# Patient Record
Sex: Male | Born: 1935
Health system: Southern US, Community
[De-identification: ages and names within clinical notes are randomized; demographics above are authoritative.]

## PROBLEM LIST (undated history)

## (undated) DIAGNOSIS — R011 Cardiac murmur, unspecified: Secondary | ICD-10-CM

## (undated) DIAGNOSIS — I10 Essential (primary) hypertension: Secondary | ICD-10-CM

## (undated) DIAGNOSIS — H6120 Impacted cerumen, unspecified ear: Secondary | ICD-10-CM

## (undated) DIAGNOSIS — I714 Abdominal aortic aneurysm, without rupture, unspecified: Secondary | ICD-10-CM

## (undated) DIAGNOSIS — R109 Unspecified abdominal pain: Secondary | ICD-10-CM

## (undated) DIAGNOSIS — H532 Diplopia: Secondary | ICD-10-CM

## (undated) DIAGNOSIS — B059 Measles without complication: Secondary | ICD-10-CM

## (undated) DIAGNOSIS — L609 Nail disorder, unspecified: Secondary | ICD-10-CM

## (undated) DIAGNOSIS — D649 Anemia, unspecified: Secondary | ICD-10-CM

## (undated) DIAGNOSIS — E7800 Pure hypercholesterolemia, unspecified: Secondary | ICD-10-CM

## (undated) DIAGNOSIS — Z87898 Personal history of other specified conditions: Secondary | ICD-10-CM

## (undated) DIAGNOSIS — F419 Anxiety disorder, unspecified: Secondary | ICD-10-CM

## (undated) DIAGNOSIS — B029 Zoster without complications: Secondary | ICD-10-CM

## (undated) DIAGNOSIS — N200 Calculus of kidney: Secondary | ICD-10-CM

## (undated) DIAGNOSIS — H9190 Unspecified hearing loss, unspecified ear: Secondary | ICD-10-CM

## (undated) DIAGNOSIS — L578 Other skin changes due to chronic exposure to nonionizing radiation: Secondary | ICD-10-CM

## (undated) DIAGNOSIS — M1990 Unspecified osteoarthritis, unspecified site: Secondary | ICD-10-CM

## (undated) DIAGNOSIS — A069 Amebiasis, unspecified: Secondary | ICD-10-CM

## (undated) DIAGNOSIS — I255 Ischemic cardiomyopathy: Secondary | ICD-10-CM

## (undated) DIAGNOSIS — N400 Enlarged prostate without lower urinary tract symptoms: Secondary | ICD-10-CM

## (undated) DIAGNOSIS — Z0000 Encounter for general adult medical examination without abnormal findings: Secondary | ICD-10-CM

## (undated) DIAGNOSIS — M109 Gout, unspecified: Secondary | ICD-10-CM

## (undated) DIAGNOSIS — Z8619 Personal history of other infectious and parasitic diseases: Secondary | ICD-10-CM

## (undated) DIAGNOSIS — K5730 Diverticulosis of large intestine without perforation or abscess without bleeding: Secondary | ICD-10-CM

## (undated) DIAGNOSIS — B269 Mumps without complication: Secondary | ICD-10-CM

## (undated) DIAGNOSIS — M545 Low back pain, unspecified: Secondary | ICD-10-CM

## (undated) DIAGNOSIS — I739 Peripheral vascular disease, unspecified: Secondary | ICD-10-CM

## (undated) DIAGNOSIS — K625 Hemorrhage of anus and rectum: Secondary | ICD-10-CM

## (undated) DIAGNOSIS — M25569 Pain in unspecified knee: Secondary | ICD-10-CM

## (undated) DIAGNOSIS — F411 Generalized anxiety disorder: Secondary | ICD-10-CM

## (undated) DIAGNOSIS — D126 Benign neoplasm of colon, unspecified: Secondary | ICD-10-CM

## (undated) DIAGNOSIS — K219 Gastro-esophageal reflux disease without esophagitis: Secondary | ICD-10-CM

## (undated) DIAGNOSIS — I2510 Atherosclerotic heart disease of native coronary artery without angina pectoris: Secondary | ICD-10-CM

## (undated) DIAGNOSIS — I5022 Chronic systolic (congestive) heart failure: Secondary | ICD-10-CM

## (undated) DIAGNOSIS — B019 Varicella without complication: Secondary | ICD-10-CM

## (undated) DIAGNOSIS — I25700 Atherosclerosis of coronary artery bypass graft(s), unspecified, with unstable angina pectoris: Secondary | ICD-10-CM

## (undated) HISTORY — DX: Benign prostatic hyperplasia without lower urinary tract symptoms: N40.0

## (undated) HISTORY — DX: Diverticulosis of large intestine without perforation or abscess without bleeding: K57.30

## (undated) HISTORY — DX: Benign neoplasm of colon, unspecified: D12.6

## (undated) HISTORY — PX: TONSILLECTOMY: SHX5217

## (undated) HISTORY — DX: Pure hypercholesterolemia, unspecified: E78.00

## (undated) HISTORY — DX: Impacted cerumen, unspecified ear: H61.20

## (undated) HISTORY — DX: Unspecified osteoarthritis, unspecified site: M19.90

## (undated) HISTORY — DX: Unspecified abdominal pain: R10.9

## (undated) HISTORY — DX: Gastro-esophageal reflux disease without esophagitis: K21.9

## (undated) HISTORY — DX: Ischemic cardiomyopathy: I25.5

## (undated) HISTORY — DX: Varicella without complication: B01.9

## (undated) HISTORY — DX: Essential (primary) hypertension: I10

## (undated) HISTORY — DX: Atherosclerotic heart disease of native coronary artery without angina pectoris: I25.10

## (undated) HISTORY — DX: Diplopia: H53.2

## (undated) HISTORY — DX: Low back pain, unspecified: M54.50

## (undated) HISTORY — DX: Mumps without complication: B26.9

## (undated) HISTORY — DX: Calculus of kidney: N20.0

## (undated) HISTORY — DX: Zoster without complications: B02.9

## (undated) HISTORY — PX: HEMORRHOID SURGERY: SHX153

## (undated) HISTORY — DX: Nail disorder, unspecified: L60.9

## (undated) HISTORY — DX: Personal history of other infectious and parasitic diseases: Z86.19

## (undated) HISTORY — DX: Hemorrhage of anus and rectum: K62.5

## (undated) HISTORY — DX: Pain in unspecified knee: M25.569

## (undated) HISTORY — DX: Other skin changes due to chronic exposure to nonionizing radiation: L57.8

## (undated) HISTORY — DX: Personal history of other specified conditions: Z87.898

## (undated) HISTORY — DX: Encounter for general adult medical examination without abnormal findings: Z00.00

## (undated) HISTORY — DX: Atherosclerosis of coronary artery bypass graft(s), unspecified, with unstable angina pectoris: I25.700

## (undated) HISTORY — DX: Measles without complication: B05.9

## (undated) HISTORY — DX: Unspecified hearing loss, unspecified ear: H91.90

## (undated) HISTORY — DX: Anemia, unspecified: D64.9

## (undated) HISTORY — DX: Chronic systolic (congestive) heart failure: I50.22

## (undated) HISTORY — DX: Amebiasis, unspecified: A06.9

## (undated) HISTORY — DX: Gout, unspecified: M10.9

## (undated) HISTORY — DX: Abdominal aortic aneurysm, without rupture, unspecified: I71.40

## (undated) HISTORY — DX: Abdominal aortic aneurysm, without rupture: I71.4

## (undated) HISTORY — DX: Low back pain: M54.5

## (undated) HISTORY — DX: Peripheral vascular disease, unspecified: I73.9

## (undated) HISTORY — DX: Generalized anxiety disorder: F41.1

## (undated) HISTORY — DX: Anxiety disorder, unspecified: F41.9

## (undated) HISTORY — PX: OTHER SURGICAL HISTORY: SHX169

---

## 1977-11-28 HISTORY — PX: CORONARY ARTERY BYPASS GRAFT: SHX141

## 1991-11-29 HISTORY — PX: CORONARY ARTERY BYPASS GRAFT: SHX141

## 1992-11-28 HISTORY — PX: INGUINAL HERNIA REPAIR: SUR1180

## 1994-11-28 HISTORY — PX: INGUINAL HERNIA REPAIR: SUR1180

## 2000-11-23 ENCOUNTER — Encounter: Payer: Self-pay | Admitting: Cardiology

## 2000-11-23 ENCOUNTER — Ambulatory Visit (HOSPITAL_COMMUNITY): Admission: RE | Admit: 2000-11-23 | Discharge: 2000-11-23 | Payer: Self-pay | Admitting: Cardiology

## 2004-10-12 ENCOUNTER — Ambulatory Visit: Payer: Self-pay

## 2005-02-07 ENCOUNTER — Ambulatory Visit: Payer: Self-pay | Admitting: Pulmonary Disease

## 2005-02-15 ENCOUNTER — Ambulatory Visit: Payer: Self-pay

## 2005-02-21 ENCOUNTER — Ambulatory Visit: Payer: Self-pay | Admitting: Pulmonary Disease

## 2005-04-18 ENCOUNTER — Ambulatory Visit: Payer: Self-pay | Admitting: Cardiology

## 2005-06-06 ENCOUNTER — Ambulatory Visit: Payer: Self-pay | Admitting: Cardiology

## 2005-10-05 ENCOUNTER — Ambulatory Visit: Payer: Self-pay | Admitting: Cardiology

## 2005-10-07 ENCOUNTER — Ambulatory Visit: Payer: Self-pay

## 2005-10-07 ENCOUNTER — Ambulatory Visit: Payer: Self-pay | Admitting: Cardiology

## 2005-12-29 ENCOUNTER — Ambulatory Visit: Payer: Self-pay | Admitting: Pulmonary Disease

## 2006-01-04 ENCOUNTER — Encounter: Admission: RE | Admit: 2006-01-04 | Discharge: 2006-01-04 | Payer: Self-pay | Admitting: Orthopedic Surgery

## 2006-01-23 ENCOUNTER — Ambulatory Visit: Payer: Self-pay | Admitting: Pulmonary Disease

## 2006-01-26 HISTORY — PX: OTHER SURGICAL HISTORY: SHX169

## 2006-02-01 ENCOUNTER — Ambulatory Visit: Payer: Self-pay | Admitting: Cardiology

## 2006-02-07 ENCOUNTER — Ambulatory Visit: Payer: Self-pay

## 2006-02-09 ENCOUNTER — Inpatient Hospital Stay (HOSPITAL_COMMUNITY): Admission: RE | Admit: 2006-02-09 | Discharge: 2006-02-11 | Payer: Self-pay | Admitting: Orthopedic Surgery

## 2006-03-01 ENCOUNTER — Ambulatory Visit: Payer: Self-pay | Admitting: Cardiology

## 2006-06-15 ENCOUNTER — Ambulatory Visit: Payer: Self-pay | Admitting: Cardiology

## 2006-06-28 ENCOUNTER — Ambulatory Visit: Payer: Self-pay | Admitting: Pulmonary Disease

## 2006-06-30 ENCOUNTER — Ambulatory Visit: Payer: Self-pay | Admitting: Pulmonary Disease

## 2006-08-07 ENCOUNTER — Ambulatory Visit: Payer: Self-pay | Admitting: Gastroenterology

## 2006-09-05 ENCOUNTER — Ambulatory Visit: Payer: Self-pay | Admitting: Cardiology

## 2006-09-07 ENCOUNTER — Ambulatory Visit: Payer: Self-pay | Admitting: Gastroenterology

## 2006-09-07 ENCOUNTER — Encounter (INDEPENDENT_AMBULATORY_CARE_PROVIDER_SITE_OTHER): Payer: Self-pay | Admitting: *Deleted

## 2006-10-04 ENCOUNTER — Ambulatory Visit: Payer: Self-pay

## 2006-10-04 ENCOUNTER — Ambulatory Visit: Payer: Self-pay | Admitting: Cardiology

## 2006-12-29 ENCOUNTER — Ambulatory Visit: Payer: Self-pay | Admitting: Pulmonary Disease

## 2007-01-26 ENCOUNTER — Ambulatory Visit: Payer: Self-pay | Admitting: Pulmonary Disease

## 2007-02-22 ENCOUNTER — Encounter: Admission: RE | Admit: 2007-02-22 | Discharge: 2007-02-22 | Payer: Self-pay | Admitting: Surgery

## 2007-03-01 ENCOUNTER — Ambulatory Visit: Payer: Self-pay | Admitting: Cardiology

## 2007-06-05 ENCOUNTER — Ambulatory Visit: Payer: Self-pay | Admitting: Cardiology

## 2007-06-05 LAB — CONVERTED CEMR LAB
Cholesterol: 99 mg/dL (ref 0–200)
Creatinine, Ser: 1 mg/dL (ref 0.4–1.5)
GFR calc Af Amer: 92 mL/min
GFR calc non Af Amer: 76 mL/min
Glucose, Bld: 96 mg/dL (ref 70–99)
Triglycerides: 103 mg/dL (ref 0–149)

## 2007-06-29 ENCOUNTER — Ambulatory Visit: Payer: Self-pay | Admitting: Pulmonary Disease

## 2007-09-17 ENCOUNTER — Ambulatory Visit: Payer: Self-pay | Admitting: Cardiology

## 2007-09-17 LAB — CONVERTED CEMR LAB
Basophils Relative: 0.6 % (ref 0.0–1.0)
Eosinophils Absolute: 0.1 10*3/uL (ref 0.0–0.6)
HCT: 38.9 % — ABNORMAL LOW (ref 39.0–52.0)
MCV: 98.5 fL (ref 78.0–100.0)
Monocytes Absolute: 0.6 10*3/uL (ref 0.2–0.7)
Monocytes Relative: 8.7 % (ref 3.0–11.0)
Neutro Abs: 4.4 10*3/uL (ref 1.4–7.7)
RDW: 13 % (ref 11.5–14.6)
WBC: 6.8 10*3/uL (ref 4.5–10.5)

## 2007-10-01 ENCOUNTER — Ambulatory Visit: Payer: Self-pay

## 2007-11-16 DIAGNOSIS — K219 Gastro-esophageal reflux disease without esophagitis: Secondary | ICD-10-CM | POA: Insufficient documentation

## 2007-11-16 DIAGNOSIS — I10 Essential (primary) hypertension: Secondary | ICD-10-CM

## 2007-11-16 DIAGNOSIS — Z87898 Personal history of other specified conditions: Secondary | ICD-10-CM

## 2007-11-16 DIAGNOSIS — I739 Peripheral vascular disease, unspecified: Secondary | ICD-10-CM

## 2007-11-16 DIAGNOSIS — M545 Low back pain, unspecified: Secondary | ICD-10-CM

## 2007-11-16 DIAGNOSIS — M1990 Unspecified osteoarthritis, unspecified site: Secondary | ICD-10-CM

## 2007-11-16 DIAGNOSIS — M109 Gout, unspecified: Secondary | ICD-10-CM

## 2007-11-16 DIAGNOSIS — E7800 Pure hypercholesterolemia, unspecified: Secondary | ICD-10-CM

## 2007-11-16 HISTORY — DX: Essential (primary) hypertension: I10

## 2007-11-16 HISTORY — DX: Unspecified osteoarthritis, unspecified site: M19.90

## 2007-11-16 HISTORY — DX: Peripheral vascular disease, unspecified: I73.9

## 2007-11-16 HISTORY — DX: Pure hypercholesterolemia, unspecified: E78.00

## 2007-11-16 HISTORY — DX: Personal history of other specified conditions: Z87.898

## 2007-11-16 HISTORY — DX: Low back pain, unspecified: M54.50

## 2007-11-16 HISTORY — DX: Gout, unspecified: M10.9

## 2007-11-19 ENCOUNTER — Ambulatory Visit: Payer: Self-pay | Admitting: Pulmonary Disease

## 2007-11-19 DIAGNOSIS — F411 Generalized anxiety disorder: Secondary | ICD-10-CM

## 2007-11-19 DIAGNOSIS — A069 Amebiasis, unspecified: Secondary | ICD-10-CM

## 2007-11-19 HISTORY — DX: Generalized anxiety disorder: F41.1

## 2007-11-19 HISTORY — DX: Amebiasis, unspecified: A06.9

## 2007-11-26 ENCOUNTER — Encounter: Payer: Self-pay | Admitting: Pulmonary Disease

## 2007-11-26 ENCOUNTER — Ambulatory Visit (HOSPITAL_COMMUNITY): Admission: RE | Admit: 2007-11-26 | Discharge: 2007-11-26 | Payer: Self-pay | Admitting: Pulmonary Disease

## 2007-11-30 LAB — CONVERTED CEMR LAB
CO2: 30 meq/L (ref 19–32)
Calcium: 9.8 mg/dL (ref 8.4–10.5)
Chloride: 101 meq/L (ref 96–112)
Creatinine, Ser: 1.1 mg/dL (ref 0.4–1.5)
Potassium: 4.3 meq/L (ref 3.5–5.1)
Total Protein: 7.1 g/dL (ref 6.0–8.3)

## 2007-12-17 ENCOUNTER — Telehealth (INDEPENDENT_AMBULATORY_CARE_PROVIDER_SITE_OTHER): Payer: Self-pay | Admitting: *Deleted

## 2008-01-01 ENCOUNTER — Ambulatory Visit: Payer: Self-pay | Admitting: Pulmonary Disease

## 2008-01-02 ENCOUNTER — Telehealth (INDEPENDENT_AMBULATORY_CARE_PROVIDER_SITE_OTHER): Payer: Self-pay | Admitting: *Deleted

## 2008-03-26 ENCOUNTER — Ambulatory Visit: Payer: Self-pay | Admitting: Cardiology

## 2008-07-01 ENCOUNTER — Ambulatory Visit: Payer: Self-pay | Admitting: Pulmonary Disease

## 2008-07-01 DIAGNOSIS — K5730 Diverticulosis of large intestine without perforation or abscess without bleeding: Secondary | ICD-10-CM | POA: Insufficient documentation

## 2008-07-01 DIAGNOSIS — D126 Benign neoplasm of colon, unspecified: Secondary | ICD-10-CM

## 2008-07-01 HISTORY — DX: Benign neoplasm of colon, unspecified: D12.6

## 2008-07-01 HISTORY — DX: Diverticulosis of large intestine without perforation or abscess without bleeding: K57.30

## 2008-07-04 ENCOUNTER — Ambulatory Visit: Payer: Self-pay | Admitting: Pulmonary Disease

## 2008-07-11 LAB — CONVERTED CEMR LAB
Albumin: 3.8 g/dL (ref 3.5–5.2)
Basophils Absolute: 0.1 10*3/uL (ref 0.0–0.1)
Basophils Relative: 1.1 % (ref 0.0–3.0)
Bilirubin, Direct: 0.2 mg/dL (ref 0.0–0.3)
GFR calc Af Amer: 82 mL/min
HCT: 41.2 % (ref 39.0–52.0)
HDL: 28.2 mg/dL — ABNORMAL LOW (ref >39.0)
Hemoglobin: 13.9 g/dL (ref 13.0–17.0)
LDL Cholesterol: 58 mg/dL (ref 0–99)
Lymphocytes Relative: 23.3 % (ref 12.0–46.0)
MCV: 99 fL (ref 78.0–100.0)
Monocytes Relative: 8.3 % (ref 3.0–12.0)
Platelets: 192 10*3/uL (ref 150–400)
RBC: 4.16 M/uL — ABNORMAL LOW (ref 4.22–5.81)
TSH: 2.34 microintl units/mL (ref 0.35–5.50)
Total Bilirubin: 1.3 mg/dL — ABNORMAL HIGH (ref 0.3–1.2)
Total CHOL/HDL Ratio: 3.6
Total Protein: 6.5 g/dL (ref 6.0–8.3)
Triglycerides: 73 mg/dL (ref 0–149)

## 2008-09-01 ENCOUNTER — Ambulatory Visit: Payer: Self-pay | Admitting: Pulmonary Disease

## 2008-09-23 ENCOUNTER — Encounter: Payer: Self-pay | Admitting: Pulmonary Disease

## 2008-10-28 ENCOUNTER — Ambulatory Visit: Payer: Self-pay

## 2008-10-28 ENCOUNTER — Ambulatory Visit: Payer: Self-pay | Admitting: Cardiology

## 2008-10-28 LAB — CONVERTED CEMR LAB
BUN: 17 mg/dL (ref 6–23)
Basophils Absolute: 0 10*3/uL (ref 0.0–0.1)
Basophils Relative: 0.7 % (ref 0.0–3.0)
CO2: 28 meq/L (ref 19–32)
Calcium: 9.2 mg/dL (ref 8.4–10.5)
Chloride: 103 meq/L (ref 96–112)
Creatinine, Ser: 1 mg/dL (ref 0.4–1.5)
Eosinophils Absolute: 0.2 10*3/uL (ref 0.0–0.7)
Eosinophils Relative: 3 % (ref 0.0–5.0)
GFR calc Af Amer: 92 mL/min
GFR calc non Af Amer: 76 mL/min
Glucose, Bld: 93 mg/dL (ref 70–99)
HCT: 40.9 % (ref 39.0–52.0)
Hemoglobin: 13.8 g/dL (ref 13.0–17.0)
Lymphocytes Relative: 22.9 % (ref 12.0–46.0)
MCHC: 33.8 g/dL (ref 30.0–36.0)
MCV: 99.9 fL (ref 78.0–100.0)
Monocytes Absolute: 0.5 10*3/uL (ref 0.1–1.0)
Monocytes Relative: 8.2 % (ref 3.0–12.0)
Neutro Abs: 4.2 10*3/uL (ref 1.4–7.7)
Neutrophils Relative %: 65.2 % (ref 43.0–77.0)
Platelets: 182 10*3/uL (ref 150–400)
Potassium: 4 meq/L (ref 3.5–5.1)
RBC: 4.09 M/uL — ABNORMAL LOW (ref 4.22–5.81)
RDW: 13.1 % (ref 11.5–14.6)
Sodium: 138 meq/L (ref 135–145)
WBC: 6.3 10*3/uL (ref 4.5–10.5)

## 2008-11-13 ENCOUNTER — Ambulatory Visit: Payer: Self-pay | Admitting: Cardiology

## 2008-11-28 LAB — HM COLONOSCOPY: HM Colonoscopy: NORMAL

## 2008-12-30 ENCOUNTER — Ambulatory Visit: Payer: Self-pay | Admitting: Pulmonary Disease

## 2009-01-05 ENCOUNTER — Telehealth: Payer: Self-pay | Admitting: Pulmonary Disease

## 2009-01-14 ENCOUNTER — Telehealth: Payer: Self-pay | Admitting: Pulmonary Disease

## 2009-01-15 ENCOUNTER — Telehealth (INDEPENDENT_AMBULATORY_CARE_PROVIDER_SITE_OTHER): Payer: Self-pay | Admitting: *Deleted

## 2009-01-15 ENCOUNTER — Encounter: Payer: Self-pay | Admitting: Pulmonary Disease

## 2009-03-25 ENCOUNTER — Encounter: Payer: Self-pay | Admitting: Pulmonary Disease

## 2009-05-22 ENCOUNTER — Telehealth: Payer: Self-pay | Admitting: Cardiology

## 2009-05-25 ENCOUNTER — Telehealth: Payer: Self-pay | Admitting: Pulmonary Disease

## 2009-05-27 ENCOUNTER — Telehealth: Payer: Self-pay | Admitting: Pulmonary Disease

## 2009-05-27 ENCOUNTER — Encounter: Payer: Self-pay | Admitting: Pulmonary Disease

## 2009-05-27 ENCOUNTER — Ambulatory Visit: Payer: Self-pay | Admitting: Cardiology

## 2009-06-02 ENCOUNTER — Telehealth: Payer: Self-pay | Admitting: Cardiology

## 2009-06-03 ENCOUNTER — Telehealth: Payer: Self-pay | Admitting: Cardiology

## 2009-07-01 ENCOUNTER — Ambulatory Visit: Payer: Self-pay | Admitting: Pulmonary Disease

## 2009-07-03 LAB — CONVERTED CEMR LAB
AST: 36 units/L (ref 0–37)
Alkaline Phosphatase: 73 units/L (ref 39–117)
Basophils Absolute: 0 10*3/uL (ref 0.0–0.1)
Calcium: 9.6 mg/dL (ref 8.4–10.5)
Chloride: 108 meq/L (ref 96–112)
Cholesterol: 106 mg/dL (ref 0–200)
GFR calc non Af Amer: 75.81 mL/min (ref >60)
HCT: 42.4 % (ref 39.0–52.0)
HDL: 36 mg/dL — ABNORMAL LOW (ref >39.00)
Hemoglobin: 14.4 g/dL (ref 13.0–17.0)
LDL Cholesterol: 60 mg/dL (ref 0–99)
Lymphocytes Relative: 22.3 % (ref 12.0–46.0)
Lymphs Abs: 1.4 10*3/uL (ref 0.7–4.0)
MCHC: 34 g/dL (ref 30.0–36.0)
Monocytes Relative: 8.7 % (ref 3.0–12.0)
Neutro Abs: 4.1 10*3/uL (ref 1.4–7.7)
Neutrophils Relative %: 65.9 % (ref 43.0–77.0)
Platelets: 218 10*3/uL (ref 150.0–400.0)
RBC: 4.33 M/uL (ref 4.22–5.81)
Total Bilirubin: 1 mg/dL (ref 0.3–1.2)
Total CHOL/HDL Ratio: 3
WBC: 6.1 10*3/uL (ref 4.5–10.5)

## 2009-07-06 ENCOUNTER — Telehealth (INDEPENDENT_AMBULATORY_CARE_PROVIDER_SITE_OTHER): Payer: Self-pay | Admitting: *Deleted

## 2009-09-23 ENCOUNTER — Encounter: Payer: Self-pay | Admitting: Pulmonary Disease

## 2009-10-07 ENCOUNTER — Encounter: Payer: Self-pay | Admitting: Pulmonary Disease

## 2009-10-21 ENCOUNTER — Encounter: Payer: Self-pay | Admitting: Pulmonary Disease

## 2009-10-26 ENCOUNTER — Encounter: Payer: Self-pay | Admitting: Pulmonary Disease

## 2009-11-04 ENCOUNTER — Ambulatory Visit: Payer: Self-pay

## 2009-11-04 ENCOUNTER — Encounter: Payer: Self-pay | Admitting: Cardiology

## 2009-11-04 DIAGNOSIS — I714 Abdominal aortic aneurysm, without rupture: Secondary | ICD-10-CM

## 2009-11-10 ENCOUNTER — Encounter (INDEPENDENT_AMBULATORY_CARE_PROVIDER_SITE_OTHER): Payer: Self-pay | Admitting: *Deleted

## 2009-11-11 ENCOUNTER — Ambulatory Visit: Payer: Self-pay | Admitting: Cardiology

## 2009-12-21 ENCOUNTER — Encounter: Payer: Self-pay | Admitting: Pulmonary Disease

## 2010-02-03 ENCOUNTER — Ambulatory Visit: Payer: Self-pay | Admitting: Pulmonary Disease

## 2010-02-03 ENCOUNTER — Encounter (INDEPENDENT_AMBULATORY_CARE_PROVIDER_SITE_OTHER): Payer: Self-pay | Admitting: *Deleted

## 2010-02-03 DIAGNOSIS — R109 Unspecified abdominal pain: Secondary | ICD-10-CM

## 2010-02-03 DIAGNOSIS — H6120 Impacted cerumen, unspecified ear: Secondary | ICD-10-CM | POA: Insufficient documentation

## 2010-02-03 HISTORY — DX: Unspecified abdominal pain: R10.9

## 2010-02-03 LAB — CONVERTED CEMR LAB
ALT: 28 units/L (ref 0–53)
AST: 33 units/L (ref 0–37)
Albumin: 4 g/dL (ref 3.5–5.2)
Alkaline Phosphatase: 71 units/L (ref 39–117)
CO2: 31 meq/L (ref 19–32)
Chloride: 111 meq/L (ref 96–112)
Creatinine, Ser: 1.1 mg/dL (ref 0.4–1.5)
Eosinophils Absolute: 0.3 10*3/uL (ref 0.0–0.7)
Eosinophils Relative: 3.9 % (ref 0.0–5.0)
GFR calc non Af Amer: 67.81 mL/min (ref >60)
Lymphocytes Relative: 25 % (ref 12.0–46.0)
MCV: 100.9 fL — ABNORMAL HIGH (ref 78.0–100.0)
Neutro Abs: 3.9 10*3/uL (ref 1.4–7.7)
Neutrophils Relative %: 60.1 % (ref 43.0–77.0)
RDW: 13.6 % (ref 11.5–14.6)
Sed Rate: 18 mm/hr (ref 0–22)
Sodium: 143 meq/L (ref 135–145)
Total Protein: 7.1 g/dL (ref 6.0–8.3)

## 2010-02-24 ENCOUNTER — Telehealth: Payer: Self-pay | Admitting: Gastroenterology

## 2010-02-26 ENCOUNTER — Ambulatory Visit: Payer: Self-pay | Admitting: Gastroenterology

## 2010-02-26 ENCOUNTER — Encounter (INDEPENDENT_AMBULATORY_CARE_PROVIDER_SITE_OTHER): Payer: Self-pay | Admitting: *Deleted

## 2010-03-02 ENCOUNTER — Telehealth: Payer: Self-pay | Admitting: Gastroenterology

## 2010-03-08 ENCOUNTER — Ambulatory Visit: Payer: Self-pay | Admitting: Gastroenterology

## 2010-04-29 ENCOUNTER — Ambulatory Visit: Payer: Self-pay | Admitting: Cardiology

## 2010-06-22 ENCOUNTER — Encounter: Payer: Self-pay | Admitting: Pulmonary Disease

## 2010-08-09 ENCOUNTER — Telehealth (INDEPENDENT_AMBULATORY_CARE_PROVIDER_SITE_OTHER): Payer: Self-pay | Admitting: *Deleted

## 2010-08-12 ENCOUNTER — Ambulatory Visit: Payer: Self-pay | Admitting: Pulmonary Disease

## 2010-11-09 ENCOUNTER — Telehealth: Payer: Self-pay | Admitting: Gastroenterology

## 2010-11-10 ENCOUNTER — Telehealth (INDEPENDENT_AMBULATORY_CARE_PROVIDER_SITE_OTHER): Payer: Self-pay | Admitting: *Deleted

## 2010-11-16 ENCOUNTER — Encounter: Payer: Self-pay | Admitting: Cardiology

## 2010-11-17 ENCOUNTER — Ambulatory Visit: Payer: Self-pay

## 2010-11-17 ENCOUNTER — Encounter: Payer: Self-pay | Admitting: Cardiology

## 2010-12-07 ENCOUNTER — Ambulatory Visit
Admission: RE | Admit: 2010-12-07 | Discharge: 2010-12-07 | Payer: Self-pay | Source: Home / Self Care | Attending: Gastroenterology | Admitting: Gastroenterology

## 2010-12-22 ENCOUNTER — Encounter: Payer: Self-pay | Admitting: Pulmonary Disease

## 2010-12-28 NOTE — Assessment & Plan Note (Signed)
History of Present Illness Visit Type: Initial Consult Primary GI MD: Rob Bunting MD Primary Provider: Alroy Dust, MD Chief Complaint: constipation and explosive diarrhea at times, dysphagia History of Present Illness:     very pleasant 77 year old man who has been having trouble with intermittent fecal urgency/incontinence at times.  This has been going on for about 6 months to 1 year.  This happens every other day.  He has not noticed bleeding.    This past weekend, he began with  Mucous discharge.  There was some bleeding.  Was constipated, had very scibbolous stools.  Since then he has had abnormal stools (size, shape different).  There is a groove on his stools about 1/8 inch deep, 3/16 inch wide.  He had some discomfort in lower pelvic area.  He had noted red blood on the TP the size of a postage stamp.  Overall his weight is stable (with some fluctuance).  Has never tried fiber supplements.    he had a basic set of labs including a CBC, basic metabolic profile, sedimentation rate and thyroid testing  one month ago and this was normal.           Current Medications (verified): 1)  Adult Aspirin Ec Low Strength 81 Mg  Tbec (Aspirin) .... Take 1 Tablet By Mouth Once A Day 2)  Benicar 20 Mg Tabs (Olmesartan Medoxomil) .... Take 1/2  Tab As Directed... 3)  Simvastatin 40 Mg  Tabs (Simvastatin) .... Take 1 Tablet By Mouth Once A Day 4)  Allopurinol 300 Mg  Tabs (Allopurinol) .... Take 1 Tablet By Mouth Once A Day 5)  Osteo Bi-Flex Adv Joint Shield   Tabs (Misc Natural Products) .... Take One Tablet By Mouth Two Times A Day 6)  Centrum Silver   Tabs (Multiple Vitamins-Minerals) .... Take 1 Tablet By Mouth Once A Day 7)  Nitrostat 0.4 Mg Subl (Nitroglycerin) .... As Directed... 8)  Folic Acid 400 Mcg Tabs (Folic Acid) .... Take 1 Tablet By Mouth Two Times A Day 9)  Avodart 0.5 Mg Caps (Dutasteride) .... Take As Directed By [name]...  Allergies (verified): 1)  !  Methocarbamol (Methocarbamol) 2)  ! Lyrica (Pregabalin) 3)  ! Lisinopril 4)  ! 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.4) HYPERTENSION (ICD-401.9) CAD (ICD-414.00) PERIPHERAL VASCULAR DISEASE (ICD-443.9) ABDOMINAL AORTIC ANEURYSM (ICD-441.4) HYPERCHOLESTEROLEMIA (ICD-272.0) GERD (ICD-530.81) DIVERTICULOSIS OF COLON (ICD-562.10) Hx of FLANK PAIN, RIGHT (ICD-789.09) Hx of AMEBIC DYSENTERY (ICD-006.9) BENIGN PROSTATIC HYPERTROPHY, HX OF (ICD-V13.8) DEGENERATIVE JOINT DISEASE (ICD-715.90) GOUT (ICD-274.9) BACK PAIN, LUMBAR (ICD-724.2) ANXIETY (ICD-300.00)  Past Surgical History: S/P CABG x 4 in 1979 S/P redo CABG x5 1993 by drWilson S/P right inguinal hernia repair in 1994 by DrGerkin S/P left inguinal hernia repair in 1996 by DrGerkin S/P decompressive laminectomy L2-sacrum 3/07 by [name]   Family History: Mother deceased age unknown---drowning Father deceased age unknown from old age 72 brother deceased age 80 from parkinsons disease 1 sister deceased age 31 from a fall from a building   Social History: retired quit smoking in 1946 2 drinks per day married 7 children  former [employer]. 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  All other review of systems was otherwise negative.   Vital Signs:  Patient profile:   77 year old male Height:  72 inches Weight:      177.50 pounds Pulse rate:   60 / minute Pulse rhythm:   regular BP sitting:   118 / 58  (left arm)  Vitals Entered By: Milford Cage NCMA (February 26, 2010 9:27 AM)  Physical Exam  Additional Exam:  Constitutional: generally well appearing Psychiatric: alert and oriented times 3 Eyes: extraocular movements intact Mouth: oropharynx moist, no lesions Neck: supple, no lymphadenopathy Cardiovascular: heart regular rate and  rythm Lungs: CTA bilaterally Abdomen: soft, non-tender, non-distended, no obvious ascites, no peritoneal signs, normal bowel sounds Extremities: no lower extremity edema bilaterally Skin: no lesions on visible extremities    Impression & Recommendations:  Problem # 1:  Change in bowel habits, constipation, intermittent fecal incontinence first he had a colonoscopy about 4 years ago. A polyp was described, removed by Dr. Victorino Dike biopsy showed this was not adenomatous or hyperplastic. It is not clear why he was recommended to have a repeat colonoscopy at 4 year interval.  that being said he has had fecal incontinence for about a year as well as a real change in his bowel habits just this past weekend. Perhaps backing off on caffeine has made a difference and cause him to be a bit more constipated recently. I think fiber supplements may help even out his bowels and I have recommended he start those. He should also undergo full colonoscopy at his soonest convenience.  Patient Instructions: 1)  You will be scheduled to have a colonoscopy. 2)  You should begin taking citrucel powder fiber supplement (orange flavor).  Start with a small spoonful and increase this over 1 week to a full, heaping spoonful daily.  You may notice some bloating when you first start the fiber, but that usually resolves after a few days. 3)  A copy of this information will be sent to Dr. Kriste Basque. 4)  The medication list was reviewed and reconciled.  All changed / newly prescribed medications were explained.  A complete medication list was provided to the patient / caregiver.  Appended Document: Orders Update/movi    Clinical Lists Changes  Problems: Added new problem of OTHER CONSTIPATION (ICD-564.09) Medications: Added new medication of MOVIPREP 100 GM  SOLR (PEG-KCL-NACL-NASULF-NA ASC-C) As per prep instructions. - Signed Rx of MOVIPREP 100 GM  SOLR (PEG-KCL-NACL-NASULF-NA ASC-C) As per prep instructions.;  #1 x 0;   Signed;  Entered by: Chales Abrahams CMA (AAMA);  Authorized by: Rachael Fee MD;  Method used: Electronically to [facility] [address] [place], [place], [place]  [address], Ph: [phone], Fax: [phone] Orders: Added new Test order of Colonoscopy (Colon) - Signed    Prescriptions: MOVIPREP 100 GM  SOLR (PEG-KCL-NACL-NASULF-NA ASC-C) As per prep instructions.  #1 x 0   Entered by:   Chales Abrahams CMA (AAMA)   Authorized by:   Rachael Fee MD   Signed by:   Chales Abrahams CMA (AAMA) on 02/26/2010   Method used:   Electronically to        [facility] (retail)       [address]       [place], [place]  [address]       Ph: [phone]       Fax: [phone]   RxID:   [id number]

## 2010-12-28 NOTE — Medication Information (Signed)
Summary: Benicar approval til 01/14/11/[employer]  Benicar approval til 01/14/11/[employer]   Imported By: Lester [place] 12/01/2009 10:22:21  _____________________________________________________________________  External Attachment:    Type:   Image     Comment:   External Document

## 2010-12-28 NOTE — Letter (Signed)
Summary: New Patient letter  [facility]  [address] [place], [place] [address]   Phone: [phone]  Fax: [phone]       02/03/2010 MRN: [id number]  Daniel Reeves [address] [place], [place]  [address]  Dear Mr. Pizzo,  Welcome to the Gastroenterology Division at [facility].    You are scheduled to see Dr.  Christella Hartigan on 03-03-10 at 2:30p.m. on the 3rd floor at [facility], [address]  We ask that you try to arrive at our office 15 minutes prior to your appointment time to allow for check-in.  We would like you to complete the enclosed self-administered evaluation form prior to your visit and bring it with you on the day of your appointment.  We will review it with you.  Also, please bring a complete list of all your medications or, if you prefer, bring the medication bottles and we will list them.  Please bring your insurance card so that we may make a copy of it.  If your insurance requires a referral to see a specialist, please bring your referral form from your primary care physician.  Co-payments are due at the time of your visit and may be paid by cash, check or credit card.     Your office visit will consist of a consult with your physician (includes a physical exam), any laboratory testing he/she may order, scheduling of any necessary diagnostic testing (e.g. x-ray, ultrasound, CT-scan), and scheduling of a procedure (e.g. Endoscopy, Colonoscopy) if required.  Please allow enough time on your schedule to allow for any/all of these possibilities.    If you cannot keep your appointment, please call [phone] to cancel or reschedule prior to your appointment date.  This allows [place] the opportunity to schedule an appointment for another patient in need of care.  If you do not cancel or reschedule by 5 p.m. the business day prior to your appointment date, you will be charged a $50.00 late cancellation/no-show fee.    Thank you for choosing  [facility] for your medical needs.  We appreciate the opportunity to care for you.  Please visit [place] at our website  to learn more about our practice.                     Sincerely,                                                             The Gastroenterology Division

## 2010-12-28 NOTE — Progress Notes (Signed)
Summary: Nitrostat refill request-lmomtcb x 1  Phone Note Call from Patient Call back at Home Phone [phone]   Caller: Patient Call For: nadel Summary of Call: Nitroquik refill requested - Rx expired in 05/11 - Pharmacy says they requested by fax twice already. [facility] [place] [facility] Initial call taken by: Eugene Gavia,  August 09, 2010 9:57 AM  Follow-up for Phone Call        [facility] x 1. Need to confirm medication and pharmacy with pt. Zackery Barefoot CMA  August 09, 2010 10:39 AM   Additional Follow-up for Phone Call Additional follow up Details #1::        Spoke with pt and notified rx was sent.  I advised that cards should refill this next time and pt verbalized understanding. Additional Follow-up by: Vernie Murders,  August 09, 2010 2:23 PM    Prescriptions: NITROSTAT 0.4 MG SUBL (NITROGLYCERIN) as directed...  #25 x 0   Entered by:   Vernie Murders   Authorized by:   Michele Mcalpine MD   Signed by:   Vernie Murders on 08/09/2010   Method used:   Electronically to        [facility] (retail)       [address]       [place], [place]  [address]       Ph: [phone]       Fax: [phone]   RxID:   [id number]

## 2010-12-28 NOTE — Progress Notes (Signed)
Summary: Triage  Phone Note Call from Patient Call back at Home Phone [phone]   Caller: Patient Call For: Dr. Christella Hartigan Reason for Call: Talk to Nurse Summary of Call: pt going on 3day/500 mile trip the day after procedure on 03-08-10. Wants to know if it will be okay to travel that distance after procedure Initial call taken by: Karna Christmas,  March 02, 2010 9:56 AM  Follow-up for Phone Call        pt advised he could go on his trip, he should be fine for travel barring any complications during the procedure Follow-up by: Chales Abrahams CMA Duncan Dull),  March 02, 2010 10:09 AM

## 2010-12-28 NOTE — Assessment & Plan Note (Signed)
Summary: f/u 6 months///kp   CC:  7 month ROV & review of mult medical problems....  History of Present Illness: 77 y/o WM here for a follow up visit... he has multiple medical problems as noted below...    ~  Aug09:  he has been doing reasonably well without new complaints or concerns except for some dysphagia- he notes difficulty swallowing large pills eg- Glucosamine... I offered him a GI referral for EGD but he declines at present... he will try other strategies including cutting up his larger pills, etc...  ~  Oct09:  saw [name] w/ f/u PSA in the 8-10 range & improved by elim white foods from his diet.  ~  Dec09:  saw [name] for f/u LBBB, CAD- s/p CABG, AAA, Chol... doing well, same meds...   ~  December 30, 2008:  he has several complaints today- 1) nocturnal leg cramps: he's tried several OTC meds w/o relief... we discussed trial of Tonic water in increasing amts, &/or Mustard...  2) IBS acting up & he has fecal incontinence- he is quite sure that "my striangulated intestines shift & block my rectum"... I offered him a GI referral for further eval, vs trial of Miralax + Senakot-S (he prefers the latter).   ~  July 01, 2009:  he's had a good 4mo "couldn't be better" he says... he fell several weeks ago in his garden & injured left ribs- CXR at [facility] was neg- post CABG, Ao calcif, clear lungs... he last saw [name] 12/09- doing well, no changes made... he notes sl dizzy from the Benicar, therefore he decr to 1/2 tab & monitors his BP at home... he is followed by [name] every 4mo w/ rising PSA on watchful waiting protocol... no new complaints or concerns...   ~  February 03, 2010:  he states that "I'm falling apart"- c/o DOE w/ min activity yet doing yard work & just ret from [employer]... his BP is sl lower than usual on Benicar20/d & we discussed decr to 1/2 tab...  he saw [name] 12/10- doing well, no changes made... he saw [name] 8/10 & 1/11- started on AVODART for incr PSA & it  dropped from 11.7 to 4.3 on the med but c/o fatigue, weak, breast tenderness etc... they decided to decr to Qod, now Q3rd day, & feeling sl better... we discussed checking labs today for completeness...  also c/o ?glass shard in finger but I couldn't detect foreing body- he will hot soak it & try to work it out vs call DrSypher for check... finally he is c/o mucous in his stool & fecal incontinence (see above)- prev offered GI referral & he is ready for this now...   Current Problem List:  HYPERTENSION (ICD-401.9) - on BENICAR 20mg /d at present... BP today= 112/58, he monitors BP at home & we discussed decr to 1/2 tab daily... denies HA, visual changes, CP, palipit, dizziness, syncope, edema, etc...   CAD (ICD-414.00) - he takes ASA 81mg /d... followed by [name] w/ hx of 2 prev CABG's in 1979 & 1993...   ~  NuclearStressTest 3/07 showed infer infarct & mild peri-infarct ischemia, abn wall motion & EF=45%... essentially no change from 2003  ~  last check up by [name] was 12/10- [facility], doing [facility]is, no changes made.  PERIPHERAL VASCULAR DISEASE (ICD-443.9) - he takes the Aspirin 81mg /d... known  ~3cm AAA being followed serially...  ~  ABI's 3/06 were normal   ~  CT Abd 12/08  w/ 3.3cm focal saccular AAA in distal abd ao. ((it was 3cm in 05)...  ~  AbdAo Doppler 11/08 w/ stable infra-renal fusiform aneurysm  ~ 3cm...  ~  AbdAo Doppler 12/10 showed stable  ~3cm AAA, mod irreg plaque, norm iliacs... f/u 43yr.  HYPERCHOLESTEROLEMIA (ICD-272.0) - on SIMVASTATIN 40mg /d...  ~  FLP 7/08 showed TChol 99, TG 103, HDL 32, LDL 46  ~  FLP 8/09 showed TChol 101, TG 73, HDL 28, LDL 58  ~  FLP 8/10 showed TChol 106, TG 50, HDL 36, LDL 60  GERD (ICD-530.81) - uses OTC meds as needed... last EGD 10/07 by DrSam showed sm HH otherw neg...   ~ 8/09: noted some dysphagia for large pills, offered GI referral but he declined.  DIVERTICULOSIS OF COLON (ICD-562.10) & COLONIC POLYPS (ICD-211.3)  ~   colonoscopy 10/07 by DrSam showed divertics, and 7mm polyp= polypoid mucosa on path... f/u 52yrs planned...  ~  2/10: noted some IBS symptoms and fecal incontinence, again offered GI referral but he declined.  ~  3/11: now he is ready to discuss fecal incontinence w/ gastroenterology...  FLANK PAIN, RIGHT (ICD-789.09) - he was seen 12/08 c/o Rt flank pain of 6 months duration... he had seen [name] and was unhappy w/ his eval, concerned about kidney stone vs recurrence of amebic dysentery... 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 BoneScan= arthritic uptake all over but none in rt flank area, ribs, etc...  Rx w/ heating pad and ETODOLAC... he reports that his discomfort pretty much resolved and he felt good about the thorough evaluation...  ~  8/10:  prev right flank pain resolved- no recurrence.  Hx of AMEBIC DYSENTERY (ICD-006.9)  BENIGN PROSTATIC HYPERTROPHY, HX OF (ICD-V13.8) - hx of increased PSA in the 6-8 range and followed by [name]... he has LTOS including the fact that it takes him 12 min to urinate and empty satisfactorily... he has nocturia x 2-3... he tells me that [name] didn't want to place him on meds, rather he rec a diet for his prostate health w/ fruits + vegetables, low fat, & no white foods like rice etc... his voiding symptoms are improved on this diet, he says... pt has refused cysto & bx of prostate, they are following his PSA's Q24mo.  ~  2010: [name] started Avodart when PSA hit 11.7 & it improved to 4.3, but pt c/o fatigue, weak, breast tenderness- so they are decreasing the Avodart to Qod, now Q3rd day (w/ sl improvement in symptoms).  DEGENERATIVE JOINT DISEASE (ICD-715.90) - he takes OSTEOBIFLEX Bid, + MVI, etc... he manages his mod severe arthritis pain very well and only uses an occas Advil... prev had Etodolac, Tramadol,  DCN100- but stopped all of these...  GOUT (ICD-274.9) - on ALLOPURINOL 300mg /d...   ~  labs 2/09 showed Uric = 3.8  ~  labs 8/10 showed Uric = 3.8  BACK PAIN, LUMBAR (ICD-724.2) - s/p decompressive laminectomy 3/07 by [name]...  ANXIETY (ICD-300.00)   Allergies: 1)  ! Methocarbamol (Methocarbamol) 2)  ! Lyrica (Pregabalin) 3)  ! Lisinopril 4)  ! Ramipril  Comments:  Nurse/Medical Assistant: The patient's medications and allergies were reviewed with the patient and were updated in the Medication and Allergy Lists.  Past History:  Past Medical History:  Hx of CERUMEN IMPACTION, BILATERAL (ICD-380.4) HYPERTENSION (ICD-401.9) CAD (ICD-414.00) PERIPHERAL VASCULAR DISEASE (ICD-443.9) ABDOMINAL  AORTIC ANEURYSM (ICD-441.4) HYPERCHOLESTEROLEMIA (ICD-272.0) GERD (ICD-530.81) DIVERTICULOSIS OF COLON (ICD-562.10) COLONIC POLYPS (ICD-211.3) Hx of FLANK PAIN, RIGHT (ICD-789.09) Hx of AMEBIC DYSENTERY (ICD-006.9) BENIGN PROSTATIC HYPERTROPHY, HX OF (ICD-V13.8) DEGENERATIVE JOINT DISEASE (ICD-715.90) GOUT (ICD-274.9) BACK PAIN, LUMBAR (ICD-724.2) ANXIETY (ICD-300.00)  Past Surgical History: S/P CABG x 4 in 1979 S/P redo CABG x5 1993 by drWilson S/P right inguinal hernia repair in 1994 by DrGerkin S/P left inguinal hernia repair in 1996 by DrGerkin S/P decompressive laminectomy L2-sacrum 3/07 by [name]  Family History: Reviewed history from 11/05/2009 and no changes required. Mother deceased age Suzanne Boron Father deceased age unknown from old age 61 brother deceased age 42 from parkinsons disease 1 sister deceased age 22 from a fall from a building  Social History: Reviewed history from 11/05/2009 and no changes required. retired quit smoking in 1946 2 drinks per day married 7 children  Review of Systems      See HPI       The patient complains of decreased hearing, dyspnea on exertion, and muscle weakness.  The patient denies anorexia, fever,  weight loss, weight gain, vision loss, hoarseness, chest pain, syncope, peripheral edema, prolonged cough, headaches, hemoptysis, abdominal pain, melena, hematochezia, severe indigestion/heartburn, hematuria, incontinence, suspicious skin lesions, transient blindness, difficulty walking, depression, unusual weight change, abnormal bleeding, enlarged lymph nodes, and angioedema.    Vital Signs:  Patient profile:   77 year old male Height:      72 inches Weight:      182.13 pounds O2 Sat:      95 % on Room air Temp:     97.5 degrees F oral Pulse rate:   59 / minute BP sitting:   112 / 58  (left arm) Cuff size:   regular  Vitals Entered By: Randell Loop CMA (February 03, 2010 2:26 PM)  O2 Sat at Rest %:  95 O2 Flow:  Room air CC: 7 month ROV & review of mult medical problems... Is Patient Diabetic? No Pain Assessment Patient in pain? yes      Comments meds updated today   Physical Exam  Additional Exam:  WD, WN, 77 y/o WM in NAD... GENERAL:  Alert & oriented; pleasant & cooperative... HEENT:  [place]/AT, EOM-wnl, PERRLA, EACs-clear, TMs-wnl, NOSE-clear, THROAT-clear & wnl. NECK:  Supple w/ fairROM; no JVD; normal carotid impulses w/o bruits; no thyromegaly or nodules palpated; no lymphadenopathy. CHEST:  Clear to P & A; without wheezes/ rales/ or rhonchi. HEART:  Regular Rhythm; gr 1/6 SEM, w/o rubs or gallops heard... ABDOMEN:  Soft & nontender; normal bowel sounds; no organomegaly or masses detected. EXT: without deformities, mod arthritic changes (esp hands); no varicose veins/ venous insuffic/ or edema. NEURO:  CN's intact; no focal neuro deficits... DERM:  No lesions noted; no rash etc... can't ident glass splinter in finger- he will hot soak it & observe.    MISC. Report  Procedure date:  02/03/2010  Findings:      BMP (METABOL)   Sodium                    143 mEq/L                   135-145   Potassium                 4.3 mEq/L                   3.5-5.1   Chloride  111 mEq/L                   96-112   Carbon Dioxide            31 mEq/L                    19-32   Glucose              [H]  106 mg/dL                   21-30   BUN                       21 mg/dL                    8-65   Creatinine                1.1 mg/dL                   7.8-4.6   Calcium                   9.8 mg/dL                   9.6-29.5   GFR                       67.81 mL/min                >60  Hepatic/Liver Function Panel (HEPATIC)   Total Bilirubin           1.0 mg/dL                   2.8-4.1   Direct Bilirubin          0.2 mg/dL                   3.2-4.4   Alkaline Phosphatase      71 U/L                      39-117   AST                       33 U/L                      0-37   ALT                       28 U/L                      0-53   Total Protein             7.1 g/dL                    0.1-0.2   Albumin                   4.0 g/dL                    7.2-5.3  CBC Platelet w/Diff (CBCD)   White Cell Count          6.7 K/uL                    4.5-10.5   Red Cell Count       [  L]  3.98 Mil/uL                 4.22-5.81   Hemoglobin                13.5 g/dL                   16.1-09.6   Hematocrit                40.1 %                      39.0-52.0   MCV                  [H]  100.9 fl                    78.0-100.0   Platelet Count            188.0 K/uL                  150.0-400.0   Neutrophil %              60.1 %                      43.0-77.0   Lymphocyte %              25.0 %                      12.0-46.0   Monocyte %                10.2 %                      3.0-12.0   Eosinophils%              3.9 %                       0.0-5.0   Basophils %               0.8 %                       0.0-3.0  TSH (TSH)   FastTSH                   3.69 uIU/mL                 0.35-5.50  Sed Rate (ESR)   Sed Rate                  18 mm/hr       Impression & Recommendations:  Problem # 1:  HYPERTENSION (ICD-401.9) He has mult somatic complaints-  we will decr the  BENICAR to 1/2 tab daily & monitor BP... His updated medication list for this problem includes:    Benicar 20 Mg Tabs (Olmesartan medoxomil) .[name]... Take 1/2  tab as directed...  Orders: TLB-BMP (Basic Metabolic Panel-BMET) (80048-METABOL) TLB-Hepatic/Liver Function Pnl (80076-HEPATIC) TLB-CBC Platelet - w/Differential (85025-CBCD) TLB-TSH (Thyroid Stimulating Hormone) (84443-TSH) TLB-Sedimentation Rate (ESR) (85652-ESR)  Problem # 2:  CAD (ICD-414.00) Followed by [name] & stable-  same meds. His updated medication list for this problem includes:    Adult Aspirin Ec Low Strength 81 Mg Tbec (Aspirin) .[name]... Take 1 tablet by mouth once a day    Benicar 20 Mg Tabs (Olmesartan medoxomil) .[name]... Take 1/2  tab as directed...    Nitrostat 0.4 Mg Subl (Nitroglycerin) .[name]... As directed...  Problem # 3:  PERIPHERAL VASCULAR DISEASE (ICD-443.9) He has a 3cm AAA which is followed by [name] & stable on Ao Ao Sonar...  Problem # 4:  HYPERCHOLESTEROLEMIA (ICD-272.0) Stable on the Somva40 + diet... His updated medication list for this problem includes:    Simvastatin 40 Mg Tabs (Simvastatin) .[name]... Take 1 tablet by mouth once a day  Problem # 5:  DIVERTICULOSIS OF COLON (ICD-562.10) Hx mult GIproblems from GERD & difficulty w/ large pills;  to Divertics/ colon polyps/ hx amebic dysentary and c/o fecal incontinence... we will refer to GI for further eval... Orders: Gastroenterology Referral (GI)  Problem # 6:  BENIGN PROSTATIC HYPERTROPHY, HX OF (ICD-V13.8) Hx BPH & BOO symptoms-  elevated PSA w/ Rx by [name] on AVODART... he has mult somatic complaints- ?Avodart related and he has been decreasing the dose slowly- now on one every 3rd day...   Problem # 7:  DEGENERATIVE JOINT DISEASE (ICD-715.90) Mod severe DJD esp hands but he refuses pain Rx... now w/ ?splinter? in finger which I cannot find... he will hot soak it & try to express the splinter if there is one & call if symptoms persist for  referral to hand clinic... His updated medication list for this problem includes:    Adult Aspirin Ec Low Strength 81 Mg Tbec (Aspirin) .[name]... Take 1 tablet by mouth once a day  Problem # 8:  OTHER MEDICAL PROBLEMS AS NOTED>>> Labs today returned looking OK- WNL.Marland KitchenMarland Kitchen  Complete Medication List: 1)  Adult Aspirin Ec Low Strength 81 Mg Tbec (Aspirin) .... Take 1 tablet by mouth once a day 2)  Benicar 20 Mg Tabs (Olmesartan medoxomil) .... Take 1/2  tab as directed... 3)  Simvastatin 40 Mg Tabs (Simvastatin) .... Take 1 tablet by mouth once a day 4)  Allopurinol 300 Mg Tabs (Allopurinol) .... Take 1 tablet by mouth once a day 5)  Osteo Bi-flex Adv Joint Shield Tabs (Misc natural products) .... Take one tablet by mouth two times a day 6)  Centrum Silver Tabs (Multiple vitamins-minerals) .... Take 1 tablet by mouth once a day 7)  Nitrostat 0.4 Mg Subl (Nitroglycerin) .... As directed... 8)  Folic Acid 400 Mcg Tabs (Folic acid) .... Take 1 tablet by mouth two times a day 9)  Avodart 0.5 Mg Caps (Dutasteride) .... Take as directed by drwrenn...  Patient Instructions: 1)  Today we updated your med list- see below.... 2)  We decided to decrease the BENICAR to 1/2 tab daily.Marland KitchenMarland Kitchen 3)  We will arrange for a GI consultation w/ [name]... 4)  Today we did your follow up non-fasting blood work... please call the "phone tree" in a few days for your lab results.Marland KitchenMarland Kitchen 5)  Call for any questions.Marland KitchenMarland Kitchen 6)  Please schedule a follow-up appointment in 6 months.

## 2010-12-28 NOTE — Letter (Signed)
Summary: [facility] Instructions  [facility]  [address] [place], [place] [address]   Phone: [phone]  Fax: [phone]       Daniel Reeves    12/12/1923    MRN: [id number]        Procedure Day /Date:03/08/10  MON     Arrival Time:1 pm     Procedure Time:2 pm     Location of Procedure:                    X  [facility] (4th Floor)                        PREPARATION FOR COLONOSCOPY WITH MOVIPREP   Starting 5 days prior to your procedure 03/03/10 do not eat nuts, seeds, popcorn, corn, beans, peas,  salads, or any raw vegetables.  Do not take any fiber supplements (e.g. Metamucil, Citrucel, and Benefiber).  THE DAY BEFORE YOUR PROCEDURE         DATE: 03/07/10  DAY: SUN  1.  Drink clear liquids the entire day-NO SOLID FOOD  2.  Do not drink anything colored red or purple.  Avoid juices with pulp.  No orange juice.  3.  Drink at least 64 oz. (8 glasses) of fluid/clear liquids during the day to prevent dehydration and help the prep work efficiently.  CLEAR LIQUIDS INCLUDE: Water Jello Ice Popsicles Tea (sugar ok, no milk/cream) Powdered fruit flavored drinks Coffee (sugar ok, no milk/cream) Gatorade Juice: apple, white grape, white cranberry  Lemonade Clear bullion, consomm, broth Carbonated beverages (any kind) Strained chicken noodle soup Hard Candy                             4.  In the morning, mix first dose of MoviPrep solution:    Empty 1 Pouch A and 1 Pouch B into the disposable container    Add lukewarm drinking water to the top line of the container. Mix to dissolve    Refrigerate (mixed solution should be used within 24 hrs)  5.  Begin drinking the prep at 5:00 p.m. The MoviPrep container is divided by 4 marks.   Every 15 minutes drink the solution down to the next mark (approximately 8 oz) until the full liter is complete.   6.  Follow completed prep with 16 oz of clear liquid of your choice (Nothing red or purple).   Continue to drink clear liquids until bedtime.  7.  Before going to bed, mix second dose of MoviPrep solution:    Empty 1 Pouch A and 1 Pouch B into the disposable container    Add lukewarm drinking water to the top line of the container. Mix to dissolve    Refrigerate  THE DAY OF YOUR PROCEDURE      DATE: 03/08/10 DAY: MON  Beginning at 9 a.m. (5 hours before procedure):         1. Every 15 minutes, drink the solution down to the next mark (approx 8 oz) until the full liter is complete.  2. Follow completed prep with 16 oz. of clear liquid of your choice.    3. You may drink clear liquids until 12 noon (2 HOURS BEFORE PROCEDURE).   MEDICATION INSTRUCTIONS  Unless otherwise instructed, you should take regular prescription medications with a small sip of water   as early as possible the morning of your procedure.  OTHER INSTRUCTIONS  You will need a responsible adult at least 75 years of age to accompany you and drive you home.   This person must remain in the waiting room during your procedure.  Wear loose fitting clothing that is easily removed.  Leave jewelry and other valuables at home.  However, you may wish to bring a book to read or  an iPod/MP3 player to listen to music as you wait for your procedure to start.  Remove all body piercing jewelry and leave at home.  Total time from sign-in until discharge is approximately 2-3 hours.  You should go home directly after your procedure and rest.  You can resume normal activities the  day after your procedure.  The day of your procedure you should not:   Drive   Make legal decisions   Operate machinery   Drink alcohol   Return to work  You will receive specific instructions about eating, activities and medications before you leave.    The above instructions have been reviewed and explained to me by   _______________________    I fully understand and can verbalize these instructions  _____________________________ Date _________

## 2010-12-28 NOTE — Assessment & Plan Note (Signed)
Summary: f34m   Visit Type:  6 month follow up Referring Provider:  Alroy Dust, MD Primary Provider:  Alroy Dust, MD  CC:  no complaints.  History of Present Illness: Patient continuing to do well from a cardiac standpoint.  Has had trouble swallowing at times, and also has had a little bit of bleeding.  Had a recent colonoscopy.  Walking is a problem, especially on hills.   Current Medications (verified): 1)  Adult Aspirin Ec Low Strength 81 Mg  Tbec (Aspirin) .... Take 1 Tablet By Mouth Once A Day 2)  Benicar 20 Mg Tabs (Olmesartan Medoxomil) .... Take 1/2 Tablet Once A Day 3)  Simvastatin 40 Mg  Tabs (Simvastatin) .... Take 1 Tablet By Mouth Once A Day 4)  Allopurinol 300 Mg  Tabs (Allopurinol) .... Take 1 Tablet By Mouth Once A Day 5)  Osteo Bi-Flex Adv Joint Shield   Tabs (Misc Natural Products) .... Take One Tablet By Mouth Two Times A Day 6)  Centrum Silver   Tabs (Multiple Vitamins-Minerals) .... Take 1 Tablet By Mouth Once A Day 7)  Nitrostat 0.4 Mg Subl (Nitroglycerin) .... As Directed... 8)  Folic Acid 400 Mcg Tabs (Folic Acid) .... Take 1 Tablet By Mouth Two Times A Day 9)  Avodart 0.5 Mg Caps (Dutasteride) .... Take As Directed By [name]... Every Third Day 10)  Citracal Powder .... Once Daily  Allergies: 1)  ! Methocarbamol (Methocarbamol) 2)  ! Lyrica (Pregabalin) 3)  ! Lisinopril 4)  ! Ramipril  Vital Signs:  Patient profile:   77 year old male Height:      72 inches Weight:      176 pounds BMI:     23.96 Pulse rate:   54 / minute Pulse rhythm:   regular Resp:     18 per minute BP sitting:   114 / 60  (left arm) Cuff size:   large  Vitals Entered By: Vikki Ports (April 29, 2010 12:07 PM)  Physical Exam  General:  Well developed, well nourished, in no acute distress. Head:  normocephalic and atraumatic Eyes:  PERRLA/EOM intact; conjunctiva and lids normal. Lungs:  Clear bilaterally to auscultation and percussion. Heart:  Normal S1.  Paradoxical S2.   Minimal SEM.   Abdomen:  Bowel sounds positive; abdomen soft and non-tender without masses, organomegaly, or hernias noted. No hepatosplenomegaly. Extremities:  No clubbing or cyanosis. Neurologic:  Alert and oriented x 3.   EKG  Procedure date:  04/29/2010  Findings:      SB.  LBBB.  Impression & Recommendations:  Problem # 1:  CAD (ICD-414.00)  NO recurrent symptoms.  No angina His updated medication list for this problem includes:    Adult Aspirin Ec Low Strength 81 Mg Tbec (Aspirin) .[name]... Take 1 tablet by mouth once a day    Nitrostat 0.4 Mg Subl (Nitroglycerin) .[name]... As directed...  Orders: EKG w/ Interpretation (93000)  Problem # 2:  HYPERTENSION (ICD-401.9)  BP controlled.  His updated medication list for this problem includes:    Adult Aspirin Ec Low Strength 81 Mg Tbec (Aspirin) .[name]... Take 1 tablet by mouth once a day    Benicar 20 Mg Tabs (Olmesartan medoxomil) .[name]... Take 1/2 tablet once a day  Orders: EKG w/ Interpretation (93000)  Problem # 3:  ABDOMINAL AORTIC ANEURYSM (ICD-441.4) 3.1 by 3.1 in December 2010.  Repeat in December.  Problem # 4:  HYPERCHOLESTEROLEMIA (ICD-272.0) LDL at target August 2010.  Follow up Dr. Kriste Basque His updated  medication list for this problem includes:    Simvastatin 40 Mg Tabs (Simvastatin) .[name]... Take 1 tablet by mouth once a day  Patient Instructions: 1)  Your physician recommends that you continue on your current medications as directed. Please refer to the Current Medication list given to you today. 2)  Your physician wants you to follow-up in: 1 YEAR.   You will receive a reminder letter in the mail two months in advance. If you don't receive a letter, please call our office to schedule the follow-up appointment. 3)  Your physician has requested that you have an abdominal aorta duplex in DECEMBER. During this test, an ultrasound is used to evaluate the aorta. Allow 30 minutes for this exam. Do not eat after midnight the day  before and avoid carbonated beverages. There are no restrictions or special instructions.

## 2010-12-28 NOTE — Letter (Signed)
Summary: [facility]  [facility]   Imported By: Sherian Rein 01/04/2010 08:34:01  _____________________________________________________________________  External Attachment:    Type:   Image     Comment:   External Document

## 2010-12-28 NOTE — Progress Notes (Signed)
Summary: triage  Phone Note Call from Patient Call back at Home Phone [phone]   Caller: Patient Call For: Christella Hartigan Reason for Call: Talk to Nurse Summary of Call: Patient has a lot pain in his lower abd and a lot of pressure and the blockage is incresingly severe, wants to be seen sooner than his appt date 4-6 Initial call taken by: Tawni Levy,  February 24, 2010 9:41 AM  Follow-up for Phone Call        left message on machine to call back Chales Abrahams CMA Duncan Dull)  February 24, 2010 10:32 AM   pt returned call and has been scheduled for 02/26/10.  Follow-up by: Chales Abrahams CMA Duncan Dull),  February 24, 2010 12:33 PM

## 2010-12-28 NOTE — Procedures (Signed)
Summary: Colonoscopy  Patient: Daniel Reeves Note: All result statuses are Final unless otherwise noted.  Tests: (1) Colonoscopy (COL)   COL Colonoscopy           DONE     [facility]     [address]     [place], [place]  [address]           COLONOSCOPY PROCEDURE REPORT     PATIENT:  Daniel Reeves, Daniel Reeves  MR#:  [id number]     BIRTHDATE:  1924-06-25, 85 yrs. old  GENDER:  male     ENDOSCOPIST:  Rachael Fee, MD           PROCEDURE DATE:  03/08/2010     PROCEDURE:  Diagnostic Colonoscopy     ASA CLASS:  Class II     INDICATIONS:  change in bowel habits, intermittent fecal     inconcitence     MEDICATIONS:   Fentanyl 50 mcg IV, Versed 5 mg IV     DESCRIPTION OF PROCEDURE:   After the risks benefits and     alternatives of the procedure were thoroughly explained, informed     consent was obtained.  Digital rectal exam was performed and     revealed no rectal masses.   The LB [id number] endoscope     was introduced through the anus and advanced to the cecum, which     was identified by both the appendix and ileocecal valve, without     limitations.  The quality of the prep was good, using MoviPrep.     The instrument was then slowly withdrawn as the colon was fully     examined.     <<PROCEDUREIMAGES>>     FINDINGS:  Moderate to severe diverticulosar changes were found in     the sigmoid to descending colon segments. There was significant     associated peridiverticular edema, tortuosity of colon (see     image4, image5, and image6).  This was otherwise a normal     examination of the colon (see image7, image2, and image3).     Retroflexed views in the rectum revealed no abnormalities.    The     scope was then withdrawn from the patient and the procedure     completed.     COMPLICATIONS:  None     ENDOSCOPIC IMPRESSION:     1) Moderate diverticulosis in the sigmoid to descending colon     segments.  This segment of colon could be causing, contributing to  his bowel changes recently. There has been no sign of acute     infection, however.     2) Otherwise normal examination; no polyps or cancers           RECOMMENDATIONS:     He will resume fiber supplements (full scoop of citrucel daily)     and will return to see Dr. Christella Hartigan in the office if symptoms     persist or do not improve after 3-4 weeks of daily fiber.           ______________________________     Rachael Fee, MD           cc: Alroy Dust, MD           n.     eSIGNED:   Rachael Fee at 03/08/2010 02:18 PM           Deon Pilling, [phone]  Note: An exclamation mark (!) indicates a  result that was not dispersed into the flowsheet. Document Creation Date: 03/08/2010 2:19 PM _______________________________________________________________________  (1) Order result status: Final Collection or observation date-time: 03/08/2010 14:13 Requested date-time:  Receipt date-time:  Reported date-time:  Referring Physician:   Ordering Physician: Rob Bunting [id number]) Specimen Source:  Source: Launa Grill Order Number: [id number] Lab site:

## 2010-12-28 NOTE — Procedures (Signed)
Summary: EGD   EGD  Procedure date:  09/07/2006  Findings:      Location: [facility]   Patient Name: Daniel Reeves, Daniel Reeves. MRN:  Procedure Procedures: Panendoscopy (EGD) CPT: 43235.  Personnel: Endoscopist: Ulyess Mort, MD.  Exam Location: Exam performed in Outpatient Clinic. Outpatient  Patient Consent: Procedure, Alternatives, Risks and Benefits discussed, consent obtained, from patient. Consent was obtained by the RN.  Indications Symptoms: Reflux symptoms  Surveillance of: prior esophgeal ulcers.  History  Current Medications: Patient is not currently taking Coumadin.  Pre-Exam Physical: Entire physical exam was normal.  Comments: Pt. history reviewed/updated, physical exam performed prior to initiation of sedation? Exam Exam Info: Maximum depth of insertion Duodenum, intended Duodenum. Patient position: on left side. Vocal cords visualized. Gastric retroflexion performed. Images taken. ASA Classification: II.  Sedation Meds: Patient assessed and found to be appropriate for moderate (conscious) sedation. Fentanyl given IV. Versed given IV. Cetacaine Spray given aerosolized.  Monitoring: BP and pulse monitoring done. Oximetry used. Supplemental O2 given  Findings - HIATAL HERNIA: 1 cms. in length. ICD9: Hernia, Hiatal: 553.3. - Normal: Fundus to Jejunum.   Assessment Abnormal examination, see findings above.  Diagnoses: 553.3: Hernia, Hiatal.   Events  Unplanned Intervention: No unplanned interventions were required.  Unplanned Events: There were no complications. Plans Medication(s): Continue current medications.  Patient Education: Patient given standard instructions for: Hiatal Hernia.  Disposition: After procedure patient sent to recovery. After recovery patient sent home.

## 2010-12-28 NOTE — Assessment & Plan Note (Signed)
Summary: 6 months/ mbw   Primary Care Provider:  Alroy Dust, MD  CC:  6 month ROV & review of mult medical problems....  History of Present Illness: 77 y/o WM here for a follow up visit... he has multiple medical problems as noted below...    ~  Aug09:  he has been doing reasonably well without new complaints or concerns except for some dysphagia- he notes difficulty swallowing large pills eg- Glucosamine... I offered him a GI referral for EGD but he declines at present... he will try other strategies including cutting up his larger pills, etc...  ~  Oct09:  saw [name] w/ f/u PSA in the 8-10 range & improved by elim white foods from his diet.  ~  Dec09:  saw [name] for f/u LBBB, CAD- s/p CABG, AAA, Chol... doing well, same meds...   ~  Feb10:  he has several complaints today- 1) nocturnal leg cramps: he's tried several OTC meds w/o relief... we discussed trial of Tonic water in increasing amts, &/or Mustard...  2) IBS acting up & he has fecal incontinence- he is quite sure that "my striangulated intestines shift & block my rectum"... I offered him a GI referral for further eval, vs trial of Miralax + Senakot-S (he prefers the latter).  ~  Aug10:  he's had a good 75mo "couldn't be better" he says... he fell several weeks ago in his garden & injured left ribs- CX[facility] at [facility] was neg- post CABG, Ao calcif, clear lungs... he last saw [name] 12/09- doing well, no changes made... he notes sl dizzy from the Benicar, therefore he decr to 1/2 tab & monitors his BP at home... he is followed by [name] every 75mo w/ rising PSA on watchful waiting protocol... no new complaints or concerns...   ~  February 03, 2010:  he states that "I'm falling apart"- c/o DOE w/ min activity yet doing yard work & just ret from [employer]... his BP is sl lower than usual on Benicar20/d & we discussed decr to 1/2 tab...  he saw [name] 12/10- doing well, no changes made... he saw [name] 8/10 & 1/11- started on AVODART for  incr PSA & it dropped from 11.7 to 4.3 on the med but c/o fatigue, weak, breast tenderness etc... they decided to decr to Qod, now Q3rd day, & feeling sl better... we discussed checking labs today for completeness...  also c/o ?glass shard in finger but I couldn't detect foreing body- he will hot soak it & try to work it out vs call DrSypher for check... finally he is c/o mucous in his stool & fecal incontinence (see above)- prev offered GI referral & he is ready for this now...   ~  August 12, 2010:  he saw [name] for GI 4/11- ch in bowel habits, constip & some fecal incontinence; rec incr fiber + colonoscopy- done 4/11w/ mod divertics, otherw neg...  he saw [name] for Cards f/u 6/1- stable, doing well, no changes made...  he saw [name] for GU f/u 7/11- BPH/ BOO/ elev PSA- improved w/ Avodart Q3d, PSA decr to 4, voiding better, side effects improved w/ decr dose...  today several somatic complaints> left elbow pain, tired all the time, occas skips that he sees in his tortuous brachial art but does not sense (rec to decr caffeine)... BP controlled, no angina, remains active, he is not fasting today & wants to wait 75mo before rechecking labs, refuses flu shot.   Current Problem List:  HYPERTENSION (  ICD-401.9) - on BENICAR 20mg - 1/2 tab daily at present... BP today= 116/68, he monitors BP at home... denies HA, visual changes, CP, palipit, dizziness, syncope, edema, etc...   CAD (ICD-414.00) - he takes ASA 81mg /d... followed by [name] w/ hx of 2 prev CABG's in 1979 & 1993...   ~  NuclearStressTest 3/07 showed infer infarct & mild peri-infarct ischemia, abn wall motion & EF=45%... essentially no change from 2003  ~  check up by [name] 6/11- [facility], doing satis, no changes made, f/u Abd Ao doppler in Dec.  PERIPHERAL VASCULAR DISEASE (ICD-443.9) - he takes the Aspirin 81mg /d... known  ~3cm AAA being followed serially...  ~  ABI's 3/06 were normal   ~  CT Abd 12/08 w/ 3.3cm focal  saccular AAA in distal abd ao. ((it was 3cm in 05)...  ~  AbdAo Doppler 11/08 w/ stable infra-renal fusiform aneurysm  ~ 3cm...  ~  AbdAo Doppler 12/10 showed stable  ~3cm AAA, mod irreg plaque, norm iliacs... f/u 66yr.  HYPERCHOLESTEROLEMIA (ICD-272.0) - on SIMVASTATIN 40mg /d...  ~  FLP 7/08 showed TChol 99, TG 103, HDL 32, LDL 46  ~  FLP 8/09 showed TChol 101, TG 73, HDL 28, LDL 58  ~  FLP 8/10 showed TChol 106, TG 50, HDL 36, LDL 60  GERD (ICD-530.81) - uses OTC meds as needed... last EGD 10/07 by DrSam showed sm HH otherw neg...   ~ 8/09: noted some dysphagia for large pills, offered GI referral but he declined.  DIVERTICULOSIS OF COLON (ICD-562.10) & COLONIC POLYPS (ICD-211.3)  ~  colonoscopy 10/07 by DrSam showed divertics, and 7mm polyp= polypoid mucosa on path... f/u 85yrs planned...  ~  2/10: noted some IBS symptoms and fecal incontinence, again offered GI referral but he declined.  ~  3/11: now he is ready to discuss fecal incontinence w/ gastroenterology> saw [name] 4/11 & rec for icr fiber.  ~  Colonoscopy 4/11 by [name] w/ mod divertics, otherw neg- rec Citrucel daily.  FLANK PAIN, RIGHT (ICD-789.09) - he was seen 12/08 c/o Rt flank pain of 6 months duration... he had seen [name] and was unhappy w/ his eval, concerned about kidney stone vs recurrence of amebic dysentery... 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 BoneScan= arthritic uptake all over but none in rt flank area, ribs, etc...  Rx w/ heating pad and ETODOLAC... he reports that his discomfort pretty much resolved and he felt good about the thorough evaluation...  ~  8/10:  prev right flank pain resolved- no recurrence.  Hx of AMEBIC DYSENTERY (ICD-006.9)  BENIGN PROSTATIC HYPERTROPHY, HX OF (ICD-V13.8) - hx of increased PSA in the 6-8 range and followed by [name]... he  has LTOS including the fact that it takes him 12 min to urinate and empty satisfactorily... he has nocturia x 2-3... he tells me that [name] didn't want to place him on meds, rather he rec a diet for his prostate health w/ fruits + vegetables, low fat, & no white foods like rice etc... his voiding symptoms are improved on this diet, he says... pt has refused cysto & bx of prostate, they are following his PSA's Q26mo.  ~  2010: [name] started Avodart when PSA hit 11.7 & it improved to 4.3, but pt c/o fatigue, weak, breast tenderness- so they are decreasing the Avodart to Qod, now Q3rd day (w/  sl improvement in symptoms).  ~  7/11:  f/u [name] w/ PSA= 4.09 & symptoms improved on the Avodart Q3d...  DEGENERATIVE JOINT DISEASE (ICD-715.90) - he takes OSTEOBIFLEX Bid, + MVI, etc... he manages his mod severe arthritis pain very well and only uses an occas Advil... prev had Etodolac, Tramadol, DCN100- but stopped all of these...  GOUT (ICD-274.9) - on ALLOPURINOL 300mg /d...   ~  labs 2/09 showed Uric = 3.8  ~  labs 8/10 showed Uric = 3.8  BACK PAIN, LUMBAR (ICD-724.2) - s/p decompressive laminectomy 3/07 by [name]...  ANXIETY (ICD-300.00)   Preventive Screening-Counseling & Management  Alcohol-Tobacco     Smoking Status: never  Allergies: 1)  ! Methocarbamol (Methocarbamol) 2)  ! Lyrica (Pregabalin) 3)  ! Lisinopril 4)  ! Ramipril  Comments:  Nurse/Medical Assistant: The patient's medications and allergies were reviewed with the patient and were updated in the Medication and Allergy Lists.  Past History:  Past Medical History: Hx of CERUMEN IMPACTION, BILATERAL (ICD-380.4) HYPERTENSION (ICD-401.9) CAD (ICD-414.00) PERIPHERAL VASCULAR DISEASE (ICD-443.9) ABDOMINAL AORTIC ANEURYSM (ICD-441.4) HYPERCHOLESTEROLEMIA (ICD-272.0) GERD (ICD-530.81) DIVERTICULOSIS OF COLON (ICD-562.10) Hx of FLANK PAIN, RIGHT (ICD-789.09) Hx of AMEBIC DYSENTERY (ICD-006.9) BENIGN PROSTATIC  HYPERTROPHY, HX OF (ICD-V13.8) DEGENERATIVE JOINT DISEASE (ICD-715.90) GOUT (ICD-274.9) BACK PAIN, LUMBAR (ICD-724.2) ANXIETY (ICD-300.00)  Past Surgical History: S/P CABG x 4 in 1979 S/P redo CABG x5 1993 by drWilson S/P right inguinal hernia repair in 1994 by DrGerkin S/P left inguinal hernia repair in 1996 by DrGerkin S/P decompressive laminectomy L2-sacrum 3/07 by [name]  Family History: Reviewed history from 02/26/2010 and no changes required. Mother deceased age Suzanne Boron Father deceased age unknown from old age 59 brother deceased age 68 from parkinsons disease 1 sister deceased age 75 from a fall from a building   Social History: Reviewed history from 02/26/2010 and no changes required. 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  The patient denies anorexia, fever, weight loss, weight gain, vision loss, decreased hearing, hoarseness, chest pain, syncope, peripheral edema, prolonged cough, headaches, hemoptysis, abdominal pain, melena, hematochezia, severe indigestion/heartburn, hematuria, incontinence, muscle weakness, suspicious skin lesions, transient blindness, difficulty walking, depression, unusual weight change, abnormal bleeding, enlarged lymph nodes, and angioedema.    Vital Signs:  Patient profile:   77 year old male Height:      72 inches Weight:      180.13 pounds BMI:     24.52 O2 Sat:      97 % on Room air Temp:     98.2 degrees F oral Pulse rate:   60 / minute BP sitting:   116 / 68  (left arm) Cuff size:   regular  Vitals Entered By: Randell Loop CMA (August 12, 2010 9:25 AM)  O2 Sat at Rest %:  97 O2 Flow:  Room air CC: 6 month ROV & review of mult medical problems... Is Patient Diabetic? No Pain Assessment Patient in pain? yes      Onset of pain  left elbow pain Comments  no changes in meds today   Physical Exam  Additional Exam:  WD, WN, 77 y/o WM in NAD... GENERAL:  Alert & oriented; pleasant & cooperative... HEENT:  [place]/AT, EOM-wnl, PERRLA, EACs-clear, TMs-wnl, NOSE-clear, THROAT-clear & wnl. NECK:  Supple w/ fairROM; no JVD; normal carotid  impulses w/o bruits; no thyromegaly or nodules palpated; no lymphadenopathy. CHEST:  Clear to P & A; without wheezes/ rales/ or rhonchi. HEART:  Regular Rhythm; gr 1/6 SEM, w/o rubs or gallops heard... ABDOMEN:  Soft & nontender; normal bowel sounds; no organomegaly or masses detected. EXT: without deformities, mod arthritic changes (esp hands); no varicose veins/ venous insuffic/ or edema. NEURO:  CN's intact; no focal neuro deficits... DERM:  No lesions noted; no rash etc... can't ident glass splinter in finger- he will hot soak it & observe.    MISC. Report  Procedure date:  08/13/2010  Findings:      DATA REVIEWED:  ~  GI eval [name] 02/26/10 & colonoscopy 03/08/10...  ~  Cards f/u [name] 04/29/10...  ~  GU follow up [name] 06/22/10...   Impression & Recommendations:  Problem # 1:  HYPERTENSION (ICD-401.9) Controlled>  same med. His updated medication list for this problem includes:    Benicar 20 Mg Tabs (Olmesartan medoxomil) .[name]... Take 1/2 tablet once a day  Problem # 2:  CAD (ICD-414.00) Followed by [name] & stable>  same meds. His updated medication list for this problem includes:    Adult Aspirin Ec Low Strength 81 Mg Tbec (Aspirin) .[name]... Take 1 tablet by mouth once a day    Benicar 20 Mg Tabs (Olmesartan medoxomil) .[name]... Take 1/2 tablet once a day    Nitrostat 0.4 Mg Subl (Nitroglycerin) .[name]... As directed...  Problem # 3:  PERIPHERAL VASCULAR DISEASE (ICD-443.9) Follow up ArtDoppler due 12/11...  Problem # 4:  HYPERCHOLESTEROLEMIA (ICD-272.0) His labs have been fine> not fasting today & we will wait til ret OV for FLP... His updated medication list for this problem includes:     Simvastatin 40 Mg Tabs (Simvastatin) .[name]... Take 1 tablet by mouth once a day  Problem # 5:  DIVERTICULOSIS OF COLON (ICD-562.10) Followed by [name] and symptoms improved w/ fiber/ Citrucel... s/p colonoscopy 4/11 as noted...  Problem # 6:  BENIGN PROSTATIC HYPERTROPHY, HX OF (ICD-V13.8) Followed by [name] & stable... continue same on Avodart Q3d...  Problem # 7:  DEGENERATIVE JOINT DISEASE (ICD-715.90) He has DJD & Gout>  stable on meds. His updated medication list for this problem includes:    Adult Aspirin Ec Low Strength 81 Mg Tbec (Aspirin) .[name]... Take 1 tablet by mouth once a day  Problem # 8:  OTHER MEDICAL PROBLEMS AS NOTED>>> He refuses flu shot.  Complete Medication List: 1)  Adult Aspirin Ec Low Strength 81 Mg Tbec (Aspirin) .... Take 1 tablet by mouth once a day 2)  Benicar 20 Mg Tabs (Olmesartan medoxomil) .... Take 1/2 tablet once a day 3)  Simvastatin 40 Mg Tabs (Simvastatin) .... Take 1 tablet by mouth once a day 4)  Citrucel Powd (Methylcellulose (laxative)) .... Take as directed 5)  Avodart 0.5 Mg Caps (Dutasteride) .... Take as directed by drwrenn... every third day 6)  Allopurinol 300 Mg Tabs (Allopurinol) .... Take 1 tablet by mouth once a day 7)  Osteo Bi-flex Adv Joint Shield Tabs (Misc natural products) .... Take one tablet by mouth two times a day 8)  Centrum Silver Tabs (Multiple vitamins-minerals) .... Take 1 tablet by mouth once a day 9)  Folic Acid 400 Mcg Tabs (Folic acid) .... Take 1 tablet by mouth two times a day 10)  Nitrostat 0.4 Mg Subl (Nitroglycerin) .... As directed...  Patient Instructions: 1)  Today we updated your med list- see below.... 2)  Continue your current medications the same.Marland KitchenMarland Kitchen 3)  You guys should get the 2011 Flu vaccine! 4)  Call for any problems.Marland KitchenMarland Kitchen 5)  Please schedule a follow-up appointment in 6 months, and we will plan FASTING blood work at that time.Marland KitchenMarland Kitchen

## 2010-12-28 NOTE — Procedures (Signed)
Summary: Colon   Colonoscopy  Procedure date:  09/07/2006  Findings:      Location:  [facility].   Patient Name: Daniel Reeves, Daniel Reeves MRN:  Procedure Procedures: Colonoscopy CPT: [phone].  Personnel: Endoscopist: Ulyess Mort, MD.  Exam Location: Exam performed in Outpatient Clinic. Outpatient  Patient Consent: Procedure, Alternatives, Risks and Benefits discussed, consent obtained, from patient. Consent was obtained by the RN.  Indications  Average Risk Screening Routine.  History  Current Medications: Patient is not currently taking Coumadin.  Pre-Exam Physical: Entire physical exam was normal.  Comments: Pt. history reviewed/updated, physical exam performed prior to initiation of sedation? Exam Exam: Extent of exam reached: Cecum, extent intended: Cecum.  The cecum was identified by appendiceal orifice and IC valve. Colon retroflexion performed. Images taken. ASA Classification: II. Tolerance: good.  Monitoring: Pulse and BP monitoring, Oximetry used. Supplemental O2 given.  Colon Prep Prep results: good.  Sedation Meds: Patient assessed and found to be appropriate for moderate (conscious) sedation. Fentanyl 50 mcg. given IV. Versed  Findings - DIVERTICULOSIS: Transverse Colon to Sigmoid Colon. ICD9: Diverticulosis, Colon: 562.10. Comments: moderate.  POLYP: Sigmoid Colon, Maximum size: 7 mm. sessile polyp. Procedure:  hot biopsy, removed, retrieved, Polyp sent to pathology. ICD9: Colon Polyps: 211.3.   Assessment Abnormal examination, see findings above.  Diagnoses: 211.3: Colon Polyps.  562.10: Diverticulosis, Colon.   Events  Unplanned Interventions: No intervention was required.  Unplanned Events: There were no complications. Plans Medication Plan: Await pathology. Continue current medications.  Patient Education: Patient given standard instructions for: Polyps. Diverticulosis. prn.  Disposition: After procedure patient  sent to recovery. After recovery patient sent home.

## 2010-12-29 ENCOUNTER — Encounter: Payer: Self-pay | Admitting: Cardiology

## 2010-12-30 NOTE — Progress Notes (Signed)
Summary: concerns  Phone Note Call from Patient Call back at Home Phone [phone]   Caller: Patient Call For: Dr. Christella Hartigan Reason for Call: Talk to Nurse Summary of Call: reporting blood on toilet paper, rectal pain, mucus d/c, fecal incontinent... would like to discuss with a nurse Initial call taken by: Vallarie Mare,  November 09, 2010 11:40 AM  Follow-up for Phone Call        left message on machine to call back Chales Abrahams CMA Duncan Dull)  November 09, 2010 12:41 PM   left message on machine to call back message left for pt to call if he still has questions or concerns Follow-up by: Chales Abrahams CMA Duncan Dull),  November 10, 2010 2:10 PM

## 2010-12-30 NOTE — Miscellaneous (Signed)
Summary: Orders Update  Clinical Lists Changes  Orders: Added new Test order of Abdominal Aorta Duplex (Abd Aorta Duplex) - Signed 

## 2010-12-30 NOTE — Assessment & Plan Note (Signed)
Review of gastrointestinal problems: 1. Moderate to severe left-sided diverticulosis with peridiverticular edema noted on colonoscopy April 2011. No colon polyps or cancers. 2. routine risk for colon cancer, colonoscopy April 2011    History of Present Illness Visit Type: Follow-up Visit Primary GI MD: Rob Bunting MD Primary Provider: Alroy Dust, MD Chief Complaint: continued bowel changes History of Present Illness:     very pleasant 77 year old man whom I last saw this past April. Since then he went on a cruise this past December, couldnt take his citrucel.  He had terrible loose stools, incontinence on the ship.  Lately he is having blood on TP, a lot of mucous with BM.  Also had rectal pain on occasion.  He never has a full BM, will have four 1/4 BMs about 4 times a day.   Prior to the cruise, he was still have some urgency (especially in AM) but was much better than he is now. . He got back on citrucel since the cruise.  it sounds like he is over cleaning his anus after all movements.            Current Medications (verified): 1)  Adult Aspirin Ec Low Strength 81 Mg  Tbec (Aspirin) .... Take 1 Tablet By Mouth Once A Day 2)  Benicar 20 Mg Tabs (Olmesartan Medoxomil) .... Take 1/2 Tablet Once A Day 3)  Simvastatin 40 Mg  Tabs (Simvastatin) .... Take 1 Tablet By Mouth Once A Day 4)  Citrucel  Powd (Methylcellulose (Laxative)) .... Take As Directed 5)  Avodart 0.5 Mg Caps (Dutasteride) .... Take As Directed By [name]... Every Third Day 6)  Allopurinol 300 Mg  Tabs (Allopurinol) .... Take 1 Tablet By Mouth Once A Day 7)  Osteo Bi-Flex Adv Joint Shield   Tabs (Misc Natural Products) .... Take One Tablet By Mouth Two Times A Day 8)  Centrum Silver   Tabs (Multiple Vitamins-Minerals) .... Take 1 Tablet By Mouth Once A Day 9)  Folic Acid 400 Mcg Tabs (Folic Acid) .... Take 1 Tablet By Mouth Two Times A Day 10)  Nitrostat 0.4 Mg Subl (Nitroglycerin) .... As  Directed...  Allergies (verified): 1)  ! Methocarbamol (Methocarbamol) 2)  ! Lyrica (Pregabalin) 3)  ! Lisinopril 4)  ! Ramipril  Vital Signs:  Patient profile:   77 year old male Height:      72 inches Weight:      181 pounds BMI:     24.64 Pulse rate:   68 / minute Pulse rhythm:   regular BP sitting:   140 / 60  (left arm)  Vitals Entered By: Chales Abrahams CMA (AAMA) (December 07, 2010 10:12 AM)  Physical Exam  Additional Exam:  Constitutional: generally well appearing Psychiatric: alert and oriented times 3 Abdomen: soft, non-tender, non-distended, normal bowel sounds    Impression & Recommendations:  Problem # 1:  Intermittent incontinence, urgency, intermittent rectal bleeding he had a colonoscopy less than a year ago and that does not need to be repeated now. He had moderate to severe diverticulosis and. Diverticular edema. I wonder if this is causing some of his symptoms. I will start him on mesalamine, anti-inflammatory one pill twice daily and he'll return to see me in 4 weeks and sooner if needed. He will continue on Citrucel daily. I want him to try not to over cleaning his anus, see those recommendations below.  Patient Instructions: 1)  Continue on citrucel daily. 2)  Start lialda, 2 pills once a day. 3)  Call Dr. Christella Hartigan office in 4 weeks, if no improvement in symptoms then we will possibly increase the pill to 4 a day. 4)  A copy of this information will be sent to Dr. Kriste Basque. 5)  Avoid overcleaning of the anus, use baby-wipe after gentle toilet paper cleaning. 6)  The medication list was reviewed and reconciled.  All changed / newly prescribed medications were explained.  A complete medication list was provided to the patient / caregiver. Prescriptions: LIALDA 1.2 GM  TBEC (MESALAMINE) take 2 pills in AM, every day  #60 x 4   Entered and Authorized by:   Rachael Fee MD   Signed by:   Rachael Fee MD on 12/07/2010   Method used:   Electronically to         [facility] (retail)       [address]       [place], [place]  [address]       Ph: [phone]       Fax: [phone]   RxID:   [id number]   Appended Document: lialda pt request lialda to be sent to [facility]   Clinical Lists Changes  Medications: Rx of LIALDA 1.2 GM  TBEC (MESALAMINE) take 2 pills in AM, every day;  #180 x 3;  Signed;  Entered by: Chales Abrahams CMA (AAMA);  Authorized by: Rachael Fee MD;  Method used: Faxed to [facility] [place], , , [place]  , Ph: [phone], Fax: [phone]    Prescriptions: LIALDA 1.2 GM  TBEC (MESALAMINE) take 2 pills in AM, every day  #180 x 3   Entered by:   Chales Abrahams CMA (AAMA)   Authorized by:   Rachael Fee MD   Signed by:   Chales Abrahams CMA (AAMA) on 12/08/2010   Method used:   Faxed to ...       [facility] [place] (mail-order)             , [place]         Ph: [phone]       Fax: [phone]   RxID:   [id number]

## 2010-12-30 NOTE — Progress Notes (Signed)
Summary: request for appt  Phone Note Other Incoming Call back at [facility] Phone [phone]   Caller: pt Summary of Call: pt continues to complain of alternating diarrhea and regular stool with in hours of each other everyday.  Has been on citrucel and has not noticed any difference.  Would like appt first of Jan,  scheduled for 12/07/10. Initial call taken by: Chales Abrahams CMA Duncan Dull),  November 10, 2010 3:28 PM

## 2010-12-31 NOTE — Letter (Signed)
[facility]: [facility]   Imported By: Sherian Rein 07/05/2010 14:35:24  _____________________________________________________________________  External Attachment:    Type:   Image     Comment:   External Document

## 2011-01-04 ENCOUNTER — Telehealth: Payer: Self-pay | Admitting: Gastroenterology

## 2011-01-05 ENCOUNTER — Ambulatory Visit: Payer: Self-pay | Admitting: Gastroenterology

## 2011-01-05 NOTE — Letter (Signed)
Summary: Results Follow-up  [facility], Main Office  [address]   [place], [place] [address]   Phone: [phone]  Fax: [phone]     December 29, 2010 MRN: [id number]   Daniel Reeves [address] [place], [place]  [address]   Dear Mr. Alred,  We have received the results from your abdominal aorta duplex and have been unable to contact you.  Please call our office at the number listed above so that Dr.  Rosalyn Charters nurse may review the results with you.    Thank you,  [facility]

## 2011-01-13 NOTE — Letter (Signed)
Summary: [facility]   Imported By: Lennie Odor 01/03/2011 10:14:15  _____________________________________________________________________  External Attachment:    Type:   Image     Comment:   External Document

## 2011-01-13 NOTE — Progress Notes (Signed)
Summary: update  Phone Note Call from Patient Call back at Home Phone [phone]   Caller: Patient Call For: dR. jACOBS Reason for Call: Talk to Nurse Summary of Call: Pt said he was told to call and update on condition. Initial call taken by: [place] Johnson,  January 04, 2011 10:16 AM  Follow-up for Phone Call        pt is much better, he has less urgency.  he will call if symptoms change. Follow-up by: Chales Abrahams CMA Duncan Dull),  January 04, 2011 10:29 AM  Additional Follow-up for Phone Call Additional follow up Details #1::        great Additional Follow-up by: Rachael Fee MD,  January 04, 2011 10:43 AM

## 2011-01-14 ENCOUNTER — Telehealth (INDEPENDENT_AMBULATORY_CARE_PROVIDER_SITE_OTHER): Payer: Self-pay | Admitting: *Deleted

## 2011-01-19 NOTE — Progress Notes (Signed)
Summary: benicar has been approved thru 12/2011  Phone Note From Other Clinic   Caller: Kennyth Arnold with [facility] Medicare Call For: nadel Summary of Call: Stacy with [facility] called they received a request for Benicar and she was calling to advise that this has been approved as of Feb 16th 2012 and it is good through Feb 2013. They will send a letter out with this information she can  be reached at [phone] Initial call taken by: Vedia Coffer,  January 14, 2011 3:59 PM  Follow-up for Phone Call        line busy at home number---will try back  Randell Loop [facility]  January 14, 2011 4:08 PM   pt aware med approved Follow-up by: Philipp Deputy CMA,  January 14, 2011 5:47 PM

## 2011-02-08 ENCOUNTER — Other Ambulatory Visit: Payer: Self-pay | Admitting: Pulmonary Disease

## 2011-02-08 ENCOUNTER — Encounter: Payer: Self-pay | Admitting: Pulmonary Disease

## 2011-02-08 ENCOUNTER — Other Ambulatory Visit: Payer: Medicare Other

## 2011-02-08 ENCOUNTER — Ambulatory Visit (INDEPENDENT_AMBULATORY_CARE_PROVIDER_SITE_OTHER): Payer: Medicare Other | Admitting: Pulmonary Disease

## 2011-02-08 DIAGNOSIS — E7800 Pure hypercholesterolemia, unspecified: Secondary | ICD-10-CM

## 2011-02-08 DIAGNOSIS — I714 Abdominal aortic aneurysm, without rupture: Secondary | ICD-10-CM

## 2011-02-08 DIAGNOSIS — K219 Gastro-esophageal reflux disease without esophagitis: Secondary | ICD-10-CM

## 2011-02-08 DIAGNOSIS — I10 Essential (primary) hypertension: Secondary | ICD-10-CM

## 2011-02-08 DIAGNOSIS — K5730 Diverticulosis of large intestine without perforation or abscess without bleeding: Secondary | ICD-10-CM

## 2011-02-08 DIAGNOSIS — D126 Benign neoplasm of colon, unspecified: Secondary | ICD-10-CM

## 2011-02-08 DIAGNOSIS — I2510 Atherosclerotic heart disease of native coronary artery without angina pectoris: Secondary | ICD-10-CM

## 2011-02-08 DIAGNOSIS — M109 Gout, unspecified: Secondary | ICD-10-CM

## 2011-02-08 DIAGNOSIS — I739 Peripheral vascular disease, unspecified: Secondary | ICD-10-CM

## 2011-02-08 LAB — HEPATIC FUNCTION PANEL
ALT: 34 U/L (ref 0–53)
Albumin: 4.1 g/dL (ref 3.5–5.2)
Bilirubin, Direct: 0.2 mg/dL (ref 0.0–0.3)

## 2011-02-08 LAB — CBC WITH DIFFERENTIAL/PLATELET
Basophils Absolute: 0 10*3/uL (ref 0.0–0.1)
Basophils Relative: 0.2 % (ref 0.0–3.0)
Eosinophils Absolute: 0.2 10*3/uL (ref 0.0–0.7)
Eosinophils Relative: 2.4 % (ref 0.0–5.0)
HCT: 41 % (ref 39.0–52.0)
Hemoglobin: 14.3 g/dL (ref 13.0–17.0)
Monocytes Absolute: 0.5 10*3/uL (ref 0.1–1.0)
Neutrophils Relative %: 69 % (ref 43.0–77.0)
RDW: 13.9 % (ref 11.5–14.6)

## 2011-02-08 LAB — LIPID PANEL
Total CHOL/HDL Ratio: 4
VLDL: 20 mg/dL (ref 0.0–40.0)

## 2011-02-08 LAB — BASIC METABOLIC PANEL
BUN: 18 mg/dL (ref 6–23)
Chloride: 107 mEq/L (ref 96–112)
GFR: 78.21 mL/min (ref >60.00)
Sodium: 139 mEq/L (ref 135–145)

## 2011-02-24 NOTE — Assessment & Plan Note (Signed)
Summary: rov   Primary Care Provider:  Alroy Dust, MD  CC:  6 month ROV & review of mult medical problems....  History of Present Illness: Daniel Reeves here for a follow up visit... he has multiple medical problems including HBP;  CAD followed by [name];  ASPVD w/ 3cm AAA that has been stable;  Hyperchol;  GERD;  Divertics/ Polyps/ ?fecal incontinence;  Hx amebic dysentery yrs ago;  BPH/ BOO/ elev PSA on Avodart & followed by [name];  DJD/ Gout/ hx LBP w/ surg 2007 by [name]...   ~  August 12, 2010:  he saw [name] for GI 4/11- ch in bowel habits, constip & some fecal incontinence; rec incr fiber + colonoscopy- done 4/11w/ mod divertics, otherw neg...  he saw [name] for Cards f/u 6/1- stable, doing well, no changes made...  he saw [name] for GU f/u 7/11- BPH/ BOO/ elev PSA- improved w/ Avodart Q3d, PSA decr to 4, voiding better, side effects improved w/ decr dose...  today several somatic complaints> left elbow pain, tired all the time, occas skips that he sees in his tortuous brachial art but does not sense (rec to decr caffeine)... BP controlled, no angina, remains active, he is not fasting today & wants to wait 93mo before rechecking labs, refuses flu shot.   ~  February 08, 2011:  93mo ROV & c/o being tired, always tired, up at night at least 3 times & this may account for some of the fatigue;  we decided to recheck his full lab work & everything is WNL (x PSA=4.29 followed by [name], & Vit D 37- rec OTC 1000u supplement daily)...    He saw [name] 1/12> f/u BPH, BOO, elev PSA on Avodart since 10/10 (w/ PSA decr from 11 to 4);  stable on rx & he will continue Q61mo f/u (also c/o ED- given Levitra trial)...    He saw [name] for GI 1/12>  mod severe left colon divertics, c/o loose stools w/ bl & mucous, given trial Lialda rx + Citrucel daily (less blood seen still some leakage & mucous)...   Current Problem List:  HYPERTENSION (ICD-401.9) - on BENICAR 20mg - 1/2 tab daily at  present... BP today= 116/68, he monitors BP at home... denies HA, visual changes, CP, palipit, dizziness, syncope, edema, etc...   CAD (ICD-414.00) - he takes ASA 81mg /d... followed by [name] w/ hx of 2 prev CABG's in 1979 & 1993...   ~  NuclearStressTest 3/07 showed infer infarct & mild peri-infarct ischemia, abn wall motion & EF=45%... essentially no change from 2003  ~  check up by [name] 6/11- [facility], doing satis, no changes made, f/u Abd Ao doppler in Dec.  PERIPHERAL VASCULAR DISEASE (ICD-443.9) - he takes the Aspirin 81mg /d... known  ~3cm AAA being followed serially...  ~  ABI's 3/06 were normal   ~  CT Abd 12/08 w/ 3.3cm focal saccular AAA in distal abd ao. ((it was 3cm in 05)...  ~  AbdAo Doppler 11/08 w/ stable infra-renal fusiform aneurysm  ~ 3cm...  ~  AbdAo Doppler 12/10 showed stable  ~3cm AAA, mod irreg plaque, norm iliacs... f/u 33yr.  ~  AbdAo Doppler 12/11 showed stable  ~3cm AAA, mod mid-distal Ao stenosis, f/u 53yr.  HYPERCHOLESTEROLEMIA (ICD-272.0) - on SIMVASTATIN 40mg /d...  ~  FLP 7/08 showed TChol 99, TG 103, HDL 32, LDL 46  ~  FLP 8/09 showed TChol 101, TG 73, HDL 28, LDL 58  ~  FLP 8/10 showed TChol 106, TG  50, HDL 36, LDL 60  GERD (ICD-530.81) - uses OTC meds as needed... last EGD 10/07 by DrSam showed sm HH otherw neg...   ~ 8/09: noted some dysphagia for large pills, offered GI referral but he declined.  DIVERTICULOSIS OF COLON (ICD-562.10) & COLONIC POLYPS (ICD-211.3)  ~  colonoscopy 10/07 by DrSam showed divertics, and 7mm polyp= polypoid mucosa on path... f/u 4yrs planned...  ~  2/10: noted some IBS symptoms and fecal incontinence, again offered GI referral but he declined.  ~  3/11: now he is ready to discuss fecal incontinence w/ gastroenterology> saw [name] 4/11 & rec for icr fiber.  ~  Colonoscopy 4/11 by [name] w/ mod divertics, otherw neg- rec Citrucel daily.  FLANK PAIN, RIGHT (ICD-789.09) - he was seen 12/08 c/o Rt flank pain of  6 months duration... he had seen [name] and was unhappy w/ his eval, concerned about kidney stone vs recurrence of amebic dysentery... 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 BoneScan= arthritic uptake all over but none in rt flank area, ribs, etc...  Rx w/ heating pad and ETODOLAC... he reports that his discomfort pretty much resolved and he felt good about the thorough evaluation...  ~  8/10:  prev right flank pain resolved- no recurrence.  Hx of AMEBIC DYSENTERY (ICD-006.9)  BENIGN PROSTATIC HYPERTROPHY, HX OF (ICD-V13.8) - hx of increased PSA in the 6-8 range and followed by [name]... he has LTOS including the fact that it takes him 12 min to urinate and empty satisfactorily... he has nocturia x 2-3... he tells me that [name] didn't want to place him on meds, rather he rec a diet for his prostate health w/ fruits + vegetables, low fat, & no white foods like rice etc... his voiding symptoms are improved on this diet, he says... pt has refused cysto & bx of prostate, they are following his PSA's Q50mo.  ~  2010: [name] started Avodart when PSA hit 11.7 & it improved to 4.3, but pt c/o fatigue, weak, breast tenderness- so they are decreasing the Avodart to Qod, now Q3rd day (w/ sl improvement in symptoms).  ~  7/11:  f/u [name] w/ PSA= 4.09 & symptoms improved on the Avodart Q3d...  DEGENERATIVE JOINT DISEASE (ICD-715.90) - he takes OSTEOBIFLEX Bid, + MVI, etc... he manages his mod severe arthritis pain very well and only uses an occas Advil... prev had Etodolac, Tramadol, DCN100- but stopped all of these...  GOUT (ICD-274.9) - on ALLOPURINOL 300mg /d...   ~  labs 2/09 showed Uric = 3.8  ~  labs 8/10 showed Uric = 3.8  BACK PAIN, LUMBAR (ICD-724.2) - s/p decompressive laminectomy 3/07 by [name]...  ANXIETY (ICD-300.00)   Preventive  Screening-Counseling & Management  Alcohol-Tobacco     Smoking Status: quit     Year Quit: 1946  Allergies: 1)  ! Methocarbamol (Methocarbamol) 2)  ! Lyrica (Pregabalin) 3)  ! Lisinopril 4)  ! 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.4) HYPERTENSION (ICD-401.9) CAD (ICD-414.00) PERIPHERAL VASCULAR DISEASE (ICD-443.9) ABDOMINAL AORTIC ANEURYSM (ICD-441.4) HYPERCHOLESTEROLEMIA (ICD-272.0) GERD (ICD-530.81) DIVERTICULOSIS OF COLON (ICD-562.10) Hx of FLANK PAIN, RIGHT (ICD-789.09) Hx of AMEBIC DYSENTERY (ICD-006.9) BENIGN PROSTATIC HYPERTROPHY, HX OF (ICD-V13.8) DEGENERATIVE JOINT DISEASE (ICD-715.90) GOUT (ICD-274.9) BACK PAIN, LUMBAR (ICD-724.2) ANXIETY (ICD-300.00)  Past Surgical History: S/P CABG x 4 in 1979 S/P redo CABG x5  1993 by drWilson S/P right inguinal hernia repair in 1994 by DrGerkin S/P left inguinal hernia repair in 1996 by DrGerkin S/P decompressive laminectomy L2-sacrum 3/07 by [name]  Family History: Reviewed history from 02/26/2010 and no changes required. Mother deceased age Suzanne Boron Father deceased age unknown from old age 78 brother deceased age 36 from parkinsons disease 1 sister deceased age 46 from a fall from a building   Social History: Reviewed history from 02/26/2010 and no changes required. 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  The patient denies anorexia, fever, weight loss, weight gain, vision loss, decreased hearing, hoarseness, chest pain, syncope, dyspnea on exertion, peripheral edema, prolonged cough, headaches, hemoptysis, abdominal pain, melena, hematochezia, severe indigestion/heartburn, hematuria, suspicious skin lesions, transient blindness, difficulty walking,  depression, unusual weight change, abnormal bleeding, enlarged lymph nodes, and angioedema.    Vital Signs:  Patient profile:   Daniel year old male Height:      72 inches Weight:      182.25 pounds BMI:     24.81 O2 Sat:      98 % on Room air Temp:     97.0 degrees F oral Pulse rate:   58 / minute BP sitting:   116 / 64  (left arm) Cuff size:   regular  Vitals Entered By: Randell Loop CMA (February 08, 2011 9:21 AM)  O2 Sat at Rest %:  98 O2 Flow:  Room air CC: 6 month ROV & review of mult medical problems... Is Patient Diabetic? No Pain Assessment Patient in pain? no      Comments no changes in meds today    Physical Exam  Additional Exam:  WD, WN, 77 y/o Reeves in NAD... GENERAL:  Alert & oriented; pleasant & cooperative... HEENT:  Calvin/AT, EOM-wnl, PERRLA, EACs-clear, TMs-wnl, NOSE-clear, THROAT-clear & wnl. NECK:  Supple w/ fairROM; no JVD; normal carotid impulses w/o bruits; no thyromegaly or nodules palpated; no lymphadenopathy. CHEST:  Clear to P & A; without wheezes/ rales/ or rhonchi. HEART:  Regular Rhythm; gr 1/6 SEM, w/o rubs or gallops heard... ABDOMEN:  Soft & nontender; normal bowel sounds; no organomegaly or masses detected. EXT: without deformities, mod arthritic changes (esp hands); no varicose veins/ venous insuffic/ or edema. NEURO:  CN's intact; no focal neuro deficits... DERM:  No lesions noted; no rash etc... can't ident glass splinter in finger- he will hot soak it & observe.    Impression & Recommendations:  Problem # 1:  HYPERTENSION (ICD-401.9) Controlled on med>  continue same. His updated medication list for this problem includes:    Benicar 20 Mg Tabs (Olmesartan medoxomil) .[name]... Take 1/2 tablet once a day  Orders: T-Vitamin D (25-Hydroxy) ([address]-60454) TLB-BMP (Basic Metabolic Panel-BMET) (80048-METABOL) TLB-Hepatic/Liver Function Pnl (80076-HEPATIC) TLB-CBC Platelet - w/Differential (85025-CBCD) TLB-Lipid Panel (80061-LIPID) TLB-TSH (Thyroid  Stimulating Hormone) (84443-TSH) TLB-Uric Acid, Blood (84550-URIC) TLB-PSA (Prostate Specific Antigen) (84153-PSA)  Problem # 2:  CAD (ICD-414.00) Follwed by [name] & stable>  same Rx. His updated medication list for this problem includes:    Adult Aspirin Ec Low Strength 81 Mg Tbec (Aspirin) .[name]... Take 1 tablet by mouth once a day    Benicar 20 Mg Tabs (Olmesartan medoxomil) .[name]... Take 1/2 tablet once a day    Nitrostat 0.4  Mg Subl (Nitroglycerin) .[name]... As directed...  Problem # 3:  PERIPHERAL VASCULAR DISEASE (ICD-443.9) He had Gretel Acre 12/11 & 3cm AAA is stable no real change...  Problem # 4:  HYPERCHOLESTEROLEMIA (ICD-272.0) On Simva40 & FLP looks great> continue same. His updated medication list for this problem includes:    Simvastatin 40 Mg Tabs (Simvastatin) .[name]... Take 1 tablet by mouth once a day  Problem # 5:  DIVERTICULOSIS OF COLON (ICD-562.10) GI followed by [name] & his notes reviewed>  on Lialda now + Citruel and sl improved he says...\  Problem # 6:  BENIGN PROSTATIC HYPERTROPHY, HX OF (ICD-V13.8) Followed by [name] as noted, on Avodart & PSA down to  ~4 range...  he continues to follow Q83mo...  Problem # 7:  MULT MEDICAL PROBLEMS>>> As noted...  Complete Medication List: 1)  Adult Aspirin Ec Low Strength 81 Mg Tbec (Aspirin) .... Take 1 tablet by mouth once a day 2)  Benicar 20 Mg Tabs (Olmesartan medoxomil) .... Take 1/2 tablet once a day 3)  Simvastatin 40 Mg Tabs (Simvastatin) .... Take 1 tablet by mouth once a day 4)  Lialda 1.2 Gm Tbec (Mesalamine) .... Take 2 pills in am, every day 5)  Citrucel Powd (Methylcellulose (laxative)) .... Take as directed 6)  Avodart 0.5 Mg Caps (Dutasteride) .... Take as directed by drwrenn... every third day 7)  Allopurinol 300 Mg Tabs (Allopurinol) .... Take 1 tablet by mouth once a day 8)  Osteo Bi-flex Adv Joint Shield Tabs (Misc natural products) .... Take one tablet by mouth two times a day 9)  Centrum Silver Tabs  (Multiple vitamins-minerals) .... Take 1 tablet by mouth once a day 10)  Folic Acid 400 Mcg Tabs (Folic acid) .... Take 1 tablet by mouth two times a day 11)  Nitrostat 0.4 Mg Subl (Nitroglycerin) .... As directed...  Patient Instructions: 1)  Today we updated your med list- see below.... 2)  Continue your current meds the same... 3)  Today we did your follow up FASTING blood work... please call the "phone tree" in a few days for your lab results.Marland KitchenMarland Kitchen 4)  Stay as active as possible & be safe.... 5)  Call for any problems.Marland KitchenMarland Kitchen 6)  Please schedule a follow-up appointment in 6 months.   Immunization History:  Influenza Immunization History:    Influenza:  declined/never takes (02/08/2011)  Pneumovax Immunization History:    Pneumovax:  declined/never takes (02/08/2011)

## 2011-03-11 ENCOUNTER — Emergency Department (HOSPITAL_BASED_OUTPATIENT_CLINIC_OR_DEPARTMENT_OTHER)
Admission: EM | Admit: 2011-03-11 | Discharge: 2011-03-11 | Disposition: A | Discharge status: Home / Self Care | Payer: Medicare Other | Attending: Emergency Medicine | Admitting: Emergency Medicine

## 2011-03-11 ENCOUNTER — Telehealth: Payer: Self-pay | Admitting: Pulmonary Disease

## 2011-03-11 ENCOUNTER — Emergency Department (INDEPENDENT_AMBULATORY_CARE_PROVIDER_SITE_OTHER): Payer: Medicare Other

## 2011-03-11 DIAGNOSIS — I714 Abdominal aortic aneurysm, without rupture, unspecified: Secondary | ICD-10-CM | POA: Insufficient documentation

## 2011-03-11 DIAGNOSIS — R509 Fever, unspecified: Secondary | ICD-10-CM

## 2011-03-11 DIAGNOSIS — N201 Calculus of ureter: Secondary | ICD-10-CM

## 2011-03-11 DIAGNOSIS — I10 Essential (primary) hypertension: Secondary | ICD-10-CM | POA: Insufficient documentation

## 2011-03-11 DIAGNOSIS — N200 Calculus of kidney: Secondary | ICD-10-CM | POA: Insufficient documentation

## 2011-03-11 DIAGNOSIS — Z951 Presence of aortocoronary bypass graft: Secondary | ICD-10-CM | POA: Insufficient documentation

## 2011-03-11 DIAGNOSIS — N1330 Unspecified hydronephrosis: Secondary | ICD-10-CM

## 2011-03-11 DIAGNOSIS — R112 Nausea with vomiting, unspecified: Secondary | ICD-10-CM | POA: Insufficient documentation

## 2011-03-11 DIAGNOSIS — E7800 Pure hypercholesterolemia, unspecified: Secondary | ICD-10-CM | POA: Insufficient documentation

## 2011-03-11 DIAGNOSIS — I2510 Atherosclerotic heart disease of native coronary artery without angina pectoris: Secondary | ICD-10-CM | POA: Insufficient documentation

## 2011-03-11 LAB — COMPREHENSIVE METABOLIC PANEL
BUN: 20 mg/dL (ref 6–23)
CO2: 23 mEq/L (ref 19–32)
Chloride: 105 mEq/L (ref 96–112)
Creatinine, Ser: 1.3 mg/dL (ref 0.4–1.5)
GFR calc non Af Amer: 53 mL/min — ABNORMAL LOW (ref >60)
Glucose, Bld: 124 mg/dL — ABNORMAL HIGH (ref 70–99)
Total Bilirubin: 1.3 mg/dL — ABNORMAL HIGH (ref 0.3–1.2)

## 2011-03-11 LAB — CBC
HCT: 40.6 % (ref 39.0–52.0)
MCV: 92.3 fL (ref 78.0–100.0)
RBC: 4.4 MIL/uL (ref 4.22–5.81)
WBC: 10.8 10*3/uL — ABNORMAL HIGH (ref 4.0–10.5)

## 2011-03-11 LAB — URINALYSIS, ROUTINE W REFLEX MICROSCOPIC
Glucose, UA: NEGATIVE mg/dL
Leukocytes, UA: NEGATIVE
Protein, ur: 30 mg/dL — AB
Specific Gravity, Urine: 1.02 (ref 1.005–1.030)
pH: 5 (ref 5.0–8.0)

## 2011-03-11 LAB — DIFFERENTIAL
Basophils Absolute: 0 10*3/uL (ref 0.0–0.1)
Eosinophils Absolute: 0.1 10*3/uL (ref 0.0–0.7)
Monocytes Relative: 6 % (ref 3–12)
Neutrophils Relative %: 78 % — ABNORMAL HIGH (ref 43–77)

## 2011-03-11 LAB — URINE MICROSCOPIC-ADD ON

## 2011-03-11 MED ORDER — IOHEXOL 300 MG/ML  SOLN
100.0000 mL | Freq: Once | INTRAMUSCULAR | Status: AC | PRN
  Administered 2011-03-11: 100 mL via INTRAVENOUS

## 2011-03-11 NOTE — Telephone Encounter (Signed)
Spoke w/ pt wife and she states pt is on his way to [place] [employer]. Pt was having severe groin pain and vomiting. Will sign off message since nothing further was needed

## 2011-03-11 NOTE — Telephone Encounter (Signed)
Pt calling again states her husband is in terrrible pain and she needs to know how to proceed pls advise.Daniel Reeves

## 2011-03-12 LAB — URINE CULTURE: Culture  Setup Time: 201204132138

## 2011-03-24 ENCOUNTER — Telehealth: Payer: Self-pay | Admitting: Pulmonary Disease

## 2011-03-24 NOTE — Telephone Encounter (Signed)
Per SN--he will print out everything from the ER visit at the [facility] med center and will call him to discuss. thanks

## 2011-03-24 NOTE — Telephone Encounter (Signed)
Pt would like for SN to review labs and ER report and let him know if there is anything wrong with his liver or his lungs. Pt seen at the Med [facility] in [place] on 03/11/2011. Pls advise.

## 2011-03-24 NOTE — Telephone Encounter (Signed)
Pt aware SN will give him a call once he reviews everything from the ER visit.

## 2011-04-07 ENCOUNTER — Telehealth: Payer: Self-pay | Admitting: Pulmonary Disease

## 2011-04-07 DIAGNOSIS — L600 Ingrowing nail: Secondary | ICD-10-CM

## 2011-04-07 MED ORDER — LOSARTAN POTASSIUM 50 MG PO TABS: 50.0000 mg | ORAL_TABLET | Freq: Every day | ORAL | Status: DC

## 2011-04-07 NOTE — Telephone Encounter (Signed)
Pt is requesting referral to a podiatrist for ingrown toenail x 1 week. He also needs a substitute for Benicar due to insurance coverage. His formulary says they cover Losartan (tier 1), Diovan (tier 3) and Micardis (tier 3) and Lisinopril (tier 1).  Pt has tried Lisinopril in the past and experienced dizziness. He would like to try Losartan since it is also a tier 1 if SN thinks this is an appropriate alternative. Pls advise. Okay to leave msg on pt's machine if he does not answer.

## 2011-04-07 NOTE — Telephone Encounter (Signed)
lmomtcb x1 

## 2011-04-07 NOTE — Telephone Encounter (Signed)
Spoke with pt and notified of recs per SN. Pt verbalized understanding and denies any questions. Rx for losartan sent to [facility].

## 2011-04-07 NOTE — Telephone Encounter (Signed)
Per SN--losartan is ok, call in losartan 50mg    #30  1 daily with 11 refills.   Ok to refer to [facility].  thanks

## 2011-04-12 NOTE — Assessment & Plan Note (Signed)
[facility]                            CARDIOLOGY OFFICE NOTE   NAME:Starke, VONG GARRINGER                      MRN:          [id number]  DATE:09/17/2007                            DOB:          [birth year]    Daniel Reeves is in for followup.  He is actually stable.  He does have a  little bit of dizziness and some tiredness.  He has not been having any  ongoing chest pain.  He says overall he feels great.   MEDICATIONS:  1. Folic acid daily.  2. Allopurinol 300 mg daily.  3. Aspirin 81 mg b.i.d.  4. Centrum Silver daily.  5. OsteoBiFlex b.i.d.  6. Simvastatin 40 mg daily.  7. Benicar 10 mg daily.   PHYSICAL EXAMINATION:  VITAL SIGNS:  The blood pressure is 132/76, pulse  is 50.  GENERAL:  He is alert and oriented in no distress.  LUNGS:  Fields are clear.  HEART:  There is not a definite murmur.  EXTREMITIES:  Reveal no edema.   Electrocardiogram demonstrates a sinus bradycardia, otherwise  unremarkable.   IMPRESSION:  1. Coronary artery disease, status post coronary artery bypass graft      surgery.  2. Hypercholesterolemia on lipid-lowering therapy with LDL of 46.  3. Hypertension on Benicar.  4. Advanced age.  5. Gout.   PLAN:  1. Return to clinic in 6 months.  2. He will need followup of his abdominal aortic aneurysm.  3. He will return to see me sooner if problems arise.   ADDENDUM:  We will get a CBC sooner if problems arise.     Arturo Morton. Riley Kill, MD, [facility]  Electronically Signed   TDS/MedQ  DD: 09/17/2007  DT: 09/17/2007  Job #: [id number]   cc:   Lonzo Cloud. Kriste Basque, MD

## 2011-04-12 NOTE — Assessment & Plan Note (Signed)
[facility]                            CARDIOLOGY OFFICE NOTE   NAME:Daniel Reeves, Daniel Reeves                      MRN:          [id number]  DATE:03/26/2008                            DOB:          Nov 15, 1924    Daniel Reeves is in for followup.  He has been lifting a lot of things  working in his yard.  He says that he sometimes feels a little quivering  in his left chest, almost like a snake underneath the skin; however, it  does not feel like definite pressure or heaviness.  He denies any  ongoing exertional-type chest pain.  He has an underlying left bundle  branch block, so it is difficult to recommend exercise testing; and his  baseline radionuclide imaging study, last 2007, is abnormal due to known  unperfused inferior wall.  He has had a prior mammary.  He generally,  however, feels well and is able to maintain a relatively good activity  level.   MEDICATIONS INCLUDE:  1. Folic acid 400 mg b.i.d.  2. Allopurinol 300 mg daily.  3. Aspirin 81 mg daily.  4. Centrum Silver daily.  5. Osteo Bi-Flex b.i.d.  6. Simvastatin 40 mg daily.  7. Benicar 10 mg daily.   PHYSICAL EXAMINATION:  He is alert and oriented in no distress.  Blood pressure 120/60, pulse is 54.  Lung fields are clear.  The cardiac rhythm is regular.  There is a nondisplaced PMI.  There is  paradoxical splitting of second heart sound.  There is no extremity  edema.  There is no increased widened pulsations.   EKG:  Reveals sinus bradycardia with left bundle branch block.   IMPRESSION:  1. Coronary disease with prior revascularization surgery as noted.  2. Hypercholesterolemia on lipid lowering therapy.  3. Abdominal aortic aneurysm.  4. Probable sick sinus syndrome and conduction disease as manifested      by sinus bradycardia and interventricular conduction delay.   PLAN:  1. Continue current medical regimen.  2. Return to clinic in 6 months.  3. Lipid and liver profile at that  time, CBC, and abdominal ultrasound      to re-evaluate the abdominal aneurysm.     Arturo Morton. Riley Kill, MD, [facility]  Electronically Signed    TDS/MedQ  DD: 03/26/2008  DT: 03/26/2008  Job #: [id number]   cc:   Lonzo Cloud. Kriste Basque, MD

## 2011-04-12 NOTE — Assessment & Plan Note (Signed)
[facility]                            CARDIOLOGY OFFICE NOTE   NAME:Daniel Reeves, Daniel Reeves                      MRN:          [id number]  DATE:11/13/2008                            DOB:          1924-07-08    Returns in for followup.  He generally is stable.  He has not been  having any ongoing chest pain or progressive shortness of breath.  In  general, he really does quite well.  He has little bit of leg cramps,  but this has not been progressive or serious type of discomfort.  In  general, he feels well.   MEDICATIONS:  1. Benicar 10 mg daily.  2. Aspirin 81 mg daily.  3. Osteo Bi-Flex one b.i.d.  4. Centrum daily.  5. Vitamin E 200 international units daily.  6. Folic acid 400 mcg b.i.d.  7. Simvastatin 40 mg daily.  8. Allopurinol 300 mg daily.   PHYSICAL EXAMINATION:  GENERAL:  He is alert and oriented in no  distress.  VITAL SIGNS:  Blood pressure is 124/80, pulse is 56.  LUNG:  The lung fields are clear.  HEART:  There is paradoxical splitting of a second heart sound.  ABDOMEN:  Soft.  EXTREMITIES:  No edema.   IMPRESSION:  1. Coronary artery disease status post coronary artery bypass graft      surgery x4 in 1979.  2. Status post redo coronary bypass graft surgery x5 in 1993 by Dr.      Andrey Campanile.  3. Status post right inguinal hernia repair in 1994, 1996, by Dr.      Lovell Sheehan.  4. Status post decompression laminectomy, Dr. Simonne Come.  5. Hypercholesterolemia.  6. History of diverticulosis.  7. Benign metastatic hypertrophy.   PLAN:  1. Return to clinic in one year.  2. Continue current medical regimen.  3. Check CPK.   ADDENDUM:  EKG reveals left bundle branch block with resting heart rate  of 55.     Arturo Morton. Riley Kill, MD, [facility]  Electronically Signed    TDS/MedQ  DD: 11/13/2008  DT: 11/14/2008  Job #: [id number]

## 2011-04-15 NOTE — Assessment & Plan Note (Signed)
[facility]                           GASTROENTEROLOGY OFFICE NOTE   NAME:Daniel Reeves, Daniel Reeves                      MRN:          [id number]  DATE:08/07/2006                            DOB:          January 12, 1924    Patient in to discuss having a colonoscopic examination.  He says he has had  problems with hemorrhoids in the past.  He also had, over 10 years ago, some  esophageal ulcers.  He takes a half of a Zantac now and seems to control his  symptoms in this manner.  He said he has had many sigmoidoscopies by Dr. Mariah Milling in the past but nothing of significance was seen.  Never had a  colonoscopic examination.  He is a basically extremely healthy gentleman of  77 years of age.  His only medical problems have been some hypertension,  hyperlipidemia.  He did have hernia surgery by Dr. Gerrit Friends twice and  hemorrhoid surgery, and he has had bypass x2.   He is a retired Art gallery manager, although he is thinking of coming back to work at  [employer] again, 77 years of age.   His review of systems is basically noncontributory.   FAMILY HISTORY:  Noncontributory.   PHYSICAL EXAMINATION:  GENERAL:  He is a healthy-looking 77 year old.  VITAL SIGNS:  Six feet.  Weighs 178.  Blood pressure 98/54, pulse 56 and  regular.  HEENT:  Oropharynx was negative.  NECK:  Negative.  CHEST:  Clear except for occasional expiratory wheezing but only minimal.  HEART:  Regular rhythm.  ABDOMEN:  Soft.  Flat.  No masses or organomegaly.  Nontender.  No bruits or  rubs.  EXTREMITIES:  Unremarkable.  RECTAL:  Deferred.   IMPRESSION:  1. Hypertension.  2. History of esophageal ulcers.  3. Hyperlipidemia.  4. Atherosclerotic cardiovascular disease, post bypass.  5. Hernia surgery x2.   RECOMMENDATIONS:  Schedule him for a colonoscopic examination and question  doing an EGD on him to rule out Barrett's because of the history of  esophageal ulcers, which he has no clue of why this  occurred and has only  taken a small amount of medicine.  I also gave him some  AcipHex, gave him some __________ about GERD and I will look up his previous  endoscopic reports, if I can find them, to decide whether he needs both endo  and a colonoscopy.                                   Ulyess Mort, MD   SML/MedQ  DD:  08/07/2006  DT:  08/08/2006  Job #:  [id number]   cc:   Arturo Morton. Riley Kill, MD, [facility]  Scott M. Kriste Basque, MD

## 2011-04-15 NOTE — Assessment & Plan Note (Signed)
[facility]                             PULMONARY OFFICE NOTE   NAME:Daniel Reeves, Daniel Reeves                      MRN:          [id number]  DATE:01/26/2007                            DOB:          27-May-1924    HISTORY OF PRESENT ILLNESS:  The patient is an 77 year old white male  patient of Dr. Jodelle Green who has a known history of hypertension, coronary  artery disease, and hyperlipidemia.  Presents to the office today  complaining of a 2 week history of a palpable nodule on the right breast  with tenderness.  The patient notes that approximately 2 weeks ago he  noticed that he had some tenderness along his right breast area.  Upon  examination he found that he had a palpable small nodule along the  nipple line that was quite tender to touch.  The patient complains that  he has noted increased sensitivity along this area, especially when he  raises his arm, anything brushes across his chest on the right side.  The patient denies any nausea, vomiting, chest pain, weight loss.   PAST MEDICAL HISTORY:  1. Hypertension.  2. Coronary artery disease.  Status post CABG in 1979 and 1993.  3. Gastroesophageal reflux disease.  4. BPH.  5. Gout.  6. Status post right inguinal hernia repair in 1994.  7. Degenerative joint disease and low back pain.  8. Anemia.  9. Colon polyps and diverticulosis, status post last colonoscopy in      October of 2007.   MEDICATIONS:  1. Crestor 20 mg nightly.  2. Folic Acid daily.  3. Allopurinol 300 mg daily.  4. Benicar 20 mg daily.  5. Aspirin 81 mg b.i.d.  6. Centrum Silver daily.  7. Vitamin E daily.  8. Osteo Bi-Flex daily.  9. Glucosamine chondroitin daily.  10.Omeprazole 20 mg daily.  11.Lyrica 50 mg b.i.d.   DRUG ALLERGIES:  METHOCARBAMOL.   PHYSICAL EXAMINATION:  The patient is a very pleasant male in no acute  distress.  He is afebrile with stable vital signs.  O2 saturation is 97%  on room air.  HEENT:   Unremarkable.[name]  NECK:  Supple without cervical adenopathy.  No JVD.  LUNGS:  Sounds are clear to auscultation bilaterally.  CARDIAC:  Regular rate and rhythm with a grade 1/6 systolic murmur.  ABDOMEN:  Soft and nontender, no hepatosplenomegaly.  BREAST EXAM:  Left breast exam is unremarkable.  Right breast exam  reveals a small mobile nodule along the right breast, a fingerbreadth  from the nipple around the 9 to 10 o'clock position.  No palpable  adenopathy is noted bilaterally.  ABDOMEN:  Soft, nontender, no hepatosplenomegaly.  EXTREMITIES:  Warm without any calf tenderness, cyanosis, clubbing, or  edema.  SKIN:  Is without rash.   IMPRESSION AND PLAN:  Right breast nodule in a male.  The patient will  be referred over to [facility], Dr. Gerrit Friends on February 15, 2007 for evaluation.  The patient in the meantime may use some Tylenol  as needed.      Rubye Oaks, NP  Electronically  Signed      Lonzo Cloud. Kriste Basque, MD  Electronically Signed   TP/MedQ  DD: 01/29/2007  DT: 01/29/2007  Job #: [id number]   cc:   Velora Heckler, MD

## 2011-04-15 NOTE — Assessment & Plan Note (Signed)
[facility]                            CARDIOLOGY OFFICE NOTE   NAME:Lindstrom, GEOFFERY AULTMAN                      MRN:          [id number]  DATE:03/01/2007                            DOB:          [birth year]    Mr. Ignasiak is in for a followup visit.  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  He has been traveling and in the next month he will go on a  [place] cruise.  He is also going to [place].  He is doing some house  maintenance work.  He has not been having any chest pain or any  significant shortness of breath.   CURRENT MEDICATIONS:  Include:  1. Folic Acid daily.  2. Allopurinol 300 mg daily.  3. Ranitidine 1/2 tablet nightly.  4. Aspirin 81 mg daily.  5. Centrum Silver daily.  6. Osteobiflex daily.  7. Glucosamine daily.  8. Omeprazole 20 mg daily.  9. Lisinopril 20 mg daily.  10.Gabapentin 300 mg b.i.d.  11.Simvastatin 40 mg daily.   PHYSICAL EXAMINATION:  The blood pressure  is 120/70, pulse 55.  Weight  188.  LUNGS:  Fields clear.  CARDIAC:  Rhythm is regular.  There is a 1/6 systolic ejection murmur  without gallops or rubs.  The PMI is nondisplaced.  The abdomen does not  reveal widened pulsation despite his known abdominal aortic aneurysm.   Electrocardiogram demonstrates normal sinus rhythm with left bundle  branch block.  The heart rate is 55, consistent with sinus bradycardia.   IMPRESSION:  1. Coronary disease, status post redo coronary artery bypass surgery.  2. Hypercholesterolemia on lipid lower therapy.  3. Small abdominal aortic aneurysm.  4. Hypertension, controlled on medical therapy.   PLAN:  1. Return to clinic in 6 months to 1 year.  2. Continue followup on medical therapy.     Arturo Morton. Riley Kill, MD, [facility]  Electronically Signed    TDS/MedQ  DD: 03/01/2007  DT: 03/01/2007  Job #: [id number]

## 2011-04-15 NOTE — Assessment & Plan Note (Signed)
Daniel Reeves                              CARDIOLOGY OFFICE NOTE   NAME:Daniel Reeves, Daniel Reeves                      MRN:          [id number]  DATE:09/05/2006                            DOB:          11/08/1924    Daniel Reeves is in for followup.  In general, he has been stable.  He has not  been having any ongoing chest pain.  He does notice that when he lays down  at night he can feel a regular pulse and then nothing for a short period.  He denies any syncope or presyncope.   He has retired from his teaching job, and is walking regularly.  He said he  is feeling really quite good.   PHYSICAL EXAMINATION:  The blood pressure is 122/68, the pulse is 50.  LUNGS:  The lung fields are clear.  CARDIAC:  The PMI is nondisplaced.  The median sternotomy is healed.  There  are normal first and second heart sounds.  There is a widely split second  heart sound.  There are no diastolic murmurs noted.  ABDOMEN:  Soft, and I cannot feel a widened pulsation.   The EKG reveals sinus bradycardia with left axis deviation and left bundle  branch block.   IMPRESSION:  1. Coronary artery disease, status post coronary artery bypass graft      surgery.  2. Hypercholesterolemia, on lipid-lowering therapy, at target in August      with an LDL of 61.  3. Small abdominal aortic aneurysm.  4. Moderate bradycardia with personal observation of skipped or missed      beats.   RECOMMENDATIONS:  1. We will do a Holter monitor to make sure that he is not having      significant bradycardia or obvious atrioventricular block.  2. Repeat lipid and liver profile in 6 to 12 months is recommended.  3. Repeat abdominal ultrasound to evaluate abdominal aortic aneurysm size      on annual basis.  4. Continued followup with Dr. Alroy Dust.   ADDENDUM:  Previous hemoglobin had demonstrated a mild anemia.  A followup  hemoglobin, however, was in the 14 range, and was normal.  Stool cards  were  Hemoccult negative.  He has seen Dr. Victorino Dike, and is scheduled for lower  endoscopy.       Daniel Reeves. Riley Kill, MD, [facility]   TDS/MedQ DD:  09/05/2006 DT:  09/07/2006 Job #:  [id number]   cc:   Lonzo Cloud. Kriste Basque, MD

## 2011-04-15 NOTE — Op Note (Signed)
NAMEGRACE, HAGGART NO.:  [id number]   MEDICAL RECORD NO.:  [id number]          PATIENT TYPE:  INP   LOCATION:  X002                         FACILITY:  [facility]   PHYSICIAN:  Marlowe Kays, M.D.  DATE OF BIRTH:  02/10/1924   DATE OF PROCEDURE:  02/09/2006  DATE OF DISCHARGE:                                 OPERATIVE REPORT   PREOPERATIVE DIAGNOSIS:  Central and lateral recess stenosis L2-3, L3-4, L4-  5, L5-S1.   POSTOPERATIVE DIAGNOSIS:  Central and lateral recess stenosis L2-3, L3-4, L4-  5, L5-S1.   OPERATION:  Central and nerve root decompressive laminectomy, L2 to the  sacrum.   SURGEON:  Dr. Simonne Come   ASSISTANT:  Dr. Worthy Rancher   ANESTHESIA:  General.   PATHOLOGY AND JUSTIFICATION FOR PROCEDURE:  He has back and bilateral leg  pain, right perhaps greater than the left, although on the myelogram and CT  scan, defects are worse on the left than the right.  He has scoliosis and  major extradural defects, particularly at L3-4 and L4-5.  Also has disk  bulges at every level.   PROCEDURE:  Prophylactic antibiotics, satisfactory general anesthesia, Foley  catheter inserted, knee-chest position on the Bend frame.  Back was  prepped with DuraPrep, draped in a sterile field, Ioban employed.  Vertical  incision and in the midline to spinous processes and center of the field  were tied with Kocher clamps and noted to be at the L4 and L5 on the lateral  x-ray.  Accordingly, we continued the dissection slightly distally down to  the sacrum and proximally to expose what we felt were the L2 and L3 spinous  processes. These were tagged with Kocher clamps and a second lateral x-ray  taken confirming L2 and L3.  We then began dissecting soft tissue off the  lamina of the entire wound, placed 2 self-retaining McCullough retractors  and began the decompression, removing first the spinous process of most of  L2, L3, L4, and L5 and then worked in the midline with  double-action and 2  and 3 mm Kerrison rongeurs until we had a preliminary decompression  throughout the extent of the wound.  I then brought in the microscope, and  we completed the decompression centrally and foraminally.  He had severe  compression of both bone and ligamentum flavum at L3-4 and L4-5.  The most  sensitive nerve root appeared to be at L3-4 on the right.  The disk bulges  did not appear to be the major component to his pathology and with the dura  being thin and the severity of the spinal stenosis, we noted that we did not  want to create any iatrogenic injury looking at the disks.  There was a bleb  noted which was present without iatrogenic cause at L2-3 on the right, and I  covered this with Surgicel.  At the conclusion of the case, all foramen  appeared to be patent to hockey stick.  There was no dural tear.  The wound  was irrigated with sterile saline and has been throughout the case, and  the  dura and nerve roots were covered with Gelfoam.  I placed a quarter-inch  Penrose drain through the left posterior inferior area and on top of the  Gelfoam.  The self-retaining retractors were removed.  The wound was then  closed in layers with interrupted #1 Vicryl in the paralumbar muscle and in  the fascia.  The subcutaneous with a combination of #1 and #2-0 Vicryl and  the skin with staples.  Betadine, Adaptic, dry sterile dressing were  applied.  He tolerated the procedure well and was taken to the recovery room  in satisfactory condition with no known complications.  Estimated blood loss  was perhaps 350 mL, no blood replacement.           ______________________________  Marlowe Kays, M.D.     JA/MEDQ  D:  02/09/2006  T:  02/10/2006  Job:  [id number]

## 2011-04-15 NOTE — H&P (Signed)
Daniel Reeves, Daniel Reeves               ACCOUNT NO.:  [id number]   MEDICAL RECORD NO.:  [id number]          PATIENT TYPE:  INP   LOCATION:  NA                           FACILITY:  [facility]   PHYSICIAN:  Marlowe Kays, M.D.  DATE OF BIRTH:  07/03/1924   DATE OF ADMISSION:  DATE OF DISCHARGE:                                HISTORY & PHYSICAL   CHIEF COMPLAINT:  Pain in my back and left leg.   HISTORY OF PRESENT ILLNESS:  This 77 year old white male has been seen by [place]  for continuing progressive problems concerning pain into his lumbar spine  with radiation to his left lower extremity.  He is a very active gentleman.  He is a retired Astronomer at [employer].  Unfortunately, he has more  and more difficulty getting about, can only walk a few yards without having  to stop and rest due to his pain in his back.  The radiation down the left  leg at this point in time is quite unbearable to the point where he seeks  intervention.  Myelogram and CT scan have shown a marked stenosis from L2 to  the sacrum.  He has had conservative care with hydrocodone, Skelaxin,  Lyrica, and due to the mechanical nature of present illness, he is highly  desirous to have some relief.  After much consideration and discussion and  after Dr. Simonne Come discussing the risks and benefits of surgery with the  patient, it was decided to go ahead with a central decompressive lumbar  laminectomy from the L2 to the S1 level.   PAST MEDICAL HISTORY:  This gentleman has been in relatively good health and  is tended to by Dr. Alroy Dust his internal medicine physician.  Dr.  Riley Kill is his cardiologist.  Currently he is being treated for:  1.  Hypertension.  2.  Coronary artery disease.  3.  He also has reflux.  4.  History of ulcer disease.  5.  Benign prostatic hypertrophy.   CURRENT MEDICATIONS:  1.  Aspirin 81 mg b.i.d. (will stop prior to surgery).  2.  Benicar 20 mg q.a.m.  3.  Allopurinol 300 mg q.a.m.  4.   Lyrica 75 mg b.i.d.  5.  Centrum one tab q.a.m.  6.  Vitamin E 200 international units q.a.m. (will stop prior to surgery).  7.  Crestor 200 mg in the p.m.  8.  Ranitidine 1/2 tab of a 300 mg tab in the p.m.  9.  Folic acid 400 mcg in the p.m. (two tablets in the p.m.).  10. Vicodin for pain.  11. Skelaxin as a muscle relaxant.   He has no medical allergies.   PAST SURGERIES:  1.  Coronary artery bypass graft in 1979 and in 1993.  2.  He has had bilateral herniorrhaphies x2, the last ones with mesh.   FAMILY HISTORY:  Noncontributory.   SOCIAL HISTORY:  The patient is married, retired.  He has no intake of  tobacco products.  He has two alcoholic beverages a day.  His spouse will be  a major caregiver after the surgery.  They live in a one-level ranch home.   REVIEW OF SYSTEMS:  CNS:  No seizure disorder, paralysis, double vision.  The patient does have numbness tingling consistent with radiculitis form the  present illness.  CARDIOVASCULAR:  No chest pain, no angina, no orthopnea.  RESPIRATORY:  No productive cough, no hemoptysis, no shortness of breath.  GASTROINTESTINAL:  No nausea, vomiting, melena, bloody stools.  GENITOURINARY:  No discharge, dysuria, hematuria but he does have slow  stream secondary to prostate hypertrophy and Dr. Annabell Howells is his urologist.   PHYSICAL EXAMINATION:  GENERAL:  Alert and cooperative, friendly, fully  alert, well dressed, oriented, 77 year old white male who is accompanied by  his wife, Daniel Reeves.  VITAL SIGNS:  Blood pressure  112/60, pulse 60, respirations 12.  HEENT:  Normocephalic.  PERLA.  EOMs intact.  Oropharynx is clear.  CHEST:  Clear to auscultation.  No rhonchi.  No rales.  Expiratory wheezes  are noted at end expiration.  HEART:  Regular rate and rhythm.  ABDOMEN:  Soft, nontender.  Liver spleen not felt.  GENITALIA/RECTAL:  Not done.  Not pertinent to present illness.  EXTREMITIES:  The patient has negative straight leg raise  bilaterally.   ADMITTING DIAGNOSES:  1.  Spinal stenosis L2-L3, L3-L4, L4-L5, L5-S1.  2.  Coronary artery disease.  3.  Benign prostatic hypertrophy.  4.  Hypertension.  5.  Reflux.  6.  Abdominal aortic aneurysm.   PLAN:  The patient is to have a nuclear stress test by Dr. Riley Kill to clear  him for this surgical procedure.  He has been cleared medically by Dr. Alroy Dust.  He will in all probability have a 3-4 day hospital stay.  Should we  have any medical problems, we will certainly call Dr. Alroy Dust, and  should we have any cardiac problems, we will call Dr. Riley Kill.  The patient  is to be admitted to [facility] on February 09, 2006 for the above  procedure.      Dooley L. Cherlynn June.    ______________________________  Marlowe Kays, M.D.    DLU/MEDQ  D:  02/03/2006  T:  02/03/2006  Job:  [id number]   cc:   Lonzo Cloud. Kriste Basque, M.D. [facility]  [address]  [place]  [place] [address]   Arturo Morton. Riley Kill, M.D. [facility]  [address]  [address]  [place]  [place] [address]

## 2011-04-29 ENCOUNTER — Encounter: Payer: Self-pay | Admitting: Cardiology

## 2011-04-29 DIAGNOSIS — I2510 Atherosclerotic heart disease of native coronary artery without angina pectoris: Secondary | ICD-10-CM | POA: Insufficient documentation

## 2011-05-18 ENCOUNTER — Ambulatory Visit (INDEPENDENT_AMBULATORY_CARE_PROVIDER_SITE_OTHER): Payer: Medicare Other | Admitting: Cardiology

## 2011-05-18 ENCOUNTER — Encounter: Payer: Self-pay | Admitting: Cardiology

## 2011-05-18 DIAGNOSIS — I714 Abdominal aortic aneurysm, without rupture, unspecified: Secondary | ICD-10-CM

## 2011-05-18 DIAGNOSIS — I2510 Atherosclerotic heart disease of native coronary artery without angina pectoris: Secondary | ICD-10-CM

## 2011-05-18 DIAGNOSIS — E7800 Pure hypercholesterolemia, unspecified: Secondary | ICD-10-CM

## 2011-05-18 NOTE — Patient Instructions (Signed)
Your physician wants you to follow-up in: 1 year with Dr. Riley Kill. You will receive a reminder letter in the mail two months in advance. If you don't receive a letter, please call our office to schedule the follow-up appointment. Your physician recommends that you return for fasting lab work tomorrow or Friday.

## 2011-05-19 ENCOUNTER — Other Ambulatory Visit (INDEPENDENT_AMBULATORY_CARE_PROVIDER_SITE_OTHER): Payer: Medicare Other | Admitting: *Deleted

## 2011-05-19 DIAGNOSIS — E7800 Pure hypercholesterolemia, unspecified: Secondary | ICD-10-CM

## 2011-05-19 LAB — LIPID PANEL
LDL Cholesterol: 56 mg/dL (ref 0–99)
VLDL: 13.4 mg/dL (ref 0.0–40.0)

## 2011-05-19 LAB — HEPATIC FUNCTION PANEL
Bilirubin, Direct: 0.2 mg/dL (ref 0.0–0.3)
Total Bilirubin: 1 mg/dL (ref 0.3–1.2)

## 2011-06-01 NOTE — Assessment & Plan Note (Signed)
Do for follow up in December of this year.

## 2011-06-01 NOTE — Assessment & Plan Note (Signed)
Prior CABG and no progression of symptoms.  Would take marked change to want to do another evaluation in the absence of significant symptoms.

## 2011-06-01 NOTE — Progress Notes (Signed)
HPI:  We spent a lot of time on his issues today.  We specifically discussed some of his more recent studies, and he has not been told about some of them.  He has quite a few issues going on at present.  Denies chest pain at this point.  Has AAA and due for follow up in Dec of 2012.    Current Outpatient Prescriptions  Medication Sig Dispense Refill  . allopurinol (ZYLOPRIM) 300 MG tablet Take 300 mg by mouth daily.        [name] aspirin (ADULT ASPIRIN EC LOW STRENGTH) 81 MG EC tablet Take 81 mg by mouth daily.        . Cholecalciferol (VITAMIN D-3 PO) Take by mouth daily.        [name] dutasteride (AVODART) 0.5 MG capsule Take 0.5 mg by mouth as directed.        . folic acid (FOLVITE) 400 MCG tablet Take 400 mcg by mouth 2 (two) times daily.        . methylcellulose (CITRUCEL) oral powder Take by mouth daily.        . Misc Natural Products (OSTEO BI-FLEX ADV JOINT SHIELD) TABS Take 1 tablet by mouth 2 (two) times daily.        . Multiple Vitamins-Minerals (CENTRUM SILVER PO) Take 1 tablet by mouth daily.        . nitroGLYCERIN (NITROSTAT) 0.4 MG SL tablet Place 0.4 mg under the tongue as directed.        . olmesartan (BENICAR) 20 MG tablet Take 10 mg by mouth daily.        . simvastatin (ZOCOR) 40 MG tablet Take 40 mg by mouth at bedtime.          Allergies  Allergen Reactions  . Lisinopril     REACTION: dizziness  . Methocarbamol     REACTION: pt states "dizzy"  . Pregabalin     REACTION: pt states "dizzy"  . Ramipril     REACTION: hives and dizziness    Past Medical History  Diagnosis Date  . Cerumen impaction     Bilateral  . Hypertension   . CAD (coronary artery disease)   . Peripheral vascular disease   . Abdominal aortic aneurysm   . Hypercholesterolemia   . GERD (gastroesophageal reflux disease)   . Diverticulosis of colon   . Right flank pain   . Amebic dysentery   . Benign prostatic hypertrophy   . Degenerative joint disease   . Gout   . Lumbar back pain   . Anxiety      Past Surgical History  Procedure Date  . Coronary artery bypass graft 1979    x4  . Coronary artery bypass graft 1993    Redo x5 by Dr Gerrit Friends  . Inguinal hernia repair 1994    Right by Dr Gerrit Friends  . Inguinal hernia repair 1996    Left by Dr. Gerrit Friends  . Decompressive laminectomy 01/2006    L2 - scarum by Dr. Simonne Come    Family History  Problem Relation Age of Onset  . Parkinsonism Brother     History   Social History  . Marital Status: Married    Spouse Name: N/A    Number of Children: N/A  . Years of Education: N/A   Occupational History  . 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 Smoking status: Former Smoker    Quit date: 11/28/1944  .  Smokeless tobacco: Not on file  . Alcohol Use: 7.0 oz/week    14 drink(s) per week  . Drug Use: Not on file  . Sexually Active: Not on file   Other Topics Concern  . Not on file   Social History Narrative   Married7 children    ROS: Please see the HPI.  All other systems reviewed and negative.  PHYSICAL EXAM:  BP 124/62  Pulse 61  Resp 16  Ht 6' (1.829 m)  Wt 178 lb (80.74 kg)  BMI 24.14 kg/m2  General: Well developed, well nourished, in no acute distress. Head:  Normocephalic and atraumatic. Chest:  MS healed.  Neck: no JVD Lungs: Clear to auscultation and percussion. Heart: Normal S1 and S2.  No murmur, rubs or gallops.  Abdomen:  Normal bowel sounds; soft; non tender; no organomegaly Pulses: Pulses normal in all 4 extremities. Extremities: No clubbing or cyanosis. No edema. Neurologic: Alert and oriented x 3.  EKG:  NSR.  LBBB.    ASSESSMENT AND PLAN:

## 2011-06-01 NOTE — Assessment & Plan Note (Signed)
LDL is well controlled on simvastatin.

## 2011-07-01 ENCOUNTER — Telehealth: Payer: Self-pay | Admitting: Cardiology

## 2011-07-01 NOTE — Telephone Encounter (Signed)
Patient states he has been having some muscle quivering  on  left side of his chest on minor pectoral muscle, like a snake or something is moving under the skin. Patient denies chest pain, Sob or any other symptoms. Pt was advised to call his PCP Dr Kriste Basque for recommendations, he verbalized understanding.

## 2011-07-01 NOTE — Telephone Encounter (Signed)
Pt's muscle on the left side of his chest is quivering and pt wants to know is that some sort of sign since he has had bypass before

## 2011-07-05 ENCOUNTER — Other Ambulatory Visit: Payer: Self-pay | Admitting: *Deleted

## 2011-07-05 MED ORDER — ALLOPURINOL 300 MG PO TABS: 300.0000 mg | ORAL_TABLET | Freq: Every day | ORAL | Status: DC

## 2011-08-17 ENCOUNTER — Encounter: Payer: Self-pay | Admitting: Pulmonary Disease

## 2011-08-17 ENCOUNTER — Ambulatory Visit (INDEPENDENT_AMBULATORY_CARE_PROVIDER_SITE_OTHER): Payer: Medicare Other | Admitting: Pulmonary Disease

## 2011-08-17 DIAGNOSIS — M109 Gout, unspecified: Secondary | ICD-10-CM

## 2011-08-17 DIAGNOSIS — I739 Peripheral vascular disease, unspecified: Secondary | ICD-10-CM

## 2011-08-17 DIAGNOSIS — E7800 Pure hypercholesterolemia, unspecified: Secondary | ICD-10-CM

## 2011-08-17 DIAGNOSIS — M1990 Unspecified osteoarthritis, unspecified site: Secondary | ICD-10-CM

## 2011-08-17 DIAGNOSIS — K219 Gastro-esophageal reflux disease without esophagitis: Secondary | ICD-10-CM

## 2011-08-17 DIAGNOSIS — Z87898 Personal history of other specified conditions: Secondary | ICD-10-CM

## 2011-08-17 DIAGNOSIS — I2510 Atherosclerotic heart disease of native coronary artery without angina pectoris: Secondary | ICD-10-CM

## 2011-08-17 DIAGNOSIS — N200 Calculus of kidney: Secondary | ICD-10-CM

## 2011-08-17 DIAGNOSIS — I10 Essential (primary) hypertension: Secondary | ICD-10-CM

## 2011-08-17 DIAGNOSIS — I714 Abdominal aortic aneurysm, without rupture: Secondary | ICD-10-CM

## 2011-08-17 DIAGNOSIS — D126 Benign neoplasm of colon, unspecified: Secondary | ICD-10-CM

## 2011-08-17 DIAGNOSIS — K5730 Diverticulosis of large intestine without perforation or abscess without bleeding: Secondary | ICD-10-CM

## 2011-08-17 HISTORY — DX: Calculus of kidney: N20.0

## 2011-08-17 MED ORDER — LOSARTAN POTASSIUM 50 MG PO TABS: 50.0000 mg | ORAL_TABLET | Freq: Every day | ORAL | Status: DC

## 2011-08-17 MED ORDER — ALLOPURINOL 300 MG PO TABS: 300.0000 mg | ORAL_TABLET | Freq: Every day | ORAL | Status: DC

## 2011-08-17 MED ORDER — SIMVASTATIN 40 MG PO TABS: 40.0000 mg | ORAL_TABLET | Freq: Every day | ORAL | Status: DC

## 2011-08-17 NOTE — Patient Instructions (Signed)
Today we updated your med list in EPIC...    We re-added the Lialda & we switched the Benicar to Losartan...  Keep up the good work w/ your diet & exercise...  Have a blast on the cruise!!!  Call for any problems...  Let's plan a follw up visit w/ FASTING blood work in about 6 months.Marland KitchenMarland Kitchen

## 2011-08-17 NOTE — Progress Notes (Signed)
Subjective:    Patient ID: Daniel Reeves, male    DOB: 10-19-1924, 77 y.o.   MRN: [id number]  HPI 77 y/o WM here for a follow up visit... he has multiple medical problems including HBP;  CAD followed by [name];  ASPVD w/ 3cm AAA that has been stable;  Hyperchol;  GERD;  Divertics/ Polyps/ ?fecal incontinence;  Hx amebic dysentery yrs ago;  BPH/ BOO/ elev PSA on Avodart & followed by [name];  DJD/ Gout/ hx LBP w/ surg 2007 by [name]...  ~  August 12, 2010:  he saw [name] for GI 4/11- ch in bowel habits, constip & some fecal incontinence; rec incr fiber + colonoscopy- done 4/11w/ mod divertics, otherw neg...  he saw [name] for Cards f/u 6/1- stable, doing well, no changes made...  he saw [name] for GU f/u 7/11- BPH/ BOO/ elev PSA- improved w/ Avodart Q3d, PSA decr to 4, voiding better, side effects improved w/ decr dose...  today several somatic complaints> left elbow pain, tired all the time, occas skips that he sees in his tortuous brachial art but does not sense (rec to decr caffeine)... BP controlled, no angina, remains active, he is not fasting today & wants to wait 62mo before rechecking labs, refuses flu shot.  ~  February 08, 2011:  62mo ROV & c/o being tired, always tired, up at night at least 3 times & this may account for some of the fatigue;  we decided to recheck his full lab work & everything is WNL (x PSA=4.29 followed by [name], & Vit D 37- rec OTC 1000u/d.    He saw [name] 1/12> f/u BPH, BOO, elev PSA on Avodart since 10/10 (w/ PSA decr from 11 to 4);  stable on rx & he will continue Q73mo f/u (also c/o ED- given Levitra trial)...    He saw [name] for GI 1/12>  mod severe left colon divertics, c/o loose stools w/ bl & mucous, given trial Lialda rx + Citrucel daily (less blood seen still some leakage & mucous)...  ~  August 17, 2011:  62mo ROV & he reports doing satis, no new complaints or concerns...    Cardiac> HBP, CAD> followed by [name] on ASA, Losartan; s/p  CABG 79 & 93; BP= 132/68 today, seen 6/12 & note reviewed- doing satis w/o CP, palpit, dizzy, ch in SOB, edema, etc...    ASPVS> known 3cm AAA being followed; he had CTAbd done via ER 4/12= +right kid stone w/ hydroneph; AAA measured 3.2cm, infrarenal, & 3.4cm in length.    CHOL> on Simva40 & FLP 6/12 looked good x sl low HDL (rec incr exercise)...    GI> Gerd, Divertics, Polyps, Hx amebic dysentery> followed byDrJacobs on Lialda- pt decr to 1Qod now & stable BMs...    BPH> followed by [name] who switched off Avodart, on Proscar now but only taking every 3rd day?; denies voiding issues, nocturia diminished to 1-2...    DJD, Gout, LBP> stable on Allopurinol, Osteobiflex, Tylenol...             Problem List:  HYPERTENSION (ICD-401.9) - on LOSARTAN 50mg /d at present... BP today= 132/68, he monitors BP at home... denies HA, visual changes, CP, palipit, dizziness, syncope, edema, etc...   CAD (ICD-414.00) - he takes ASA 81mg /d... followed by [name] w/ hx of 2 prev CABG's in 1979 & 1993...  ~  NuclearStressTest 3/07 showed infer infarct & mild peri-infarct ischemia, abn wall motion & EF=45%... essentially no change from 2003 ~  check up  by [name] Q78mo- [facility], doing satis, no changes made, f/u Abd Ao doppler in Dec.  PERIPHERAL VASCULAR DISEASE (ICD-443.9) - he takes the Aspirin 81mg /d... known ~3cm AAA being followed serially... ~  ABI's 3/06 were normal  ~  CT Abd 12/08 w/ 3.3cm focal saccular AAA in distal abd ao. ((it was 3cm in 05)... ~  AbdAo Doppler 11/08 w/ stable infra-renal fusiform aneurysm ~ 3cm... ~  AbdAo Doppler 12/10 showed stable ~3cm AAA, mod irreg plaque, norm iliacs... f/u 60yr. ~  AbdAo Doppler 12/11 showed stable ~3cm AAA, mod mid-distal Ao stenosis, f/u 31yr. ~  CTAbd 4/12 via ER (right kid stone) showed AAA measured 3.2cm, infrarenal, & 3.4cm in length.  HYPERCHOLESTEROLEMIA (ICD-272.0) - on SIMVASTATIN 40mg /d... ~  FLP 7/08 showed TChol 99, TG 103, HDL  32, LDL 46 ~  FLP 8/09 showed TChol 101, TG 73, HDL 28, LDL 58 ~  FLP 8/10 showed TChol 106, TG 50, HDL 36, LDL 60 ~  FLP 3/12 showed TChol 107, TG 100, HDL 30, LDL 57  GERD (ICD-530.81) - uses OTC meds as needed... last EGD 10/07 by DrSam showed sm HH otherw neg...  ~ 8/09: noted some dysphagia for large pills, offered GI referral but he declined.  DIVERTICULOSIS OF COLON (ICD-562.10) & COLONIC POLYPS (ICD-211.3) ~  colonoscopy 10/07 by DrSam showed divertics, and 7mm polyp= polypoid mucosa on path... f/u 3yrs planned... ~  2/10: noted some IBS symptoms and fecal incontinence, again offered GI referral but he declined. ~  3/11: now he is ready to discuss fecal incontinence w/ gastroenterology> saw [name] 4/11 & rec for icr fiber. ~  Colonoscopy 4/11 by [name] w/ mod divertics, otherw neg- rec Citrucel daily.  FLANK PAIN, RIGHT (ICD-789.09) - he was seen 12/08 c/o Rt flank pain of 6 months duration... he had seen [name] and was unhappy w/ his eval, concerned about kidney stone vs recurrence of amebic dysentery... 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 BoneScan= arthritic uptake all over but none in rt flank area, ribs, etc...  Rx w/ heating pad and ETODOLAC... he reports that his discomfort pretty much resolved and he felt good about the thorough evaluation... ~  8/10:  prev right flank pain resolved- no recurrence. ~  4/12:  ER eval for right sided pain w/ CTAbd showing +right kid stone w/ hydroneph; AAA measured 3.2cm, infrarenal, & 3.4cm in length.  Hx of AMEBIC DYSENTERY (ICD-006.9)  BENIGN PROSTATIC HYPERTROPHY, HX OF (ICD-V13.8) - hx of increased PSA in the 6-8 range and followed by [name]... he has LTOS including the fact that it takes him 12 min to urinate and empty satisfactorily... he has nocturia x 2-3... he tells me that [name]  didn't want to place him on meds, rather he rec a diet for his prostate health w/ fruits + vegetables, low fat, & no white foods like rice etc... his voiding symptoms are improved on this diet, he says... pt has refused cysto & bx of prostate, they are following his PSA's Q48mo. ~  2010: [name] started Avodart when PSA hit 11.7 & it improved to 4.3, but pt c/o fatigue, weak, breast tenderness- so they are decreasing the Avodart to Qod, now Q3rd day (w/ sl improvement in symptoms). ~  7/11:  f/u [name] w/ PSA= 4.09 & symptoms improved on the Avodart Q3d... ~  3/12:  PSA= 4.29 & DrWrennswitched him from Avodart to Proscar 5mg /d...  DEGENERATIVE JOINT DISEASE (ICD-715.90) - he takes OSTEOBIFLEX Bid, + MVI, etc... he manages his mod severe arthritis pain very well and only uses an occas Advil... prev had Etodolac, Tramadol, DCN100- but stopped all of these...  GOUT (ICD-274.9) - on ALLOPURINOL 300mg /d...  ~  labs 2/09 showed Uric = 3.8 ~  labs 8/10 showed Uric = 3.8  BACK PAIN, LUMBAR (ICD-724.2) - s/p decompressive laminectomy 3/07 by [name]...  ANXIETY (ICD-300.00)   Past Surgical History  Procedure Date  . Coronary artery bypass graft 1979    x4  . Coronary artery bypass graft 1993    Redo x5 by Dr Gerrit Friends  . Inguinal hernia repair 1994    Right by Dr Gerrit Friends  . Inguinal hernia repair 1996    Left by Dr. Gerrit Friends  . Decompressive laminectomy 01/2006    L2 - scarum by Dr. Simonne Come    Outpatient Encounter Prescriptions as of 08/17/2011  Medication Sig Dispense Refill  . allopurinol (ZYLOPRIM) 300 MG tablet Take 1 tablet (300 mg total) by mouth daily.  90 tablet  3  . aspirin (ADULT ASPIRIN EC LOW STRENGTH) 81 MG EC tablet Take 81 mg by mouth daily.        . Cholecalciferol (VITAMIN D-3 PO) Take by mouth daily.        . finasteride (PROPECIA) 1 MG tablet Take 1 mg by mouth daily.        . folic acid (FOLVITE) 400 MCG tablet Take 400 mcg by mouth 2 (two) times daily.        .  mesalamine (LIALDA) 1.2 G EC tablet Take one tablet by mouth every other day       . methylcellulose (CITRUCEL) oral powder Take by mouth daily.        . Misc Natural Products (OSTEO BI-FLEX ADV JOINT SHIELD) TABS Take 1 tablet by mouth 2 (two) times daily.        . Multiple Vitamins-Minerals (CENTRUM SILVER PO) Take 1 tablet by mouth daily.        . nitroGLYCERIN (NITROSTAT) 0.4 MG SL tablet Place 0.4 mg under the tongue as directed.        . olmesartan (BENICAR) 20 MG tablet Take 10 mg by mouth daily.        . simvastatin (ZOCOR) 40 MG tablet Take 40 mg by mouth at bedtime.        [name] DISCONTD: dutasteride (AVODART) 0.5 MG capsule Take 0.5 mg by mouth as directed.         Allergies  Allergen Reactions  . Lisinopril     REACTION: dizziness  . Methocarbamol     REACTION: pt states "dizzy"  . Pregabalin     REACTION: pt states "dizzy"  . Ramipril     REACTION: hives and dizziness    Current Medications, Allergies, Past Medical History, Past Surgical History, Family History, and Social History were reviewed in [employer] record.    Review of Systems        See HPI - all other systems neg except as noted...  The patient complains of incontinence and muscle weakness.  The patient denies anorexia, fever, weight loss, weight gain, vision loss, decreased hearing, hoarseness, chest pain, syncope, dyspnea on exertion, peripheral edema, prolonged cough, headaches, hemoptysis, abdominal pain, melena, hematochezia, severe indigestion/heartburn, hematuria, suspicious skin lesions, transient blindness, difficulty walking, depression, unusual weight change, abnormal bleeding, enlarged lymph nodes, and angioedema.  Objective:   Physical Exam      WD, WN, 77 y/o WM in NAD... GENERAL:  Alert & oriented; pleasant & cooperative... HEENT:  [place]/AT, EOM-wnl, PERRLA, EACs-clear, TMs-wnl, NOSE-clear, THROAT-clear & wnl. NECK:  Supple w/ fairROM; no JVD; normal carotid impulses w/o  bruits; no thyromegaly or nodules palpated; no lymphadenopathy. CHEST:  Clear to P & A; without wheezes/ rales/ or rhonchi. HEART:  Regular Rhythm; gr 1/6 SEM, w/o rubs or gallops heard... ABDOMEN:  Soft & nontender; normal bowel sounds; no organomegaly or masses detected. EXT: without deformities, mod arthritic changes (esp hands); no varicose veins/ venous insuffic/ or edema. NEURO:  CN's intact; no focal neuro deficits... DERM:  No lesions noted; no rash etc...   Assessment & Plan:   Cardiac> HBP, CAD> followed by [name] on ASA, Losartan; s/p CABG 79 & 93; BP= 132/68 today, doing satis & continue same meds.     ASPVS> known 3cm AAA being followed; he had CTAbd done via ER 4/12= +right kid stone w/ hydroneph; AAA measured 3.2cm, infrarenal, & 3.4cm in length.     CHOL> on Simva40 & FLP 3/12 & 6/12 looked good x sl low HDL (rec incr exercise)...     GI> Gerd, Divertics, Polyps, Hx amebic dysentery> followed byDrJacobs on Lialda- pt decr to 1Qod now & stable BMs...     BPH> followed by [name] who switched off Avodart, on Proscar now but only taking every 3rd day?; denies voiding issues, nocturia diminished to 1-2...     DJD, Gout, LBP> stable on Allopurinol, Osteobiflex, Tylenol.Marland KitchenMarland Kitchen

## 2011-08-28 ENCOUNTER — Encounter: Payer: Self-pay | Admitting: Pulmonary Disease

## 2011-12-07 ENCOUNTER — Encounter: Payer: Medicare Other | Admitting: Cardiology

## 2011-12-08 ENCOUNTER — Other Ambulatory Visit: Payer: Self-pay | Admitting: Cardiology

## 2011-12-08 DIAGNOSIS — I714 Abdominal aortic aneurysm, without rupture: Secondary | ICD-10-CM

## 2011-12-09 ENCOUNTER — Encounter (INDEPENDENT_AMBULATORY_CARE_PROVIDER_SITE_OTHER): Payer: Medicare Other | Admitting: Cardiology

## 2011-12-09 DIAGNOSIS — I700 Atherosclerosis of aorta: Secondary | ICD-10-CM

## 2011-12-09 DIAGNOSIS — I714 Abdominal aortic aneurysm, without rupture: Secondary | ICD-10-CM

## 2011-12-23 DIAGNOSIS — M1710 Unilateral primary osteoarthritis, unspecified knee: Secondary | ICD-10-CM | POA: Diagnosis not present

## 2012-01-26 DIAGNOSIS — Z961 Presence of intraocular lens: Secondary | ICD-10-CM | POA: Diagnosis not present

## 2012-01-26 DIAGNOSIS — H353190 Nonexudative age-related macular degeneration, unspecified eye, stage unspecified: Secondary | ICD-10-CM | POA: Diagnosis not present

## 2012-02-15 ENCOUNTER — Other Ambulatory Visit (INDEPENDENT_AMBULATORY_CARE_PROVIDER_SITE_OTHER): Payer: Medicare Other

## 2012-02-15 ENCOUNTER — Encounter: Payer: Self-pay | Admitting: Pulmonary Disease

## 2012-02-15 ENCOUNTER — Ambulatory Visit (INDEPENDENT_AMBULATORY_CARE_PROVIDER_SITE_OTHER): Payer: Medicare Other | Admitting: Pulmonary Disease

## 2012-02-15 VITALS — BP 130/70 | HR 60 | Temp 96.7°F | Ht 72.0 in | Wt 183.0 lb

## 2012-02-15 DIAGNOSIS — E7800 Pure hypercholesterolemia, unspecified: Secondary | ICD-10-CM | POA: Diagnosis not present

## 2012-02-15 DIAGNOSIS — R0989 Other specified symptoms and signs involving the circulatory and respiratory systems: Secondary | ICD-10-CM

## 2012-02-15 DIAGNOSIS — I739 Peripheral vascular disease, unspecified: Secondary | ICD-10-CM

## 2012-02-15 DIAGNOSIS — D126 Benign neoplasm of colon, unspecified: Secondary | ICD-10-CM

## 2012-02-15 DIAGNOSIS — M109 Gout, unspecified: Secondary | ICD-10-CM

## 2012-02-15 DIAGNOSIS — F411 Generalized anxiety disorder: Secondary | ICD-10-CM

## 2012-02-15 DIAGNOSIS — K5730 Diverticulosis of large intestine without perforation or abscess without bleeding: Secondary | ICD-10-CM

## 2012-02-15 DIAGNOSIS — R0600 Dyspnea, unspecified: Secondary | ICD-10-CM

## 2012-02-15 DIAGNOSIS — I10 Essential (primary) hypertension: Secondary | ICD-10-CM

## 2012-02-15 DIAGNOSIS — R0609 Other forms of dyspnea: Secondary | ICD-10-CM | POA: Diagnosis not present

## 2012-02-15 DIAGNOSIS — Z87898 Personal history of other specified conditions: Secondary | ICD-10-CM

## 2012-02-15 DIAGNOSIS — I2510 Atherosclerotic heart disease of native coronary artery without angina pectoris: Secondary | ICD-10-CM | POA: Diagnosis not present

## 2012-02-15 DIAGNOSIS — M545 Low back pain: Secondary | ICD-10-CM

## 2012-02-15 DIAGNOSIS — M1990 Unspecified osteoarthritis, unspecified site: Secondary | ICD-10-CM

## 2012-02-15 DIAGNOSIS — K219 Gastro-esophageal reflux disease without esophagitis: Secondary | ICD-10-CM

## 2012-02-15 LAB — CBC WITH DIFFERENTIAL/PLATELET
Basophils Relative: 0.5 % (ref 0.0–3.0)
Eosinophils Absolute: 0.2 10*3/uL (ref 0.0–0.7)
Eosinophils Relative: 3.2 % (ref 0.0–5.0)
Hemoglobin: 14.3 g/dL (ref 13.0–17.0)
Lymphocytes Relative: 23.1 % (ref 12.0–46.0)
MCHC: 34 g/dL (ref 30.0–36.0)
Monocytes Relative: 8.6 % (ref 3.0–12.0)
Neutro Abs: 4.6 10*3/uL (ref 1.4–7.7)
Neutrophils Relative %: 64.6 % (ref 43.0–77.0)
RBC: 4.27 Mil/uL (ref 4.22–5.81)
WBC: 7.1 10*3/uL (ref 4.5–10.5)

## 2012-02-15 LAB — LIPID PANEL
Cholesterol: 97 mg/dL (ref 0–200)
HDL: 40 mg/dL (ref >39.00)
LDL Cholesterol: 43 mg/dL (ref 0–99)
Total CHOL/HDL Ratio: 2
Triglycerides: 72 mg/dL (ref 0.0–149.0)

## 2012-02-15 LAB — HEPATIC FUNCTION PANEL
Bilirubin, Direct: 0.2 mg/dL (ref 0.0–0.3)
Total Protein: 7.2 g/dL (ref 6.0–8.3)

## 2012-02-15 LAB — BASIC METABOLIC PANEL
BUN: 21 mg/dL (ref 6–23)
CO2: 24 mEq/L (ref 19–32)
Calcium: 9.2 mg/dL (ref 8.4–10.5)
Creatinine, Ser: 1.1 mg/dL (ref 0.4–1.5)

## 2012-02-15 LAB — BRAIN NATRIURETIC PEPTIDE: Pro B Natriuretic peptide (BNP): 247 pg/mL — ABNORMAL HIGH (ref 0.0–100.0)

## 2012-02-15 NOTE — Progress Notes (Signed)
Subjective:    Patient ID: Daniel Reeves, male    DOB: Sep 07, 1924, 78 y.o.   MRN: [id number]  HPI 78 y/o WM here for a follow up visit... he has multiple medical problems including HBP;  CAD followed by [name];  ASPVD w/ 3cm AAA that has been stable;  Hyperchol;  GERD;  Divertics/ Polyps/ ?fecal incontinence;  Hx amebic dysentery yrs ago;  BPH/ BOO/ elev PSA on Avodart & followed by [name];  DJD/ Gout/ hx LBP w/ surg 2007 by [name]...  ~  August 12, 2010:  he saw [name] for GI 4/11- ch in bowel habits, constip & some fecal incontinence; rec incr fiber + colonoscopy- done 4/11w/ mod divertics, otherw neg...  he saw [name] for Cards f/u 6/1- stable, doing well, no changes made...  he saw [name] for GU f/u 7/11- BPH/ BOO/ elev PSA- improved w/ Avodart Q3d, PSA decr to 4, voiding better, side effects improved w/ decr dose...  today several somatic complaints> left elbow pain, tired all the time, occas skips that he sees in his tortuous brachial art but does not sense (rec to decr caffeine)... BP controlled, no angina, remains active, he is not fasting today & wants to wait 78mo before rechecking labs, refuses flu shot.  ~  February 08, 2011:  78mo ROV & c/o being tired, always tired, up at night at least 3 times & this may account for some of the fatigue;  we decided to recheck his full lab work & everything is WNL (x PSA=4.29 followed by [name], & Vit D 37- rec OTC 1000u/d.    He saw [name] 1/12> f/u BPH, BOO, elev PSA on Avodart since 10/10 (w/ PSA decr from 11 to 4);  stable on rx & he will continue Q38mo f/u (also c/o ED- given Levitra trial)...    He saw [name] for GI 1/12>  mod severe left colon divertics, c/o loose stools w/ bl & mucous, given trial Lialda rx + Citrucel daily (less blood seen still some leakage & mucous)...  ~  August 17, 2011:  78mo ROV & he reports doing satis, no new complaints or concerns...    Cardiac> HBP, CAD> followed by [name] on ASA, Losartan; s/p  CABG 79 & 93; BP= 132/68 today, seen 6/12 & note reviewed- doing satis w/o CP, palpit, dizzy, ch in SOB, edema, etc...    ASPVS> known 3cm AAA being followed; he had CTAbd done via ER 4/12= +right kid stone w/ hydroneph; AAA measured 3.2cm, infrarenal, & 3.4cm in length.    CHOL> on Simva40 & FLP 6/12 looked good x sl low HDL (rec incr exercise)...    GI> Gerd, Divertics, Polyps, Hx amebic dysentery> followed byDrJacobs on Lialda- pt decr to 1Qod now & stable BMs...    BPH> followed by [name] who switched off Avodart, on Proscar now but only taking every 3rd day?; denies voiding issues, nocturia diminished to 1-2...    DJD, Gout, LBP> stable on Allopurinol, Osteobiflex, Tylenol...  ~  February 15, 2012:  78mo ROV & [name]'s CC is fatigue, tired all the time, no energy- yet he looks fine, exam is neg & labs unrevealing; asked to gradually incr his exercise program to build his stamina, consider Silver Sneakers, etc...     Cardiac> HBP, CAD> followed by [name] on ASA, Losartan; s/p CABG 79 & 93; BP= 130/70 today; seen 6/12 & note reviewed- doing satis w/o CP, syncope, ch in SOB, edema, etc; he notes occas palpit & will be due  for a yearly check up by [name] soon...    ASPVS> known 3cm AAA being followed; he had CTAbd done via ER 4/12= +right kid stone w/ hydroneph; AAA measured 3.2cm, infrarenal, & 3.4cm in length; f/u AbdSonar 1/13 w/ AAA measuring 3cm, no change (f/u rec 47yr).    CHOL> on Simva40 & FLP showed TChol 97, TG 72, HDL 40, LDL 43    GI> Gerd, Divertics, Polyps, Hx amebic dysentery> followed byDrJacobs on Lialda- pt decr to 1Qod now & stable BMs...    BPH> followed by [name] (he does PSA) & switched off Avodart, on Proscar now but only taking every 3rd day?; denies voiding issues, nocturia diminished to 1-2...    DJD, Gout, LBP> stable on Allopurinol, Osteobiflex, Tylenol... LABS 3/13:  FLP- at goals on Simva40;  Chems- wnl;  CBC- wnl;  TSH=2.67;  BNP=247          Problem  List:  HYPERTENSION (ICD-401.9) - on LOSARTAN 50mg /d at present... BP today= 132/68, he monitors BP at home... denies HA, visual changes, CP, palipit, dizziness, syncope, edema, etc... ~  Last CXR 6/10 showed stable heart size s/p CABG, clear lungs, aortic calcif seen, NAD...  CAD (ICD-414.00) - he takes ASA 81mg /d... followed by [name] w/ hx of 2 prev CABG's in 1979 & 1993...  ~  NuclearStressTest 3/07 showed infer infarct & mild peri-infarct ischemia, abn wall motion & EF=45%... essentially no change from 2003 ~  check up by [name] Q85mo- [facility], doing satis, no changes made, f/u Abd Ao doppler in Dec.  PERIPHERAL VASCULAR DISEASE (ICD-443.9) - he takes the Aspirin 81mg /d... known ~3cm AAA being followed serially... ~  ABI's 3/06 were normal  ~  CT Abd 12/08 w/ 3.3cm focal saccular AAA in distal abd ao. ((it was 3cm in 05)... ~  AbdAo Doppler 11/08 w/ stable infra-renal fusiform aneurysm ~ 3cm... ~  AbdAo Doppler 12/10 showed stable ~3cm AAA, mod irreg plaque, norm iliacs... f/u 77yr. ~  AbdAo Doppler 12/11 showed stable ~3cm AAA, mod mid-distal Ao stenosis, f/u 67yr. ~  CTAbd 4/12 via ER (right kid stone) showed AAA measured 3.2cm, infrarenal, & 3.4cm in length.  HYPERCHOLESTEROLEMIA (ICD-272.0) - on SIMVASTATIN 40mg /d... ~  FLP 7/08 showed TChol 99, TG 103, HDL 32, LDL 46 ~  FLP 8/09 showed TChol 101, TG 73, HDL 28, LDL 58 ~  FLP 8/10 showed TChol 106, TG 50, HDL 36, LDL 60 ~  FLP 3/12 showed TChol 107, TG 100, HDL 30, LDL 57 ~  FLP 3/13 on Simva40 showed TChol 97, TG 72, HDL 40, LDL 43  GERD (ICD-530.81) - uses OTC meds as needed... last EGD 10/07 by DrSam showed sm HH otherw neg...  ~ 8/09: noted some dysphagia for large pills, offered GI referral but he declined.  DIVERTICULOSIS OF COLON (ICD-562.10) & COLONIC POLYPS (ICD-211.3) ~  colonoscopy 10/07 by DrSam showed divertics, and 7mm polyp= polypoid mucosa on path... f/u 72yrs planned... ~  2/10: noted some IBS  symptoms and fecal incontinence, again offered GI referral but he declined. ~  3/11: now he is ready to discuss fecal incontinence w/ gastroenterology> saw [name] 4/11 & rec for icr fiber. ~  Colonoscopy 4/11 by [name] w/ mod divertics, otherw neg- rec Citrucel daily.  FLANK PAIN, RIGHT (ICD-789.09) - he was seen 12/08 c/o Rt flank pain of 6 months duration... he had seen [name] and was unhappy w/ his eval, concerned about kidney stone vs recurrence of amebic dysentery... 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 BoneScan= arthritic uptake all over but none in rt flank area, ribs, etc...  Rx w/ heating pad and ETODOLAC... he reports that his discomfort pretty much resolved and he felt good about the thorough evaluation... ~  8/10:  prev right flank pain resolved- no recurrence. ~  4/12:  ER eval for right sided pain w/ CTAbd showing +right kid stone w/ hydroneph; AAA measured 3.2cm, infrarenal, & 3.4cm in length.  Hx of AMEBIC DYSENTERY (ICD-006.9)  BENIGN PROSTATIC HYPERTROPHY, HX OF (ICD-V13.8) - hx of increased PSA in the 6-8 range and followed by [name]... he has LTOS including the fact that it takes him 12 min to urinate and empty satisfactorily... he has nocturia x 2-3... he tells me that [name] didn't want to place him on meds, rather he rec a diet for his prostate health w/ fruits + vegetables, low fat, & no white foods like rice etc... his voiding symptoms are improved on this diet, he says... pt has refused cysto & bx of prostate, they are following his PSA's Q20mo. ~  2010: [name] started Avodart when PSA hit 11.7 & it improved to 4.3, but pt c/o fatigue, weak, breast tenderness- so they are decreasing the Avodart to Qod, now Q3rd day (w/ sl improvement in symptoms). ~  7/11:  f/u [name] w/ PSA= 4.09 & symptoms improved on the Avodart  Q3d... ~  3/12:  PSA= 4.29 & [name] switched him from Avodart to Proscar 5mg /d...  DEGENERATIVE JOINT DISEASE (ICD-715.90) - he takes OSTEOBIFLEX Bid, + MVI, etc... he manages his mod severe arthritis pain very well and only uses an occas Advil... prev had Etodolac, Tramadol, DCN100- but stopped all of these...  GOUT (ICD-274.9) - on ALLOPURINOL 300mg /d...  ~  labs 2/09 showed Uric = 3.8 ~  labs 8/10 showed Uric = 3.8  BACK PAIN, LUMBAR (ICD-724.2) - s/p decompressive laminectomy 3/07 by [name]...  ANXIETY (ICD-300.00)   Past Surgical History  Procedure Date  . Coronary artery bypass graft 1979    x4  . Coronary artery bypass graft 1993    Redo x5 by Dr Gerrit Friends  . Inguinal hernia repair 1994    Right by Dr Gerrit Friends  . Inguinal hernia repair 1996    Left by Dr. Gerrit Friends  . Decompressive laminectomy 01/2006    L2 - scarum by Dr. Simonne Come    Outpatient Encounter Prescriptions as of 02/15/2012  Medication Sig Dispense Refill  . allopurinol (ZYLOPRIM) 300 MG tablet Take 1 tablet (300 mg total) by mouth daily.  90 tablet  3  . aspirin (ADULT ASPIRIN EC LOW STRENGTH) 81 MG EC tablet Take 81 mg by mouth daily.        . Cholecalciferol (VITAMIN D-3 PO) Take by mouth daily.        . finasteride (PROPECIA) 1 MG tablet Take 1 mg by mouth every 3 (three) days.       . folic acid (FOLVITE) 400 MCG tablet Take 400 mcg by mouth 2 (two) times daily.        [name] losartan (COZAAR) 50 MG tablet Take 1 tablet (50 mg total) by mouth daily.  90 tablet  3  . mesalamine (LIALDA) 1.2 G EC tablet Take one tablet by mouth every other day       . methylcellulose (CITRUCEL) oral powder Take by mouth daily.        [name]  Misc Natural Products (OSTEO BI-FLEX ADV JOINT SHIELD) TABS Take 1 tablet by mouth 2 (two) times daily.        . Multiple Vitamins-Minerals (CENTRUM SILVER PO) Take 1 tablet by mouth daily.        . nitroGLYCERIN (NITROSTAT) 0.4 MG SL tablet Place 0.4 mg under the tongue as directed.        .  simvastatin (ZOCOR) 40 MG tablet Take 1 tablet (40 mg total) by mouth at bedtime.  90 tablet  3    Allergies  Allergen Reactions  . Lisinopril     REACTION: dizziness  . Methocarbamol     REACTION: pt states "dizzy"  . Pregabalin     REACTION: pt states "dizzy"  . Ramipril     REACTION: hives and dizziness    Current Medications, Allergies, Past Medical History, Past Surgical History, Family History, and Social History were reviewed in [employer] record.    Review of Systems        See HPI - all other systems neg except as noted...  The patient complains of incontinence and muscle weakness.  The patient denies anorexia, fever, weight loss, weight gain, vision loss, decreased hearing, hoarseness, chest pain, syncope, dyspnea on exertion, peripheral edema, prolonged cough, headaches, hemoptysis, abdominal pain, melena, hematochezia, severe indigestion/heartburn, hematuria, suspicious skin lesions, transient blindness, difficulty walking, depression, unusual weight change, abnormal bleeding, enlarged lymph nodes, and angioedema.     Objective:   Physical Exam      WD, WN, 78 y/o WM in NAD... GENERAL:  Alert & oriented; pleasant & cooperative... HEENT:  [employer]/AT, EOM-wnl, PERRLA, EACs-clear, TMs-wnl, NOSE-clear, THROAT-clear & wnl. NECK:  Supple w/ fairROM; no JVD; normal carotid impulses w/o bruits; no thyromegaly or nodules palpated; no lymphadenopathy. CHEST:  Clear to P & A; without wheezes/ rales/ or rhonchi. HEART:  Regular Rhythm; gr 1/6 SEM, w/o rubs or gallops heard... ABDOMEN:  Soft & nontender; normal bowel sounds; no organomegaly or masses detected. EXT: without deformities, mod arthritic changes (esp hands); no varicose veins/ venous insuffic/ or edema. NEURO:  CN's intact; no focal neuro deficits... DERM:  No lesions noted; no rash etc...  RADIOLOGY DATA:  Reviewed in the EPIC EMR & discussed w/ the patient...  LABORATORY DATA:  Reviewed in the  EPIC EMR & discussed w/ the patient...    >>LABS 3/13:  FLP- at goals on Simva40;  Chems- wnl;  CBC- wnl;  TSH=2.67;  BNP=247   Assessment & Plan:   Cardiac> HBP, CAD> followed by [name] on ASA, Losartan; s/p CABG 79 & 93; BP controlled, Cardiac stable, notes few palpit & due for f/u w/ [name] soon.     ASPVS> known 3cm AAA being followed; CTAbd done via ER 4/12= +right kid stone w/ hydroneph; AAA measured 3.2cm, infrarenal, & 3.4cm in length; Sonar 1/13 stable- no change in 3cm AAA (f/u planned 35yr)...     CHOL> on Simva40 & FLP at goals & seems to be tol med well...     GI> Gerd, Divertics, Polyps, Hx amebic dysentery> followed byDrJacobs on Lialda- pt decr to 1Qod now & stable BMs...     BPH> followed by [name] who switched off Avodart, on Proscar now but only taking every 3rd day?; denies voiding issues, nocturia diminished to 1-2...     DJD, Gout, LBP> stable on Allopurinol, Osteobiflex, Tylenol...    Patient's Medications  New Prescriptions   No medications on file  Previous Medications   ALLOPURINOL (ZYLOPRIM) 300  MG TABLET    Take 1 tablet (300 mg total) by mouth daily.   ASPIRIN (ADULT ASPIRIN EC LOW STRENGTH) 81 MG EC TABLET    Take 81 mg by mouth daily.     CHOLECALCIFEROL (VITAMIN D-3 PO)    Take by mouth daily.     FINASTERIDE (PROSCAR) 5 MG TABLET    Take 5 mg by mouth daily.   FOLIC ACID (FOLVITE) 400 MCG TABLET    Take 400 mcg by mouth 2 (two) times daily.     LOSARTAN (COZAAR) 50 MG TABLET    Take 1 tablet (50 mg total) by mouth daily.   MESALAMINE (LIALDA) 1.2 G EC TABLET    Take one tablet by mouth every other day    METHYLCELLULOSE (CITRUCEL) ORAL POWDER    Take by mouth daily.     MISC NATURAL PRODUCTS (OSTEO BI-FLEX ADV JOINT SHIELD) TABS    Take 1 tablet by mouth 2 (two) times daily.     MULTIPLE VITAMINS-MINERALS (CENTRUM SILVER PO)    Take 1 tablet by mouth daily.     NITROGLYCERIN (NITROSTAT) 0.4 MG SL TABLET    Place 0.4 mg under the tongue as  directed.     SIMVASTATIN (ZOCOR) 40 MG TABLET    Take 1 tablet (40 mg total) by mouth at bedtime.  Modified Medications   No medications on file  Discontinued Medications   FINASTERIDE (PROPECIA) 1 MG TABLET    Take 1 mg by mouth every 3 (three) days.

## 2012-02-15 NOTE — Patient Instructions (Signed)
Today we updated your med list in our EPIC system...    Continue your current medications the same...  Today we did your follow up FASTING blood work...    We will call you w/the results when avail...  Stay as active as poss & keep your weight down (great job w/ both!)...  Call for any problems....  Let's continue our 6 month follow up visits for at least the next 20 years!!!

## 2012-03-09 ENCOUNTER — Telehealth: Payer: Self-pay | Admitting: Critical Care Medicine

## 2012-03-09 MED ORDER — ALLOPURINOL 300 MG PO TABS: 300.0000 mg | ORAL_TABLET | Freq: Every day | ORAL | Status: DC

## 2012-03-09 NOTE — Telephone Encounter (Signed)
Pt needs refill on allopurinol

## 2012-03-23 ENCOUNTER — Telehealth: Payer: Self-pay | Admitting: Pulmonary Disease

## 2012-03-23 NOTE — Telephone Encounter (Signed)
Spoke with pt's daughter and she is asking for the on call doc to round on the pt who is at [facility] with his spouse who has PNA. She states that he is coughing and SOB and feels like may have PNA. I advised that he should go to UC or ED at [facility] for eval. She states will try to encourage the pt to do this. Nothing further needed.

## 2012-03-25 ENCOUNTER — Encounter (HOSPITAL_COMMUNITY): Payer: Self-pay | Admitting: *Deleted

## 2012-03-25 ENCOUNTER — Emergency Department (HOSPITAL_COMMUNITY)
Admission: EM | Admit: 2012-03-25 | Discharge: 2012-03-25 | Disposition: A | Discharge status: Home / Self Care | Payer: Medicare Other | Attending: Emergency Medicine | Admitting: Emergency Medicine

## 2012-03-25 ENCOUNTER — Emergency Department (HOSPITAL_COMMUNITY): Payer: Medicare Other

## 2012-03-25 DIAGNOSIS — J029 Acute pharyngitis, unspecified: Secondary | ICD-10-CM | POA: Insufficient documentation

## 2012-03-25 DIAGNOSIS — I739 Peripheral vascular disease, unspecified: Secondary | ICD-10-CM | POA: Diagnosis not present

## 2012-03-25 DIAGNOSIS — R509 Fever, unspecified: Secondary | ICD-10-CM | POA: Diagnosis not present

## 2012-03-25 DIAGNOSIS — I10 Essential (primary) hypertension: Secondary | ICD-10-CM | POA: Insufficient documentation

## 2012-03-25 DIAGNOSIS — R059 Cough, unspecified: Secondary | ICD-10-CM | POA: Insufficient documentation

## 2012-03-25 DIAGNOSIS — I2510 Atherosclerotic heart disease of native coronary artery without angina pectoris: Secondary | ICD-10-CM | POA: Diagnosis not present

## 2012-03-25 DIAGNOSIS — R05 Cough: Secondary | ICD-10-CM | POA: Diagnosis not present

## 2012-03-25 DIAGNOSIS — Z951 Presence of aortocoronary bypass graft: Secondary | ICD-10-CM | POA: Diagnosis not present

## 2012-03-25 DIAGNOSIS — R079 Chest pain, unspecified: Secondary | ICD-10-CM | POA: Diagnosis not present

## 2012-03-25 LAB — RAPID STREP SCREEN (MED CTR MEBANE ONLY): Streptococcus, Group A Screen (Direct): NEGATIVE

## 2012-03-25 MED ORDER — LIDOCAINE VISCOUS 2 % MT SOLN
20.0000 mL | Freq: Once | OROMUCOSAL | Status: AC
  Administered 2012-03-25: 20 mL via OROMUCOSAL
  Filled 2012-03-25: qty 20

## 2012-03-25 MED ORDER — LIDOCAINE VISCOUS 2 % MT SOLN: 20.0000 mL | OROMUCOSAL | Status: AC | PRN

## 2012-03-25 NOTE — ED Notes (Signed)
Discharge instructions reviewed with patient no questions at this time  

## 2012-03-25 NOTE — ED Provider Notes (Signed)
History     CSN: [id number]  Arrival date & time 03/25/12  4098   First MD Initiated Contact with Patient 03/25/12 0734     7:53 AM HPI Daniel Reeves is a 78 y.o. male is complaining of sore throat and cough. Reports sore throat has gradually worsened in the last 4 days. Reports slight elevation in temperatures. Tmax 99.4. Reports cough is nonproductive. Shortness breath or chest pain. Reports a significant history of same hospital with his wife who is admitted for pneumonia the last 7 days.  Patient is a 78 y.o. male presenting with pharyngitis. The history is provided by the patient.  Sore Throat This is a new problem. The current episode started in the past 7 days. The problem occurs constantly. The problem has been gradually worsening. Associated symptoms include coughing and a sore throat. Pertinent negatives include no abdominal pain, chest pain, chills, congestion, fatigue, fever, headaches, myalgias, nausea, neck pain, rash, swollen glands, vomiting or weakness. The symptoms are aggravated by swallowing. He has tried nothing for the symptoms.    Past Medical History  Diagnosis Date  . Cerumen impaction     Bilateral  . Hypertension   . CAD (coronary artery disease)   . Peripheral vascular disease   . Abdominal aortic aneurysm   . Hypercholesterolemia   . GERD (gastroesophageal reflux disease)   . Diverticulosis of colon   . Right flank pain   . Amebic dysentery   . Benign prostatic hypertrophy   . Degenerative joint disease   . Gout   . Lumbar back pain   . Anxiety     Past Surgical History  Procedure Date  . Coronary artery bypass graft 1979    x4  . Coronary artery bypass graft 1993    Redo x5 by Dr Gerrit Friends  . Inguinal hernia repair 1994    Right by Dr Gerrit Friends  . Inguinal hernia repair 1996    Left by Dr. Gerrit Friends  . Decompressive laminectomy 01/2006    L2 - scarum by Dr. Simonne Come    Family History  Problem Relation Age of Onset  . Parkinsonism Brother      History  Substance Use Topics  . Smoking status: Former Smoker    Quit date: 11/28/1944  . Smokeless tobacco: Never Used  . Alcohol Use: 7.0 oz/week    14 drink(s) per week      Review of Systems  Constitutional: Negative for fever, chills and fatigue.  HENT: Positive for sore throat and trouble swallowing. Negative for ear pain, congestion, rhinorrhea, sneezing, neck pain, postnasal drip and sinus pressure.   Respiratory: Positive for cough. Negative for shortness of breath.   Cardiovascular: Negative for chest pain.  Gastrointestinal: Negative for nausea, vomiting and abdominal pain.  Musculoskeletal: Negative for myalgias.  Skin: Negative for rash.  Neurological: Negative for weakness and headaches.  All other systems reviewed and are negative.    Allergies  Lisinopril; Methocarbamol; Pregabalin; and Ramipril  Home Medications   Current Outpatient Rx  Name Route Sig Dispense Refill  . ALLOPURINOL 300 MG PO TABS Oral Take 1 tablet (300 mg total) by mouth daily. 30 tablet 0  . ASPIRIN 81 MG PO TBEC Oral Take 81 mg by mouth daily.      [name] VITAMIN D-3 PO Oral Take by mouth daily.      [name] FINASTERIDE 5 MG PO TABS Oral Take 5 mg by mouth daily.    [name] FOLIC ACID 400 MCG PO TABS Oral Take  400 mcg by mouth 2 (two) times daily.      [name] LOSARTAN POTASSIUM 50 MG PO TABS Oral Take 1 tablet (50 mg total) by mouth daily. 90 tablet 3  . MESALAMINE 1.2 G PO TBEC  Take one tablet by mouth every other day     . METHYLCELLULOSE (LAXATIVE) PO POWD Oral Take by mouth daily.      . OSTEO BI-FLEX ADV JOINT SHIELD PO TABS Oral Take 1 tablet by mouth 2 (two) times daily.      . CENTRUM SILVER PO Oral Take 1 tablet by mouth daily.      [name] NITROGLYCERIN 0.4 MG SL SUBL Sublingual Place 0.4 mg under the tongue as directed.      [name] SIMVASTATIN 40 MG PO TABS Oral Take 1 tablet (40 mg total) by mouth at bedtime. 90 tablet 3    BP 124/74  Pulse 76  Temp(Src) 98 F (36.7 C) (Oral)  Resp 18  Wt 176  lb (79.833 kg)  SpO2 97%  Physical Exam  Constitutional: He is oriented to person, place, and time. He appears well-developed and well-nourished.  HENT:  Head: Normocephalic and atraumatic. No trismus in the jaw.  Right Ear: Hearing, tympanic membrane, external ear and ear canal normal.  Left Ear: Hearing, tympanic membrane, external ear and ear canal normal.  Nose: Nose normal.  Mouth/Throat: Mucous membranes are normal. No oral lesions. Posterior oropharyngeal erythema present. No oropharyngeal exudate, posterior oropharyngeal edema or tonsillar abscesses.  Eyes: Conjunctivae are normal. Pupils are equal, round, and reactive to light.  Neck: Normal range of motion. Neck supple.  Cardiovascular: Normal rate, regular rhythm and normal heart sounds.  Exam reveals no gallop and no friction rub.   No murmur heard. Pulmonary/Chest: Effort normal and breath sounds normal. He has no wheezes. He has no rales. He exhibits no tenderness.  Abdominal: Soft. Bowel sounds are normal.  Neurological: He is alert and oriented to person, place, and time.  Skin: Skin is warm and dry. No rash noted. No erythema. No pallor.  Psychiatric: He has a normal mood and affect. His behavior is normal.    ED Course  Procedures  8:40 AM Results for orders placed during the hospital encounter of 03/25/12  RAPID STREP SCREEN      Component Value Range   Streptococcus, Group A Screen (Direct) NEGATIVE  NEGATIVE    Dg Chest 2 View  03/25/2012  *RADIOLOGY REPORT*  Clinical Data: Cough and fever.  Chest pain  CHEST - 2 VIEW  Comparison: 02/03/2006  Findings: Previous median sternotomy and CABG procedure.  The heart size and mediastinal contours are within normal limits.  Both lungs are clear.  The visualized skeletal structures are unremarkable.  IMPRESSION:  No acute cardiopulmonary abnormalities.  Original Report Authenticated By: Rosealee Albee, M.D.     MDM  8:41 AM  Strep and CXR normal. Likely patient has a  viral infection with treat sore throat with viscous lidocaine. Advised re-evaluation with pcp and return here if he begins to develop fever, CP,SOB. Patient voices understanding and is ready for d/c      Thomasene Lot, PA-C 03/25/12 1610

## 2012-03-25 NOTE — Discharge Instructions (Signed)
Antibiotic Nonuse  Your caregiver felt that the infection or problem was not one that would be helped with an antibiotic. Infections may be caused by viruses or bacteria. Only a caregiver can tell which one of these is the likely cause of an illness. A cold is the most common cause of infection in both adults and children. A cold is a virus. Antibiotic treatment will have no effect on a viral infection. Viruses can lead to many lost days of work caring for sick children and many missed days of school. Children may catch as many as 10 "colds" or "flus" per year during which they can be tearful, cranky, and uncomfortable. The goal of treating a virus is aimed at keeping the ill person comfortable. Antibiotics are medications used to help the body fight bacterial infections. There are relatively few types of bacteria that cause infections but there are hundreds of viruses. While both viruses and bacteria cause infection they are very different types of germs. A viral infection will typically go away by itself within 7 to 10 days. Bacterial infections may spread or get worse without antibiotic treatment. Examples of bacterial infections are:  Sore throats (like strep throat or tonsillitis).   Infection in the lung (pneumonia).   Ear and skin infections.  Examples of viral infections are:  Colds or flus.   Most coughs and bronchitis.   Sore throats not caused by Strep.   Runny noses.  It is often best not to take an antibiotic when a viral infection is the cause of the problem. Antibiotics can kill off the helpful bacteria that we have inside our body and allow harmful bacteria to start growing. Antibiotics can cause side effects such as allergies, nausea, and diarrhea without helping to improve the symptoms of the viral infection. Additionally, repeated uses of antibiotics can cause bacteria inside of our body to become resistant. That resistance can be passed onto harmful bacterial. The next time  you have an infection it may be harder to treat if antibiotics are used when they are not needed. Not treating with antibiotics allows our own immune system to develop and take care of infections more efficiently. Also, antibiotics will work better for [place] when they are prescribed for bacterial infections. Treatments for a child that is ill may include:  Give extra fluids throughout the day to stay hydrated.   Get plenty of rest.   Only give your child over-the-counter or prescription medicines for pain, discomfort, or fever as directed by your caregiver.   The use of a cool mist humidifier may help stuffy noses.   Cold medications if suggested by your caregiver.  Your caregiver may decide to start you on an antibiotic if:  The problem you were seen for today continues for a longer length of time than expected.   You develop a secondary bacterial infection.  SEEK MEDICAL CARE IF:  Fever lasts longer than 5 days.   Symptoms continue to get worse after 5 to 7 days or become severe.   Difficulty in breathing develops.   Signs of dehydration develop (poor drinking, rare urinating, dark colored urine).   Changes in behavior or worsening tiredness (listlessness or lethargy).  Document Released: 01/23/2002 Document Revised: 11/03/2011 Document Reviewed: 07/22/2009 [facility] Patient Information 2012 [place].Cough, Adult  A cough is a reflex that helps clear your throat and airways. It can help heal the body or may be a reaction to an irritated airway. A cough may only last 2 or 3  weeks (acute) or may last more than 8 weeks (chronic).  CAUSES Acute cough:  Viral or bacterial infections.  Chronic cough:  Infections.   Allergies.   Asthma.   Post-nasal drip.   Smoking.   Heartburn or acid reflux.   Some medicines.   Chronic lung problems (COPD).   Cancer.  SYMPTOMS   Cough.   Fever.   Chest pain.   Increased breathing rate.   High-pitched whistling sound  when breathing (wheezing).   Colored mucus that you cough up (sputum).  TREATMENT   A bacterial cough may be treated with antibiotic medicine.   A viral cough must run its course and will not respond to antibiotics.   Your caregiver may recommend other treatments if you have a chronic cough.  HOME CARE INSTRUCTIONS   Only take over-the-counter or prescription medicines for pain, discomfort, or fever as directed by your caregiver. Use cough suppressants only as directed by your caregiver.   Use a cold steam vaporizer or humidifier in your bedroom or home to help loosen secretions.   Sleep in a semi-upright position if your cough is worse at night.   Rest as needed.   Stop smoking if you smoke.  SEEK IMMEDIATE MEDICAL CARE IF:   You have pus in your sputum.   Your cough starts to worsen.   You cannot control your cough with suppressants and are losing sleep.   You begin coughing up blood.   You have difficulty breathing.   You develop pain which is getting worse or is uncontrolled with medicine.   You have a fever.  MAKE SURE YOU:   Understand these instructions.   Will watch your condition.   Will get help right away if you are not doing well or get worse.  Document Released: 05/13/2011 Document Revised: 11/03/2011 Document Reviewed: 05/13/2011 [facility] Patient Information 2012 [place], [place].  Sore Throat Sore throats may be caused by bacteria and viruses. They may also be caused by:  Smoking.   Pollution.   Allergies.  If a sore throat is due to strep infection (a bacterial infection), you may need:  A throat swab.   A culture test to verify the strep infection.  You will need one of these:  An antibiotic shot.   Oral medicine for a full 10 days.  Strep infection is very contagious. A doctor should check any close contacts who have a sore throat or fever. A sore throat caused by a virus infection will usually last only 3-4 days. Antibiotics will not  treat a viral sore throat.  Infectious mononucleosis (a viral disease), however, can cause a sore throat that lasts for up to 3 weeks. Mononucleosis can be diagnosed with blood tests. You must have been sick for at least 1 week in order for the test to give accurate results. HOME CARE INSTRUCTIONS   To treat a sore throat, take mild pain medicine.   Increase your fluids.   Eat a soft diet.   Do not smoke.   Gargling with warm water or salt water (1 tsp. salt in 8 oz. water) can be helpful.   Try throat sprays or lozenges or sucking on hard candy to ease the symptoms.  Call your doctor if your sore throat lasts longer than 1 week.  SEEK IMMEDIATE MEDICAL CARE IF:  You have difficulty breathing.   You have increased swelling in the throat.   You have pain so severe that you are unable to swallow fluids or your  saliva.   You have a severe headache, a high fever, vomiting, or a red rash.  Document Released: 12/22/2004 Document Revised: 11/03/2011 Document Reviewed: 11/01/2007 [facility] Patient Information 2012 [place], [place].Sore Throat Sore throats may be caused by bacteria and viruses. They may also be caused by:  Smoking.   Pollution.   Allergies.  If a sore throat is due to strep infection (a bacterial infection), you may need:  A throat swab.   A culture test to verify the strep infection.  You will need one of these:  An antibiotic shot.   Oral medicine for a full 10 days.  Strep infection is very contagious. A doctor should check any close contacts who have a sore throat or fever. A sore throat caused by a virus infection will usually last only 3-4 days. Antibiotics will not treat a viral sore throat.  Infectious mononucleosis (a viral disease), however, can cause a sore throat that lasts for up to 3 weeks. Mononucleosis can be diagnosed with blood tests. You must have been sick for at least 1 week in order for the test to give accurate results. HOME CARE INSTRUCTIONS    To treat a sore throat, take mild pain medicine.   Increase your fluids.   Eat a soft diet.   Do not smoke.   Gargling with warm water or salt water (1 tsp. salt in 8 oz. water) can be helpful.   Try throat sprays or lozenges or sucking on hard candy to ease the symptoms.  Call your doctor if your sore throat lasts longer than 1 week.  SEEK IMMEDIATE MEDICAL CARE IF:  You have difficulty breathing.   You have increased swelling in the throat.   You have pain so severe that you are unable to swallow fluids or your saliva.   You have a severe headache, a high fever, vomiting, or a red rash.  Document Released: 12/22/2004 Document Revised: 11/03/2011 Document Reviewed: 11/01/2007 [facility] Patient Information 2012 [place], [place].

## 2012-03-25 NOTE — ED Notes (Signed)
Pt from home with reports of sore throat, low grade fever and cough, pt also endorses that wife currently in hospital with viral pneumonia.

## 2012-03-27 ENCOUNTER — Emergency Department (HOSPITAL_COMMUNITY)
Admission: EM | Admit: 2012-03-27 | Discharge: 2012-03-27 | Disposition: A | Discharge status: Home / Self Care | Payer: Medicare Other | Attending: Emergency Medicine | Admitting: Emergency Medicine

## 2012-03-27 ENCOUNTER — Encounter (HOSPITAL_COMMUNITY): Payer: Self-pay | Admitting: Emergency Medicine

## 2012-03-27 ENCOUNTER — Telehealth: Payer: Self-pay | Admitting: Pulmonary Disease

## 2012-03-27 DIAGNOSIS — I2510 Atherosclerotic heart disease of native coronary artery without angina pectoris: Secondary | ICD-10-CM | POA: Diagnosis not present

## 2012-03-27 DIAGNOSIS — Z79899 Other long term (current) drug therapy: Secondary | ICD-10-CM | POA: Diagnosis not present

## 2012-03-27 DIAGNOSIS — E7800 Pure hypercholesterolemia, unspecified: Secondary | ICD-10-CM | POA: Insufficient documentation

## 2012-03-27 DIAGNOSIS — Z7982 Long term (current) use of aspirin: Secondary | ICD-10-CM | POA: Insufficient documentation

## 2012-03-27 DIAGNOSIS — H748X2 Other specified disorders of left middle ear and mastoid: Secondary | ICD-10-CM

## 2012-03-27 DIAGNOSIS — H748X9 Other specified disorders of middle ear and mastoid, unspecified ear: Secondary | ICD-10-CM | POA: Insufficient documentation

## 2012-03-27 DIAGNOSIS — IMO0002 Reserved for concepts with insufficient information to code with codable children: Secondary | ICD-10-CM | POA: Diagnosis not present

## 2012-03-27 DIAGNOSIS — M109 Gout, unspecified: Secondary | ICD-10-CM | POA: Diagnosis not present

## 2012-03-27 DIAGNOSIS — K219 Gastro-esophageal reflux disease without esophagitis: Secondary | ICD-10-CM | POA: Diagnosis not present

## 2012-03-27 DIAGNOSIS — Z87891 Personal history of nicotine dependence: Secondary | ICD-10-CM | POA: Diagnosis not present

## 2012-03-27 DIAGNOSIS — Z951 Presence of aortocoronary bypass graft: Secondary | ICD-10-CM | POA: Insufficient documentation

## 2012-03-27 DIAGNOSIS — M1990 Unspecified osteoarthritis, unspecified site: Secondary | ICD-10-CM | POA: Insufficient documentation

## 2012-03-27 DIAGNOSIS — H9209 Otalgia, unspecified ear: Secondary | ICD-10-CM | POA: Diagnosis not present

## 2012-03-27 DIAGNOSIS — I10 Essential (primary) hypertension: Secondary | ICD-10-CM | POA: Diagnosis not present

## 2012-03-27 DIAGNOSIS — N400 Enlarged prostate without lower urinary tract symptoms: Secondary | ICD-10-CM | POA: Insufficient documentation

## 2012-03-27 MED ORDER — AMOXICILLIN 500 MG PO CAPS: 500.0000 mg | ORAL_CAPSULE | Freq: Three times a day (TID) | ORAL | Status: DC

## 2012-03-27 MED ORDER — AMOXICILLIN 500 MG PO CAPS: 500.0000 mg | ORAL_CAPSULE | Freq: Three times a day (TID) | ORAL | Status: AC

## 2012-03-27 NOTE — Telephone Encounter (Signed)
I spoke with pt and he states he had to go to the ED on 03/25/12 for sore throat, sneezing and coughing. Pt had a nose swab and it was neg and cxr was negative for PND. Pt then had to go back to the ED on 03/27/12 for left ear ache and he had large amounts of blood coming out of that ear. Pt was given amoxicillin TID and referred to Dr. Suszanne Conners. Pt is going to call tomorrow to make an APT for this. SN is already aware of this and will sign off message.

## 2012-03-27 NOTE — ED Notes (Signed)
Pt states his left ear was hurting but then it started bleeding and the pain went down  Pt has blood on a tissue that came from his ear

## 2012-03-27 NOTE — ED Provider Notes (Signed)
Medical screening examination/treatment/procedure(s) were performed by non-physician practitioner and as supervising physician I was immediately available for consultation/collaboration.  Raeford Razor, MD 03/27/12 2302

## 2012-03-27 NOTE — ED Provider Notes (Signed)
History     CSN: [id number]  Arrival date & time 03/27/12  0254   First MD Initiated Contact with Patient 03/27/12 [phone]      Chief Complaint  Patient presents with  . Otalgia    (Consider location/radiation/quality/duration/timing/severity/associated sxs/prior treatment) The history is provided by the patient.   the patient has had upper respiratory symptoms for approximately one week.  He is currently in the hospital staying with his wife who was hospitalized.  He was awoken at 2:30 with pain in his left ear.  Surely after that he noticed a fluid coming from his left ear and found it to be blood.  He has had no recent head trauma.  He takes an aspirin everyday.  He is not on other anticoagulants.  He denies sticking anything in his ears.  He still has some coughing congestion sore throat.  He still has some redness of his right eye per the patient she reports she is being treated for.  He's had no other bleeding at other sites.  His symptoms are mild.  His pain now is resolved and his left ear  Past Medical History  Diagnosis Date  . Cerumen impaction     Bilateral  . Hypertension   . CAD (coronary artery disease)   . Peripheral vascular disease   . Abdominal aortic aneurysm   . Hypercholesterolemia   . GERD (gastroesophageal reflux disease)   . Diverticulosis of colon   . Right flank pain   . Amebic dysentery   . Benign prostatic hypertrophy   . Degenerative joint disease   . Gout   . Lumbar back pain   . Anxiety     Past Surgical History  Procedure Date  . Coronary artery bypass graft 1979    x4  . Coronary artery bypass graft 1993    Redo x5 by Dr Gerrit Friends  . Inguinal hernia repair 1994    Right by Dr Gerrit Friends  . Inguinal hernia repair 1996    Left by Dr. Gerrit Friends  . Decompressive laminectomy 01/2006    L2 - scarum by Dr. Simonne Come    Family History  Problem Relation Age of Onset  . Parkinsonism Brother     History  Substance Use Topics  . Smoking status:  Former Smoker    Quit date: 11/28/1944  . Smokeless tobacco: Never Used  . Alcohol Use: 7.0 oz/week    14 drink(s) per week      Review of Systems  HENT: Positive for ear pain.   All other systems reviewed and are negative.    Allergies  Lisinopril; Methocarbamol; Pregabalin; and Ramipril  Home Medications   Current Outpatient Rx  Name Route Sig Dispense Refill  . ALLOPURINOL 300 MG PO TABS Oral Take 1 tablet (300 mg total) by mouth daily. 30 tablet 0  . ASPIRIN 81 MG PO TBEC Oral Take 81 mg by mouth every morning.     [name] VITAMIN D-3 PO Oral Take by mouth daily.      [name] FINASTERIDE 5 MG PO TABS Oral Take 5 mg by mouth every 3 (three) days.     [name] FOLIC ACID 400 MCG PO TABS Oral Take 400 mcg by mouth 2 (two) times daily.      [name] LIDOCAINE VISCOUS 2 % MT SOLN Oral Take 20 mLs by mouth as needed for pain. 100 mL 0  . LOSARTAN POTASSIUM 50 MG PO TABS Oral Take 1 tablet (50 mg total) by mouth daily. 90 tablet 3  .  MESALAMINE 1.2 G PO TBEC Oral Take 1,200 mg by mouth every other day. He takes on even days of the month.    . METHYLCELLULOSE (LAXATIVE) PO POWD Oral Take 1 packet by mouth daily.     . METHYLCELLULOSE (LAXATIVE) 500 MG PO TABS Oral Take 2 tablets by mouth daily.    . OSTEO BI-FLEX ADV JOINT SHIELD PO TABS Oral Take 1 tablet by mouth 2 (two) times daily.      . ADULT MULTIVITAMIN W/MINERALS CH Oral Take 1 tablet by mouth every morning. He takes Surveyor, quantity.    [name] REFRESH OP Both Eyes Place 1 drop into both eyes daily as needed. For dry eyes.    [name] SIMVASTATIN 40 MG PO TABS Oral Take 40 mg by mouth at bedtime.    . AMOXICILLIN 500 MG PO CAPS Oral Take 1 capsule (500 mg total) by mouth 3 (three) times daily. 21 capsule 0  . NITROGLYCERIN 0.4 MG SL SUBL Sublingual Place 0.4 mg under the tongue every 5 (five) minutes as needed. For chest pain.      BP 149/76  Pulse 83  Temp(Src) 98.5 F (36.9 C) (Oral)  Resp 20  SpO2 95%  Physical Exam  Nursing note and vitals  reviewed. Constitutional: He is oriented to person, place, and time. He appears well-developed and well-nourished.  HENT:  Head: Normocephalic and atraumatic.       Right TM and external ear normal.  Left axonal is normal.  There is provided in the left external auditory canal.  A small amount of wax which was removed.  There does appear to be blood behind the left eardrum and a small perforation with blood coming around this.  There is no obvious pus or foul-smelling discharge  Eyes: EOM are normal.  Neck: Normal range of motion.  Cardiovascular: Normal rate, regular rhythm, normal heart sounds and intact distal pulses.   Pulmonary/Chest: Effort normal and breath sounds normal. No respiratory distress.  Abdominal: Soft. He exhibits no distension. There is no tenderness.  Musculoskeletal: Normal range of motion.  Neurological: He is alert and oriented to person, place, and time.  Skin: Skin is warm and dry.  Psychiatric: He has a normal mood and affect. Judgment normal.    ED Course  Procedures (including critical care time)  Labs Reviewed - No data to display Dg Chest 2 View  03/25/2012  *RADIOLOGY REPORT*  Clinical Data: Cough and fever.  Chest pain  CHEST - 2 VIEW  Comparison: 02/03/2006  Findings: Previous median sternotomy and CABG procedure.  The heart size and mediastinal contours are within normal limits.  Both lungs are clear.  The visualized skeletal structures are unremarkable.  IMPRESSION:  No acute cardiopulmonary abnormalities.  Original Report Authenticated By: Rosealee Albee, M.D.     1. Hemotympanum, left       MDM  Degrees URI symptoms for 1 week and now with hemotympanum.  Place the patient on antibiotics.  The patient will need ENT followup.  There is no active bleeding at this time.  His ear pain is much improved likely now that that is perforated        Lyanne Co, MD 03/27/12 [id number]

## 2012-03-27 NOTE — Telephone Encounter (Signed)
On Sunday, 03/26/12, pt was seen in the ED for a "closed up throat".  Pt stated he was unable to swallow his pills.  On Tuesday, 03/27/12, pt was seen because he had developed an earache & began bleeding copious amounts of blood from his ear.  ED docs recommended pt be seen by ENT Darletta Moll located on [address]  Pt would like to know what SN's recs are.  Does he want to see pt?  Does he believe the ENT doc recommended by ED is a good choice, or is there someone else SN would refer the pt to?  Antionette Fairy

## 2012-03-27 NOTE — ED Notes (Signed)
Pt was seen here recently for sore throat and had some tests done  Pt has been gargling with salt water and using lozenges since then but continues to c/o sore throat  Pt states he continues to cough

## 2012-03-30 DIAGNOSIS — H6120 Impacted cerumen, unspecified ear: Secondary | ICD-10-CM | POA: Diagnosis not present

## 2012-03-30 DIAGNOSIS — H66019 Acute suppurative otitis media with spontaneous rupture of ear drum, unspecified ear: Secondary | ICD-10-CM | POA: Diagnosis not present

## 2012-04-05 ENCOUNTER — Other Ambulatory Visit: Payer: Self-pay | Admitting: Critical Care Medicine

## 2012-04-09 DIAGNOSIS — H903 Sensorineural hearing loss, bilateral: Secondary | ICD-10-CM | POA: Diagnosis not present

## 2012-04-09 DIAGNOSIS — J342 Deviated nasal septum: Secondary | ICD-10-CM | POA: Diagnosis not present

## 2012-04-09 DIAGNOSIS — H66019 Acute suppurative otitis media with spontaneous rupture of ear drum, unspecified ear: Secondary | ICD-10-CM | POA: Diagnosis not present

## 2012-04-12 ENCOUNTER — Other Ambulatory Visit: Payer: Self-pay | Admitting: *Deleted

## 2012-04-12 MED ORDER — LOSARTAN POTASSIUM 50 MG PO TABS: 50.0000 mg | ORAL_TABLET | Freq: Every day | ORAL | Status: DC

## 2012-04-18 ENCOUNTER — Other Ambulatory Visit: Payer: Self-pay | Admitting: Pulmonary Disease

## 2012-04-18 MED ORDER — SIMVASTATIN 40 MG PO TABS: 40.0000 mg | ORAL_TABLET | Freq: Every day | ORAL | Status: DC

## 2012-05-23 ENCOUNTER — Other Ambulatory Visit: Payer: Self-pay | Admitting: Pulmonary Disease

## 2012-05-23 ENCOUNTER — Encounter: Payer: Self-pay | Admitting: Cardiology

## 2012-05-23 ENCOUNTER — Ambulatory Visit (INDEPENDENT_AMBULATORY_CARE_PROVIDER_SITE_OTHER): Payer: Medicare Other | Admitting: Cardiology

## 2012-05-23 VITALS — BP 118/64 | HR 58 | Ht 72.0 in | Wt 174.0 lb

## 2012-05-23 DIAGNOSIS — I2510 Atherosclerotic heart disease of native coronary artery without angina pectoris: Secondary | ICD-10-CM

## 2012-05-23 DIAGNOSIS — E7800 Pure hypercholesterolemia, unspecified: Secondary | ICD-10-CM

## 2012-05-23 DIAGNOSIS — I714 Abdominal aortic aneurysm, without rupture, unspecified: Secondary | ICD-10-CM

## 2012-05-23 LAB — LIPID PANEL
HDL: 42.7 mg/dL (ref >39.00)
LDL Cholesterol: 49 mg/dL (ref 0–99)
Total CHOL/HDL Ratio: 2
Triglycerides: 57 mg/dL (ref 0.0–149.0)

## 2012-05-23 LAB — HEPATIC FUNCTION PANEL: Albumin: 4 g/dL (ref 3.5–5.2)

## 2012-05-23 MED ORDER — SIMVASTATIN 40 MG PO TABS: 40.0000 mg | ORAL_TABLET | Freq: Every day | ORAL | Status: DC

## 2012-05-23 NOTE — Assessment & Plan Note (Signed)
Last checked in January.  Only 3 by 3.1    Seen SN note.

## 2012-05-23 NOTE — Assessment & Plan Note (Signed)
Recheck lipid and liver.   

## 2012-05-23 NOTE — Telephone Encounter (Signed)
[facility] mail order requesting Simvastatin 40 mg <> take 1 tablet daily #90

## 2012-05-23 NOTE — Progress Notes (Signed)
HPI:  The patient is doing really pretty well. His he says that his short-term memory may not be all that good, but functionally he gets along quite well. His wife which is dismissed from the hospital after prolonged stay, and he stayed for nearly 9 nights in a row in order to provide care for her.  Denies any chest pain at the present time.  No new symptoms.    Current Outpatient Prescriptions  Medication Sig Dispense Refill  . allopurinol (ZYLOPRIM) 300 MG tablet TAKE ONE TABLET BY MOUTH ONE TIME DAILY  30 tablet  11  . aspirin (ADULT ASPIRIN EC LOW STRENGTH) 81 MG EC tablet Take 81 mg by mouth every morning.       . Cholecalciferol (VITAMIN D-3 PO) Take by mouth daily.        . finasteride (PROSCAR) 5 MG tablet Take 5 mg by mouth every 3 (three) days.       . folic acid (FOLVITE) 400 MCG tablet Take 400 mcg by mouth 2 (two) times daily.        [name] losartan (COZAAR) 50 MG tablet Take 1 tablet (50 mg total) by mouth daily.  90 tablet  3  . mesalamine (LIALDA) 1.2 G EC tablet Take 1,200 mg by mouth every other day. He takes on even days of the month.      . methylcellulose (CITRUCEL) oral powder Take 1 packet by mouth daily.       . Methylcellulose, Laxative, (CITRUCEL) 500 MG TABS Take 2 tablets by mouth daily.      . Misc Natural Products (OSTEO BI-FLEX ADV JOINT SHIELD) TABS Take 1 tablet by mouth 2 (two) times daily.        . Multiple Vitamin (MULITIVITAMIN WITH MINERALS) TABS Take 1 tablet by mouth every morning. He takes Surveyor, quantity.      . nitroGLYCERIN (NITROSTAT) 0.4 MG SL tablet Place 0.4 mg under the tongue every 5 (five) minutes as needed. For chest pain.      . Polyvinyl Alcohol-Povidone (REFRESH OP) Place 1 drop into both eyes daily as needed. For dry eyes.      . simvastatin (ZOCOR) 40 MG tablet Take 1 tablet (40 mg total) by mouth at bedtime.  90 tablet  0    Allergies  Allergen Reactions  . Lisinopril     REACTION: dizziness  . Methocarbamol     REACTION: pt states  "dizzy"  . Pregabalin     REACTION: pt states "dizzy"  . Ramipril     REACTION: hives and dizziness    Past Medical History  Diagnosis Date  . Cerumen impaction     Bilateral  . Hypertension   . CAD (coronary artery disease)   . Peripheral vascular disease   . Abdominal aortic aneurysm   . Hypercholesterolemia   . GERD (gastroesophageal reflux disease)   . Diverticulosis of colon   . Right flank pain   . Amebic dysentery   . Benign prostatic hypertrophy   . Degenerative joint disease   . Gout   . Lumbar back pain   . Anxiety     Past Surgical History  Procedure Date  . Coronary artery bypass graft 1979    x4  . Coronary artery bypass graft 1993    Redo x5 by Dr Gerrit Friends  . Inguinal hernia repair 1994    Right by Dr Gerrit Friends  . Inguinal hernia repair 1996    Left by Dr. Gerrit Friends  . Decompressive laminectomy  01/2006    L2 - scarum by Dr. Simonne Come    Family History  Problem Relation Age of Onset  . Parkinsonism Brother     History   Social History  . Marital Status: Married    Spouse Name: Magda Paganini x 64 yrs    Number of Children: N/A  . Years of Education: N/A   Occupational History  . 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 Smoking status: Former Smoker    Quit date: 11/28/1944  . Smokeless tobacco: Never Used  . Alcohol Use: 7.0 oz/week    14 drink(s) per week  . Drug Use: Not on file  . Sexually Active: Not on file   Other Topics Concern  . Not on file   Social History Narrative   Married7 children    ROS: Please see the HPI.  All other systems reviewed and negative.  PHYSICAL EXAM:  BP 118/64  Pulse 58  Ht 6' (1.829 m)  Wt 174 lb (78.926 kg)  BMI 23.60 kg/m2  General: Well developed, well nourished, in no acute distress. Head:  Normocephalic and atraumatic. Neck: no JVD Lungs: Clear to auscultation and percussion. Heart: Normal S1 and S2.  No murmur, rubs or gallops.  Abdomen:  Normal bowel sounds;  soft; non tender; no organomegaly Pulses: Pulses normal in all 4 extremities. Extremities: No clubbing or cyanosis. No edema. Neurologic: Alert and oriented x 3.  EKG:  NSR.  LBBB  ASSESSMENT AND PLAN:

## 2012-05-23 NOTE — Patient Instructions (Addendum)
Your physician recommends that you have lab work today: LIPID and LIVER  Your physician wants you to follow-up in: [facility] 2014. You will receive a reminder letter in the mail two months in advance. If you don't receive a letter, please call our office to schedule the follow-up appointment.  Your physician recommends that you continue on your current medications as directed. Please refer to the Current Medication list given to you today.

## 2012-05-23 NOTE — Assessment & Plan Note (Signed)
Two prior CABGs at present. Doing well overall. No angina.  Does what he wants.  Continue to monitor.

## 2012-05-28 DIAGNOSIS — H903 Sensorineural hearing loss, bilateral: Secondary | ICD-10-CM | POA: Diagnosis not present

## 2012-05-28 DIAGNOSIS — J343 Hypertrophy of nasal turbinates: Secondary | ICD-10-CM | POA: Diagnosis not present

## 2012-05-28 DIAGNOSIS — H6980 Other specified disorders of Eustachian tube, unspecified ear: Secondary | ICD-10-CM | POA: Diagnosis not present

## 2012-05-28 DIAGNOSIS — J342 Deviated nasal septum: Secondary | ICD-10-CM | POA: Diagnosis not present

## 2012-05-28 DIAGNOSIS — J310 Chronic rhinitis: Secondary | ICD-10-CM | POA: Diagnosis not present

## 2012-06-06 ENCOUNTER — Telehealth: Payer: Self-pay | Admitting: Cardiology

## 2012-06-06 NOTE — Telephone Encounter (Signed)
New msg Pt wants to know does he need to do anything prior to having teeth extracted. Please call

## 2012-06-06 NOTE — Telephone Encounter (Signed)
I spoke with the pt and he wanted to know if he needs to hold his ASA prior to his dental extractions (2 teeth) scheduled on 06/18/12.  I made the pt aware that the oral surgeon who is performing his procedure should have given him instructions about holding ASA if needed.  The pt will call oral surgeons office to confirm is he does need to hold ASA.  The pt will call me back to get approval from Dr Riley Kill to hold ASA if needed.

## 2012-06-07 NOTE — Telephone Encounter (Signed)
I spoke with the pt's wife and she said the oral surgeon would like the pt to hold ASA the day prior to extraction and then resume after extraction.

## 2012-06-08 NOTE — Telephone Encounter (Signed)
I made Dr Riley Kill aware of this information and he said this would be okay.  Pt's wife aware.

## 2012-07-26 DIAGNOSIS — H35379 Puckering of macula, unspecified eye: Secondary | ICD-10-CM | POA: Diagnosis not present

## 2012-08-23 ENCOUNTER — Ambulatory Visit: Payer: Medicare Other | Admitting: Pulmonary Disease

## 2012-09-03 ENCOUNTER — Ambulatory Visit: Payer: Medicare Other | Admitting: Pulmonary Disease

## 2012-09-03 DIAGNOSIS — N401 Enlarged prostate with lower urinary tract symptoms: Secondary | ICD-10-CM | POA: Diagnosis not present

## 2012-09-05 ENCOUNTER — Encounter: Payer: Self-pay | Admitting: *Deleted

## 2012-09-06 ENCOUNTER — Ambulatory Visit (INDEPENDENT_AMBULATORY_CARE_PROVIDER_SITE_OTHER): Payer: Medicare Other | Admitting: Pulmonary Disease

## 2012-09-06 ENCOUNTER — Encounter: Payer: Self-pay | Admitting: Pulmonary Disease

## 2012-09-06 VITALS — BP 130/60 | HR 53 | Temp 96.9°F | Ht 72.0 in | Wt 172.4 lb

## 2012-09-06 DIAGNOSIS — I739 Peripheral vascular disease, unspecified: Secondary | ICD-10-CM

## 2012-09-06 DIAGNOSIS — K219 Gastro-esophageal reflux disease without esophagitis: Secondary | ICD-10-CM

## 2012-09-06 DIAGNOSIS — K5730 Diverticulosis of large intestine without perforation or abscess without bleeding: Secondary | ICD-10-CM

## 2012-09-06 DIAGNOSIS — Z87898 Personal history of other specified conditions: Secondary | ICD-10-CM

## 2012-09-06 DIAGNOSIS — M1990 Unspecified osteoarthritis, unspecified site: Secondary | ICD-10-CM

## 2012-09-06 DIAGNOSIS — I2510 Atherosclerotic heart disease of native coronary artery without angina pectoris: Secondary | ICD-10-CM

## 2012-09-06 DIAGNOSIS — M109 Gout, unspecified: Secondary | ICD-10-CM

## 2012-09-06 DIAGNOSIS — F411 Generalized anxiety disorder: Secondary | ICD-10-CM

## 2012-09-06 DIAGNOSIS — E7800 Pure hypercholesterolemia, unspecified: Secondary | ICD-10-CM | POA: Diagnosis not present

## 2012-09-06 DIAGNOSIS — I10 Essential (primary) hypertension: Secondary | ICD-10-CM | POA: Diagnosis not present

## 2012-09-06 NOTE — Patient Instructions (Addendum)
Today we updated your med list in our EPIC system...    Continue your current medications the same...  Everything looks good & you had recent follow up visits w/ [name] & [name]...  Call for any problems...  Have a great time on your cruise...  Let's plan a follow up visit in about 6 months w/ FASTING blood work at that time.Marland KitchenMarland Kitchen

## 2012-09-06 NOTE — Progress Notes (Signed)
Subjective:    Patient ID: Daniel Reeves, male    DOB: 04/30/1924, 78 y.o.   MRN: [id number]  HPI 78 y/o WM here for a follow up visit... he has multiple medical problems including HBP;  CAD followed by [name];  ASPVD w/ 3cm AAA that has been stable;  Hyperchol;  GERD;  Divertics/ Polyps/ ?fecal incontinence;  Hx amebic dysentery yrs ago;  BPH/ BOO/ elev PSA on Avodart & followed by [name];  DJD/ Gout/ hx LBP w/ surg 2007 by [name]...  ~  August 12, 2010:  he saw [name] for GI 4/11- ch in bowel habits, constip & some fecal incontinence; rec incr fiber + colonoscopy- done 4/11w/ mod divertics, otherw neg...  he saw [name] for Cards f/u 6/1- stable, doing well, no changes made...  he saw [name] for GU f/u 7/11- BPH/ BOO/ elev PSA- improved w/ Avodart Q3d, PSA decr to 4, voiding better, side effects improved w/ decr dose...  today several somatic complaints> left elbow pain, tired all the time, occas skips that he sees in his tortuous brachial art but does not sense (rec to decr caffeine)... BP controlled, no angina, remains active, he is not fasting today & wants to wait 69mo before rechecking labs, refuses flu shot.  ~  February 08, 2011:  69mo ROV & c/o being tired, always tired, up at night at least 3 times & this may account for some of the fatigue;  we decided to recheck his full lab work & everything is WNL (x PSA=4.29 followed by [name], & Vit D 37- rec OTC 1000u/d.    He saw [name] 1/12> f/u BPH, BOO, elev PSA on Avodart since 10/10 (w/ PSA decr from 11 to 4);  stable on rx & he will continue Q71mo f/u (also c/o ED- given Levitra trial)...    He saw [name] for GI 1/12>  mod severe left colon divertics, c/o loose stools w/ bl & mucous, given trial Lialda rx + Citrucel daily (less blood seen still some leakage & mucous)...  ~  August 17, 2011:  69mo ROV & he reports doing satis, no new complaints or concerns...    Cardiac> HBP, CAD> followed by [name] on ASA, Losartan; s/p  CABG 79 & 93; BP= 132/68 today, seen 6/12 & note reviewed- doing satis w/o CP, palpit, dizzy, ch in SOB, edema, etc...    ASPVS> known 3cm AAA being followed; he had CTAbd done via ER 4/12= +right kid stone w/ hydroneph; AAA measured 3.2cm, infrarenal, & 3.4cm in length.    CHOL> on Simva40 & FLP 6/12 looked good x sl low HDL (rec incr exercise)...    GI> Gerd, Divertics, Polyps, Hx amebic dysentery> followed byDrJacobs on Lialda- pt decr to 1Qod now & stable BMs...    BPH> followed by [name] who switched off Avodart, on Proscar now but only taking every 3rd day?; denies voiding issues, nocturia diminished to 1-2...    DJD, Gout, LBP> stable on Allopurinol, Osteobiflex, Tylenol...  ~  February 15, 2012:  69mo ROV & [name]'s CC is fatigue, tired all the time, no energy- yet he looks fine, exam is neg & labs unrevealing; asked to gradually incr his exercise program to build his stamina, consider Silver Sneakers, etc...     Cardiac> HBP, CAD> followed by [name] on ASA, Losartan; s/p CABG 79 & 93; BP= 130/70 today; seen 6/12 & note reviewed- doing satis w/o CP, syncope, ch in SOB, edema, etc; he notes occas palpit & will be due  for a yearly check up by [name] soon...    ASPVS> known 3cm AAA being followed; he had CTAbd done via ER 4/12= +right kid stone w/ hydroneph; AAA measured 3.2cm, infrarenal, & 3.4cm in length; f/u AbdSonar 1/13 w/ AAA measuring 3cm, no change (f/u rec 94yr).    CHOL> on Simva40 & FLP showed TChol 97, TG 72, HDL 40, LDL 43    GI> Gerd, Divertics, Polyps, Hx amebic dysentery> followed byDrJacobs on Lialda- pt decr to 1Qod now & stable BMs...    BPH> followed by [name] (he does PSA) & switched off Avodart, on Proscar now but only taking every 3rd day?; denies voiding issues, nocturia diminished to 1-2...    DJD, Gout, LBP> stable on Allopurinol, Osteobiflex, Tylenol... LABS 3/13:  FLP- at goals on Simva40;  Chems- wnl;  CBC- wnl;  TSH=2.67;  BNP=247 CXR 4/13 showed prev median  sternotomy & CABG, norm heart size, clear lungs, NAD...  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    [name] did FLP 6/13 on Simva40 w/ TChol 103, TG 57, HDL 43, LDL 49    He continues on Lialda 1.2gm Qod for his diarrhea & well regulated on this...    He saw [name] for Urology f/u> doing satis on Proscar5, DRE was neg, no PSA done due to age... We reviewed prob list, meds, xrays and labs> see below for updates >> he declines the Flu vaccine... EKG 6/13 by [name] showed SBrady, rate58, LAD, LBBB, NAD...          Problem List:  HYPERTENSION (ICD-401.9) - on LOSARTAN 50mg /d at present... BP today= 130/60, he monitors BP at home... denies HA, visual changes, CP, palipit, dizziness, syncope, edema, etc... ~  CXR 6/10 showed stable heart size s/p CABG, clear lungs, aortic calcif seen, NAD.[name]. ~  CXR 4/13 showed prev median sternotomy & CABG, norm heart size, clear lungs, NAD...  CAD (ICD-414.00) - he takes ASA 81mg /d... followed by [name] w/ hx of 2 prev CABG's in 1979 & 1993...  ~  NuclearStressTest 3/07 showed infer infarct & mild peri-infarct ischemia, abn wall motion & EF=45%... essentially no change from 2003 ~  check up by [name] Q76mo- [facility], doing satis, no changes made, f/u Abd Ao doppler in Dec.  PERIPHERAL VASCULAR DISEASE (ICD-443.9) - he takes the Aspirin 81mg /d... known ~3cm AAA being followed serially... ~  ABI's 3/06 were normal  ~  CT Abd 12/08 w/ 3.3cm focal saccular AAA in distal abd ao. ((it was 3cm in 05)... ~  AbdAo Doppler 11/08 w/ stable infra-renal fusiform aneurysm ~ 3cm... ~   AbdAo Doppler 12/10 showed stable ~3cm AAA, mod irreg plaque, norm iliacs... f/u 6yr. ~  AbdAo Doppler 12/11 showed stable ~3cm AAA, mod mid-distal Ao stenosis, f/u 66yr. ~  CTAbd 4/12 via ER (right kid stone) showed AAA measured 3.2cm, infrarenal, & 3.4cm in length. ~  AbdAo Doppler 1/13 showed stable 3cm AAA, no changes, f/u rec in 1 yr...  HYPERCHOLESTEROLEMIA (ICD-272.0) - on SIMVASTATIN 40mg /d... ~  FLP 7/08 showed TChol 99, TG 103, HDL 32, LDL  46 ~  FLP 8/09 showed TChol 101, TG 73, HDL 28, LDL 58 ~  FLP 8/10 showed TChol 106, TG 50, HDL 36, LDL 60 ~  FLP 3/12 showed TChol 107, TG 100, HDL 30, LDL 57 ~  FLP 3/13 on Simva40 showed TChol 97, TG 72, HDL 40, LDL 43 ~  FLP 6/13 by [name] showed TChol 103, TG 57, HDL 43, LDL 49  GERD (ICD-530.81) - uses OTC meds as needed... last EGD 10/07 by DrSam showed sm HH otherw neg...  ~ 8/09: noted some dysphagia for large pills, offered GI referral but he declined.  DIVERTICULOSIS OF COLON (ICD-562.10) & COLONIC POLYPS (ICD-211.3) ~  colonoscopy 10/07 by DrSam showed divertics, and 7mm polyp= polypoid mucosa on path... f/u 41yrs planned... ~  2/10: noted some IBS symptoms and fecal incontinence, again offered GI referral but he declined. ~  3/11: now he is ready to discuss fecal incontinence w/ gastroenterology> saw [name] 4/11 & rec for icr fiber. ~  Colonoscopy 4/11 by [name] w/ mod divertics, otherw neg- rec Citrucel daily.  FLANK PAIN, RIGHT (ICD-789.09) - he was seen 12/08 c/o Rt flank pain of 6 months duration... he had seen [name] and was unhappy w/ his eval, concerned about kidney stone vs recurrence of amebic dysentery... 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 BoneScan= arthritic uptake all over but none in rt flank area, ribs, etc...  Rx w/ heating pad and ETODOLAC... he reports that his  discomfort pretty much resolved and he felt good about the thorough evaluation... ~  8/10:  prev right flank pain resolved- no recurrence. ~  4/12:  ER eval for right sided pain w/ CTAbd showing +right kid stone w/ hydroneph; AAA measured 3.2cm, infrarenal, & 3.4cm in length.  Hx of AMEBIC DYSENTERY (ICD-006.9)  BENIGN PROSTATIC HYPERTROPHY, HX OF (ICD-V13.8) - hx of increased PSA in the 6-8 range and followed by [name]... he has LTOS including the fact that it takes him 12 min to urinate and empty satisfactorily... he has nocturia x 2-3... he tells me that [name] didn't want to place him on meds, rather he rec a diet for his prostate health w/ fruits + vegetables, low fat, & no white foods like rice etc... his voiding symptoms are improved on this diet, he says... pt has refused cysto & bx of prostate, they are following his PSA's Q72mo. ~  2010: [name] started Avodart when PSA hit 11.7 & it improved to 4.3, but pt c/o fatigue, weak, breast tenderness- so they are decreasing the Avodart to Qod, now Q3rd day (w/ sl improvement in symptoms). ~  7/11:  f/u [name] w/ PSA= 4.09 & symptoms improved on the Avodart Q3d... ~  3/12:  PSA= 4.29 & [name] switched him from Avodart to Proscar 5mg /d... ~  Pt reports regular f/u w/ [name]> he reports DRE is neg, & they have stopped doing PSA due to age...  DEGENERATIVE JOINT DISEASE (ICD-715.90) - he takes OSTEOBIFLEX Bid, + MVI, etc... he manages his mod severe arthritis pain very well and only uses an occas Advil... prev had Etodolac, Tramadol, DCN100- but stopped all of these...  GOUT (ICD-274.9) - on ALLOPURINOL 300mg /d...  ~  labs 2/09 showed Uric = 3.8 ~  labs 8/10 showed Uric = 3.8  BACK PAIN, LUMBAR (ICD-724.2) - s/p decompressive laminectomy 3/07 by [name]...  ANXIETY (ICD-300.00)  Past Surgical History  Procedure Date  . Coronary artery bypass graft 1979    x4  . Coronary artery bypass graft 1993    Redo x5 by Dr Gerrit Friends  . Inguinal  hernia repair 1994    Right by Dr Gerrit Friends  . Inguinal hernia repair 1996    Left by Dr. Gerrit Friends  . Decompressive laminectomy 01/2006    L2 - scarum by Dr. Simonne Come    Outpatient Encounter Prescriptions as of 09/06/2012  Medication Sig Dispense Refill  . allopurinol (ZYLOPRIM) 300 MG tablet TAKE ONE TABLET BY MOUTH ONE TIME DAILY  30 tablet  11  . aspirin (ADULT ASPIRIN EC LOW STRENGTH) 81 MG EC tablet Take 81 mg by mouth every morning.       . beta carotene w/minerals (OCUVITE) tablet Take 1 tablet by mouth daily.      . Cholecalciferol (VITAMIN D-3 PO) Take by mouth daily.        . finasteride (PROSCAR) 5 MG tablet Take 5 mg by mouth every 3 (three) days.       . folic acid (FOLVITE) 400 MCG tablet Take 400 mcg by mouth 2 (two) times daily.        [name] losartan (COZAAR) 50 MG tablet Take 1 tablet (50 mg total) by mouth daily.  90 tablet  3  . mesalamine (LIALDA) 1.2 G EC tablet Take 1,200 mg by mouth every other day. He takes on even days of the month.      . methylcellulose (CITRUCEL) oral powder Take 1 packet by mouth daily.       . Methylcellulose, Laxative, (CITRUCEL) 500 MG TABS Take 2 tablets by mouth daily.      . Misc Natural Products (OSTEO BI-FLEX ADV JOINT SHIELD) TABS Take 1 tablet by mouth 2 (two) times daily.        . Multiple Vitamin (MULITIVITAMIN WITH MINERALS) TABS Take 1 tablet by mouth every morning. He takes Surveyor, quantity.      . nitroGLYCERIN (NITROSTAT) 0.4 MG SL tablet Place 0.4 mg under the tongue every 5 (five) minutes as needed. For chest pain.      . Polyvinyl Alcohol-Povidone (REFRESH OP) Place 1 drop into both eyes daily as needed. For dry eyes.      . simvastatin (ZOCOR) 40 MG tablet Take 1 tablet (40 mg total) by mouth at bedtime.  90 tablet  1    Allergies  Allergen Reactions  . Lisinopril     REACTION: dizziness  . Methocarbamol     REACTION: pt states "dizzy"  . Pregabalin     REACTION: pt states "dizzy"  . Ramipril     REACTION: hives and  dizziness    Current Medications, Allergies, Past Medical History, Past Surgical History, Family History, and Social History were reviewed in [employer] record.    Review of Systems        See HPI - all other systems neg except as noted...  The patient complains of incontinence and muscle weakness.  The patient denies anorexia, fever, weight loss, weight gain, vision loss, decreased hearing, hoarseness, chest pain, syncope, dyspnea on exertion, peripheral edema, prolonged cough, headaches, hemoptysis, abdominal pain, melena, hematochezia, severe indigestion/heartburn, hematuria, suspicious skin lesions, transient blindness, difficulty walking, depression, unusual weight change, abnormal bleeding, enlarged lymph nodes, and angioedema.     Objective:   Physical Exam      WD, WN, 78 y/o WM in NAD... GENERAL:  Alert & oriented; pleasant & cooperative.Marland KitchenMarland Kitchen  HEENT:  [place]/AT, EOM-wnl, PERRLA, EACs-clear, TMs-wnl, NOSE-clear, THROAT-clear & wnl. NECK:  Supple w/ fairROM; no JVD; normal carotid impulses w/o bruits; no thyromegaly or nodules palpated; no lymphadenopathy. CHEST:  Clear to P & A; without wheezes/ rales/ or rhonchi. HEART:  Regular Rhythm; gr 1/6 SEM, w/o rubs or gallops heard... ABDOMEN:  Soft & nontender; normal bowel sounds; no organomegaly or masses detected. EXT: without deformities, mod arthritic changes (esp hands); no varicose veins/ venous insuffic/ or edema. NEURO:  CN's intact; no focal neuro deficits... DERM:  No lesions noted; no rash etc...  RADIOLOGY DATA:  Reviewed in the EPIC EMR & discussed w/ the patient...  LABORATORY DATA:  Reviewed in the EPIC EMR & discussed w/ the patient...   Assessment & Plan:    Cardiac> HBP, CAD> followed by [name] on ASA, Losartan; s/p CABG 79 & 93; BP controlled, Cardiac stable, notes few palpit & followed Q72mo by Cards.     ASPVS> known 3cm AAA being followed; CTAbd done via ER 4/12= +right kid stone w/  hydroneph; AAA measured 3.2cm, infrarenal, & 3.4cm in length; Sonar 1/13 stable- no change in 3cm AAA (f/u planned 77yr)...     CHOL> on Simva40 & FLP at goals & seems to be tol med well...     GI> Gerd, Divertics, Polyps, Hx amebic dysentery> followed byDrJacobs on Lialda- pt decr to 1Qod now & stable BMs...     BPH> followed by [name] who switched off Avodart, on Proscar now but only taking every 3rd day?; denies voiding issues, nocturia diminished to 1-2...     DJD, Gout, LBP> stable on Allopurinol, Osteobiflex, Tylenol...    Patient's Medications  New Prescriptions   No medications on file  Previous Medications   ALLOPURINOL (ZYLOPRIM) 300 MG TABLET    TAKE ONE TABLET BY MOUTH ONE TIME DAILY   ASPIRIN (ADULT ASPIRIN EC LOW STRENGTH) 81 MG EC TABLET    Take 81 mg by mouth every morning.    BETA CAROTENE W/MINERALS (OCUVITE) TABLET    Take 1 tablet by mouth daily.   CHOLECALCIFEROL (VITAMIN D-3 PO)    Take by mouth daily.     FINASTERIDE (PROSCAR) 5 MG TABLET    Take 5 mg by mouth every 3 (three) days.    FOLIC ACID (FOLVITE) 400 MCG TABLET    Take 400 mcg by mouth 2 (two) times daily.     LOSARTAN (COZAAR) 50 MG TABLET    Take 1 tablet (50 mg total) by mouth daily.   MESALAMINE (LIALDA) 1.2 G EC TABLET    Take 1,200 mg by mouth every other day. He takes on even days of the month.   METHYLCELLULOSE (CITRUCEL) ORAL POWDER    Take 1 packet by mouth daily.    METHYLCELLULOSE, LAXATIVE, (CITRUCEL) 500 MG TABS    Take 2 tablets by mouth daily.   MISC NATURAL PRODUCTS (OSTEO BI-FLEX ADV JOINT SHIELD) TABS    Take 1 tablet by mouth 2 (two) times daily.     MULTIPLE VITAMIN (MULITIVITAMIN WITH MINERALS) TABS    Take 1 tablet by mouth every morning. He takes Surveyor, quantity.   NITROGLYCERIN (NITROSTAT) 0.4 MG SL TABLET    Place 0.4 mg under the tongue every 5 (five) minutes as needed. For chest pain.   POLYVINYL ALCOHOL-POVIDONE (REFRESH OP)    Place 1 drop into both eyes daily as needed. For dry  eyes.   SIMVASTATIN (ZOCOR) 40 MG TABLET    Take 1 tablet (40  mg total) by mouth at bedtime.  Modified Medications   No medications on file  Discontinued Medications   No medications on file

## 2012-09-25 ENCOUNTER — Other Ambulatory Visit: Payer: Self-pay | Admitting: Pulmonary Disease

## 2012-09-25 MED ORDER — ALLOPURINOL 300 MG PO TABS: 300.0000 mg | ORAL_TABLET | Freq: Every day | ORAL | Status: DC

## 2012-10-09 ENCOUNTER — Other Ambulatory Visit: Payer: Self-pay | Admitting: *Deleted

## 2012-10-09 MED ORDER — ALLOPURINOL 300 MG PO TABS: 300.0000 mg | ORAL_TABLET | Freq: Every day | ORAL | Status: DC

## 2012-12-04 ENCOUNTER — Encounter: Payer: Self-pay | Admitting: Cardiology

## 2012-12-04 ENCOUNTER — Ambulatory Visit (INDEPENDENT_AMBULATORY_CARE_PROVIDER_SITE_OTHER): Payer: Medicare Other | Admitting: Cardiology

## 2012-12-04 VITALS — BP 132/74 | HR 55 | Ht 72.0 in | Wt 177.0 lb

## 2012-12-04 DIAGNOSIS — E7800 Pure hypercholesterolemia, unspecified: Secondary | ICD-10-CM | POA: Diagnosis not present

## 2012-12-04 DIAGNOSIS — I714 Abdominal aortic aneurysm, without rupture: Secondary | ICD-10-CM

## 2012-12-04 DIAGNOSIS — I2510 Atherosclerotic heart disease of native coronary artery without angina pectoris: Secondary | ICD-10-CM

## 2012-12-04 DIAGNOSIS — I25810 Atherosclerosis of coronary artery bypass graft(s) without angina pectoris: Secondary | ICD-10-CM | POA: Diagnosis not present

## 2012-12-04 NOTE — Assessment & Plan Note (Signed)
No recurrent angina at present.  Overall doing pretty well.

## 2012-12-04 NOTE — Assessment & Plan Note (Signed)
This is small and ordered by Dr. Kriste Basque. Follow up is not yet scheduled.

## 2012-12-04 NOTE — Progress Notes (Signed)
HPI:  The patient returns in followup. He really is doing quite well. He's not had any specific complaints. He is wife expressed considerable appreciate should for our care today.  He has no new complaints.    Current Outpatient Prescriptions  Medication Sig Dispense Refill  . allopurinol (ZYLOPRIM) 300 MG tablet Take 1 tablet (300 mg total) by mouth daily.  90 tablet  3  . aspirin (ADULT ASPIRIN EC LOW STRENGTH) 81 MG EC tablet Take 81 mg by mouth every morning.       . beta carotene w/minerals (OCUVITE) tablet Take 1 tablet by mouth daily.      . Cholecalciferol (VITAMIN D-3 PO) Take by mouth daily.        . finasteride (PROSCAR) 5 MG tablet Take 5 mg by mouth every 3 (three) days.       . folic acid (FOLVITE) 400 MCG tablet Take 400 mcg by mouth 2 (two) times daily.        [name] losartan (COZAAR) 50 MG tablet Take 1 tablet (50 mg total) by mouth daily.  90 tablet  3  . mesalamine (LIALDA) 1.2 G EC tablet Take 1,200 mg by mouth every other day. He takes on even days of the month.      . methylcellulose (CITRUCEL) oral powder Take 1 table spoon daily      . Methylcellulose, Laxative, (CITRUCEL) 500 MG TABS Take 2 tablets when travelling      . Misc Natural Products (OSTEO BI-FLEX ADV JOINT SHIELD) TABS Take 1 tablet by mouth 2 (two) times daily.        . Multiple Vitamin (MULITIVITAMIN WITH MINERALS) TABS Take 1 tablet by mouth every morning. He takes Surveyor, quantity.      . nitroGLYCERIN (NITROSTAT) 0.4 MG SL tablet Place 0.4 mg under the tongue every 5 (five) minutes as needed. For chest pain.      . Polyvinyl Alcohol-Povidone (REFRESH OP) Place 1 drop into both eyes daily as needed. For dry eyes.      . simvastatin (ZOCOR) 40 MG tablet Take 1 tablet (40 mg total) by mouth at bedtime.  90 tablet  1    Allergies  Allergen Reactions  . Lisinopril     REACTION: dizziness  . Methocarbamol     REACTION: pt states "dizzy"  . Pregabalin     REACTION: pt states "dizzy"  . Ramipril    REACTION: hives and dizziness    Past Medical History  Diagnosis Date  . Cerumen impaction     Bilateral  . Hypertension   . CAD (coronary artery disease)   . Peripheral vascular disease   . Abdominal aortic aneurysm   . Hypercholesterolemia   . GERD (gastroesophageal reflux disease)   . Diverticulosis of colon   . Right flank pain   . Amebic dysentery   . Benign prostatic hypertrophy   . Degenerative joint disease   . Gout   . Lumbar back pain   . Anxiety     Past Surgical History  Procedure Date  . Coronary artery bypass graft 1979    x4  . Coronary artery bypass graft 1993    Redo x5 by Dr Gerrit Friends  . Inguinal hernia repair 1994    Right by Dr Gerrit Friends  . Inguinal hernia repair 1996    Left by Dr. Gerrit Friends  . Decompressive laminectomy 01/2006    L2 - scarum by Dr. Simonne Come    Family History  Problem Relation Age of Onset  .  Parkinsonism Brother     History   Social History  . Marital Status: Married    Spouse Name: Magda Paganini x 64 yrs    Number of Children: N/A  . Years of Education: N/A   Occupational History  . 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 Smoking status: Former Smoker    Quit date: 11/28/1944  . Smokeless tobacco: Never Used  . Alcohol Use: 7.0 oz/week    14 drink(s) per week  . Drug Use: Not on file  . Sexually Active: Not on file   Other Topics Concern  . Not on file   Social History Narrative   Married7 children    ROS: Please see the HPI.  All other systems reviewed and negative.  PHYSICAL EXAM:  BP 132/74  Pulse 55  Ht 6' (1.829 m)  Wt 177 lb (80.287 kg)  BMI 24.01 kg/m2  SpO2 99%  General: Thin, Well developed, well nourished, in no acute distress. Head:  Normocephalic and atraumatic. Neck: no JVD Lungs: Clear to auscultation and percussion. Heart: Normal S1 and S2.  No murmur, rubs or gallops.  Pulses: Pulses normal in all 4 extremities. Extremities: No clubbing or cyanosis. No  edema. Neurologic: Alert and oriented x 3.  EKG:  NSR.  LBBB. Left axis deviation.    ASSESSMENT AND PLAN:

## 2012-12-04 NOTE — Patient Instructions (Addendum)
Your physician recommends that you schedule a follow-up appointment as needed  

## 2012-12-04 NOTE — Assessment & Plan Note (Signed)
[facility].

## 2012-12-10 ENCOUNTER — Telehealth: Payer: Self-pay

## 2012-12-10 NOTE — Telephone Encounter (Signed)
Can you arrange a follow AAA [place] for him. It was due. Thanks. TS  I left the pt a message to contact our office.

## 2012-12-13 ENCOUNTER — Other Ambulatory Visit: Payer: Self-pay | Admitting: *Deleted

## 2012-12-13 DIAGNOSIS — I714 Abdominal aortic aneurysm, without rupture: Secondary | ICD-10-CM

## 2012-12-13 NOTE — Telephone Encounter (Signed)
F/u   Returning call back to nurse.  Aware that Leotis Shames is off today.

## 2012-12-13 NOTE — Telephone Encounter (Signed)
AAA U/S ordered, pt notified, and scheduling/pcc notified to contact pt to schedule procedure.

## 2012-12-27 ENCOUNTER — Encounter (INDEPENDENT_AMBULATORY_CARE_PROVIDER_SITE_OTHER): Payer: Medicare Other

## 2012-12-27 DIAGNOSIS — I714 Abdominal aortic aneurysm, without rupture: Secondary | ICD-10-CM | POA: Diagnosis not present

## 2012-12-31 ENCOUNTER — Encounter: Payer: Self-pay | Admitting: Cardiology

## 2012-12-31 NOTE — Telephone Encounter (Signed)
This encounter was created in error - please disregard.

## 2012-12-31 NOTE — Telephone Encounter (Signed)
New Problem:    Patient called returning your call.  Please call back. 

## 2013-01-16 DIAGNOSIS — H524 Presbyopia: Secondary | ICD-10-CM | POA: Diagnosis not present

## 2013-01-16 DIAGNOSIS — H35039 Hypertensive retinopathy, unspecified eye: Secondary | ICD-10-CM | POA: Diagnosis not present

## 2013-01-16 DIAGNOSIS — H35379 Puckering of macula, unspecified eye: Secondary | ICD-10-CM | POA: Diagnosis not present

## 2013-01-22 ENCOUNTER — Encounter: Payer: Self-pay | Admitting: Pulmonary Disease

## 2013-01-24 ENCOUNTER — Encounter: Payer: Self-pay | Admitting: Pulmonary Disease

## 2013-01-24 NOTE — Telephone Encounter (Signed)
Marliss Czar will you please review this information with Dr. Kriste Basque and let the pt know. Thanks. Carron Curie, CMA

## 2013-02-04 ENCOUNTER — Telehealth: Payer: Self-pay | Admitting: Pulmonary Disease

## 2013-02-04 MED ORDER — NITROGLYCERIN 0.4 MG SL SUBL: 0.4000 mg | SUBLINGUAL_TABLET | SUBLINGUAL | Status: DC | PRN

## 2013-02-04 NOTE — Telephone Encounter (Signed)
I don't see where we have refilled the nitro. Does this need to come from his cardiologists? Please advise SN thanks

## 2013-02-04 NOTE — Telephone Encounter (Signed)
Called and spoke with pt and he is aware that the rx for the nitro has been sent [facility] to [facility] with refills.  Pt voiced his understanding and nothing further is needed.

## 2013-02-20 ENCOUNTER — Encounter: Payer: Self-pay | Admitting: Pulmonary Disease

## 2013-02-22 ENCOUNTER — Encounter: Payer: Self-pay | Admitting: Cardiology

## 2013-03-07 ENCOUNTER — Ambulatory Visit: Payer: Medicare Other | Admitting: Pulmonary Disease

## 2013-03-19 ENCOUNTER — Other Ambulatory Visit (INDEPENDENT_AMBULATORY_CARE_PROVIDER_SITE_OTHER): Payer: Medicare Other

## 2013-03-19 ENCOUNTER — Encounter: Payer: Self-pay | Admitting: Pulmonary Disease

## 2013-03-19 ENCOUNTER — Ambulatory Visit (INDEPENDENT_AMBULATORY_CARE_PROVIDER_SITE_OTHER): Payer: Medicare Other | Admitting: Pulmonary Disease

## 2013-03-19 VITALS — BP 122/64 | HR 56 | Temp 98.0°F | Ht 72.0 in | Wt 179.0 lb

## 2013-03-19 DIAGNOSIS — I739 Peripheral vascular disease, unspecified: Secondary | ICD-10-CM

## 2013-03-19 DIAGNOSIS — E7800 Pure hypercholesterolemia, unspecified: Secondary | ICD-10-CM | POA: Diagnosis not present

## 2013-03-19 DIAGNOSIS — I2510 Atherosclerotic heart disease of native coronary artery without angina pectoris: Secondary | ICD-10-CM | POA: Diagnosis not present

## 2013-03-19 DIAGNOSIS — I714 Abdominal aortic aneurysm, without rupture: Secondary | ICD-10-CM

## 2013-03-19 DIAGNOSIS — K219 Gastro-esophageal reflux disease without esophagitis: Secondary | ICD-10-CM

## 2013-03-19 DIAGNOSIS — K5730 Diverticulosis of large intestine without perforation or abscess without bleeding: Secondary | ICD-10-CM | POA: Diagnosis not present

## 2013-03-19 DIAGNOSIS — R079 Chest pain, unspecified: Secondary | ICD-10-CM

## 2013-03-19 DIAGNOSIS — Z87898 Personal history of other specified conditions: Secondary | ICD-10-CM

## 2013-03-19 DIAGNOSIS — E559 Vitamin D deficiency, unspecified: Secondary | ICD-10-CM

## 2013-03-19 DIAGNOSIS — M109 Gout, unspecified: Secondary | ICD-10-CM

## 2013-03-19 DIAGNOSIS — I10 Essential (primary) hypertension: Secondary | ICD-10-CM

## 2013-03-19 DIAGNOSIS — F411 Generalized anxiety disorder: Secondary | ICD-10-CM | POA: Diagnosis not present

## 2013-03-19 DIAGNOSIS — N200 Calculus of kidney: Secondary | ICD-10-CM

## 2013-03-19 DIAGNOSIS — M545 Low back pain: Secondary | ICD-10-CM

## 2013-03-19 DIAGNOSIS — M1990 Unspecified osteoarthritis, unspecified site: Secondary | ICD-10-CM

## 2013-03-19 LAB — BASIC METABOLIC PANEL
BUN: 23 mg/dL (ref 6–23)
Calcium: 9.3 mg/dL (ref 8.4–10.5)
Creatinine, Ser: 1.1 mg/dL (ref 0.4–1.5)
GFR: 71.02 mL/min (ref >60.00)
Glucose, Bld: 90 mg/dL (ref 70–99)
Potassium: 4.9 mEq/L (ref 3.5–5.1)

## 2013-03-19 LAB — CBC WITH DIFFERENTIAL/PLATELET
Eosinophils Relative: 2.4 % (ref 0.0–5.0)
HCT: 41.9 % (ref 39.0–52.0)
Lymphs Abs: 1.7 10*3/uL (ref 0.7–4.0)
Monocytes Relative: 9 % (ref 3.0–12.0)
Platelets: 209 10*3/uL (ref 150.0–400.0)
WBC: 7.6 10*3/uL (ref 4.5–10.5)

## 2013-03-19 LAB — LIPID PANEL
Cholesterol: 112 mg/dL (ref 0–200)
HDL: 37.4 mg/dL — ABNORMAL LOW (ref >39.00)
Triglycerides: 66 mg/dL (ref 0.0–149.0)
VLDL: 13.2 mg/dL (ref 0.0–40.0)

## 2013-03-19 LAB — TSH: TSH: 2.34 u[IU]/mL (ref 0.35–5.50)

## 2013-03-19 LAB — HEPATIC FUNCTION PANEL
AST: 30 U/L (ref 0–37)
Albumin: 4.1 g/dL (ref 3.5–5.2)

## 2013-03-19 NOTE — Patient Instructions (Addendum)
Today we updated your med list in our EPIC system...    Continue your current medications the same...  Today we did your follow up FASTING blood work...    We will contact you w/ the results when available...   Continue your diet & exercise program...  We will arrange for a Cardiac follow up w/ DrCrenshaw to discuss the recent episode of CP & consider follow up stress testing...  Call for any questions...  Let's plan a follow up visit in 42mo, sooner if needed for problems.Marland KitchenMarland Kitchen

## 2013-03-19 NOTE — Progress Notes (Signed)
Subjective:    Patient ID: TEDDRICK MALLARI, male    DOB: 1924/07/18, 79 y.o.   MRN: [id number]  HPI 79 y/o WM here for a follow up visit... he has multiple medical problems including HBP;  CAD followed by [name];  ASPVD w/ 3cm AAA that has been stable;  Hyperchol;  GERD;  Divertics/ Polyps/ ?fecal incontinence;  Hx amebic dysentery yrs ago;  BPH/ BOO/ elev PSA on Avodart & followed by [name];  DJD/ Gout/ hx LBP w/ surg 2007 by [name]...  ~  August 17, 2011:  43mo ROV & he reports doing satis, no new complaints or concerns...    Cardiac> HBP, CAD> followed by [name] on ASA, Losartan; s/p CABG 79 & 93; BP= 132/68 today, seen 6/12 & note reviewed- doing satis w/o CP, palpit, dizzy, ch in SOB, edema, etc...    ASPVS> known 3cm AAA being followed; he had CTAbd done via ER 4/12= +right kid stone w/ hydroneph; AAA measured 3.2cm, infrarenal, & 3.4cm in length.    CHOL> on Simva40 & FLP 6/12 looked good x sl low HDL (rec incr exercise)...    GI> Gerd, Divertics, Polyps, Hx amebic dysentery> followed byDrJacobs on Lialda- pt decr to 1Qod now & stable BMs...    BPH> followed by [name] who switched off Avodart, on Proscar now but only taking every 3rd day?; denies voiding issues, nocturia diminished to 1-2...    DJD, Gout, LBP> stable on Allopurinol, Osteobiflex, Tylenol...  ~  February 15, 2012:  43mo ROV & Jensen's CC is fatigue, tired all the time, no energy- yet he looks fine, exam is neg & labs unrevealing; asked to gradually incr his exercise program to build his stamina, consider Silver Sneakers, etc...     Cardiac> HBP, CAD> followed by [name] on ASA, Losartan; s/p CABG 79 & 93; BP= 130/70 today; seen 6/12 & note reviewed- doing satis w/o CP, syncope, ch in SOB, edema, etc; he notes occas palpit & will be due for a yearly check up by [name] soon...    ASPVS> known 3cm AAA being followed; he had CTAbd done via ER 4/12= +right kid stone w/ hydroneph; AAA measured 3.2cm, infrarenal, &  3.4cm in length; f/u AbdSonar 1/13 w/ AAA measuring 3cm, no change (f/u rec 62yr).    CHOL> on Simva40 & FLP showed TChol 97, TG 72, HDL 40, LDL 43    GI> Gerd, Divertics, Polyps, Hx amebic dysentery> followed byDrJacobs on Lialda- pt decr to 1Qod now & stable BMs...    BPH> followed by [name] (he does PSA) & switched off Avodart, on Proscar now but only taking every 3rd day?; denies voiding issues, nocturia diminished to 1-2...    DJD, Gout, LBP> stable on Allopurinol, Osteobiflex, Tylenol... LABS 3/13:  FLP- at goals on Simva40;  Chems- wnl;  CBC- wnl;  TSH=2.67;  BNP=247 CXR 4/13 showed prev median sternotomy & CABG, norm heart size, clear lungs, NAD...  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    [name] did FLP 6/13 on Simva40 w/ TChol 103, TG 57, HDL 43, LDL 49  He continues on Lialda 1.2gm Qod for his diarrhea & well regulated on this...    He saw [name] for Urology f/u> doing satis on Proscar5, DRE was neg, no PSA done due to age... We reviewed prob list, meds, xrays and labs> see below for updates >> he declines the Flu vaccine... EKG 6/13 by [name] showed SBrady, rate58, LAD, LBBB, NAD.[name].   ~  March 19, 2013:  94mo ROV & Serjio states he is doing well, casualy mentioned some intermittent CP "tightness" & last episode on plane returning from a trip; as noted he's had 2 prev CABG surgeries & we will set him up to see Aletta Edouard now that [name] has retired... We reviewed the following medical problems during today's office visit >>     Cardiac> HBP, CAD> followed by [name]/Crenshaw on ASA81, Losartan50; s/p CABG  79 & 93; BP= 122/64 today & notes intermittent chest tightness & SOB, no palpit or edema; will will refer to Cards- ?cath to check grafts...    ASPVD> known 3cm AAA being followed; he had CTAbd done via ER 4/12= +right kid stone w/ hydroneph; AAA measured 3.2cm, infrarenal, & 3.4cm in length; f/u AbdSonar 1/14 w/ infrarenal saccular AAA measuring 3.4cm, no change (f/u rec 19yr).    CHOL> on Simva40 & FLP 4/14 showed TChol 112, TG 66, HDL 37, LDL 61    GI> Gerd, Divertics, Polyps, Hx amebic dysentery> followed byDrJacobs on Lialda- pt decr to 1Qod now & stable BMs...    BPH> followed by [name] (he does PSA) & switched off Avodart, on Proscar5 now but only taking every 3rd day?; denies voiding issues, nocturia diminished to 1-2...    DJD, Gout, LBP> stable on Allopurinol300, Osteobiflex, Tylenol... We reviewed prob list, meds, xrays and labs> see below for updates >>  LABS 4/14:  FLP- at goals on Simva40;  Chems- wnl;  CBC- wnl;  TSH=2.34;  VitD=45...           Problem List:  HYPERTENSION (ICD-401.9) - on LOSARTAN 50mg /d at present... BP today= 130/60, he monitors BP at home... denies HA, visual changes, CP, palipit, dizziness, syncope, edema, etc... ~  CXR 6/10 showed stable heart size s/p CABG, clear lungs, aortic calcif seen, NAD.[name]. ~  CXR 4/13 showed prev median sternotomy & CABG, norm heart size, clear lungs, NAD... ~  4/14: followed by [name]/Crenshaw on ASA81, Losartan50; s/p CABG 79 & 93; BP= 122/64 today & notes intermittent chest tightness & SOB, no palpit or edema; will will refer to Cards- ?cath to check grafts.  CAD (ICD-414.00) - he takes ASA 81mg /d... followed by [name] w/ hx of 2 prev CABG's in 1979 & 1993...  ~  NuclearStressTest 3/07 showed infer infarct & mild peri-infarct ischemia, abn wall motion & EF=45%... essentially no change from 2003 ~  check up by [name] Q79mo- [facility], doing satis, no changes made, f/u Abd Ao doppler in Dec. ~  4/14: followed by  [name]/Crenshaw on ASA81, Losartan50; s/p CABG 79 & 93; BP= 122/64 today & notes intermittent chest tightness & SOB, no palpit or edema; will will refer to Cards- ?cath to check grafts.  PERIPHERAL VASCULAR DISEASE (ICD-443.9) - he takes the Aspirin 81mg /d... known ~3cm AAA being followed serially... ~  ABI's 3/06 were normal  ~  CT Abd 12/08 w/ 3.3cm focal saccular AAA in distal abd ao. ((it was 3cm in 05)... ~  AbdAo Doppler 11/08 w/ stable infra-renal fusiform aneurysm ~ 3cm... ~  AbdAo Doppler 12/10 showed stable ~  3cm AAA, mod irreg plaque, norm iliacs... f/u 35yr. ~  AbdAo Doppler 12/11 showed stable ~3cm AAA, mod mid-distal Ao stenosis, f/u 80yr. ~  CTAbd 4/12 via ER (right kid stone) showed AAA measured 3.2cm, infrarenal, & 3.4cm in length. ~  AbdAo Doppler 1/13 showed stable 3cm AAA, no changes, f/u rec in 1 yr... ~  AbdAo Doppler 1/14 showed stable infrarenal saccular 3.4cm AAA, no real change, f/u 31yr...  HYPERCHOLESTEROLEMIA (ICD-272.0) - on SIMVASTATIN 40mg /d... ~  FLP 7/08 showed TChol 99, TG 103, HDL 32, LDL 46 ~  FLP 8/09 showed TChol 101, TG 73, HDL 28, LDL 58 ~  FLP 8/10 showed TChol 106, TG 50, HDL 36, LDL 60 ~  FLP 3/12 showed TChol 107, TG 100, HDL 30, LDL 57 ~  FLP 3/13 on Simva40 showed TChol 97, TG 72, HDL 40, LDL 43 ~  FLP 6/13 by [name] showed TChol 103, TG 57, HDL 43, LDL 49 ~  FLP 4/14 on Simva40 showed TChol 112, TG 66, HDL 37, LDL 61   GERD (ICD-530.81) - uses OTC meds as needed... last EGD 10/07 by DrSam showed sm HH otherw neg...  ~ 8/09: noted some dysphagia for large pills, offered GI referral but he declined.  DIVERTICULOSIS OF COLON (ICD-562.10) & COLONIC POLYPS (ICD-211.3) ~  colonoscopy 10/07 by DrSam showed divertics, and 7mm polyp= polypoid mucosa on path... f/u 17yrs planned... ~  2/10: noted some IBS symptoms and fecal incontinence, again offered GI referral but he declined. ~  3/11: now he is ready to discuss fecal incontinence w/  gastroenterology> saw [name] 4/11 & rec for icr fiber. ~  Colonoscopy 4/11 by [name] w/ mod divertics, otherw neg- rec Citrucel daily. ~  4/14:  followed byDrJacobs on Lialda- pt decr to 1Qod now & stable BMs.   FLANK PAIN, RIGHT (ICD-789.09) - he was seen 12/08 c/o Rt flank pain of 6 months duration... he had seen [name] and was unhappy w/ his eval, concerned about kidney stone vs recurrence of amebic dysentery... 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 BoneScan= arthritic uptake all over but none in rt flank area, ribs, etc...  Rx w/ heating pad and ETODOLAC... he reports that his discomfort pretty much resolved and he felt good about the thorough evaluation... ~  8/10:  prev right flank pain resolved- no recurrence. ~  4/12:  ER eval for right sided pain w/ CTAbd showing +right kid stone w/ hydroneph; AAA measured 3.2cm, infrarenal, & 3.4cm in length.  Hx of AMEBIC DYSENTERY (ICD-006.9)  BENIGN PROSTATIC HYPERTROPHY, HX OF (ICD-V13.8) - hx of increased PSA in the 6-8 range and followed by [name]... he has LTOS including the fact that it takes him 12 min to urinate and empty satisfactorily... he has nocturia x 2-3... he tells me that [name] didn't want to place him on meds, rather he rec a diet for his prostate health w/ fruits + vegetables, low fat, & no white foods like rice etc... his voiding symptoms are improved on this diet, he says... pt has refused cysto & bx of prostate, they are following his PSA's Q35mo. ~  2010: [name] started Avodart when PSA hit 11.7 & it improved to 4.3, but pt c/o fatigue, weak, breast tenderness- so they are decreasing the Avodart to Qod, now Q3rd day (w/ sl improvement in symptoms). ~  7/11:  f/u  [name] w/ PSA= 4.09 & symptoms improved on the Avodart Q3d... ~  3/12:  PSA= 4.29 & [name] switched him from Avodart  to Proscar 5mg /d... ~  Pt reports regular f/u w/ [name]> he reports DRE is neg, & they have stopped doing PSA due to age...  DEGENERATIVE JOINT DISEASE (ICD-715.90) - he takes OSTEOBIFLEX Bid, + MVI, etc... he manages his mod severe arthritis pain very well and only uses an occas Advil... prev had Etodolac, Tramadol, DCN100- but stopped all of these...  GOUT (ICD-274.9) - on ALLOPURINOL 300mg /d...  ~  labs 2/09 showed Uric = 3.8 ~  labs 8/10 showed Uric = 3.8  BACK PAIN, LUMBAR (ICD-724.2) - s/p decompressive laminectomy 3/07 by [name]...  ANXIETY (ICD-300.00)   Past Surgical History  Procedure Laterality Date  . Coronary artery bypass graft  1979    x4  . Coronary artery bypass graft  1993    Redo x5 by Dr Gerrit Friends  . Inguinal hernia repair  1994    Right by Dr Gerrit Friends  . Inguinal hernia repair  1996    Left by Dr. Gerrit Friends  . Decompressive laminectomy  01/2006    L2 - scarum by Dr. Simonne Come    Outpatient Encounter Prescriptions as of 03/19/2013  Medication Sig Dispense Refill  . allopurinol (ZYLOPRIM) 300 MG tablet Take 1 tablet (300 mg total) by mouth daily.  90 tablet  3  . aspirin (ADULT ASPIRIN EC LOW STRENGTH) 81 MG EC tablet Take 81 mg by mouth every morning.       . beta carotene w/minerals (OCUVITE) tablet Take 1 tablet by mouth daily.      . Cholecalciferol (VITAMIN D-3 PO) Take by mouth daily.        . finasteride (PROSCAR) 5 MG tablet Take 5 mg by mouth every 3 (three) days.       . folic acid (FOLVITE) 400 MCG tablet Take 400 mcg by mouth 2 (two) times daily.        [name] losartan (COZAAR) 50 MG tablet Take 1 tablet (50 mg total) by mouth daily.  90 tablet  3  . mesalamine (LIALDA) 1.2 G EC tablet Take 1,200 mg by mouth every other day. He takes on even days of the month.      . methylcellulose (CITRUCEL) oral powder Take 1 table spoon daily      . Methylcellulose, Laxative, (CITRUCEL) 500 MG TABS Take 2 tablets when travelling      . Misc Natural Products (OSTEO  BI-FLEX ADV JOINT SHIELD) TABS Take 1 tablet by mouth 2 (two) times daily.        . Multiple Vitamin (MULITIVITAMIN WITH MINERALS) TABS Take 1 tablet by mouth every morning. He takes Surveyor, quantity.      . nitroGLYCERIN (NITROSTAT) 0.4 MG SL tablet Place 1 tablet (0.4 mg total) under the tongue every 5 (five) minutes as needed. For chest pain.  25 tablet  3  . Polyvinyl Alcohol-Povidone (REFRESH OP) Place 1 drop into both eyes daily as needed. For dry eyes.      . simvastatin (ZOCOR) 40 MG tablet Take 1 tablet (40 mg total) by mouth at bedtime.  90 tablet  1   No facility-administered encounter medications on file as of 03/19/2013.    Allergies  Allergen Reactions  . Lisinopril     REACTION: dizziness  . Methocarbamol     REACTION: pt states "dizzy"  . Pregabalin     REACTION: pt states "dizzy"  . Ramipril  REACTION: hives and dizziness    Current Medications, Allergies, Past Medical History, Past Surgical History, Family History, and Social History were reviewed in [employer] record.    Review of Systems        See HPI - all other systems neg except as noted...  The patient complains of incontinence and muscle weakness.  The patient denies anorexia, fever, weight loss, weight gain, vision loss, decreased hearing, hoarseness, chest pain, syncope, dyspnea on exertion, peripheral edema, prolonged cough, headaches, hemoptysis, abdominal pain, melena, hematochezia, severe indigestion/heartburn, hematuria, suspicious skin lesions, transient blindness, difficulty walking, depression, unusual weight change, abnormal bleeding, enlarged lymph nodes, and angioedema.     Objective:   Physical Exam      WD, WN, 79 y/o WM in NAD... GENERAL:  Alert & oriented; pleasant & cooperative... HEENT:  [place]/AT, EOM-wnl, PERRLA, EACs-clear, TMs-wnl, NOSE-clear, THROAT-clear & wnl. NECK:  Supple w/ fairROM; no JVD; normal carotid impulses w/o bruits; no thyromegaly or nodules  palpated; no lymphadenopathy. CHEST:  Clear to P & A; without wheezes/ rales/ or rhonchi. HEART:  Regular Rhythm; gr 1/6 SEM, w/o rubs or gallops heard... ABDOMEN:  Soft & nontender; normal bowel sounds; no organomegaly or masses detected. EXT: without deformities, mod arthritic changes (esp hands); no varicose veins/ venous insuffic/ or edema. NEURO:  CN's intact; no focal neuro deficits... DERM:  No lesions noted; no rash etc...  RADIOLOGY DATA:  Reviewed in the EPIC EMR & discussed w/ the patient...  LABORATORY DATA:  Reviewed in the EPIC EMR & discussed w/ the patient...   Assessment & Plan:    Cardiac> HBP, CAD> followed by [name]/Crenshaw on ASA, Losartan; s/p CABG 79 & 93; BP controlled, but notes incr tightness- refer to cards to consider f/u cath...     ASPVS> known 3cm AAA being followed; CTAbd done via ER 4/12= +right kid stone w/ hydroneph; AAA measured 3.2cm, infrarenal, & 3.4cm in length; Sonar 1/14 stable- no change in 3.4cm AAA (f/u planned 58yr)...     CHOL> on Simva40 & FLP at goals & seems to be tol med well...     GI> Gerd, Divertics, Polyps, Hx amebic dysentery> followed byDrJacobs on Lialda- pt decr to 1Qod now & stable BMs...     BPH> followed by [name] who switched off Avodart, on Proscar now but only taking every 3rd day?; denies voiding issues, nocturia diminished to 1-2...     DJD, Gout, LBP> stable on Allopurinol, Osteobiflex, Tylenol.Marland KitchenMarland Kitchen

## 2013-03-20 LAB — VITAMIN D 25 HYDROXY (VIT D DEFICIENCY, FRACTURES): Vit D, 25-Hydroxy: 45 ng/mL (ref 30–89)

## 2013-04-04 ENCOUNTER — Ambulatory Visit (INDEPENDENT_AMBULATORY_CARE_PROVIDER_SITE_OTHER)
Admission: RE | Admit: 2013-04-04 | Discharge: 2013-04-04 | Disposition: A | Discharge status: Home / Self Care | Payer: Medicare Other | Source: Ambulatory Visit | Attending: Cardiology | Admitting: Cardiology

## 2013-04-04 ENCOUNTER — Encounter: Payer: Self-pay | Admitting: Cardiology

## 2013-04-04 ENCOUNTER — Ambulatory Visit (INDEPENDENT_AMBULATORY_CARE_PROVIDER_SITE_OTHER): Payer: Medicare Other | Admitting: Cardiology

## 2013-04-04 ENCOUNTER — Encounter: Payer: Self-pay | Admitting: *Deleted

## 2013-04-04 ENCOUNTER — Other Ambulatory Visit: Payer: Self-pay | Admitting: Cardiology

## 2013-04-04 VITALS — BP 149/78 | HR 64 | Wt 176.0 lb

## 2013-04-04 DIAGNOSIS — I2510 Atherosclerotic heart disease of native coronary artery without angina pectoris: Secondary | ICD-10-CM | POA: Diagnosis not present

## 2013-04-04 DIAGNOSIS — I714 Abdominal aortic aneurysm, without rupture: Secondary | ICD-10-CM | POA: Diagnosis not present

## 2013-04-04 DIAGNOSIS — I208 Other forms of angina pectoris: Secondary | ICD-10-CM

## 2013-04-04 DIAGNOSIS — R079 Chest pain, unspecified: Secondary | ICD-10-CM | POA: Diagnosis not present

## 2013-04-04 DIAGNOSIS — E7800 Pure hypercholesterolemia, unspecified: Secondary | ICD-10-CM

## 2013-04-04 DIAGNOSIS — I10 Essential (primary) hypertension: Secondary | ICD-10-CM

## 2013-04-04 LAB — CBC WITH DIFFERENTIAL/PLATELET
Basophils Relative: 0.6 % (ref 0.0–3.0)
Eosinophils Relative: 2.1 % (ref 0.0–5.0)
HCT: 39.5 % (ref 39.0–52.0)
Lymphs Abs: 1.9 10*3/uL (ref 0.7–4.0)
MCV: 97.7 fl (ref 78.0–100.0)
Monocytes Absolute: 0.6 10*3/uL (ref 0.1–1.0)
Neutro Abs: 4.8 10*3/uL (ref 1.4–7.7)
RBC: 4.04 Mil/uL — ABNORMAL LOW (ref 4.22–5.81)
WBC: 7.4 10*3/uL (ref 4.5–10.5)

## 2013-04-04 LAB — BASIC METABOLIC PANEL
BUN: 20 mg/dL (ref 6–23)
Chloride: 105 mEq/L (ref 96–112)
Potassium: 4.1 mEq/L (ref 3.5–5.1)

## 2013-04-04 MED ORDER — AMLODIPINE BESYLATE 5 MG PO TABS: 5.0000 mg | ORAL_TABLET | Freq: Every day | ORAL | Status: DC

## 2013-04-04 NOTE — Progress Notes (Signed)
HPI: 58 show male previously followed by Dr. Riley Kill for followup of coronary artery disease. Patient has had prior coronary artery bypass and graft in 1979 and 1993. Myoview in 2001 showed an ejection fraction of 50% and scar in the inferior wall but no ischemia. Abdominal ultrasound in January of 2014 showed an aneurysm of 3.3 x 3.4 cm. Followup recommended in one year. Patient states that for the past 6 months he has had chest pressure with exertion relieved with rest. 3 weeks ago he had the pain while sitting on a plane for 5 minutes. The pain does not radiate. He has had none in the past 3 weeks. He denies dyspnea or syncope.  Current Outpatient Prescriptions  Medication Sig Dispense Refill  . allopurinol (ZYLOPRIM) 300 MG tablet Take 1 tablet (300 mg total) by mouth daily.  90 tablet  3  . aspirin (ADULT ASPIRIN EC LOW STRENGTH) 81 MG EC tablet Take 81 mg by mouth every morning.       . beta carotene w/minerals (OCUVITE) tablet Take 1 tablet by mouth daily.      . Cholecalciferol (VITAMIN D-3 PO) Take by mouth daily.        . finasteride (PROSCAR) 5 MG tablet Take 5 mg by mouth every 3 (three) days.       . folic acid (FOLVITE) 400 MCG tablet Take 400 mcg by mouth 2 (two) times daily.        [name] losartan (COZAAR) 50 MG tablet Take 1 tablet (50 mg total) by mouth daily.  90 tablet  3  . mesalamine (LIALDA) 1.2 G EC tablet Take 1,200 mg by mouth every other day. He takes on even days of the month.      . Methylcellulose, Laxative, (CITRUCEL) 500 MG TABS Take 2 tablets when travelling      . Misc Natural Products (OSTEO BI-FLEX ADV JOINT SHIELD) TABS Take 1 tablet by mouth 2 (two) times daily.        . Multiple Vitamin (MULITIVITAMIN WITH MINERALS) TABS Take 1 tablet by mouth every morning. He takes Surveyor, quantity.      . nitroGLYCERIN (NITROSTAT) 0.4 MG SL tablet Place 1 tablet (0.4 mg total) under the tongue every 5 (five) minutes as needed. For chest pain.  25 tablet  3  . Polyvinyl  Alcohol-Povidone (REFRESH OP) Place 1 drop into both eyes daily as needed. For dry eyes.      . simvastatin (ZOCOR) 40 MG tablet Take 1 tablet (40 mg total) by mouth at bedtime.  90 tablet  1   No current facility-administered medications for this visit.     Past Medical History  Diagnosis Date  . Cerumen impaction     Bilateral  . Hypertension   . CAD (coronary artery disease)   . Peripheral vascular disease   . Abdominal aortic aneurysm   . Hypercholesterolemia   . GERD (gastroesophageal reflux disease)   . Diverticulosis of colon   . Amebic dysentery   . Benign prostatic hypertrophy   . Degenerative joint disease   . Gout   . Lumbar back pain   . Anxiety   . Nephrolithiasis     Past Surgical History  Procedure Laterality Date  . Coronary artery bypass graft  1979    x4  . Coronary artery bypass graft  1993    Redo x5 by Dr Gerrit Friends  . Inguinal hernia repair  1994    Right by Dr Gerrit Friends  . Inguinal hernia repair  1996    Left by Dr. Gerrit Friends  . Decompressive laminectomy  01/2006    L2 - scarum by Dr. Simonne Come    History   Social History  . Marital Status: Married    Spouse Name: Magda Paganini x 64 yrs    Number of Children: N/A  . Years of Education: N/A   Occupational History  . 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 Smoking status: Former Smoker    Quit date: 11/28/1944  . Smokeless tobacco: Never Used  . Alcohol Use: 7.0 oz/week    14 drink(s) per week  . Drug Use: Not on file  . Sexually Active: Not on file   Other Topics Concern  . Not on file   Social History Narrative   Married   7 children    ROS: no fevers or chills, productive cough, hemoptysis, dysphasia, odynophagia, melena, hematochezia, dysuria, hematuria, rash, seizure activity, orthopnea, PND, pedal edema, claudication. Remaining systems are negative.  Physical Exam: Well-developed well-nourished in no acute distress.  Skin is warm and dry.  HEENT  is normal.  Neck is supple.  Chest is clear to auscultation with normal expansion.  Cardiovascular exam is regular rate and rhythm.  Abdominal exam nontender or distended. No masses palpated. Extremities show no edema. neuro grossly intact  ECG sinus rhythm at a rate of 64. Left bundle branch block.

## 2013-04-04 NOTE — Patient Instructions (Addendum)
Your physician has requested that you have a cardiac catheterization. Cardiac catheterization is used to diagnose and/or treat various heart conditions. Doctors may recommend this procedure for a number of different reasons. The most common reason is to evaluate chest pain. Chest pain can be a symptom of coronary artery disease (CAD), and cardiac catheterization can show whether plaque is narrowing or blocking your heart's arteries. This procedure is also used to evaluate the valves, as well as measure the blood flow and oxygen levels in different parts of your heart. For further information please visit [email or web address] Please follow instruction sheet, as given.   Your physician recommends that you HAVE LAB WORK TODAY  A chest x-ray takes a picture of the organs and structures inside the chest, including the heart, lungs, and blood vessels. This test can show several things, including, whether the heart is enlarges; whether fluid is building up in the lungs; and whether pacemaker / defibrillator leads are still in place. AT [address] OFFICE  START AMLODIPINE 5 MG ONCE DAILY

## 2013-04-04 NOTE — Assessment & Plan Note (Addendum)
Patient is having exertional chest pain for the past 6 months. He had an episode at rest 3 weeks ago. We discussed the options today including medical therapy versus stress test versus catheterization. He is very vigorous. I have elected to proceed with cardiac catheterization. The risks and benefits were discussed and the patient agrees to proceed. Continue aspirin and statin. Add Norvasc 5 mg daily both for blood pressure and as an antianginal. We will obtain previous catheterization reports from his paper chart prior to his procedure.   Patient's paper chart obtained and reviewed. Patient had coronary artery bypassing graft in 1979 with a saphenous vein graft to the diagonal and LAD. He also had a saphenous vein graft to the obtuse marginal and PDA.  In November of 1993 the patient had redo coronary artery bypass and graft with a LIMA sequentially to the LAD and diagonal, saphenous vein graft to the obtuse marginal and a sequential saphenous vein graft to the acute marginal and posterior lateral.

## 2013-04-04 NOTE — Assessment & Plan Note (Signed)
Blood pressure is elevated. Add Norvasc 5 mg daily.

## 2013-04-04 NOTE — Assessment & Plan Note (Signed)
Continue statin. 

## 2013-04-04 NOTE — Assessment & Plan Note (Signed)
Patient will need a followup ultrasound in January of 2015.

## 2013-04-12 ENCOUNTER — Encounter (HOSPITAL_BASED_OUTPATIENT_CLINIC_OR_DEPARTMENT_OTHER): Payer: Self-pay | Admitting: *Deleted

## 2013-04-12 ENCOUNTER — Encounter (HOSPITAL_BASED_OUTPATIENT_CLINIC_OR_DEPARTMENT_OTHER)
Admission: RE | Disposition: A | Discharge status: Home / Self Care | Payer: Self-pay | Source: Ambulatory Visit | Attending: Internal Medicine

## 2013-04-12 ENCOUNTER — Inpatient Hospital Stay (HOSPITAL_BASED_OUTPATIENT_CLINIC_OR_DEPARTMENT_OTHER)
Admission: RE | Admit: 2013-04-12 | Discharge: 2013-04-12 | Disposition: A | Discharge status: Home / Self Care | Payer: Medicare Other | Source: Ambulatory Visit | Attending: Internal Medicine | Admitting: Internal Medicine

## 2013-04-12 DIAGNOSIS — Z79899 Other long term (current) drug therapy: Secondary | ICD-10-CM | POA: Insufficient documentation

## 2013-04-12 DIAGNOSIS — I25810 Atherosclerosis of coronary artery bypass graft(s) without angina pectoris: Secondary | ICD-10-CM | POA: Insufficient documentation

## 2013-04-12 DIAGNOSIS — I714 Abdominal aortic aneurysm, without rupture, unspecified: Secondary | ICD-10-CM | POA: Insufficient documentation

## 2013-04-12 DIAGNOSIS — I2510 Atherosclerotic heart disease of native coronary artery without angina pectoris: Secondary | ICD-10-CM | POA: Insufficient documentation

## 2013-04-12 DIAGNOSIS — E7800 Pure hypercholesterolemia, unspecified: Secondary | ICD-10-CM | POA: Insufficient documentation

## 2013-04-12 DIAGNOSIS — I200 Unstable angina: Secondary | ICD-10-CM | POA: Diagnosis not present

## 2013-04-12 DIAGNOSIS — I10 Essential (primary) hypertension: Secondary | ICD-10-CM | POA: Insufficient documentation

## 2013-04-12 DIAGNOSIS — I208 Other forms of angina pectoris: Secondary | ICD-10-CM

## 2013-04-12 SURGERY — JV LEFT HEART CATHETERIZATION WITH CORONARY/GRAFT ANGIOGRAM

## 2013-04-12 MED ORDER — DIAZEPAM 2 MG PO TABS
2.0000 mg | ORAL_TABLET | ORAL | Status: AC
  Administered 2013-04-12: 5 mg via ORAL

## 2013-04-12 MED ORDER — SODIUM CHLORIDE 0.9 % IV SOLN: INTRAVENOUS | Status: DC

## 2013-04-12 MED ORDER — ONDANSETRON HCL 4 MG/2ML IJ SOLN: 4.0000 mg | Freq: Four times a day (QID) | INTRAMUSCULAR | Status: DC | PRN

## 2013-04-12 MED ORDER — SODIUM CHLORIDE 0.9 % IJ SOLN: 3.0000 mL | Freq: Two times a day (BID) | INTRAMUSCULAR | Status: DC

## 2013-04-12 MED ORDER — ACETAMINOPHEN 325 MG PO TABS: 650.0000 mg | ORAL_TABLET | ORAL | Status: DC | PRN

## 2013-04-12 MED ORDER — SODIUM CHLORIDE 0.9 % IJ SOLN
3.0000 mL | INTRAMUSCULAR | Status: DC | PRN
  Administered 2013-04-12: 3 mL via INTRAVENOUS

## 2013-04-12 MED ORDER — SODIUM CHLORIDE 0.9 % IV SOLN: 1.0000 mL/kg/h | INTRAVENOUS | Status: DC

## 2013-04-12 MED ORDER — ASPIRIN 81 MG PO CHEW
324.0000 mg | CHEWABLE_TABLET | ORAL | Status: AC
  Administered 2013-04-12: 243 mg via ORAL

## 2013-04-12 MED ORDER — SODIUM CHLORIDE 0.9 % IV SOLN: 250.0000 mL | INTRAVENOUS | Status: DC | PRN

## 2013-04-12 NOTE — Progress Notes (Signed)
Bedrest begins @ 1045, site level 0, tegaderm dressing applied to right groin site.

## 2013-04-12 NOTE — Interval H&P Note (Signed)
History and Physical Interval Note:  04/12/2013 9:11 AM  Daniel Reeves  has presented today for surgery, with the diagnosis of cp  The various methods of treatment have been discussed with the patient and family. After consideration of risks, benefits and other options for treatment, the patient has consented to  Procedure(s): JV LEFT HEART CATHETERIZATION WITH CORONARY/GRAFT ANGIOGRAM (N/A) as a surgical intervention .  The patient's history has been reviewed, patient examined, no change in status, stable for surgery.  I have reviewed the patient's chart and labs.  Questions were answered to the patient's satisfaction.     [name]

## 2013-04-12 NOTE — H&P (View-Only) (Signed)
 HPI: 88 show male previously followed by Dr. Stuckey for followup of coronary artery disease. Patient has had prior coronary artery bypass and graft in 1979 and 1993. Myoview in 2001 showed an ejection fraction of 50% and scar in the inferior wall but no ischemia. Abdominal ultrasound in January of 2014 showed an aneurysm of 3.3 x 3.4 cm. Followup recommended in one year. Patient states that for the past 6 months he has had chest pressure with exertion relieved with rest. 3 weeks ago he had the pain while sitting on a plane for 5 minutes. The pain does not radiate. He has had none in the past 3 weeks. He denies dyspnea or syncope.  Current Outpatient Prescriptions  Medication Sig Dispense Refill  . allopurinol (ZYLOPRIM) 300 MG tablet Take 1 tablet (300 mg total) by mouth daily.  90 tablet  3  . aspirin (ADULT ASPIRIN EC LOW STRENGTH) 81 MG EC tablet Take 81 mg by mouth every morning.       . beta carotene w/minerals (OCUVITE) tablet Take 1 tablet by mouth daily.      . Cholecalciferol (VITAMIN D-3 PO) Take by mouth daily.        . finasteride (PROSCAR) 5 MG tablet Take 5 mg by mouth every 3 (three) days.       . folic acid (FOLVITE) 400 MCG tablet Take 400 mcg by mouth 2 (two) times daily.        . losartan (COZAAR) 50 MG tablet Take 1 tablet (50 mg total) by mouth daily.  90 tablet  3  . mesalamine (LIALDA) 1.2 G EC tablet Take 1,200 mg by mouth every other day. He takes on even days of the month.      . Methylcellulose, Laxative, (CITRUCEL) 500 MG TABS Take 2 tablets when travelling      . Misc Natural Products (OSTEO BI-FLEX ADV JOINT SHIELD) TABS Take 1 tablet by mouth 2 (two) times daily.        . Multiple Vitamin (MULITIVITAMIN WITH MINERALS) TABS Take 1 tablet by mouth every morning. He takes Centrum Silver.      . nitroGLYCERIN (NITROSTAT) 0.4 MG SL tablet Place 1 tablet (0.4 mg total) under the tongue every 5 (five) minutes as needed. For chest pain.  25 tablet  3  . Polyvinyl  Alcohol-Povidone (REFRESH OP) Place 1 drop into both eyes daily as needed. For dry eyes.      . simvastatin (ZOCOR) 40 MG tablet Take 1 tablet (40 mg total) by mouth at bedtime.  90 tablet  1   No current facility-administered medications for this visit.     Past Medical History  Diagnosis Date  . Cerumen impaction     Bilateral  . Hypertension   . CAD (coronary artery disease)   . Peripheral vascular disease   . Abdominal aortic aneurysm   . Hypercholesterolemia   . GERD (gastroesophageal reflux disease)   . Diverticulosis of colon   . Amebic dysentery   . Benign prostatic hypertrophy   . Degenerative joint disease   . Gout   . Lumbar back pain   . Anxiety   . Nephrolithiasis     Past Surgical History  Procedure Laterality Date  . Coronary artery bypass graft  1979    x4  . Coronary artery bypass graft  1993    Redo x5 by Dr Gerkin  . Inguinal hernia repair  1994    Right by Dr Gerkin  . Inguinal hernia repair    1996    Left by Dr. Gerkin  . Decompressive laminectomy  01/2006    L2 - scarum by Dr. Aplington    History   Social History  . Marital Status: Married    Spouse Name: Audrey x 64 yrs    Number of Children: N/A  . Years of Education: N/A   Occupational History  . 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 Smoking status: Former Smoker    Quit date: 11/28/1944  . Smokeless tobacco: Never Used  . Alcohol Use: 7.0 oz/week    14 drink(s) per week  . Drug Use: Not on file  . Sexually Active: Not on file   Other Topics Concern  . Not on file   Social History Narrative   Married   7 children    ROS: no fevers or chills, productive cough, hemoptysis, dysphasia, odynophagia, melena, hematochezia, dysuria, hematuria, rash, seizure activity, orthopnea, PND, pedal edema, claudication. Remaining systems are negative.  Physical Exam: Well-developed well-nourished in no acute distress.  Skin is warm and dry.  HEENT  is normal.  Neck is supple.  Chest is clear to auscultation with normal expansion.  Cardiovascular exam is regular rate and rhythm.  Abdominal exam nontender or distended. No masses palpated. Extremities show no edema. neuro grossly intact  ECG sinus rhythm at a rate of 64. Left bundle branch block.     

## 2013-04-12 NOTE — CV Procedure (Signed)
Cardiac Cath Procedure Note:  Indication: Progressive exertional angina  Procedures performed:  1) Selective coronary angiography 2) Left heart catheterization 3) Left ventriculogram 4) SVG angio 5) LIMA angio  Description of procedure:   The risks and indication of the procedure were explained. Consent was signed and placed on the chart. An appropriate timeout was taken prior to the procedure. The right groin was prepped and draped in the routine sterile fashion and anesthetized with 1% local lidocaine.   A 5 FR arterial sheath was placed in the right femoral artery using a modified Seldinger technique. Standard catheters including a JL4, JR4 and angled pigtail were used. All catheter exchanges were made over a wire.  Complications:  None apparent  Findings:  Ao Pressure: LV Pressure: There was no signficant gradient across the aortic valve on pullback.  Left main: Subtotally occlude distally. Gives off tiny Ramus branch.  LAD: Occluded ostially   LCX: Occluded ostially  RCA: Occluded proximally  Old grafts not found. Presume occluded.  LIMA - DIAG - LAD: Patent. Distal LAD occluded after insertion of LIMA  SVG - OM: Occluded  SVG - AM -PDA: Patent. 50% focal lesion in graft after insertion to acute marginal. High grade hazy lesion in distal great just prior to insertion into PDA. Likely 80-90% stenosis. The distal RCA system is very large and feeds collaterals to the LCX system.   LV-gram done in the RAO projection: Ejection fraction = 25% Global HK  Assessment:  1. Severe native 3VCAD 2. SVG-OM: occluded 3. LIMA-DIAG-LAD: patent 4. High grade lesion in SVG-PDA.  5. EF 25%  Plan/Discussion:  The lesion in SVG to right system appears high grade and somewhat thromobotic. I have reviewed with Drs. Riley Kill and [place] who agree that lesion is likely significant and should be addressed , if possible. Given the graft is 77 years old I had along talk with Mr. Baize and  his family about the risks of intervention vs medical therapy. Including graft rupture and death. They would like to proceed. Will load Plavix. Schedule PCI with Dr. Excell Seltzer next week.   Daniel Bensimhon,MD 10:47 AM

## 2013-04-15 ENCOUNTER — Encounter (HOSPITAL_COMMUNITY): Payer: Self-pay | Admitting: Pharmacy Technician

## 2013-04-18 ENCOUNTER — Encounter (HOSPITAL_COMMUNITY)
Admission: RE | Disposition: A | Discharge status: Home / Self Care | Payer: Self-pay | Source: Ambulatory Visit | Attending: Cardiovascular Disease

## 2013-04-18 ENCOUNTER — Encounter (HOSPITAL_COMMUNITY): Payer: Self-pay | Admitting: General Practice

## 2013-04-18 ENCOUNTER — Ambulatory Visit (HOSPITAL_COMMUNITY)
Admission: RE | Admit: 2013-04-18 | Discharge: 2013-04-19 | Disposition: A | Discharge status: Home / Self Care | Payer: Medicare Other | Source: Ambulatory Visit | Attending: Cardiovascular Disease | Admitting: Cardiovascular Disease

## 2013-04-18 DIAGNOSIS — E7800 Pure hypercholesterolemia, unspecified: Secondary | ICD-10-CM | POA: Diagnosis not present

## 2013-04-18 DIAGNOSIS — I714 Abdominal aortic aneurysm, without rupture, unspecified: Secondary | ICD-10-CM | POA: Insufficient documentation

## 2013-04-18 DIAGNOSIS — Z955 Presence of coronary angioplasty implant and graft: Secondary | ICD-10-CM

## 2013-04-18 DIAGNOSIS — I10 Essential (primary) hypertension: Secondary | ICD-10-CM | POA: Insufficient documentation

## 2013-04-18 DIAGNOSIS — Z79899 Other long term (current) drug therapy: Secondary | ICD-10-CM | POA: Insufficient documentation

## 2013-04-18 DIAGNOSIS — I25810 Atherosclerosis of coronary artery bypass graft(s) without angina pectoris: Secondary | ICD-10-CM | POA: Insufficient documentation

## 2013-04-18 DIAGNOSIS — I25700 Atherosclerosis of coronary artery bypass graft(s), unspecified, with unstable angina pectoris: Secondary | ICD-10-CM | POA: Diagnosis present

## 2013-04-18 DIAGNOSIS — I200 Unstable angina: Secondary | ICD-10-CM | POA: Insufficient documentation

## 2013-04-18 HISTORY — PX: PERCUTANEOUS CORONARY STENT INTERVENTION (PCI-S): SHX5485

## 2013-04-18 HISTORY — DX: Cardiac murmur, unspecified: R01.1

## 2013-04-18 HISTORY — PX: CORONARY ANGIOPLASTY WITH STENT PLACEMENT: SHX49

## 2013-04-18 LAB — POCT ACTIVATED CLOTTING TIME: Activated Clotting Time: 268 seconds

## 2013-04-18 LAB — BASIC METABOLIC PANEL
BUN: 23 mg/dL (ref 6–23)
CO2: 23 mEq/L (ref 19–32)
Calcium: 9.5 mg/dL (ref 8.4–10.5)
Chloride: 103 mEq/L (ref 96–112)
Creatinine, Ser: 1.04 mg/dL (ref 0.50–1.35)

## 2013-04-18 LAB — CBC
HCT: 38.7 % — ABNORMAL LOW (ref 39.0–52.0)
MCH: 32.6 pg (ref 26.0–34.0)
MCV: 92.8 fL (ref 78.0–100.0)
RBC: 4.17 MIL/uL — ABNORMAL LOW (ref 4.22–5.81)
RDW: 13.8 % (ref 11.5–15.5)
WBC: 6.5 10*3/uL (ref 4.0–10.5)

## 2013-04-18 SURGERY — PERCUTANEOUS CORONARY STENT INTERVENTION (PCI-S)
Anesthesia: LOCAL

## 2013-04-18 MED ORDER — SODIUM CHLORIDE 0.9 % IV SOLN
INTRAVENOUS | Status: DC
  Administered 2013-04-18: 08:00:00 via INTRAVENOUS

## 2013-04-18 MED ORDER — ADULT MULTIVITAMIN W/MINERALS CH
1.0000 | ORAL_TABLET | Freq: Every day | ORAL | Status: DC
  Administered 2013-04-19: 10:00:00 1 via ORAL
  Filled 2013-04-18 (×2): qty 1

## 2013-04-18 MED ORDER — MESALAMINE 1.2 G PO TBEC: 1200.0000 mg | DELAYED_RELEASE_TABLET | ORAL | Status: DC

## 2013-04-18 MED ORDER — ASPIRIN EC 81 MG PO TBEC
81.0000 mg | DELAYED_RELEASE_TABLET | Freq: Every evening | ORAL | Status: DC
  Filled 2013-04-18 (×2): qty 1

## 2013-04-18 MED ORDER — SODIUM CHLORIDE 0.9 % IV SOLN: 250.0000 mL | INTRAVENOUS | Status: DC | PRN

## 2013-04-18 MED ORDER — NITROGLYCERIN 0.4 MG SL SUBL
0.4000 mg | SUBLINGUAL_TABLET | SUBLINGUAL | Status: DC | PRN
Start: 2013-04-18 — End: 2013-04-19
  Filled 2013-04-18: qty 25

## 2013-04-18 MED ORDER — SODIUM CHLORIDE 0.9 % IJ SOLN: 3.0000 mL | Freq: Two times a day (BID) | INTRAMUSCULAR | Status: DC

## 2013-04-18 MED ORDER — FINASTERIDE 5 MG PO TABS: 5.0000 mg | ORAL_TABLET | ORAL | Status: DC

## 2013-04-18 MED ORDER — SODIUM CHLORIDE 0.9 % IJ SOLN
3.0000 mL | Freq: Two times a day (BID) | INTRAMUSCULAR | Status: DC
  Administered 2013-04-19: 3 mL via INTRAVENOUS

## 2013-04-18 MED ORDER — PSYLLIUM 95 % PO PACK
1.0000 | PACK | Freq: Every day | ORAL | Status: DC
  Filled 2013-04-18 (×2): qty 1

## 2013-04-18 MED ORDER — MIDAZOLAM HCL 2 MG/2ML IJ SOLN
INTRAMUSCULAR | Status: AC
  Filled 2013-04-18: qty 2

## 2013-04-18 MED ORDER — SODIUM CHLORIDE 0.9 % IV SOLN: 1.0000 mL/kg/h | INTRAVENOUS | Status: AC

## 2013-04-18 MED ORDER — CLOPIDOGREL BISULFATE 75 MG PO TABS
75.0000 mg | ORAL_TABLET | Freq: Every day | ORAL | Status: DC
  Administered 2013-04-19: 09:00:00 75 mg via ORAL
  Filled 2013-04-18: qty 1

## 2013-04-18 MED ORDER — HEPARIN SODIUM (PORCINE) 1000 UNIT/ML IJ SOLN
INTRAMUSCULAR | Status: AC
  Filled 2013-04-18: qty 1

## 2013-04-18 MED ORDER — ACETAMINOPHEN 325 MG PO TABS: 650.0000 mg | ORAL_TABLET | ORAL | Status: DC | PRN

## 2013-04-18 MED ORDER — METHYLCELLULOSE (LAXATIVE) PO POWD: 1.0000 | Freq: Every day | ORAL | Status: DC

## 2013-04-18 MED ORDER — ATORVASTATIN CALCIUM 20 MG PO TABS
20.0000 mg | ORAL_TABLET | Freq: Every day | ORAL | Status: DC
  Administered 2013-04-18: 20 mg via ORAL
  Filled 2013-04-18 (×2): qty 1

## 2013-04-18 MED ORDER — FENTANYL CITRATE 0.05 MG/ML IJ SOLN
INTRAMUSCULAR | Status: AC
  Filled 2013-04-18: qty 2

## 2013-04-18 MED ORDER — SODIUM CHLORIDE 0.9 % IJ SOLN: 3.0000 mL | INTRAMUSCULAR | Status: DC | PRN

## 2013-04-18 MED ORDER — SIMVASTATIN 40 MG PO TABS
40.0000 mg | ORAL_TABLET | Freq: Every evening | ORAL | Status: DC
  Filled 2013-04-18: qty 1

## 2013-04-18 MED ORDER — HEPARIN (PORCINE) IN NACL 2-0.9 UNIT/ML-% IJ SOLN
INTRAMUSCULAR | Status: AC
  Filled 2013-04-18: qty 1000

## 2013-04-18 MED ORDER — ASPIRIN 81 MG PO CHEW
324.0000 mg | CHEWABLE_TABLET | ORAL | Status: AC
  Administered 2013-04-18: 324 mg via ORAL
  Filled 2013-04-18: qty 4

## 2013-04-18 MED ORDER — LOSARTAN POTASSIUM 50 MG PO TABS
50.0000 mg | ORAL_TABLET | Freq: Every day | ORAL | Status: DC
  Administered 2013-04-19: 09:00:00 50 mg via ORAL
  Filled 2013-04-18 (×2): qty 1

## 2013-04-18 MED ORDER — AMLODIPINE BESYLATE 5 MG PO TABS
5.0000 mg | ORAL_TABLET | Freq: Every day | ORAL | Status: DC
  Administered 2013-04-19: 10:00:00 5 mg via ORAL
  Filled 2013-04-18 (×2): qty 1

## 2013-04-18 MED ORDER — ALLOPURINOL 300 MG PO TABS
300.0000 mg | ORAL_TABLET | Freq: Every day | ORAL | Status: DC
  Administered 2013-04-19: 300 mg via ORAL
  Filled 2013-04-18 (×2): qty 1

## 2013-04-18 MED ORDER — LIDOCAINE HCL (PF) 1 % IJ SOLN
INTRAMUSCULAR | Status: AC
  Filled 2013-04-18: qty 30

## 2013-04-18 MED ORDER — VERAPAMIL HCL 2.5 MG/ML IV SOLN
INTRAVENOUS | Status: AC
  Filled 2013-04-18: qty 2

## 2013-04-18 MED ORDER — DIAZEPAM 5 MG PO TABS
5.0000 mg | ORAL_TABLET | ORAL | Status: AC
  Administered 2013-04-18: 5 mg via ORAL
  Filled 2013-04-18: qty 1

## 2013-04-18 MED ORDER — ONDANSETRON HCL 4 MG/2ML IJ SOLN: 4.0000 mg | Freq: Four times a day (QID) | INTRAMUSCULAR | Status: DC | PRN

## 2013-04-18 MED ORDER — MESALAMINE 1.2 G PO TBEC
1200.0000 mg | DELAYED_RELEASE_TABLET | ORAL | Status: DC
  Filled 2013-04-18: qty 1

## 2013-04-18 NOTE — CV Procedure (Signed)
   CARDIAC CATH NOTE  Name: Daniel Reeves MRN: [id number] DOB: 09/25/1924  Procedure: PTCA and stenting of the saphenous vein graft to RCA, intravascular ultrasound of the saphenous vein graft RCA.  Indication: 79 year old gentleman with CCS class III anginal symptoms. He underwent diagnostic coronary angiography last week by Dr. Gala Romney. This demonstrated severe irregular stenosis in the distal sequence of the saphenous vein graft to distal RCA. This was an 80% lesion with marked irregularity. We reviewed the films with colleagues and decided that she be treated percutaneously. We thought this would potentially help with his anginal symptoms or as well as with continued graft patency. There was a more proximal lesion at the origin of the first anastomotic site with an acute marginal branch. This lesion was indeterminate. I plan on doing more imaging during this procedure to determine if that area require treatment.  Procedural Details: The right wrist was prepped, draped, and anesthetized with 1% lidocaine. Using the modified Seldinger technique, a 6 Fr sheath was introduced into the radial artery. 3 mg verapamil was administered through the radial sheath. Weight-based heparin was given for anticoagulation. Once a therapeutic ACT was achieved, a 6 [place] RCB guide catheter was inserted.  A BMW coronary guidewire was used to cross the lesion.  Intracoronary for abdominal was administered. The lesion was to distal for a distal embolic protection device.  The lesion was then stented with a 3.5 x 12 mm Promus premier drug-eluting stent.  The stent was deployed at 12 atmospheres and then redilated at 14 atmospheres.   Following PCI, there was 0% residual stenosis and TIMI-3 flow. Final angiography confirmed an excellent result. Further imaging was still indeterminate for the more proximal lesion in the vein graft. I could not tell whether this resulted in significant stenosis versus overlap from the  anastomotic site. I elected to proceed with intravascular ultrasound. Another 400 mcg of verapamil was administered. 2000 units of unfractionated heparin was administered. The IVUS catheter was advanced beyond the area of concern and mechanical pullback was done. This demonstrated nonobstructive plaque with a minimal lumen area greater than 5 mm. The patient tolerated the procedure well. There were no immediate procedural complications. A TR band was used for radial hemostasis. The patient was transferred to the post catheterization recovery area for further monitoring.  Lesion Data: Vessel: Saphenous vein graft to RCA, distal body of graft Percent stenosis (pre): 80 TIMI-flow (pre):  3 Stent:  3 5 x 12 mm Promus premier drug-eluting Percent stenosis (post): 0 TIMI-flow (post): 3  Conclusions: Successful stenting of the saphenous vein graft to distal RCA utilizing a single drug-eluting stent.  Nonobstructive stenosis in the mid body of the graft at the anastomotic site of the first limb of the graft.  Recommendations: Dual antiplatelet therapy with aspirin and Plavix for at least 12 months.  Daniel Reeves 04/18/2013, 12:05 PM

## 2013-04-18 NOTE — Interval H&P Note (Signed)
History and Physical Interval Note:  04/18/2013 7:29 AM  Daniel Reeves  has presented today for surgery, with the diagnosis of blockage  The various methods of treatment have been discussed with the patient and family. After consideration of risks, benefits and other options for treatment, the patient has consented to  Procedure(s): PERCUTANEOUS CORONARY STENT INTERVENTION (PCI-S) (N/A) as a surgical intervention .  The patient's history has been reviewed, patient examined, no change in status, stable for surgery.  I have reviewed the patient's chart and labs.  Questions were answered to the patient's satisfaction.    The patient has undergone diagnostic catheterization. His films have been carefully reviewed. These had been reviewed with both Dr. Riley Kill and Dr. Gala Romney. We all agree that PCI of the saphenous vein graft to the distal right coronary artery is appropriate. There is severe stenosis with marked irregularity through that region. The patient has been counseled extensively this morning. He understands that his risk is increased considering his advanced age, remote bypass grafting, and severely reduced LV function. He understands the risks and agrees to proceed.  [name]

## 2013-04-18 NOTE — H&P (View-Only) (Signed)
 HPI: 88 show male previously followed by Dr. Stuckey for followup of coronary artery disease. Patient has had prior coronary artery bypass and graft in 1979 and 1993. Myoview in 2001 showed an ejection fraction of 50% and scar in the inferior wall but no ischemia. Abdominal ultrasound in January of 2014 showed an aneurysm of 3.3 x 3.4 cm. Followup recommended in one year. Patient states that for the past 6 months he has had chest pressure with exertion relieved with rest. 3 weeks ago he had the pain while sitting on a plane for 5 minutes. The pain does not radiate. He has had none in the past 3 weeks. He denies dyspnea or syncope.  Current Outpatient Prescriptions  Medication Sig Dispense Refill  . allopurinol (ZYLOPRIM) 300 MG tablet Take 1 tablet (300 mg total) by mouth daily.  90 tablet  3  . aspirin (ADULT ASPIRIN EC LOW STRENGTH) 81 MG EC tablet Take 81 mg by mouth every morning.       . beta carotene w/minerals (OCUVITE) tablet Take 1 tablet by mouth daily.      . Cholecalciferol (VITAMIN D-3 PO) Take by mouth daily.        . finasteride (PROSCAR) 5 MG tablet Take 5 mg by mouth every 3 (three) days.       . folic acid (FOLVITE) 400 MCG tablet Take 400 mcg by mouth 2 (two) times daily.        . losartan (COZAAR) 50 MG tablet Take 1 tablet (50 mg total) by mouth daily.  90 tablet  3  . mesalamine (LIALDA) 1.2 G EC tablet Take 1,200 mg by mouth every other day. He takes on even days of the month.      . Methylcellulose, Laxative, (CITRUCEL) 500 MG TABS Take 2 tablets when travelling      . Misc Natural Products (OSTEO BI-FLEX ADV JOINT SHIELD) TABS Take 1 tablet by mouth 2 (two) times daily.        . Multiple Vitamin (MULITIVITAMIN WITH MINERALS) TABS Take 1 tablet by mouth every morning. He takes Centrum Silver.      . nitroGLYCERIN (NITROSTAT) 0.4 MG SL tablet Place 1 tablet (0.4 mg total) under the tongue every 5 (five) minutes as needed. For chest pain.  25 tablet  3  . Polyvinyl  Alcohol-Povidone (REFRESH OP) Place 1 drop into both eyes daily as needed. For dry eyes.      . simvastatin (ZOCOR) 40 MG tablet Take 1 tablet (40 mg total) by mouth at bedtime.  90 tablet  1   No current facility-administered medications for this visit.     Past Medical History  Diagnosis Date  . Cerumen impaction     Bilateral  . Hypertension   . CAD (coronary artery disease)   . Peripheral vascular disease   . Abdominal aortic aneurysm   . Hypercholesterolemia   . GERD (gastroesophageal reflux disease)   . Diverticulosis of colon   . Amebic dysentery   . Benign prostatic hypertrophy   . Degenerative joint disease   . Gout   . Lumbar back pain   . Anxiety   . Nephrolithiasis     Past Surgical History  Procedure Laterality Date  . Coronary artery bypass graft  1979    x4  . Coronary artery bypass graft  1993    Redo x5 by Dr Gerkin  . Inguinal hernia repair  1994    Right by Dr Gerkin  . Inguinal hernia repair    1996    Left by Dr. Gerkin  . Decompressive laminectomy  01/2006    L2 - scarum by Dr. Aplington    History   Social History  . Marital Status: Married    Spouse Name: Audrey x 64 yrs    Number of Children: N/A  . Years of Education: N/A   Occupational History  . 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 Smoking status: Former Smoker    Quit date: 11/28/1944  . Smokeless tobacco: Never Used  . Alcohol Use: 7.0 oz/week    14 drink(s) per week  . Drug Use: Not on file  . Sexually Active: Not on file   Other Topics Concern  . Not on file   Social History Narrative   Married   7 children    ROS: no fevers or chills, productive cough, hemoptysis, dysphasia, odynophagia, melena, hematochezia, dysuria, hematuria, rash, seizure activity, orthopnea, PND, pedal edema, claudication. Remaining systems are negative.  Physical Exam: Well-developed well-nourished in no acute distress.  Skin is warm and dry.  HEENT  is normal.  Neck is supple.  Chest is clear to auscultation with normal expansion.  Cardiovascular exam is regular rate and rhythm.  Abdominal exam nontender or distended. No masses palpated. Extremities show no edema. neuro grossly intact  ECG sinus rhythm at a rate of 64. Left bundle branch block.     

## 2013-04-18 NOTE — Progress Notes (Signed)
TR BAND REMOVAL  LOCATION:    right radial  DEFLATED PER PROTOCOL:    yes  TIME BAND OFF / DRESSING APPLIED:    1600   SITE UPON ARRIVAL:    Level 1  SITE AFTER BAND REMOVAL:    Level 1  REVERSE ALLEN'S TEST:     positive  CIRCULATION SENSATION AND MOVEMENT:    Within Normal Limits   yes  COMMENTS:   Tolerated proceddure well, site above TR BAND site, soft, "puffy" looking , no hematoma, small bruise , good capilary refill , color WNL

## 2013-04-19 ENCOUNTER — Encounter (HOSPITAL_COMMUNITY): Payer: Self-pay | Admitting: Physician Assistant

## 2013-04-19 DIAGNOSIS — I25700 Atherosclerosis of coronary artery bypass graft(s), unspecified, with unstable angina pectoris: Secondary | ICD-10-CM | POA: Diagnosis present

## 2013-04-19 DIAGNOSIS — E7800 Pure hypercholesterolemia, unspecified: Secondary | ICD-10-CM | POA: Diagnosis not present

## 2013-04-19 DIAGNOSIS — I200 Unstable angina: Secondary | ICD-10-CM | POA: Diagnosis not present

## 2013-04-19 DIAGNOSIS — I25810 Atherosclerosis of coronary artery bypass graft(s) without angina pectoris: Secondary | ICD-10-CM | POA: Diagnosis not present

## 2013-04-19 DIAGNOSIS — Z79899 Other long term (current) drug therapy: Secondary | ICD-10-CM | POA: Diagnosis not present

## 2013-04-19 DIAGNOSIS — I2510 Atherosclerotic heart disease of native coronary artery without angina pectoris: Secondary | ICD-10-CM | POA: Diagnosis not present

## 2013-04-19 HISTORY — DX: Atherosclerosis of coronary artery bypass graft(s), unspecified, with unstable angina pectoris: I25.700

## 2013-04-19 LAB — BASIC METABOLIC PANEL
CO2: 25 mEq/L (ref 19–32)
Calcium: 8.8 mg/dL (ref 8.4–10.5)
Glucose, Bld: 96 mg/dL (ref 70–99)
Potassium: 3.6 mEq/L (ref 3.5–5.1)
Sodium: 138 mEq/L (ref 135–145)

## 2013-04-19 LAB — CBC
Hemoglobin: 13.6 g/dL (ref 13.0–17.0)
MCH: 32.9 pg (ref 26.0–34.0)
Platelets: 191 10*3/uL (ref 150–400)
RBC: 4.13 MIL/uL — ABNORMAL LOW (ref 4.22–5.81)
WBC: 6.2 10*3/uL (ref 4.0–10.5)

## 2013-04-19 NOTE — Progress Notes (Signed)
CARDIAC REHAB PHASE I   PRE:  Rate/Rhythm: 59 SB  BP:  Supine:   Sitting: 125/57  Standing:    SaO2:   MODE:  Ambulation: 1000 ft   POST:  Rate/Rhythm: 96 SR  BP:  Supine:   Sitting: 146/70  Standing:    SaO2:  6213-0865 Pt tolerated ambulation without c/o of cp or SOB. He did c/o of left knees feeling tender from previous injury and of some swelling in it. Pt was able to walk 1000 feet. VS stable. Completed stent discharge education with pt. He voices understanding. Pt agrees to Outpt. CRP in [place], will send referral.  Melina Copa RN 04/19/2013 9:51 AM

## 2013-04-19 NOTE — Discharge Summary (Signed)
CARDIOLOGY DISCHARGE SUMMARY   Patient ID: Daniel Reeves MRN: [id number] DOB/AGE: [birth year] 79 y.o.  Admit date: 04/18/2013 Discharge date: 04/19/2013  Primary Discharge Diagnosis:    Atherosclerosis of coronary artery bypass graft with unstable angina pectoris: Status post 3 5 x 12 mm Promus premier drug-eluting stent to the SVG-RCA  Secondary Discharge Diagnosis:    HYPERCHOLESTEROLEMIA  Procedures: PTCA and stenting of the saphenous vein graft to RCA, intravascular ultrasound of the saphenous vein graft RCA  Hospital Course: Daniel Reeves is a 79 y.o. male with a history of CAD. She had been having chest pain which progressed to the point that he was having some resting pain. He was seen in the office on 04/04/2013 and treated medically until he came in for On 04/12/2013. The cardiac catheterization showed concerning disease in the SVG to RCA. The films were reviewed by Dr. Riley Reeves and Dr. Excell Reeves as well as Dr. Gala Reeves. Percutaneous intervention was recommended and he came to the hospital for the procedure on 04/18/2013.  He had the procedures listed below and tolerated it well. An excellent result was obtained. He was held overnight.  On 04/19/2013, he was seen by Dr. Excell Reeves and by cardiac rehabilitation. His cath site was without bruit or hematoma. He was ambulating without chest pain or shortness of breath and considered stable for discharge, to follow up as an outpatient.  Labs:   Lab Results  Component Value Date   WBC 6.2 04/19/2013   HGB 13.6 04/19/2013   HCT 39.2 04/19/2013   MCV 94.9 04/19/2013   PLT 191 04/19/2013     Recent Labs Lab 04/19/13 0350  NA 138  K 3.6  CL 105  CO2 25  BUN 17  CREATININE 0.98  CALCIUM 8.8  GLUCOSE 96   Cardiac Cath:  04/18/2013 Lesion Data:  Vessel: Saphenous vein graft to RCA, distal body of graft  Percent stenosis (pre): 80  TIMI-flow (pre): 3  Stent: 3 5 x 12 mm Promus premier drug-eluting  Percent stenosis (post): 0    TIMI-flow (post): 3  Conclusions: Successful stenting of the saphenous vein graft to distal RCA utilizing a single drug-eluting stent.  Nonobstructive stenosis in the mid body of the graft at the anastomotic site of the first limb of the graft.  Recommendations: Dual antiplatelet therapy with aspirin and Plavix for at least 12 months.   EKG:  19-Apr-2013 04:56:35   Sinus bradycardia Left bundle branch block Abnormal ECG 9mm/s 37mm/mV 100Hz  8.0.1 12SL 241 HD CID: 1 Referred by: Daniel Reeves Unconfirmed Vent. rate 55 BPM PR interval 194 ms QRS duration 158 ms QT/QTc 486/464 ms P-R-T axes 57 -14 142  FOLLOW UP PLANS AND APPOINTMENTS Allergies  Allergen Reactions  . Lisinopril     REACTION: dizziness  . Methocarbamol     REACTION: pt states "dizzy"  . Pregabalin     REACTION: pt states "dizzy"  . Ramipril     REACTION: hives and dizziness     Medication List    TAKE these medications       allopurinol 300 MG tablet  Commonly known as:  ZYLOPRIM  Take 300 mg by mouth daily with breakfast.     amLODipine 5 MG tablet  Commonly known as:  NORVASC  Take 5 mg by mouth daily with breakfast.     aspirin EC 81 MG tablet  Take 81 mg by mouth every evening.     beta carotene w/minerals tablet  Take 1 tablet by mouth  daily with breakfast.     CITRUCEL oral powder  Generic drug:  methylcellulose  Take 1 packet by mouth daily with breakfast.     clopidogrel 75 MG tablet  Commonly known as:  PLAVIX  Take 75 mg by mouth daily with breakfast.     finasteride 5 MG tablet  Commonly known as:  PROSCAR  Take 5 mg by mouth See admin instructions. Take every day divisible by three.     folic acid 400 MCG tablet  Commonly known as:  FOLVITE  Take 400 mcg by mouth 2 (two) times daily.     losartan 50 MG tablet  Commonly known as:  COZAAR  Take 50 mg by mouth daily with breakfast.     mesalamine 1.2 G EC tablet  Commonly known as:  LIALDA  Take 1,200 mg by mouth See admin  instructions. Take every day divisible by two.     multivitamin with minerals Tabs  Take 1 tablet by mouth daily with breakfast.     nitroGLYCERIN 0.4 MG SL tablet  Commonly known as:  NITROSTAT  Place 1 tablet (0.4 mg total) under the tongue every 5 (five) minutes as needed. For chest pain.     Osteo Bi-Flex Adv Joint Shield Tabs  Take 1 tablet by mouth 2 (two) times daily.     simvastatin 40 MG tablet  Commonly known as:  ZOCOR  Take 40 mg by mouth every evening.     VITAMIN D-3 PO  Take 2,000 Units by mouth daily with breakfast.        Discharge Orders   Future Appointments Provider Department Dept Phone   04/29/2013 2:00 PM Daniel Macadamia, NP [facility] Main Office [place]) [phone]   08/28/2013 11:00 AM Daniel Mcalpine, MD  Pulmonary Care [phone]   Future Orders Complete By Expires     Amb Referral to Cardiac Rehabilitation  As directed     Comments:      Pt agrees to Outpt. CRP in [place],will send referral.    Diet - low sodium heart healthy  As directed     Increase activity slowly  As directed       Follow-up Information   Follow up with Daniel Fredrickson, NP On 04/29/2013. (See for Dr Daniel Reeves at 2:00 pm and then follow up with Dr Daniel Reeves in [facility])    Contact information:   [address] [place] [address] [phone]       BRING ALL MEDICATIONS WITH YOU TO FOLLOW UP APPOINTMENTS  Time spent with patient to include physician time: 36 min Signed: Theodore Demark, PA-C 04/19/2013, 12:26 PM Co-Sign MD

## 2013-04-19 NOTE — Progress Notes (Signed)
    Subjective:  No CP or dyspnea this am. Right forearm is sore.  Objective:  Vital Signs in the last 24 hours: Temp:  [97.6 F (36.4 C)-98 F (36.7 C)] 98 F (36.7 C) (05/23 0735) Pulse Rate:  [49-71] 62 (05/23 0735) Resp:  [16-18] 16 (05/23 0735) BP: (109-151)/(49-72) 139/72 mmHg (05/23 0735) SpO2:  [98 %-100 %] 98 % (05/23 0735) Weight:  [77.1 kg (169 lb 15.6 oz)] 77.1 kg (169 lb 15.6 oz) (05/23 0025)  Intake/Output from previous day: 05/22 0701 - 05/23 0700 In: 818.9 [P.O.:360; I.V.:458.9] Out: 900 [Urine:900]  Physical Exam: Pt is alert and oriented, NAD HEENT: normal Neck: JVP - normal Lungs: CTA bilaterally CV: RRR without murmur or gallop Abd: soft, NT, Positive BS, no hepatomegaly Ext: no C/C/E, distal pulses intact and equal, small hematoma right forearm Skin: warm/dry no rash   Lab Results:  Recent Labs  04/18/13 0730 04/19/13 0350  WBC 6.5 6.2  HGB 13.6 13.6  PLT 189 191    Recent Labs  04/18/13 0730 04/19/13 0350  NA 137 138  K 5.1 3.6  CL 103 105  CO2 23 25  GLUCOSE 105* 96  BUN 23 17  CREATININE 1.04 0.98   No results found for this basename: TROPONINI, CK, MB,  in the last 72 hours  Assessment/Plan:  CAD, CCS class 3 angina. S/P IVUS and stenting of the SVG-sequenced to acute marginal and PDA. Treated with DES. Plan ASA and plavix at least 12 months. Otherwise continue home med Rx. F/U PA/NP 2 weeks. I'll be assuming care from Dr Riley Kill.  [name], M.D. 04/19/2013, 8:37 AM

## 2013-04-29 ENCOUNTER — Ambulatory Visit (INDEPENDENT_AMBULATORY_CARE_PROVIDER_SITE_OTHER): Payer: Medicare Other | Admitting: Nurse Practitioner

## 2013-04-29 ENCOUNTER — Encounter: Payer: Self-pay | Admitting: Nurse Practitioner

## 2013-04-29 ENCOUNTER — Telehealth: Payer: Self-pay | Admitting: Cardiovascular Disease

## 2013-04-29 VITALS — BP 118/60 | HR 60 | Ht 72.0 in | Wt 174.8 lb

## 2013-04-29 DIAGNOSIS — Z9861 Coronary angioplasty status: Secondary | ICD-10-CM

## 2013-04-29 DIAGNOSIS — I2589 Other forms of chronic ischemic heart disease: Secondary | ICD-10-CM

## 2013-04-29 DIAGNOSIS — I255 Ischemic cardiomyopathy: Secondary | ICD-10-CM

## 2013-04-29 DIAGNOSIS — I2510 Atherosclerotic heart disease of native coronary artery without angina pectoris: Secondary | ICD-10-CM | POA: Diagnosis not present

## 2013-04-29 DIAGNOSIS — Z955 Presence of coronary angioplasty implant and graft: Secondary | ICD-10-CM

## 2013-04-29 LAB — CBC WITH DIFFERENTIAL/PLATELET
Basophils Absolute: 0 10*3/uL (ref 0.0–0.1)
Basophils Relative: 0.5 % (ref 0.0–3.0)
Eosinophils Absolute: 0.2 10*3/uL (ref 0.0–0.7)
Eosinophils Relative: 2.4 % (ref 0.0–5.0)
HCT: 38.7 % — ABNORMAL LOW (ref 39.0–52.0)
Hemoglobin: 13.3 g/dL (ref 13.0–17.0)
Lymphocytes Relative: 23.2 % (ref 12.0–46.0)
Lymphs Abs: 1.7 10*3/uL (ref 0.7–4.0)
MCHC: 34.2 g/dL (ref 30.0–36.0)
MCV: 96.8 fl (ref 78.0–100.0)
Monocytes Absolute: 0.6 10*3/uL (ref 0.1–1.0)
Monocytes Relative: 7.9 % (ref 3.0–12.0)
Neutro Abs: 4.8 10*3/uL (ref 1.4–7.7)
Neutrophils Relative %: 66 % (ref 43.0–77.0)
Platelets: 222 10*3/uL (ref 150.0–400.0)
RBC: 4 Mil/uL — ABNORMAL LOW (ref 4.22–5.81)
RDW: 14 % (ref 11.5–14.6)
WBC: 7.3 10*3/uL (ref 4.5–10.5)

## 2013-04-29 LAB — BASIC METABOLIC PANEL
BUN: 22 mg/dL (ref 6–23)
CO2: 24 mEq/L (ref 19–32)
Calcium: 9.3 mg/dL (ref 8.4–10.5)
Chloride: 106 mEq/L (ref 96–112)
Creatinine, Ser: 1.1 mg/dL (ref 0.4–1.5)
GFR: 68.73 mL/min (ref >60.00)
Glucose, Bld: 112 mg/dL — ABNORMAL HIGH (ref 70–99)
Potassium: 3.9 mEq/L (ref 3.5–5.1)
Sodium: 138 mEq/L (ref 135–145)

## 2013-04-29 MED ORDER — LOSARTAN POTASSIUM 50 MG PO TABS
50.0000 mg | ORAL_TABLET | Freq: Every day | ORAL | Status: DC
Start: 2013-04-29 — End: 2013-06-05

## 2013-04-29 NOTE — Telephone Encounter (Signed)
I spoke with Daniel Reeves and the pt is only scheduled for a routine dental cleaning tomorrow.  I made her aware that since the pt had a recent stent placed he should reschedule cleaning for at least 6 weeks from 04/18/13. The pt does not require SBE in the future.

## 2013-04-29 NOTE — Telephone Encounter (Signed)
New problem   Pt is having dental cleaning tomorrow 04/30/13 and office need to know because of pt's stent placement if pt need to take antiobiotic prior to his cleaning. Please call Dr Bradly Bienenstock office and they also need a fax sent to [phone].

## 2013-04-29 NOTE — Patient Instructions (Addendum)
Continue with your current medicines except STOP the Amlodipine. INCREASE your losartan to two times a day.  Monitor your blood pressure at home and keep a record  Minimize your salt  We will check labs today.   See Dr. Jens Som in 4 weeks  Call the [facility] office at [phone] if you have any questions, problems or concerns.

## 2013-04-29 NOTE — Progress Notes (Signed)
Daniel Reeves Date of Birth: 01/02/1924 Medical Record #[id number]  History of Present Illness: Daniel Reeves is seen back today for a post hospital visit. Seen for Dr. Jens Som. Former patient of Dr. Rosalyn Charters. Has HLD and CAD with prior CABG in 1979 and redo in 1993.   Patient had coronary artery bypassing graft in 1979 with a saphenous vein graft to the diagonal and LAD. He also had a saphenous vein graft to the obtuse marginal and PDA. In November of 1993 the patient had redo coronary artery bypass and graft with a LIMA sequentially to the LAD and diagonal, saphenous vein graft to the obtuse marginal and a sequential saphenous vein graft to the acute marginal and posterior lateral.  Other issues include gout, HTN, HLD, BPH and OA. Chronic LBBB and 1st degree AV block.   Most recently admitted for cath due to angina that had progressed to spells at rest. Has had DES to the SVG to the RCA. He is on Plavix. His EF is reduced at 25% per recent cath.   Comes back today. He is here with his wife. He is doing well. No more chest pain. Not short of breath. Some swelling in his left leg but feels this is more related to a fall that he had a couple of weeks ago. Going to see Dr. Leslee Home. No more falls. Ok on his medicines. Not dizzy or lightheaded. Does not check his BP. His right arm is bruised but it is soft and slowly resolving. They are planning on going to [place] in 2 weeks and will be flying.    Current Outpatient Prescriptions on File Prior to Visit  Medication Sig Dispense Refill  . allopurinol (ZYLOPRIM) 300 MG tablet Take 300 mg by mouth daily with breakfast.      . amLODipine (NORVASC) 5 MG tablet Take 5 mg by mouth daily with breakfast.      . aspirin EC 81 MG tablet Take 81 mg by mouth every evening.      . beta carotene w/minerals (OCUVITE) tablet Take 1 tablet by mouth daily with breakfast.       . Cholecalciferol (VITAMIN D-3 PO) Take 2,000 Units by mouth daily with breakfast.       .  clopidogrel (PLAVIX) 75 MG tablet Take 75 mg by mouth daily with breakfast.      . finasteride (PROSCAR) 5 MG tablet Take 5 mg by mouth See admin instructions. Take every day divisible by three.      . folic acid (FOLVITE) 400 MCG tablet Take 400 mcg by mouth 2 (two) times daily.       [name] losartan (COZAAR) 50 MG tablet Take 50 mg by mouth daily with breakfast.      . mesalamine (LIALDA) 1.2 G EC tablet Take 1,200 mg by mouth See admin instructions. Take every day divisible by two.      . methylcellulose (CITRUCEL) oral powder Take 1 packet by mouth daily with breakfast.      . Misc Natural Products (OSTEO BI-FLEX ADV JOINT SHIELD) TABS Take 1 tablet by mouth 2 (two) times daily.        . Multiple Vitamin (MULITIVITAMIN WITH MINERALS) TABS Take 1 tablet by mouth daily with breakfast.       . nitroGLYCERIN (NITROSTAT) 0.4 MG SL tablet Place 1 tablet (0.4 mg total) under the tongue every 5 (five) minutes as needed. For chest pain.  25 tablet  3  . simvastatin (ZOCOR) 40 MG tablet Take  40 mg by mouth every evening.       No current facility-administered medications on file prior to visit.    Allergies  Allergen Reactions  . Lisinopril     REACTION: dizziness  . Methocarbamol     REACTION: pt states "dizzy"  . Pregabalin     REACTION: pt states "dizzy"  . Ramipril     REACTION: hives and dizziness    Past Medical History  Diagnosis Date  . Cerumen impaction     Bilateral  . Hypertension   . CAD (coronary artery disease)   . Peripheral vascular disease   . Abdominal aortic aneurysm   . Hypercholesterolemia   . GERD (gastroesophageal reflux disease)   . Diverticulosis of colon   . Amebic dysentery   . Benign prostatic hypertrophy   . Degenerative joint disease   . Gout   . Lumbar back pain   . Anxiety   . Nephrolithiasis   . Heart murmur     Past Surgical History  Procedure Laterality Date  . Coronary artery bypass graft  1979    x4 SVG-DIAG-LAD, SVG-OM-PDA  . Coronary  artery bypass graft  1993    Redo x5 by Dr Gerrit Friends; Pollyann Glen, SVG-OM, SVG-AM-PL  . Inguinal hernia repair  1994    Right by Dr Gerrit Friends  . Inguinal hernia repair  1996    Left by Dr. Gerrit Friends  . Decompressive laminectomy  01/2006    L2 - scarum by Dr. Simonne Come  . Coronary angioplasty with stent placement  04/18/2013    RCA         History  Smoking status  . Former Smoker  . Quit date: 11/28/1944  Smokeless tobacco  . Never Used    History  Alcohol Use  . 7.0 oz/week  . 14 drink(s) per week    Comment: daily rum  or wine    Family History  Problem Relation Age of Onset  . Parkinsonism Brother     Review of Systems: The review of systems is per the HPI.  All other systems were reviewed and are negative.  Physical Exam: BP 118/60  Pulse 60  Ht 6' (1.829 m)  Wt 174 lb 12.8 oz (79.289 kg)  BMI 23.7 kg/m2 Patient is very pleasant and in no acute distress. Looks younger than his stated age. Skin is warm and dry. Color is normal.  HEENT is unremarkable. Normocephalic/atraumatic. PERRL. Sclera are nonicteric. Neck is supple. No masses. No JVD. Lungs are clear. Cardiac exam shows a regular rate and rhythm. Abdomen is soft. Extremities are without edema. Gait and ROM are intact. No gross neurologic deficits noted.  LABORATORY DATA: BMET and CBC pending  Lab Results  Component Value Date   WBC 6.2 04/19/2013   HGB 13.6 04/19/2013   HCT 39.2 04/19/2013   PLT 191 04/19/2013   GLUCOSE 96 04/19/2013   CHOL 112 03/19/2013   TRIG 66.0 03/19/2013   HDL 37.40* 03/19/2013   LDLCALC 61 03/19/2013   ALT 26 03/19/2013   AST 30 03/19/2013   NA 138 04/19/2013   K 3.6 04/19/2013   CL 105 04/19/2013   CREATININE 0.98 04/19/2013   BUN 17 04/19/2013   CO2 25 04/19/2013   TSH 2.34 03/19/2013   PSA 4.29* 02/08/2011   INR 1.1* 04/04/2013   Coronary Angiogram: Left main: Subtotally occlude distally. Gives off tiny Ramus branch.  LAD: Occluded ostially  LCX: Occluded ostially  RCA: Occluded  proximally  Old grafts not found. Presume  occluded.  LIMA - DIAG - LAD: Patent. Distal LAD occluded after insertion of LIMA  SVG - OM: Occluded  SVG - AM -PDA: Patent. 50% focal lesion in graft after insertion to acute marginal. High grade hazy lesion in distal great just prior to insertion into PDA. Likely 80-90% stenosis. The distal RCA system is very large and feeds collaterals to the LCX system.  LV-gram done in the RAO projection: Ejection fraction = 25% Global HK   Assessment:  1. Severe native 3VCAD  2. SVG-OM: occluded  3. LIMA-DIAG-LAD: patent  4. High grade lesion in SVG-PDA.  5. EF 25%   Plan/Discussion:  The lesion in SVG to right system appears high grade and somewhat thromobotic. I have reviewed with Drs. Riley Kill and [place] who agree that lesion is likely significant and should be addressed , if possible. Given the graft is 77 years old I had along talk with Mr. Stogdill and his family about the risks of intervention vs medical therapy. Including graft rupture and death. They would like to proceed. Will load Plavix. Schedule PCI with Dr. Excell Seltzer next week.  Daniel Bensimhon,MD  10:47 AM    PCI Procedure: Lesion Data:  Vessel: Saphenous vein graft to RCA, distal body of graft  Percent stenosis (pre): 80  TIMI-flow (pre): 3  Stent: 3 5 x 12 mm Promus premier drug-eluting  Percent stenosis (post): 0  TIMI-flow (post): 3  Conclusions: Successful stenting of the saphenous vein graft to distal RCA utilizing a single drug-eluting stent.  Nonobstructive stenosis in the mid body of the graft at the anastomotic site of the first limb of the graft.  Recommendations: Dual antiplatelet therapy with aspirin and Plavix for at least 12 months.  [name]  04/18/2013, 12:05 PM    Assessment / Plan: 1. CAD with recent unstable angina - s/p cath and subsequent staged PCI with DES to the SVG to the RCA. He is doing well clinically with no recurrent angina. Check follow up lab today. See  him back in 4 weeks in the [facility] office.   2. LV dysfunction - EF is 25% - he is asymptomatic. I have talked with Dr. Jens Som. We will stop the Norvasc. Increase the Losartan to 50 mg BID. We have talked about the need for salt restriction and daily weights - which he is doing. He is not a good candidate for beta blocker therapy due to LBBB and 1st degree AV block. He will monitor his BP at home.   3. HTN - BP is good. I have asked him to monitor at home.   Patient is agreeable to this plan and will call if any problems develop in the interim.   Rosalio Macadamia, RN, ANP-C [facility] [address] [place], [place]  [address]

## 2013-04-29 NOTE — Addendum Note (Signed)
Addended by: Tonita Phoenix on: 04/29/2013 02:52 PM   Modules accepted: Orders

## 2013-05-02 ENCOUNTER — Other Ambulatory Visit (HOSPITAL_COMMUNITY): Payer: Self-pay

## 2013-05-02 MED ORDER — CLOPIDOGREL BISULFATE 75 MG PO TABS: 75.0000 mg | ORAL_TABLET | Freq: Every day | ORAL | Status: DC

## 2013-05-02 NOTE — Telephone Encounter (Signed)
..   Requested Prescriptions   Signed Prescriptions Disp Refills  . clopidogrel (PLAVIX) 75 MG tablet 30 tablet 6    Sig: Take 1 tablet (75 mg total) by mouth daily with breakfast.    Authorizing Provider: Lewayne Bunting    Ordering User: Christella Hartigan, Susie Ehresman Judie Petit

## 2013-05-14 ENCOUNTER — Other Ambulatory Visit: Payer: Self-pay | Admitting: Pulmonary Disease

## 2013-05-14 MED ORDER — SIMVASTATIN 40 MG PO TABS: 40.0000 mg | ORAL_TABLET | Freq: Every evening | ORAL | Status: DC

## 2013-05-14 NOTE — Telephone Encounter (Signed)
Received faxed refill request from [facility] for simvastatin 40mg  Last ov w/ SN 4.22.14, follow up in 6 months Refills sent Will sign off

## 2013-05-21 DIAGNOSIS — M1710 Unilateral primary osteoarthritis, unspecified knee: Secondary | ICD-10-CM | POA: Diagnosis not present

## 2013-06-05 ENCOUNTER — Ambulatory Visit (INDEPENDENT_AMBULATORY_CARE_PROVIDER_SITE_OTHER): Payer: Medicare Other | Admitting: Cardiology

## 2013-06-05 ENCOUNTER — Encounter: Payer: Self-pay | Admitting: Cardiology

## 2013-06-05 VITALS — BP 120/70 | HR 60 | Wt 171.0 lb

## 2013-06-05 DIAGNOSIS — I2510 Atherosclerotic heart disease of native coronary artery without angina pectoris: Secondary | ICD-10-CM | POA: Diagnosis not present

## 2013-06-05 DIAGNOSIS — I255 Ischemic cardiomyopathy: Secondary | ICD-10-CM | POA: Insufficient documentation

## 2013-06-05 DIAGNOSIS — I10 Essential (primary) hypertension: Secondary | ICD-10-CM | POA: Diagnosis not present

## 2013-06-05 DIAGNOSIS — I714 Abdominal aortic aneurysm, without rupture: Secondary | ICD-10-CM | POA: Diagnosis not present

## 2013-06-05 DIAGNOSIS — Z955 Presence of coronary angioplasty implant and graft: Secondary | ICD-10-CM

## 2013-06-05 DIAGNOSIS — E7800 Pure hypercholesterolemia, unspecified: Secondary | ICD-10-CM | POA: Diagnosis not present

## 2013-06-05 DIAGNOSIS — Z9861 Coronary angioplasty status: Secondary | ICD-10-CM

## 2013-06-05 DIAGNOSIS — I2589 Other forms of chronic ischemic heart disease: Secondary | ICD-10-CM

## 2013-06-05 HISTORY — DX: Ischemic cardiomyopathy: I25.5

## 2013-06-05 MED ORDER — LOSARTAN POTASSIUM 50 MG PO TABS: 50.0000 mg | ORAL_TABLET | Freq: Two times a day (BID) | ORAL | Status: DC

## 2013-06-05 NOTE — Assessment & Plan Note (Signed)
Blood pressure controlled. Continue present medications. 

## 2013-06-05 NOTE — Progress Notes (Signed)
HPI: Pleasant male for followup of coronary artery disease. Patient has had prior coronary artery bypass and graft in 1979 and 1993. Abdominal ultrasound in January of 2014 showed an aneurysm of 3.3 x 3.4 cm. Followup recommended in one year. Patient underwent cardiac catheterization in May of 2014. The left main, LAD, circumflex and RCA were occluded. The LIMA to the LAD was patent and the distal LAD was occluded after the insertion. Saphenous vein graft to the obtuse marginal was occluded. Saphenous vein graft to the acute marginal and PDA had a high-grade lesion prior to insertion into the PDA of 80-90%. Ejection fraction was 25%. Patient had PCI of the saphenous vein graft to the right coronary artery with a drug-eluting stent. Since that time, the patient denies any dyspnea on exertion, orthopnea, PND, pedal edema, palpitations, syncope or chest pain.    Current Outpatient Prescriptions  Medication Sig Dispense Refill  . allopurinol (ZYLOPRIM) 300 MG tablet Take 300 mg by mouth daily with breakfast.      . aspirin EC 81 MG tablet Take 81 mg by mouth every evening.      . beta carotene w/minerals (OCUVITE) tablet Take 1 tablet by mouth daily with breakfast.       . Cholecalciferol (VITAMIN D-3 PO) Take 2,000 Units by mouth daily with breakfast.       . clopidogrel (PLAVIX) 75 MG tablet Take 1 tablet (75 mg total) by mouth daily with breakfast.  30 tablet  6  . finasteride (PROSCAR) 5 MG tablet See admin instructions. As directed      . folic acid (FOLVITE) 400 MCG tablet Take 400 mcg by mouth 2 (two) times daily.       [name] losartan (COZAAR) 50 MG tablet Take 1 tablet (50 mg total) by mouth daily with breakfast.  60 tablet  6  . mesalamine (LIALDA) 1.2 G EC tablet Take 1,200 mg by mouth See admin instructions. As directed      . methylcellulose (CITRUCEL) oral powder Take 1 packet by mouth daily with breakfast.      . Misc Natural Products (OSTEO BI-FLEX ADV JOINT SHIELD) TABS Take 1 tablet by  mouth 2 (two) times daily.        . Multiple Vitamin (MULITIVITAMIN WITH MINERALS) TABS Take 1 tablet by mouth daily with breakfast.       . nitroGLYCERIN (NITROSTAT) 0.4 MG SL tablet Place 1 tablet (0.4 mg total) under the tongue every 5 (five) minutes as needed. For chest pain.  25 tablet  3  . simvastatin (ZOCOR) 40 MG tablet Take 1 tablet (40 mg total) by mouth every evening.  90 tablet  3   No current facility-administered medications for this visit.     Past Medical History  Diagnosis Date  . Cerumen impaction     Bilateral  . Hypertension   . CAD (coronary artery disease)   . Peripheral vascular disease   . Abdominal aortic aneurysm   . Hypercholesterolemia   . GERD (gastroesophageal reflux disease)   . Diverticulosis of colon   . Amebic dysentery   . Benign prostatic hypertrophy   . Degenerative joint disease   . Gout   . Lumbar back pain   . Anxiety   . Nephrolithiasis   . Heart murmur     Past Surgical History  Procedure Laterality Date  . Coronary artery bypass graft  1979    x4 SVG-DIAG-LAD, SVG-OM-PDA  . Coronary artery bypass graft  1993  Redo x5 by Dr Gerrit Friends; Pollyann Glen, SVG-OM, SVG-AM-PL  . Inguinal hernia repair  1994    Right by Dr Gerrit Friends  . Inguinal hernia repair  1996    Left by Dr. Gerrit Friends  . Decompressive laminectomy  01/2006    L2 - scarum by Dr. Simonne Come  . Coronary angioplasty with stent placement  04/18/2013    RCA         History   Social History  . Marital Status: Married    Spouse Name: Magda Paganini x 64 yrs    Number of Children: N/A  . Years of Education: N/A   Occupational History  . 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 Smoking status: Former Smoker    Quit date: 11/28/1944  . Smokeless tobacco: Never Used  . Alcohol Use: 7.0 oz/week    14 drink(s) per week     Comment: daily rum  or wine  . Drug Use: No  . Sexually Active: Not Currently   Other Topics Concern  . Not on file    Social History Narrative   Married   7 children    ROS: no fevers or chills, productive cough, hemoptysis, dysphasia, odynophagia, melena, hematochezia, dysuria, hematuria, rash, seizure activity, orthopnea, PND, pedal edema, claudication. Remaining systems are negative.  Physical Exam: Well-developed well-nourished in no acute distress.  Skin is warm and dry.  HEENT is normal.  Neck is supple.  Chest is clear to auscultation with normal expansion.  Cardiovascular exam is regular rate and rhythm.  Abdominal exam nontender or distended. No masses palpated. Extremities show no edema. neuro grossly intact

## 2013-06-05 NOTE — Assessment & Plan Note (Signed)
Continue statin. 

## 2013-06-05 NOTE — Patient Instructions (Addendum)
Your physician wants you to follow-up in: 6 MONTHS WITH DR Jens Som You will receive a reminder letter in the mail two months in advance. If you don't receive a letter, please call our office to schedule the follow-up appointment.   Your physician has requested that you have an echocardiogram. Echocardiography is a painless test that uses sound waves to create images of your heart. It provides your doctor with information about the size and shape of your heart and how well your heart's chambers and valves are working. This procedure takes approximately one hour. There are no restrictions for this procedure. IN 3 MONTHS  CALL ME WITH THE [facility]

## 2013-06-05 NOTE — Assessment & Plan Note (Signed)
   Continue aspirin and Plavix 

## 2013-06-05 NOTE — Assessment & Plan Note (Signed)
Plan followup abdominal ultrasound January 2015.

## 2013-06-05 NOTE — Assessment & Plan Note (Addendum)
Continue ARB. I have not added a beta blocker as his last electrocardiogram showed a PR interval of 26, heart rate 58 and left bundle branch block. Repeat echocardiogram in 3 months. I doubt we would consider ICD given age.

## 2013-06-05 NOTE — Addendum Note (Signed)
Addended by: [name] on: 06/05/2013 11:05 AM   Modules accepted: Orders

## 2013-06-26 ENCOUNTER — Ambulatory Visit: Payer: Medicare Other | Admitting: Cardiology

## 2013-07-03 ENCOUNTER — Other Ambulatory Visit: Payer: Self-pay

## 2013-07-08 ENCOUNTER — Telehealth: Payer: Self-pay | Admitting: Pulmonary Disease

## 2013-07-08 NOTE — Telephone Encounter (Signed)
I spoke with pt. He is requesting a PNA vaccine. Pt stated he has never had one. Also he stated Earleen Reaper is stating he is overdue for tetanus vaccine. He does not remember when this was last done. He does believe this was over 10 years ago. Please advise SN if okay to bring pt in for this thanks Last OV 03/19/13 Pending 08/28/13

## 2013-07-09 NOTE — Telephone Encounter (Signed)
Pt returned call. Daniel Reeves °

## 2013-07-09 NOTE — Telephone Encounter (Signed)
Per SN: okay for PNA/TDAP vaccine-can bring pt in for this on injection scheduled.  LMTCB x1 w/ spouse

## 2013-07-09 NOTE — Telephone Encounter (Signed)
Pt is aware that he can come by and get these immunizations. He has been added to the injection schedule for Thursday per his request.

## 2013-07-11 ENCOUNTER — Ambulatory Visit (INDEPENDENT_AMBULATORY_CARE_PROVIDER_SITE_OTHER): Payer: Medicare Other

## 2013-07-11 DIAGNOSIS — Z23 Encounter for immunization: Secondary | ICD-10-CM | POA: Diagnosis not present

## 2013-07-16 ENCOUNTER — Other Ambulatory Visit: Payer: Self-pay | Admitting: *Deleted

## 2013-07-16 MED ORDER — CLOPIDOGREL BISULFATE 75 MG PO TABS: 75.0000 mg | ORAL_TABLET | Freq: Every day | ORAL | Status: DC

## 2013-07-19 DIAGNOSIS — H353190 Nonexudative age-related macular degeneration, unspecified eye, stage unspecified: Secondary | ICD-10-CM | POA: Diagnosis not present

## 2013-08-23 ENCOUNTER — Other Ambulatory Visit: Payer: Self-pay

## 2013-08-23 DIAGNOSIS — I255 Ischemic cardiomyopathy: Secondary | ICD-10-CM

## 2013-08-23 DIAGNOSIS — I2510 Atherosclerotic heart disease of native coronary artery without angina pectoris: Secondary | ICD-10-CM

## 2013-08-23 DIAGNOSIS — Z955 Presence of coronary angioplasty implant and graft: Secondary | ICD-10-CM

## 2013-08-23 MED ORDER — LOSARTAN POTASSIUM 50 MG PO TABS: 50.0000 mg | ORAL_TABLET | Freq: Two times a day (BID) | ORAL | Status: DC

## 2013-08-28 ENCOUNTER — Ambulatory Visit (INDEPENDENT_AMBULATORY_CARE_PROVIDER_SITE_OTHER): Payer: Medicare Other | Admitting: Pulmonary Disease

## 2013-08-28 ENCOUNTER — Encounter: Payer: Self-pay | Admitting: Pulmonary Disease

## 2013-08-28 VITALS — BP 112/72 | HR 56 | Temp 98.3°F | Ht 72.0 in | Wt 170.8 lb

## 2013-08-28 DIAGNOSIS — I739 Peripheral vascular disease, unspecified: Secondary | ICD-10-CM

## 2013-08-28 DIAGNOSIS — M1990 Unspecified osteoarthritis, unspecified site: Secondary | ICD-10-CM

## 2013-08-28 DIAGNOSIS — N200 Calculus of kidney: Secondary | ICD-10-CM

## 2013-08-28 DIAGNOSIS — I2510 Atherosclerotic heart disease of native coronary artery without angina pectoris: Secondary | ICD-10-CM | POA: Diagnosis not present

## 2013-08-28 DIAGNOSIS — I714 Abdominal aortic aneurysm, without rupture, unspecified: Secondary | ICD-10-CM

## 2013-08-28 DIAGNOSIS — K219 Gastro-esophageal reflux disease without esophagitis: Secondary | ICD-10-CM

## 2013-08-28 DIAGNOSIS — M545 Low back pain, unspecified: Secondary | ICD-10-CM

## 2013-08-28 DIAGNOSIS — D126 Benign neoplasm of colon, unspecified: Secondary | ICD-10-CM

## 2013-08-28 DIAGNOSIS — K5730 Diverticulosis of large intestine without perforation or abscess without bleeding: Secondary | ICD-10-CM

## 2013-08-28 DIAGNOSIS — F411 Generalized anxiety disorder: Secondary | ICD-10-CM

## 2013-08-28 DIAGNOSIS — I10 Essential (primary) hypertension: Secondary | ICD-10-CM

## 2013-08-28 NOTE — Patient Instructions (Addendum)
Today we updated your med list in our EPIC system...    Continue your current medications the same...  Call for any questions...  Let's plan a follow up visit in 6mo, sooner if needed for problems...   

## 2013-08-28 NOTE — Progress Notes (Signed)
Subjective:    Patient ID: Daniel Reeves, male    DOB: [birth year], 79 y.o.   MRN: [id number]  HPI 79 y/o WM here for a follow up visit... he has multiple medical problems including HBP;  CAD followed by [name];  ASPVD w/ 3cm AAA that has been stable;  Hyperchol;  GERD;  Divertics/ Polyps/ ?fecal incontinence;  Hx amebic dysentery yrs ago;  BPH/ BOO/ elev PSA on Avodart & followed by [name];  DJD/ Gout/ hx LBP w/ surg 2007 by [name]...  ~  August 17, 2011:  11mo ROV & he reports doing satis, no new complaints or concerns...    Cardiac> HBP, CAD> followed by [name] on ASA, Losartan; s/p CABG 79 & 93; BP= 132/68 today, seen 6/12 & note reviewed- doing satis w/o CP, palpit, dizzy, ch in SOB, edema, etc...    ASPVS> known 3cm AAA being followed; he had CTAbd done via ER 4/12= +right kid stone w/ hydroneph; AAA measured 3.2cm, infrarenal, & 3.4cm in length.    CHOL> on Simva40 & FLP 6/12 looked good x sl low HDL (rec incr exercise)...    GI> Gerd, Divertics, Polyps, Hx amebic dysentery> followed byDrJacobs on Lialda- pt decr to 1Qod now & stable BMs...    BPH> followed by [name] who switched off Avodart, on Proscar now but only taking every 3rd day?; denies voiding issues, nocturia diminished to 1-2...    DJD, Gout, LBP> stable on Allopurinol, Osteobiflex, Tylenol...  ~  February 15, 2012:  11mo ROV & Daniel Reeves's CC is fatigue, tired all the time, no energy- yet he looks fine, exam is neg & labs unrevealing; asked to gradually incr his exercise program to build his stamina, consider Silver Sneakers, etc...     Cardiac> HBP, CAD> followed by [name] on ASA, Losartan; s/p CABG 79 & 93; BP= 130/70 today; seen 6/12 & note reviewed- doing satis w/o CP, syncope, ch in SOB, edema, etc; he notes occas palpit & will be due for a yearly check up by [name] soon...    ASPVS> known 3cm AAA being followed; he had CTAbd done via ER 4/12= +right kid stone w/ hydroneph; AAA measured 3.2cm, infrarenal, &  3.4cm in length; f/u AbdSonar 1/13 w/ AAA measuring 3cm, no change (f/u rec 32yr).    CHOL> on Simva40 & FLP showed TChol 97, TG 72, HDL 40, LDL 43    GI> Gerd, Divertics, Polyps, Hx amebic dysentery> followed byDrJacobs on Lialda- pt decr to 1Qod now & stable BMs...    BPH> followed by [name] (he does PSA) & switched off Avodart, on Proscar now but only taking every 3rd day?; denies voiding issues, nocturia diminished to 1-2...    DJD, Gout, LBP> stable on Allopurinol, Osteobiflex, Tylenol... LABS 3/13:  FLP- at goals on Simva40;  Chems- wnl;  CBC- wnl;  TSH=2.67;  BNP=247 CXR 4/13 showed prev median sternotomy & CABG, norm heart size, clear lungs, NAD...  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    [name] did FLP 6/13 on Simva40 w/ TChol 103, TG 57, HDL 43, LDL 49  He continues on Lialda 1.2gm Qod for his diarrhea & well regulated on this...    He saw [name] for Urology f/u> doing satis on Proscar5, DRE was neg, no PSA done due to age... We reviewed prob list, meds, xrays and labs> see below for updates >> he declines the Flu vaccine... EKG 6/13 by [name] showed SBrady, rate58, LAD, LBBB, NAD.[name].   ~  March 19, 2013:  768mo ROV & Daniel Reeves states he is doing well, casualy mentioned some intermittent CP "tightness" & last episode on plane returning from a trip; as noted he's had 2 prev CABG surgeries & we will set him up to see Aletta Edouard now that [name] has retired... We reviewed the following medical problems during today's office visit >>     Cardiac> HBP, CAD> followed by [name]/Crenshaw on ASA81, Losartan50; s/p CABG  79 & 93; BP= 122/64 today & notes intermittent chest tightness & SOB, no palpit or edema; will will refer to Cards- ?cath to check grafts...    ASPVD> known 3cm AAA being followed; he had CTAbd done via ER 4/12= +right kid stone w/ hydroneph; AAA measured 3.2cm, infrarenal, & 3.4cm in length; f/u AbdSonar 1/14 w/ infrarenal saccular AAA measuring 3.4cm, no change (f/u rec 60yr).    CHOL> on Simva40 & FLP 4/14 showed TChol 112, TG 66, HDL 37, LDL 61    GI> Gerd, Divertics, Polyps, Hx amebic dysentery> followed byDrJacobs on Lialda- pt decr to 1Qod now & stable BMs...    BPH> followed by [name] (he does PSA) & switched off Avodart, on Proscar5 now but only taking every 3rd day?; denies voiding issues, nocturia diminished to 1-2...    DJD, Gout, LBP> stable on Allopurinol300, Osteobiflex, Tylenol... We reviewed prob list, meds, xrays and labs> see below for updates >>  LABS 4/14:  FLP- at goals on Simva40;  Chems- wnl;  CBC- wnl;  TSH=2.34;  VitD=45...  ~  August 28, 2013  768mo ROV & Daniel Reeves has had a good interval- no new complaints or concerns;  He brought a list of his BP checks at home- all look good in the 130/70 range;  He was adm 5/22 - 04/19/13 by Cards for CP/ angina> cath showed an 80% stenosis in the SVG to the RCA, he had an angioplasty & drug eluting stent placed, and was placed on dual antiplat therapy w/ ASA/ Plavix...  We reviewed the following medical problems during today's office visit >>     Cardiac> HBP, CAD> followed by [name]/Crenshaw on ASA81/Plavix75, Losartan50; s/p CABG 79 & 93; BP= 112/72 today & prev chest tightness resolved after stent to SVG-RCA 5/14 as above; they are doing a bif family reunion & another cruise soon...    ASPVD> known 3.4cm AAA being followed; he had CTAbd done via ER 4/12= +right kid stone w/ hydroneph; AAA measured 3.2cm, infrarenal, & 3.4cm in length; f/u AbdSonar 1/14 w/ infrarenal saccular AAA measuring 3.4cm, no change (f/u rec 64yr).    CHOL> on  Simva40 & FLP 4/14 showed TChol 112, TG 66, HDL 37, LDL 61; Continue same.    GI> Gerd, Divertics, Polyps, Hx amebic dysentery> followed byDrJacobs on Lialda- pt decr to 1Qod now & stable BMs; doesn't want to take more...    BPH> on Finasteride5 (taking Q3rdDay); followed by [name] for BPH, BOO, elev PSA which was as hi as 11.7 but dropped to 3.69 in 2012 & they decided to forego further testing; last seen 10/13 & plans yearly check up.Marland KitchenMarland Kitchen  DJD, Gout, LBP> stable on Allopurinol300, Osteobiflex, Tylenol... We reviewed prob list, meds, xrays and labs> see below for updates >>  CXR 5/14 showed stable heart size, s/p CABG, clear lungs, NAD.Marland KitchenMarland Kitchen EKG 5/14 showed SBrady, rate57, 1st degree AVB, LBBB/ poor R progression... LABS May-June2014> Chems- ok;  CBC- wnl...            Problem List:  HYPERTENSION (ICD-401.9) - on LOSARTAN 50mg /d at present... BP today= 130/60, he monitors BP at home... denies HA, visual changes, CP, palipit, dizziness, syncope, edema, etc... ~  CXR 6/10 showed stable heart size s/p CABG, clear lungs, aortic calcif seen, NAD.[name]. ~  CXR 4/13 showed prev median sternotomy & CABG, norm heart size, clear lungs, NAD... ~  4/14: followed by [name]/Crenshaw on ASA81, Losartan50; s/p CABG 79 & 93; BP= 122/64 today & notes intermittent chest tightness & SOB, no palpit or edema; will will refer to Cards- ?cath to check grafts. ~  CXR 5/14 showed stable heart size, s/p CABG, clear lungs, NAD... ~  10/14:  On Losar50;  BP= 112/72 & he denies chest pain, palpit, ch in DOE, edema, etc...  CAD (ICD-414.00) - he takes ASA 81mg /d... followed by [name] w/ hx of 2 prev CABG's in 1979 & 1993...  ~  NuclearStressTest 3/07 showed infer infarct & mild peri-infarct ischemia, abn wall motion & EF=45%... essentially no change from 2003 ~  check up by [name] Q57mo- [facility], doing satis, no changes made, f/u Abd Ao doppler in Dec. ~  4/14: followed by [name]/Crenshaw on ASA81,  Losartan50; s/p CABG 79 & 93; BP= 122/64 today & notes intermittent chest tightness & SOB, no palpit or edema; will will refer to Cards- ?cath to check grafts. ~  EKG 5/14 showed SBrady, rate57, 1st degree AVB, LBBB/ poor R progression... ~  5/14: adm 5/22 - 04/19/13 by Cards for CP/ angina> cath showed an 80% stenosis in the SVG to the RCA, he had an angioplasty & drug eluting stent placed, and was placed on dual antiplat therapy w/ ASA/ Plavix.  PERIPHERAL VASCULAR DISEASE (ICD-443.9) - he takes the Aspirin 81mg /d... known ~3cm AAA being followed serially... ~  ABI's 3/06 were normal  ~  CT Abd 12/08 w/ 3.3cm focal saccular AAA in distal abd ao. ((it was 3cm in 05)... ~  AbdAo Doppler 11/08 w/ stable infra-renal fusiform aneurysm ~ 3cm... ~  AbdAo Doppler 12/10 showed stable ~3cm AAA, mod irreg plaque, norm iliacs... f/u 64yr. ~  AbdAo Doppler 12/11 showed stable ~3cm AAA, mod mid-distal Ao stenosis, f/u 43yr. ~  CTAbd 4/12 via ER (right kid stone) showed AAA measured 3.2cm, infrarenal, & 3.4cm in length. ~  AbdAo Doppler 1/13 showed stable 3.1cm AAA, no changes, f/u rec in 1 yr... ~  AbdAo Doppler 1/14 showed stable infrarenal saccular 3.4cm AAA, iliacs ok, no real change, f/u 25yr...  HYPERCHOLESTEROLEMIA (ICD-272.0) - on SIMVASTATIN 40mg /d... ~  FLP 7/08 showed TChol 99, TG 103, HDL 32, LDL 46 ~  FLP 8/09 showed TChol 101, TG 73, HDL 28, LDL 58 ~  FLP 8/10 showed TChol 106, TG 50, HDL 36, LDL 60 ~  FLP 3/12 showed TChol 107, TG 100, HDL 30, LDL 57 ~  FLP 3/13 on Simva40 showed TChol 97, TG 72, HDL 40, LDL 43 ~  FLP 6/13 by [name] showed TChol 103, TG 57, HDL 43, LDL 49 ~  FLP 4/14 on Simva40 showed TChol 112, TG 66, HDL 37, LDL 61   GERD (ICD-530.81) -  uses OTC meds as needed... last EGD 10/07 by DrSam showed sm HH otherw neg...  ~ 8/09: noted some dysphagia for large pills, offered GI referral but he declined.  DIVERTICULOSIS OF COLON (ICD-562.10) & COLONIC POLYPS (ICD-211.3) ~   colonoscopy 10/07 by DrSam showed divertics, and 7mm polyp= polypoid mucosa on path... f/u 53yrs planned... ~  2/10: noted some IBS symptoms and fecal incontinence, again offered GI referral but he declined. ~  3/11: now he is ready to discuss fecal incontinence w/ gastroenterology> saw [name] 4/11 & rec for icr fiber. ~  Colonoscopy 4/11 by [name] w/ mod divertics, otherw neg- rec Citrucel daily. ~  4/14:  followed byDrJacobs on Lialda- pt decr to 1Qod now & stable BMs.   FLANK PAIN, RIGHT (ICD-789.09) - he was seen 12/08 c/o Rt flank pain of 6 months duration... he had seen [name] and was unhappy w/ his eval, concerned about kidney stone vs recurrence of amebic dysentery... 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 BoneScan= arthritic uptake all over but none in rt flank area, ribs, etc...  Rx w/ heating pad and ETODOLAC... he reports that his discomfort pretty much resolved and he felt good about the thorough evaluation... ~  8/10:  prev right flank pain resolved- no recurrence. ~  4/12:  ER eval for right sided pain w/ CTAbd showing +right kid stone w/ hydroneph; AAA measured 3.2cm, infrarenal, & 3.4cm in length.  Hx of AMEBIC DYSENTERY (ICD-006.9)  BENIGN PROSTATIC HYPERTROPHY, HX OF (ICD-V13.8) - hx of increased PSA in the 6-8 range and followed by [name]... he has LTOS including the fact that it takes him 12 min to urinate and empty satisfactorily... he has nocturia x 2-3... he tells me that [name] didn't want to place him on meds, rather he rec a diet for his prostate health w/ fruits + vegetables, low fat, & no white foods like rice etc... his voiding symptoms are improved on this diet, he says... pt has refused cysto & bx of prostate, they are following his PSA's Q80mo. ~  2010: [name] started Avodart when PSA hit 11.7 & it improved to 4.3,  but pt c/o fatigue, weak, breast tenderness- so they are decreasing the Avodart to Qod, now Q3rd day (w/ sl improvement in symptoms). ~  7/11:  f/u [name] w/ PSA= 4.09 & symptoms improved on the Avodart Q3d... ~  3/12:  PSA= 4.29 & [name] switched him from Avodart to Proscar 5mg /d... ~  Pt reports regular f/u w/ [name]> he reports DRE is neg, & they have stopped doing PSA due to age... ~  on Finasteride5 (taking Q3rdDay); followed by [name] for BPH, BOO, elev PSA which was as hi as 11.7 but dropped to 3.69 in 2012 & they decided to forego further testing; last seen 10/13 & plans yearly check up.  DEGENERATIVE JOINT DISEASE (ICD-715.90) - he takes OSTEOBIFLEX Bid, + MVI, etc... he manages his mod severe arthritis pain very well and only uses an occas Advil... prev had Etodolac, Tramadol, DCN100- but stopped all of these...  GOUT (ICD-274.9) - on ALLOPURINOL 300mg /d...  ~  labs 2/09 showed Uric = 3.8 ~  labs 8/10 showed Uric = 3.8  BACK PAIN, LUMBAR (ICD-724.2) - s/p decompressive laminectomy 3/07 by [name]...  ANXIETY (ICD-300.00)   Past Surgical History  Procedure Laterality Date  . Coronary artery bypass graft  1979    x4 SVG-DIAG-LAD, SVG-OM-PDA  . Coronary artery bypass graft  1993    Redo x5 by Dr Gerrit Friends; [place], SVG-OM, SVG-AM-PL  . Inguinal hernia repair  1994    Right by Dr Gerrit Friends  . Inguinal hernia repair  1996    Left by Dr. Gerrit Friends  . Decompressive laminectomy  01/2006    L2 - scarum by Dr. Simonne Come  . Coronary angioplasty with stent placement  04/18/2013    RCA         Outpatient Encounter Prescriptions as of 08/28/2013  Medication Sig Dispense Refill  . allopurinol (ZYLOPRIM) 300 MG tablet Take 300 mg by mouth daily with breakfast.      . aspirin EC 81 MG tablet Take 81 mg by mouth every evening.      . beta carotene w/minerals (OCUVITE) tablet Take 1 tablet by mouth daily with breakfast.       . Cholecalciferol (VITAMIN D-3 PO) Take 2,000 Units by  mouth daily with breakfast.       . clopidogrel (PLAVIX) 75 MG tablet Take 1 tablet (75 mg total) by mouth daily with breakfast.  90 tablet  3  . finasteride (PROSCAR) 5 MG tablet See admin instructions. As directed      . folic acid (FOLVITE) 400 MCG tablet Take 400 mcg by mouth 2 (two) times daily.       [name] losartan (COZAAR) 50 MG tablet Take 1 tablet (50 mg total) by mouth 2 (two) times daily.  180 tablet  3  . mesalamine (LIALDA) 1.2 G EC tablet Take 1,200 mg by mouth See admin instructions. As directed      . methylcellulose (CITRUCEL) oral powder Take 1 packet by mouth daily with breakfast.      . Misc Natural Products (OSTEO BI-FLEX ADV JOINT SHIELD) TABS Take 1 tablet by mouth 2 (two) times daily.        . Multiple Vitamin (MULITIVITAMIN WITH MINERALS) TABS Take 1 tablet by mouth daily with breakfast.       . nitroGLYCERIN (NITROSTAT) 0.4 MG SL tablet Place 1 tablet (0.4 mg total) under the tongue every 5 (five) minutes as needed. For chest pain.  25 tablet  3  . simvastatin (ZOCOR) 40 MG tablet Take 1 tablet (40 mg total) by mouth every evening.  90 tablet  3   No facility-administered encounter medications on file as of 08/28/2013.    Allergies  Allergen Reactions  . Lisinopril     REACTION: dizziness  . Methocarbamol     REACTION: pt states "dizzy"  . Pregabalin     REACTION: pt states "dizzy"  . Ramipril     REACTION: hives and dizziness    Current Medications, Allergies, Past Medical History, Past Surgical History, Family History, and Social History were reviewed in [employer] record.    Review of Systems        See HPI - all other systems neg except as noted...  The patient complains of incontinence and muscle weakness.  The patient denies anorexia, fever, weight loss, weight gain, vision loss, decreased hearing, hoarseness, chest pain, syncope, dyspnea on exertion, peripheral edema, prolonged cough, headaches, hemoptysis, abdominal pain, melena,  hematochezia, severe indigestion/heartburn, hematuria, suspicious skin lesions, transient blindness, difficulty walking, depression, unusual weight change, abnormal bleeding, enlarged lymph nodes, and angioedema.     Objective:   Physical Exam      WD, WN, 79 y/o WM in NAD... GENERAL:  Alert & oriented; pleasant &  cooperative... HEENT:  /AT, EOM-wnl, PERRLA, EACs-clear, TMs-wnl, NOSE-clear, THROAT-clear & wnl. NECK:  Supple w/ fairROM; no JVD; normal carotid impulses w/o bruits; no thyromegaly or nodules palpated; no lymphadenopathy. CHEST:  Clear to P & A; without wheezes/ rales/ or rhonchi. HEART:  Regular Rhythm; gr 1/6 SEM, w/o rubs or gallops heard... ABDOMEN:  Soft & nontender; normal bowel sounds; no organomegaly or masses detected. EXT: without deformities, mod arthritic changes (esp hands); no varicose veins/ venous insuffic/ or edema. NEURO:  CN's intact; no focal neuro deficits... DERM:  No lesions noted; no rash etc...  RADIOLOGY DATA:  Reviewed in the EPIC EMR & discussed w/ the patient...  LABORATORY DATA:  Reviewed in the EPIC EMR & discussed w/ the patient...   Assessment & Plan:    Cardiac> HBP, CAD> followed by [name]/Crenshaw on ASA, Losartan; s/p CABG 79 & 93; BP controlled, s/p stent to SVG-RCA 5/14 & improved...     ASPVS> known 3cm AAA being followed; CTAbd done via ER 4/12= +right kid stone w/ hydroneph; AAA measured 3.2cm, infrarenal, & 3.4cm in length; Sonar 1/14 stable- no change in 3.4cm AAA (f/u planned 75yr)...     CHOL> on Simva40 & FLP at goals & seems to be tol med well...     GI> Gerd, Divertics, Polyps, Hx amebic dysentery> followed byDrJacobs on Lialda- pt decr to 1Qod now & stable BMs...     BPH> followed by [name] who switched off Avodart, on Proscar now but only taking every 3rd day?; denies voiding issues, nocturia diminished to 1-2...     DJD, Gout, LBP> stable on Allopurinol, Osteobiflex, Tylenol...    Patient's Medications  New  Prescriptions   No medications on file  Previous Medications   ALLOPURINOL (ZYLOPRIM) 300 MG TABLET    Take 300 mg by mouth daily with breakfast.   ASPIRIN EC 81 MG TABLET    Take 81 mg by mouth every evening.   BETA CAROTENE W/MINERALS (OCUVITE) TABLET    Take 1 tablet by mouth daily with breakfast.    CHOLECALCIFEROL (VITAMIN D-3 PO)    Take 2,000 Units by mouth daily with breakfast.    CLOPIDOGREL (PLAVIX) 75 MG TABLET    Take 1 tablet (75 mg total) by mouth daily with breakfast.   FINASTERIDE (PROSCAR) 5 MG TABLET    See admin instructions. As directed   FOLIC ACID (FOLVITE) 400 MCG TABLET    Take 400 mcg by mouth 2 (two) times daily.    LOSARTAN (COZAAR) 50 MG TABLET    Take 1 tablet (50 mg total) by mouth 2 (two) times daily.   MESALAMINE (LIALDA) 1.2 G EC TABLET    Take 1,200 mg by mouth See admin instructions. As directed   METHYLCELLULOSE (CITRUCEL) ORAL POWDER    Take 1 packet by mouth daily with breakfast.   MISC NATURAL PRODUCTS (OSTEO BI-FLEX ADV JOINT SHIELD) TABS    Take 1 tablet by mouth 2 (two) times daily.     MULTIPLE VITAMIN (MULITIVITAMIN WITH MINERALS) TABS    Take 1 tablet by mouth daily with breakfast.    NITROGLYCERIN (NITROSTAT) 0.4 MG SL TABLET    Place 1 tablet (0.4 mg total) under the tongue every 5 (five) minutes as needed. For chest pain.   SIMVASTATIN (ZOCOR) 40 MG TABLET    Take 1 tablet (40 mg total) by mouth every evening.  Modified Medications   No medications on file  Discontinued Medications   No medications on file

## 2013-09-04 ENCOUNTER — Other Ambulatory Visit: Payer: Self-pay | Admitting: Pulmonary Disease

## 2013-09-04 MED ORDER — ALLOPURINOL 300 MG PO TABS: 300.0000 mg | ORAL_TABLET | Freq: Every day | ORAL | Status: DC

## 2013-09-05 ENCOUNTER — Ambulatory Visit (HOSPITAL_COMMUNITY): Payer: Medicare Other | Attending: Cardiology | Admitting: Radiology

## 2013-09-05 ENCOUNTER — Other Ambulatory Visit (HOSPITAL_COMMUNITY): Payer: Self-pay | Admitting: Cardiology

## 2013-09-05 DIAGNOSIS — I079 Rheumatic tricuspid valve disease, unspecified: Secondary | ICD-10-CM | POA: Diagnosis not present

## 2013-09-05 DIAGNOSIS — I25810 Atherosclerosis of coronary artery bypass graft(s) without angina pectoris: Secondary | ICD-10-CM

## 2013-09-05 DIAGNOSIS — I255 Ischemic cardiomyopathy: Secondary | ICD-10-CM

## 2013-09-05 DIAGNOSIS — I714 Abdominal aortic aneurysm, without rupture, unspecified: Secondary | ICD-10-CM | POA: Insufficient documentation

## 2013-09-05 DIAGNOSIS — I379 Nonrheumatic pulmonary valve disorder, unspecified: Secondary | ICD-10-CM | POA: Diagnosis not present

## 2013-09-05 DIAGNOSIS — E785 Hyperlipidemia, unspecified: Secondary | ICD-10-CM | POA: Diagnosis not present

## 2013-09-05 DIAGNOSIS — I2589 Other forms of chronic ischemic heart disease: Secondary | ICD-10-CM

## 2013-09-05 DIAGNOSIS — I10 Essential (primary) hypertension: Secondary | ICD-10-CM | POA: Diagnosis not present

## 2013-09-05 DIAGNOSIS — I2510 Atherosclerotic heart disease of native coronary artery without angina pectoris: Secondary | ICD-10-CM | POA: Diagnosis not present

## 2013-09-05 NOTE — Progress Notes (Signed)
Echocardiogram performed.  

## 2013-09-06 ENCOUNTER — Telehealth: Payer: Self-pay | Admitting: Cardiology

## 2013-09-06 NOTE — Telephone Encounter (Signed)
Spoke with pt, aware of his echo results. He reports he is getting a tightness in his chest with moderate activity. With mowing the yard, taking the garbage up the hill to the end of the driveway. He reports the tightness will go away after rest. He has not tried NTG. He does not have any other symptoms of SOB or nausea. He does report this is the same type discomfort he had prior to his stent in may. He is going on a trip and will not be back until the 20 th. Explained to pt he needs to make sure he take his NTG with him and if he has any tightness that he does not get relief with rest or NTG to go somewhere to get help. Pt voiced understanding. appt made for the pt to see scott weaver [facility] [address] when he returns.

## 2013-09-06 NOTE — Telephone Encounter (Signed)
FYI    Pt called to say he will be out of town from 10/12 to 10/20 will like a call about his Echo then.  Thanks!

## 2013-09-17 ENCOUNTER — Ambulatory Visit (INDEPENDENT_AMBULATORY_CARE_PROVIDER_SITE_OTHER): Payer: Medicare Other | Admitting: Physician Assistant

## 2013-09-17 ENCOUNTER — Encounter: Payer: Self-pay | Admitting: *Deleted

## 2013-09-17 ENCOUNTER — Encounter: Payer: Self-pay | Admitting: Physician Assistant

## 2013-09-17 VITALS — BP 114/58 | HR 56 | Ht 72.0 in | Wt 173.1 lb

## 2013-09-17 DIAGNOSIS — I209 Angina pectoris, unspecified: Secondary | ICD-10-CM

## 2013-09-17 DIAGNOSIS — I10 Essential (primary) hypertension: Secondary | ICD-10-CM

## 2013-09-17 DIAGNOSIS — I2589 Other forms of chronic ischemic heart disease: Secondary | ICD-10-CM | POA: Diagnosis not present

## 2013-09-17 DIAGNOSIS — E785 Hyperlipidemia, unspecified: Secondary | ICD-10-CM

## 2013-09-17 DIAGNOSIS — I714 Abdominal aortic aneurysm, without rupture: Secondary | ICD-10-CM | POA: Diagnosis not present

## 2013-09-17 DIAGNOSIS — I25810 Atherosclerosis of coronary artery bypass graft(s) without angina pectoris: Secondary | ICD-10-CM | POA: Diagnosis not present

## 2013-09-17 DIAGNOSIS — I208 Other forms of angina pectoris: Secondary | ICD-10-CM

## 2013-09-17 MED ORDER — ISOSORBIDE MONONITRATE ER 30 MG PO TB24: 15.0000 mg | ORAL_TABLET | Freq: Every day | ORAL | Status: DC

## 2013-09-17 NOTE — Patient Instructions (Addendum)
Your physician has requested that you have a cardiac catheterization LEFT HEART CATH WITH DR. Excell Seltzer 09/23/13 @ 9 AM; DX EXERTIONAL ANGINA. Cardiac catheterization is used to diagnose and/or treat various heart conditions. Doctors may recommend this procedure for a number of different reasons. The most common reason is to evaluate chest pain. Chest pain can be a symptom of coronary artery disease (CAD), and cardiac catheterization can show whether plaque is narrowing or blocking your heart's arteries. This procedure is also used to evaluate the valves, as well as measure the blood flow and oxygen levels in different parts of your heart. For further information please visit [email or web address] Please follow instruction sheet, as given.  LABS TODAY; BMET, CBC W/DIFF, PT/INR  START IMDUR 30 MG TABLET ; TAKE 1/2 TAB DAILY = 15 MG DAILY

## 2013-09-17 NOTE — H&P (Signed)
History and Physical  Date:  09/17/2013   ID:  Daniel Reeves, DOB [birth year], MRN [id number]  PCP:  Michele Mcalpine, MD  Cardiologist:  Dr. [name]     History of Present Illness: Daniel Reeves is a 79 y.o. male who returns for the evaluation of chest pain.  He has a hx of CAD, s/p CABG in 1979 and 1993, ischemic CM, systolic CHF, AAA, HTN, HL.  Abdominal US in 11/2012 showed an aneurysm of 3.3 x 3.4 cm. Followup recommended in one year. Patient underwent cardiac catheterization in May of 2014. The left main, LAD, circumflex and RCA were occluded. The LIMA to the LAD was patent and the distal LAD was occluded after the insertion. Saphenous vein graft to the obtuse marginal was occluded. Saphenous vein graft to the acute marginal and PDA had a high-grade lesion prior to insertion into the PDA of 80-90%. Ejection fraction was 25%. PCI:  Promus Premier (3.5x12 mm) DES to the [facility].   Last seen by Dr. [name] in 05/2013.  Follow up echo (09/05/13): EF 35%, diffuse HK worsened distal septal, mid/distal inferior and apical region, grade 1 diastolic dysfunction, mild LAE.  He had done well without recurrent chest pain after his PCI in 03/2013. Over the last one to 2 weeks, he notes recurrent chest discomfort that he describes as tightness that occurs with moderate exertion. He describes CCS class 2-3 symptoms. He denies significant dyspnea. He denies associated radiation, nausea, diaphoresis. He denies syncope. He denies orthopnea, PND. He has mild pedal edema. Left leg is worse than the right due to knee arthritis and recent leg injury. No significant changes.  Labs (6/14):   K 3.9, creatinine 1.1, Hgb 13.3   Wt Readings from Last 3 Encounters:  08/28/13 170 lb 12.8 oz (77.474 kg)  06/05/13 171 lb (77.565 kg)  04/29/13 174 lb 12.8 oz (79.289 kg)     Past Medical History  Diagnosis Date  . Cerumen impaction     Bilateral  . Hypertension   . Peripheral vascular disease   .  Hypercholesterolemia   . GERD (gastroesophageal reflux disease)   . Diverticulosis of colon   . Amebic dysentery   . Benign prostatic hypertrophy   . Degenerative joint disease   . Gout   . Lumbar back pain   . Anxiety   . Nephrolithiasis   . Heart murmur   . CAD (coronary artery disease)     a. s/p CABG in 1979 and 1993;  b. LHC (5/14):  LM, LAD, CFX and RCA occluded; L-LAD ok, dLAD occluded after insertion of LIMA, S-OM occluded, S-PDA/AM 80-90 => PCI with Promus DES; EF 25%  . Chronic systolic CHF (congestive heart failure)   . Ischemic cardiomyopathy     a. echo (09/05/13): EF 35%, diffuse HK worsened distal septal, mid/distal inferior and apical region, grade 1 diastolic dysfunction, mild LAE.    [name] Abdominal aortic aneurysm     a. [place] (1/14):  3.3 x 3.4 cm => f/u 11/2013    Current Outpatient Prescriptions  Medication Sig Dispense Refill  . allopurinol (ZYLOPRIM) 300 MG tablet Take 1 tablet (300 mg total) by mouth daily with breakfast.  90 tablet  3  . aspirin EC 81 MG tablet Take 81 mg by mouth every evening.      . beta carotene w/minerals (OCUVITE) tablet Take 1 tablet by mouth daily with breakfast.       . Cholecalciferol (VITAMIN D-3 PO) Take 2,000 Units  by mouth daily with breakfast.       . clopidogrel (PLAVIX) 75 MG tablet Take 1 tablet (75 mg total) by mouth daily with breakfast.  90 tablet  3  . finasteride (PROSCAR) 5 MG tablet See admin instructions. As directed      . folic acid (FOLVITE) 400 MCG tablet Take 400 mcg by mouth 2 (two) times daily.       [name] losartan (COZAAR) 50 MG tablet Take 1 tablet (50 mg total) by mouth 2 (two) times daily.  180 tablet  3  . mesalamine (LIALDA) 1.2 G EC tablet Take 1,200 mg by mouth See admin instructions. As directed      . methylcellulose (CITRUCEL) oral powder Take 1 packet by mouth daily with breakfast.      . Misc Natural Products (OSTEO BI-FLEX ADV JOINT SHIELD) TABS Take 1 tablet by mouth 2 (two) times daily.        . Multiple  Vitamin (MULITIVITAMIN WITH MINERALS) TABS Take 1 tablet by mouth daily with breakfast.       . nitroGLYCERIN (NITROSTAT) 0.4 MG SL tablet Place 1 tablet (0.4 mg total) under the tongue every 5 (five) minutes as needed. For chest pain.  25 tablet  3  . simvastatin (ZOCOR) 40 MG tablet Take 1 tablet (40 mg total) by mouth every evening.  90 tablet  3   No current facility-administered medications for this visit.    Allergies:    Allergies  Allergen Reactions  . Lisinopril     REACTION: dizziness  . Methocarbamol     REACTION: pt states "dizzy"  . Pregabalin     REACTION: pt states "dizzy"  . Ramipril     REACTION: hives and dizziness    Social History:  The patient  reports that he quit smoking about 68 years ago. He has never used smokeless tobacco. He reports that he drinks about 7.0 ounces of alcohol per week. He reports that he does not use illicit drugs.   Family History:  The patient's family history includes Parkinsonism in his brother.   ROS:  Please see the history of present illness.      All other systems reviewed and negative.   PHYSICAL EXAM: VS:  BP 114/58  Pulse 56  Ht 6' (1.829 m)  Wt 173 lb 1.9 oz (78.527 kg)  BMI 23.47 kg/m2 Well nourished, well developed, in no acute distress HEENT: normal Neck: no JVD Cardiac:  normal S1, S2; RRR; no murmur Lungs:  clear to auscultation bilaterally, no wheezing, rhonchi or rales Abd: soft, nontender, no hepatomegaly Ext: trace bilateral LE edema Skin: warm and dry Neuro:  CNs 2-12 intact, no focal abnormalities noted  EKG:  Sinus bradycardia, HR 56, LBBB     ASSESSMENT AND PLAN:  1. Exertional Angina:  He describes recurrent CCS class 2-3 exertional angina. I have recommended proceeding with cardiac catheterization to further evaluate his symptoms. I reviewed this with Dr. Jens Som who agreed.  Risks and benefits of cardiac catheterization have been discussed with the patient.  These include bleeding, infection, kidney  damage, stroke, heart attack, death.  The patient understands these risks and is willing to proceed.  I will place him on low-dose isosorbide 15 mg daily. He does not take PDE-5 inhibitors.  Continue aspirin and Plavix. 2. CAD:  Proceed with cardiac catheterization as noted. Continue aspirin, Plavix, statin. 3. Ischemic CM:  Continue ARB. He is not on beta blocker therapy due to bradycardia. 4. Chronic Systolic  CHF:  Volume stable. 5. Hypertension:  Controlled. 6. Hyperlipidemia:  Continue statin. 7. AAA:  Follow up US 11/2013. 8. Disposition:  Proceed with cardiac catheterization next week as planned.  Signed, Tereso Newcomer, PA-C  09/17/2013 3:32 PM

## 2013-09-17 NOTE — Progress Notes (Signed)
[address], [address] [place], [place]  [address] Phone: [phone] Fax:  [phone]  Date:  09/17/2013   ID:  Daniel Reeves, DOB 11/01/1924, MRN [id number]  PCP:  Daniel Meister M, MD  Cardiologist:  Dr. Brian Crenshaw     History of Present Illness: Daniel Reeves is a 79 y.o. male who returns for the evaluation of chest pain.  He has a hx of CAD, s/p CABG in 1979 and 1993, ischemic CM, systolic CHF, AAA, HTN, HL.  Abdominal US in 11/2012 showed an aneurysm of 3.3 x 3.4 cm. Followup recommended in one year. Patient underwent cardiac catheterization in May of 2014. The left main, LAD, circumflex and RCA were occluded. The LIMA to the LAD was patent and the distal LAD was occluded after the insertion. Saphenous vein graft to the obtuse marginal was occluded. Saphenous vein graft to the acute marginal and PDA had a high-grade lesion prior to insertion into the PDA of 80-90%. Ejection fraction was 25%. PCI:  Promus Premier (3.5x12 mm) DES to the S-RCA.   Last seen by Dr. Brian Crenshaw in 05/2013.  Follow up echo (09/05/13): EF 35%, diffuse HK worsened distal septal, mid/distal inferior and apical region, grade 1 diastolic dysfunction, mild LAE.  He had done well without recurrent chest pain after his PCI in 03/2013. Over the last one to 2 weeks, he notes recurrent chest discomfort that he describes as tightness that occurs with moderate exertion. He describes CCS class 2-3 symptoms. He denies significant dyspnea. He denies associated radiation, nausea, diaphoresis. He denies syncope. He denies orthopnea, PND. He has mild pedal edema. Left leg is worse than the right due to knee arthritis and recent leg injury. No significant changes.  Labs (6/14):   K 3.9, creatinine 1.1, Hgb 13.3   Wt Readings from Last 3 Encounters:  08/28/13 170 lb 12.8 oz (77.474 kg)  06/05/13 171 lb (77.565 kg)  04/29/13 174 lb 12.8 oz (79.289 kg)     Past Medical History  Diagnosis Date  . Cerumen impaction    Bilateral  . Hypertension   . Peripheral vascular disease   . Hypercholesterolemia   . GERD (gastroesophageal reflux disease)   . Diverticulosis of colon   . Amebic dysentery   . Benign prostatic hypertrophy   . Degenerative joint disease   . Gout   . Lumbar back pain   . Anxiety   . Nephrolithiasis   . Heart murmur   . CAD (coronary artery disease)     a. s/p CABG in 1979 and 1993;  b. LHC (5/14):  LM, LAD, CFX and RCA occluded; L-LAD ok, dLAD occluded after insertion of LIMA, S-OM occluded, S-PDA/AM 80-90 => PCI with Promus DES; EF 25%  . Chronic systolic CHF (congestive heart failure)   . Ischemic cardiomyopathy     a. echo (09/05/13): EF 35%, diffuse HK worsened distal septal, mid/distal inferior and apical region, grade 1 diastolic dysfunction, mild LAE.    . Abdominal aortic aneurysm     a. US (1/14):  3.3 x 3.4 cm => f/u 11/2013    Current Outpatient Prescriptions  Medication Sig Dispense Refill  . allopurinol (ZYLOPRIM) 300 MG tablet Take 1 tablet (300 mg total) by mouth daily with breakfast.  90 tablet  3  . aspirin EC 81 MG tablet Take 81 mg by mouth every evening.      . beta carotene w/minerals (OCUVITE) tablet Take 1 tablet by mouth daily with breakfast.       .   Cholecalciferol (VITAMIN D-3 PO) Take 2,000 Units by mouth daily with breakfast.       . clopidogrel (PLAVIX) 75 MG tablet Take 1 tablet (75 mg total) by mouth daily with breakfast.  90 tablet  3  . finasteride (PROSCAR) 5 MG tablet See admin instructions. As directed      . folic acid (FOLVITE) 400 MCG tablet Take 400 mcg by mouth 2 (two) times daily.       . losartan (COZAAR) 50 MG tablet Take 1 tablet (50 mg total) by mouth 2 (two) times daily.  180 tablet  3  . mesalamine (LIALDA) 1.2 G EC tablet Take 1,200 mg by mouth See admin instructions. As directed      . methylcellulose (CITRUCEL) oral powder Take 1 packet by mouth daily with breakfast.      . Misc Natural Products (OSTEO BI-FLEX ADV JOINT SHIELD) TABS  Take 1 tablet by mouth 2 (two) times daily.        . Multiple Vitamin (MULITIVITAMIN WITH MINERALS) TABS Take 1 tablet by mouth daily with breakfast.       . nitroGLYCERIN (NITROSTAT) 0.4 MG SL tablet Place 1 tablet (0.4 mg total) under the tongue every 5 (five) minutes as needed. For chest pain.  25 tablet  3  . simvastatin (ZOCOR) 40 MG tablet Take 1 tablet (40 mg total) by mouth every evening.  90 tablet  3   No current facility-administered medications for this visit.    Allergies:    Allergies  Allergen Reactions  . Lisinopril     REACTION: dizziness  . Methocarbamol     REACTION: pt states "dizzy"  . Pregabalin     REACTION: pt states "dizzy"  . Ramipril     REACTION: hives and dizziness    Social History:  The patient  reports that he quit smoking about 68 years ago. He has never used smokeless tobacco. He reports that he drinks about 7.0 ounces of alcohol per week. He reports that he does not use illicit drugs.   Family History:  The patient's family history includes Parkinsonism in his brother.   ROS:  Please see the history of present illness.      All other systems reviewed and negative.   PHYSICAL EXAM: VS:  BP 114/58  Pulse 56  Ht 6' (1.829 Reeves)  Wt 173 lb 1.9 oz (78.527 kg)  BMI 23.47 kg/m2 Well nourished, well developed, in no acute distress HEENT: normal Neck: no JVD Cardiac:  normal S1, S2; RRR; no murmur Lungs:  clear to auscultation bilaterally, no wheezing, rhonchi or rales Abd: soft, nontender, no hepatomegaly Ext: trace bilateral LE edema Skin: warm and dry Neuro:  CNs 2-12 intact, no focal abnormalities noted  EKG:  Sinus bradycardia, HR 56, LBBB     ASSESSMENT AND PLAN:  1. Exertional Angina:  He describes recurrent CCS class 2-3 exertional angina. I have recommended proceeding with cardiac catheterization to further evaluate his symptoms. I reviewed this with Dr. Crenshaw who agreed.  Risks and benefits of cardiac catheterization have been  discussed with the patient.  These include bleeding, infection, kidney damage, stroke, heart attack, death.  The patient understands these risks and is willing to proceed.  I will place him on low-dose isosorbide 15 mg daily. He does not take PDE-5 inhibitors.  Continue aspirin and Plavix. 2. CAD:  Proceed with cardiac catheterization as noted. Continue aspirin, Plavix, statin. 3. Ischemic CM:  Continue ARB. He is not on beta blocker   therapy due to bradycardia. 4. Chronic Systolic CHF:  Volume stable. 5. Hypertension:  Controlled. 6. Hyperlipidemia:  Continue statin. 7. AAA:  Follow up US 11/2013. 8. Disposition:  Proceed with cardiac catheterization next week as planned.  Signed, Sharah Finnell, PA-C  09/17/2013 3:32 PM   

## 2013-09-17 NOTE — H&P (Signed)
Agree with plan as outlined Brian Crenshaw  

## 2013-09-18 LAB — CBC WITH DIFFERENTIAL/PLATELET
Basophils Absolute: 0 10*3/uL (ref 0.0–0.1)
Basophils Relative: 0.1 % (ref 0.0–3.0)
Eosinophils Absolute: 0.2 10*3/uL (ref 0.0–0.7)
Eosinophils Relative: 2.7 % (ref 0.0–5.0)
Hemoglobin: 13.1 g/dL (ref 13.0–17.0)
Lymphocytes Relative: 20.7 % (ref 12.0–46.0)
MCHC: 34.4 g/dL (ref 30.0–36.0)
Monocytes Relative: 9.9 % (ref 3.0–12.0)
Neutro Abs: 4.3 10*3/uL (ref 1.4–7.7)
RBC: 3.94 Mil/uL — ABNORMAL LOW (ref 4.22–5.81)
RDW: 14.4 % (ref 11.5–14.6)
WBC: 6.5 10*3/uL (ref 4.5–10.5)

## 2013-09-18 LAB — BASIC METABOLIC PANEL
CO2: 25 mEq/L (ref 19–32)
Chloride: 106 mEq/L (ref 96–112)
Glucose, Bld: 91 mg/dL (ref 70–99)
Potassium: 4.1 mEq/L (ref 3.5–5.1)
Sodium: 138 mEq/L (ref 135–145)

## 2013-09-20 ENCOUNTER — Encounter (HOSPITAL_COMMUNITY): Payer: Self-pay | Admitting: Pharmacy Technician

## 2013-09-20 ENCOUNTER — Telehealth: Payer: Self-pay | Admitting: Physician Assistant

## 2013-09-20 NOTE — Telephone Encounter (Signed)
New problem    Pt called to speak to Danielle Rankin,  He would like a call back about his Cath 10/27.   He has questions about what they based the cath on, was it the EKG, BLOOD work and ECHO.

## 2013-09-20 NOTE — Telephone Encounter (Signed)
Returned call to patient he stated he wanted to know why he was having a cardiac cath 09/23/13.After reviewing patient's chart based on symptoms of chest pain at 09/17/13 office visit with Tereso Newcomer PA and given history and 09/05/13 echo results, that is why a cath was scheduled.

## 2013-09-23 ENCOUNTER — Encounter (HOSPITAL_COMMUNITY)
Admission: RE | Disposition: A | Discharge status: Home / Self Care | Payer: Self-pay | Source: Ambulatory Visit | Attending: Cardiovascular Disease

## 2013-09-23 ENCOUNTER — Ambulatory Visit (HOSPITAL_COMMUNITY)
Admission: RE | Admit: 2013-09-23 | Discharge: 2013-09-23 | Disposition: A | Discharge status: Home / Self Care | Payer: Medicare Other | Source: Ambulatory Visit | Attending: Cardiovascular Disease | Admitting: Cardiovascular Disease

## 2013-09-23 DIAGNOSIS — I2510 Atherosclerotic heart disease of native coronary artery without angina pectoris: Secondary | ICD-10-CM

## 2013-09-23 DIAGNOSIS — M109 Gout, unspecified: Secondary | ICD-10-CM | POA: Insufficient documentation

## 2013-09-23 DIAGNOSIS — I208 Other forms of angina pectoris: Secondary | ICD-10-CM

## 2013-09-23 DIAGNOSIS — E7800 Pure hypercholesterolemia, unspecified: Secondary | ICD-10-CM | POA: Insufficient documentation

## 2013-09-23 DIAGNOSIS — Z7982 Long term (current) use of aspirin: Secondary | ICD-10-CM | POA: Insufficient documentation

## 2013-09-23 DIAGNOSIS — Z79899 Other long term (current) drug therapy: Secondary | ICD-10-CM | POA: Diagnosis not present

## 2013-09-23 DIAGNOSIS — I2589 Other forms of chronic ischemic heart disease: Secondary | ICD-10-CM | POA: Diagnosis not present

## 2013-09-23 DIAGNOSIS — I739 Peripheral vascular disease, unspecified: Secondary | ICD-10-CM | POA: Diagnosis not present

## 2013-09-23 DIAGNOSIS — Y831 Surgical operation with implant of artificial internal device as the cause of abnormal reaction of the patient, or of later complication, without mention of misadventure at the time of the procedure: Secondary | ICD-10-CM | POA: Insufficient documentation

## 2013-09-23 DIAGNOSIS — F411 Generalized anxiety disorder: Secondary | ICD-10-CM | POA: Diagnosis not present

## 2013-09-23 DIAGNOSIS — I209 Angina pectoris, unspecified: Secondary | ICD-10-CM | POA: Insufficient documentation

## 2013-09-23 DIAGNOSIS — I5022 Chronic systolic (congestive) heart failure: Secondary | ICD-10-CM | POA: Diagnosis not present

## 2013-09-23 DIAGNOSIS — I2582 Chronic total occlusion of coronary artery: Secondary | ICD-10-CM | POA: Insufficient documentation

## 2013-09-23 DIAGNOSIS — K219 Gastro-esophageal reflux disease without esophagitis: Secondary | ICD-10-CM | POA: Insufficient documentation

## 2013-09-23 DIAGNOSIS — T82897A Other specified complication of cardiac prosthetic devices, implants and grafts, initial encounter: Secondary | ICD-10-CM | POA: Diagnosis not present

## 2013-09-23 DIAGNOSIS — I714 Abdominal aortic aneurysm, without rupture, unspecified: Secondary | ICD-10-CM | POA: Insufficient documentation

## 2013-09-23 DIAGNOSIS — Z87442 Personal history of urinary calculi: Secondary | ICD-10-CM | POA: Insufficient documentation

## 2013-09-23 DIAGNOSIS — I509 Heart failure, unspecified: Secondary | ICD-10-CM | POA: Insufficient documentation

## 2013-09-23 DIAGNOSIS — I10 Essential (primary) hypertension: Secondary | ICD-10-CM | POA: Diagnosis not present

## 2013-09-23 DIAGNOSIS — M1990 Unspecified osteoarthritis, unspecified site: Secondary | ICD-10-CM | POA: Insufficient documentation

## 2013-09-23 DIAGNOSIS — K5730 Diverticulosis of large intestine without perforation or abscess without bleeding: Secondary | ICD-10-CM | POA: Diagnosis not present

## 2013-09-23 DIAGNOSIS — I25810 Atherosclerosis of coronary artery bypass graft(s) without angina pectoris: Secondary | ICD-10-CM | POA: Insufficient documentation

## 2013-09-23 HISTORY — PX: LEFT HEART CATHETERIZATION WITH CORONARY ANGIOGRAM: SHX5451

## 2013-09-23 SURGERY — LEFT HEART CATHETERIZATION WITH CORONARY ANGIOGRAM
Anesthesia: LOCAL

## 2013-09-23 MED ORDER — NITROGLYCERIN 0.2 MG/ML ON CALL CATH LAB
INTRAVENOUS | Status: AC
  Filled 2013-09-23: qty 1

## 2013-09-23 MED ORDER — SODIUM CHLORIDE 0.9 % IV SOLN
INTRAVENOUS | Status: DC
  Administered 2013-09-23: 08:00:00 via INTRAVENOUS

## 2013-09-23 MED ORDER — ACETAMINOPHEN 325 MG PO TABS: 650.0000 mg | ORAL_TABLET | ORAL | Status: DC | PRN

## 2013-09-23 MED ORDER — ONDANSETRON HCL 4 MG/2ML IJ SOLN: 4.0000 mg | Freq: Four times a day (QID) | INTRAMUSCULAR | Status: DC | PRN

## 2013-09-23 MED ORDER — MIDAZOLAM HCL 2 MG/2ML IJ SOLN
INTRAMUSCULAR | Status: AC
  Filled 2013-09-23: qty 2

## 2013-09-23 MED ORDER — ASPIRIN 81 MG PO CHEW
CHEWABLE_TABLET | ORAL | Status: AC
  Administered 2013-09-23: 81 mg via ORAL
  Filled 2013-09-23: qty 1

## 2013-09-23 MED ORDER — FENTANYL CITRATE 0.05 MG/ML IJ SOLN
INTRAMUSCULAR | Status: AC
  Filled 2013-09-23: qty 2

## 2013-09-23 MED ORDER — SODIUM CHLORIDE 0.9 % IV SOLN: 250.0000 mL | INTRAVENOUS | Status: DC | PRN

## 2013-09-23 MED ORDER — LIDOCAINE HCL (PF) 1 % IJ SOLN
INTRAMUSCULAR | Status: AC
  Filled 2013-09-23: qty 30

## 2013-09-23 MED ORDER — ASPIRIN 81 MG PO CHEW
81.0000 mg | CHEWABLE_TABLET | ORAL | Status: AC
  Administered 2013-09-23: 81 mg via ORAL

## 2013-09-23 MED ORDER — SODIUM CHLORIDE 0.9 % IJ SOLN: 3.0000 mL | Freq: Two times a day (BID) | INTRAMUSCULAR | Status: DC

## 2013-09-23 MED ORDER — SODIUM CHLORIDE 0.9 % IV SOLN: 1.0000 mL/kg/h | INTRAVENOUS | Status: DC

## 2013-09-23 MED ORDER — HEPARIN (PORCINE) IN NACL 2-0.9 UNIT/ML-% IJ SOLN
INTRAMUSCULAR | Status: AC
  Filled 2013-09-23: qty 1000

## 2013-09-23 MED ORDER — SODIUM CHLORIDE 0.9 % IJ SOLN: 3.0000 mL | INTRAMUSCULAR | Status: DC | PRN

## 2013-09-23 MED ORDER — VERAPAMIL HCL 2.5 MG/ML IV SOLN
INTRAVENOUS | Status: AC
  Filled 2013-09-23: qty 2

## 2013-09-23 MED ORDER — HEPARIN SODIUM (PORCINE) 1000 UNIT/ML IJ SOLN
INTRAMUSCULAR | Status: AC
  Filled 2013-09-23: qty 1

## 2013-09-23 NOTE — CV Procedure (Signed)
    Cardiac Catheterization Procedure Note  Name: Daniel Reeves MRN: [id number] DOB: Aug 18, 1924  Procedure: Left Heart Cath, Selective Coronary Angiography, SVG angiography, LIMA angiography  Indication: CCS Class 3 angina   Procedural Details: The left wrist was prepped, draped, and anesthetized with 1% lidocaine. Using the modified Seldinger technique, a 5/6 French sheath was introduced into the left radial artery. 3 mg of verapamil was administered through the sheath, weight-based unfractionated heparin was administered intravenously. Standard Judkins catheters were used for selective coronary angiography and left ventriculography. Catheter exchanges were performed over an exchange length guidewire. There were no immediate procedural complications. A TR band was used for radial hemostasis at the completion of the procedure.  The patient was transferred to the post catheterization recovery area for further monitoring.  Procedural Findings: Hemodynamics: AO 137/57 LV 133/13  Coronary angiography: Coronary dominance: right  Left mainstem: Not selectively injected. Known to be totally occluded.  Left anterior descending (LAD): The LAD fills retrograde from the mammary artery. This supplies the first and second diagonal branches which have no significant disease. The LAD beyond the graft anastomotic site is occluded.  Left circumflex (LCx): The left circumflex is totally occluded. It fills via collaterals from the right coronary artery. There are 3 small obtuse marginal branches appear  Right coronary artery (RCA): The native vessel is totally occluded. The vessel fills via graft flow. There is a graft that supplies the acute marginal branch and the PDA.  LIMA to LAD: Widely patent. The LAD is occluded after the LIMA anastomotic site. The LAD fills retrograde as detailed above. The distal LAD fills via right to left collaterals.  Saphenous vein graft sequential to the acute marginal  branch and PDA branch of the RCA is patent. At the acute marginal anastomotic site there is 40-50% stenosis. This is been previously noted. The stent in the distal body of the graft just before the PDA insertion has 30% in-stent restenosis. There is an extensive amount of myocardium supplied by collaterals from this graft with a well formed collateral by the acute marginal supplying the apical LAD and a collateral from the posterolateral branch and PDA branch supplying the OM branches of the circumflex.  Left ventriculography: Deferred. The patient has known severe LV dysfunction with an ejection fraction less than 30%.  Final Conclusions:   1. Severe native three-vessel coronary artery disease with known total occlusion of the left main and proximal RCA 2. Continued patency of the LIMA to LAD and saphenous vein graft sequential to the acute marginal and PDA branches of the RCA 3. Continued patency of the stented segment in the distal body of the saphenous vein graft to PDA with mild in-stent restenosis noted 4. Known total occlusion of all other bypass grafts.  Recommendations: Continued medical therapy.  [name] 09/23/2013, 9:39 AM

## 2013-09-23 NOTE — Interval H&P Note (Signed)
History and Physical Interval Note:  09/23/2013 9:08 AM  Daniel Reeves  has presented today for surgery, with the diagnosis of Chest pain  The various methods of treatment have been discussed with the patient and family. After consideration of risks, benefits and other options for treatment, the patient has consented to  Procedure(s): LEFT HEART CATHETERIZATION WITH CORONARY ANGIOGRAM (N/A) as a surgical intervention .  The patient's history has been reviewed, patient examined, no change in status, stable for surgery.  I have reviewed the patient's chart and labs.  Questions were answered to the patient's satisfaction.    Cath Lab Visit (complete for each Cath Lab visit)  Clinical Evaluation Leading to the Procedure:   ACS: no  Non-ACS:    Anginal Classification: CCS III  Anti-ischemic medical therapy: No Therapy  Non-Invasive Test Results: No non-invasive testing performed  Prior CABG: Previous CABG         [name]

## 2013-09-23 NOTE — H&P (View-Only) (Signed)
[address] [place], [place]  [address] Phone: [phone] Fax:  [phone]  Date:  09/17/2013   ID:  Daniel Reeves, DOB 09/25/1924, MRN [id number]  PCP:  Michele Mcalpine, MD  Cardiologist:  Dr. [name]     History of Present Illness: Daniel Reeves is a 79 y.o. male who returns for the evaluation of chest pain.  He has a hx of CAD, s/p CABG in 1979 and 1993, ischemic CM, systolic CHF, AAA, HTN, HL.  Abdominal US in 11/2012 showed an aneurysm of 3.3 x 3.4 cm. Followup recommended in one year. Patient underwent cardiac catheterization in May of 2014. The left main, LAD, circumflex and RCA were occluded. The LIMA to the LAD was patent and the distal LAD was occluded after the insertion. Saphenous vein graft to the obtuse marginal was occluded. Saphenous vein graft to the acute marginal and PDA had a high-grade lesion prior to insertion into the PDA of 80-90%. Ejection fraction was 25%. PCI:  Promus Premier (3.5x12 mm) DES to the [facility].   Last seen by Dr. [name] in 05/2013.  Follow up echo (09/05/13): EF 35%, diffuse HK worsened distal septal, mid/distal inferior and apical region, grade 1 diastolic dysfunction, mild LAE.  He had done well without recurrent chest pain after his PCI in 03/2013. Over the last one to 2 weeks, he notes recurrent chest discomfort that he describes as tightness that occurs with moderate exertion. He describes CCS class 2-3 symptoms. He denies significant dyspnea. He denies associated radiation, nausea, diaphoresis. He denies syncope. He denies orthopnea, PND. He has mild pedal edema. Left leg is worse than the right due to knee arthritis and recent leg injury. No significant changes.  Labs (6/14):   K 3.9, creatinine 1.1, Hgb 13.3   Wt Readings from Last 3 Encounters:  08/28/13 170 lb 12.8 oz (77.474 kg)  06/05/13 171 lb (77.565 kg)  04/29/13 174 lb 12.8 oz (79.289 kg)     Past Medical History  Diagnosis Date  . Cerumen impaction    Bilateral  . Hypertension   . Peripheral vascular disease   . Hypercholesterolemia   . GERD (gastroesophageal reflux disease)   . Diverticulosis of colon   . Amebic dysentery   . Benign prostatic hypertrophy   . Degenerative joint disease   . Gout   . Lumbar back pain   . Anxiety   . Nephrolithiasis   . Heart murmur   . CAD (coronary artery disease)     a. s/p CABG in 1979 and 1993;  b. LHC (5/14):  LM, LAD, CFX and RCA occluded; L-LAD ok, dLAD occluded after insertion of LIMA, S-OM occluded, S-PDA/AM 80-90 => PCI with Promus DES; EF 25%  . Chronic systolic CHF (congestive heart failure)   . Ischemic cardiomyopathy     a. echo (09/05/13): EF 35%, diffuse HK worsened distal septal, mid/distal inferior and apical region, grade 1 diastolic dysfunction, mild LAE.    [name] Abdominal aortic aneurysm     a. [place] (1/14):  3.3 x 3.4 cm => f/u 11/2013    Current Outpatient Prescriptions  Medication Sig Dispense Refill  . allopurinol (ZYLOPRIM) 300 MG tablet Take 1 tablet (300 mg total) by mouth daily with breakfast.  90 tablet  3  . aspirin EC 81 MG tablet Take 81 mg by mouth every evening.      . beta carotene w/minerals (OCUVITE) tablet Take 1 tablet by mouth daily with breakfast.       .  Cholecalciferol (VITAMIN D-3 PO) Take 2,000 Units by mouth daily with breakfast.       . clopidogrel (PLAVIX) 75 MG tablet Take 1 tablet (75 mg total) by mouth daily with breakfast.  90 tablet  3  . finasteride (PROSCAR) 5 MG tablet See admin instructions. As directed      . folic acid (FOLVITE) 400 MCG tablet Take 400 mcg by mouth 2 (two) times daily.       [name] losartan (COZAAR) 50 MG tablet Take 1 tablet (50 mg total) by mouth 2 (two) times daily.  180 tablet  3  . mesalamine (LIALDA) 1.2 G EC tablet Take 1,200 mg by mouth See admin instructions. As directed      . methylcellulose (CITRUCEL) oral powder Take 1 packet by mouth daily with breakfast.      . Misc Natural Products (OSTEO BI-FLEX ADV JOINT SHIELD) TABS  Take 1 tablet by mouth 2 (two) times daily.        . Multiple Vitamin (MULITIVITAMIN WITH MINERALS) TABS Take 1 tablet by mouth daily with breakfast.       . nitroGLYCERIN (NITROSTAT) 0.4 MG SL tablet Place 1 tablet (0.4 mg total) under the tongue every 5 (five) minutes as needed. For chest pain.  25 tablet  3  . simvastatin (ZOCOR) 40 MG tablet Take 1 tablet (40 mg total) by mouth every evening.  90 tablet  3   No current facility-administered medications for this visit.    Allergies:    Allergies  Allergen Reactions  . Lisinopril     REACTION: dizziness  . Methocarbamol     REACTION: pt states "dizzy"  . Pregabalin     REACTION: pt states "dizzy"  . Ramipril     REACTION: hives and dizziness    Social History:  The patient  reports that he quit smoking about 68 years ago. He has never used smokeless tobacco. He reports that he drinks about 7.0 ounces of alcohol per week. He reports that he does not use illicit drugs.   Family History:  The patient's family history includes Parkinsonism in his brother.   ROS:  Please see the history of present illness.      All other systems reviewed and negative.   PHYSICAL EXAM: VS:  BP 114/58  Pulse 56  Ht 6' (1.829 m)  Wt 173 lb 1.9 oz (78.527 kg)  BMI 23.47 kg/m2 Well nourished, well developed, in no acute distress HEENT: normal Neck: no JVD Cardiac:  normal S1, S2; RRR; no murmur Lungs:  clear to auscultation bilaterally, no wheezing, rhonchi or rales Abd: soft, nontender, no hepatomegaly Ext: trace bilateral LE edema Skin: warm and dry Neuro:  CNs 2-12 intact, no focal abnormalities noted  EKG:  Sinus bradycardia, HR 56, LBBB     ASSESSMENT AND PLAN:  1. Exertional Angina:  He describes recurrent CCS class 2-3 exertional angina. I have recommended proceeding with cardiac catheterization to further evaluate his symptoms. I reviewed this with Dr. Jens Som who agreed.  Risks and benefits of cardiac catheterization have been  discussed with the patient.  These include bleeding, infection, kidney damage, stroke, heart attack, death.  The patient understands these risks and is willing to proceed.  I will place him on low-dose isosorbide 15 mg daily. He does not take PDE-5 inhibitors.  Continue aspirin and Plavix. 2. CAD:  Proceed with cardiac catheterization as noted. Continue aspirin, Plavix, statin. 3. Ischemic CM:  Continue ARB. He is not on beta blocker  therapy due to bradycardia. 4. Chronic Systolic CHF:  Volume stable. 5. Hypertension:  Controlled. 6. Hyperlipidemia:  Continue statin. 7. AAA:  Follow up US 11/2013. 8. Disposition:  Proceed with cardiac catheterization next week as planned.  Signed, Tereso Newcomer, PA-C  09/17/2013 3:32 PM

## 2013-09-24 ENCOUNTER — Telehealth: Payer: Self-pay | Admitting: Cardiology

## 2013-09-24 NOTE — Telephone Encounter (Signed)
Will forward for dr crenshaw review  

## 2013-09-24 NOTE — Telephone Encounter (Signed)
New Problem:  Pt states he had a cath yesterday and needs dental clearance for a fitting of his upper dentures on 10/30. [facility] fax #[phone] Phone #[phone]

## 2013-09-24 NOTE — Telephone Encounter (Signed)
Ok for dental cleaning but continue asa and plavix Daniel Reeves

## 2013-09-25 ENCOUNTER — Encounter: Payer: Self-pay | Admitting: *Deleted

## 2013-09-25 NOTE — Telephone Encounter (Signed)
Follow up    Had cath on Monday and dentist appt on tomorrow.  Dentist want permission to see pt tomorrow.  Pls fax approval to Dr Darrol Angel fax [phone].

## 2013-09-25 NOTE — Telephone Encounter (Signed)
Note faxed to the number provided, pt made aware.

## 2013-09-26 ENCOUNTER — Telehealth: Payer: Self-pay | Admitting: Cardiology

## 2013-09-26 NOTE — Telephone Encounter (Signed)
New proplem   Need to know if pt can have extractions

## 2013-09-26 NOTE — Telephone Encounter (Signed)
Patient should not come off ASA and plavix as had DES to SVG in May 2014. Daniel Reeves

## 2013-09-26 NOTE — Telephone Encounter (Signed)
Mindy from Dr. Darrol Angel dental office called to ask for pt's clearance for tooth extraction, dental  feeling and cleaning. Pt is to be off  Plavix and Aspirin  at Dr Ludwig Clarks discretion. Dr. Darrol Angel would like for pt to be clear for the procedure ASAP, because pt is in pain. Mindy is aware that I will send this message to Dr. Jens Som for reviewing.

## 2013-09-27 NOTE — Telephone Encounter (Signed)
Will fax this note to dr vondohlen's office at [phone].

## 2013-09-30 DIAGNOSIS — N139 Obstructive and reflux uropathy, unspecified: Secondary | ICD-10-CM | POA: Diagnosis not present

## 2013-09-30 DIAGNOSIS — R3915 Urgency of urination: Secondary | ICD-10-CM | POA: Diagnosis not present

## 2013-09-30 DIAGNOSIS — N401 Enlarged prostate with lower urinary tract symptoms: Secondary | ICD-10-CM | POA: Diagnosis not present

## 2013-10-03 ENCOUNTER — Other Ambulatory Visit: Payer: Self-pay

## 2013-10-08 ENCOUNTER — Telehealth: Payer: Self-pay | Admitting: Cardiology

## 2013-10-08 ENCOUNTER — Telehealth: Payer: Self-pay | Admitting: Pulmonary Disease

## 2013-10-08 NOTE — Telephone Encounter (Signed)
Called patient back. He states that he had a heart cath with Dr.Cooper last month and did not get any feedback as to the results. Would like a call back from Dr.Crenshaw. States that he is not having any chest pain and tolerating Imdur. Advised will pass message on to Dr.Crenshaw.

## 2013-10-08 NOTE — Telephone Encounter (Signed)
Please let pt know cath showed grafts patent including recently placed stent and make sure he has fuov Daniel Reeves

## 2013-10-08 NOTE — Telephone Encounter (Signed)
New Problem  Pt called- He asks can he receive a Flu shot// Also, before Dr. Leslee Home retired he instructed the pt to receive injections of Euflexxa in his knee... Pt asks if he is a good candidate for the procedure being that he has recently had a Cath completed.  Pt states he has had no call back about what has transpired during his cath in Oct/// He proclaims that he is curious to know what was found to make him so much better today. Please call back to discuss

## 2013-10-08 NOTE — Telephone Encounter (Signed)
I spoke with the pt and he states that he has decided to get the flu shot that SN recommended. He also states that he was supposed to get an injections in his knee and he wanted to know if we do that here. I advised we do not and that he would need to go to ortho for that. He already sees docs at [facility] so he will call them. He states he will call back to set flu shot appt once he knows when he is going to get his knee injection so he can have it all done on the same day.  Carron Curie, CMA

## 2013-10-09 ENCOUNTER — Encounter: Payer: Self-pay | Admitting: Pulmonary Disease

## 2013-10-09 ENCOUNTER — Telehealth: Payer: Self-pay | Admitting: Cardiovascular Disease

## 2013-10-09 NOTE — Telephone Encounter (Signed)
Ok for injections [facility]

## 2013-10-09 NOTE — Telephone Encounter (Signed)
Per Euflexxa website: EUFLEXXA is a highly purified form of hyaluronic acid (HA). HA is a natural substance found in the synovial fluid that cushions, lubricates, and protects a healthy knee. In people with osteoarthritis of the knee, the synovial fluid becomes thinner and less able to do its job.1 EUFLEXXA is a natural, non-bird-derived HA similar to the HA found in healthy knees. When injected into the knee, it helps replenish the HA and restore the functioning of the synovial fluid.1,2 EUFLEXXA is designed to help patients who are not getting enough relief from nonprescription pain relievers or from exercise and physical therapy.1  I will forward this message to Dr Jens Som to review this information and make a determination if the pt can be given a Euflexxa injection by [facility] orth[facility].  The pt would also like to know if he is okay to get a flu shot.

## 2013-10-09 NOTE — Telephone Encounter (Signed)
Pt aware of Dr Ludwig Clarks comments.

## 2013-10-09 NOTE — Telephone Encounter (Signed)
New Problem  Pt request cardiac clearnace to have Euflexxa injections per [facility] and a flu shot.. Please call back

## 2013-10-10 ENCOUNTER — Ambulatory Visit (INDEPENDENT_AMBULATORY_CARE_PROVIDER_SITE_OTHER): Payer: Medicare Other

## 2013-10-10 DIAGNOSIS — Z23 Encounter for immunization: Secondary | ICD-10-CM

## 2013-10-10 DIAGNOSIS — M1710 Unilateral primary osteoarthritis, unspecified knee: Secondary | ICD-10-CM | POA: Diagnosis not present

## 2013-10-10 NOTE — Telephone Encounter (Signed)
Spoke with pt, cath results discussed with pt. Follow up scheduled

## 2013-10-10 NOTE — Telephone Encounter (Signed)
Leigh, please make sure SN addresses this  I have printed the labs for him to review  Thanks!

## 2013-10-11 DIAGNOSIS — Z23 Encounter for immunization: Secondary | ICD-10-CM | POA: Diagnosis not present

## 2013-10-11 NOTE — Telephone Encounter (Signed)
Per SN: protime is normal -- chemistries are normal -- cbc is normal with a normal wbc, normal platelets and not anemic with a normal hemoglobin.  Doubt Iron, B12, or Folate are low but we can check an IBC panel, B12, Folate is he really wants [place] to.  Thanks.  Response sent to patient with the above information.

## 2013-10-12 ENCOUNTER — Encounter: Payer: Self-pay | Admitting: Pulmonary Disease

## 2013-10-14 ENCOUNTER — Other Ambulatory Visit: Payer: Self-pay | Admitting: Pulmonary Disease

## 2013-10-14 DIAGNOSIS — E538 Deficiency of other specified B group vitamins: Secondary | ICD-10-CM

## 2013-10-14 DIAGNOSIS — D649 Anemia, unspecified: Secondary | ICD-10-CM

## 2013-10-14 NOTE — Telephone Encounter (Signed)
Please advise SN thanks 

## 2013-10-16 ENCOUNTER — Telehealth: Payer: Self-pay | Admitting: Pulmonary Disease

## 2013-10-16 NOTE — Telephone Encounter (Signed)
Labs are already in EPIC. Nothing further needed and pt needed.

## 2013-10-18 ENCOUNTER — Other Ambulatory Visit (INDEPENDENT_AMBULATORY_CARE_PROVIDER_SITE_OTHER): Payer: Medicare Other

## 2013-10-18 DIAGNOSIS — E538 Deficiency of other specified B group vitamins: Secondary | ICD-10-CM | POA: Diagnosis not present

## 2013-10-18 DIAGNOSIS — D649 Anemia, unspecified: Secondary | ICD-10-CM

## 2013-10-18 LAB — CBC WITH DIFFERENTIAL/PLATELET
Basophils Absolute: 0 10*3/uL (ref 0.0–0.1)
Basophils Relative: 0.3 % (ref 0.0–3.0)
Eosinophils Absolute: 0.1 10*3/uL (ref 0.0–0.7)
Eosinophils Relative: 1.8 % (ref 0.0–5.0)
Lymphs Abs: 1.6 10*3/uL (ref 0.7–4.0)
MCHC: 33.8 g/dL (ref 30.0–36.0)
MCV: 96.7 fl (ref 78.0–100.0)
Monocytes Absolute: 0.7 10*3/uL (ref 0.1–1.0)
Neutrophils Relative %: 69 % (ref 43.0–77.0)
Platelets: 210 10*3/uL (ref 150.0–400.0)
RBC: 4.36 Mil/uL (ref 4.22–5.81)
RDW: 14.3 % (ref 11.5–14.6)

## 2013-10-18 LAB — IBC PANEL
Saturation Ratios: 51.3 % — ABNORMAL HIGH (ref 20.0–50.0)
Transferrin: 203.2 mg/dL — ABNORMAL LOW (ref 212.0–360.0)

## 2013-11-12 ENCOUNTER — Telehealth: Payer: Self-pay | Admitting: Pulmonary Disease

## 2013-11-12 NOTE — Telephone Encounter (Signed)
Called and spoke with pt and explained that i have not attempted to call pt.  Nothing further is needed per pt.

## 2013-11-19 ENCOUNTER — Telehealth: Payer: Self-pay | Admitting: Cardiology

## 2013-11-19 MED ORDER — ISOSORBIDE MONONITRATE ER 30 MG PO TB24: 30.0000 mg | ORAL_TABLET | Freq: Every morning | ORAL | Status: DC

## 2013-11-19 NOTE — Telephone Encounter (Signed)
Spoke with pt, Aware of dr crenshaw's recommendations.  °

## 2013-11-19 NOTE — Telephone Encounter (Signed)
Change imdur to 30 mg daily; fu as scheduled. Daniel Reeves'

## 2013-11-19 NOTE — Telephone Encounter (Signed)
Spoke with pt, he has noticed he is having tightness in his chest with exertion. He will get the discomfort when walking up stairs, an incline or raking leaves. He does not get rest pain and none with walking to the mailbox. He reports this is the similar to what he had prior to his last cath. the discomfort goes away quickly with rest. He has no SOB. His meds were confirmed. He has a follow up appt on 12-25-13. He is not really concerned about the discomfort but wanted [place] to know what was going on. Will forward for dr Jens Som review

## 2013-11-19 NOTE — Telephone Encounter (Signed)
New message    Pt said whatever Dr Excell Seltzer did during his cath in oct worked for a little while. However, now if he exerts himself a little--he gets chest pain/discomfort.  His next appt with Dr Jens Som is 12-25-13 at the [place] office.  He want to discuss this with the nurse. Also--pt states he will be out of town 11-30-13 thru 12-05-13. And, pt want to know if we can call him in any nose drops for his "nasel drips".

## 2013-12-11 ENCOUNTER — Ambulatory Visit: Payer: Medicare Other | Admitting: Cardiology

## 2013-12-25 ENCOUNTER — Ambulatory Visit: Payer: Medicare Other | Admitting: Cardiology

## 2013-12-31 ENCOUNTER — Other Ambulatory Visit: Payer: Self-pay | Admitting: *Deleted

## 2013-12-31 MED ORDER — SIMVASTATIN 40 MG PO TABS: 40.0000 mg | ORAL_TABLET | Freq: Every evening | ORAL | Status: DC

## 2014-01-07 DIAGNOSIS — M1710 Unilateral primary osteoarthritis, unspecified knee: Secondary | ICD-10-CM | POA: Diagnosis not present

## 2014-01-22 ENCOUNTER — Encounter: Payer: Self-pay | Admitting: Cardiology

## 2014-01-22 ENCOUNTER — Ambulatory Visit (INDEPENDENT_AMBULATORY_CARE_PROVIDER_SITE_OTHER): Payer: Medicare Other | Admitting: Cardiology

## 2014-01-22 ENCOUNTER — Ambulatory Visit: Payer: Medicare Other | Admitting: Cardiology

## 2014-01-22 ENCOUNTER — Other Ambulatory Visit: Payer: Self-pay | Admitting: Cardiology

## 2014-01-22 VITALS — BP 110/60 | HR 59 | Ht 72.0 in | Wt 171.1 lb

## 2014-01-22 DIAGNOSIS — E7800 Pure hypercholesterolemia, unspecified: Secondary | ICD-10-CM

## 2014-01-22 DIAGNOSIS — I714 Abdominal aortic aneurysm, without rupture, unspecified: Secondary | ICD-10-CM

## 2014-01-22 DIAGNOSIS — I10 Essential (primary) hypertension: Secondary | ICD-10-CM | POA: Diagnosis not present

## 2014-01-22 DIAGNOSIS — I255 Ischemic cardiomyopathy: Secondary | ICD-10-CM

## 2014-01-22 DIAGNOSIS — I2589 Other forms of chronic ischemic heart disease: Secondary | ICD-10-CM | POA: Diagnosis not present

## 2014-01-22 MED ORDER — METOPROLOL SUCCINATE ER 25 MG PO TB24: 12.5000 mg | ORAL_TABLET | Freq: Every day | ORAL | Status: DC

## 2014-01-22 NOTE — Patient Instructions (Signed)
Your physician recommends that you schedule a follow-up appointment in: 3 MONTHS WITH DR CRENSHAW  START METOPROLOL SUCC ER 25 MG TAKE ONE HALF TABLET ONCE DAILY  Your physician has requested that you have an abdominal aorta duplex. During this test, an ultrasound is used to evaluate the aorta. Allow 30 minutes for this exam. Do not eat after midnight the day before and avoid carbonated beverages

## 2014-01-22 NOTE — Assessment & Plan Note (Signed)
Schedule abdominal ultrasound for followup aneurysm.

## 2014-01-22 NOTE — Progress Notes (Signed)
HPI: FU CAD; s/p CABG in 1979 and 1993, ischemic CM, systolic CHF, AAA, HTN, HL. Abdominal US in 11/2012 showed an aneurysm of 3.3 x 3.4 cm. Followup recommended in one year. Patient underwent cardiac catheterization in May of 2014. The left main, LAD, circumflex and RCA were occluded. The LIMA to the LAD was patent and the distal LAD was occluded after the insertion. Saphenous vein graft to the obtuse marginal was occluded. Saphenous vein graft to the acute marginal and PDA had a high-grade lesion prior to insertion into the PDA of 80-90%. Ejection fraction was 25%. PCI: Promus Premier (3.5x12 mm) DES to the [facility]. Echo (09/05/13): EF 35%, diffuse HK worsened distal septal, mid/distal inferior and apical region, grade 1 diastolic dysfunction, mild LAE. Patient had repeat catheterization in October 2014 because of recurrent chest pain. The stent placed in the saphenous vein graft to the PDA had mild in-stent restenosis. Medical therapy recommended. Patient denies dyspnea on exertion, orthopnea, PND, pedal edema, chest pain or syncope.   Current Outpatient Prescriptions  Medication Sig Dispense Refill  . allopurinol (ZYLOPRIM) 300 MG tablet Take 300 mg by mouth daily with breakfast.      . aspirin EC 81 MG tablet Take 81 mg by mouth every morning.       . Cholecalciferol (VITAMIN D-3 PO) Take 2,000 Units by mouth daily with breakfast.       . clopidogrel (PLAVIX) 75 MG tablet Take 75 mg by mouth every morning.      . finasteride (PROSCAR) 5 MG tablet Take 5 mg by mouth See admin instructions. As directed      . folic acid (FOLVITE) 481 MCG tablet Take 400 mcg by mouth 2 (two) times daily.       [name] losartan (COZAAR) 50 MG tablet Take 50 mg by mouth 2 (two) times daily.      . mesalamine (LIALDA) 1.2 G EC tablet Take 1,200 mg by mouth See admin instructions. As directed      . methylcellulose (CITRUCEL) oral powder Take 1 packet by mouth daily with breakfast.      . Misc Natural Products (OSTEO  BI-FLEX ADV JOINT SHIELD) TABS Take 1 tablet by mouth 2 (two) times daily.       . Multiple Vitamins-Minerals (CENTRUM SILVER ADULT 50+ PO) Take 1 tablet by mouth daily.      . multivitamin-lutein (OCUVITE-LUTEIN) CAPS capsule Take 1 capsule by mouth every morning.       . nitroGLYCERIN (NITROSTAT) 0.4 MG SL tablet Place 1 tablet (0.4 mg total) under the tongue every 5 (five) minutes as needed. For chest pain.  25 tablet  3  . simvastatin (ZOCOR) 40 MG tablet Take 1 tablet (40 mg total) by mouth every evening.  90 tablet  1   No current facility-administered medications for this visit.     Past Medical History  Diagnosis Date  . Cerumen impaction     Bilateral  . Hypertension   . Peripheral vascular disease   . Hypercholesterolemia   . GERD (gastroesophageal reflux disease)   . Diverticulosis of colon   . Amebic dysentery   . Benign prostatic hypertrophy   . Degenerative joint disease   . Gout   . Lumbar back pain   . Anxiety   . Nephrolithiasis   . Heart murmur   . CAD (coronary artery disease)     a. s/p CABG in 1979 and 1993;  b. LHC (5/14):  LM, LAD, CFX  and RCA occluded; L-LAD ok, dLAD occluded after insertion of LIMA, S-OM occluded, S-PDA/AM 80-90 => PCI with Promus DES; EF 25%  . Chronic systolic CHF (congestive heart failure)   . Ischemic cardiomyopathy     a. echo (09/05/13): EF 35%, diffuse HK worsened distal septal, mid/distal inferior and apical region, grade 1 diastolic dysfunction, mild LAE.    [name] Abdominal aortic aneurysm     a. [place] (1/14):  3.3 x 3.4 cm => f/u 11/2013    Past Surgical History  Procedure Laterality Date  . Coronary artery bypass graft  1979    x4 SVG-DIAG-LAD, SVG-OM-PDA  . Coronary artery bypass graft  1993    Redo x5 by Dr Harlow Asa; Durward Fortes, SVG-OM, SVG-AM-PL  . Inguinal hernia repair  1994    Right by Dr Harlow Asa  . Inguinal hernia repair  1996    Left by Dr. Harlow Asa  . Decompressive laminectomy  01/2006    L2 - scarum by Dr. Shellia Carwin    . Coronary angioplasty with stent placement  04/18/2013    RCA         History   Social History  . Marital Status: Married    Spouse Name: [name] x 64 yrs    Number of Children: N/A  . Years of Education: N/A   Occupational History  . 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 Smoking status: Former Smoker    Quit date: 11/28/1944  . Smokeless tobacco: Never Used  . Alcohol Use: 7.0 oz/week    14 drink(s) per week     Comment: daily rum  or wine  . Drug Use: No  . Sexual Activity: Not Currently   Other Topics Concern  . Not on file   Social History Narrative   Married   7 children    ROS: no fevers or chills, productive cough, hemoptysis, dysphasia, odynophagia, melena, hematochezia, dysuria, hematuria, rash, seizure activity, orthopnea, PND, pedal edema, claudication. Remaining systems are negative.  Physical Exam: Well-developed well-nourished in no acute distress.  Skin is warm and dry.  HEENT is normal.  Neck is supple.  Chest is clear to auscultation with normal expansion. Previous sternotomy Cardiovascular exam is regular rate and rhythm.  Abdominal exam nontender or distended. No masses palpated. Extremities show no edema. neuro grossly intact  ECG sinus rhythm at a rate of 59. Bundle branch block.

## 2014-01-22 NOTE — Assessment & Plan Note (Addendum)
Continue ARB. I will try low-dose beta blocker. Begin Toprol 12.5 mg daily to see if he tolerates. Given age, will not pursue ICD.

## 2014-01-22 NOTE — Assessment & Plan Note (Addendum)
Continue aspirin, Plavix and statin. >30 minutes spent with patient today; he states he is not happy with communication; states he was not told about results of last cath. Was not happy that this AM OV was almost cancelled due to weather. I tried to help with his concerns.

## 2014-01-22 NOTE — Assessment & Plan Note (Signed)
Continue statin. 

## 2014-01-22 NOTE — Assessment & Plan Note (Signed)
Blood pressure controlled. Continue present medications. 

## 2014-01-27 ENCOUNTER — Ambulatory Visit (HOSPITAL_BASED_OUTPATIENT_CLINIC_OR_DEPARTMENT_OTHER)
Admission: RE | Admit: 2014-01-27 | Discharge: 2014-01-27 | Disposition: A | Discharge status: Home / Self Care | Payer: Medicare Other | Source: Ambulatory Visit | Attending: Cardiology | Admitting: Cardiology

## 2014-01-27 ENCOUNTER — Ambulatory Visit (HOSPITAL_BASED_OUTPATIENT_CLINIC_OR_DEPARTMENT_OTHER): Payer: Medicare Other

## 2014-01-27 DIAGNOSIS — I714 Abdominal aortic aneurysm, without rupture, unspecified: Secondary | ICD-10-CM | POA: Diagnosis not present

## 2014-01-30 ENCOUNTER — Other Ambulatory Visit: Payer: Self-pay | Admitting: Pulmonary Disease

## 2014-01-30 DIAGNOSIS — I10 Essential (primary) hypertension: Secondary | ICD-10-CM

## 2014-01-30 DIAGNOSIS — E7800 Pure hypercholesterolemia, unspecified: Secondary | ICD-10-CM

## 2014-01-31 ENCOUNTER — Encounter: Payer: Self-pay | Admitting: Pulmonary Disease

## 2014-02-06 ENCOUNTER — Other Ambulatory Visit: Payer: Self-pay | Admitting: *Deleted

## 2014-02-06 MED ORDER — ISOSORBIDE MONONITRATE ER 30 MG PO TB24: 30.0000 mg | ORAL_TABLET | Freq: Every day | ORAL | Status: DC

## 2014-03-03 ENCOUNTER — Ambulatory Visit: Payer: Medicare Other | Admitting: Pulmonary Disease

## 2014-03-09 ENCOUNTER — Encounter: Payer: Self-pay | Admitting: Cardiology

## 2014-03-11 ENCOUNTER — Telehealth: Payer: Self-pay | Admitting: Cardiology

## 2014-03-11 NOTE — Telephone Encounter (Signed)
Will forward for dr crenshaw review  

## 2014-03-11 NOTE — Telephone Encounter (Signed)
New problem        Vondolan DDS called from [facility] and [facility] # [phone].  They need in writing,  pt can stop Plavix proior to his extraction Faxed to [phone].    If any questions please give them a call.

## 2014-03-11 NOTE — Telephone Encounter (Signed)
OK to hold plavix prior to procedure but must continue ASA; resume plavix following procedure. Lelon Perla

## 2014-03-12 NOTE — Telephone Encounter (Signed)
Will fax this note to the number provided. 

## 2014-03-27 ENCOUNTER — Encounter: Payer: Self-pay | Admitting: Cardiology

## 2014-04-02 DIAGNOSIS — M25519 Pain in unspecified shoulder: Secondary | ICD-10-CM | POA: Diagnosis not present

## 2014-04-09 ENCOUNTER — Ambulatory Visit (INDEPENDENT_AMBULATORY_CARE_PROVIDER_SITE_OTHER): Payer: Medicare Other | Admitting: Cardiology

## 2014-04-09 ENCOUNTER — Encounter: Payer: Self-pay | Admitting: Cardiology

## 2014-04-09 VITALS — BP 138/66 | HR 54 | Ht 72.0 in | Wt 169.4 lb

## 2014-04-09 DIAGNOSIS — E7800 Pure hypercholesterolemia, unspecified: Secondary | ICD-10-CM

## 2014-04-09 DIAGNOSIS — I2510 Atherosclerotic heart disease of native coronary artery without angina pectoris: Secondary | ICD-10-CM

## 2014-04-09 DIAGNOSIS — I10 Essential (primary) hypertension: Secondary | ICD-10-CM

## 2014-04-09 LAB — BASIC METABOLIC PANEL WITH GFR
BUN: 21 mg/dL (ref 6–23)
CO2: 26 mEq/L (ref 19–32)
CREATININE: 0.97 mg/dL (ref 0.50–1.35)
Calcium: 9.3 mg/dL (ref 8.4–10.5)
Chloride: 107 mEq/L (ref 96–112)
GFR, EST AFRICAN AMERICAN: 80 mL/min
GFR, EST NON AFRICAN AMERICAN: 69 mL/min
GLUCOSE: 100 mg/dL — AB (ref 70–99)
POTASSIUM: 4.2 meq/L (ref 3.5–5.3)
Sodium: 142 mEq/L (ref 135–145)

## 2014-04-09 LAB — HEPATIC FUNCTION PANEL
ALK PHOS: 67 U/L (ref 39–117)
ALT: 22 U/L (ref 0–53)
AST: 26 U/L (ref 0–37)
Albumin: 4.3 g/dL (ref 3.5–5.2)
BILIRUBIN DIRECT: 0.2 mg/dL (ref 0.0–0.3)
Indirect Bilirubin: 0.9 mg/dL (ref 0.2–1.2)
TOTAL PROTEIN: 6.9 g/dL (ref 6.0–8.3)
Total Bilirubin: 1.1 mg/dL (ref 0.2–1.2)

## 2014-04-09 LAB — LIPID PANEL
Cholesterol: 99 mg/dL (ref 0–200)
HDL: 37 mg/dL — ABNORMAL LOW (ref >39)
LDL Cholesterol: 48 mg/dL (ref 0–99)
TRIGLYCERIDES: 70 mg/dL (ref <150)
Total CHOL/HDL Ratio: 2.7 Ratio
VLDL: 14 mg/dL (ref 0–40)

## 2014-04-09 NOTE — Assessment & Plan Note (Signed)
Continue statin. Check lipids and liver. 

## 2014-04-09 NOTE — Progress Notes (Signed)
HPI: FU CAD; s/p CABG in 1979 and 1993, ischemic CM, systolic CHF, AAA, HTN, HL. Patient underwent cardiac catheterization in May of 2014. The left main, LAD, circumflex and RCA were occluded. The LIMA to the LAD was patent and the distal LAD was occluded after the insertion. Saphenous vein graft to the obtuse marginal was occluded. Saphenous vein graft to the acute marginal and PDA had a high-grade lesion prior to insertion into the PDA of 80-90%. Ejection fraction was 25%. PCI: Promus Premier (3.5x12 mm) DES to the [facility]. Echo (09/05/13): EF 35%, diffuse HK worsened distal septal, mid/distal inferior and apical region, grade 1 diastolic dysfunction, mild LAE. Patient had repeat catheterization in October 2014 because of recurrent chest pain. The stent placed in the saphenous vein graft to the PDA had mild in-stent restenosis. Medical therapy recommended. Abdominal ultrasound in February of 2015 showed an abdominal aortic aneurysm of 3.5 x 3.4 cm. Since he was last seen, patient denies dyspnea on exertion, orthopnea, PND, pedal edema, or syncope. Occasional chest tightness with vigorous activities. This is unchanged.   Current Outpatient Prescriptions  Medication Sig Dispense Refill  . allopurinol (ZYLOPRIM) 300 MG tablet Take 300 mg by mouth daily with breakfast.      . aspirin EC 81 MG tablet Take 81 mg by mouth every morning.       . Cholecalciferol (VITAMIN D-3 PO) Take 2,000 Units by mouth daily with breakfast.       . clopidogrel (PLAVIX) 75 MG tablet Take 75 mg by mouth every morning.      . finasteride (PROSCAR) 5 MG tablet Take 5 mg by mouth See admin instructions. As directed      . folic acid (FOLVITE) 924 MCG tablet Take 400 mcg by mouth 2 (two) times daily.       . isosorbide mononitrate (IMDUR) 30 MG 24 hr tablet Take 1 tablet (30 mg total) by mouth daily.  90 tablet  1  . losartan (COZAAR) 50 MG tablet Take 50 mg by mouth 2 (two) times daily.      . mesalamine (LIALDA) 1.2 G EC  tablet Take 1,200 mg by mouth See admin instructions. As directed      . methylcellulose (CITRUCEL) oral powder Take 1 packet by mouth daily with breakfast.      . metoprolol succinate (TOPROL XL) 25 MG 24 hr tablet Take 0.5 tablets (12.5 mg total) by mouth daily.  30 tablet  12  . Misc Natural Products (OSTEO BI-FLEX ADV JOINT SHIELD) TABS Take 1 tablet by mouth 2 (two) times daily.       . Multiple Vitamins-Minerals (CENTRUM SILVER ADULT 50+ PO) Take 1 tablet by mouth daily.      . multivitamin-lutein (OCUVITE-LUTEIN) CAPS capsule Take 1 capsule by mouth every morning.       . nitroGLYCERIN (NITROSTAT) 0.4 MG SL tablet Place 1 tablet (0.4 mg total) under the tongue every 5 (five) minutes as needed. For chest pain.  25 tablet  3  . simvastatin (ZOCOR) 40 MG tablet Take 1 tablet (40 mg total) by mouth every evening.  90 tablet  1   No current facility-administered medications for this visit.     Past Medical History  Diagnosis Date  . Cerumen impaction     Bilateral  . Hypertension   . Peripheral vascular disease   . Hypercholesterolemia   . GERD (gastroesophageal reflux disease)   . Diverticulosis of colon   . Amebic dysentery   .  Benign prostatic hypertrophy   . Degenerative joint disease   . Gout   . Lumbar back pain   . Anxiety   . Nephrolithiasis   . Heart murmur   . CAD (coronary artery disease)     a. s/p CABG in 1979 and 1993;  b. LHC (5/14):  LM, LAD, CFX and RCA occluded; L-LAD ok, dLAD occluded after insertion of LIMA, S-OM occluded, S-PDA/AM 80-90 => PCI with Promus DES; EF 25%  . Chronic systolic CHF (congestive heart failure)   . Ischemic cardiomyopathy     a. echo (09/05/13): EF 35%, diffuse HK worsened distal septal, mid/distal inferior and apical region, grade 1 diastolic dysfunction, mild LAE.    [name] Abdominal aortic aneurysm     a. [place] (1/14):  3.3 x 3.4 cm => f/u 11/2013    Past Surgical History  Procedure Laterality Date  . Coronary artery bypass graft  1979      x4 SVG-DIAG-LAD, SVG-OM-PDA  . Coronary artery bypass graft  1993    Redo x5 by Dr Harlow Asa; Durward Fortes, SVG-OM, SVG-AM-PL  . Inguinal hernia repair  1994    Right by Dr Harlow Asa  . Inguinal hernia repair  1996    Left by Dr. Harlow Asa  . Decompressive laminectomy  01/2006    L2 - scarum by Dr. Shellia Carwin  . Coronary angioplasty with stent placement  04/18/2013    RCA         History   Social History  . Marital Status: Married    Spouse Name: [name] x 64 yrs    Number of Children: N/A  . Years of Education: N/A   Occupational History  . 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 Smoking status: Former Smoker    Quit date: 11/28/1944  . Smokeless tobacco: Never Used  . Alcohol Use: 7.0 oz/week    14 drink(s) per week     Comment: daily rum  or wine  . Drug Use: No  . Sexual Activity: Not Currently   Other Topics Concern  . Not on file   Social History Narrative   Married   7 children    ROS: Left shoulder pain but no fevers or chills, productive cough, hemoptysis, dysphasia, odynophagia, melena, hematochezia, dysuria, hematuria, rash, seizure activity, orthopnea, PND, pedal edema, claudication. Remaining systems are negative.  Physical Exam: Well-developed well-nourished in no acute distress.  Skin is warm and dry.  HEENT is normal.  Neck is supple.  Chest is clear to auscultation with normal expansion.  Cardiovascular exam is regular rate and rhythm.  Abdominal exam nontender or distended. No masses palpated. Extremities show no edema. neuro grossly intact  ECG Sinus rhythm, left bundle branch block.

## 2014-04-09 NOTE — Patient Instructions (Signed)
Your physician wants you to follow-up in: 6 MONTHS WITH DR CRENSHAW You will receive a reminder letter in the mail two months in advance. If you don't receive a letter, please call our office to schedule the follow-up appointment.   Your physician recommends that you HAVE LAB WORK TODAY 

## 2014-04-09 NOTE — Assessment & Plan Note (Signed)
Continue ARB and beta blocker. 

## 2014-04-09 NOTE — Assessment & Plan Note (Signed)
Blood pressure controlled. Continue present medications. Check potassium and renal function. 

## 2014-04-09 NOTE — Assessment & Plan Note (Signed)
Repeat abdominal ultrasound in February 2016.

## 2014-04-09 NOTE — Assessment & Plan Note (Signed)
Continue aspirin, Plavix and statin. 

## 2014-04-10 DIAGNOSIS — M25519 Pain in unspecified shoulder: Secondary | ICD-10-CM | POA: Diagnosis not present

## 2014-04-11 NOTE — Progress Notes (Signed)
Spoke with pt

## 2014-04-17 ENCOUNTER — Encounter: Payer: Self-pay | Admitting: Cardiology

## 2014-04-17 ENCOUNTER — Telehealth: Payer: Self-pay | Admitting: *Deleted

## 2014-04-17 DIAGNOSIS — M67919 Unspecified disorder of synovium and tendon, unspecified shoulder: Secondary | ICD-10-CM | POA: Diagnosis not present

## 2014-04-17 NOTE — Telephone Encounter (Signed)
Dr Stanford Breed spoke with dr Gladstone Lighter, pt is needing rotator cuff repair. Pt has given the okay for surgery and the okay to hold plavix for the procedure He will restart the plavix the day after the procedure

## 2014-04-20 ENCOUNTER — Encounter: Payer: Self-pay | Admitting: Cardiology

## 2014-04-28 ENCOUNTER — Encounter: Payer: Self-pay | Admitting: Cardiology

## 2014-05-10 ENCOUNTER — Encounter: Payer: Self-pay | Admitting: Cardiology

## 2014-05-14 ENCOUNTER — Other Ambulatory Visit (HOSPITAL_COMMUNITY): Payer: Self-pay | Admitting: Internal Medicine

## 2014-05-16 ENCOUNTER — Other Ambulatory Visit: Payer: Self-pay

## 2014-05-16 MED ORDER — CLOPIDOGREL BISULFATE 75 MG PO TABS: 75.0000 mg | ORAL_TABLET | Freq: Every morning | ORAL | Status: DC

## 2014-05-19 ENCOUNTER — Other Ambulatory Visit: Payer: Self-pay | Admitting: *Deleted

## 2014-05-19 MED ORDER — CLOPIDOGREL BISULFATE 75 MG PO TABS: 75.0000 mg | ORAL_TABLET | Freq: Every morning | ORAL | Status: DC

## 2014-05-27 ENCOUNTER — Ambulatory Visit (INDEPENDENT_AMBULATORY_CARE_PROVIDER_SITE_OTHER): Payer: Medicare Other | Admitting: Family Medicine

## 2014-05-27 ENCOUNTER — Encounter: Payer: Self-pay | Admitting: Family Medicine

## 2014-05-27 VITALS — BP 128/70 | HR 69 | Temp 97.8°F | Ht 72.0 in | Wt 168.0 lb

## 2014-05-27 DIAGNOSIS — E7800 Pure hypercholesterolemia, unspecified: Secondary | ICD-10-CM | POA: Diagnosis not present

## 2014-05-27 DIAGNOSIS — I10 Essential (primary) hypertension: Secondary | ICD-10-CM | POA: Diagnosis not present

## 2014-05-27 DIAGNOSIS — B019 Varicella without complication: Secondary | ICD-10-CM | POA: Insufficient documentation

## 2014-05-27 DIAGNOSIS — L578 Other skin changes due to chronic exposure to nonionizing radiation: Secondary | ICD-10-CM | POA: Diagnosis not present

## 2014-05-27 DIAGNOSIS — K219 Gastro-esophageal reflux disease without esophagitis: Secondary | ICD-10-CM

## 2014-05-27 DIAGNOSIS — B059 Measles without complication: Secondary | ICD-10-CM | POA: Insufficient documentation

## 2014-05-27 DIAGNOSIS — M109 Gout, unspecified: Secondary | ICD-10-CM

## 2014-05-27 DIAGNOSIS — B269 Mumps without complication: Secondary | ICD-10-CM | POA: Insufficient documentation

## 2014-05-27 DIAGNOSIS — I2510 Atherosclerotic heart disease of native coronary artery without angina pectoris: Secondary | ICD-10-CM

## 2014-05-27 NOTE — Progress Notes (Signed)
Pre visit review using our clinic review tool, if applicable. No additional management support is needed unless otherwise documented below in the visit note. 

## 2014-05-27 NOTE — Patient Instructions (Signed)
Cholesterol  Cholesterol is a white, waxy, fat-like protein needed by your body in small amounts. The liver makes all the cholesterol you need. Cholesterol is carried from the liver by the blood through the blood vessels. Deposits of cholesterol (plaque) may build up on blood vessel walls. These make the arteries narrower and stiffer. Cholesterol plaques increase the risk for heart attack and stroke.   You cannot feel your cholesterol level even if it is very high. The only way to know it is high is with a blood test. Once you know your cholesterol levels, you should keep a record of the test results. Work with your health care provider to keep your levels in the desired range.   WHAT DO THE RESULTS MEAN?  · Total cholesterol is a rough measure of all the cholesterol in your blood.    · LDL is the so-called bad cholesterol. This is the type that deposits cholesterol in the walls of the arteries. You want this level to be low.    · HDL is the good cholesterol because it cleans the arteries and carries the LDL away. You want this level to be high.  · Triglycerides are fat that the body can either burn for energy or store. High levels are closely linked to heart disease.    WHAT ARE THE DESIRED LEVELS OF CHOLESTEROL?  · Total cholesterol below 200.    · LDL below 100 for people at risk, below 70 for those at very high risk.    · HDL above 50 is good, above 60 is best.    · Triglycerides below 150.    HOW CAN I LOWER MY CHOLESTEROL?  · Diet. Follow your diet programs as directed by your health care provider.    ¨ Choose fish or white meat chicken and turkey, roasted or baked. Limit fatty cuts of red meat, fried foods, and processed meats, such as sausage and lunch meats.    ¨ Eat lots of fresh fruits and vegetables.  ¨ Choose whole grains, beans, pasta, potatoes, and cereals.    ¨ Use only small amounts of olive, corn, or canola oils.    ¨ Avoid butter, mayonnaise, shortening, or palm kernel oils.  ¨ Avoid foods with  trans fats.    ¨ Drink skim or nonfat milk and eat low-fat or nonfat yogurt and cheeses. Avoid whole milk, cream, ice cream, egg yolks, and full-fat cheeses.    ¨ Healthy desserts include angel food cake, ginger snaps, animal crackers, hard candy, popsicles, and low-fat or nonfat frozen yogurt. Avoid pastries, cakes, pies, and cookies.    · Exercise. Follow your exercise programs as directed by your health care provider.    ¨ A regular program helps decrease LDL and raise HDL.    ¨ A regular program helps with weight control.    ¨ Do things that increase your activity level like gardening, walking, or taking the stairs. Ask your health care provider about how you can be more active in your daily life.    · Medicine. Take medicine as directed by your health care provider.    ¨ Medicine may be prescribed by your health care provider to help lower cholesterol and decrease the risk for heart disease.    ¨ If you have several risk factors, you may need medicine even if your levels are normal.  Document Released: 08/09/2001 Document Revised: 11/19/2013 Document Reviewed: 08/28/2013  Exit[employer]® Patient Information ©2015 [employer]. This information is not intended to replace advice given to you by your health care provider. Make sure you discuss any questions you have with your health   care provider.

## 2014-05-28 ENCOUNTER — Encounter: Payer: Self-pay | Admitting: Family Medicine

## 2014-05-31 ENCOUNTER — Encounter: Payer: Self-pay | Admitting: Family Medicine

## 2014-05-31 DIAGNOSIS — L578 Other skin changes due to chronic exposure to nonionizing radiation: Secondary | ICD-10-CM

## 2014-05-31 HISTORY — DX: Other skin changes due to chronic exposure to nonionizing radiation: L57.8

## 2014-05-31 NOTE — Assessment & Plan Note (Signed)
Tolerating statin, encouraged heart healthy diet, avoid trans fats, minimize simple carbs and saturated fats. Increase exercise as tolerated 

## 2014-05-31 NOTE — Assessment & Plan Note (Signed)
Well controlled, no changes to meds. Encouraged heart healthy diet such as the DASH diet and exercise as tolerated.  °

## 2014-05-31 NOTE — Assessment & Plan Note (Signed)
Avoid offending foods, start probiotics. Do not eat large meals in late evening and consider raising head of bed.  

## 2014-05-31 NOTE — Progress Notes (Signed)
Patient ID: Daniel Reeves, male   DOB: 25-Feb-1924, 80 y.o.   MRN: [id number] Daniel Reeves [id number] [birth year] 05/31/2014      Progress Note-Follow Up  Subjective  Chief Complaint  Chief Complaint  Patient presents with  . Establish Care    transfer from [place]    HPI  Patient is a 80 year old male in today for routine medical care. She establish care with his wife. The previous physician is no longer providing primary care. He is struggling with some fatigue and weakness largely related to being a caregiver for his wife after her recent total knee replacement. He's had no acute illness. He denies any chest pain, palpitations or shortness of breath. He just notes that prior to caring for her these last few months he was admitted regular exerciser and has now not been able to. As he is started back he has not been able to exert himself the way he usually. Denies CP/palp/SOB/HA/congestion/fevers/GI or GU c/o. Taking meds as prescribed  Past Medical History  Diagnosis Date  . Cerumen impaction     Bilateral  . Hypertension   . Peripheral vascular disease   . Hypercholesterolemia   . GERD (gastroesophageal reflux disease)   . Diverticulosis of colon   . Amebic dysentery   . Benign prostatic hypertrophy   . Degenerative joint disease   . Gout   . Lumbar back pain   . Anxiety   . Nephrolithiasis   . Heart murmur   . CAD (coronary artery disease)     a. s/p CABG in 1979 and 1993;  b. LHC (5/14):  LM, LAD, CFX and RCA occluded; L-LAD ok, dLAD occluded after insertion of LIMA, S-OM occluded, S-PDA/AM 80-90 => PCI with Promus DES; EF 25%  . Chronic systolic CHF (congestive heart failure)   . Ischemic cardiomyopathy     a. echo (09/05/13): EF 35%, diffuse HK worsened distal septal, mid/distal inferior and apical region, grade 1 diastolic dysfunction, mild LAE.    [name] Abdominal aortic aneurysm     a. [place] (1/14):  3.3 x 3.4 cm => f/u 11/2013  . Chicken pox as a child  . Measles as a child   . Mumps as a child    Past Surgical History  Procedure Laterality Date  . Coronary artery bypass graft  1979    x4 SVG-DIAG-LAD, SVG-OM-PDA  . Coronary artery bypass graft  1993    Redo x5 by Dr Harlow Asa; Durward Fortes, SVG-OM, SVG-AM-PL  . Inguinal hernia repair  1994    Right by Dr Harlow Asa  . Inguinal hernia repair  1996    Left by Dr. Harlow Asa  . Decompressive laminectomy  01/2006    L2 - scarum by Dr. Shellia Carwin  . Coronary angioplasty with stent placement  04/18/2013    RCA       . Hemorrhoid surgery      fissure with hemorrhoid corrected at age 42  . Tonsillectomy      Family History  Problem Relation Age of Onset  . Parkinsonism Brother   . Diabetes Maternal Grandmother   . Depression Daughter   . Other Son     4 stents  . Heart disease Son   . Diabetes Son     type 2    History   Social History  . Marital Status: Married    Spouse Name: [name] x 64 yrs    Number of Children: N/A  . Years of Education: N/A   Occupational  History  . 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 Smoking status: Former Smoker    Quit date: 11/28/1944  . Smokeless tobacco: Never Used  . Alcohol Use: 7.0 oz/week    14 drink(s) per week     Comment: daily rum  or wine  . Drug Use: No  . Sexual Activity: Not Currently     Comment: lives with wife, no dietary restrictions.    Other Topics Concern  . Not on file   Social History Narrative   Married   7 children    Current Outpatient Prescriptions on File Prior to Visit  Medication Sig Dispense Refill  . allopurinol (ZYLOPRIM) 300 MG tablet Take 300 mg by mouth daily with breakfast.      . aspirin EC 81 MG tablet Take 81 mg by mouth every morning.       . Cholecalciferol (VITAMIN D-3 PO) Take 2,000 Units by mouth daily with breakfast.       . clopidogrel (PLAVIX) 75 MG tablet Take 1 tablet (75 mg total) by mouth every morning.  90 tablet  3  . finasteride (PROSCAR) 5 MG tablet Take 5 mg by  mouth See admin instructions. As directed      . folic acid (FOLVITE) 847 MCG tablet Take 400 mcg by mouth 2 (two) times daily.       . isosorbide mononitrate (IMDUR) 30 MG 24 hr tablet Take 1 tablet (30 mg total) by mouth daily.  90 tablet  1  . losartan (COZAAR) 50 MG tablet Take 50 mg by mouth 2 (two) times daily.      . methylcellulose (CITRUCEL) oral powder Take 1 packet by mouth daily with breakfast.      . Misc Natural Products (OSTEO BI-FLEX ADV JOINT SHIELD) TABS Take 1 tablet by mouth 2 (two) times daily.       . Multiple Vitamins-Minerals (CENTRUM SILVER ADULT 50+ PO) Take 1 tablet by mouth daily.      . multivitamin-lutein (OCUVITE-LUTEIN) CAPS capsule Take 1 capsule by mouth every morning.       . nitroGLYCERIN (NITROSTAT) 0.4 MG SL tablet Place 1 tablet (0.4 mg total) under the tongue every 5 (five) minutes as needed. For chest pain.  25 tablet  3  . simvastatin (ZOCOR) 40 MG tablet Take 1 tablet (40 mg total) by mouth every evening.  90 tablet  1   No current facility-administered medications on file prior to visit.    Allergies  Allergen Reactions  . Lisinopril     REACTION: dizziness  . Methocarbamol     REACTION: pt states "dizzy"  . Pregabalin     REACTION: pt states "dizzy"  . Ramipril     REACTION: hives and dizziness    Review of Systems  Review of Systems  Constitutional: Negative for fever and malaise/fatigue.  HENT: Negative for congestion.   Eyes: Negative for discharge.  Respiratory: Negative for shortness of breath.   Cardiovascular: Negative for chest pain, palpitations and leg swelling.  Gastrointestinal: Negative for nausea, abdominal pain and diarrhea.  Genitourinary: Negative for dysuria.  Musculoskeletal: Negative for falls.  Skin: Negative for rash.  Neurological: Negative for loss of consciousness and headaches.  Endo/Heme/Allergies: Negative for polydipsia.  Psychiatric/Behavioral: Negative for depression and suicidal ideas. The patient is  not nervous/anxious and does not have insomnia.     Objective  BP 128/70  Pulse 69  Temp(Src) 97.8 F (36.6 C) (  Oral)  Ht 6' (1.829 m)  Wt 168 lb (76.204 kg)  BMI 22.78 kg/m2  SpO2 97%  Physical Exam  Physical Exam  Constitutional: He is oriented to person, place, and time and well-developed, well-nourished, and in no distress. No distress.  HENT:  Head: Normocephalic and atraumatic.  Eyes: Conjunctivae are normal.  Neck: Neck supple. No thyromegaly present.  Cardiovascular: Normal rate, regular rhythm and normal heart sounds.   No murmur heard. Pulmonary/Chest: Effort normal and breath sounds normal. No respiratory distress.  Abdominal: He exhibits no distension and no mass. There is no tenderness.  Musculoskeletal: He exhibits no edema.  Neurological: He is alert and oriented to person, place, and time.  Skin: Skin is warm.  Psychiatric: Memory, affect and judgment normal.    Lab Results  Component Value Date   TSH 2.34 03/19/2013   Lab Results  Component Value Date   WBC 7.8 10/18/2013   HGB 14.3 10/18/2013   HCT 42.2 10/18/2013   MCV 96.7 10/18/2013   PLT 210.0 10/18/2013   Lab Results  Component Value Date   CREATININE 0.97 04/09/2014   BUN 21 04/09/2014   NA 142 04/09/2014   K 4.2 04/09/2014   CL 107 04/09/2014   CO2 26 04/09/2014   Lab Results  Component Value Date   ALT 22 04/09/2014   AST 26 04/09/2014   ALKPHOS 67 04/09/2014   BILITOT 1.1 04/09/2014   Lab Results  Component Value Date   CHOL 99 04/09/2014   Lab Results  Component Value Date   HDL 37* 04/09/2014   Lab Results  Component Value Date   LDLCALC 48 04/09/2014   Lab Results  Component Value Date   TRIG 70 04/09/2014   Lab Results  Component Value Date   CHOLHDL 2.7 04/09/2014     Assessment & Plan  HYPERTENSION Well controlled, no changes to meds. Encouraged heart healthy diet such as the DASH diet and exercise as tolerated.   GERD Avoid offending foods, start probiotics. Do  not eat large meals in late evening and consider raising head of bed.   HYPERCHOLESTEROLEMIA Tolerating statin, encouraged heart healthy diet, avoid trans fats, minimize simple carbs and saturated fats. Increase exercise as tolerated  Sun-damaged skin Referred to dermatology for further consideration  GOUT No recent flares, well controlled on Allopurinol

## 2014-05-31 NOTE — Assessment & Plan Note (Signed)
Referred to dermatology for further consideration.  

## 2014-05-31 NOTE — Assessment & Plan Note (Signed)
No recent flares, well controlled on Allopurinol

## 2014-06-02 ENCOUNTER — Telehealth: Payer: Self-pay

## 2014-06-02 NOTE — Telephone Encounter (Signed)
Per md add Vitamin B1 to patients medlist

## 2014-06-05 DIAGNOSIS — H353190 Nonexudative age-related macular degeneration, unspecified eye, stage unspecified: Secondary | ICD-10-CM | POA: Diagnosis not present

## 2014-06-05 DIAGNOSIS — Z961 Presence of intraocular lens: Secondary | ICD-10-CM | POA: Diagnosis not present

## 2014-06-10 ENCOUNTER — Encounter: Payer: Self-pay | Admitting: Family Medicine

## 2014-06-11 MED ORDER — ALLOPURINOL 300 MG PO TABS: 300.0000 mg | ORAL_TABLET | Freq: Every day | ORAL | Status: DC

## 2014-07-22 DIAGNOSIS — L570 Actinic keratosis: Secondary | ICD-10-CM | POA: Diagnosis not present

## 2014-07-22 DIAGNOSIS — L821 Other seborrheic keratosis: Secondary | ICD-10-CM | POA: Diagnosis not present

## 2014-07-29 ENCOUNTER — Other Ambulatory Visit: Payer: Self-pay | Admitting: Medical

## 2014-07-29 ENCOUNTER — Ambulatory Visit (INDEPENDENT_AMBULATORY_CARE_PROVIDER_SITE_OTHER): Payer: Medicare Other | Admitting: Medical

## 2014-07-29 ENCOUNTER — Encounter: Payer: Self-pay | Admitting: Medical

## 2014-07-29 ENCOUNTER — Ambulatory Visit (HOSPITAL_BASED_OUTPATIENT_CLINIC_OR_DEPARTMENT_OTHER)
Admission: RE | Admit: 2014-07-29 | Discharge: 2014-07-29 | Disposition: A | Discharge status: Home / Self Care | Payer: Medicare Other | Source: Ambulatory Visit | Attending: Medical | Admitting: Medical

## 2014-07-29 VITALS — BP 110/68 | HR 68 | Temp 98.3°F | Ht 72.0 in | Wt 166.8 lb

## 2014-07-29 DIAGNOSIS — M25562 Pain in left knee: Secondary | ICD-10-CM

## 2014-07-29 DIAGNOSIS — M25569 Pain in unspecified knee: Secondary | ICD-10-CM | POA: Diagnosis not present

## 2014-07-29 DIAGNOSIS — B029 Zoster without complications: Secondary | ICD-10-CM | POA: Insufficient documentation

## 2014-07-29 DIAGNOSIS — M25559 Pain in unspecified hip: Secondary | ICD-10-CM | POA: Insufficient documentation

## 2014-07-29 DIAGNOSIS — I2510 Atherosclerotic heart disease of native coronary artery without angina pectoris: Secondary | ICD-10-CM | POA: Diagnosis not present

## 2014-07-29 DIAGNOSIS — M1710 Unilateral primary osteoarthritis, unspecified knee: Secondary | ICD-10-CM | POA: Insufficient documentation

## 2014-07-29 DIAGNOSIS — M169 Osteoarthritis of hip, unspecified: Secondary | ICD-10-CM | POA: Insufficient documentation

## 2014-07-29 DIAGNOSIS — M25552 Pain in left hip: Secondary | ICD-10-CM

## 2014-07-29 DIAGNOSIS — IMO0002 Reserved for concepts with insufficient information to code with codable children: Secondary | ICD-10-CM | POA: Insufficient documentation

## 2014-07-29 DIAGNOSIS — M1610 Unilateral primary osteoarthritis, unspecified hip: Secondary | ICD-10-CM | POA: Insufficient documentation

## 2014-07-29 HISTORY — DX: Zoster without complications: B02.9

## 2014-07-29 HISTORY — DX: Pain in unspecified knee: M25.569

## 2014-07-29 MED ORDER — ACYCLOVIR 400 MG PO TABS: 400.0000 mg | ORAL_TABLET | Freq: Every day | ORAL | Status: DC

## 2014-07-29 NOTE — Progress Notes (Signed)
Subjective:    Patient ID: Daniel Reeves, male    DOB: August 04, 1924, 80 y.o.   MRN: [id number]  HPI  Pt in with some left lower rib region pain and he actually points more to lt upper abdomen. 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 His belt may be rubbing against area. Pt concerned because he is going on vacation in next month or so. So he wants to not have the pain while on vacation. No nausea, no vomiting. No fever, no chills. No back pain. No rash(That he has seen). Pt states  not obvious worse with movement. Mild last 2 weeks. More prominent pain today.(He was not aware of dermatomal pattern). Some pain this am but not reporting pain presently.  Pt also notes lateral aspect of left leg has pain when for 3-4 weeks. He notices pain worse while he is walking his dog. Pt walks about a half a mile. Walking up a hill and down a hill. 7 years ago prior orthopedist wanted to do knee replacement and he declined. Pt has level 3-4/10 pain. Only pain when walks dog. No pain now.  Pt has history of cabg and 2 stents. Pt new cardiologist is Crenshaw.     Review of Systems  Constitutional: Negative for fever, chills and fatigue.  HENT: Negative.   Respiratory: Negative for cough, shortness of breath and wheezing.   Cardiovascular: Negative for chest pain and palpitations.  Gastrointestinal: Positive for abdominal pain. Negative for nausea, vomiting, constipation, blood in stool, abdominal distention, anal bleeding and rectal pain.       Lt upper quadrant/lt upper abdomen region pain. Faint but not deep. Worse this am not present now.  Genitourinary: Negative.   Musculoskeletal: Negative for arthralgias, back pain, myalgias, neck pain and neck stiffness.  Skin: Positive for rash.       I showed pt rash from his lt rib/luq region that was in dermatomal pattern. Pt did not notice the rash.  Neurological: Negative for dizziness, syncope, facial asymmetry,  weakness, light-headedness and numbness.  Hematological: Negative for adenopathy. Does not bruise/bleed easily.  Psychiatric/Behavioral: Negative.        Objective:   Physical Exam  Constitutional: He is oriented to person, place, and time. He appears well-developed and well-nourished. No distress.  HENT:  Head: Normocephalic and atraumatic.  Neck: Normal range of motion. Neck supple. No JVD present. No tracheal deviation present. No thyromegaly present.  Cardiovascular: Normal rate, regular rhythm and normal heart sounds.   Pulmonary/Chest: Effort normal and breath sounds normal. No stridor. No respiratory distress. He has no wheezes. He has no rales. He exhibits no tenderness.  Abdominal: Soft. Bowel sounds are normal. He exhibits no distension and no mass. There is no rebound and no guarding.  Musculoskeletal:  No pain on palation of lower left ribs. And no pain on palpation of anterior thorax/chest.   Lt hip- from no pain. No pain on palpation. No creptius. Lt knee- on flexion and extension no pain. Mild crepitus.  Lt lateral thigh- no pain on palpation directly.  Lymphadenopathy:    He has no cervical adenopathy.  Neurological: He is alert and oriented to person, place, and time. No cranial nerve deficit.  Skin: Skin is warm. He is not diaphoretic.  Scattered rash luq/left lower rib area that follows dermatomal pattern all the way to mid thoracic area. One small vesicle in left upper quadrant region.  Psychiatric: He has a normal mood  and affect. His behavior is normal. Judgment and thought content normal.           Assessment & Plan:

## 2014-07-29 NOTE — Assessment & Plan Note (Signed)
Patient does not have classic shingles-type pain. However on exam he does have dermatomal distribution and one vesicle seen. Therefore we'll go ahead and treat with acyclovir 800 mg 5 times a day x7 days. Pharmacist did call me back to clarify instruction. Patient declines any pain medication but if he changes his mind would likely give him medication such as tramadol.

## 2014-07-29 NOTE — Patient Instructions (Signed)
I think your mild abdominal pain/flank pain may be from atypical shingles presentation. Largely based on characteristic distrubution of th the rash wrapping around your left thorax. When rash is seen it best to treat early. So start acyclovir today. If your pain worsens or changes notify us. If changes such as chest pain or deep abdominal pain then ED evaluation.   Regarding your leg pain lateral aspect walking your dog use low dose ibuprofen(200 mg) or tylenol( 325 mg)  if needed. We will get xray of lt hip and knee today. Please keep in mind there is some soft  tissue that also could be source of pain.  Follow up in 10-14 days or as needed.

## 2014-07-29 NOTE — Assessment & Plan Note (Signed)
Pain is described from the hip to the left knee lateral aspect. Will get x-ray of the left hip and knee today. His pain is only on walking his daughter. He declines any medication. If he has significant pain he is willing to take Tylenol 325 mg over-the-counter or he can take ibuprofen 200 mg. Will notify patient of x-ray results when results are in.

## 2014-07-30 ENCOUNTER — Telehealth: Payer: Self-pay | Admitting: Medical

## 2014-07-30 NOTE — Telephone Encounter (Signed)
Caller name: Chevy  Relation to pt: self  Call back number: [phone] Pharmacy: [facility]   Reason for call: pt in need of clarification of direction acyclovir (ZOVIRAX) 400 MG tablet

## 2014-08-03 ENCOUNTER — Encounter: Payer: Self-pay | Admitting: Family Medicine

## 2014-08-06 ENCOUNTER — Telehealth: Payer: Self-pay | Admitting: Family Medicine

## 2014-08-06 NOTE — Telephone Encounter (Signed)
Caller name: Javaun Relation to pt: self Call back number: [phone] Pharmacy:  Reason for call:   Patient last saw Percell Miller and states that he has finished taking acyclovir and states that he still has blisters and sores. He states that he is still in pain and is itchy.

## 2014-08-08 NOTE — Telephone Encounter (Signed)
Called pt again today and line was busy.

## 2014-08-08 NOTE — Telephone Encounter (Signed)
I called pt twice today. No answer pt. So unable to leave a message. Phone rang twice then would go to static?

## 2014-08-09 NOTE — Telephone Encounter (Signed)
Would you call pt and see how he is? He left a message on the 9th about blisters and itching. I called him twice on the 11th and no answer.  I would like to see area again or could refer him to dermatologist. Also, he had message that was not urgent about corrections that need to be changed on hism medf list. This went to me and I accidentally somehow reviewed this/deleted it from my screen. But I did see his message when I made this note. Would you make those corrections on his list.

## 2014-08-11 NOTE — Telephone Encounter (Signed)
Called patient unable to leave message on anseweriing mach .

## 2014-08-12 NOTE — Telephone Encounter (Signed)
Called patient back. Wife states he was  Outside and she was not well enough to get him.Askd if I could call back. Agreed.

## 2014-08-12 NOTE — Telephone Encounter (Signed)
Called patient back, line busy.

## 2014-08-24 ENCOUNTER — Encounter: Payer: Self-pay | Admitting: Family Medicine

## 2014-09-02 NOTE — Addendum Note (Signed)
Addended by: Murtis Sink A on: 09/02/2014 09:16 AM   Modules accepted: Orders

## 2014-09-09 ENCOUNTER — Encounter: Payer: Self-pay | Admitting: Family Medicine

## 2014-09-09 MED ORDER — SIMVASTATIN 40 MG PO TABS: 40.0000 mg | ORAL_TABLET | Freq: Every evening | ORAL | Status: DC

## 2014-10-01 DIAGNOSIS — R3915 Urgency of urination: Secondary | ICD-10-CM | POA: Diagnosis not present

## 2014-10-01 DIAGNOSIS — N401 Enlarged prostate with lower urinary tract symptoms: Secondary | ICD-10-CM | POA: Diagnosis not present

## 2014-10-07 NOTE — Progress Notes (Signed)
HPI: FU CAD; s/p CABG in 1979 and 1993, ischemic CM, systolic CHF, AAA, HTN, HL. Patient underwent cardiac catheterization in May of 2014. The left main, LAD, circumflex and RCA were occluded. The LIMA to the LAD was patent and the distal LAD was occluded after the insertion. Saphenous vein graft to the obtuse marginal was occluded. Saphenous vein graft to the acute marginal and PDA had a high-grade lesion prior to insertion into the PDA of 80-90%. Ejection fraction was 25%. PCI: Promus Premier (3.5x12 mm) DES to the [facility]. Echo (09/05/13): EF 35%, diffuse HK worsened distal septal, mid/distal inferior and apical region, grade 1 diastolic dysfunction, mild LAE. Patient had repeat catheterization in October 2014 because of recurrent chest pain. The stent placed in the saphenous vein graft to the PDA had mild in-stent restenosis. Medical therapy recommended. Abdominal ultrasound in February of 2015 showed an abdominal aortic aneurysm of 3.5 x 3.4 cm. Since he was last seen, He has mild dyspnea on exertion but no orthopnea, PND, pedal edema, palpitations, syncope or chest pain.  Current Outpatient Prescriptions  Medication Sig Dispense Refill  . allopurinol (ZYLOPRIM) 300 MG tablet Take 1 tablet (300 mg total) by mouth daily with breakfast. 90 tablet 1  . aspirin EC 81 MG tablet Take 81 mg by mouth every morning.     . Cholecalciferol (VITAMIN D-3 PO) Take 2,000 Units by mouth daily with breakfast.     . clopidogrel (PLAVIX) 75 MG tablet Take 1 tablet (75 mg total) by mouth every morning. 90 tablet 3  . cyanocobalamin 2000 MCG tablet Take 2,000 mcg by mouth daily.    . finasteride (PROSCAR) 5 MG tablet Take 5 mg by mouth See admin instructions. As directed    . folic acid (FOLVITE) 937 MCG tablet Take 400 mcg by mouth 2 (two) times daily.     . isosorbide mononitrate (IMDUR) 30 MG 24 hr tablet Take 1 tablet (30 mg total) by mouth daily. 90 tablet 1  . losartan (COZAAR) 50 MG tablet Take 50 mg by  mouth 2 (two) times daily.    . methylcellulose (CITRUCEL) oral powder Take 1 packet by mouth daily with breakfast.    . Misc Natural Products (OSTEO BI-FLEX ADV JOINT SHIELD) TABS Take 1 tablet by mouth 2 (two) times daily.     . Multiple Vitamins-Minerals (CENTRUM SILVER ADULT 50+ PO) Take 1 tablet by mouth daily. Every other day    . multivitamin-lutein (OCUVITE-LUTEIN) CAPS capsule Take 1 capsule by mouth every morning.     . nitroGLYCERIN (NITROSTAT) 0.4 MG SL tablet Place 1 tablet (0.4 mg total) under the tongue every 5 (five) minutes as needed. For chest pain. 25 tablet 3  . simvastatin (ZOCOR) 40 MG tablet Take 1 tablet (40 mg total) by mouth every evening. 90 tablet 1   No current facility-administered medications for this visit.     Past Medical History  Diagnosis Date  . Cerumen impaction     Bilateral  . Hypertension   . Peripheral vascular disease   . Hypercholesterolemia   . GERD (gastroesophageal reflux disease)   . Diverticulosis of colon   . Amebic dysentery   . Benign prostatic hypertrophy   . Degenerative joint disease   . Gout   . Lumbar back pain   . Anxiety   . Nephrolithiasis   . Heart murmur   . CAD (coronary artery disease)     a. s/p CABG in 1979 and 1993;  b.  LHC (5/14):  LM, LAD, CFX and RCA occluded; L-LAD ok, dLAD occluded after insertion of LIMA, S-OM occluded, S-PDA/AM 80-90 => PCI with Promus DES; EF 25%  . Chronic systolic CHF (congestive heart failure)   . Ischemic cardiomyopathy     a. echo (09/05/13): EF 35%, diffuse HK worsened distal septal, mid/distal inferior and apical region, grade 1 diastolic dysfunction, mild LAE.    [name] Abdominal aortic aneurysm     a. [place] (1/14):  3.3 x 3.4 cm => f/u 11/2013  . Chicken pox as a child  . Measles as a child  . Mumps as a child    Past Surgical History  Procedure Laterality Date  . Coronary artery bypass graft  1979    x4 SVG-DIAG-LAD, SVG-OM-PDA  . Coronary artery bypass graft  1993    Redo x5 by  Dr Harlow Asa; Durward Fortes, SVG-OM, SVG-AM-PL  . Inguinal hernia repair  1994    Right by Dr Harlow Asa  . Inguinal hernia repair  1996    Left by Dr. Harlow Asa  . Decompressive laminectomy  01/2006    L2 - scarum by Dr. Shellia Carwin  . Coronary angioplasty with stent placement  04/18/2013    RCA       . Hemorrhoid surgery      fissure with hemorrhoid corrected at age 82  . Tonsillectomy      History   Social History  . Marital Status: Married    Spouse Name: [name] x 64 yrs    Number of Children: N/A  . Years of Education: N/A   Occupational History  . 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 Smoking status: Former Smoker    Quit date: 11/28/1944  . Smokeless tobacco: Never Used  . Alcohol Use: 7.0 oz/week    14 drink(s) per week     Comment: daily rum  or wine  . Drug Use: No  . Sexual Activity: Not Currently     Comment: lives with wife, no dietary restrictions.    Other Topics Concern  . Not on file   Social History Narrative   Married   7 children    ROS: Some problems with weakness in his legs and pain in left lower extremity but no fevers or chills, productive cough, hemoptysis, dysphasia, odynophagia, melena, hematochezia, dysuria, hematuria, rash, seizure activity, orthopnea, PND, pedal edema, claudication. Remaining systems are negative.  Physical Exam: Well-developed well-nourished in no acute distress.  Skin is warm and dry.  HEENT is normal.  Neck is supple.  Chest is clear to auscultation with normal expansion.  Cardiovascular exam is regular rate and rhythm.  Abdominal exam nontender or distended. No masses palpated. Extremities show no edema. neuro grossly intact  ECG Sinus bradycardia, left bundle branch block.

## 2014-10-08 ENCOUNTER — Telehealth: Payer: Self-pay | Admitting: Cardiology

## 2014-10-08 ENCOUNTER — Encounter: Payer: Self-pay | Admitting: Cardiology

## 2014-10-08 ENCOUNTER — Ambulatory Visit (INDEPENDENT_AMBULATORY_CARE_PROVIDER_SITE_OTHER): Payer: Medicare Other | Admitting: Cardiology

## 2014-10-08 VITALS — BP 110/60 | HR 55 | Ht 72.0 in | Wt 169.4 lb

## 2014-10-08 DIAGNOSIS — I25700 Atherosclerosis of coronary artery bypass graft(s), unspecified, with unstable angina pectoris: Secondary | ICD-10-CM | POA: Diagnosis not present

## 2014-10-08 DIAGNOSIS — I714 Abdominal aortic aneurysm, without rupture, unspecified: Secondary | ICD-10-CM

## 2014-10-08 DIAGNOSIS — I2510 Atherosclerotic heart disease of native coronary artery without angina pectoris: Secondary | ICD-10-CM

## 2014-10-08 MED ORDER — METOPROLOL SUCCINATE ER 25 MG PO TB24: 12.5000 mg | ORAL_TABLET | Freq: Every day | ORAL | Status: DC

## 2014-10-08 MED ORDER — LOSARTAN POTASSIUM 50 MG PO TABS: 50.0000 mg | ORAL_TABLET | Freq: Every day | ORAL | Status: DC

## 2014-10-08 NOTE — Telephone Encounter (Signed)
Pt called in stating that he was just in to see Dr. Stanford Breed and he was told to take Folic Acid bid 570 mcg. He states that he already has a bottle of 880mcg and would like to know if it is ok for him to take one of those a day. Please call  Thanks

## 2014-10-08 NOTE — Patient Instructions (Signed)
Your physician wants you to follow-up in: [place] will receive a reminder letter in the mail two months in advance. If you don't receive a letter, please call our office to schedule the follow-up appointment.   DECREASE LOSARTAN TO 50 MG ONCE DAILY  START METOPROLOL SUCC ER 12.5 MG ONCE DAILY AT BEDTIME= 1/2 OF 25 MG TABLET ONCE DAILY  Your physician has requested that you have an abdominal aorta duplex. During this test, an ultrasound is used to evaluate the aorta. Allow 30 minutes for this exam. Do not eat after midnight the day before and avoid carbonated beverages  DUE IN FEB

## 2014-10-08 NOTE — Assessment & Plan Note (Signed)
Plan to decrease Cozaar to 50 mg once daily. Add Toprol 12.5 mg daily. Follow blood pressure and heart rate closely.

## 2014-10-08 NOTE — Assessment & Plan Note (Signed)
Continue aspirin, Plavix and statin. 

## 2014-10-08 NOTE — Assessment & Plan Note (Signed)
Plan repeat abdominal ultrasound February 2016.

## 2014-10-08 NOTE — Assessment & Plan Note (Signed)
Blood pressure controlled. Continue present medications. 

## 2014-10-08 NOTE — Assessment & Plan Note (Signed)
Continue statin. 

## 2014-10-08 NOTE — Telephone Encounter (Signed)
Spoke with pt, aware we usually do not give folic acid for the heart. Patient is going to check with his primary care doctor

## 2014-10-14 ENCOUNTER — Encounter: Payer: Self-pay | Admitting: Cardiology

## 2014-11-06 ENCOUNTER — Encounter (HOSPITAL_COMMUNITY): Payer: Self-pay | Admitting: Cardiovascular Disease

## 2014-11-12 ENCOUNTER — Other Ambulatory Visit: Payer: Medicare Other

## 2014-11-12 DIAGNOSIS — L570 Actinic keratosis: Secondary | ICD-10-CM | POA: Diagnosis not present

## 2014-11-24 ENCOUNTER — Encounter: Payer: Self-pay | Admitting: Family Medicine

## 2014-11-24 ENCOUNTER — Ambulatory Visit (INDEPENDENT_AMBULATORY_CARE_PROVIDER_SITE_OTHER): Payer: Medicare Other | Admitting: *Deleted

## 2014-11-24 ENCOUNTER — Ambulatory Visit (INDEPENDENT_AMBULATORY_CARE_PROVIDER_SITE_OTHER): Payer: Medicare Other | Admitting: Family Medicine

## 2014-11-24 VITALS — BP 126/59 | HR 51 | Temp 98.1°F | Ht 71.0 in | Wt 172.2 lb

## 2014-11-24 DIAGNOSIS — I2510 Atherosclerotic heart disease of native coronary artery without angina pectoris: Secondary | ICD-10-CM

## 2014-11-24 DIAGNOSIS — Z23 Encounter for immunization: Secondary | ICD-10-CM

## 2014-11-24 DIAGNOSIS — Z0000 Encounter for general adult medical examination without abnormal findings: Secondary | ICD-10-CM | POA: Insufficient documentation

## 2014-11-24 DIAGNOSIS — H9190 Unspecified hearing loss, unspecified ear: Secondary | ICD-10-CM | POA: Diagnosis not present

## 2014-11-24 DIAGNOSIS — E782 Mixed hyperlipidemia: Secondary | ICD-10-CM

## 2014-11-24 DIAGNOSIS — M10079 Idiopathic gout, unspecified ankle and foot: Secondary | ICD-10-CM | POA: Diagnosis not present

## 2014-11-24 DIAGNOSIS — I25810 Atherosclerosis of coronary artery bypass graft(s) without angina pectoris: Secondary | ICD-10-CM

## 2014-11-24 DIAGNOSIS — I10 Essential (primary) hypertension: Secondary | ICD-10-CM | POA: Diagnosis not present

## 2014-11-24 DIAGNOSIS — K219 Gastro-esophageal reflux disease without esophagitis: Secondary | ICD-10-CM | POA: Diagnosis not present

## 2014-11-24 DIAGNOSIS — M545 Low back pain: Secondary | ICD-10-CM | POA: Diagnosis not present

## 2014-11-24 DIAGNOSIS — E7800 Pure hypercholesterolemia, unspecified: Secondary | ICD-10-CM

## 2014-11-24 HISTORY — DX: Encounter for general adult medical examination without abnormal findings: Z00.00

## 2014-11-24 HISTORY — DX: Unspecified hearing loss, unspecified ear: H91.90

## 2014-11-24 LAB — CBC
HCT: 41.7 % (ref 39.0–52.0)
Hemoglobin: 13.7 g/dL (ref 13.0–17.0)
MCHC: 32.8 g/dL (ref 30.0–36.0)
MCV: 99.6 fl (ref 78.0–100.0)
Platelets: 221 10*3/uL (ref 150.0–400.0)
RBC: 4.19 Mil/uL — ABNORMAL LOW (ref 4.22–5.81)
RDW: 14.1 % (ref 11.5–15.5)
WBC: 7.5 10*3/uL (ref 4.0–10.5)

## 2014-11-24 LAB — COMPLETE METABOLIC PANEL WITH GFR
ALK PHOS: 70 U/L (ref 39–117)
ALT: 21 U/L (ref 0–53)
AST: 26 U/L (ref 0–37)
Albumin: 4.1 g/dL (ref 3.5–5.2)
BUN: 22 mg/dL (ref 6–23)
CHLORIDE: 106 meq/L (ref 96–112)
CO2: 25 mEq/L (ref 19–32)
CREATININE: 1.11 mg/dL (ref 0.50–1.35)
Calcium: 9.4 mg/dL (ref 8.4–10.5)
GFR, Est African American: 68 mL/min
GFR, Est Non African American: 59 mL/min — ABNORMAL LOW
Glucose, Bld: 92 mg/dL (ref 70–99)
Potassium: 4.5 mEq/L (ref 3.5–5.3)
Sodium: 141 mEq/L (ref 135–145)
TOTAL PROTEIN: 6.5 g/dL (ref 6.0–8.3)
Total Bilirubin: 1 mg/dL (ref 0.2–1.2)

## 2014-11-24 LAB — LIPID PANEL
CHOL/HDL RATIO: 3
Cholesterol: 114 mg/dL (ref 0–200)
HDL: 35.4 mg/dL — AB (ref >39.00)
LDL CALC: 56 mg/dL (ref 0–99)
NonHDL: 78.6
TRIGLYCERIDES: 113 mg/dL (ref 0.0–149.0)
VLDL: 22.6 mg/dL (ref 0.0–40.0)

## 2014-11-24 LAB — TSH: TSH: 2.21 u[IU]/mL (ref 0.35–4.50)

## 2014-11-24 LAB — URIC ACID: Uric Acid, Serum: 4.5 mg/dL (ref 4.0–7.8)

## 2014-11-24 NOTE — Assessment & Plan Note (Signed)
Tolerating statin, encouraged heart healthy diet, avoid trans fats, minimize simple carbs and saturated fats. Increase exercise as tolerated 

## 2014-11-24 NOTE — Assessment & Plan Note (Signed)
Failed whisper test at 5 feet encouraged to go for audiology evaluation. No wax noted today

## 2014-11-24 NOTE — Patient Instructions (Signed)
Preventive Care for Adults A healthy lifestyle and preventive care can promote health and wellness. Preventive health guidelines for men include the following key practices:  A routine yearly physical is a good way to check with your health care provider about your health and preventative screening. It is a chance to share any concerns and updates on your health and to receive a thorough exam.  Visit your dentist for a routine exam and preventative care every 6 months. Brush your teeth twice a day and floss once a day. Good oral hygiene prevents tooth decay and gum disease.  The frequency of eye exams is based on your age, health, family medical history, use of contact lenses, and other factors. Follow your health care provider's recommendations for frequency of eye exams.  Eat a healthy diet. Foods such as vegetables, fruits, whole grains, low-fat dairy products, and lean protein foods contain the nutrients you need without too many calories. Decrease your intake of foods high in solid fats, added sugars, and salt. Eat the right amount of calories for you.Get information about a proper diet from your health care provider, if necessary.  Regular physical exercise is one of the most important things you can do for your health. Most adults should get at least 150 minutes of moderate-intensity exercise (any activity that increases your heart rate and causes you to sweat) each week. In addition, most adults need muscle-strengthening exercises on 2 or more days a week.  Maintain a healthy weight. The body mass index (BMI) is a screening tool to identify possible weight problems. It provides an estimate of body fat based on height and weight. Your health care provider can find your BMI and can help you achieve or maintain a healthy weight.For adults 20 years and older:  A BMI below 18.5 is considered underweight.  A BMI of 18.5 to 24.9 is normal.  A BMI of 25 to 29.9 is considered overweight.  A BMI  of 30 and above is considered obese.  Maintain normal blood lipids and cholesterol levels by exercising and minimizing your intake of saturated fat. Eat a balanced diet with plenty of fruit and vegetables. Blood tests for lipids and cholesterol should begin at age 50 and be repeated every 5 years. If your lipid or cholesterol levels are high, you are over 50, or you are at high risk for heart disease, you may need your cholesterol levels checked more frequently.Ongoing high lipid and cholesterol levels should be treated with medicines if diet and exercise are not working.  If you smoke, find out from your health care provider how to quit. If you do not use tobacco, do not start.  Lung cancer screening is recommended for adults aged 73-80 years who are at high risk for developing lung cancer because of a history of smoking. A yearly low-dose CT scan of the lungs is recommended for people who have at least a 30-pack-year history of smoking and are a current smoker or have quit within the past 15 years. A pack year of smoking is smoking an average of 1 pack of cigarettes a day for 1 year (for example: 1 pack a day for 30 years or 2 packs a day for 15 years). Yearly screening should continue until the smoker has stopped smoking for at least 15 years. Yearly screening should be stopped for people who develop a health problem that would prevent them from having lung cancer treatment.  If you choose to drink alcohol, do not have more than  2 drinks per day. One drink is considered to be 12 ounces (355 mL) of beer, 5 ounces (148 mL) of wine, or 1.5 ounces (44 mL) of liquor.  Avoid use of street drugs. Do not share needles with anyone. Ask for help if you need support or instructions about stopping the use of drugs.  High blood pressure causes heart disease and increases the risk of stroke. Your blood pressure should be checked at least every 1-2 years. Ongoing high blood pressure should be treated with  medicines, if weight loss and exercise are not effective.  If you are 45-79 years old, ask your health care provider if you should take aspirin to prevent heart disease.  Diabetes screening involves taking a blood sample to check your fasting blood sugar level. This should be done once every 3 years, after age 45, if you are within normal weight and without risk factors for diabetes. Testing should be considered at a younger age or be carried out more frequently if you are overweight and have at least 1 risk factor for diabetes.  Colorectal cancer can be detected and often prevented. Most routine colorectal cancer screening begins at the age of 50 and continues through age 75. However, your health care provider may recommend screening at an earlier age if you have risk factors for colon cancer. On a yearly basis, your health care provider may provide home test kits to check for hidden blood in the stool. Use of a small camera at the end of a tube to directly examine the colon (sigmoidoscopy or colonoscopy) can detect the earliest forms of colorectal cancer. Talk to your health care provider about this at age 50, when routine screening begins. Direct exam of the colon should be repeated every 5-10 years through age 75, unless early forms of precancerous polyps or small growths are found.  People who are at an increased risk for hepatitis B should be screened for this virus. You are considered at high risk for hepatitis B if:  You were born in a country where hepatitis B occurs often. Talk with your health care provider about which countries are considered high risk.  Your parents were born in a high-risk country and you have not received a shot to protect against hepatitis B (hepatitis B vaccine).  You have HIV or AIDS.  You use needles to inject street drugs.  You live with, or have sex with, someone who has hepatitis B.  You are a man who has sex with other men (MSM).  You get hemodialysis  treatment.  You take certain medicines for conditions such as cancer, organ transplantation, and autoimmune conditions.  Hepatitis C blood testing is recommended for all people born from 1945 through 1965 and any individual with known risks for hepatitis C.  Practice safe sex. Use condoms and avoid high-risk sexual practices to reduce the spread of sexually transmitted infections (STIs). STIs include gonorrhea, chlamydia, syphilis, trichomonas, herpes, HPV, and human immunodeficiency virus (HIV). Herpes, HIV, and HPV are viral illnesses that have no cure. They can result in disability, cancer, and death.  If you are at risk of being infected with HIV, it is recommended that you take a prescription medicine daily to prevent HIV infection. This is called preexposure prophylaxis (PrEP). You are considered at risk if:  You are a man who has sex with other men (MSM) and have other risk factors.  You are a heterosexual man, are sexually active, and are at increased risk for HIV infection.    You take drugs by injection.  You are sexually active with a partner who has HIV.  Talk with your health care provider about whether you are at high risk of being infected with HIV. If you choose to begin PrEP, you should first be tested for HIV. You should then be tested every 3 months for as long as you are taking PrEP.  A one-time screening for abdominal aortic aneurysm (AAA) and surgical repair of large AAAs by ultrasound are recommended for men ages 32 to 67 years who are current or former smokers.  Healthy men should no longer receive prostate-specific antigen (PSA) blood tests as part of routine cancer screening. Talk with your health care provider about prostate cancer screening.  Testicular cancer screening is not recommended for adult males who have no symptoms. Screening includes self-exam, a health care provider exam, and other screening tests. Consult with your health care provider about any symptoms  you have or any concerns you have about testicular cancer.  Use sunscreen. Apply sunscreen liberally and repeatedly throughout the day. You should seek shade when your shadow is shorter than you. Protect yourself by wearing long sleeves, pants, a wide-brimmed hat, and sunglasses year round, whenever you are outdoors.  Once a month, do a whole-body skin exam, using a mirror to look at the skin on your back. Tell your health care provider about new moles, moles that have irregular borders, moles that are larger than a pencil eraser, or moles that have changed in shape or color.  Stay current with required vaccines (immunizations).  Influenza vaccine. All adults should be immunized every year.  Tetanus, diphtheria, and acellular pertussis (Td, Tdap) vaccine. An adult who has not previously received Tdap or who does not know his vaccine status should receive 1 dose of Tdap. This initial dose should be followed by tetanus and diphtheria toxoids (Td) booster doses every 10 years. Adults with an unknown or incomplete history of completing a 3-dose immunization series with Td-containing vaccines should begin or complete a primary immunization series including a Tdap dose. Adults should receive a Td booster every 10 years.  Varicella vaccine. An adult without evidence of immunity to varicella should receive 2 doses or a second dose if he has previously received 1 dose.  Human papillomavirus (HPV) vaccine. Males aged 68-21 years who have not received the vaccine previously should receive the 3-dose series. Males aged 22-26 years may be immunized. Immunization is recommended through the age of 6 years for any male who has sex with males and did not get any or all doses earlier. Immunization is recommended for any person with an immunocompromised condition through the age of 49 years if he did not get any or all doses earlier. During the 3-dose series, the second dose should be obtained 4-8 weeks after the first  dose. The third dose should be obtained 24 weeks after the first dose and 16 weeks after the second dose.  Zoster vaccine. One dose is recommended for adults aged 50 years or older unless certain conditions are present.  Measles, mumps, and rubella (MMR) vaccine. Adults born before [birth year] generally are considered immune to measles and mumps. Adults born in [birth year] or later should have 1 or more doses of MMR vaccine unless there is a contraindication to the vaccine or there is laboratory evidence of immunity to each of the three diseases. A routine second dose of MMR vaccine should be obtained at least 28 days after the first dose for students attending postsecondary  schools, health care workers, or international travelers. People who received inactivated measles vaccine or an unknown type of measles vaccine during 1963-1967 should receive 2 doses of MMR vaccine. People who received inactivated mumps vaccine or an unknown type of mumps vaccine before 1979 and are at high risk for mumps infection should consider immunization with 2 doses of MMR vaccine. Unvaccinated health care workers born before 1957 who lack laboratory evidence of measles, mumps, or rubella immunity or laboratory confirmation of disease should consider measles and mumps immunization with 2 doses of MMR vaccine or rubella immunization with 1 dose of MMR vaccine.  Pneumococcal 13-valent conjugate (PCV13) vaccine. When indicated, a person who is uncertain of his immunization history and has no record of immunization should receive the PCV13 vaccine. An adult aged 19 years or older who has certain medical conditions and has not been previously immunized should receive 1 dose of PCV13 vaccine. This PCV13 should be followed with a dose of pneumococcal polysaccharide (PPSV23) vaccine. The PPSV23 vaccine dose should be obtained at least 8 weeks after the dose of PCV13 vaccine. An adult aged 19 years or older who has certain medical conditions and  previously received 1 or more doses of PPSV23 vaccine should receive 1 dose of PCV13. The PCV13 vaccine dose should be obtained 1 or more years after the last PPSV23 vaccine dose.  Pneumococcal polysaccharide (PPSV23) vaccine. When PCV13 is also indicated, PCV13 should be obtained first. All adults aged 65 years and older should be immunized. An adult younger than age 65 years who has certain medical conditions should be immunized. Any person who resides in a nursing home or long-term care facility should be immunized. An adult smoker should be immunized. People with an immunocompromised condition and certain other conditions should receive both PCV13 and PPSV23 vaccines. People with human immunodeficiency virus (HIV) infection should be immunized as soon as possible after diagnosis. Immunization during chemotherapy or radiation therapy should be avoided. Routine use of PPSV23 vaccine is not recommended for American Indians, [place] Natives, or people younger than 65 years unless there are medical conditions that require PPSV23 vaccine. When indicated, people who have unknown immunization and have no record of immunization should receive PPSV23 vaccine. One-time revaccination 5 years after the first dose of PPSV23 is recommended for people aged 19-64 years who have chronic kidney failure, nephrotic syndrome, asplenia, or immunocompromised conditions. People who received 1-2 doses of PPSV23 before age 65 years should receive another dose of PPSV23 vaccine at age 65 years or later if at least 5 years have passed since the previous dose. Doses of PPSV23 are not needed for people immunized with PPSV23 at or after age 65 years.  Meningococcal vaccine. Adults with asplenia or persistent complement component deficiencies should receive 2 doses of quadrivalent meningococcal conjugate (MenACWY-D) vaccine. The doses should be obtained at least 2 months apart. Microbiologists working with certain meningococcal bacteria,  military recruits, people at risk during an outbreak, and people who travel to or live in countries with a high rate of meningitis should be immunized. A first-year college student up through age 21 years who is living in a residence hall should receive a dose if he did not receive a dose on or after his 16th birthday. Adults who have certain high-risk conditions should receive one or more doses of vaccine.  Hepatitis A vaccine. Adults who wish to be protected from this disease, have certain high-risk conditions, work with hepatitis A-infected animals, work in hepatitis A research labs, or   travel to or work in countries with a high rate of hepatitis A should be immunized. Adults who were previously unvaccinated and who anticipate close contact with an international adoptee during the first 60 days after arrival in the [place] from a country with a high rate of hepatitis A should be immunized.  Hepatitis B vaccine. Adults should be immunized if they wish to be protected from this disease, have certain high-risk conditions, may be exposed to blood or other infectious body fluids, are household contacts or sex partners of hepatitis B positive people, are clients or workers in certain care facilities, or travel to or work in countries with a high rate of hepatitis B.  Haemophilus influenzae type b (Hib) vaccine. A previously unvaccinated person with asplenia or sickle cell disease or having a scheduled splenectomy should receive 1 dose of Hib vaccine. Regardless of previous immunization, a recipient of a hematopoietic stem cell transplant should receive a 3-dose series 6-12 months after his successful transplant. Hib vaccine is not recommended for adults with HIV infection. Preventive Service / Frequency Ages 52 to 17  Blood pressure check.** / Every 1 to 2 years.  Lipid and cholesterol check.** / Every 5 years beginning at age 69.  Hepatitis C blood test.** / For any individual with known risks for  hepatitis C.  Skin self-exam. / Monthly.  Influenza vaccine. / Every year.  Tetanus, diphtheria, and acellular pertussis (Tdap, Td) vaccine.** / Consult your health care provider. 1 dose of Td every 10 years.  Varicella vaccine.** / Consult your health care provider.  HPV vaccine. / 3 doses over 6 months, if 72 or younger.  Measles, mumps, rubella (MMR) vaccine.** / You need at least 1 dose of MMR if you were born in 1957 or later. You may also need a second dose.  Pneumococcal 13-valent conjugate (PCV13) vaccine.** / Consult your health care provider.  Pneumococcal polysaccharide (PPSV23) vaccine.** / 1 to 2 doses if you smoke cigarettes or if you have certain conditions.  Meningococcal vaccine.** / 1 dose if you are age 35 to 60 years and a Market researcher living in a residence hall, or have one of several medical conditions. You may also need additional booster doses.  Hepatitis A vaccine.** / Consult your health care provider.  Hepatitis B vaccine.** / Consult your health care provider.  Haemophilus influenzae type b (Hib) vaccine.** / Consult your health care provider. Ages 35 to 8  Blood pressure check.** / Every 1 to 2 years.  Lipid and cholesterol check.** / Every 5 years beginning at age 57.  Lung cancer screening. / Every year if you are aged 44-80 years and have a 30-pack-year history of smoking and currently smoke or have quit within the past 15 years. Yearly screening is stopped once you have quit smoking for at least 15 years or develop a health problem that would prevent you from having lung cancer treatment.  Fecal occult blood test (FOBT) of stool. / Every year beginning at age 55 and continuing until age 73. You may not have to do this test if you get a colonoscopy every 10 years.  Flexible sigmoidoscopy** or colonoscopy.** / Every 5 years for a flexible sigmoidoscopy or every 10 years for a colonoscopy beginning at age 28 and continuing until age  1.  Hepatitis C blood test.** / For all people born from [birth year] through 1965 and any individual with known risks for hepatitis C.  Skin self-exam. / Monthly.  Influenza vaccine. / Every  year.  Tetanus, diphtheria, and acellular pertussis (Tdap/Td) vaccine.** / Consult your health care provider. 1 dose of Td every 10 years.  Varicella vaccine.** / Consult your health care provider.  Zoster vaccine.** / 1 dose for adults aged 53 years or older.  Measles, mumps, rubella (MMR) vaccine.** / You need at least 1 dose of MMR if you were born in 1957 or later. You may also need a second dose.  Pneumococcal 13-valent conjugate (PCV13) vaccine.** / Consult your health care provider.  Pneumococcal polysaccharide (PPSV23) vaccine.** / 1 to 2 doses if you smoke cigarettes or if you have certain conditions.  Meningococcal vaccine.** / Consult your health care provider.  Hepatitis A vaccine.** / Consult your health care provider.  Hepatitis B vaccine.** / Consult your health care provider.  Haemophilus influenzae type b (Hib) vaccine.** / Consult your health care provider. Ages 77 and over  Blood pressure check.** / Every 1 to 2 years.  Lipid and cholesterol check.**/ Every 5 years beginning at age 85.  Lung cancer screening. / Every year if you are aged 55-80 years and have a 30-pack-year history of smoking and currently smoke or have quit within the past 15 years. Yearly screening is stopped once you have quit smoking for at least 15 years or develop a health problem that would prevent you from having lung cancer treatment.  Fecal occult blood test (FOBT) of stool. / Every year beginning at age 33 and continuing until age 11. You may not have to do this test if you get a colonoscopy every 10 years.  Flexible sigmoidoscopy** or colonoscopy.** / Every 5 years for a flexible sigmoidoscopy or every 10 years for a colonoscopy beginning at age 28 and continuing until age 73.  Hepatitis C blood  test.** / For all people born from [birth year] through 1965 and any individual with known risks for hepatitis C.  Abdominal aortic aneurysm (AAA) screening.** / A one-time screening for ages 50 to 27 years who are current or former smokers.  Skin self-exam. / Monthly.  Influenza vaccine. / Every year.  Tetanus, diphtheria, and acellular pertussis (Tdap/Td) vaccine.** / 1 dose of Td every 10 years.  Varicella vaccine.** / Consult your health care provider.  Zoster vaccine.** / 1 dose for adults aged 34 years or older.  Pneumococcal 13-valent conjugate (PCV13) vaccine.** / Consult your health care provider.  Pneumococcal polysaccharide (PPSV23) vaccine.** / 1 dose for all adults aged 63 years and older.  Meningococcal vaccine.** / Consult your health care provider.  Hepatitis A vaccine.** / Consult your health care provider.  Hepatitis B vaccine.** / Consult your health care provider.  Haemophilus influenzae type b (Hib) vaccine.** / Consult your health care provider. **Family history and personal history of risk and conditions may change your health care provider's recommendations. Document Released: 01/10/2002 Document Revised: 11/19/2013 Document Reviewed: 04/11/2011 [facility] Patient Information 2015 [place], [place]. This information is not intended to replace advice given to you by your health care provider. Make sure you discuss any questions you have with your health care provider.

## 2014-11-24 NOTE — Assessment & Plan Note (Signed)
Avoid offending foods, start probiotics. Do not eat large meals in late evening and consider raising head of bed.  

## 2014-11-24 NOTE — Progress Notes (Signed)
Daniel Reeves  [id number] 09-13-1924 11/24/2014      Progress Note-Follow Up  Subjective  Chief Complaint  Chief Complaint  Patient presents with  . Medicare Wellness    HPI  Patient is a 80 y.o. male in today for routine medical care. Patient is in today accompanied by his wife for an annual exam. All for all he reports she's doing well. No recent illness. No recent trips to the emergency room or concerns. Does continue to occasionally notes some chest discomfort with exertion but this is stable and not worsening. He is noting some mild weakness in his legs when he tries to arise from a sitting position or when he is going up stairs. Says this been happening since his stent last year. Not worsening. No numbness tingling noted. No major complaints back pain. Denies palp/SOB/HA/congestion/fevers/GI or GU c/o. Taking meds as prescribed  Past Medical History  Diagnosis Date  . Cerumen impaction     Bilateral  . Hypertension   . Peripheral vascular disease   . Hypercholesterolemia   . GERD (gastroesophageal reflux disease)   . Diverticulosis of colon   . Amebic dysentery   . Benign prostatic hypertrophy   . Degenerative joint disease   . Gout   . Lumbar back pain   . Anxiety   . Nephrolithiasis   . Heart murmur   . CAD (coronary artery disease)     a. s/p CABG in 1979 and 1993;  b. LHC (5/14):  LM, LAD, CFX and RCA occluded; L-LAD ok, dLAD occluded after insertion of LIMA, S-OM occluded, S-PDA/AM 80-90 => PCI with Promus DES; EF 25%  . Chronic systolic CHF (congestive heart failure)   . Ischemic cardiomyopathy     a. echo (09/05/13): EF 35%, diffuse HK worsened distal septal, mid/distal inferior and apical region, grade 1 diastolic dysfunction, mild LAE.    [name] Abdominal aortic aneurysm     a. [place] (1/14):  3.3 x 3.4 cm => f/u 11/2013  . Chicken pox as a child  . Measles as a child  . Mumps as a child    Past Surgical History  Procedure Laterality Date  . Coronary artery  bypass graft  1979    x4 SVG-DIAG-LAD, SVG-OM-PDA  . Coronary artery bypass graft  1993    Redo x5 by Dr Harlow Asa; Durward Fortes, SVG-OM, SVG-AM-PL  . Inguinal hernia repair  1994    Right by Dr Harlow Asa  . Inguinal hernia repair  1996    Left by Dr. Harlow Asa  . Decompressive laminectomy  01/2006    L2 - scarum by Dr. Shellia Carwin  . Coronary angioplasty with stent placement  04/18/2013    RCA       . Hemorrhoid surgery      fissure with hemorrhoid corrected at age 70  . Tonsillectomy    . Percutaneous coronary stent intervention (pci-s) N/A 04/18/2013    Procedure: PERCUTANEOUS CORONARY STENT INTERVENTION (PCI-S);  Surgeon: Sherren Mocha, MD;  Location: [facility] CATH LAB;  Service: Cardiovascular;  Laterality: N/A;  . Left heart catheterization with coronary angiogram N/A 09/23/2013    Procedure: LEFT HEART CATHETERIZATION WITH CORONARY ANGIOGRAM;  Surgeon: Blane Ohara, MD;  Location: [facility] CATH LAB;  Service: Cardiovascular;  Laterality: N/A;    Family History  Problem Relation Age of Onset  . Parkinsonism Brother   . Diabetes Maternal Grandmother   . Depression Daughter   . Other Son     4 stents  . Heart disease Son   .  Diabetes Son     type 2    History   Social History  . Marital Status: Married    Spouse Name: [name] x 64 yrs    Number of Children: N/A  . Years of Education: N/A   Occupational History  . 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 Smoking status: Former Smoker    Quit date: 11/28/1944  . Smokeless tobacco: Never Used  . Alcohol Use: 7.0 oz/week    14 drink(s) per week     Comment: daily rum  or wine  . Drug Use: No  . Sexual Activity: Not Currently     Comment: lives with wife, no dietary restrictions.    Other Topics Concern  . Not on file   Social History Narrative   Married   7 children    Current Outpatient Prescriptions on File Prior to Visit  Medication Sig Dispense Refill  . allopurinol (ZYLOPRIM) 300  MG tablet Take 1 tablet (300 mg total) by mouth daily with breakfast. 90 tablet 1  . aspirin EC 81 MG tablet Take 81 mg by mouth every morning.     . Cholecalciferol (VITAMIN D-3 PO) Take 2,000 Units by mouth daily with breakfast.     . clopidogrel (PLAVIX) 75 MG tablet Take 1 tablet (75 mg total) by mouth every morning. 90 tablet 3  . cyanocobalamin 2000 MCG tablet Take 2,000 mcg by mouth daily.    . finasteride (PROSCAR) 5 MG tablet Take 5 mg by mouth See admin instructions. As directed    . folic acid (FOLVITE) 947 MCG tablet Take 400 mcg by mouth 2 (two) times daily.     . isosorbide mononitrate (IMDUR) 30 MG 24 hr tablet Take 1 tablet (30 mg total) by mouth daily. 90 tablet 1  . losartan (COZAAR) 50 MG tablet Take 1 tablet (50 mg total) by mouth daily. 90 tablet 4  . methylcellulose (CITRUCEL) oral powder Take 1 packet by mouth daily with breakfast.    . metoprolol succinate (TOPROL XL) 25 MG 24 hr tablet Take 0.5 tablets (12.5 mg total) by mouth daily. 45 tablet 4  . Misc Natural Products (OSTEO BI-FLEX ADV JOINT SHIELD) TABS Take 1 tablet by mouth 2 (two) times daily.     . Multiple Vitamins-Minerals (CENTRUM SILVER ADULT 50+ PO) Take 1 tablet by mouth daily. Every other day    . multivitamin-lutein (OCUVITE-LUTEIN) CAPS capsule Take 1 capsule by mouth every morning.     . nitroGLYCERIN (NITROSTAT) 0.4 MG SL tablet Place 1 tablet (0.4 mg total) under the tongue every 5 (five) minutes as needed. For chest pain. 25 tablet 3  . simvastatin (ZOCOR) 40 MG tablet Take 1 tablet (40 mg total) by mouth every evening. 90 tablet 1   No current facility-administered medications on file prior to visit.    Allergies  Allergen Reactions  . Lisinopril     REACTION: dizziness  . Methocarbamol     REACTION: pt states "dizzy"  . Pregabalin     REACTION: pt states "dizzy"  . Ramipril     REACTION: hives and dizziness    Review of Systems  Review of Systems  Constitutional: Negative for fever,  chills and malaise/fatigue.  HENT: Negative for congestion, hearing loss and nosebleeds.   Eyes: Negative for discharge.  Respiratory: Negative for cough, sputum production, shortness of breath and wheezing.   Cardiovascular: Negative for chest pain, palpitations and leg swelling.  Gastrointestinal: Negative for heartburn, nausea, vomiting, abdominal pain, diarrhea, constipation and blood in stool.  Genitourinary: Negative for dysuria, urgency, frequency and hematuria.  Musculoskeletal: Negative for myalgias, back pain and falls.  Skin: Negative for rash.  Neurological: Positive for focal weakness. Negative for dizziness, tremors, sensory change, loss of consciousness, weakness and headaches.       Notes trouble in thighs upon arising with weakness  Endo/Heme/Allergies: Negative for polydipsia. Does not bruise/bleed easily.  Psychiatric/Behavioral: Negative for depression and suicidal ideas. The patient is not nervous/anxious and does not have insomnia.     Objective  BP 126/59 mmHg  Pulse 51  Temp(Src) 98.1 F (36.7 C) (Oral)  Ht 5\' 11"  (1.803 m)  Wt 172 lb 3.2 oz (78.109 kg)  BMI 24.03 kg/m2  SpO2 98%  Physical Exam  Physical Exam  Constitutional: He is oriented to person, place, and time and well-developed, well-nourished, and in no distress. No distress.  HENT:  Head: Normocephalic and atraumatic.  Eyes: Conjunctivae are normal.  Neck: Neck supple. No thyromegaly present.  Cardiovascular: Normal rate, regular rhythm and normal heart sounds.   No murmur heard. Pulmonary/Chest: Effort normal and breath sounds normal. No respiratory distress.  Abdominal: He exhibits no distension and no mass. There is no tenderness.  Musculoskeletal: He exhibits no edema.  Neurological: He is alert and oriented to person, place, and time.  Skin: Skin is warm.  Psychiatric: Memory, affect and judgment normal.    Lab Results  Component Value Date   TSH 2.34 03/19/2013   Lab Results    Component Value Date   WBC 7.8 10/18/2013   HGB 14.3 10/18/2013   HCT 42.2 10/18/2013   MCV 96.7 10/18/2013   PLT 210.0 10/18/2013   Lab Results  Component Value Date   CREATININE 0.97 04/09/2014   BUN 21 04/09/2014   NA 142 04/09/2014   K 4.2 04/09/2014   CL 107 04/09/2014   CO2 26 04/09/2014   Lab Results  Component Value Date   ALT 22 04/09/2014   AST 26 04/09/2014   ALKPHOS 67 04/09/2014   BILITOT 1.1 04/09/2014   Lab Results  Component Value Date   CHOL 99 04/09/2014   Lab Results  Component Value Date   HDL 37* 04/09/2014   Lab Results  Component Value Date   LDLCALC 48 04/09/2014   Lab Results  Component Value Date   TRIG 70 04/09/2014   Lab Results  Component Value Date   CHOLHDL 2.7 04/09/2014     Assessment & Plan  Essential hypertension Well controlled, no changes to meds. Encouraged heart healthy diet such as the DASH diet and exercise as tolerated.   GERD Avoid offending foods, start probiotics. Do not eat large meals in late evening and consider raising head of bed.   CAD (coronary artery disease) Following with Dr Stanford Breed and doing well given history of significant bypass grafting and stent last year. Still has occasional CP with exertion but not escalating. Will let us know or cardiology if changes  HYPERCHOLESTEROLEMIA Tolerating statin, encouraged heart healthy diet, avoid trans fats, minimize simple carbs and saturated fats. Increase exercise as tolerated  BACK PAIN, LUMBAR C/o weakness in both legs when going up stairs or arising from sitting position for about a year now dating back to his cardiac stent. He is offered a course of PT to work on his strength but declines for now. It is not worsening and he remains active, he will call if worsens for further  consideration or referral.  Medicare annual wellness visit, subsequent Patient denies any difficulties at home. No trouble with ADLs, depression or falls. No recent changes to  vision or hearing. Is UTD with immunizations. Is UTD with screening. Discussed Advanced Directives, patient agrees to bring [place] copies of documents if can. Encouraged heart healthy diet, exercise as tolerated and adequate sleep  Sees Dr Delman Cheadle for dermatology Sees Dr Roni Bread of Urology Sees Dr Stanford Breed of cardiology Sees Dr Virginia Rochester of Opthamology No further colonoscopies warranted Fasting labs ordered today  Hearing loss Failed whisper test at 5 feet encouraged to go for audiology evaluation. No wax noted today

## 2014-11-24 NOTE — Assessment & Plan Note (Signed)
Patient denies any difficulties at home. No trouble with ADLs, depression or falls. No recent changes to vision or hearing. Is UTD with immunizations. Is UTD with screening. Discussed Advanced Directives, patient agrees to bring [place] copies of documents if can. Encouraged heart healthy diet, exercise as tolerated and adequate sleep  Sees Dr Delman Cheadle for dermatology Sees Dr Roni Bread of Urology Sees Dr Stanford Breed of cardiology Sees Dr Virginia Rochester of Opthamology No further colonoscopies warranted Fasting labs ordered today

## 2014-11-24 NOTE — Assessment & Plan Note (Signed)
C/o weakness in both legs when going up stairs or arising from sitting position for about a year now dating back to his cardiac stent. He is offered a course of PT to work on his strength but declines for now. It is not worsening and he remains active, he will call if worsens for further consideration or referral.

## 2014-11-24 NOTE — Progress Notes (Signed)
Pre visit review using our clinic review tool, if applicable. No additional management support is needed unless otherwise documented below in the visit note. 

## 2014-11-24 NOTE — Assessment & Plan Note (Signed)
Following with Dr Stanford Breed and doing well given history of significant bypass grafting and stent last year. Still has occasional CP with exertion but not escalating. Will let us know or cardiology if changes

## 2014-11-24 NOTE — Assessment & Plan Note (Signed)
Well controlled, no changes to meds. Encouraged heart healthy diet such as the DASH diet and exercise as tolerated.  °

## 2014-12-18 ENCOUNTER — Other Ambulatory Visit: Payer: Self-pay | Admitting: Radiology

## 2014-12-18 DIAGNOSIS — I714 Abdominal aortic aneurysm, without rupture, unspecified: Secondary | ICD-10-CM

## 2014-12-22 ENCOUNTER — Telehealth: Payer: Self-pay | Admitting: Cardiology

## 2014-12-22 DIAGNOSIS — I714 Abdominal aortic aneurysm, without rupture, unspecified: Secondary | ICD-10-CM

## 2014-12-22 DIAGNOSIS — M4807 Spinal stenosis, lumbosacral region: Secondary | ICD-10-CM | POA: Diagnosis not present

## 2014-12-22 DIAGNOSIS — M170 Bilateral primary osteoarthritis of knee: Secondary | ICD-10-CM | POA: Diagnosis not present

## 2014-12-22 NOTE — Telephone Encounter (Signed)
New Message       Patient is concerned about the abdominal aorta that he is suppose to have on 01/23/15, patient spoke with primary and they are concerned with the aorta aneurism.  Please give patient a call back.

## 2014-12-22 NOTE — Telephone Encounter (Signed)
Spoke with pt, he was given the number to the imaging department at the [facility] so he can call and reschedule his ultrasound.

## 2014-12-23 ENCOUNTER — Encounter (HOSPITAL_BASED_OUTPATIENT_CLINIC_OR_DEPARTMENT_OTHER): Payer: Self-pay

## 2014-12-23 ENCOUNTER — Ambulatory Visit (HOSPITAL_BASED_OUTPATIENT_CLINIC_OR_DEPARTMENT_OTHER)
Admission: RE | Admit: 2014-12-23 | Discharge: 2014-12-23 | Disposition: A | Discharge status: Home / Self Care | Payer: Medicare Other | Source: Ambulatory Visit | Attending: Cardiology | Admitting: Cardiology

## 2014-12-23 ENCOUNTER — Emergency Department (HOSPITAL_BASED_OUTPATIENT_CLINIC_OR_DEPARTMENT_OTHER)
Admission: EM | Admit: 2014-12-23 | Discharge: 2014-12-23 | Disposition: A | Discharge status: Home / Self Care | Payer: Medicare Other | Attending: Emergency Medicine | Admitting: Emergency Medicine

## 2014-12-23 ENCOUNTER — Emergency Department (HOSPITAL_BASED_OUTPATIENT_CLINIC_OR_DEPARTMENT_OTHER): Payer: Medicare Other

## 2014-12-23 DIAGNOSIS — Z9861 Coronary angioplasty status: Secondary | ICD-10-CM | POA: Diagnosis not present

## 2014-12-23 DIAGNOSIS — R011 Cardiac murmur, unspecified: Secondary | ICD-10-CM | POA: Insufficient documentation

## 2014-12-23 DIAGNOSIS — Z8719 Personal history of other diseases of the digestive system: Secondary | ICD-10-CM | POA: Insufficient documentation

## 2014-12-23 DIAGNOSIS — Y998 Other external cause status: Secondary | ICD-10-CM | POA: Insufficient documentation

## 2014-12-23 DIAGNOSIS — Z951 Presence of aortocoronary bypass graft: Secondary | ICD-10-CM | POA: Diagnosis not present

## 2014-12-23 DIAGNOSIS — Z79899 Other long term (current) drug therapy: Secondary | ICD-10-CM | POA: Insufficient documentation

## 2014-12-23 DIAGNOSIS — Z7902 Long term (current) use of antithrombotics/antiplatelets: Secondary | ICD-10-CM | POA: Diagnosis not present

## 2014-12-23 DIAGNOSIS — Z7982 Long term (current) use of aspirin: Secondary | ICD-10-CM | POA: Insufficient documentation

## 2014-12-23 DIAGNOSIS — E780 Pure hypercholesterolemia: Secondary | ICD-10-CM | POA: Diagnosis not present

## 2014-12-23 DIAGNOSIS — S298XXA Other specified injuries of thorax, initial encounter: Secondary | ICD-10-CM

## 2014-12-23 DIAGNOSIS — W01198A Fall on same level from slipping, tripping and stumbling with subsequent striking against other object, initial encounter: Secondary | ICD-10-CM | POA: Insufficient documentation

## 2014-12-23 DIAGNOSIS — F419 Anxiety disorder, unspecified: Secondary | ICD-10-CM | POA: Insufficient documentation

## 2014-12-23 DIAGNOSIS — S0993XA Unspecified injury of face, initial encounter: Secondary | ICD-10-CM | POA: Diagnosis present

## 2014-12-23 DIAGNOSIS — N400 Enlarged prostate without lower urinary tract symptoms: Secondary | ICD-10-CM | POA: Diagnosis not present

## 2014-12-23 DIAGNOSIS — S0031XA Abrasion of nose, initial encounter: Secondary | ICD-10-CM | POA: Diagnosis not present

## 2014-12-23 DIAGNOSIS — S299XXA Unspecified injury of thorax, initial encounter: Secondary | ICD-10-CM | POA: Diagnosis not present

## 2014-12-23 DIAGNOSIS — Z8619 Personal history of other infectious and parasitic diseases: Secondary | ICD-10-CM | POA: Diagnosis not present

## 2014-12-23 DIAGNOSIS — Z87891 Personal history of nicotine dependence: Secondary | ICD-10-CM | POA: Insufficient documentation

## 2014-12-23 DIAGNOSIS — Y9289 Other specified places as the place of occurrence of the external cause: Secondary | ICD-10-CM | POA: Diagnosis not present

## 2014-12-23 DIAGNOSIS — M109 Gout, unspecified: Secondary | ICD-10-CM | POA: Insufficient documentation

## 2014-12-23 DIAGNOSIS — I714 Abdominal aortic aneurysm, without rupture, unspecified: Secondary | ICD-10-CM

## 2014-12-23 DIAGNOSIS — Z87442 Personal history of urinary calculi: Secondary | ICD-10-CM | POA: Diagnosis not present

## 2014-12-23 DIAGNOSIS — I10 Essential (primary) hypertension: Secondary | ICD-10-CM | POA: Insufficient documentation

## 2014-12-23 DIAGNOSIS — H9190 Unspecified hearing loss, unspecified ear: Secondary | ICD-10-CM | POA: Insufficient documentation

## 2014-12-23 DIAGNOSIS — I5022 Chronic systolic (congestive) heart failure: Secondary | ICD-10-CM | POA: Diagnosis not present

## 2014-12-23 DIAGNOSIS — I2510 Atherosclerotic heart disease of native coronary artery without angina pectoris: Secondary | ICD-10-CM | POA: Diagnosis not present

## 2014-12-23 DIAGNOSIS — Y9389 Activity, other specified: Secondary | ICD-10-CM | POA: Diagnosis not present

## 2014-12-23 DIAGNOSIS — R0781 Pleurodynia: Secondary | ICD-10-CM | POA: Diagnosis not present

## 2014-12-23 MED ORDER — OXYCODONE-ACETAMINOPHEN 5-325 MG PO TABS: 1.0000 | ORAL_TABLET | ORAL | Status: DC | PRN

## 2014-12-23 MED ORDER — TETANUS-DIPHTH-ACELL PERTUSSIS 5-2.5-18.5 LF-MCG/0.5 IM SUSP: 0.5000 mL | Freq: Once | INTRAMUSCULAR | Status: DC

## 2014-12-23 NOTE — ED Notes (Signed)
MD at bedside. 

## 2014-12-23 NOTE — Discharge Instructions (Signed)
You have had a head injury which does not appear to require admission at this time. A concussion is a state of changed mental ability from trauma.  SEEK IMMEDIATE MEDICAL ATTENTION IF: There is confusion or drowsiness (although children frequently become drowsy after injury).  You cannot awaken the injured person.  There is nausea (feeling sick to your stomach) or continued, forceful vomiting.  You notice dizziness or unsteadiness which is getting worse, or inability to walk.  You have convulsions or unconsciousness.  You experience severe, persistent headaches not relieved by Tylenol. (Do not take aspirin as this impairs clotting abilities). Take other pain medications only as directed.  You cannot use arms or legs normally.  There are changes in pupil sizes. (This is the black center in the colored part of the eye)  There is clear or bloody discharge from the nose or ears.  Change in speech, vision, swallowing, or understanding.  Localized weakness, numbness, tingling, or change in bowel or bladder control.    Blunt Chest Trauma Blunt chest trauma is an injury caused by a blow to the chest. These chest injuries can be very painful. Blunt chest trauma often results in bruised or broken (fractured) ribs. Most cases of bruised and fractured ribs from blunt chest traumas get better after 1 to 3 weeks of rest and pain medicine. Often, the soft tissue in the chest wall is also injured, causing pain and bruising. Internal organs, such as the heart and lungs, may also be injured. Blunt chest trauma can lead to serious medical problems. This injury requires immediate medical care. CAUSES   Motor vehicle collisions.  Falls.  Physical violence.  Sports injuries. SYMPTOMS   Chest pain. The pain may be worse when you move or breathe deeply.  Shortness of breath.  Lightheadedness.  Bruising.  Tenderness.  Swelling. DIAGNOSIS  Your caregiver will do a physical exam. X-rays may be taken  to look for fractures. However, minor rib fractures may not show up on X-rays until a few days after the injury. If a more serious injury is suspected, further imaging tests may be done. This may include ultrasounds, computed tomography (CT) scans, or magnetic resonance imaging (MRI). TREATMENT  Treatment depends on the severity of your injury. Your caregiver may prescribe pain medicines and deep breathing exercises. HOME CARE INSTRUCTIONS  Limit your activities until you can move around without much pain.  Do not do any strenuous work until your injury is healed.  Put ice on the injured area.  Put ice in a plastic bag.  Place a towel between your skin and the bag.  Leave the ice on for 15-20 minutes, 03-04 times a day.  You may wear a rib belt as directed by your caregiver to reduce pain.  Practice deep breathing as directed by your caregiver to keep your lungs clear.  Only take over-the-counter or prescription medicines for pain, fever, or discomfort as directed by your caregiver. SEEK IMMEDIATE MEDICAL CARE IF:   You have increasing pain or shortness of breath.  You cough up blood.  You have nausea, vomiting, or abdominal pain.  You have a fever.  You feel dizzy, weak, or you faint. MAKE SURE YOU:  Understand these instructions.  Will watch your condition.  Will get help right away if you are not doing well or get worse. Document Released: 12/22/2004 Document Revised: 02/06/2012 Document Reviewed: 08/31/2011 [facility] Patient Information 2015 [place], [place]. This information is not intended to replace advice given to you by your  health care provider. Make sure you discuss any questions you have with your health care provider.

## 2014-12-23 NOTE — ED Notes (Signed)
Pt reports fall this morning. Skin tear to nose. Bleeding controlled at this time. Reports left rib pain and back pain

## 2014-12-23 NOTE — ED Provider Notes (Signed)
CSN: [id number]     Arrival date & time 12/23/14  0831 History   First MD Initiated Contact with Patient 12/23/14 [phone]     Chief Complaint  Patient presents with  . Fall     Patient is a 81 y.o. male presenting with fall. The history is provided by the patient and a significant other.  Fall This is a new problem. The current episode started 1 to 2 hours ago. The problem has not changed since onset.Associated symptoms include chest pain. Pertinent negatives include no abdominal pain, no headaches and no shortness of breath. Associated symptoms comments: Chest wall pain . Exacerbated by: movement/deep breathing. The symptoms are relieved by rest.  Patient reports he slipped/fell this morning He reports his slipped on ice He reports falling face forward and hitting his nose on ground He also reports hitting his left ribs on ground No LOC No HA No neck pain No back pain No visual changes He reports abrasion to nose No facial/nasal pain reported He was otherwise feeling well He reports he is supposed to have abdominal US later this morning for AAA surveillance He does take plavix and reports bleeding from nose wound for 20 minutes but is has stopped bleeding  Past Medical History  Diagnosis Date  . Cerumen impaction     Bilateral  . Hypertension   . Peripheral vascular disease   . Hypercholesterolemia   . GERD (gastroesophageal reflux disease)   . Diverticulosis of colon   . Amebic dysentery   . Benign prostatic hypertrophy   . Degenerative joint disease   . Gout   . Lumbar back pain   . Anxiety   . Nephrolithiasis   . Heart murmur   . CAD (coronary artery disease)     a. s/p CABG in 1979 and 1993;  b. LHC (5/14):  LM, LAD, CFX and RCA occluded; L-LAD ok, dLAD occluded after insertion of LIMA, S-OM occluded, S-PDA/AM 80-90 => PCI with Promus DES; EF 25%  . Chronic systolic CHF (congestive heart failure)   . Ischemic cardiomyopathy     a. echo (09/05/13): EF 35%, diffuse HK  worsened distal septal, mid/distal inferior and apical region, grade 1 diastolic dysfunction, mild LAE.    [name] Abdominal aortic aneurysm     a. [place] (1/14):  3.3 x 3.4 cm => f/u 11/2013  . Chicken pox as a child  . Measles as a child  . Mumps as a child  . Hearing loss 11/24/2014   Past Surgical History  Procedure Laterality Date  . Coronary artery bypass graft  1979    x4 SVG-DIAG-LAD, SVG-OM-PDA  . Coronary artery bypass graft  1993    Redo x5 by Dr Harlow Asa; Durward Fortes, SVG-OM, SVG-AM-PL  . Inguinal hernia repair  1994    Right by Dr Harlow Asa  . Inguinal hernia repair  1996    Left by Dr. Harlow Asa  . Decompressive laminectomy  01/2006    L2 - scarum by Dr. Shellia Carwin  . Coronary angioplasty with stent placement  04/18/2013    RCA       . Hemorrhoid surgery      fissure with hemorrhoid corrected at age 49  . Tonsillectomy    . Percutaneous coronary stent intervention (pci-s) N/A 04/18/2013    Procedure: PERCUTANEOUS CORONARY STENT INTERVENTION (PCI-S);  Surgeon: Sherren Mocha, MD;  Location: [facility] CATH LAB;  Service: Cardiovascular;  Laterality: N/A;  . Left heart catheterization with coronary angiogram N/A 09/23/2013    Procedure: LEFT HEART  CATHETERIZATION WITH CORONARY ANGIOGRAM;  Surgeon: Blane Ohara, MD;  Location: [facility] CATH LAB;  Service: Cardiovascular;  Laterality: N/A;   Family History  Problem Relation Age of Onset  . Parkinsonism Brother   . Diabetes Maternal Grandmother   . Depression Daughter   . Other Son     4 stents  . Heart disease Son   . Diabetes Son     type 2   History  Substance Use Topics  . Smoking status: Former Smoker    Quit date: 11/28/1944  . Smokeless tobacco: Never Used  . Alcohol Use: 7.0 oz/week    14 drink(s) per week     Comment: daily rum  or wine    Review of Systems  Eyes: Negative for visual disturbance.  Respiratory: Negative for shortness of breath.   Cardiovascular: Positive for chest pain.  Gastrointestinal: Negative for  abdominal pain.  Musculoskeletal: Negative for back pain, arthralgias and neck pain.  Skin: Positive for wound.  Neurological: Negative for weakness and headaches.  All other systems reviewed and are negative.     Allergies  Lisinopril; Methocarbamol; Pregabalin; and Ramipril  Home Medications   Prior to Admission medications   Medication Sig Start Date End Date Taking? Authorizing Provider  allopurinol (ZYLOPRIM) 300 MG tablet Take 1 tablet (300 mg total) by mouth daily with breakfast. 06/11/14   [name], MD  aspirin EC 81 MG tablet Take 81 mg by mouth every morning.     Historical Provider, MD  Cholecalciferol (VITAMIN D-3 PO) Take 2,000 Units by mouth daily with breakfast.     Historical Provider, MD  clopidogrel (PLAVIX) 75 MG tablet Take 1 tablet (75 mg total) by mouth every morning. 05/19/14   Lelon Perla, MD  cyanocobalamin 2000 MCG tablet Take 2,000 mcg by mouth daily.    Historical Provider, MD  finasteride (PROSCAR) 5 MG tablet Take 5 mg by mouth See admin instructions. As directed    Historical Provider, MD  fluorouracil (EFUDEX) 5 % cream  11/12/14   Historical Provider, MD  folic acid (FOLVITE) 431 MCG tablet Take 400 mcg by mouth 2 (two) times daily.     Historical Provider, MD  isosorbide mononitrate (IMDUR) 30 MG 24 hr tablet Take 1 tablet (30 mg total) by mouth daily. 02/06/14   Lelon Perla, MD  losartan (COZAAR) 50 MG tablet Take 1 tablet (50 mg total) by mouth daily. 10/08/14   Lelon Perla, MD  methylcellulose (CITRUCEL) oral powder Take 1 packet by mouth daily with breakfast.    Historical Provider, MD  metoprolol succinate (TOPROL XL) 25 MG 24 hr tablet Take 0.5 tablets (12.5 mg total) by mouth daily. 10/08/14   Lelon Perla, MD  Misc Natural Products (OSTEO BI-FLEX ADV JOINT SHIELD) TABS Take 1 tablet by mouth 2 (two) times daily.     Historical Provider, MD  Multiple Vitamins-Minerals (CENTRUM SILVER ADULT 50+ PO) Take 1 tablet by mouth  daily. Every other day    Historical Provider, MD  multivitamin-lutein [facility]) CAPS capsule Take 1 capsule by mouth every morning.     Historical Provider, MD  nitroGLYCERIN (NITROSTAT) 0.4 MG SL tablet Place 1 tablet (0.4 mg total) under the tongue every 5 (five) minutes as needed. For chest pain. 02/04/13   Noralee Space, MD  simvastatin (ZOCOR) 40 MG tablet Take 1 tablet (40 mg total) by mouth every evening. 09/09/14   [name], MD   BP 154/74 mmHg  Pulse  66  Temp(Src) 97.6 F (36.4 C) (Oral)  Resp 16  SpO2 100% Physical Exam CONSTITUTIONAL: Well developed/well nourished HEAD: Normocephalic/atraumatic EYES: EOMI/PERRL ENMT: Mucous membranes moist.  Small abrasion to nose.  Bleeding controlled.  No septal hematoma.  He has chronic deviated septum at baseline.  No tenderness to palpation of nose or nasal septum.  No tenderness face/mandible.  No new dental injury (poor dentition at baseline) NECK: supple no meningeal signs SPINE/BACK:entire spine nontender, NEXUS criteria met Chest - diffuse tenderness to left chest.  No bruising/crepitus noted.   CV: S1/S2 noted, no murmurs/rubs/gallops noted LUNGS: Lungs are clear to auscultation bilaterally, no apparent distress ABDOMEN: soft, nontender, no rebound or guarding, no LUQ tenderness GU:no cva tenderness NEURO: Pt is awake/alert/appropriate, moves all extremitiesx4.  No facial droop.   EXTREMITIES: pulses normal/equal, full ROM, All other extremities/joints palpated/ranged and nontender SKIN: warm, color normal PSYCH: no abnormalities of mood noted, alert and oriented to situation  ED Course  Procedures  9:22 AM Pt declined pain meds He reports tetanus is UTD Aside from nose abrasion, there is no evidence facial/nasal bruising or deformity.  He has no tenderness to face.  He has no signs of head injury.  He denies LOC.  He denies HA.  I don't feel emergent facial/head imaging is required.  He agrees and he would like to  defer CT head.   I suspect most of the impact was on his chest wall.  No signs of PTX by CXR.  No signs of abdominal injury. No signs of spinal injury He is ambulatory We discussed strict return precautions Imaging Review Dg Ribs Unilateral W/chest Left  12/23/2014   CLINICAL DATA:  [place] on ice this morning, LEFT side posterior rib pain, history hypertension, GERD, coronary artery disease, chronic systolic CHF, ischemic cardiomyopathy  EXAM: LEFT RIBS AND CHEST - 3+ VIEW  COMPARISON:  Chest radiographs 04/04/2013  FINDINGS: Normal heart size post CABG.  Mediastinal contours and pulmonary vascularity.  Probable BILATERAL nipple shadows.  Mild bronchitic and suspect emphysematous changes.  No infiltrate, pleural effusion or pneumothorax.  Bones appear demineralized.  BB placed at site of symptoms lower lateral LEFT chest.  No rib fracture or bone destruction identified.  IMPRESSION: No acute abnormalities.   Electronically Signed   By: Lavonia Dana M.D.   On: 12/23/2014 09:17     MDM   Final diagnoses:  Abrasion of nose, initial encounter  Blunt chest trauma, initial encounter    Nursing notes including past medical history and social history reviewed and considered in documentation xrays/imaging reviewed by myself and considered during evaluation Previous records reviewed and considered     Sharyon Cable, MD 12/23/14 [phone]

## 2015-01-20 ENCOUNTER — Encounter: Payer: Self-pay | Admitting: Family Medicine

## 2015-01-23 ENCOUNTER — Encounter (HOSPITAL_COMMUNITY): Payer: Medicare Other

## 2015-02-27 ENCOUNTER — Encounter: Payer: Self-pay | Admitting: Cardiology

## 2015-03-04 ENCOUNTER — Other Ambulatory Visit: Payer: Self-pay | Admitting: Family Medicine

## 2015-04-02 ENCOUNTER — Telehealth: Payer: Self-pay | Admitting: Family Medicine

## 2015-04-02 NOTE — Telephone Encounter (Signed)
He can have isosorbide #30 and 2 rf

## 2015-04-02 NOTE — Telephone Encounter (Signed)
Advise if ok to fill. Prescribed by Dr. Stanford Breed. Last seen by PCP 11/24/14

## 2015-04-02 NOTE — Telephone Encounter (Signed)
Relation to pt: self  Call back number: [phone] Pharmacy:  Reason for call:  Pt requesting a refill isosorbide mononitrate (IMDUR) 30 MG 24 hr tablet please send to retail  [id number] - [place], [place] - [address] AT [place] [phone] (Phone) [phone] (Fax)

## 2015-04-03 MED ORDER — ISOSORBIDE MONONITRATE ER 30 MG PO TB24: 30.0000 mg | ORAL_TABLET | Freq: Every day | ORAL | Status: DC

## 2015-04-03 NOTE — Addendum Note (Signed)
Addended by: Sharon Seller B on: 04/03/2015 07:33 AM   Modules accepted: Orders

## 2015-04-03 NOTE — Telephone Encounter (Signed)
Sent in prescription as instructed and called the patient (requested #90 day supply) to inform script taken care of for him

## 2015-04-16 DIAGNOSIS — M75102 Unspecified rotator cuff tear or rupture of left shoulder, not specified as traumatic: Secondary | ICD-10-CM | POA: Diagnosis not present

## 2015-04-28 NOTE — Progress Notes (Signed)
HPI: FU CAD; s/p CABG in 1979 and 1993, ischemic CM, systolic CHF, AAA, HTN, HL. Patient underwent cardiac catheterization in May of 2014. The left main, LAD, circumflex and RCA were occluded. The LIMA to the LAD was patent and the distal LAD was occluded after the insertion. Saphenous vein graft to the obtuse marginal was occluded. Saphenous vein graft to the acute marginal and PDA had a high-grade lesion prior to insertion into the PDA of 80-90%. Ejection fraction was 25%. PCI: Promus Premier (3.5x12 mm) DES to the [facility]. Echo (09/05/13): EF 35%, diffuse HK worsened distal septal, mid/distal inferior and apical region, grade 1 diastolic dysfunction, mild LAE. Patient had repeat catheterization in October 2014 because of recurrent chest pain. The stent placed in the saphenous vein graft to the PDA had mild in-stent restenosis. Medical therapy recommended. Abdominal ultrasound 1/16 showed an abdominal aortic aneurysm of 3.5 x 3.5 cm. Since he was last seen, the patient has dyspnea with more extreme activities but not with routine activities. It is relieved with rest. It is not associated with chest pain. There is no orthopnea, PND or pedal edema. There is no syncope or palpitations. There is no exertional chest pain.   Current Outpatient Prescriptions  Medication Sig Dispense Refill  . allopurinol (ZYLOPRIM) 300 MG tablet Take 1 tablet (300 mg total) by mouth daily with breakfast. 90 tablet 1  . aspirin EC 81 MG tablet Take 81 mg by mouth every morning.     . Cholecalciferol (VITAMIN D-3 PO) Take 2,000 Units by mouth daily with breakfast.     . clopidogrel (PLAVIX) 75 MG tablet Take 1 tablet (75 mg total) by mouth every morning. 90 tablet 3  . cyanocobalamin 2000 MCG tablet Take 2,000 mcg by mouth daily.    . finasteride (PROSCAR) 5 MG tablet Take 5 mg by mouth See admin instructions. As directed    . folic acid (FOLVITE) 962 MCG tablet Take 400 mcg by mouth 2 (two) times daily.     . isosorbide  mononitrate (IMDUR) 30 MG 24 hr tablet Take 1 tablet (30 mg total) by mouth daily. 90 tablet 1  . losartan (COZAAR) 50 MG tablet Take 1 tablet (50 mg total) by mouth daily. 90 tablet 4  . methylcellulose (CITRUCEL) oral powder Take 1 packet by mouth daily with breakfast.    . metoprolol succinate (TOPROL XL) 25 MG 24 hr tablet Take 0.5 tablets (12.5 mg total) by mouth daily. 45 tablet 4  . Misc Natural Products (OSTEO BI-FLEX ADV JOINT SHIELD) TABS Take 1 tablet by mouth 2 (two) times daily.     . multivitamin-lutein (OCUVITE-LUTEIN) CAPS capsule Take 1 capsule by mouth every morning.     . nitroGLYCERIN (NITROSTAT) 0.4 MG SL tablet Place 1 tablet (0.4 mg total) under the tongue every 5 (five) minutes as needed. For chest pain. 25 tablet 3  . simvastatin (ZOCOR) 40 MG tablet Take 1 tablet (40 mg total) by mouth every evening. 90 tablet 1   No current facility-administered medications for this visit.     Past Medical History  Diagnosis Date  . Cerumen impaction     Bilateral  . Hypertension   . Peripheral vascular disease   . Hypercholesterolemia   . GERD (gastroesophageal reflux disease)   . Diverticulosis of colon   . Amebic dysentery   . Benign prostatic hypertrophy   . Degenerative joint disease   . Gout   . Lumbar back pain   .  Anxiety   . Nephrolithiasis   . Heart murmur   . CAD (coronary artery disease)     a. s/p CABG in 1979 and 1993;  b. LHC (5/14):  LM, LAD, CFX and RCA occluded; L-LAD ok, dLAD occluded after insertion of LIMA, S-OM occluded, S-PDA/AM 80-90 => PCI with Promus DES; EF 25%  . Chronic systolic CHF (congestive heart failure)   . Ischemic cardiomyopathy     a. echo (09/05/13): EF 35%, diffuse HK worsened distal septal, mid/distal inferior and apical region, grade 1 diastolic dysfunction, mild LAE.    [name] Abdominal aortic aneurysm     a. [place] (1/14):  3.3 x 3.4 cm => f/u 11/2013  . Chicken pox as a child  . Measles as a child  . Mumps as a child  . Hearing loss  11/24/2014    Past Surgical History  Procedure Laterality Date  . Coronary artery bypass graft  1979    x4 SVG-DIAG-LAD, SVG-OM-PDA  . Coronary artery bypass graft  1993    Redo x5 by Dr Harlow Asa; Durward Fortes, SVG-OM, SVG-AM-PL  . Inguinal hernia repair  1994    Right by Dr Harlow Asa  . Inguinal hernia repair  1996    Left by Dr. Harlow Asa  . Decompressive laminectomy  01/2006    L2 - scarum by Dr. Shellia Carwin  . Coronary angioplasty with stent placement  04/18/2013    RCA       . Hemorrhoid surgery      fissure with hemorrhoid corrected at age 14  . Tonsillectomy    . Percutaneous coronary stent intervention (pci-s) N/A 04/18/2013    Procedure: PERCUTANEOUS CORONARY STENT INTERVENTION (PCI-S);  Surgeon: Sherren Mocha, MD;  Location: [facility] CATH LAB;  Service: Cardiovascular;  Laterality: N/A;  . Left heart catheterization with coronary angiogram N/A 09/23/2013    Procedure: LEFT HEART CATHETERIZATION WITH CORONARY ANGIOGRAM;  Surgeon: Blane Ohara, MD;  Location: [facility] CATH LAB;  Service: Cardiovascular;  Laterality: N/A;    History   Social History  . Marital Status: Married    Spouse Name: [name] x 7 yrs  . Number of Children: N/A  . Years of Education: N/A   Occupational History  . 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 Smoking status: Former Smoker    Quit date: 11/28/1944  . Smokeless tobacco: Never Used  . Alcohol Use: 7.0 oz/week    14 drink(s) per week     Comment: daily rum  or wine  . Drug Use: No  . Sexual Activity: Not Currently     Comment: lives with wife, no dietary restrictions.    Other Topics Concern  . Not on file   Social History Narrative   Married   7 children    ROS: no fevers or chills, productive cough, hemoptysis, dysphasia, odynophagia, melena, hematochezia, dysuria, hematuria, rash, seizure activity, orthopnea, PND, pedal edema, claudication. Remaining systems are negative.  Physical  Exam: Well-developed well-nourished in no acute distress.  Skin is warm and dry.  HEENT is normal.  Neck is supple.  Chest is clear to auscultation with normal expansion.  Cardiovascular exam is regular rate and rhythm.  Abdominal exam nontender or distended. No masses palpated. Extremities show no edema. neuro grossly intact  ECG sinus rhythm, left bundle branch block.

## 2015-04-29 ENCOUNTER — Encounter: Payer: Self-pay | Admitting: Cardiology

## 2015-04-29 ENCOUNTER — Ambulatory Visit (INDEPENDENT_AMBULATORY_CARE_PROVIDER_SITE_OTHER): Payer: Medicare Other | Admitting: Cardiology

## 2015-04-29 VITALS — BP 130/72 | HR 60 | Ht 72.0 in | Wt 168.0 lb

## 2015-04-29 DIAGNOSIS — I2510 Atherosclerotic heart disease of native coronary artery without angina pectoris: Secondary | ICD-10-CM | POA: Diagnosis not present

## 2015-04-29 DIAGNOSIS — I714 Abdominal aortic aneurysm, without rupture, unspecified: Secondary | ICD-10-CM

## 2015-04-29 DIAGNOSIS — I10 Essential (primary) hypertension: Secondary | ICD-10-CM | POA: Diagnosis not present

## 2015-04-29 DIAGNOSIS — E7800 Pure hypercholesterolemia, unspecified: Secondary | ICD-10-CM

## 2015-04-29 DIAGNOSIS — I2583 Coronary atherosclerosis due to lipid rich plaque: Principal | ICD-10-CM

## 2015-04-29 DIAGNOSIS — I255 Ischemic cardiomyopathy: Secondary | ICD-10-CM

## 2015-04-29 NOTE — Assessment & Plan Note (Signed)
Continue ARB and beta blocker. 

## 2015-04-29 NOTE — Assessment & Plan Note (Signed)
Follow-up abdominal ultrasound January 2017. 

## 2015-04-29 NOTE — Assessment & Plan Note (Signed)
Continue statin. We discussed changing to high-dose Lipitor but he declined.

## 2015-04-29 NOTE — Patient Instructions (Signed)
Your physician wants you to follow-up in: 6 MONTHS WITH DR CRENSHAW You will receive a reminder letter in the mail two months in advance. If you don't receive a letter, please call our office to schedule the follow-up appointment.  

## 2015-04-29 NOTE — Assessment & Plan Note (Signed)
Continue aspirin, Plavix and statin. 

## 2015-04-29 NOTE — Assessment & Plan Note (Signed)
Blood pressure controlled. Continue present medications. 

## 2015-04-30 ENCOUNTER — Telehealth: Payer: Self-pay | Admitting: Family Medicine

## 2015-04-30 NOTE — Telephone Encounter (Signed)
Not sure what he means by not monitored properly, he is welcome to come in more often, would happily see him every 2-3 months. His labs were all good last time and he saw cardiology yesterday. We will do his labs at visit. He does not have to fast, latest studies say the labs do not change much if we let  People eat.

## 2015-04-30 NOTE — Telephone Encounter (Signed)
Called the patient left msg. To call back 

## 2015-04-30 NOTE — Telephone Encounter (Signed)
Called patient to remind him of his appt for tomorrow and he states that he feels like his Health is not being monitored properly?? Also, he wants to know if he needs to come in fasting for tomorrow visit?

## 2015-05-01 ENCOUNTER — Encounter: Payer: Self-pay | Admitting: Family Medicine

## 2015-05-01 ENCOUNTER — Telehealth: Payer: Self-pay | Admitting: Cardiology

## 2015-05-01 ENCOUNTER — Ambulatory Visit (INDEPENDENT_AMBULATORY_CARE_PROVIDER_SITE_OTHER): Payer: Medicare Other | Admitting: Family Medicine

## 2015-05-01 VITALS — BP 104/68 | HR 54 | Temp 97.9°F | Ht 72.0 in | Wt 168.2 lb

## 2015-05-01 DIAGNOSIS — I255 Ischemic cardiomyopathy: Secondary | ICD-10-CM

## 2015-05-01 DIAGNOSIS — R351 Nocturia: Secondary | ICD-10-CM

## 2015-05-01 DIAGNOSIS — K219 Gastro-esophageal reflux disease without esophagitis: Secondary | ICD-10-CM

## 2015-05-01 DIAGNOSIS — M1A09X0 Idiopathic chronic gout, multiple sites, without tophus (tophi): Secondary | ICD-10-CM

## 2015-05-01 DIAGNOSIS — Z0000 Encounter for general adult medical examination without abnormal findings: Secondary | ICD-10-CM | POA: Diagnosis not present

## 2015-05-01 DIAGNOSIS — E7800 Pure hypercholesterolemia, unspecified: Secondary | ICD-10-CM

## 2015-05-01 DIAGNOSIS — E785 Hyperlipidemia, unspecified: Secondary | ICD-10-CM

## 2015-05-01 DIAGNOSIS — I10 Essential (primary) hypertension: Secondary | ICD-10-CM

## 2015-05-01 LAB — URINALYSIS
BILIRUBIN URINE: NEGATIVE
Hgb urine dipstick: NEGATIVE
Ketones, ur: NEGATIVE
Leukocytes, UA: NEGATIVE
Nitrite: NEGATIVE
Specific Gravity, Urine: 1.025 (ref 1.000–1.030)
TOTAL PROTEIN, URINE-UPE24: NEGATIVE
Urine Glucose: NEGATIVE
Urobilinogen, UA: 0.2 (ref 0.0–1.0)
pH: 5 (ref 5.0–8.0)

## 2015-05-01 LAB — COMPREHENSIVE METABOLIC PANEL
ALT: 26 U/L (ref 0–53)
AST: 23 U/L (ref 0–37)
Albumin: 3.8 g/dL (ref 3.5–5.2)
Alkaline Phosphatase: 68 U/L (ref 39–117)
BUN: 30 mg/dL — ABNORMAL HIGH (ref 6–23)
CO2: 27 mEq/L (ref 19–32)
Calcium: 9.1 mg/dL (ref 8.4–10.5)
Chloride: 103 mEq/L (ref 96–112)
Creatinine, Ser: 1.06 mg/dL (ref 0.40–1.50)
GFR: 69.91 mL/min (ref >60.00)
Glucose, Bld: 94 mg/dL (ref 70–99)
Potassium: 4.1 mEq/L (ref 3.5–5.1)
Sodium: 135 mEq/L (ref 135–145)
Total Bilirubin: 1.3 mg/dL — ABNORMAL HIGH (ref 0.2–1.2)
Total Protein: 6.6 g/dL (ref 6.0–8.3)

## 2015-05-01 LAB — CBC
HCT: 41.1 % (ref 39.0–52.0)
Hemoglobin: 14 g/dL (ref 13.0–17.0)
MCHC: 34 g/dL (ref 30.0–36.0)
MCV: 96.9 fl (ref 78.0–100.0)
Platelets: 210 10*3/uL (ref 150.0–400.0)
RBC: 4.24 Mil/uL (ref 4.22–5.81)
RDW: 14.6 % (ref 11.5–15.5)
WBC: 8.8 10*3/uL (ref 4.0–10.5)

## 2015-05-01 LAB — LIPID PANEL
CHOL/HDL RATIO: 3
Cholesterol: 102 mg/dL (ref 0–200)
HDL: 39.5 mg/dL (ref >39.00)
LDL CALC: 47 mg/dL (ref 0–99)
NONHDL: 62.5
TRIGLYCERIDES: 76 mg/dL (ref 0.0–149.0)
VLDL: 15.2 mg/dL (ref 0.0–40.0)

## 2015-05-01 LAB — TSH: TSH: 2.3 u[IU]/mL (ref 0.35–4.50)

## 2015-05-01 LAB — URIC ACID: Uric Acid, Serum: 3.9 mg/dL — ABNORMAL LOW (ref 4.0–7.8)

## 2015-05-01 NOTE — Telephone Encounter (Signed)
Left message for pt to call.

## 2015-05-01 NOTE — Telephone Encounter (Signed)
Patient informed of PCP instructions. 

## 2015-05-01 NOTE — Progress Notes (Signed)
Pre visit review using our clinic review tool, if applicable. No additional management support is needed unless otherwise documented below in the visit note. 

## 2015-05-01 NOTE — Telephone Encounter (Signed)
Pt says there is a descreption in his medicine list.What he gave you the other day,is different from what he received on his papers when he was discharged.

## 2015-05-01 NOTE — Patient Instructions (Signed)
Try a Vitamin B complex tab daily, if it has 500 mcg or less then continue your Vitamin B12 tab if >500 mcg hold vitamin B12 tab for now Consider sleep study if fatigue worsens or snoring worsens   Bradycardia Bradycardia is a term for a heart rate (pulse) that, in adults, is slower than 60 beats per minute. A normal rate is 60 to 100 beats per minute. A heart rate below 60 beats per minute may be normal for some adults with healthy hearts. If the rate is too slow, the heart may have trouble pumping the volume of blood the body needs. If the heart rate gets too low, blood flow to the brain may be decreased and may make you feel lightheaded, dizzy, or faint. The heart has a natural pacemaker in the top of the heart called the SA node (sinoatrial or sinus node). This pacemaker sends out regular electrical signals to the muscle of the heart, telling the heart muscle when to beat (contract). The electrical signal travels from the upper parts of the heart (atria) through the AV node (atrioventricular node), to the lower chambers of the heart (ventricles). The ventricles squeeze, pumping the blood from your heart to your lungs and to the rest of your body. CAUSES   Problem with the heart's electrical system.  Problem with the heart's natural pacemaker.  Heart disease, damage, or infection.  Medications.  Problems with minerals and salts (electrolytes). SYMPTOMS   Fainting (syncope).  Fatigue and weakness.  Shortness of breath (dyspnea).  Chest pain (angina).  Drowsiness.  Confusion. DIAGNOSIS   An electrocardiogram (ECG) can help your caregiver determine the type of slow heart rate you have.  If the cause is not seen on an ECG, you may need to wear a heart monitor that records your heart rhythm for several hours or days.  Blood tests. TREATMENT   Electrolyte supplements.  Medications.  Withholding medication which is causing a slow heart rate.  Pacemaker placement. SEEK  IMMEDIATE MEDICAL CARE IF:   You feel lightheaded or faint.  You develop an irregular heart rate.  You feel chest pain or have trouble breathing. MAKE SURE YOU:   Understand these instructions.  Will watch your condition.  Will get help right away if you are not doing well or get worse. Document Released: 08/06/2002 Document Revised: 02/06/2012 Document Reviewed: 02/19/2014 [facility] Patient Information 2015 [place], [place]. This information is not intended to replace advice given to you by your health care provider. Make sure you discuss any questions you have with your health care provider.

## 2015-05-02 ENCOUNTER — Encounter: Payer: Self-pay | Admitting: Family Medicine

## 2015-05-02 LAB — URINE CULTURE
Colony Count: NO GROWTH
Organism ID, Bacteria: NO GROWTH

## 2015-05-04 ENCOUNTER — Encounter: Payer: Self-pay | Admitting: Family Medicine

## 2015-05-11 ENCOUNTER — Other Ambulatory Visit: Payer: Self-pay | Admitting: Family Medicine

## 2015-05-13 NOTE — Telephone Encounter (Signed)
Left message for pt to call.

## 2015-05-14 MED ORDER — NITROGLYCERIN 0.4 MG SL SUBL: 0.4000 mg | SUBLINGUAL_TABLET | SUBLINGUAL | Status: DC | PRN

## 2015-05-14 NOTE — Telephone Encounter (Signed)
Spoke with pt, everything on his current medication list is correct.

## 2015-05-16 NOTE — Assessment & Plan Note (Signed)
Avoid offending foods, take probiotics. Do not eat large meals in late evening and consider raising head of bed.  

## 2015-05-16 NOTE — Assessment & Plan Note (Signed)
Well controlled, no changes to meds. Encouraged heart healthy diet such as the DASH diet and exercise as tolerated.  °

## 2015-05-16 NOTE — Assessment & Plan Note (Signed)
Tolerating statin, encouraged heart healthy diet, avoid trans fats, minimize simple carbs and saturated fats. Increase exercise as tolerated 

## 2015-05-16 NOTE — Progress Notes (Signed)
Daniel Reeves  [id number] [birth year] 05/16/2015      Progress Note-Follow Up  Subjective  Chief Complaint  Chief Complaint  Patient presents with  . Follow-up    HPI  Patient is a 81 y.o. male in today for routine medical care. Patient is in today for follow-up. Is complaining of fatigue but no other new or acute complaints. Has had some intermittent discomfort in his ear but none today. No fevers or chills. No hearing loss. Denies CP/palp/SOB/HA/congestion/fevers/GI or GU c/o. Taking meds as prescribed  Past Medical History  Diagnosis Date  . Cerumen impaction     Bilateral  . Hypertension   . Peripheral vascular disease   . Hypercholesterolemia   . GERD (gastroesophageal reflux disease)   . Diverticulosis of colon   . Amebic dysentery   . Benign prostatic hypertrophy   . Degenerative joint disease   . Gout   . Lumbar back pain   . Anxiety   . Nephrolithiasis   . Heart murmur   . CAD (coronary artery disease)     a. s/p CABG in 1979 and 1993;  b. LHC (5/14):  LM, LAD, CFX and RCA occluded; L-LAD ok, dLAD occluded after insertion of LIMA, S-OM occluded, S-PDA/AM 80-90 => PCI with Promus DES; EF 25%  . Chronic systolic CHF (congestive heart failure)   . Ischemic cardiomyopathy     a. echo (09/05/13): EF 35%, diffuse HK worsened distal septal, mid/distal inferior and apical region, grade 1 diastolic dysfunction, mild LAE.    [name] Abdominal aortic aneurysm     a. [place] (1/14):  3.3 x 3.4 cm => f/u 11/2013  . Chicken pox as a child  . Measles as a child  . Mumps as a child  . Hearing loss 11/24/2014    Past Surgical History  Procedure Laterality Date  . Coronary artery bypass graft  1979    x4 SVG-DIAG-LAD, SVG-OM-PDA  . Coronary artery bypass graft  1993    Redo x5 by Dr Harlow Asa; Durward Fortes, SVG-OM, SVG-AM-PL  . Inguinal hernia repair  1994    Right by Dr Harlow Asa  . Inguinal hernia repair  1996    Left by Dr. Harlow Asa  . Decompressive laminectomy  01/2006    L2 -  scarum by Dr. Shellia Carwin  . Coronary angioplasty with stent placement  04/18/2013    RCA       . Hemorrhoid surgery      fissure with hemorrhoid corrected at age 78  . Tonsillectomy    . Percutaneous coronary stent intervention (pci-s) N/A 04/18/2013    Procedure: PERCUTANEOUS CORONARY STENT INTERVENTION (PCI-S);  Surgeon: Sherren Mocha, MD;  Location: [facility] CATH LAB;  Service: Cardiovascular;  Laterality: N/A;  . Left heart catheterization with coronary angiogram N/A 09/23/2013    Procedure: LEFT HEART CATHETERIZATION WITH CORONARY ANGIOGRAM;  Surgeon: Blane Ohara, MD;  Location: [facility] CATH LAB;  Service: Cardiovascular;  Laterality: N/A;    Family History  Problem Relation Age of Onset  . Parkinsonism Brother   . Diabetes Maternal Grandmother   . Depression Daughter   . Other Son     4 stents  . Heart disease Son   . Diabetes Son     type 2    History   Social History  . Marital Status: Married    Spouse Name: [name] x 57 yrs  . Number of Children: N/A  . Years of Education: N/A   Occupational History  . 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 Smoking status: Former Smoker    Quit date: 11/28/1944  . Smokeless tobacco: Never Used  . Alcohol Use: 7.0 oz/week    14 drink(s) per week     Comment: daily rum  or wine  . Drug Use: No  . Sexual Activity: Not Currently     Comment: lives with wife, no dietary restrictions.    Other Topics Concern  . Not on file   Social History Narrative   Married   7 children    Current Outpatient Prescriptions on File Prior to Visit  Medication Sig Dispense Refill  . allopurinol (ZYLOPRIM) 300 MG tablet Take 1 tablet (300 mg total) by mouth daily with breakfast. 90 tablet 1  . aspirin EC 81 MG tablet Take 81 mg by mouth every morning.     . Cholecalciferol (VITAMIN D-3 PO) Take 1,000 Units by mouth daily with breakfast.     . clopidogrel (PLAVIX) 75 MG tablet Take 1 tablet (75 mg total) by mouth  every morning. 90 tablet 3  . finasteride (PROSCAR) 5 MG tablet Takes every third day    . folic acid (FOLVITE) 381 MCG tablet Take 400 mcg by mouth 2 (two) times daily.     . isosorbide mononitrate (IMDUR) 30 MG 24 hr tablet Take 1 tablet (30 mg total) by mouth daily. 90 tablet 1  . losartan (COZAAR) 50 MG tablet Take 1 tablet (50 mg total) by mouth daily. 90 tablet 4  . methylcellulose (CITRUCEL) oral powder Takes 2 grams daily    . metoprolol succinate (TOPROL XL) 25 MG 24 hr tablet Take 0.5 tablets (12.5 mg total) by mouth daily. 45 tablet 4  . Misc Natural Products (OSTEO BI-FLEX ADV JOINT SHIELD) TABS Take 1 tablet by mouth 2 (two) times daily.     . multivitamin-lutein (OCUVITE-LUTEIN) CAPS capsule Take 1 capsule by mouth every morning.      No current facility-administered medications on file prior to visit.    Allergies  Allergen Reactions  . Lisinopril     REACTION: dizziness  . Methocarbamol     REACTION: pt states "dizzy"  . Pregabalin     REACTION: pt states "dizzy"  . Ramipril     REACTION: hives and dizziness    Review of Systems  Review of Systems  Constitutional: Positive for malaise/fatigue. Negative for fever.  HENT: Positive for congestion.   Eyes: Negative for discharge.  Respiratory: Negative for shortness of breath.   Cardiovascular: Negative for chest pain, palpitations and leg swelling.  Gastrointestinal: Negative for nausea, abdominal pain and diarrhea.  Genitourinary: Negative for dysuria.  Musculoskeletal: Negative for falls.  Skin: Negative for rash.  Neurological: Negative for loss of consciousness and headaches.  Endo/Heme/Allergies: Negative for polydipsia.  Psychiatric/Behavioral: Negative for depression and suicidal ideas. The patient is not nervous/anxious and does not have insomnia.     Objective  BP 104/68 mmHg  Pulse 54  Temp(Src) 97.9 F (36.6 C) (Oral)  Ht 6' (1.829 m)  Wt 168 lb 4 oz (76.318 kg)  BMI 22.81 kg/m2  SpO2  97%  Physical Exam  Physical Exam  Constitutional: He is oriented to person, place, and time and well-developed, well-nourished, and in no distress. No distress.  HENT:  Head: Normocephalic and atraumatic.  Eyes: Conjunctivae are normal.  Neck: Neck supple. No thyromegaly present.  Cardiovascular: Normal rate, regular rhythm and normal heart sounds.   No murmur heard. Pulmonary/Chest:  Effort normal and breath sounds normal. No respiratory distress.  Abdominal: He exhibits no distension and no mass. There is no tenderness.  Musculoskeletal: He exhibits no edema.  Neurological: He is alert and oriented to person, place, and time.  Skin: Skin is warm.  Psychiatric: Memory, affect and judgment normal.    Lab Results  Component Value Date   TSH 2.30 05/01/2015   Lab Results  Component Value Date   WBC 8.8 05/01/2015   HGB 14.0 05/01/2015   HCT 41.1 05/01/2015   MCV 96.9 05/01/2015   PLT 210.0 05/01/2015   Lab Results  Component Value Date   CREATININE 1.06 05/01/2015   BUN 30* 05/01/2015   NA 135 05/01/2015   K 4.1 05/01/2015   CL 103 05/01/2015   CO2 27 05/01/2015   Lab Results  Component Value Date   ALT 26 05/01/2015   AST 23 05/01/2015   ALKPHOS 68 05/01/2015   BILITOT 1.3* 05/01/2015   Lab Results  Component Value Date   CHOL 102 05/01/2015   Lab Results  Component Value Date   HDL 39.50 05/01/2015   Lab Results  Component Value Date   LDLCALC 47 05/01/2015   Lab Results  Component Value Date   TRIG 76.0 05/01/2015   Lab Results  Component Value Date   CHOLHDL 3 05/01/2015     Assessment & Plan  Essential hypertension Well controlled, no changes to meds. Encouraged heart healthy diet such as the DASH diet and exercise as tolerated.   GERD Avoid offending foods, take probiotics. Do not eat large meals in late evening and consider raising head of bed.   HYPERCHOLESTEROLEMIA Tolerating statin, encouraged heart healthy diet, avoid trans fats,  minimize simple carbs and saturated fats. Increase exercise as tolerated

## 2015-05-20 ENCOUNTER — Encounter: Payer: Self-pay | Admitting: Family Medicine

## 2015-05-21 ENCOUNTER — Other Ambulatory Visit: Payer: Self-pay | Admitting: Family Medicine

## 2015-05-25 ENCOUNTER — Other Ambulatory Visit: Payer: Self-pay

## 2015-05-28 ENCOUNTER — Ambulatory Visit: Payer: Medicare Other | Admitting: Family Medicine

## 2015-05-29 ENCOUNTER — Ambulatory Visit: Payer: Medicare Other | Admitting: Family Medicine

## 2015-06-16 ENCOUNTER — Emergency Department (HOSPITAL_BASED_OUTPATIENT_CLINIC_OR_DEPARTMENT_OTHER): Payer: Medicare Other

## 2015-06-16 ENCOUNTER — Encounter (HOSPITAL_BASED_OUTPATIENT_CLINIC_OR_DEPARTMENT_OTHER): Payer: Self-pay

## 2015-06-16 ENCOUNTER — Emergency Department (HOSPITAL_BASED_OUTPATIENT_CLINIC_OR_DEPARTMENT_OTHER)
Admission: EM | Admit: 2015-06-16 | Discharge: 2015-06-16 | Disposition: A | Discharge status: Home / Self Care | Payer: Medicare Other | Attending: Emergency Medicine | Admitting: Emergency Medicine

## 2015-06-16 DIAGNOSIS — R011 Cardiac murmur, unspecified: Secondary | ICD-10-CM | POA: Insufficient documentation

## 2015-06-16 DIAGNOSIS — R51 Headache: Secondary | ICD-10-CM | POA: Insufficient documentation

## 2015-06-16 DIAGNOSIS — I2510 Atherosclerotic heart disease of native coronary artery without angina pectoris: Secondary | ICD-10-CM | POA: Insufficient documentation

## 2015-06-16 DIAGNOSIS — M109 Gout, unspecified: Secondary | ICD-10-CM | POA: Insufficient documentation

## 2015-06-16 DIAGNOSIS — I10 Essential (primary) hypertension: Secondary | ICD-10-CM | POA: Diagnosis not present

## 2015-06-16 DIAGNOSIS — Z951 Presence of aortocoronary bypass graft: Secondary | ICD-10-CM | POA: Insufficient documentation

## 2015-06-16 DIAGNOSIS — H9190 Unspecified hearing loss, unspecified ear: Secondary | ICD-10-CM | POA: Insufficient documentation

## 2015-06-16 DIAGNOSIS — Z9861 Coronary angioplasty status: Secondary | ICD-10-CM | POA: Diagnosis not present

## 2015-06-16 DIAGNOSIS — Z8659 Personal history of other mental and behavioral disorders: Secondary | ICD-10-CM | POA: Insufficient documentation

## 2015-06-16 DIAGNOSIS — Z79899 Other long term (current) drug therapy: Secondary | ICD-10-CM | POA: Insufficient documentation

## 2015-06-16 DIAGNOSIS — E780 Pure hypercholesterolemia: Secondary | ICD-10-CM | POA: Diagnosis not present

## 2015-06-16 DIAGNOSIS — Z87442 Personal history of urinary calculi: Secondary | ICD-10-CM | POA: Diagnosis not present

## 2015-06-16 DIAGNOSIS — Z7902 Long term (current) use of antithrombotics/antiplatelets: Secondary | ICD-10-CM | POA: Diagnosis not present

## 2015-06-16 DIAGNOSIS — I5022 Chronic systolic (congestive) heart failure: Secondary | ICD-10-CM | POA: Diagnosis not present

## 2015-06-16 DIAGNOSIS — Z87891 Personal history of nicotine dependence: Secondary | ICD-10-CM | POA: Diagnosis not present

## 2015-06-16 DIAGNOSIS — I739 Peripheral vascular disease, unspecified: Secondary | ICD-10-CM | POA: Diagnosis not present

## 2015-06-16 DIAGNOSIS — M1990 Unspecified osteoarthritis, unspecified site: Secondary | ICD-10-CM | POA: Diagnosis not present

## 2015-06-16 DIAGNOSIS — R42 Dizziness and giddiness: Secondary | ICD-10-CM | POA: Diagnosis not present

## 2015-06-16 DIAGNOSIS — N400 Enlarged prostate without lower urinary tract symptoms: Secondary | ICD-10-CM | POA: Diagnosis not present

## 2015-06-16 DIAGNOSIS — H5711 Ocular pain, right eye: Secondary | ICD-10-CM | POA: Insufficient documentation

## 2015-06-16 DIAGNOSIS — Z7982 Long term (current) use of aspirin: Secondary | ICD-10-CM | POA: Insufficient documentation

## 2015-06-16 DIAGNOSIS — Z8719 Personal history of other diseases of the digestive system: Secondary | ICD-10-CM | POA: Diagnosis not present

## 2015-06-16 DIAGNOSIS — Z8619 Personal history of other infectious and parasitic diseases: Secondary | ICD-10-CM | POA: Diagnosis not present

## 2015-06-16 DIAGNOSIS — R519 Headache, unspecified: Secondary | ICD-10-CM

## 2015-06-16 NOTE — ED Notes (Signed)
Pt reports right sided headache since this morning. Pt went to bed last night at 2130 and was okay then. Pt shows unsteady gait when getting wheelchair. Also sts he feels like hes going to "pass out."

## 2015-06-16 NOTE — Discharge Instructions (Signed)

## 2015-06-16 NOTE — ED Provider Notes (Signed)
CSN: [id number]     Arrival date & time 06/16/15  1727 History   First MD Initiated Contact with Patient 06/16/15 1733     Chief Complaint  Patient presents with  . Headache     (Consider location/radiation/quality/duration/timing/severity/associated sxs/prior Treatment) Patient is a 81 y.o. male presenting with headaches. The history is provided by the patient.  Headache Associated symptoms: eye pain   Associated symptoms: no back pain, no fever and no photophobia    patient developed some pain in his right eye today. States it is dull and feels as if it was overused. Has an appointment with his ophthalmologist tomorrow. No vision changes. He also has sort of a dull right-sided headache. Does not tend to get headaches. Began the same time history changes. No numbness or weakness. No confusion. States he was working outside in the garden today. He felt a little lightheaded when he stood up that is not unusual for him. No chest pain. No fevers. No cough. No numbness or weakness. Patient states he has been reading a lot at home.  Past Medical History  Diagnosis Date  . Cerumen impaction     Bilateral  . Hypertension   . Peripheral vascular disease   . Hypercholesterolemia   . GERD (gastroesophageal reflux disease)   . Diverticulosis of colon   . Amebic dysentery   . Benign prostatic hypertrophy   . Degenerative joint disease   . Gout   . Lumbar back pain   . Anxiety   . Nephrolithiasis   . Heart murmur   . CAD (coronary artery disease)     a. s/p CABG in 1979 and 1993;  b. LHC (5/14):  LM, LAD, CFX and RCA occluded; L-LAD ok, dLAD occluded after insertion of LIMA, S-OM occluded, S-PDA/AM 80-90 => PCI with Promus DES; EF 25%  . Chronic systolic CHF (congestive heart failure)   . Ischemic cardiomyopathy     a. echo (09/05/13): EF 35%, diffuse HK worsened distal septal, mid/distal inferior and apical region, grade 1 diastolic dysfunction, mild LAE.    [name] Abdominal aortic aneurysm    a. [place] (1/14):  3.3 x 3.4 cm => f/u 11/2013  . Chicken pox as a child  . Measles as a child  . Mumps as a child  . Hearing loss 11/24/2014   Past Surgical History  Procedure Laterality Date  . Coronary artery bypass graft  1979    x4 SVG-DIAG-LAD, SVG-OM-PDA  . Coronary artery bypass graft  1993    Redo x5 by Dr Harlow Asa; Durward Fortes, SVG-OM, SVG-AM-PL  . Inguinal hernia repair  1994    Right by Dr Harlow Asa  . Inguinal hernia repair  1996    Left by Dr. Harlow Asa  . Decompressive laminectomy  01/2006    L2 - scarum by Dr. Shellia Carwin  . Coronary angioplasty with stent placement  04/18/2013    RCA       . Hemorrhoid surgery      fissure with hemorrhoid corrected at age 80  . Tonsillectomy    . Percutaneous coronary stent intervention (pci-s) N/A 04/18/2013    Procedure: PERCUTANEOUS CORONARY STENT INTERVENTION (PCI-S);  Surgeon: Sherren Mocha, MD;  Location: [facility] CATH LAB;  Service: Cardiovascular;  Laterality: N/A;  . Left heart catheterization with coronary angiogram N/A 09/23/2013    Procedure: LEFT HEART CATHETERIZATION WITH CORONARY ANGIOGRAM;  Surgeon: Blane Ohara, MD;  Location: [facility] CATH LAB;  Service: Cardiovascular;  Laterality: N/A;   Family History  Problem Relation Age  of Onset  . Parkinsonism Brother   . Diabetes Maternal Grandmother   . Depression Daughter   . Other Son     4 stents  . Heart disease Son   . Diabetes Son     type 2   History  Substance Use Topics  . Smoking status: Former Smoker    Quit date: 11/28/1944  . Smokeless tobacco: Never Used  . Alcohol Use: 7.0 oz/week    14 drink(s) per week     Comment: daily rum  or wine    Review of Systems  Constitutional: Negative for fever and chills.  Eyes: Positive for pain. Negative for photophobia.  Respiratory: Negative for chest tightness.   Cardiovascular: Negative for chest pain.  Musculoskeletal: Negative for back pain.  Skin: Negative for wound.  Neurological: Positive for light-headedness and  headaches.      Allergies  Lisinopril; Methocarbamol; Pregabalin; and Ramipril  Home Medications   Prior to Admission medications   Medication Sig Start Date End Date Taking? Authorizing Provider  allopurinol (ZYLOPRIM) 300 MG tablet Take 1 tablet (300 mg total) by mouth daily with breakfast. 03/04/15   [name], MD  aspirin EC 81 MG tablet Take 81 mg by mouth every morning.     Historical Provider, MD  B Complex-C (B-COMPLEX WITH VITAMIN C) tablet Take 1 tablet by mouth daily.    Historical Provider, MD  Cholecalciferol (VITAMIN D-3 PO) Take 1,000 Units by mouth daily with breakfast.     Historical Provider, MD  clopidogrel (PLAVIX) 75 MG tablet Take 1 tablet (75 mg total) by mouth every morning. 05/19/14   Lelon Perla, MD  finasteride (PROSCAR) 5 MG tablet Takes every third day    Historical Provider, MD  folic acid (FOLVITE) 595 MCG tablet Take 400 mcg by mouth 2 (two) times daily.     Historical Provider, MD  isosorbide mononitrate (IMDUR) 30 MG 24 hr tablet Take 1 tablet (30 mg total) by mouth daily. 04/03/15   [name], MD  losartan (COZAAR) 50 MG tablet Take 1 tablet (50 mg total) by mouth daily. 10/08/14   Lelon Perla, MD  methylcellulose (CITRUCEL) oral powder Takes 2 grams daily    Historical Provider, MD  metoprolol succinate (TOPROL XL) 25 MG 24 hr tablet Take 0.5 tablets (12.5 mg total) by mouth daily. 10/08/14   Lelon Perla, MD  Misc Natural Products (OSTEO BI-FLEX ADV JOINT SHIELD) TABS Take 1 tablet by mouth 2 (two) times daily.     Historical Provider, MD  multivitamin-lutein (OCUVITE-LUTEIN) CAPS capsule Take 1 capsule by mouth every morning.     Historical Provider, MD  nitroGLYCERIN (NITROSTAT) 0.4 MG SL tablet Place 1 tablet (0.4 mg total) under the tongue every 5 (five) minutes as needed. For chest pain. 05/14/15   Lelon Perla, MD  simvastatin (ZOCOR) 40 MG tablet TAKE 1 BY MOUTH EVERY EVENING 05/11/15   [name], MD   BP 142/63 mmHg   Pulse 64  Temp(Src) 98.2 F (36.8 C) (Oral)  Resp 18  Ht 5\' 11"  (1.803 m)  Wt 175 lb (79.379 kg)  BMI 24.42 kg/m2  SpO2 99% Physical Exam  Constitutional: He is oriented to person, place, and time. He appears well-developed and well-nourished.  HENT:  Head: Normocephalic and atraumatic.  Eyes: EOM are normal. Pupils are equal, round, and reactive to light. Right eye exhibits no discharge. Left eye exhibits no discharge.  Neck: Normal range of motion. Neck supple.  Cardiovascular: Normal rate, regular rhythm and normal heart sounds.   No murmur heard. Pulmonary/Chest: Effort normal and breath sounds normal.  Abdominal: Soft. Bowel sounds are normal. He exhibits no distension and no mass. There is no tenderness. There is no rebound and no guarding.  Musculoskeletal: Normal range of motion. He exhibits no edema.  Neurological: He is alert and oriented to person, place, and time. No cranial nerve deficit.  Equal smile. Extraocular movements intact. No nystagmus. Good grips bilaterally. Good flexion-extension extremity's. Good straight legs bilaterally. No change in his ambulation. No Romberg  Skin: Skin is warm and dry.  Psychiatric: He has a normal mood and affect.  Nursing note and vitals reviewed.   ED Course  Procedures (including critical care time) Labs Review Labs Reviewed - No data to display  Imaging Review Ct Head Wo Contrast  06/16/2015   CLINICAL DATA:  81 year old male with headache and right eye pain started this morning. Initial encounter.  EXAM: CT HEAD WITHOUT CONTRAST  TECHNIQUE: Contiguous axial images were obtained from the base of the skull through the vertex without intravenous contrast.  COMPARISON:  None.  FINDINGS: No intracranial hemorrhage.  Small vessel disease type changes without CT evidence of large acute infarct.  No intracranial mass lesion noted on this unenhanced exam.  Global atrophy without hydrocephalus.  No acute orbital abnormality noted.  Mastoid  air cells, middle ear cavities and visualized paranasal sinuses are clear.  No calvarial abnormality.  IMPRESSION: No intracranial hemorrhage.  Small vessel disease type changes without CT evidence of large acute infarct.  No intracranial mass lesion noted on this unenhanced exam.  Global atrophy without hydrocephalus.  No acute orbital abnormality noted.   Electronically Signed   By: Genia Del M.D.   On: 06/16/2015 18:33     EKG Interpretation   Date/Time:  Tuesday June 16 2015 17:43:38 EDT Ventricular Rate:  69 PR Interval:  200 QRS Duration: 164 QT Interval:  438 QTC Calculation: 469 R Axis:   -8 Text Interpretation:  Normal sinus rhythm Left bundle branch block  Abnormal ECG No significant change since last tracing Confirmed by  Alvino Chapel  MD, Ovid Curd [phone]) on 06/16/2015 5:54:05 PM      MDM   Final diagnoses:  Acute nonintractable headache, unspecified headache type  Eye pain, right    Patient with eye pain. Also headache. Head CT reassuring. Good vision. Will discharge home. nonfocal exam and has follow-up with his eye doctor tomorrow.  Davonna Belling, MD 06/16/15 [phone]

## 2015-06-17 DIAGNOSIS — H43311 Vitreous membranes and strands, right eye: Secondary | ICD-10-CM | POA: Diagnosis not present

## 2015-07-03 ENCOUNTER — Encounter: Payer: Self-pay | Admitting: Gastroenterology

## 2015-07-03 ENCOUNTER — Other Ambulatory Visit: Payer: Self-pay | Admitting: Cardiology

## 2015-07-17 ENCOUNTER — Telehealth: Payer: Self-pay | Admitting: Family Medicine

## 2015-07-17 MED ORDER — ISOSORBIDE MONONITRATE ER 30 MG PO TB24: ORAL_TABLET | ORAL | Status: DC

## 2015-07-17 NOTE — Telephone Encounter (Signed)
Caller name: Darnell Manuelito  Relationship to patient: Self   Can be reached: [phone] Pharmacy:  Reason for call: pt called in to get a refill on his isosorbide mononitrate medication.

## 2015-07-17 NOTE — Telephone Encounter (Signed)
Patient informed refill done 

## 2015-08-12 ENCOUNTER — Other Ambulatory Visit: Payer: Self-pay

## 2015-08-12 ENCOUNTER — Other Ambulatory Visit: Payer: Self-pay | Admitting: Family Medicine

## 2015-08-12 MED ORDER — CLOPIDOGREL BISULFATE 75 MG PO TABS: 75.0000 mg | ORAL_TABLET | Freq: Every morning | ORAL | Status: DC

## 2015-08-12 MED ORDER — ALLOPURINOL 300 MG PO TABS: 300.0000 mg | ORAL_TABLET | Freq: Every day | ORAL | Status: DC

## 2015-08-12 NOTE — Telephone Encounter (Signed)
LOV- 05/11/15 Last Uric Acid 11/24/14

## 2015-08-12 NOTE — Telephone Encounter (Signed)
Pharmacy: [facility]  Reason for call: Pt needing Allopurinol. 90 day supply thru mail order pharmacy. Has 2 weeks.

## 2015-10-07 DIAGNOSIS — R3915 Urgency of urination: Secondary | ICD-10-CM | POA: Diagnosis not present

## 2015-10-07 DIAGNOSIS — N138 Other obstructive and reflux uropathy: Secondary | ICD-10-CM | POA: Diagnosis not present

## 2015-10-07 DIAGNOSIS — N401 Enlarged prostate with lower urinary tract symptoms: Secondary | ICD-10-CM | POA: Diagnosis not present

## 2015-10-30 ENCOUNTER — Other Ambulatory Visit: Payer: Self-pay | Admitting: Family Medicine

## 2015-10-30 MED ORDER — ISOSORBIDE MONONITRATE ER 30 MG PO TB24: ORAL_TABLET | ORAL | Status: DC

## 2015-11-03 NOTE — Progress Notes (Signed)
HPI: FU CAD; s/p CABG in 1979 and 1993, ischemic CM, systolic CHF, AAA, HTN, HL. Patient underwent cardiac catheterization in May of 2014. The left main, LAD, circumflex and RCA were occluded. The LIMA to the LAD was patent and the distal LAD was occluded after the insertion. Saphenous vein graft to the obtuse marginal was occluded. Saphenous vein graft to the acute marginal and PDA had a high-grade lesion prior to insertion into the PDA of 80-90%. Ejection fraction was 25%. PCI: Promus Premier (3.5x12 mm) DES to the [facility]. Echo (09/05/13): EF 35%, diffuse HK worsened distal septal, mid/distal inferior and apical region, grade 1 diastolic dysfunction, mild LAE. Patient had repeat catheterization in October 2014 because of recurrent chest pain. The stent placed in the saphenous vein graft to the PDA had mild in-stent restenosis. Medical therapy recommended. Abdominal ultrasound 1/16 showed an abdominal aortic aneurysm of 3.5 x 3.5 cm. Since he was last seen, Patient denies dyspnea. He has had some chest discomfort with ambulating down his driveway in the morning in the cold. It resolves with rest. He does not have exertional chest pain with pushing a wheelbarrow.  Current Outpatient Prescriptions  Medication Sig Dispense Refill  . allopurinol (ZYLOPRIM) 300 MG tablet Take 1 tablet (300 mg total) by mouth daily with breakfast. 90 tablet 1  . aspirin EC 81 MG tablet Take 81 mg by mouth every morning.     . B Complex-C (B-COMPLEX WITH VITAMIN C) tablet Take 1 tablet by mouth daily.    . Cholecalciferol (VITAMIN D-3 PO) Take 1,000 Units by mouth daily with breakfast.     . clopidogrel (PLAVIX) 75 MG tablet Take 1 tablet (75 mg total) by mouth every morning. 90 tablet 3  . finasteride (PROSCAR) 5 MG tablet Takes every third day    . folic acid (FOLVITE) A999333 MCG tablet Take 400 mcg by mouth 2 (two) times daily.     . isosorbide mononitrate (IMDUR) 30 MG 24 hr tablet TAKE 1 BY MOUTH DAILY 90 tablet 1  .  losartan (COZAAR) 50 MG tablet Take 1 tablet (50 mg total) by mouth daily. 90 tablet 4  . methylcellulose (CITRUCEL) oral powder Takes 2 grams daily    . metoprolol succinate (TOPROL XL) 25 MG 24 hr tablet Take 0.5 tablets (12.5 mg total) by mouth daily. 45 tablet 4  . Misc Natural Products (OSTEO BI-FLEX ADV JOINT SHIELD) TABS Take 1 tablet by mouth 2 (two) times daily.     . nitroGLYCERIN (NITROSTAT) 0.4 MG SL tablet Place 1 tablet (0.4 mg total) under the tongue every 5 (five) minutes as needed. For chest pain. 25 tablet 12  . simvastatin (ZOCOR) 40 MG tablet TAKE 1 BY MOUTH EVERY EVENING 90 tablet 1   No current facility-administered medications for this visit.     Past Medical History  Diagnosis Date  . Cerumen impaction     Bilateral  . Hypertension   . Peripheral vascular disease ([place])   . Hypercholesterolemia   . GERD (gastroesophageal reflux disease)   . Diverticulosis of colon   . Amebic dysentery   . Benign prostatic hypertrophy   . Degenerative joint disease   . Gout   . Lumbar back pain   . Anxiety   . Nephrolithiasis   . Heart murmur   . CAD (coronary artery disease)     a. s/p CABG in 1979 and 1993;  b. LHC (5/14):  LM, LAD, CFX and RCA occluded; L-LAD ok,  dLAD occluded after insertion of LIMA, S-OM occluded, S-PDA/AM 80-90 => PCI with Promus DES; EF 25%  . Chronic systolic CHF (congestive heart failure) ([place])   . Ischemic cardiomyopathy     a. echo (09/05/13): EF 35%, diffuse HK worsened distal septal, mid/distal inferior and apical region, grade 1 diastolic dysfunction, mild LAE.    [name] Abdominal aortic aneurysm ([place])     a. [place] (1/14):  3.3 x 3.4 cm => f/u 11/2013  . Chicken pox as a child  . Measles as a child  . Mumps as a child  . Hearing loss 11/24/2014  . Atherosclerosis of coronary artery bypass graft with unstable angina pectoris ([place]) 04/19/2013  . Cardiomyopathy, ischemic 06/05/2013  . Essential hypertension 11/16/2007    Qualifier: Diagnosis of  By:  Julien Girt CMA, Marliss Czar    . HYPERCHOLESTEROLEMIA 11/16/2007    Qualifier: Diagnosis of  By: Julien Girt CMA, Marliss Czar    . PERIPHERAL VASCULAR DISEASE 11/16/2007    Qualifier: Diagnosis of  By: Julien Girt CMA, Marliss Czar    . AMEBIC DYSENTERY 11/19/2007    Qualifier: History of  By: Lenna Gilford MD, [place] 11/19/2007    Qualifier: Diagnosis of  By: Lenna Gilford MD, Deborra Medina   . BACK PAIN, LUMBAR 11/16/2007    Qualifier: Diagnosis of  By: Julien Girt CMA, [place]    . BENIGN PROSTATIC HYPERTROPHY, HX OF 11/16/2007    Qualifier: Diagnosis of  By: Julien Girt CMA, [place]    . CERUMEN IMPACTION, BILATERAL 02/03/2010    Qualifier: History of  By: Lenna Gilford MD, [place] 07/01/2008    Qualifier: Diagnosis of  By: Lenna Gilford MD, [place] DISEASE 11/16/2007    Qualifier: Diagnosis of  By: Julien Girt CMA, Marliss Czar    . DIVERTICULOSIS OF COLON 07/01/2008    Qualifier: Diagnosis of  By: Lenna Gilford MD, Deborra Medina   . FLANK PAIN, RIGHT 02/03/2010    Qualifier: History of  By: Lenna Gilford MD, Deborra Medina   . GERD 11/16/2007    Qualifier: Diagnosis of  By: Julien Girt CMA, Marliss Czar    . GOUT 11/16/2007    Qualifier: Diagnosis of  By: Julien Girt CMA, Marliss Czar    . Kidney stone 08/17/2011  . Medicare annual wellness visit, subsequent 11/24/2014    Sees Dr Delman Cheadle for dermatology Sees Dr Roni Bread of Urology Sees Dr Stanford Breed of cardiology Sees Dr Virginia Rochester of Opthamology No further colonoscopies warranted       . Pain in joint, lower leg 07/29/2014  . Shingles 07/29/2014  . Sun-damaged skin 05/31/2014  . Loss of hearing     Past Surgical History  Procedure Laterality Date  . Coronary artery bypass graft  1979    x4 SVG-DIAG-LAD, SVG-OM-PDA  . Coronary artery bypass graft  1993    Redo x5 by Dr Harlow Asa; Durward Fortes, SVG-OM, SVG-AM-PL  . Inguinal hernia repair  1994    Right by Dr Harlow Asa  . Inguinal hernia repair  1996    Left by Dr. Harlow Asa  . Decompressive laminectomy  01/2006    L2 - scarum by Dr. Shellia Carwin  . Coronary angioplasty with stent placement   04/18/2013    RCA       . Hemorrhoid surgery      fissure with hemorrhoid corrected at age 72  . Tonsillectomy    . Percutaneous coronary stent intervention (pci-s) N/A 04/18/2013    Procedure: PERCUTANEOUS CORONARY STENT INTERVENTION (PCI-S);  Surgeon: Sherren Mocha, MD;  Location: [facility]  CATH LAB;  Service: Cardiovascular;  Laterality: N/A;  . Left heart catheterization with coronary angiogram N/A 09/23/2013    Procedure: LEFT HEART CATHETERIZATION WITH CORONARY ANGIOGRAM;  Surgeon: Blane Ohara, MD;  Location: [facility] CATH LAB;  Service: Cardiovascular;  Laterality: N/A;    Social History   Social History  . Marital Status: Married    Spouse Name: [name] x 36 yrs  . Number of Children: N/A  . Years of Education: N/A   Occupational History  . 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 Smoking status: Former Smoker    Quit date: 11/28/1944  . Smokeless tobacco: Never Used  . Alcohol Use: 7.0 oz/week    14 drink(s) per week     Comment: daily rum  or wine  . Drug Use: No  . Sexual Activity: Not Currently     Comment: lives with wife, no dietary restrictions.    Other Topics Concern  . Not on file   Social History Narrative   Married   7 children    ROS: no fevers or chills, productive cough, hemoptysis, dysphasia, odynophagia, melena, hematochezia, dysuria, hematuria, rash, seizure activity, orthopnea, PND, pedal edema, claudication. Remaining systems are negative.  Physical Exam: Well-developed well-nourished in no acute distress.  Skin is warm and dry.  HEENT is normal.  Neck is supple.  Chest is clear to auscultation with normal expansion.  Cardiovascular exam is regular rate and rhythm.  Abdominal exam nontender or distended. No masses palpated. Extremities show no edema. neuro grossly intact  ECG Sinus rhythm with First-degree AV block and left bundle branch block.

## 2015-11-04 ENCOUNTER — Ambulatory Visit (INDEPENDENT_AMBULATORY_CARE_PROVIDER_SITE_OTHER): Payer: Medicare Other | Admitting: Cardiology

## 2015-11-04 ENCOUNTER — Encounter: Payer: Self-pay | Admitting: Cardiology

## 2015-11-04 VITALS — BP 154/55 | HR 58 | Ht 71.0 in | Wt 171.0 lb

## 2015-11-04 DIAGNOSIS — I255 Ischemic cardiomyopathy: Secondary | ICD-10-CM | POA: Diagnosis not present

## 2015-11-04 DIAGNOSIS — I2510 Atherosclerotic heart disease of native coronary artery without angina pectoris: Secondary | ICD-10-CM | POA: Diagnosis not present

## 2015-11-04 DIAGNOSIS — I714 Abdominal aortic aneurysm, without rupture, unspecified: Secondary | ICD-10-CM

## 2015-11-04 DIAGNOSIS — I10 Essential (primary) hypertension: Secondary | ICD-10-CM

## 2015-11-04 DIAGNOSIS — I25700 Atherosclerosis of coronary artery bypass graft(s), unspecified, with unstable angina pectoris: Secondary | ICD-10-CM

## 2015-11-04 DIAGNOSIS — I2583 Coronary atherosclerosis due to lipid rich plaque: Secondary | ICD-10-CM

## 2015-11-04 DIAGNOSIS — E7800 Pure hypercholesterolemia, unspecified: Secondary | ICD-10-CM

## 2015-11-04 MED ORDER — METOPROLOL SUCCINATE ER 25 MG PO TB24: 12.5000 mg | ORAL_TABLET | Freq: Every day | ORAL | Status: DC

## 2015-11-04 MED ORDER — ISOSORBIDE MONONITRATE ER 60 MG PO TB24: 60.0000 mg | ORAL_TABLET | Freq: Every day | ORAL | Status: DC

## 2015-11-04 NOTE — Assessment & Plan Note (Addendum)
Blood pressure Elevated.Increase Imdur to 60 mg daily and follow.

## 2015-11-04 NOTE — Assessment & Plan Note (Signed)
Plan follow-up ultrasound January 2017. 

## 2015-11-04 NOTE — Patient Instructions (Signed)
Medication Instructions:  INCREASE Isosorbide to 60 mg.  >>You make take 2 tablets of the 30 mg until they run out.  >>A new prescription has been sent to your pharmacy electronically.  Labwork: NONE  Testing/Procedures: 1. Your physician has requested that you have a lexiscan myoview. For further information please visit [email or web address]. Please follow instruction sheet, as given. 2.Your physician has requested that you have an abdominal aorta duplex. During this test, an ultrasound is used to evaluate the aorta. Allow 30 minutes for this exam. Do not eat after midnight the day before and avoid carbonated beverages  >>These will be done at our [place] office in [place], [address].   Follow-Up: Dr Stanford Breed recommends that you schedule a follow-up appointment in 8 weeks.  If you need a refill on your cardiac medications before your next appointment, please call your pharmacy.

## 2015-11-04 NOTE — Assessment & Plan Note (Signed)
Continue statin. 

## 2015-11-04 NOTE — Assessment & Plan Note (Addendum)
Continue aspirin, Plavix and statin. He is having some exertional chest tightness in the morning during cold weather. He does not have chest pain with other vigorous activities. I will plan a Lexiscan nuclear study for risk stratification. Increase imdur to 60 mg daily.

## 2015-11-04 NOTE — Assessment & Plan Note (Signed)
Continue ARB and beta blocker. 

## 2015-11-06 ENCOUNTER — Telehealth: Payer: Self-pay | Admitting: Cardiology

## 2015-11-06 NOTE — Telephone Encounter (Signed)
Pt called in wanting to speak with a nurse about his instructions for his test that have been set up . He wants to if he will be able to drive and he had other questions. I informed him that a tech from our office would be contacting him a couple of days prior to his appt and will give him instructions. That did not seem to suffice and he would still like to speak with a nurse. Please f/u with him  Thanks

## 2015-11-06 NOTE — Telephone Encounter (Signed)
Spoke to patient Patient called . He states that there are few things that were not correct on his after visit summary   1) the wrong phone number [phone]-- is not the number to reach Dr Stanford Breed if he has a question   patient sees Dr Stanford Breed at [facility] on [address]- did not actually say anything about "lexiscan" or "Myoview". Patient wanted to know what test correspond to the test he was having.  RN reviewed website- informed  Patient the test that pertain to him was ([place]) Patient verbalized understanding. Patient states he see it on his computer.  3) patient wanted to know if he will be able to drive or need some one with him for test- RN informed patient he does not need a driver.  4) patient wanted to speak to [facility]   RN informed patient she was not in office today and will be out next week , but will return his the following week.   Patient verbalized understanding.

## 2015-11-09 NOTE — Telephone Encounter (Signed)
Spoke with pt, all his questions and concerns have been taking care of.

## 2015-11-16 ENCOUNTER — Encounter: Payer: Self-pay | Admitting: Family Medicine

## 2015-11-16 ENCOUNTER — Ambulatory Visit (INDEPENDENT_AMBULATORY_CARE_PROVIDER_SITE_OTHER): Payer: Medicare Other | Admitting: Family Medicine

## 2015-11-16 VITALS — BP 128/78 | HR 52 | Temp 97.4°F | Ht 71.0 in | Wt 171.5 lb

## 2015-11-16 DIAGNOSIS — Z23 Encounter for immunization: Secondary | ICD-10-CM

## 2015-11-16 DIAGNOSIS — K219 Gastro-esophageal reflux disease without esophagitis: Secondary | ICD-10-CM

## 2015-11-16 DIAGNOSIS — I25700 Atherosclerosis of coronary artery bypass graft(s), unspecified, with unstable angina pectoris: Secondary | ICD-10-CM

## 2015-11-16 DIAGNOSIS — I2510 Atherosclerotic heart disease of native coronary artery without angina pectoris: Secondary | ICD-10-CM

## 2015-11-16 DIAGNOSIS — I10 Essential (primary) hypertension: Secondary | ICD-10-CM

## 2015-11-16 DIAGNOSIS — E7800 Pure hypercholesterolemia, unspecified: Secondary | ICD-10-CM

## 2015-11-16 DIAGNOSIS — I255 Ischemic cardiomyopathy: Secondary | ICD-10-CM | POA: Diagnosis not present

## 2015-11-16 DIAGNOSIS — I2583 Coronary atherosclerosis due to lipid rich plaque: Secondary | ICD-10-CM

## 2015-11-16 LAB — CBC
HCT: 40.7 % (ref 39.0–52.0)
Hemoglobin: 13.5 g/dL (ref 13.0–17.0)
MCHC: 33.2 g/dL (ref 30.0–36.0)
MCV: 97.8 fl (ref 78.0–100.0)
Platelets: 202 10*3/uL (ref 150.0–400.0)
RBC: 4.16 Mil/uL — ABNORMAL LOW (ref 4.22–5.81)
RDW: 14.1 % (ref 11.5–15.5)
WBC: 8.7 10*3/uL (ref 4.0–10.5)

## 2015-11-16 LAB — COMPREHENSIVE METABOLIC PANEL
ALK PHOS: 63 U/L (ref 39–117)
ALT: 19 U/L (ref 0–53)
AST: 26 U/L (ref 0–37)
Albumin: 3.9 g/dL (ref 3.5–5.2)
BUN: 22 mg/dL (ref 6–23)
CO2: 27 mEq/L (ref 19–32)
Calcium: 9.4 mg/dL (ref 8.4–10.5)
Chloride: 105 mEq/L (ref 96–112)
Creatinine, Ser: 1.08 mg/dL (ref 0.40–1.50)
GFR: 68.33 mL/min (ref >60.00)
GLUCOSE: 97 mg/dL (ref 70–99)
Potassium: 4 mEq/L (ref 3.5–5.1)
SODIUM: 140 meq/L (ref 135–145)
TOTAL PROTEIN: 6.7 g/dL (ref 6.0–8.3)
Total Bilirubin: 1.1 mg/dL (ref 0.2–1.2)

## 2015-11-16 LAB — LIPID PANEL
Cholesterol: 97 mg/dL (ref 0–200)
HDL: 33.2 mg/dL — ABNORMAL LOW (ref >39.00)
LDL CALC: 42 mg/dL (ref 0–99)
NONHDL: 63.96
Total CHOL/HDL Ratio: 3
Triglycerides: 111 mg/dL (ref 0.0–149.0)
VLDL: 22.2 mg/dL (ref 0.0–40.0)

## 2015-11-16 LAB — TSH: TSH: 2.96 u[IU]/mL (ref 0.35–4.50)

## 2015-11-16 MED ORDER — SIMVASTATIN 40 MG PO TABS: ORAL_TABLET | ORAL | Status: DC

## 2015-11-16 MED ORDER — LOSARTAN POTASSIUM 50 MG PO TABS: 50.0000 mg | ORAL_TABLET | Freq: Every day | ORAL | Status: DC

## 2015-11-16 NOTE — Progress Notes (Signed)
Subjective:    Patient ID: Daniel Reeves, male    DOB: August 28, 1924, 81 y.o.   MRN: [id number]  Chief Complaint  Patient presents with  . Follow-up    HPI Patient is in today for follow up. He is accompanied by his wife. He feels well today. He was struggling with some chest tightness when he walked to the mailbox. He has seen his cardiologist and his Isosorbide Mononitrate to 60 mg and he has not had any further chest discomfort since then. When he had the chest discomfort there were no associated symptoms. No new concerns otherwise, he has a stress test arranged with his cardiologist. Denies palp/SOB/HA/congestion/fevers/GI or GU c/o. Taking meds as prescribed  Past Medical History  Diagnosis Date  . Cerumen impaction     Bilateral  . Hypertension   . Peripheral vascular disease ([place])   . Hypercholesterolemia   . GERD (gastroesophageal reflux disease)   . Diverticulosis of colon   . Amebic dysentery   . Benign prostatic hypertrophy   . Degenerative joint disease   . Gout   . Lumbar back pain   . Anxiety   . Nephrolithiasis   . Heart murmur   . CAD (coronary artery disease)     a. s/p CABG in 1979 and 1993;  b. LHC (5/14):  LM, LAD, CFX and RCA occluded; L-LAD ok, dLAD occluded after insertion of LIMA, S-OM occluded, S-PDA/AM 80-90 => PCI with Promus DES; EF 25%  . Chronic systolic CHF (congestive heart failure) ([place])   . Ischemic cardiomyopathy     a. echo (09/05/13): EF 35%, diffuse HK worsened distal septal, mid/distal inferior and apical region, grade 1 diastolic dysfunction, mild LAE.    [name] Abdominal aortic aneurysm ([place])     a. [place] (1/14):  3.3 x 3.4 cm => f/u 11/2013  . Chicken pox as a child  . Measles as a child  . Mumps as a child  . Hearing loss 11/24/2014  . Atherosclerosis of coronary artery bypass graft with unstable angina pectoris ([place]) 04/19/2013  . Cardiomyopathy, ischemic 06/05/2013  . Essential hypertension 11/16/2007    Qualifier: Diagnosis of  By: Julien Girt  CMA, Marliss Czar    . HYPERCHOLESTEROLEMIA 11/16/2007    Qualifier: Diagnosis of  By: Julien Girt CMA, Marliss Czar    . PERIPHERAL VASCULAR DISEASE 11/16/2007    Qualifier: Diagnosis of  By: Julien Girt CMA, Marliss Czar    . AMEBIC DYSENTERY 11/19/2007    Qualifier: History of  By: Lenna Gilford MD, [place] 11/19/2007    Qualifier: Diagnosis of  By: Lenna Gilford MD, Deborra Medina   . BACK PAIN, LUMBAR 11/16/2007    Qualifier: Diagnosis of  By: Julien Girt CMA, [place]    . BENIGN PROSTATIC HYPERTROPHY, HX OF 11/16/2007    Qualifier: Diagnosis of  By: Julien Girt CMA, [place]    . CERUMEN IMPACTION, BILATERAL 02/03/2010    Qualifier: History of  By: Lenna Gilford MD, [place] 07/01/2008    Qualifier: Diagnosis of  By: Lenna Gilford MD, [place] DISEASE 11/16/2007    Qualifier: Diagnosis of  By: Julien Girt CMA, Marliss Czar    . DIVERTICULOSIS OF COLON 07/01/2008    Qualifier: Diagnosis of  By: Lenna Gilford MD, Deborra Medina   . FLANK PAIN, RIGHT 02/03/2010    Qualifier: History of  By: Lenna Gilford MD, Deborra Medina   . GERD 11/16/2007    Qualifier: Diagnosis of  By: Julien Girt CMA, Marliss Czar    .  GOUT 11/16/2007    Qualifier: Diagnosis of  By: Julien Girt CMA, Marliss Czar    . Kidney stone 08/17/2011  . Medicare annual wellness visit, subsequent 11/24/2014    Sees Dr Delman Cheadle for dermatology Sees Dr Roni Bread of Urology Sees Dr Stanford Breed of cardiology Sees Dr Virginia Rochester of Opthamology No further colonoscopies warranted       . Pain in joint, lower leg 07/29/2014  . Shingles 07/29/2014  . Sun-damaged skin 05/31/2014  . Loss of hearing     Past Surgical History  Procedure Laterality Date  . Inguinal hernia repair  1994    Right by Dr Harlow Asa  . Inguinal hernia repair  1996    Left by Dr. Harlow Asa  . Decompressive laminectomy  01/2006    L2 - scarum by Dr. Shellia Carwin  . Coronary angioplasty with stent placement  04/18/2013    RCA       . Hemorrhoid surgery      fissure with hemorrhoid corrected at age 47  . Tonsillectomy    . Percutaneous coronary stent intervention (pci-s) N/A  04/18/2013    Procedure: PERCUTANEOUS CORONARY STENT INTERVENTION (PCI-S);  Surgeon: Sherren Mocha, MD;  Location: [facility] CATH LAB;  Service: Cardiovascular;  Laterality: N/A;  . Left heart catheterization with coronary angiogram N/A 09/23/2013    Procedure: LEFT HEART CATHETERIZATION WITH CORONARY ANGIOGRAM;  Surgeon: Blane Ohara, MD;  Location: [facility] CATH LAB;  Service: Cardiovascular;  Laterality: N/A;  . Coronary artery bypass graft  1979    x4 SVG-DIAG-LAD, SVG-OM-PDA  . Coronary artery bypass graft  1993    Redo x5 by Dr Harlow Asa; Durward Fortes, SVG-OM, SVG-AM-PL    Family History  Problem Relation Age of Onset  . Parkinsonism Brother   . Diabetes Maternal Grandmother   . Depression Daughter   . Other Son     4 stents  . Heart disease Son   . Diabetes Son     type 2    Social History   Social History  . Marital Status: Married    Spouse Name: [name] x 19 yrs  . Number of Children: N/A  . Years of Education: N/A   Occupational History  . 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 Smoking status: Former Smoker    Quit date: 11/28/1944  . Smokeless tobacco: Never Used  . Alcohol Use: 7.0 oz/week    14 Standard drinks or equivalent per week     Comment: daily rum  or wine  . Drug Use: No  . Sexual Activity: Not Currently     Comment: lives with wife, no dietary restrictions.    Other Topics Concern  . Not on file   Social History Narrative   Married   7 children    Outpatient Prescriptions Prior to Visit  Medication Sig Dispense Refill  . allopurinol (ZYLOPRIM) 300 MG tablet Take 1 tablet (300 mg total) by mouth daily with breakfast. 90 tablet 1  . aspirin EC 81 MG tablet Take 81 mg by mouth every morning.     . B Complex-C (B-COMPLEX WITH VITAMIN C) tablet Take 1 tablet by mouth daily.    . Cholecalciferol (VITAMIN D-3 PO) Take 1,000 Units by mouth daily with breakfast.     . clopidogrel (PLAVIX) 75 MG tablet Take 1 tablet (75  mg total) by mouth every morning. 90 tablet 3  . finasteride (PROSCAR) 5 MG tablet Takes every third day    . folic  acid (FOLVITE) 400 MCG tablet Take 400 mcg by mouth 2 (two) times daily.     . isosorbide mononitrate (IMDUR) 60 MG 24 hr tablet Take 1 tablet (60 mg total) by mouth daily. 90 tablet 3  . methylcellulose (CITRUCEL) oral powder Takes 2 grams daily    . metoprolol succinate (TOPROL XL) 25 MG 24 hr tablet Take 0.5 tablets (12.5 mg total) by mouth daily. 45 tablet 3  . Misc Natural Products (OSTEO BI-FLEX ADV JOINT SHIELD) TABS Take 1 tablet by mouth 2 (two) times daily.     . nitroGLYCERIN (NITROSTAT) 0.4 MG SL tablet Place 1 tablet (0.4 mg total) under the tongue every 5 (five) minutes as needed. For chest pain. 25 tablet 12  . losartan (COZAAR) 50 MG tablet Take 1 tablet (50 mg total) by mouth daily. 90 tablet 4  . simvastatin (ZOCOR) 40 MG tablet TAKE 1 BY MOUTH EVERY EVENING 90 tablet 1   No facility-administered medications prior to visit.    Allergies  Allergen Reactions  . Lisinopril     REACTION: dizziness  . Methocarbamol     REACTION: pt states "dizzy"  . Pregabalin     REACTION: pt states "dizzy"  . Ramipril     REACTION: hives and dizziness    Review of Systems  Constitutional: Negative for fever and malaise/fatigue.  HENT: Negative for congestion.   Eyes: Negative for discharge.  Respiratory: Negative for shortness of breath.   Cardiovascular: Positive for chest pain. Negative for palpitations and leg swelling.  Gastrointestinal: Negative for nausea and abdominal pain.  Genitourinary: Negative for dysuria.  Musculoskeletal: Negative for falls.  Skin: Negative for rash.  Neurological: Negative for loss of consciousness and headaches.  Endo/Heme/Allergies: Negative for environmental allergies.  Psychiatric/Behavioral: Negative for depression. The patient is not nervous/anxious.        Objective:    Physical Exam  Constitutional: He is oriented to  person, place, and time. He appears well-developed and well-nourished. No distress.  HENT:  Head: Normocephalic and atraumatic.  Nose: Nose normal.  Eyes: Right eye exhibits no discharge. Left eye exhibits no discharge.  Neck: Normal range of motion. Neck supple.  Cardiovascular: Normal rate and regular rhythm.   No murmur heard. Pulmonary/Chest: Effort normal and breath sounds normal.  Abdominal: Soft. Bowel sounds are normal. There is no tenderness.  Musculoskeletal: He exhibits no edema.  Neurological: He is alert and oriented to person, place, and time.  Skin: Skin is warm and dry.  Psychiatric: He has a normal mood and affect.  Nursing note and vitals reviewed.   BP 128/78 mmHg  Pulse 52  Temp(Src) 97.4 F (36.3 C) (Oral)  Ht 5\' 11"  (1.803 m)  Wt 171 lb 8 oz (77.792 kg)  BMI 23.93 kg/m2  SpO2 97% Wt Readings from Last 3 Encounters:  11/16/15 171 lb 8 oz (77.792 kg)  11/04/15 171 lb (77.565 kg)  06/16/15 175 lb (79.379 kg)     Lab Results  Component Value Date   WBC 8.8 05/01/2015   HGB 14.0 05/01/2015   HCT 41.1 05/01/2015   PLT 210.0 05/01/2015   GLUCOSE 94 05/01/2015   CHOL 102 05/01/2015   TRIG 76.0 05/01/2015   HDL 39.50 05/01/2015   LDLCALC 47 05/01/2015   ALT 26 05/01/2015   AST 23 05/01/2015   NA 135 05/01/2015   K 4.1 05/01/2015   CL 103 05/01/2015   CREATININE 1.06 05/01/2015   BUN 30* 05/01/2015   CO2 27 05/01/2015  TSH 2.30 05/01/2015   PSA 4.29* 02/08/2011   INR 1.0 09/17/2013    Lab Results  Component Value Date   TSH 2.30 05/01/2015   Lab Results  Component Value Date   WBC 8.8 05/01/2015   HGB 14.0 05/01/2015   HCT 41.1 05/01/2015   MCV 96.9 05/01/2015   PLT 210.0 05/01/2015   Lab Results  Component Value Date   NA 135 05/01/2015   K 4.1 05/01/2015   CO2 27 05/01/2015   GLUCOSE 94 05/01/2015   BUN 30* 05/01/2015   CREATININE 1.06 05/01/2015   BILITOT 1.3* 05/01/2015   ALKPHOS 68 05/01/2015   AST 23 05/01/2015   ALT 26  05/01/2015   PROT 6.6 05/01/2015   ALBUMIN 3.8 05/01/2015   CALCIUM 9.1 05/01/2015   GFR 69.91 05/01/2015   Lab Results  Component Value Date   CHOL 102 05/01/2015   Lab Results  Component Value Date   HDL 39.50 05/01/2015   Lab Results  Component Value Date   LDLCALC 47 05/01/2015   Lab Results  Component Value Date   TRIG 76.0 05/01/2015   Lab Results  Component Value Date   CHOLHDL 3 05/01/2015   No results found for: HGBA1C     Assessment & Plan:   Problem List Items Addressed This Visit    Atherosclerosis of coronary artery bypass graft with unstable angina pectoris ([place]) - Primary   Relevant Medications   simvastatin (ZOCOR) 40 MG tablet   losartan (COZAAR) 50 MG tablet   Other Relevant Orders   TSH   CBC   Comprehensive metabolic panel   Lipid panel   CAD (coronary artery disease)    Was having chest tightness while walking to mail box and has stress test ordered for 2/17, saw cardiology and they increased his isosorbide mononitrate to 60 mg and his chest tightness has resolved.      Relevant Medications   simvastatin (ZOCOR) 40 MG tablet   losartan (COZAAR) 50 MG tablet   Essential hypertension    Well controlled, no changes to meds. Encouraged heart healthy diet such as the DASH diet and exercise as tolerated. Has been noting bp systolic above [id number], highest possibly 148.       Relevant Medications   simvastatin (ZOCOR) 40 MG tablet   losartan (COZAAR) 50 MG tablet   Other Relevant Orders   TSH   CBC   Comprehensive metabolic panel   Lipid panel   GERD    Avoid offending foods, start probiotics. Do not eat large meals in late evening and consider raising head of bed.       HYPERCHOLESTEROLEMIA    Tolerating statin, encouraged heart healthy diet, avoid trans fats, minimize simple carbs and saturated fats. Increase exercise as tolerated      Relevant Medications   simvastatin (ZOCOR) 40 MG tablet   losartan (COZAAR) 50 MG tablet   Other  Relevant Orders   TSH   CBC   Comprehensive metabolic panel   Lipid panel    Other Visit Diagnoses    Encounter for immunization           I am having Mr. Davia maintain his folic acid, OSTEO BI-FLEX ADV JOINT SHIELD, Cholecalciferol (VITAMIN D-3 PO), aspirin EC, finasteride, methylcellulose, nitroGLYCERIN, B-complex with vitamin C, clopidogrel, allopurinol, isosorbide mononitrate, metoprolol succinate, simvastatin, and losartan.  Meds ordered this encounter  Medications  . simvastatin (ZOCOR) 40 MG tablet    Sig: TAKE 1 BY MOUTH EVERY EVENING  Dispense:  90 tablet    Refill:  3  . losartan (COZAAR) 50 MG tablet    Sig: Take 1 tablet (50 mg total) by mouth daily.    Dispense:  90 tablet    Refill:  3     Penni Homans, MD

## 2015-11-16 NOTE — Assessment & Plan Note (Signed)
Tolerating statin, encouraged heart healthy diet, avoid trans fats, minimize simple carbs and saturated fats. Increase exercise as tolerated 

## 2015-11-16 NOTE — Assessment & Plan Note (Signed)
Well controlled, no changes to meds. Encouraged heart healthy diet such as the DASH diet and exercise as tolerated. Has been noting bp systolic above [id number], highest possibly 148.

## 2015-11-16 NOTE — Assessment & Plan Note (Signed)
Avoid offending foods, start probiotics. Do not eat large meals in late evening and consider raising head of bed.  

## 2015-11-16 NOTE — Patient Instructions (Signed)
Hypertension Hypertension, commonly called high blood pressure, is when the force of blood pumping through your arteries is too strong. Your arteries are the blood vessels that carry blood from your heart throughout your body. A blood pressure reading consists of a higher number over a lower number, such as 110/72. The higher number (systolic) is the pressure inside your arteries when your heart pumps. The lower number (diastolic) is the pressure inside your arteries when your heart relaxes. Ideally you want your blood pressure below 120/80. Hypertension forces your heart to work harder to pump blood. Your arteries may become narrow or stiff. Having untreated or uncontrolled hypertension can cause heart attack, stroke, kidney disease, and other problems. RISK FACTORS Some risk factors for high blood pressure are controllable. Others are not.  Risk factors you cannot control include:   Race. You may be at higher risk if you are African American.  Age. Risk increases with age.  Gender. Men are at higher risk than women before age 45 years. After age 65, women are at higher risk than men. Risk factors you can control include:  Not getting enough exercise or physical activity.  Being overweight.  Getting too much fat, sugar, calories, or salt in your diet.  Drinking too much alcohol. SIGNS AND SYMPTOMS Hypertension does not usually cause signs or symptoms. Extremely high blood pressure (hypertensive crisis) may cause headache, anxiety, shortness of breath, and nosebleed. DIAGNOSIS To check if you have hypertension, your health care provider will measure your blood pressure while you are seated, with your arm held at the level of your heart. It should be measured at least twice using the same arm. Certain conditions can cause a difference in blood pressure between your right and left arms. A blood pressure reading that is higher than normal on one occasion does not mean that you need treatment. If  it is not clear whether you have high blood pressure, you may be asked to return on a different day to have your blood pressure checked again. Or, you may be asked to monitor your blood pressure at home for 1 or more weeks. TREATMENT Treating high blood pressure includes making lifestyle changes and possibly taking medicine. Living a healthy lifestyle can help lower high blood pressure. You may need to change some of your habits. Lifestyle changes may include:  Following the DASH diet. This diet is high in fruits, vegetables, and whole grains. It is low in salt, red meat, and added sugars.  Keep your sodium intake below 2,300 mg per day.  Getting at least 30-45 minutes of aerobic exercise at least 4 times per week.  Losing weight if necessary.  Not smoking.  Limiting alcoholic beverages.  Learning ways to reduce stress. Your health care provider may prescribe medicine if lifestyle changes are not enough to get your blood pressure under control, and if one of the following is true:  You are 18-59 years of age and your systolic blood pressure is above 140.  You are 60 years of age or older, and your systolic blood pressure is above 150.  Your diastolic blood pressure is above 90.  You have diabetes, and your systolic blood pressure is over 140 or your diastolic blood pressure is over 90.  You have kidney disease and your blood pressure is above 140/90.  You have heart disease and your blood pressure is above 140/90. Your personal target blood pressure may vary depending on your medical conditions, your age, and other factors. HOME CARE INSTRUCTIONS    Have your blood pressure rechecked as directed by your health care provider.   Take medicines only as directed by your health care provider. Follow the directions carefully. Blood pressure medicines must be taken as prescribed. The medicine does not work as well when you skip doses. Skipping doses also puts you at risk for  problems.  Do not smoke.   Monitor your blood pressure at home as directed by your health care provider. SEEK MEDICAL CARE IF:   You think you are having a reaction to medicines taken.  You have recurrent headaches or feel dizzy.  You have swelling in your ankles.  You have trouble with your vision. SEEK IMMEDIATE MEDICAL CARE IF:  You develop a severe headache or confusion.  You have unusual weakness, numbness, or feel faint.  You have severe chest or abdominal pain.  You vomit repeatedly.  You have trouble breathing. MAKE SURE YOU:   Understand these instructions.  Will watch your condition.  Will get help right away if you are not doing well or get worse.   This information is not intended to replace advice given to you by your health care provider. Make sure you discuss any questions you have with your health care provider.   Document Released: 11/14/2005 Document Revised: 03/31/2015 Document Reviewed: 09/06/2013 Elsevier Interactive Patient Education 2016 Elsevier Inc.  

## 2015-11-16 NOTE — Assessment & Plan Note (Signed)
Was having chest tightness while walking to mail box and has stress test ordered for 2/17, saw cardiology and they increased his isosorbide mononitrate to 60 mg and his chest tightness has resolved.

## 2015-11-16 NOTE — Progress Notes (Signed)
Pre visit review using our clinic review tool, if applicable. No additional management support is needed unless otherwise documented below in the visit note. 

## 2015-11-25 ENCOUNTER — Encounter: Payer: Self-pay | Admitting: *Deleted

## 2015-11-27 ENCOUNTER — Other Ambulatory Visit: Payer: Self-pay | Admitting: *Deleted

## 2015-11-27 MED ORDER — NITROGLYCERIN 0.4 MG SL SUBL: 0.4000 mg | SUBLINGUAL_TABLET | SUBLINGUAL | Status: DC | PRN

## 2015-11-30 ENCOUNTER — Other Ambulatory Visit: Payer: Self-pay | Admitting: Cardiology

## 2015-12-01 ENCOUNTER — Other Ambulatory Visit: Payer: Self-pay

## 2015-12-01 MED ORDER — NITROGLYCERIN 0.4 MG SL SUBL: 0.4000 mg | SUBLINGUAL_TABLET | SUBLINGUAL | Status: DC | PRN

## 2015-12-01 NOTE — Telephone Encounter (Signed)
Rx(s) sent to pharmacy electronically.  

## 2015-12-01 NOTE — Telephone Encounter (Signed)
Rx request sent to pharmacy.  

## 2015-12-16 ENCOUNTER — Telehealth: Payer: Self-pay | Admitting: Family Medicine

## 2015-12-16 NOTE — Telephone Encounter (Signed)
Pt called to get shingles shot. Added to nurse schedule for 12/17/15 10:00am

## 2015-12-16 NOTE — Telephone Encounter (Signed)
Noted.  Message routed to Idelle Crouch, Therapist, sports.

## 2015-12-17 ENCOUNTER — Ambulatory Visit (INDEPENDENT_AMBULATORY_CARE_PROVIDER_SITE_OTHER): Payer: Medicare Other | Admitting: *Deleted

## 2015-12-17 DIAGNOSIS — Z23 Encounter for immunization: Secondary | ICD-10-CM | POA: Diagnosis not present

## 2015-12-17 NOTE — Progress Notes (Signed)
Pre visit review using our clinic review tool, if applicable. No additional management support is needed unless otherwise documented below in the visit note.  Pt presents for Zostavax vaccination. Injection given in L arm. Pt tolerated well, no S/S of a reaction upon leaving the clinic.

## 2015-12-18 ENCOUNTER — Other Ambulatory Visit: Payer: Self-pay | Admitting: Cardiology

## 2015-12-18 ENCOUNTER — Other Ambulatory Visit: Payer: Self-pay | Admitting: Family Medicine

## 2015-12-25 ENCOUNTER — Telehealth (HOSPITAL_COMMUNITY): Payer: Self-pay

## 2015-12-25 NOTE — Telephone Encounter (Signed)
Encounter complete. 

## 2015-12-28 ENCOUNTER — Telehealth: Payer: Self-pay | Admitting: *Deleted

## 2015-12-28 MED ORDER — ISOSORBIDE MONONITRATE ER 60 MG PO TB24: 60.0000 mg | ORAL_TABLET | Freq: Every day | ORAL | Status: DC

## 2015-12-28 NOTE — Telephone Encounter (Signed)
Pt instructed on increase at last office visit. Refill sent to [facility]. Attempted to call pt and was met w/ disconnected dial. 2nd call went unanswered.  If pt calls back confirm whether he needs rx sent to local pharmacy & if so, what pharmacy.

## 2015-12-28 NOTE — Telephone Encounter (Signed)
Patient called and stated that [facility] needs an rx for the isosorbide. He stated that the dose was changed to 60mg  qd by Dr Stanford Breed some time ago. He would like a call when this has been taken care of and he asked that you please leave a voicemail if no one answers. Please advise. Thanks, MI

## 2015-12-29 ENCOUNTER — Telehealth (HOSPITAL_COMMUNITY): Payer: Self-pay

## 2015-12-29 ENCOUNTER — Other Ambulatory Visit: Payer: Self-pay | Admitting: Family Medicine

## 2015-12-29 MED ORDER — SIMVASTATIN 40 MG PO TABS
ORAL_TABLET | ORAL | Status: DC
Start: 2015-12-29 — End: 2016-01-13

## 2015-12-29 NOTE — Telephone Encounter (Signed)
Refilled simvastatin to mail order per pt request.

## 2015-12-29 NOTE — Telephone Encounter (Signed)
Encounter complete. 

## 2015-12-30 ENCOUNTER — Ambulatory Visit (HOSPITAL_COMMUNITY)
Admission: RE | Admit: 2015-12-30 | Discharge: 2015-12-30 | Disposition: A | Discharge status: Home / Self Care | Payer: Medicare Other | Source: Ambulatory Visit | Attending: Cardiovascular Disease | Admitting: Cardiovascular Disease

## 2015-12-30 DIAGNOSIS — I714 Abdominal aortic aneurysm, without rupture, unspecified: Secondary | ICD-10-CM

## 2015-12-30 DIAGNOSIS — I2510 Atherosclerotic heart disease of native coronary artery without angina pectoris: Secondary | ICD-10-CM | POA: Insufficient documentation

## 2015-12-30 DIAGNOSIS — Z87891 Personal history of nicotine dependence: Secondary | ICD-10-CM | POA: Insufficient documentation

## 2015-12-30 DIAGNOSIS — I10 Essential (primary) hypertension: Secondary | ICD-10-CM | POA: Diagnosis not present

## 2015-12-30 DIAGNOSIS — R0789 Other chest pain: Secondary | ICD-10-CM | POA: Insufficient documentation

## 2015-12-30 DIAGNOSIS — E7800 Pure hypercholesterolemia, unspecified: Secondary | ICD-10-CM | POA: Diagnosis not present

## 2015-12-30 DIAGNOSIS — I708 Atherosclerosis of other arteries: Secondary | ICD-10-CM | POA: Diagnosis not present

## 2015-12-30 DIAGNOSIS — R9439 Abnormal result of other cardiovascular function study: Secondary | ICD-10-CM | POA: Insufficient documentation

## 2015-12-30 DIAGNOSIS — I2583 Coronary atherosclerosis due to lipid rich plaque: Secondary | ICD-10-CM

## 2015-12-30 DIAGNOSIS — I447 Left bundle-branch block, unspecified: Secondary | ICD-10-CM | POA: Insufficient documentation

## 2015-12-30 DIAGNOSIS — Q251 Coarctation of aorta: Secondary | ICD-10-CM | POA: Insufficient documentation

## 2015-12-30 LAB — MYOCARDIAL PERFUSION IMAGING
CHL CUP NUCLEAR SSS: 16
LV sys vol: 90 mL
LVDIAVOL: 139 mL
Peak HR: 63 {beats}/min
Rest HR: 48 {beats}/min
SRS: 16
TID: 1.13

## 2015-12-30 MED ORDER — TECHNETIUM TC 99M SESTAMIBI GENERIC - CARDIOLITE
31.6000 | Freq: Once | INTRAVENOUS | Status: AC | PRN
  Administered 2015-12-30: 32 via INTRAVENOUS

## 2015-12-30 MED ORDER — TECHNETIUM TC 99M SESTAMIBI GENERIC - CARDIOLITE
10.4000 | Freq: Once | INTRAVENOUS | Status: AC | PRN
Start: 2015-12-30 — End: 2015-12-30
  Administered 2015-12-30: 10 via INTRAVENOUS

## 2015-12-30 MED ORDER — REGADENOSON 0.4 MG/5ML IV SOLN
0.4000 mg | Freq: Once | INTRAVENOUS | Status: AC
  Administered 2015-12-30: 0.4 mg via INTRAVENOUS

## 2016-01-01 ENCOUNTER — Encounter: Payer: Self-pay | Admitting: Cardiology

## 2016-01-07 NOTE — Progress Notes (Signed)
HPI: FU CAD; s/p CABG in 1979 and 1993, ischemic CM, systolic CHF, AAA, HTN, HL. Patient underwent cardiac catheterization in May of 2014. The left main, LAD, circumflex and RCA were occluded. The LIMA to the LAD was patent and the distal LAD was occluded after the insertion. Saphenous vein graft to the obtuse marginal was occluded. Saphenous vein graft to the acute marginal and PDA had a high-grade lesion prior to insertion into the PDA of 80-90%. Ejection fraction was 25%. PCI: Promus Premier (3.5x12 mm) DES to the [facility]. Echo (09/05/13): EF 35%, diffuse HK worsened distal septal, mid/distal inferior and apical region, grade 1 diastolic dysfunction, mild LAE. Patient had repeat catheterization in October 2014 because of recurrent chest pain. The stent placed in the saphenous vein graft to the PDA had mild in-stent restenosis. Medical therapy recommended. Abdominal ultrasound 2/17 showed an abdominal aortic aneurysm of 3.5 x 3.5 cm. Nuclear study 2/17 showed EF 35, inferolateral scar, no ischemia. Since he was last seen, He has chest tightness with vigorous activities but not routine activities or at rest. No dyspnea, palpitations or syncope.  Current Outpatient Prescriptions  Medication Sig Dispense Refill  . allopurinol (ZYLOPRIM) 300 MG tablet Take 1 tablet (300 mg total) by mouth daily with breakfast. 90 tablet 1  . aspirin EC 81 MG tablet Take 81 mg by mouth every morning.     . B Complex-C (B-COMPLEX WITH VITAMIN C) tablet Take 1 tablet by mouth daily.    . Cholecalciferol (VITAMIN D-3 PO) Take 1,000 Units by mouth daily with breakfast.     . clopidogrel (PLAVIX) 75 MG tablet TAKE 1 BY MOUTH EVERY MORNING 90 tablet 0  . finasteride (PROSCAR) 5 MG tablet Takes every third day    . folic acid (FOLVITE) A999333 MCG tablet Take 400 mcg by mouth 2 (two) times daily.     . isosorbide mononitrate (IMDUR) 60 MG 24 hr tablet Take 1 tablet (60 mg total) by mouth daily. 90 tablet 3  . losartan (COZAAR)  50 MG tablet Take 1 tablet (50 mg total) by mouth daily. 90 tablet 3  . methylcellulose (CITRUCEL) oral powder Takes 2 grams daily    . metoprolol succinate (TOPROL-XL) 25 MG 24 hr tablet TAKE 1/2 BY MOUTH DAILY 45 tablet 0  . Misc Natural Products (OSTEO BI-FLEX ADV JOINT SHIELD) TABS Take 1 tablet by mouth 2 (two) times daily.     . nitroGLYCERIN (NITROSTAT) 0.4 MG SL tablet Place 1 tablet (0.4 mg total) under the tongue every 5 (five) minutes as needed. For chest pain. 25 tablet 6  . simvastatin (ZOCOR) 40 MG tablet TAKE 1 BY MOUTH EVERY EVENING 90 tablet 3   No current facility-administered medications for this visit.     Past Medical History  Diagnosis Date  . Cerumen impaction     Bilateral  . Hypertension   . Peripheral vascular disease ([place])   . Hypercholesterolemia   . GERD (gastroesophageal reflux disease)   . Diverticulosis of colon   . Amebic dysentery   . Benign prostatic hypertrophy   . Degenerative joint disease   . Gout   . Lumbar back pain   . Anxiety   . Nephrolithiasis   . Heart murmur   . CAD (coronary artery disease)     a. s/p CABG in 1979 and 1993;  b. LHC (5/14):  LM, LAD, CFX and RCA occluded; L-LAD ok, dLAD occluded after insertion of LIMA, S-OM occluded, S-PDA/AM 80-90 =>  PCI with Promus DES; EF 25%  . Chronic systolic CHF (congestive heart failure) ([place])   . Ischemic cardiomyopathy     a. echo (09/05/13): EF 35%, diffuse HK worsened distal septal, mid/distal inferior and apical region, grade 1 diastolic dysfunction, mild LAE.    [name] Abdominal aortic aneurysm ([place])     a. [place] (1/14):  3.3 x 3.4 cm => f/u 11/2013  . Chicken pox as a child  . Measles as a child  . Mumps as a child  . Hearing loss 11/24/2014  . Atherosclerosis of coronary artery bypass graft with unstable angina pectoris ([place]) 04/19/2013  . Cardiomyopathy, ischemic 06/05/2013  . Essential hypertension 11/16/2007    Qualifier: Diagnosis of  By: Julien Girt CMA, Marliss Czar    . HYPERCHOLESTEROLEMIA  11/16/2007    Qualifier: Diagnosis of  By: Julien Girt CMA, Marliss Czar    . PERIPHERAL VASCULAR DISEASE 11/16/2007    Qualifier: Diagnosis of  By: Julien Girt CMA, Marliss Czar    . AMEBIC DYSENTERY 11/19/2007    Qualifier: History of  By: Lenna Gilford MD, [place] 11/19/2007    Qualifier: Diagnosis of  By: Lenna Gilford MD, Deborra Medina   . BACK PAIN, LUMBAR 11/16/2007    Qualifier: Diagnosis of  By: Julien Girt CMA, [place]    . BENIGN PROSTATIC HYPERTROPHY, HX OF 11/16/2007    Qualifier: Diagnosis of  By: Julien Girt CMA, Leigh    . CERUMEN IMPACTION, BILATERAL 02/03/2010    Qualifier: History of  By: Lenna Gilford MD, [place] 07/01/2008    Qualifier: Diagnosis of  By: Lenna Gilford MD, [place] DISEASE 11/16/2007    Qualifier: Diagnosis of  By: Julien Girt CMA, Marliss Czar    . DIVERTICULOSIS OF COLON 07/01/2008    Qualifier: Diagnosis of  By: Lenna Gilford MD, Deborra Medina   . FLANK PAIN, RIGHT 02/03/2010    Qualifier: History of  By: Lenna Gilford MD, Deborra Medina   . GERD 11/16/2007    Qualifier: Diagnosis of  By: Julien Girt CMA, Marliss Czar    . GOUT 11/16/2007    Qualifier: Diagnosis of  By: Julien Girt CMA, Marliss Czar    . Kidney stone 08/17/2011  . Medicare annual wellness visit, subsequent 11/24/2014    Sees Dr Delman Cheadle for dermatology Sees Dr Roni Bread of Urology Sees Dr Stanford Breed of cardiology Sees Dr Virginia Rochester of Opthamology No further colonoscopies warranted       . Pain in joint, lower leg 07/29/2014  . Shingles 07/29/2014  . Sun-damaged skin 05/31/2014  . Loss of hearing     Past Surgical History  Procedure Laterality Date  . Inguinal hernia repair  1994    Right by Dr Harlow Asa  . Inguinal hernia repair  1996    Left by Dr. Harlow Asa  . Decompressive laminectomy  01/2006    L2 - scarum by Dr. Shellia Carwin  . Coronary angioplasty with stent placement  04/18/2013    RCA       . Hemorrhoid surgery      fissure with hemorrhoid corrected at age 51  . Tonsillectomy    . Percutaneous coronary stent intervention (pci-s) N/A 04/18/2013    Procedure: PERCUTANEOUS  CORONARY STENT INTERVENTION (PCI-S);  Surgeon: Sherren Mocha, MD;  Location: [facility] CATH LAB;  Service: Cardiovascular;  Laterality: N/A;  . Left heart catheterization with coronary angiogram N/A 09/23/2013    Procedure: LEFT HEART CATHETERIZATION WITH CORONARY ANGIOGRAM;  Surgeon: Blane Ohara, MD;  Location: [facility] CATH LAB;  Service: Cardiovascular;  Laterality:  N/A;  . Coronary artery bypass graft  1979    x4 SVG-DIAG-LAD, SVG-OM-PDA  . Coronary artery bypass graft  1993    Redo x5 by Dr Harlow Asa; Durward Fortes, SVG-OM, SVG-AM-PL    Social History   Social History  . Marital Status: Married    Spouse Name: [name] x 109 yrs  . Number of Children: N/A  . Years of Education: N/A   Occupational History  . 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 Smoking status: Former Smoker    Quit date: 11/28/1944  . Smokeless tobacco: Never Used  . Alcohol Use: 7.0 oz/week    14 Standard drinks or equivalent per week     Comment: daily rum  or wine  . Drug Use: No  . Sexual Activity: Not Currently     Comment: lives with wife, no dietary restrictions.    Other Topics Concern  . Not on file   Social History Narrative   Married   7 children    Family History  Problem Relation Age of Onset  . Parkinsonism Brother   . Diabetes Maternal Grandmother   . Depression Daughter   . Other Son     4 stents  . Heart disease Son   . Diabetes Son     type 2    ROS: no fevers or chills, productive cough, hemoptysis, dysphasia, odynophagia, melena, hematochezia, dysuria, hematuria, rash, seizure activity, orthopnea, PND, pedal edema, claudication. Remaining systems are negative.  Physical Exam: Well-developed well-nourished in no acute distress.  Skin is warm and dry.  HEENT is normal.  Neck is supple.  Chest is clear to auscultation with normal expansion.  Cardiovascular exam is regular rate and rhythm.  Abdominal exam nontender or distended. No masses  palpated. Extremities show no edema. neuro grossly intact

## 2016-01-13 ENCOUNTER — Ambulatory Visit (INDEPENDENT_AMBULATORY_CARE_PROVIDER_SITE_OTHER): Payer: Medicare Other | Admitting: Cardiology

## 2016-01-13 ENCOUNTER — Encounter: Payer: Self-pay | Admitting: Cardiology

## 2016-01-13 VITALS — BP 130/68 | HR 55 | Ht 71.0 in | Wt 173.0 lb

## 2016-01-13 DIAGNOSIS — I2510 Atherosclerotic heart disease of native coronary artery without angina pectoris: Secondary | ICD-10-CM

## 2016-01-13 DIAGNOSIS — I255 Ischemic cardiomyopathy: Secondary | ICD-10-CM | POA: Diagnosis not present

## 2016-01-13 DIAGNOSIS — I2583 Coronary atherosclerosis due to lipid rich plaque: Secondary | ICD-10-CM

## 2016-01-13 DIAGNOSIS — I714 Abdominal aortic aneurysm, without rupture, unspecified: Secondary | ICD-10-CM

## 2016-01-13 DIAGNOSIS — I25700 Atherosclerosis of coronary artery bypass graft(s), unspecified, with unstable angina pectoris: Secondary | ICD-10-CM

## 2016-01-13 DIAGNOSIS — E7800 Pure hypercholesterolemia, unspecified: Secondary | ICD-10-CM

## 2016-01-13 DIAGNOSIS — I10 Essential (primary) hypertension: Secondary | ICD-10-CM

## 2016-01-13 MED ORDER — LOSARTAN POTASSIUM 50 MG PO TABS: 50.0000 mg | ORAL_TABLET | Freq: Every day | ORAL | Status: DC

## 2016-01-13 NOTE — Patient Instructions (Signed)
.  Medication Instructions:   NO CHANGE  Follow-Up:  Your physician recommends that you schedule a follow-up appointment in: [place]

## 2016-01-13 NOTE — Assessment & Plan Note (Signed)
Continue present blood pressure medication. 

## 2016-01-13 NOTE — Assessment & Plan Note (Signed)
Continue statin. 

## 2016-01-13 NOTE — Assessment & Plan Note (Signed)
Continue ARB and beta blocker. 

## 2016-01-13 NOTE — Assessment & Plan Note (Signed)
Continue aspirin and statin.I have reviewed his nuclear study in depth with him. It shows scar and no ischemia. His symptoms appear to be stable. If he has progressive symptoms we will consider cardiac catheterization. Approximately 25 minutes spent in the office today discussing results of stress test with patient.

## 2016-01-13 NOTE — Assessment & Plan Note (Signed)
Follow-up abdominal ultrasound February 2017.

## 2016-01-20 ENCOUNTER — Telehealth: Payer: Self-pay | Admitting: Family Medicine

## 2016-01-20 NOTE — Telephone Encounter (Signed)
VM from pt 2/22 10:34am requesting call to conf if he or Daniel Reeves have appt 2/24 or 3/17, LM on machine to notify neither has appt on those dates

## 2016-02-12 ENCOUNTER — Telehealth: Payer: Self-pay | Admitting: Family Medicine

## 2016-02-12 MED ORDER — ALLOPURINOL 300 MG PO TABS: 300.0000 mg | ORAL_TABLET | Freq: Every day | ORAL | Status: DC

## 2016-02-12 NOTE — Telephone Encounter (Signed)
Relation to [id number] Call back [place]: [facility] [address] [place], [place] [address] [phone]    Reason for call:  Patient requesting a refill allopurinol (ZYLOPRIM) 300 MG tablet

## 2016-02-12 NOTE — Telephone Encounter (Signed)
Refill done.  

## 2016-03-08 ENCOUNTER — Telehealth: Payer: Self-pay | Admitting: Family Medicine

## 2016-03-08 NOTE — Telephone Encounter (Signed)
allopurinol (ZYLOPRIM) 300 MG tablet 90 tablet 1 02/12/2016       Sig - Route: Take 1 tablet (300 mg total) by mouth daily with breakfast. - Oral     E-Prescribing Status: Receipt confirmed by pharmacy (02/12/2016 1:40 PM EDT)

## 2016-03-08 NOTE — Telephone Encounter (Signed)
Caller name: Self  Can be reached: [phone]  Pharmacy:  [id number] - [place], [place] - [address] AT [address] [phone] (Phone) [phone] (Fax)       Reason for call: Request refills on allopurinol (ZYLOPRIM) 300 MG tablet [id number]

## 2016-04-04 DIAGNOSIS — M5431 Sciatica, right side: Secondary | ICD-10-CM | POA: Diagnosis not present

## 2016-04-04 DIAGNOSIS — M9902 Segmental and somatic dysfunction of thoracic region: Secondary | ICD-10-CM | POA: Diagnosis not present

## 2016-04-04 DIAGNOSIS — M9901 Segmental and somatic dysfunction of cervical region: Secondary | ICD-10-CM | POA: Diagnosis not present

## 2016-04-04 DIAGNOSIS — M9904 Segmental and somatic dysfunction of sacral region: Secondary | ICD-10-CM | POA: Diagnosis not present

## 2016-04-08 DIAGNOSIS — M9901 Segmental and somatic dysfunction of cervical region: Secondary | ICD-10-CM | POA: Diagnosis not present

## 2016-04-08 DIAGNOSIS — M9904 Segmental and somatic dysfunction of sacral region: Secondary | ICD-10-CM | POA: Diagnosis not present

## 2016-04-08 DIAGNOSIS — M9902 Segmental and somatic dysfunction of thoracic region: Secondary | ICD-10-CM | POA: Diagnosis not present

## 2016-04-08 DIAGNOSIS — M5431 Sciatica, right side: Secondary | ICD-10-CM | POA: Diagnosis not present

## 2016-04-11 DIAGNOSIS — M9902 Segmental and somatic dysfunction of thoracic region: Secondary | ICD-10-CM | POA: Diagnosis not present

## 2016-04-11 DIAGNOSIS — M5431 Sciatica, right side: Secondary | ICD-10-CM | POA: Diagnosis not present

## 2016-04-11 DIAGNOSIS — M9904 Segmental and somatic dysfunction of sacral region: Secondary | ICD-10-CM | POA: Diagnosis not present

## 2016-04-11 DIAGNOSIS — M9901 Segmental and somatic dysfunction of cervical region: Secondary | ICD-10-CM | POA: Diagnosis not present

## 2016-04-15 DIAGNOSIS — M9904 Segmental and somatic dysfunction of sacral region: Secondary | ICD-10-CM | POA: Diagnosis not present

## 2016-04-15 DIAGNOSIS — M5431 Sciatica, right side: Secondary | ICD-10-CM | POA: Diagnosis not present

## 2016-04-15 DIAGNOSIS — M9901 Segmental and somatic dysfunction of cervical region: Secondary | ICD-10-CM | POA: Diagnosis not present

## 2016-04-15 DIAGNOSIS — M9902 Segmental and somatic dysfunction of thoracic region: Secondary | ICD-10-CM | POA: Diagnosis not present

## 2016-04-18 DIAGNOSIS — M5431 Sciatica, right side: Secondary | ICD-10-CM | POA: Diagnosis not present

## 2016-04-18 DIAGNOSIS — M9901 Segmental and somatic dysfunction of cervical region: Secondary | ICD-10-CM | POA: Diagnosis not present

## 2016-04-18 DIAGNOSIS — M9904 Segmental and somatic dysfunction of sacral region: Secondary | ICD-10-CM | POA: Diagnosis not present

## 2016-04-18 DIAGNOSIS — M9902 Segmental and somatic dysfunction of thoracic region: Secondary | ICD-10-CM | POA: Diagnosis not present

## 2016-04-20 ENCOUNTER — Encounter: Payer: Self-pay | Admitting: Cardiology

## 2016-04-20 ENCOUNTER — Ambulatory Visit (INDEPENDENT_AMBULATORY_CARE_PROVIDER_SITE_OTHER): Payer: Medicare Other | Admitting: Cardiology

## 2016-04-20 VITALS — BP 123/64 | HR 56 | Ht 71.0 in | Wt 172.4 lb

## 2016-04-20 DIAGNOSIS — I10 Essential (primary) hypertension: Secondary | ICD-10-CM | POA: Diagnosis not present

## 2016-04-20 DIAGNOSIS — I255 Ischemic cardiomyopathy: Secondary | ICD-10-CM | POA: Diagnosis not present

## 2016-04-20 DIAGNOSIS — E7800 Pure hypercholesterolemia, unspecified: Secondary | ICD-10-CM

## 2016-04-20 DIAGNOSIS — I25810 Atherosclerosis of coronary artery bypass graft(s) without angina pectoris: Secondary | ICD-10-CM

## 2016-04-20 NOTE — Assessment & Plan Note (Signed)
Continue aspirin and statin. Continue nitrates.He has stable exertional angina. We will consider repeating cardiac catheterization if his symptoms worsen.

## 2016-04-20 NOTE — Progress Notes (Signed)
HPI: FU CAD; s/p CABG in 1979 and 1993, ischemic CM, systolic CHF, AAA, HTN, HL. Patient underwent cardiac catheterization in May of 2014. The left main, LAD, circumflex and RCA were occluded. The LIMA to the LAD was patent and the distal LAD was occluded after the insertion. Saphenous vein graft to the obtuse marginal was occluded. Saphenous vein graft to the acute marginal and PDA had a high-grade lesion prior to insertion into the PDA of 80-90%. Ejection fraction was 25%. PCI: Promus Premier (3.5x12 mm) DES to the [facility]. Echo (09/05/13): EF 35%, diffuse HK worsened distal septal, mid/distal inferior and apical region, grade 1 diastolic dysfunction, mild LAE. Patient had repeat catheterization in October 2014 because of recurrent chest pain. The stent placed in the saphenous vein graft to the PDA had mild in-stent restenosis. Medical therapy recommended. Abdominal ultrasound 2/17 showed an abdominal aortic aneurysm of 3.5 x 3.5 cm. Nuclear study 2/17 showed EF 35, inferolateral scar, no ischemia. Since he was last seen, Patient has some chest tightness with walking up his driveway to get his newspaper but otherwise no chest pain. He has no symptoms when mowing his heart. His symptoms are unchanged. He denies dyspnea or syncope.  Current Outpatient Prescriptions  Medication Sig Dispense Refill  . allopurinol (ZYLOPRIM) 300 MG tablet Take 1 tablet (300 mg total) by mouth daily with breakfast. 90 tablet 1  . aspirin EC 81 MG tablet Take 81 mg by mouth every morning.     . B Complex-C (B-COMPLEX WITH VITAMIN C) tablet Take 1 tablet by mouth daily.    . Cholecalciferol (VITAMIN D-3 PO) Take 5,000 Units by mouth daily with breakfast.     . clopidogrel (PLAVIX) 75 MG tablet TAKE 1 BY MOUTH EVERY MORNING 90 tablet 0  . finasteride (PROSCAR) 5 MG tablet Takes every third day    . folic acid (FOLVITE) A999333 MCG tablet Take 400 mcg by mouth 2 (two) times daily.     . isosorbide mononitrate (IMDUR) 60 MG 24  hr tablet Take 1 tablet (60 mg total) by mouth daily. 90 tablet 3  . losartan (COZAAR) 50 MG tablet Take 1 tablet (50 mg total) by mouth daily. 90 tablet 3  . metoprolol succinate (TOPROL-XL) 25 MG 24 hr tablet TAKE 1/2 BY MOUTH DAILY 45 tablet 0  . Misc Natural Products (OSTEO BI-FLEX ADV JOINT SHIELD) TABS Take 1 tablet by mouth 2 (two) times daily.     . nitroGLYCERIN (NITROSTAT) 0.4 MG SL tablet Place 1 tablet (0.4 mg total) under the tongue every 5 (five) minutes as needed. For chest pain. 25 tablet 6  . simvastatin (ZOCOR) 40 MG tablet TAKE 1 BY MOUTH EVERY EVENING 90 tablet 3   No current facility-administered medications for this visit.     Past Medical History  Diagnosis Date  . Cerumen impaction     Bilateral  . Hypertension   . Peripheral vascular disease ([place])   . Hypercholesterolemia   . GERD (gastroesophageal reflux disease)   . Diverticulosis of colon   . Amebic dysentery   . Benign prostatic hypertrophy   . Degenerative joint disease   . Gout   . Lumbar back pain   . Anxiety   . Nephrolithiasis   . Heart murmur   . CAD (coronary artery disease)     a. s/p CABG in 1979 and 1993;  b. LHC (5/14):  LM, LAD, CFX and RCA occluded; L-LAD ok, dLAD occluded after insertion of LIMA,  S-OM occluded, S-PDA/AM 80-90 => PCI with Promus DES; EF 25%  . Chronic systolic CHF (congestive heart failure) ([place])   . Ischemic cardiomyopathy     a. echo (09/05/13): EF 35%, diffuse HK worsened distal septal, mid/distal inferior and apical region, grade 1 diastolic dysfunction, mild LAE.    [name] Abdominal aortic aneurysm ([place])     a. [place] (1/14):  3.3 x 3.4 cm => f/u 11/2013  . Chicken pox as a child  . Measles as a child  . Mumps as a child  . Hearing loss 11/24/2014  . Atherosclerosis of coronary artery bypass graft with unstable angina pectoris ([place]) 04/19/2013  . Cardiomyopathy, ischemic 06/05/2013  . Essential hypertension 11/16/2007    Qualifier: Diagnosis of  By: Julien Girt CMA, Marliss Czar    .  HYPERCHOLESTEROLEMIA 11/16/2007    Qualifier: Diagnosis of  By: Julien Girt CMA, Marliss Czar    . PERIPHERAL VASCULAR DISEASE 11/16/2007    Qualifier: Diagnosis of  By: Julien Girt CMA, Marliss Czar    . AMEBIC DYSENTERY 11/19/2007    Qualifier: History of  By: Lenna Gilford MD, [place] 11/19/2007    Qualifier: Diagnosis of  By: Lenna Gilford MD, Deborra Medina   . BACK PAIN, LUMBAR 11/16/2007    Qualifier: Diagnosis of  By: Julien Girt CMA, [place]    . BENIGN PROSTATIC HYPERTROPHY, HX OF 11/16/2007    Qualifier: Diagnosis of  By: Julien Girt CMA, [place]    . CERUMEN IMPACTION, BILATERAL 02/03/2010    Qualifier: History of  By: Lenna Gilford MD, [place] 07/01/2008    Qualifier: Diagnosis of  By: Lenna Gilford MD, [place] DISEASE 11/16/2007    Qualifier: Diagnosis of  By: Julien Girt CMA, Marliss Czar    . DIVERTICULOSIS OF COLON 07/01/2008    Qualifier: Diagnosis of  By: Lenna Gilford MD, Deborra Medina   . FLANK PAIN, RIGHT 02/03/2010    Qualifier: History of  By: Lenna Gilford MD, Deborra Medina   . GERD 11/16/2007    Qualifier: Diagnosis of  By: Julien Girt CMA, Marliss Czar    . GOUT 11/16/2007    Qualifier: Diagnosis of  By: Julien Girt CMA, Marliss Czar    . Kidney stone 08/17/2011  . Medicare annual wellness visit, subsequent 11/24/2014    Sees Dr Delman Cheadle for dermatology Sees Dr Roni Bread of Urology Sees Dr Stanford Breed of cardiology Sees Dr Virginia Rochester of Opthamology No further colonoscopies warranted       . Pain in joint, lower leg 07/29/2014  . Shingles 07/29/2014  . Sun-damaged skin 05/31/2014  . Loss of hearing     Past Surgical History  Procedure Laterality Date  . Inguinal hernia repair  1994    Right by Dr Harlow Asa  . Inguinal hernia repair  1996    Left by Dr. Harlow Asa  . Decompressive laminectomy  01/2006    L2 - scarum by Dr. Shellia Carwin  . Coronary angioplasty with stent placement  04/18/2013    RCA       . Hemorrhoid surgery      fissure with hemorrhoid corrected at age 14  . Tonsillectomy    . Percutaneous coronary stent intervention (pci-s) N/A 04/18/2013    Procedure:  PERCUTANEOUS CORONARY STENT INTERVENTION (PCI-S);  Surgeon: Sherren Mocha, MD;  Location: [facility] CATH LAB;  Service: Cardiovascular;  Laterality: N/A;  . Left heart catheterization with coronary angiogram N/A 09/23/2013    Procedure: LEFT HEART CATHETERIZATION WITH CORONARY ANGIOGRAM;  Surgeon: Blane Ohara, MD;  Location: [facility] CATH LAB;  Service: Cardiovascular;  Laterality: N/A;  . Coronary artery bypass graft  1979    x4 SVG-DIAG-LAD, SVG-OM-PDA  . Coronary artery bypass graft  1993    Redo x5 by Dr Harlow Asa; [facility], SVG-OM, SVG-AM-PL    Social History   Social History  . Marital Status: Married    Spouse Name: [name] x 23 yrs  . Number of Children: N/A  . Years of Education: N/A   Occupational History  . 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 Smoking status: Former Smoker    Quit date: 11/28/1944  . Smokeless tobacco: Never Used  . Alcohol Use: 7.0 oz/week    14 Standard drinks or equivalent per week     Comment: daily rum  or wine  . Drug Use: No  . Sexual Activity: Not Currently     Comment: lives with wife, no dietary restrictions.    Other Topics Concern  . Not on file   Social History Narrative   Married   7 children    Family History  Problem Relation Age of Onset  . Parkinsonism Brother   . Diabetes Maternal Grandmother   . Depression Daughter   . Other Son     4 stents  . Heart disease Son   . Diabetes Son     type 2    ROS: no fevers or chills, productive cough, hemoptysis, dysphasia, odynophagia, melena, hematochezia, dysuria, hematuria, rash, seizure activity, orthopnea, PND, pedal edema, claudication. Remaining systems are negative.  Physical Exam: Well-developed well-nourished in no acute distress.  Skin is warm and dry.  HEENT is normal.  Neck is supple.  Chest is clear to auscultation with normal expansion.  Cardiovascular exam is regular rate and rhythm.  Abdominal exam nontender or distended. No  masses palpated. Extremities show no edema. neuro grossly intact  ECG Sinus rhythm at a rate of 56. Occasional PVC. Left bundle branch block.

## 2016-04-20 NOTE — Patient Instructions (Signed)
Your physician wants you to follow-up in: 6 MONTHS WITH DR CRENSHAW You will receive a reminder letter in the mail two months in advance. If you don't receive a letter, please call our office to schedule the follow-up appointment.   If you need a refill on your cardiac medications before your next appointment, please call your pharmacy.  

## 2016-04-20 NOTE — Assessment & Plan Note (Signed)
Continue statin. 

## 2016-04-20 NOTE — Assessment & Plan Note (Signed)
Continue ARB and beta blocker. 

## 2016-04-20 NOTE — Assessment & Plan Note (Signed)
Blood pressure controlled. Continue present medications. 

## 2016-04-20 NOTE — Assessment & Plan Note (Signed)
Follow-up abdominal ultrasound February 2018.

## 2016-04-22 DIAGNOSIS — M9901 Segmental and somatic dysfunction of cervical region: Secondary | ICD-10-CM | POA: Diagnosis not present

## 2016-04-22 DIAGNOSIS — M9902 Segmental and somatic dysfunction of thoracic region: Secondary | ICD-10-CM | POA: Diagnosis not present

## 2016-04-22 DIAGNOSIS — M9904 Segmental and somatic dysfunction of sacral region: Secondary | ICD-10-CM | POA: Diagnosis not present

## 2016-04-22 DIAGNOSIS — M5431 Sciatica, right side: Secondary | ICD-10-CM | POA: Diagnosis not present

## 2016-04-27 DIAGNOSIS — M5431 Sciatica, right side: Secondary | ICD-10-CM | POA: Diagnosis not present

## 2016-04-27 DIAGNOSIS — M9901 Segmental and somatic dysfunction of cervical region: Secondary | ICD-10-CM | POA: Diagnosis not present

## 2016-04-27 DIAGNOSIS — M9902 Segmental and somatic dysfunction of thoracic region: Secondary | ICD-10-CM | POA: Diagnosis not present

## 2016-04-27 DIAGNOSIS — M9904 Segmental and somatic dysfunction of sacral region: Secondary | ICD-10-CM | POA: Diagnosis not present

## 2016-04-29 DIAGNOSIS — M9901 Segmental and somatic dysfunction of cervical region: Secondary | ICD-10-CM | POA: Diagnosis not present

## 2016-04-29 DIAGNOSIS — M9902 Segmental and somatic dysfunction of thoracic region: Secondary | ICD-10-CM | POA: Diagnosis not present

## 2016-04-29 DIAGNOSIS — M9904 Segmental and somatic dysfunction of sacral region: Secondary | ICD-10-CM | POA: Diagnosis not present

## 2016-04-29 DIAGNOSIS — M5431 Sciatica, right side: Secondary | ICD-10-CM | POA: Diagnosis not present

## 2016-05-02 DIAGNOSIS — M9904 Segmental and somatic dysfunction of sacral region: Secondary | ICD-10-CM | POA: Diagnosis not present

## 2016-05-02 DIAGNOSIS — M5431 Sciatica, right side: Secondary | ICD-10-CM | POA: Diagnosis not present

## 2016-05-02 DIAGNOSIS — M9901 Segmental and somatic dysfunction of cervical region: Secondary | ICD-10-CM | POA: Diagnosis not present

## 2016-05-02 DIAGNOSIS — M9902 Segmental and somatic dysfunction of thoracic region: Secondary | ICD-10-CM | POA: Diagnosis not present

## 2016-05-04 DIAGNOSIS — M5431 Sciatica, right side: Secondary | ICD-10-CM | POA: Diagnosis not present

## 2016-05-04 DIAGNOSIS — M9904 Segmental and somatic dysfunction of sacral region: Secondary | ICD-10-CM | POA: Diagnosis not present

## 2016-05-04 DIAGNOSIS — M9902 Segmental and somatic dysfunction of thoracic region: Secondary | ICD-10-CM | POA: Diagnosis not present

## 2016-05-04 DIAGNOSIS — M9901 Segmental and somatic dysfunction of cervical region: Secondary | ICD-10-CM | POA: Diagnosis not present

## 2016-05-09 DIAGNOSIS — M9902 Segmental and somatic dysfunction of thoracic region: Secondary | ICD-10-CM | POA: Diagnosis not present

## 2016-05-09 DIAGNOSIS — M5431 Sciatica, right side: Secondary | ICD-10-CM | POA: Diagnosis not present

## 2016-05-09 DIAGNOSIS — M9901 Segmental and somatic dysfunction of cervical region: Secondary | ICD-10-CM | POA: Diagnosis not present

## 2016-05-09 DIAGNOSIS — M9904 Segmental and somatic dysfunction of sacral region: Secondary | ICD-10-CM | POA: Diagnosis not present

## 2016-05-13 DIAGNOSIS — M9904 Segmental and somatic dysfunction of sacral region: Secondary | ICD-10-CM | POA: Diagnosis not present

## 2016-05-13 DIAGNOSIS — M9902 Segmental and somatic dysfunction of thoracic region: Secondary | ICD-10-CM | POA: Diagnosis not present

## 2016-05-13 DIAGNOSIS — M9901 Segmental and somatic dysfunction of cervical region: Secondary | ICD-10-CM | POA: Diagnosis not present

## 2016-05-13 DIAGNOSIS — M5431 Sciatica, right side: Secondary | ICD-10-CM | POA: Diagnosis not present

## 2016-05-14 ENCOUNTER — Encounter: Payer: Self-pay | Admitting: Family Medicine

## 2016-05-16 ENCOUNTER — Other Ambulatory Visit: Payer: Self-pay | Admitting: Family Medicine

## 2016-05-16 DIAGNOSIS — I10 Essential (primary) hypertension: Secondary | ICD-10-CM

## 2016-05-16 DIAGNOSIS — M5431 Sciatica, right side: Secondary | ICD-10-CM | POA: Diagnosis not present

## 2016-05-16 DIAGNOSIS — E7800 Pure hypercholesterolemia, unspecified: Secondary | ICD-10-CM

## 2016-05-16 DIAGNOSIS — M9901 Segmental and somatic dysfunction of cervical region: Secondary | ICD-10-CM | POA: Diagnosis not present

## 2016-05-16 DIAGNOSIS — M9904 Segmental and somatic dysfunction of sacral region: Secondary | ICD-10-CM | POA: Diagnosis not present

## 2016-05-16 DIAGNOSIS — M9902 Segmental and somatic dysfunction of thoracic region: Secondary | ICD-10-CM | POA: Diagnosis not present

## 2016-05-16 DIAGNOSIS — I25700 Atherosclerosis of coronary artery bypass graft(s), unspecified, with unstable angina pectoris: Secondary | ICD-10-CM

## 2016-05-16 MED ORDER — SIMVASTATIN 40 MG PO TABS: ORAL_TABLET | ORAL | Status: DC

## 2016-05-23 DIAGNOSIS — M9901 Segmental and somatic dysfunction of cervical region: Secondary | ICD-10-CM | POA: Diagnosis not present

## 2016-05-23 DIAGNOSIS — M5431 Sciatica, right side: Secondary | ICD-10-CM | POA: Diagnosis not present

## 2016-05-23 DIAGNOSIS — M9904 Segmental and somatic dysfunction of sacral region: Secondary | ICD-10-CM | POA: Diagnosis not present

## 2016-05-23 DIAGNOSIS — M9902 Segmental and somatic dysfunction of thoracic region: Secondary | ICD-10-CM | POA: Diagnosis not present

## 2016-05-27 ENCOUNTER — Encounter: Payer: Self-pay | Admitting: Cardiology

## 2016-05-30 ENCOUNTER — Other Ambulatory Visit: Payer: Self-pay | Admitting: *Deleted

## 2016-05-30 DIAGNOSIS — M9904 Segmental and somatic dysfunction of sacral region: Secondary | ICD-10-CM | POA: Diagnosis not present

## 2016-05-30 DIAGNOSIS — M9902 Segmental and somatic dysfunction of thoracic region: Secondary | ICD-10-CM | POA: Diagnosis not present

## 2016-05-30 DIAGNOSIS — M9901 Segmental and somatic dysfunction of cervical region: Secondary | ICD-10-CM | POA: Diagnosis not present

## 2016-05-30 DIAGNOSIS — M5431 Sciatica, right side: Secondary | ICD-10-CM | POA: Diagnosis not present

## 2016-05-30 MED ORDER — METOPROLOL SUCCINATE ER 25 MG PO TB24: 12.5000 mg | ORAL_TABLET | Freq: Every day | ORAL | Status: DC

## 2016-05-30 NOTE — Telephone Encounter (Signed)
Med refilled.

## 2016-06-06 DIAGNOSIS — M9902 Segmental and somatic dysfunction of thoracic region: Secondary | ICD-10-CM | POA: Diagnosis not present

## 2016-06-06 DIAGNOSIS — M9904 Segmental and somatic dysfunction of sacral region: Secondary | ICD-10-CM | POA: Diagnosis not present

## 2016-06-06 DIAGNOSIS — M9901 Segmental and somatic dysfunction of cervical region: Secondary | ICD-10-CM | POA: Diagnosis not present

## 2016-06-06 DIAGNOSIS — M5431 Sciatica, right side: Secondary | ICD-10-CM | POA: Diagnosis not present

## 2016-06-13 DIAGNOSIS — M9902 Segmental and somatic dysfunction of thoracic region: Secondary | ICD-10-CM | POA: Diagnosis not present

## 2016-06-13 DIAGNOSIS — M9904 Segmental and somatic dysfunction of sacral region: Secondary | ICD-10-CM | POA: Diagnosis not present

## 2016-06-13 DIAGNOSIS — M9901 Segmental and somatic dysfunction of cervical region: Secondary | ICD-10-CM | POA: Diagnosis not present

## 2016-06-13 DIAGNOSIS — M5431 Sciatica, right side: Secondary | ICD-10-CM | POA: Diagnosis not present

## 2016-06-16 DIAGNOSIS — H353131 Nonexudative age-related macular degeneration, bilateral, early dry stage: Secondary | ICD-10-CM | POA: Diagnosis not present

## 2016-06-16 DIAGNOSIS — Z961 Presence of intraocular lens: Secondary | ICD-10-CM | POA: Diagnosis not present

## 2016-06-20 ENCOUNTER — Telehealth: Payer: Self-pay | Admitting: Family Medicine

## 2016-06-20 DIAGNOSIS — M9901 Segmental and somatic dysfunction of cervical region: Secondary | ICD-10-CM | POA: Diagnosis not present

## 2016-06-20 DIAGNOSIS — M9902 Segmental and somatic dysfunction of thoracic region: Secondary | ICD-10-CM | POA: Diagnosis not present

## 2016-06-20 DIAGNOSIS — M9904 Segmental and somatic dysfunction of sacral region: Secondary | ICD-10-CM | POA: Diagnosis not present

## 2016-06-20 DIAGNOSIS — M5431 Sciatica, right side: Secondary | ICD-10-CM | POA: Diagnosis not present

## 2016-06-20 NOTE — Telephone Encounter (Signed)
Pt called in to schedule his wellness visit. Pt would like to know if he is due a follow up with provider?    Please advise for scheduling.

## 2016-06-20 NOTE — Telephone Encounter (Signed)
Yes when he left back in December of 2016, his AVS instructed him to return in 3-4 months. Please arrange an appt for him in next month or 2

## 2016-06-21 ENCOUNTER — Ambulatory Visit: Payer: Medicare Other

## 2016-06-21 NOTE — Telephone Encounter (Signed)
Called pt to schedule. Unable to reach him. Tried several times , fast busy signal.

## 2016-07-01 ENCOUNTER — Ambulatory Visit (INDEPENDENT_AMBULATORY_CARE_PROVIDER_SITE_OTHER): Payer: Medicare Other

## 2016-07-01 VITALS — BP 138/74 | HR 58 | Resp 16 | Ht 72.0 in | Wt 170.8 lb

## 2016-07-01 DIAGNOSIS — Z0000 Encounter for general adult medical examination without abnormal findings: Secondary | ICD-10-CM | POA: Diagnosis not present

## 2016-07-01 NOTE — Progress Notes (Signed)
RN AWV note reviewed. Agree with documention and plan. 

## 2016-07-01 NOTE — Progress Notes (Signed)
Subjective:   Daniel Reeves is a 82 y.o. male who presents for Medicare Annual/Subsequent preventive examination.  Review of Systems:  No ROS.  Medicare Wellness Visit.  Cardiac Risk Factors include: advanced age (>38men, >31 women);dyslipidemia;hypertension;male gender   Sleep patterns: Sleeps sporadically. Goes to bed around 9 and usually wakes up between 1-4 am. Naps during day. Gets up once nightly to void.    Home Safety/Smoke Alarms: Lives at home w/ wife. 5 stairs going into the house, which pt is able to navigate okay. Feels safe at home. Firearm Safety: No firearms. Seat Belt Safety/Bike Helmet: Wears seat belt.   Counseling:   Eye Exam- Dr. Len Childs yearly and PRN. Wears reading glasses. Dental- Dr. Linward Natal every 6 months  Male:   CCS- 03/09/11 w/ Dr. Ardis Hughs; moderate diverticulosis in sigmoid to descending colon, otherwise normal     PSA- Followed by Dr. Jeffie Pollock. Lab Results  Component Value Date   PSA 4.29 (H) 02/08/2011   PSA 7.31 (H) 07/04/2008       Objective:    Vitals: BP 138/74 (BP Location: Right Arm, Patient Position: Sitting, Cuff Size: Normal)   Pulse (!) 58   Resp 16   Ht 6' (1.829 m)   Wt 170 lb 12.8 oz (77.5 kg)   SpO2 98%   BMI 23.16 kg/m   Body mass index is 23.16 kg/m.  Tobacco History  Smoking Status  . Former Smoker  . Quit date: 11/28/1944  Smokeless Tobacco  . Never Used     Counseling given: Not Answered   Past Medical History:  Diagnosis Date  . Abdominal aortic aneurysm ([place])    a. [place] (1/14):  3.3 x 3.4 cm => f/u 11/2013  . Amebic dysentery   . AMEBIC DYSENTERY 11/19/2007   Qualifier: History of  By: Lenna Gilford MD, Deborra Medina   . Anxiety   . ANXIETY 11/19/2007   Qualifier: Diagnosis of  By: Lenna Gilford MD, Deborra Medina   . Atherosclerosis of coronary artery bypass graft with unstable angina pectoris ([place]) 04/19/2013  . BACK PAIN, LUMBAR 11/16/2007   Qualifier: Diagnosis of  By: Julien Girt CMA, [place]    . Benign prostatic hypertrophy   . BENIGN  PROSTATIC HYPERTROPHY, HX OF 11/16/2007   Qualifier: Diagnosis of  By: Julien Girt CMA, [place]    . CAD (coronary artery disease)    a. s/p CABG in 1979 and 1993;  b. LHC (5/14):  LM, LAD, CFX and RCA occluded; L-LAD ok, dLAD occluded after insertion of LIMA, S-OM occluded, S-PDA/AM 80-90 => PCI with Promus DES; EF 25%  . Cardiomyopathy, ischemic 06/05/2013  . Cerumen impaction    Bilateral  . CERUMEN IMPACTION, BILATERAL 02/03/2010   Qualifier: History of  By: Lenna Gilford MD, Deborra Medina   . Chicken pox as a child  . Chronic systolic CHF (congestive heart failure) ([place])   . COLONIC POLYPS 07/01/2008   Qualifier: Diagnosis of  By: Lenna Gilford MD, Deborra Medina   . Degenerative joint disease   . DEGENERATIVE JOINT DISEASE 11/16/2007   Qualifier: Diagnosis of  By: Julien Girt CMA, Marliss Czar    . Diverticulosis of colon   . DIVERTICULOSIS OF COLON 07/01/2008   Qualifier: Diagnosis of  By: Lenna Gilford MD, Deborra Medina   . Essential hypertension 11/16/2007   Qualifier: Diagnosis of  By: Julien Girt CMA, Marliss Czar    . FLANK PAIN, RIGHT 02/03/2010   Qualifier: History of  By: Lenna Gilford MD, Deborra Medina   . GERD 11/16/2007   Qualifier: Diagnosis of  By: Julien Girt CMA, [place]    . GERD (gastroesophageal reflux disease)   . Gout   . GOUT 11/16/2007   Qualifier: Diagnosis of  By: Julien Girt CMA, Marliss Czar    . Hearing loss 11/24/2014  . Heart murmur   . Hypercholesterolemia   . HYPERCHOLESTEROLEMIA 11/16/2007   Qualifier: Diagnosis of  By: Julien Girt CMA, Marliss Czar    . Hypertension   . Ischemic cardiomyopathy    a. echo (09/05/13): EF 35%, diffuse HK worsened distal septal, mid/distal inferior and apical region, grade 1 diastolic dysfunction, mild LAE.    [name] Kidney stone 08/17/2011  . Loss of hearing   . Lumbar back pain   . Measles as a child  . Medicare annual wellness visit, subsequent 11/24/2014   Sees Dr Delman Cheadle for dermatology Sees Dr Roni Bread of Urology Sees Dr Stanford Breed of cardiology Sees Dr Virginia Rochester of Opthamology No further colonoscopies warranted       . Mumps as a child  .  Nephrolithiasis   . Pain in joint, lower leg 07/29/2014  . PERIPHERAL VASCULAR DISEASE 11/16/2007   Qualifier: Diagnosis of  By: Julien Girt CMA, Marliss Czar    . Peripheral vascular disease ([place])   . Shingles 07/29/2014  . Sun-damaged skin 05/31/2014   Past Surgical History:  Procedure Laterality Date  . CORONARY ANGIOPLASTY WITH STENT PLACEMENT  04/18/2013   RCA       . CORONARY ARTERY BYPASS GRAFT  1979   x4 SVG-DIAG-LAD, SVG-OM-PDA  . CORONARY ARTERY BYPASS GRAFT  1993   Redo x5 by Dr Harlow Asa; [facility], SVG-OM, SVG-AM-PL  . Decompressive laminectomy  01/2006   L2 - scarum by Dr. Shellia Carwin  . HEMORRHOID SURGERY     fissure with hemorrhoid corrected at age 35  . INGUINAL HERNIA REPAIR  1994   Right by Dr Harlow Asa  . INGUINAL HERNIA REPAIR  1996   Left by Dr. Harlow Asa  . LEFT HEART CATHETERIZATION WITH CORONARY ANGIOGRAM N/A 09/23/2013   Procedure: LEFT HEART CATHETERIZATION WITH CORONARY ANGIOGRAM;  Surgeon: Blane Ohara, MD;  Location: [facility] CATH LAB;  Service: Cardiovascular;  Laterality: N/A;  . PERCUTANEOUS CORONARY STENT INTERVENTION (PCI-S) N/A 04/18/2013   Procedure: PERCUTANEOUS CORONARY STENT INTERVENTION (PCI-S);  Surgeon: Sherren Mocha, MD;  Location: [facility] CATH LAB;  Service: Cardiovascular;  Laterality: N/A;  . TONSILLECTOMY     Family History  Problem Relation Age of Onset  . Parkinsonism Brother   . Diabetes Maternal Grandmother   . Depression Daughter   . Other Son     4 stents  . Heart disease Son   . Diabetes Son     type 2   History  Sexual Activity  . Sexual activity: Not Currently    Comment: lives with wife, no dietary restrictions.     Outpatient Encounter Prescriptions as of 07/01/2016  Medication Sig  . allopurinol (ZYLOPRIM) 300 MG tablet Take 1 tablet (300 mg total) by mouth daily with breakfast.  . aspirin EC 81 MG tablet Take 81 mg by mouth every morning.   . B Complex-C (B-COMPLEX WITH VITAMIN C) tablet Take 1 tablet by mouth daily.  . Cholecalciferol  (VITAMIN D-3 PO) Take 5,000 Units by mouth daily with breakfast.   . clopidogrel (PLAVIX) 75 MG tablet TAKE 1 BY MOUTH EVERY MORNING  . finasteride (PROSCAR) 5 MG tablet Takes every third day  . folic acid (FOLVITE) A999333 MCG tablet Take 400 mcg by mouth 2 (two) times daily.   . isosorbide mononitrate (IMDUR) 60 MG 24 hr  tablet Take 1 tablet (60 mg total) by mouth daily.  [name] losartan (COZAAR) 50 MG tablet Take 1 tablet (50 mg total) by mouth daily.  . metoprolol succinate (TOPROL-XL) 25 MG 24 hr tablet Take 0.5 tablets (12.5 mg total) by mouth daily.  . Misc Natural Products (OSTEO BI-FLEX ADV JOINT SHIELD) TABS Take 1 tablet by mouth 2 (two) times daily.   . nitroGLYCERIN (NITROSTAT) 0.4 MG SL tablet Place 1 tablet (0.4 mg total) under the tongue every 5 (five) minutes as needed. For chest pain.  . simvastatin (ZOCOR) 40 MG tablet TAKE 1 BY MOUTH EVERY EVENING   No facility-administered encounter medications on file as of 07/01/2016.     Activities of Daily Living In your present state of health, do you have any difficulty performing the following activities: 07/01/2016  Hearing? N  Vision? N  Difficulty concentrating or making decisions? N  Walking or climbing stairs? N  Dressing or bathing? N  Doing errands, shopping? N  Preparing Food and eating ? N  Using the Toilet? N  In the past six months, have you accidently leaked urine? N  Do you have problems with loss of bowel control? Y  Managing your Medications? N  Managing your Finances? N  Housekeeping or managing your Housekeeping? N  Some recent data might be hidden    Patient Care Team: [name], MD as PCP - General (Family Medicine) Irine Seal, MD as Consulting Physician (Urology) Lelon Perla, MD as Consulting Physician (Cardiology) Iona Beard, MD as Consulting Physician (Optometry) Arlyn Leak, MD (Dentistry)   Assessment:    HEENT: Pt reports h/o cerumen impaction and requested ear exam. R ear canal clear. TM  clearly visualized and normal. L ear canal w/ moderate amount of cerumen, TM not occluded. Pt prefers to try home treatment w/ peroxide drops vs irrigation in office, instructions printed for pt.   GI: Pt reports 6 months of bright red blood up to amount of '3 postage stamps' on toilet paper occasionally after straining w/ BM. Occasional dark stools, pt feels this is related to dietary intake of iron-rich foods. Denies abdominal pain, rectal bleeding not associated w/ BM, overtly bloody stools. Appt scheduled w/ Elyn Aquas, PA-C (pt requests male provider). Discussed red flags that should prompt ED eval.  Remainder of physical assessment deferred to PCP.  Exercise Activities and Dietary recommendations Current Exercise Habits: The patient has a physically strenous job, but has no regular exercise apart from work.;Home exercise routine, Type of exercise: walking (working in the garden, mowing the lawn), Intensity: Moderate  Diet (meal preparation, eat out, water intake, caffeinated beverages, dairy products, fruits and vegetables): Able to prepare meals, but wife does most of the cooking. They try to eat all of their meals at home. Eats lots of vegetables from their garden (omatoes, cucumbers, zucchini, etc.). Meat, starch, and vegetable at every meal.     Goals    . Patient Stated (pt-stated)          Maintain current state of health.      Fall Risk Fall Risk  07/01/2016 11/24/2014 11/24/2014  Falls in the past year? Yes No Yes  Number falls in past yr: 1 - 1  Injury with Fall? No - No   Depression Screen PHQ 2/9 Scores 07/01/2016 11/24/2014 11/24/2014 03/19/2013  PHQ - 2 Score 0 0 0 0    Cognitive Testing MMSE - Mini Mental State Exam 07/01/2016  Orientation to time 4  Orientation to  Place 5  Registration 3  Attention/ Calculation 5  Recall 3  Language- name 2 objects 2  Language- repeat 1  Language- follow 3 step command 3  Language- read & follow direction 1  Write a sentence 1    Copy design 1  Total score 29    Immunization History  Administered Date(s) Administered  . Influenza,inj,Quad PF,36+ Mos 10/10/2013, 11/24/2014, 11/16/2015  . Pneumococcal Conjugate-13 11/16/2015  . Pneumococcal Polysaccharide-23 07/11/2013  . Tdap 07/11/2013  . Zoster 12/17/2015   Screening Tests Health Maintenance  Topic Date Due  . INFLUENZA VACCINE  07/29/2016 (Originally 06/28/2016)  . TETANUS/TDAP  07/12/2023  . ZOSTAVAX  Completed  . PNA vac Low Risk Adult  Completed      Plan:   Bring a copy of your living will and healthcare power of attorney to your next appointment.  Start taking your probiotic as previously directed by Dr. Charlett Blake.  Follow up w/ Elyn Aquas, PA-C and Dr. Charlett Blake as directed.   During the course of the visit the patient was educated and counseled about the following appropriate screening and preventive services:    Vaccines to include Pneumoccal, Influenza, Hepatitis B, Td, Zostavax, HCV  Cardiovascular Disease  Colorectal cancer screening  Diabetes screening  Prostate Cancer Screening  Glaucoma screening  Nutrition counseling   Patient Instructions (the written plan) was given to the patient.    Dorrene German, RN  07/01/2016

## 2016-07-01 NOTE — Patient Instructions (Addendum)
Bring a copy of your living will and healthcare power of attorney to your next appointment.  Start taking your probiotic as directed by Dr. Charlett Blake.  Follow up w/ Elyn Aquas, PA-C and Dr. Charlett Blake as directed.   Carbamide Peroxide ear solution What is this medicine? CARBAMIDE PEROXIDE (CAR bah mide per OX ide) is used to soften and help remove ear wax. This medicine may be used for other purposes; ask your health care provider or pharmacist if you have questions. What should I tell my health care provider before I take this medicine? They need to know if you have any of these conditions: -dizziness -ear discharge -ear pain, irritation or rash -infection -perforated eardrum (hole in eardrum) -an unusual or allergic reaction to carbamide peroxide, glycerin, hydrogen peroxide, other medicines, foods, dyes, or preservatives -pregnant or trying to get pregnant -breast-feeding How should I use this medicine? This medicine is only for use in the outer ear canal. Follow the directions carefully. Wash hands before and after use. The solution may be warmed by holding the bottle in the hand for 1 to 2 minutes. Lie with the affected ear facing upward. Place the proper number of drops into the ear canal. After the drops are instilled, remain lying with the affected ear upward for 5 minutes to help the drops stay in the ear canal. A cotton ball may be gently inserted at the ear opening for no longer than 5 to 10 minutes to ensure retention. Repeat, if necessary, for the opposite ear. Do not touch the tip of the dropper to the ear, fingertips, or other surface. Do not rinse the dropper after use. Keep container tightly closed. Talk to your pediatrician regarding the use of this medicine in children. While this drug may be used in children as young as 12 years for selected conditions, precautions do apply. Overdosage: If you think you have taken too much of this medicine contact a poison control center or emergency  room at once. NOTE: This medicine is only for you. Do not share this medicine with others. What if I miss a dose? If you miss a dose, use it as soon as you can. If it is almost time for your next dose, use only that dose. Do not use double or extra doses. What may interact with this medicine? Interactions are not expected. Do not use any other ear products without asking your doctor or health care professional. This list may not describe all possible interactions. Give your health care provider a list of all the medicines, herbs, non-prescription drugs, or dietary supplements you use. Also tell them if you smoke, drink alcohol, or use illegal drugs. Some items may interact with your medicine. What should I watch for while using this medicine? This medicine is not for long-term use. Do not use for more than 4 days without checking with your health care professional. Contact your doctor or health care professional if your condition does not start to get better within a few days or if you notice burning, redness, itching or swelling. What side effects may I notice from receiving this medicine? Side effects that you should report to your doctor or health care professional as soon as possible: -allergic reactions like skin rash, itching or hives, swelling of the face, lips, or tongue -burning, itching, and redness -worsening ear pain -rash Side effects that usually do not require medical attention (report to your doctor or health care professional if they continue or are bothersome): -abnormal sensation while putting  the drops in the ear -temporary reduction in hearing (but not complete loss of hearing) This list may not describe all possible side effects. Call your doctor for medical advice about side effects. You may report side effects to FDA at 1-800-FDA-1088. Where should I keep my medicine? Keep out of the reach of children. Store at room temperature between 15 and 30 degrees C (59 and 86  degrees F) in a tight, light-resistant container. Keep bottle away from excessive heat and direct sunlight. Throw away any unused medicine after the expiration date. NOTE: This sheet is a summary. It may not cover all possible information. If you have questions about this medicine, talk to your doctor, pharmacist, or health care provider.    2016, Elsevier/Gold Standard. (2008-02-26 14:00:02)

## 2016-07-01 NOTE — Progress Notes (Signed)
Pre visit review using our clinic review tool, if applicable. No additional management support is needed unless otherwise documented below in the visit note. 

## 2016-07-05 ENCOUNTER — Encounter: Payer: Self-pay | Admitting: Physician Assistant

## 2016-07-05 ENCOUNTER — Ambulatory Visit (INDEPENDENT_AMBULATORY_CARE_PROVIDER_SITE_OTHER): Payer: Medicare Other | Admitting: Physician Assistant

## 2016-07-05 VITALS — BP 142/68 | HR 56 | Temp 98.5°F | Resp 16 | Ht 72.0 in | Wt 171.1 lb

## 2016-07-05 DIAGNOSIS — I255 Ischemic cardiomyopathy: Secondary | ICD-10-CM | POA: Diagnosis not present

## 2016-07-05 DIAGNOSIS — K648 Other hemorrhoids: Secondary | ICD-10-CM | POA: Diagnosis not present

## 2016-07-05 MED ORDER — HYDROCORTISONE ACETATE 25 MG RE SUPP: 25.0000 mg | Freq: Two times a day (BID) | RECTAL | 0 refills | Status: DC

## 2016-07-05 NOTE — Progress Notes (Signed)
Pre visit review using our clinic review tool, if applicable. No additional management support is needed unless otherwise documented below in the visit note/SLS  

## 2016-07-05 NOTE — Patient Instructions (Signed)
Please stay well hydrated. Keep good fiber intake to soften stools.  Use the steroid suppository as directed. If symptoms are not improving/resolving, let me know so I can set you up with a specialist.

## 2016-07-05 NOTE — Progress Notes (Signed)
Patient presents to clinic today c/o 2-3 months of bright blood noted with wiping. Notes this is intermittent, happening maybe once a week. Endorses occasional hard stool. Denies pain with bowel movement. Denies melena or hematochezia. Does have a history of anal fissure s/p repair and internal hemorrhoids.  Past Medical History:  Diagnosis Date  . Abdominal aortic aneurysm ([place])    a. [place] (1/14):  3.3 x 3.4 cm => f/u 11/2013  . Amebic dysentery   . AMEBIC DYSENTERY 11/19/2007   Qualifier: History of  By: Lenna Gilford MD, Deborra Medina   . Anxiety   . ANXIETY 11/19/2007   Qualifier: Diagnosis of  By: Lenna Gilford MD, Deborra Medina   . 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 BACK PAIN, LUMBAR 11/16/2007   Qualifier: Diagnosis of  By: Julien Girt CMA, Leigh    . Benign prostatic hypertrophy   . BENIGN PROSTATIC HYPERTROPHY, HX OF 11/16/2007   Qualifier: Diagnosis of  By: Julien Girt CMA, Leigh    . CAD (coronary artery disease)    a. s/p CABG in 1979 and 1993;  b. LHC (5/14):  LM, LAD, CFX and RCA occluded; L-LAD ok, dLAD occluded after insertion of LIMA, S-OM occluded, S-PDA/AM 80-90 => PCI with Promus DES; EF 25%  . Cardiomyopathy, ischemic 06/05/2013  . Cerumen impaction    Bilateral  . CERUMEN IMPACTION, BILATERAL 02/03/2010   Qualifier: History of  By: Lenna Gilford MD, Deborra Medina   . Chicken pox as a child  . Chronic systolic CHF (congestive heart failure) ([place])   . COLONIC POLYPS 07/01/2008   Qualifier: Diagnosis of  By: Lenna Gilford MD, Deborra Medina   . Degenerative joint disease   . DEGENERATIVE JOINT DISEASE 11/16/2007   Qualifier: Diagnosis of  By: Julien Girt CMA, Marliss Czar    . Diverticulosis of colon   . DIVERTICULOSIS OF COLON 07/01/2008   Qualifier: Diagnosis of  By: Lenna Gilford MD, Deborra Medina   . Essential hypertension 11/16/2007   Qualifier: Diagnosis of  By: Julien Girt CMA, Marliss Czar    . FLANK PAIN, RIGHT 02/03/2010   Qualifier: History of  By: Lenna Gilford MD, Deborra Medina   . GERD 11/16/2007   Qualifier: Diagnosis of  By: Julien Girt CMA, Marliss Czar    . GERD (gastroesophageal reflux disease)   . Gout   . GOUT 11/16/2007   Qualifier: Diagnosis of  By: Julien Girt CMA, Marliss Czar    . Hearing loss 11/24/2014  . Heart murmur   . Hypercholesterolemia   . HYPERCHOLESTEROLEMIA 11/16/2007   Qualifier: Diagnosis of  By: Julien Girt CMA, Marliss Czar    . Hypertension   . Ischemic cardiomyopathy    a. echo (09/05/13): EF 35%, diffuse HK worsened distal septal, mid/distal inferior and apical region, grade 1 diastolic dysfunction, mild LAE.    [name] Kidney stone 08/17/2011  . Loss of hearing   . Lumbar back pain   . Measles as a child  . Medicare annual wellness visit, subsequent 11/24/2014   Sees Dr Delman Cheadle for dermatology Sees Dr Roni Bread of Urology Sees Dr Stanford Breed of cardiology Sees Dr [place] Rochester of Opthamology No further colonoscopies warranted       . Mumps as a child  . Nephrolithiasis   . Pain in joint, lower leg 07/29/2014  . PERIPHERAL VASCULAR DISEASE 11/16/2007   Qualifier: Diagnosis of  By: Julien Girt CMA, Marliss Czar    . Peripheral vascular disease ([place])   . Shingles 07/29/2014  . Sun-damaged skin 05/31/2014    Current Outpatient Prescriptions on File  Prior to Visit  Medication Sig Dispense Refill  . allopurinol (ZYLOPRIM) 300 MG tablet Take 1 tablet (300 mg total) by mouth daily with breakfast. 90 tablet 1  . aspirin EC 81 MG tablet Take 81 mg by mouth every morning.     . B Complex-C (B-COMPLEX WITH VITAMIN C) tablet Take 1 tablet by mouth daily.    . Cholecalciferol (VITAMIN D-3 PO) Take 5,000 Units by mouth daily with breakfast.     . clopidogrel (PLAVIX) 75 MG tablet TAKE 1 BY MOUTH EVERY MORNING 90 tablet 0  . finasteride (PROSCAR) 5 MG tablet Takes every third day    . folic acid (FOLVITE) A999333 MCG tablet Take 400 mcg by mouth 2 (two) times daily.     . isosorbide mononitrate (IMDUR) 60 MG 24 hr tablet Take 1 tablet (60 mg total) by mouth daily. 90 tablet 3  . losartan (COZAAR) 50 MG tablet Take 1 tablet (50 mg total)  by mouth daily. 90 tablet 3  . metoprolol succinate (TOPROL-XL) 25 MG 24 hr tablet Take 0.5 tablets (12.5 mg total) by mouth daily. 45 tablet 1  . Misc Natural Products (OSTEO BI-FLEX ADV JOINT SHIELD) TABS Take 1 tablet by mouth 2 (two) times daily.     . nitroGLYCERIN (NITROSTAT) 0.4 MG SL tablet Place 1 tablet (0.4 mg total) under the tongue every 5 (five) minutes as needed. For chest pain. 25 tablet 6  . simvastatin (ZOCOR) 40 MG tablet TAKE 1 BY MOUTH EVERY EVENING 90 tablet 3   No current facility-administered medications on file prior to visit.     Allergies  Allergen Reactions  . Lisinopril     REACTION: dizziness  . Methocarbamol     REACTION: pt states "dizzy"  . Pregabalin     REACTION: pt states "dizzy"  . Ramipril     REACTION: hives and dizziness    Family History  Problem Relation Age of Onset  . Parkinsonism Brother   . Diabetes Maternal Grandmother   . Depression Daughter   . Other Son     4 stents  . Heart disease Son   . Diabetes Son     type 2    Social History   Social History  . Marital status: Married    Spouse name: [name] x 64 yrs  . Number of children: N/A  . Years of education: N/A   Occupational History  . 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 Smoking status: Former Smoker    Quit date: 11/28/1944  . Smokeless tobacco: Never Used  . Alcohol use 7.0 oz/week    14 Standard drinks or equivalent per week     Comment: daily rum  or wine  . Drug use: No  . Sexual activity: Not Currently     Comment: lives with wife, no dietary restrictions.    Other Topics Concern  . None   Social History Narrative   Married   7 children   Review of Systems - See HPI.  All other ROS are negative.  BP (!) 142/68 (BP Location: Right Arm, Patient Position: Sitting, Cuff Size: Normal)   Pulse (!) 56   Temp 98.5 F (36.9 C) (Oral)   Resp 16   Ht 6' (1.829 m)   Wt 171 lb 2 oz (77.6 kg)   SpO2 98%   BMI 23.21  kg/m   Physical Exam  Constitutional: He is oriented to person, place,  and time and well-developed, well-nourished, and in no distress.  Cardiovascular: Normal rate, regular rhythm, normal heart sounds and intact distal pulses.   Pulmonary/Chest: Effort normal.  Genitourinary: Rectal exam shows internal hemorrhoid. Rectal exam shows no external hemorrhoid, no fissure, no mass and guaiac negative stool.  Genitourinary Comments: External anal skin tags noted, likely remnants from prior hemorrhoids  Neurological: He is alert and oriented to person, place, and time.  Skin: Skin is warm and dry. No rash noted.  Vitals reviewed.  Assessment/Plan: 1. Internal hemorrhoid Noted on rectal examination. Hemoccult negative. We'll begin hydrocortisone suppository twice a day 6 days. Supportive and dietary measures reviewed with patient. Follow-up if symptoms are not resolving. - hydrocortisone (ANUSOL-HC) 25 MG suppository; Place 1 suppository (25 mg total) rectally 2 (two) times daily.  Dispense: 12 suppository; Refill: 0   Leeanne Rio, [place]

## 2016-07-06 ENCOUNTER — Encounter: Payer: Self-pay | Admitting: Family Medicine

## 2016-07-11 DIAGNOSIS — M9904 Segmental and somatic dysfunction of sacral region: Secondary | ICD-10-CM | POA: Diagnosis not present

## 2016-07-11 DIAGNOSIS — M5431 Sciatica, right side: Secondary | ICD-10-CM | POA: Diagnosis not present

## 2016-07-11 DIAGNOSIS — M9901 Segmental and somatic dysfunction of cervical region: Secondary | ICD-10-CM | POA: Diagnosis not present

## 2016-07-11 DIAGNOSIS — M9902 Segmental and somatic dysfunction of thoracic region: Secondary | ICD-10-CM | POA: Diagnosis not present

## 2016-07-25 DIAGNOSIS — M9901 Segmental and somatic dysfunction of cervical region: Secondary | ICD-10-CM | POA: Diagnosis not present

## 2016-07-25 DIAGNOSIS — M9902 Segmental and somatic dysfunction of thoracic region: Secondary | ICD-10-CM | POA: Diagnosis not present

## 2016-07-25 DIAGNOSIS — M5431 Sciatica, right side: Secondary | ICD-10-CM | POA: Diagnosis not present

## 2016-07-25 DIAGNOSIS — M9904 Segmental and somatic dysfunction of sacral region: Secondary | ICD-10-CM | POA: Diagnosis not present

## 2016-07-27 ENCOUNTER — Telehealth: Payer: Self-pay | Admitting: Family Medicine

## 2016-07-27 NOTE — Telephone Encounter (Signed)
Pt dropped off copy of Power of Attorney and Living Will for PCP to see and put on pt's chart (document in a big yellow envelope put at front office tray)

## 2016-08-01 ENCOUNTER — Other Ambulatory Visit: Payer: Self-pay | Admitting: Cardiology

## 2016-08-02 ENCOUNTER — Other Ambulatory Visit: Payer: Self-pay | Admitting: *Deleted

## 2016-08-02 MED ORDER — CLOPIDOGREL BISULFATE 75 MG PO TABS: 75.0000 mg | ORAL_TABLET | Freq: Every day | ORAL | 1 refills | Status: DC

## 2016-08-03 ENCOUNTER — Other Ambulatory Visit: Payer: Self-pay | Admitting: Family Medicine

## 2016-08-08 DIAGNOSIS — M9901 Segmental and somatic dysfunction of cervical region: Secondary | ICD-10-CM | POA: Diagnosis not present

## 2016-08-08 DIAGNOSIS — M9904 Segmental and somatic dysfunction of sacral region: Secondary | ICD-10-CM | POA: Diagnosis not present

## 2016-08-08 DIAGNOSIS — M5431 Sciatica, right side: Secondary | ICD-10-CM | POA: Diagnosis not present

## 2016-08-08 DIAGNOSIS — M9902 Segmental and somatic dysfunction of thoracic region: Secondary | ICD-10-CM | POA: Diagnosis not present

## 2016-08-22 DIAGNOSIS — M9904 Segmental and somatic dysfunction of sacral region: Secondary | ICD-10-CM | POA: Diagnosis not present

## 2016-08-22 DIAGNOSIS — M9901 Segmental and somatic dysfunction of cervical region: Secondary | ICD-10-CM | POA: Diagnosis not present

## 2016-08-22 DIAGNOSIS — M9902 Segmental and somatic dysfunction of thoracic region: Secondary | ICD-10-CM | POA: Diagnosis not present

## 2016-08-22 DIAGNOSIS — M5431 Sciatica, right side: Secondary | ICD-10-CM | POA: Diagnosis not present

## 2016-08-23 ENCOUNTER — Ambulatory Visit: Payer: Medicare Other | Admitting: Family Medicine

## 2016-08-25 ENCOUNTER — Ambulatory Visit (INDEPENDENT_AMBULATORY_CARE_PROVIDER_SITE_OTHER): Payer: Medicare Other | Admitting: *Deleted

## 2016-08-25 DIAGNOSIS — Z23 Encounter for immunization: Secondary | ICD-10-CM

## 2016-09-02 ENCOUNTER — Ambulatory Visit: Payer: Medicare Other

## 2016-09-12 DIAGNOSIS — M9902 Segmental and somatic dysfunction of thoracic region: Secondary | ICD-10-CM | POA: Diagnosis not present

## 2016-09-12 DIAGNOSIS — M9904 Segmental and somatic dysfunction of sacral region: Secondary | ICD-10-CM | POA: Diagnosis not present

## 2016-09-12 DIAGNOSIS — M9901 Segmental and somatic dysfunction of cervical region: Secondary | ICD-10-CM | POA: Diagnosis not present

## 2016-09-12 DIAGNOSIS — M5431 Sciatica, right side: Secondary | ICD-10-CM | POA: Diagnosis not present

## 2016-09-23 DIAGNOSIS — M9901 Segmental and somatic dysfunction of cervical region: Secondary | ICD-10-CM | POA: Diagnosis not present

## 2016-09-23 DIAGNOSIS — M9902 Segmental and somatic dysfunction of thoracic region: Secondary | ICD-10-CM | POA: Diagnosis not present

## 2016-09-23 DIAGNOSIS — M9904 Segmental and somatic dysfunction of sacral region: Secondary | ICD-10-CM | POA: Diagnosis not present

## 2016-09-23 DIAGNOSIS — M5431 Sciatica, right side: Secondary | ICD-10-CM | POA: Diagnosis not present

## 2016-10-03 DIAGNOSIS — M9901 Segmental and somatic dysfunction of cervical region: Secondary | ICD-10-CM | POA: Diagnosis not present

## 2016-10-03 DIAGNOSIS — M9902 Segmental and somatic dysfunction of thoracic region: Secondary | ICD-10-CM | POA: Diagnosis not present

## 2016-10-03 DIAGNOSIS — M9904 Segmental and somatic dysfunction of sacral region: Secondary | ICD-10-CM | POA: Diagnosis not present

## 2016-10-03 DIAGNOSIS — M5431 Sciatica, right side: Secondary | ICD-10-CM | POA: Diagnosis not present

## 2016-10-04 DIAGNOSIS — N5201 Erectile dysfunction due to arterial insufficiency: Secondary | ICD-10-CM | POA: Diagnosis not present

## 2016-10-04 DIAGNOSIS — N401 Enlarged prostate with lower urinary tract symptoms: Secondary | ICD-10-CM | POA: Diagnosis not present

## 2016-10-04 DIAGNOSIS — R351 Nocturia: Secondary | ICD-10-CM | POA: Diagnosis not present

## 2016-10-24 DIAGNOSIS — M9901 Segmental and somatic dysfunction of cervical region: Secondary | ICD-10-CM | POA: Diagnosis not present

## 2016-10-24 DIAGNOSIS — M9904 Segmental and somatic dysfunction of sacral region: Secondary | ICD-10-CM | POA: Diagnosis not present

## 2016-10-24 DIAGNOSIS — M9902 Segmental and somatic dysfunction of thoracic region: Secondary | ICD-10-CM | POA: Diagnosis not present

## 2016-10-24 DIAGNOSIS — M5431 Sciatica, right side: Secondary | ICD-10-CM | POA: Diagnosis not present

## 2016-10-25 ENCOUNTER — Telehealth: Payer: Self-pay | Admitting: Family Medicine

## 2016-10-25 NOTE — Telephone Encounter (Signed)
Advanced Directive packet filed at front desk for pick up.

## 2016-10-25 NOTE — Telephone Encounter (Signed)
Relation to [id number] Call back number:[phone]   Reason for call:  Patient would like to pick up POA forms today before 3pm, patient states he has a meeting at 3pm with the lawyer please advise.

## 2016-10-25 NOTE — Telephone Encounter (Signed)
Patient informed. 

## 2016-10-28 ENCOUNTER — Ambulatory Visit (INDEPENDENT_AMBULATORY_CARE_PROVIDER_SITE_OTHER): Payer: Medicare Other | Admitting: Family Medicine

## 2016-10-28 ENCOUNTER — Encounter: Payer: Self-pay | Admitting: Family Medicine

## 2016-10-28 VITALS — BP 140/62 | HR 51 | Temp 98.4°F | Ht 72.0 in | Wt 175.0 lb

## 2016-10-28 DIAGNOSIS — M25562 Pain in left knee: Secondary | ICD-10-CM | POA: Diagnosis not present

## 2016-10-28 DIAGNOSIS — I2510 Atherosclerotic heart disease of native coronary artery without angina pectoris: Secondary | ICD-10-CM | POA: Diagnosis not present

## 2016-10-28 DIAGNOSIS — M109 Gout, unspecified: Secondary | ICD-10-CM | POA: Diagnosis not present

## 2016-10-28 DIAGNOSIS — I2583 Coronary atherosclerosis due to lipid rich plaque: Secondary | ICD-10-CM

## 2016-10-28 DIAGNOSIS — E7800 Pure hypercholesterolemia, unspecified: Secondary | ICD-10-CM | POA: Diagnosis not present

## 2016-10-28 DIAGNOSIS — K625 Hemorrhage of anus and rectum: Secondary | ICD-10-CM

## 2016-10-28 DIAGNOSIS — H6191 Disorder of right external ear, unspecified: Secondary | ICD-10-CM

## 2016-10-28 DIAGNOSIS — I255 Ischemic cardiomyopathy: Secondary | ICD-10-CM

## 2016-10-28 DIAGNOSIS — I10 Essential (primary) hypertension: Secondary | ICD-10-CM

## 2016-10-28 DIAGNOSIS — I25700 Atherosclerosis of coronary artery bypass graft(s), unspecified, with unstable angina pectoris: Secondary | ICD-10-CM | POA: Diagnosis not present

## 2016-10-28 DIAGNOSIS — L989 Disorder of the skin and subcutaneous tissue, unspecified: Secondary | ICD-10-CM

## 2016-10-28 DIAGNOSIS — K219 Gastro-esophageal reflux disease without esophagitis: Secondary | ICD-10-CM

## 2016-10-28 DIAGNOSIS — M545 Low back pain: Secondary | ICD-10-CM

## 2016-10-28 LAB — CBC
HCT: 38.5 % — ABNORMAL LOW (ref 39.0–52.0)
Hemoglobin: 13.1 g/dL (ref 13.0–17.0)
MCHC: 34 g/dL (ref 30.0–36.0)
MCV: 97.2 fl (ref 78.0–100.0)
Platelets: 211 10*3/uL (ref 150.0–400.0)
RBC: 3.96 Mil/uL — ABNORMAL LOW (ref 4.22–5.81)
RDW: 14.4 % (ref 11.5–15.5)
WBC: 7.5 10*3/uL (ref 4.0–10.5)

## 2016-10-28 LAB — COMPREHENSIVE METABOLIC PANEL
ALT: 19 U/L (ref 0–53)
AST: 26 U/L (ref 0–37)
Albumin: 3.9 g/dL (ref 3.5–5.2)
Alkaline Phosphatase: 63 U/L (ref 39–117)
BILIRUBIN TOTAL: 0.8 mg/dL (ref 0.2–1.2)
BUN: 20 mg/dL (ref 6–23)
CHLORIDE: 109 meq/L (ref 96–112)
CO2: 27 meq/L (ref 19–32)
CREATININE: 1.01 mg/dL (ref 0.40–1.50)
Calcium: 9.4 mg/dL (ref 8.4–10.5)
GFR: 73.67 mL/min (ref >60.00)
GLUCOSE: 95 mg/dL (ref 70–99)
Potassium: 5.2 mEq/L — ABNORMAL HIGH (ref 3.5–5.1)
SODIUM: 142 meq/L (ref 135–145)
Total Protein: 6.8 g/dL (ref 6.0–8.3)

## 2016-10-28 LAB — LIPID PANEL
CHOL/HDL RATIO: 3
Cholesterol: 99 mg/dL (ref 0–200)
HDL: 36.3 mg/dL — ABNORMAL LOW (ref >39.00)
LDL Cholesterol: 48 mg/dL (ref 0–99)
NonHDL: 62.42
Triglycerides: 73 mg/dL (ref 0.0–149.0)
VLDL: 14.6 mg/dL (ref 0.0–40.0)

## 2016-10-28 LAB — TSH: TSH: 2.75 u[IU]/mL (ref 0.35–4.50)

## 2016-10-28 LAB — URIC ACID: URIC ACID, SERUM: 3.1 mg/dL — AB (ref 4.0–7.8)

## 2016-10-28 NOTE — Patient Instructions (Addendum)
Tylenol/Acetaminophen ES 500 mg, 1-2 tabs twice daily for pain (max of 3000 mg in 24 hours or 6 tabs) [place], Aspercreme or Icy Hot Lidocaine patch daily for pain NOW company is the probiotic 10 strains 1 cap daily, at [employer]   In 1-2 weeks. Add Benefiber twice daily if urgency persists.  Witch Hazel Astringent to cleanse after BM  Hypertension Hypertension, commonly called high blood pressure, is when the force of blood pumping through your arteries is too strong. Your arteries are the blood vessels that carry blood from your heart throughout your body. A blood pressure reading consists of a higher number over a lower number, such as 110/72. The higher number (systolic) is the pressure inside your arteries when your heart pumps. The lower number (diastolic) is the pressure inside your arteries when your heart relaxes. Ideally you want your blood pressure below 120/80. Hypertension forces your heart to work harder to pump blood. Your arteries may become narrow or stiff. Having untreated or uncontrolled hypertension can cause heart attack, stroke, kidney disease, and other problems. What increases the risk? Some risk factors for high blood pressure are controllable. Others are not. Risk factors you cannot control include:  Race. You may be at higher risk if you are African American.  Age. Risk increases with age.  Gender. Men are at higher risk than women before age 55 years. After age 26, women are at higher risk than men. Risk factors you can control include:  Not getting enough exercise or physical activity.  Being overweight.  Getting too much fat, sugar, calories, or salt in your diet.  Drinking too much alcohol. What are the signs or symptoms? Hypertension does not usually cause signs or symptoms. Extremely high blood pressure (hypertensive crisis) may cause headache, anxiety, shortness of breath, and nosebleed. How is this diagnosed? To check if you have hypertension, your  health care provider will measure your blood pressure while you are seated, with your arm held at the level of your heart. It should be measured at least twice using the same arm. Certain conditions can cause a difference in blood pressure between your right and left arms. A blood pressure reading that is higher than normal on one occasion does not mean that you need treatment. If it is not clear whether you have high blood pressure, you may be asked to return on a different day to have your blood pressure checked again. Or, you may be asked to monitor your blood pressure at home for 1 or more weeks. How is this treated? Treating high blood pressure includes making lifestyle changes and possibly taking medicine. Living a healthy lifestyle can help lower high blood pressure. You may need to change some of your habits. Lifestyle changes may include:  Following the DASH diet. This diet is high in fruits, vegetables, and whole grains. It is low in salt, red meat, and added sugars.  Keep your sodium intake below 2,300 mg per day.  Getting at least 30-45 minutes of aerobic exercise at least 4 times per week.  Losing weight if necessary.  Not smoking.  Limiting alcoholic beverages.  Learning ways to reduce stress. Your health care provider may prescribe medicine if lifestyle changes are not enough to get your blood pressure under control, and if one of the following is true:  You are 42-46 years of age and your systolic blood pressure is above 140.  You are 70 years of age or older, and your systolic blood pressure is above  [id number].  Your diastolic blood pressure is above 90.  You have diabetes, and your systolic blood pressure is over [id number] or your diastolic blood pressure is over 90.  You have kidney disease and your blood pressure is above 140/90.  You have heart disease and your blood pressure is above 140/90. Your personal target blood pressure may vary depending on your medical conditions,  your age, and other factors. Follow these instructions at home:  Have your blood pressure rechecked as directed by your health care provider.  Take medicines only as directed by your health care provider. Follow the directions carefully. Blood pressure medicines must be taken as prescribed. The medicine does not work as well when you skip doses. Skipping doses also puts you at risk for problems.  Do not smoke.  Monitor your blood pressure at home as directed by your health care provider. Contact a health care provider if:  You think you are having a reaction to medicines taken.  You have recurrent headaches or feel dizzy.  You have swelling in your ankles.  You have trouble with your vision. Get help right away if:  You develop a severe headache or confusion.  You have unusual weakness, numbness, or feel faint.  You have severe chest or abdominal pain.  You vomit repeatedly.  You have trouble breathing. This information is not intended to replace advice given to you by your health care provider. Make sure you discuss any questions you have with your health care provider. Document Released: 11/14/2005 Document Revised: 04/21/2016 Document Reviewed: 09/06/2013 Elsevier Interactive Patient Education  2017 [employer].

## 2016-10-28 NOTE — Assessment & Plan Note (Signed)
Right hip pain and stiffness in am. Encouraged to Tylenol and Lidocaine patches as needed and follow up with Dr Nelva Bush as needed

## 2016-10-28 NOTE — Progress Notes (Signed)
Pre visit review using our clinic review tool, if applicable. No additional management support is needed unless otherwise documented below in the visit note. 

## 2016-10-31 ENCOUNTER — Other Ambulatory Visit: Payer: Self-pay | Admitting: Family Medicine

## 2016-10-31 DIAGNOSIS — E875 Hyperkalemia: Secondary | ICD-10-CM

## 2016-11-01 ENCOUNTER — Telehealth: Payer: Self-pay | Admitting: Family Medicine

## 2016-11-01 ENCOUNTER — Encounter: Payer: Self-pay | Admitting: Family Medicine

## 2016-11-01 ENCOUNTER — Other Ambulatory Visit: Payer: Self-pay | Admitting: Family Medicine

## 2016-11-01 DIAGNOSIS — H6191 Disorder of right external ear, unspecified: Secondary | ICD-10-CM | POA: Insufficient documentation

## 2016-11-01 DIAGNOSIS — K625 Hemorrhage of anus and rectum: Secondary | ICD-10-CM

## 2016-11-01 HISTORY — DX: Hemorrhage of anus and rectum: K62.5

## 2016-11-01 NOTE — Telephone Encounter (Signed)
Informed the patient (his wife) that office notes have been faxed to [place] and they should be hearing soon regarding an apppintment

## 2016-11-01 NOTE — Assessment & Plan Note (Signed)
Avoid offending foods, start probiotics. Do not eat large meals in late evening and consider raising head of bed.  

## 2016-11-01 NOTE — Assessment & Plan Note (Signed)
Referred to dermatology for further consideration. Lesion has been present for months.

## 2016-11-01 NOTE — Assessment & Plan Note (Signed)
Only notes scant blood on tissue at times. Especially after loose, mucusy stool. Encouraged to take fiber supplements, probiotics and plenty of fluids. Cleanse with witch hazel astringent and to report if symptoms worsen. Will not proceed with further testing unless worsens

## 2016-11-01 NOTE — Assessment & Plan Note (Signed)
Tolerating statin, encouraged heart healthy diet, avoid trans fats, minimize simple carbs and saturated fats. Increase exercise as tolerated 

## 2016-11-01 NOTE — Progress Notes (Signed)
Patient ID: Daniel Reeves, male   DOB: Nov 11, 1924, 82 y.o.   MRN: [id number]   Subjective:    Patient ID: Daniel Reeves, male    DOB: 1924-10-06, 82 y.o.   MRN: [id number]  Chief Complaint  Patient presents with  . Follow-up    HPI Patient is in today for follow up accompanied by his wife. Over all he is doing well and has not had any recent hospitalizations. He has a couple of concerns. Notes a persistent lesion on right ear over several months. No bleeding is mildly irritated at times. Notes some pain in right posterior hip/buttock upon arising in morning. Does improve after being up for an hour or so. No radicular symptoms or incontinence. No fall or injury. Finally notes scant blood on tissue after BMs at times. No straining but does note loose and sometimes mucusy stool at times. Denies CP/palp/SOB/HA/congestion/fevers or GU c/o. Taking meds as prescribed  Past Medical History:  Diagnosis Date  . Abdominal aortic aneurysm ([place])    a. [place] (1/14):  3.3 x 3.4 cm => f/u 11/2013  . Amebic dysentery   . AMEBIC DYSENTERY 11/19/2007   Qualifier: History of  By: Lenna Gilford MD, Deborra Medina   . Anxiety   . ANXIETY 11/19/2007   Qualifier: Diagnosis of  By: Lenna Gilford MD, Deborra Medina   . Atherosclerosis of coronary artery bypass graft with unstable angina pectoris ([place]) 04/19/2013  . BACK PAIN, LUMBAR 11/16/2007   Qualifier: Diagnosis of  By: Julien Girt CMA, Leigh    . Benign prostatic hypertrophy   . BENIGN PROSTATIC HYPERTROPHY, HX OF 11/16/2007   Qualifier: Diagnosis of  By: Julien Girt CMA, Leigh    . BRBPR (bright red blood per rectum) 11/01/2016  . CAD (coronary artery disease)    a. s/p CABG in 1979 and 1993;  b. LHC (5/14):  LM, LAD, CFX and RCA occluded; L-LAD ok, dLAD occluded after insertion of LIMA, S-OM occluded, S-PDA/AM 80-90 => PCI with Promus DES; EF 25%  . Cardiomyopathy, ischemic 06/05/2013  . Cerumen impaction    Bilateral  . CERUMEN IMPACTION, BILATERAL 02/03/2010   Qualifier: History of  By: Lenna Gilford  MD, Deborra Medina   . Chicken pox as a child  . Chronic systolic CHF (congestive heart failure) ([place])   . COLONIC POLYPS 07/01/2008   Qualifier: Diagnosis of  By: Lenna Gilford MD, Deborra Medina   . Degenerative joint disease   . DEGENERATIVE JOINT DISEASE 11/16/2007   Qualifier: Diagnosis of  By: Julien Girt CMA, Marliss Czar    . Diverticulosis of colon   . DIVERTICULOSIS OF COLON 07/01/2008   Qualifier: Diagnosis of  By: Lenna Gilford MD, Deborra Medina   . Essential hypertension 11/16/2007   Qualifier: Diagnosis of  By: Julien Girt CMA, Marliss Czar    . FLANK PAIN, RIGHT 02/03/2010   Qualifier: History of  By: Lenna Gilford MD, Deborra Medina   . GERD 11/16/2007   Qualifier: Diagnosis of  By: Julien Girt CMA, Marliss Czar    . GERD (gastroesophageal reflux disease)   . Gout   . GOUT 11/16/2007   Qualifier: Diagnosis of  By: Julien Girt CMA, Marliss Czar    . Hearing loss 11/24/2014  . Heart murmur   . Hypercholesterolemia   . HYPERCHOLESTEROLEMIA 11/16/2007   Qualifier: Diagnosis of  By: Julien Girt CMA, Marliss Czar    . Hypertension   . Ischemic cardiomyopathy    a. echo (09/05/13): EF 35%, diffuse HK worsened distal septal, mid/distal inferior and apical region, grade 1 diastolic dysfunction, mild LAE.    [name]  Kidney stone 08/17/2011  . Loss of hearing   . Lumbar back pain   . Measles as a child  . Medicare annual wellness visit, subsequent 11/24/2014   Sees Dr Delman Cheadle for dermatology Sees Dr Roni Bread of Urology Sees Dr Stanford Breed of cardiology Sees Dr Virginia Rochester of Opthamology No further colonoscopies warranted       . Mumps as a child  . Nephrolithiasis   . Pain in joint, lower leg 07/29/2014  . PERIPHERAL VASCULAR DISEASE 11/16/2007   Qualifier: Diagnosis of  By: Julien Girt CMA, Marliss Czar    . Peripheral vascular disease ([place])   . Shingles 07/29/2014  . Sun-damaged skin 05/31/2014    Past Surgical History:  Procedure Laterality Date  . CORONARY ANGIOPLASTY WITH STENT PLACEMENT  04/18/2013   RCA       . CORONARY ARTERY BYPASS GRAFT  1979   x4 SVG-DIAG-LAD, SVG-OM-PDA  . CORONARY ARTERY BYPASS GRAFT   1993   Redo x5 by Dr Harlow Asa; [facility], SVG-OM, SVG-AM-PL  . Decompressive laminectomy  01/2006   L2 - scarum by Dr. Shellia Carwin  . HEMORRHOID SURGERY     fissure with hemorrhoid corrected at age 21  . INGUINAL HERNIA REPAIR  1994   Right by Dr Harlow Asa  . INGUINAL HERNIA REPAIR  1996   Left by Dr. Harlow Asa  . LEFT HEART CATHETERIZATION WITH CORONARY ANGIOGRAM N/A 09/23/2013   Procedure: LEFT HEART CATHETERIZATION WITH CORONARY ANGIOGRAM;  Surgeon: Blane Ohara, MD;  Location[facility] CATH LAB;  Service: Cardiovascular;  Laterality: N/A;  . PERCUTANEOUS CORONARY STENT INTERVENTION (PCI-S) N/A 04/18/2013   Procedure: PERCUTANEOUS CORONARY STENT INTERVENTION (PCI-S);  Surgeon: Sherren Mocha, MD;  Location: [facility] CATH LAB;  Service: Cardiovascular;  Laterality: N/A;  . TONSILLECTOMY      Family History  Problem Relation Age of Onset  . Parkinsonism Brother   . Diabetes Maternal Grandmother   . Depression Daughter   . Other Son     4 stents  . Heart disease Son   . Diabetes Son     type 2    Social History   Social History  . Marital status: Married    Spouse name: [name] x 64 yrs  . Number of children: N/A  . Years of education: N/A   Occupational History  . 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 [employer]  . Smoking status: Former Smoker    Quit date: 11/28/1944  . Smokeless tobacco: Never Used  . Alcohol use 7.0 oz/week    14 Standard drinks or equivalent per week     Comment: daily rum  or wine  . Drug use: No  . Sexual activity: Not Currently     Comment: lives with wife, no dietary restrictions.    Other Topics Concern  . Not on file   Social History Narrative   Married   7 children    Outpatient Medications Prior to Visit  Medication Sig Dispense Refill  . aspirin EC 81 MG tablet Take 81 mg by mouth every morning.     . B Complex-C (B-COMPLEX WITH VITAMIN C) tablet Take 1 tablet by mouth daily.    . Cholecalciferol (VITAMIN D-3 PO)  Take 5,000 Units by mouth daily with breakfast.     . clopidogrel (PLAVIX) 75 MG tablet Take 1 tablet (75 mg total) by mouth daily. 90 tablet 1  . finasteride (PROSCAR) 5 MG tablet Takes every third day    . folic acid (FOLVITE)  400 MCG tablet Take 400 mcg by mouth 2 (two) times daily.     . hydrocortisone (ANUSOL-HC) 25 MG suppository Place 1 suppository (25 mg total) rectally 2 (two) times daily. 12 suppository 0  . isosorbide mononitrate (IMDUR) 60 MG 24 hr tablet Take 1 tablet (60 mg total) by mouth daily. 90 tablet 3  . losartan (COZAAR) 50 MG tablet Take 1 tablet (50 mg total) by mouth daily. 90 tablet 3  . metoprolol succinate (TOPROL-XL) 25 MG 24 hr tablet Take 0.5 tablets (12.5 mg total) by mouth daily. 45 tablet 1  . Misc Natural Products (OSTEO BI-FLEX ADV JOINT SHIELD) TABS Take 1 tablet by mouth 2 (two) times daily.     . nitroGLYCERIN (NITROSTAT) 0.4 MG SL tablet Place 1 tablet (0.4 mg total) under the tongue every 5 (five) minutes as needed. For chest pain. 25 tablet 6  . simvastatin (ZOCOR) 40 MG tablet TAKE 1 BY MOUTH EVERY EVENING 90 tablet 3  . allopurinol (ZYLOPRIM) 300 MG tablet TAKE 1 TABLET(300 MG) BY MOUTH DAILY WITH BREAKFAST 90 tablet 0   No facility-administered medications prior to visit.     Allergies  Allergen Reactions  . Lisinopril     REACTION: dizziness  . Methocarbamol     REACTION: pt states "dizzy"  . Pregabalin     REACTION: pt states "dizzy"  . Ramipril     REACTION: hives and dizziness    Review of Systems  Constitutional: Negative for fever and malaise/fatigue.  HENT: Negative for congestion.   Eyes: Negative for blurred vision.  Respiratory: Negative for shortness of breath.   Cardiovascular: Negative for chest pain, palpitations and leg swelling.  Gastrointestinal: Positive for blood in stool. Negative for abdominal pain, melena and nausea.  Genitourinary: Negative for dysuria and frequency.  Musculoskeletal: Negative for falls.  Skin:  Negative for rash.       Lesion on right ear, occasionally irritated or itchy  Neurological: Negative for dizziness, loss of consciousness and headaches.  Endo/Heme/Allergies: Negative for environmental allergies.  Psychiatric/Behavioral: Negative for depression. The patient is not nervous/anxious.        Objective:    Physical Exam  Constitutional: He is oriented to person, place, and time. He appears well-developed and well-nourished. No distress.  HENT:  Head: Normocephalic and atraumatic.  Nose: Nose normal.  Eyes: Right eye exhibits no discharge. Left eye exhibits no discharge.  Neck: Normal range of motion. Neck supple.  Cardiovascular: Normal rate and regular rhythm.   Murmur heard. Pulmonary/Chest: Effort normal and breath sounds normal.  Abdominal: Soft. Bowel sounds are normal. He exhibits no mass. There is no tenderness. There is no rebound and no guarding.  Musculoskeletal: He exhibits no edema.  Neurological: He is alert and oriented to person, place, and time.  Skin: Skin is warm and dry.  Psychiatric: He has a normal mood and affect.  Nursing note and vitals reviewed.   BP 140/62 (BP Location: Right Arm, Patient Position: Sitting, Cuff Size: Normal)   Pulse (!) 51   Temp 98.4 F (36.9 C) (Oral)   Ht 6' (1.829 m)   Wt 175 lb (79.4 kg)   SpO2 97%   BMI 23.73 kg/m  Wt Readings from Last 3 Encounters:  10/28/16 175 lb (79.4 kg)  07/05/16 171 lb 2 oz (77.6 kg)  07/01/16 170 lb 12.8 oz (77.5 kg)     Lab Results  Component Value Date   WBC 7.5 10/28/2016   HGB 13.1 10/28/2016  HCT 38.5 (L) 10/28/2016   PLT 211.0 10/28/2016   GLUCOSE 95 10/28/2016   CHOL 99 10/28/2016   TRIG 73.0 10/28/2016   HDL 36.30 (L) 10/28/2016   LDLCALC 48 10/28/2016   ALT 19 10/28/2016   AST 26 10/28/2016   NA 142 10/28/2016   K 5.2 (H) 10/28/2016   CL 109 10/28/2016   CREATININE 1.01 10/28/2016   BUN 20 10/28/2016   CO2 27 10/28/2016   TSH 2.75 10/28/2016   PSA 4.29 (H)  02/08/2011   INR 1.0 09/17/2013    Lab Results  Component Value Date   TSH 2.75 10/28/2016   Lab Results  Component Value Date   WBC 7.5 10/28/2016   HGB 13.1 10/28/2016   HCT 38.5 (L) 10/28/2016   MCV 97.2 10/28/2016   PLT 211.0 10/28/2016   Lab Results  Component Value Date   NA 142 10/28/2016   K 5.2 (H) 10/28/2016   CO2 27 10/28/2016   GLUCOSE 95 10/28/2016   BUN 20 10/28/2016   CREATININE 1.01 10/28/2016   BILITOT 0.8 10/28/2016   ALKPHOS 63 10/28/2016   AST 26 10/28/2016   ALT 19 10/28/2016   PROT 6.8 10/28/2016   ALBUMIN 3.9 10/28/2016   CALCIUM 9.4 10/28/2016   GFR 73.67 10/28/2016   Lab Results  Component Value Date   CHOL 99 10/28/2016   Lab Results  Component Value Date   HDL 36.30 (L) 10/28/2016   Lab Results  Component Value Date   LDLCALC 48 10/28/2016   Lab Results  Component Value Date   TRIG 73.0 10/28/2016   Lab Results  Component Value Date   CHOLHDL 3 10/28/2016   No results found for: HGBA1C     Assessment & Plan:   Problem List Items Addressed This Visit    HYPERCHOLESTEROLEMIA    Tolerating statin, encouraged heart healthy diet, avoid trans fats, minimize simple carbs and saturated fats. Increase exercise as tolerated      Relevant Orders   Lipid panel (Completed)   Gout   Relevant Orders   Uric acid (Completed)   Essential hypertension    Well controlled, no changes to meds. Encouraged heart healthy diet such as the DASH diet and exercise as tolerated.       Relevant Orders   TSH (Completed)   GERD    Avoid offending foods, start probiotics. Do not eat large meals in late evening and consider raising head of bed.       BACK PAIN, LUMBAR    Pain noted in posterior right hip/buttock worse in am and with movement. Suspect sacroiliac dysfunction vs DDD. Encouraged to stay as active as tolerated and to try topical heat and Lidocaine prn. Report worsening symptoms. Pain usually improves after being up      CAD  (coronary artery disease)   Atherosclerosis of coronary artery bypass graft with unstable angina pectoris ([place])   Relevant Orders   CBC (Completed)   Comprehensive metabolic panel (Completed)   TSH (Completed)   Pain in joint, lower leg    Right hip pain and stiffness in am. Encouraged to Tylenol and Lidocaine patches as needed and follow up with Dr Nelva Bush as needed      Skin lesion of right ear - Primary    Referred to dermatology for further consideration. Lesion has been present for months.       Relevant Orders   Ambulatory referral to Dermatology   BRBPR (bright red blood per rectum)  Only notes scant blood on tissue at times. Especially after loose, mucusy stool. Encouraged to take fiber supplements, probiotics and plenty of fluids. Cleanse with witch hazel astringent and to report if symptoms worsen. Will not proceed with further testing unless worsens         I am having Mr. Kick maintain his folic acid, OSTEO BI-FLEX ADV JOINT SHIELD, Cholecalciferol (VITAMIN D-3 PO), aspirin EC, finasteride, B-complex with vitamin C, nitroGLYCERIN, isosorbide mononitrate, losartan, simvastatin, metoprolol succinate, hydrocortisone, and clopidogrel.  No orders of the defined types were placed in this encounter.    Penni Homans, MD

## 2016-11-01 NOTE — Assessment & Plan Note (Signed)
Pain noted in posterior right hip/buttock worse in am and with movement. Suspect sacroiliac dysfunction vs DDD. Encouraged to stay as active as tolerated and to try topical heat and Lidocaine prn. Report worsening symptoms. Pain usually improves after being up

## 2016-11-01 NOTE — Assessment & Plan Note (Signed)
Well controlled, no changes to meds. Encouraged heart healthy diet such as the DASH diet and exercise as tolerated.  °

## 2016-11-01 NOTE — Telephone Encounter (Signed)
Patients ear is starting to cause him some discomfort and really needs that referral. [facility] stated PCP needs to complete note before they can send referral.

## 2016-11-01 NOTE — Telephone Encounter (Signed)
Patient called stating that he would like to speak with Shirlean Mylar, he would not give me any further information. He would like a return call.   Patient phone: [phone]

## 2016-11-03 ENCOUNTER — Encounter: Payer: Self-pay | Admitting: Family Medicine

## 2016-11-04 ENCOUNTER — Other Ambulatory Visit (INDEPENDENT_AMBULATORY_CARE_PROVIDER_SITE_OTHER): Payer: Medicare Other

## 2016-11-04 DIAGNOSIS — E875 Hyperkalemia: Secondary | ICD-10-CM

## 2016-11-04 LAB — COMPREHENSIVE METABOLIC PANEL
ALT: 24 U/L (ref 0–53)
AST: 28 U/L (ref 0–37)
Albumin: 4.1 g/dL (ref 3.5–5.2)
Alkaline Phosphatase: 60 U/L (ref 39–117)
BUN: 22 mg/dL (ref 6–23)
CHLORIDE: 106 meq/L (ref 96–112)
CO2: 28 meq/L (ref 19–32)
Calcium: 9.4 mg/dL (ref 8.4–10.5)
Creatinine, Ser: 1.07 mg/dL (ref 0.40–1.50)
GFR: 68.92 mL/min (ref >60.00)
GLUCOSE: 94 mg/dL (ref 70–99)
POTASSIUM: 4.2 meq/L (ref 3.5–5.1)
Sodium: 139 mEq/L (ref 135–145)
Total Bilirubin: 0.6 mg/dL (ref 0.2–1.2)
Total Protein: 6.9 g/dL (ref 6.0–8.3)

## 2016-11-07 DIAGNOSIS — M9902 Segmental and somatic dysfunction of thoracic region: Secondary | ICD-10-CM | POA: Diagnosis not present

## 2016-11-07 DIAGNOSIS — M9904 Segmental and somatic dysfunction of sacral region: Secondary | ICD-10-CM | POA: Diagnosis not present

## 2016-11-07 DIAGNOSIS — M9901 Segmental and somatic dysfunction of cervical region: Secondary | ICD-10-CM | POA: Diagnosis not present

## 2016-11-07 DIAGNOSIS — M5431 Sciatica, right side: Secondary | ICD-10-CM | POA: Diagnosis not present

## 2016-11-18 DIAGNOSIS — C44222 Squamous cell carcinoma of skin of right ear and external auricular canal: Secondary | ICD-10-CM | POA: Diagnosis not present

## 2016-11-18 DIAGNOSIS — L570 Actinic keratosis: Secondary | ICD-10-CM | POA: Diagnosis not present

## 2016-11-18 DIAGNOSIS — D492 Neoplasm of unspecified behavior of bone, soft tissue, and skin: Secondary | ICD-10-CM | POA: Diagnosis not present

## 2016-11-23 DIAGNOSIS — M5431 Sciatica, right side: Secondary | ICD-10-CM | POA: Diagnosis not present

## 2016-11-23 DIAGNOSIS — M9902 Segmental and somatic dysfunction of thoracic region: Secondary | ICD-10-CM | POA: Diagnosis not present

## 2016-11-23 DIAGNOSIS — M9901 Segmental and somatic dysfunction of cervical region: Secondary | ICD-10-CM | POA: Diagnosis not present

## 2016-11-23 DIAGNOSIS — M9904 Segmental and somatic dysfunction of sacral region: Secondary | ICD-10-CM | POA: Diagnosis not present

## 2016-11-29 ENCOUNTER — Other Ambulatory Visit: Payer: Self-pay | Admitting: Family Medicine

## 2016-11-29 ENCOUNTER — Other Ambulatory Visit: Payer: Self-pay

## 2016-11-29 DIAGNOSIS — I10 Essential (primary) hypertension: Secondary | ICD-10-CM

## 2016-11-29 DIAGNOSIS — I25700 Atherosclerosis of coronary artery bypass graft(s), unspecified, with unstable angina pectoris: Secondary | ICD-10-CM

## 2016-11-29 DIAGNOSIS — E7800 Pure hypercholesterolemia, unspecified: Secondary | ICD-10-CM

## 2016-11-29 MED ORDER — ISOSORBIDE MONONITRATE ER 60 MG PO TB24: 60.0000 mg | ORAL_TABLET | Freq: Every day | ORAL | 0 refills | Status: DC

## 2016-11-29 MED ORDER — METOPROLOL SUCCINATE ER 25 MG PO TB24: 12.5000 mg | ORAL_TABLET | Freq: Every day | ORAL | 0 refills | Status: DC

## 2016-11-29 MED ORDER — LOSARTAN POTASSIUM 50 MG PO TABS: 50.0000 mg | ORAL_TABLET | Freq: Every day | ORAL | 0 refills | Status: DC

## 2016-11-29 MED ORDER — CLOPIDOGREL BISULFATE 75 MG PO TABS: 75.0000 mg | ORAL_TABLET | Freq: Every day | ORAL | 0 refills | Status: DC

## 2016-11-29 MED ORDER — SIMVASTATIN 40 MG PO TABS: ORAL_TABLET | ORAL | 3 refills | Status: DC

## 2016-12-05 DIAGNOSIS — M5431 Sciatica, right side: Secondary | ICD-10-CM | POA: Diagnosis not present

## 2016-12-05 DIAGNOSIS — M9902 Segmental and somatic dysfunction of thoracic region: Secondary | ICD-10-CM | POA: Diagnosis not present

## 2016-12-05 DIAGNOSIS — M9901 Segmental and somatic dysfunction of cervical region: Secondary | ICD-10-CM | POA: Diagnosis not present

## 2016-12-05 DIAGNOSIS — M9904 Segmental and somatic dysfunction of sacral region: Secondary | ICD-10-CM | POA: Diagnosis not present

## 2016-12-10 ENCOUNTER — Encounter: Payer: Self-pay | Admitting: Family Medicine

## 2016-12-12 ENCOUNTER — Other Ambulatory Visit: Payer: Self-pay | Admitting: Family Medicine

## 2016-12-12 ENCOUNTER — Encounter: Payer: Self-pay | Admitting: Family Medicine

## 2016-12-12 DIAGNOSIS — C4490 Unspecified malignant neoplasm of skin, unspecified: Secondary | ICD-10-CM

## 2016-12-19 DIAGNOSIS — M545 Low back pain: Secondary | ICD-10-CM | POA: Diagnosis not present

## 2016-12-19 DIAGNOSIS — H6191 Disorder of right external ear, unspecified: Secondary | ICD-10-CM | POA: Diagnosis not present

## 2016-12-19 DIAGNOSIS — M9901 Segmental and somatic dysfunction of cervical region: Secondary | ICD-10-CM | POA: Diagnosis not present

## 2016-12-19 DIAGNOSIS — M9902 Segmental and somatic dysfunction of thoracic region: Secondary | ICD-10-CM | POA: Diagnosis not present

## 2016-12-19 DIAGNOSIS — M9904 Segmental and somatic dysfunction of sacral region: Secondary | ICD-10-CM | POA: Diagnosis not present

## 2016-12-20 ENCOUNTER — Encounter: Payer: Self-pay | Admitting: Family Medicine

## 2017-01-02 DIAGNOSIS — M9904 Segmental and somatic dysfunction of sacral region: Secondary | ICD-10-CM | POA: Diagnosis not present

## 2017-01-02 DIAGNOSIS — M9902 Segmental and somatic dysfunction of thoracic region: Secondary | ICD-10-CM | POA: Diagnosis not present

## 2017-01-02 DIAGNOSIS — M9901 Segmental and somatic dysfunction of cervical region: Secondary | ICD-10-CM | POA: Diagnosis not present

## 2017-01-02 DIAGNOSIS — M545 Low back pain: Secondary | ICD-10-CM | POA: Diagnosis not present

## 2017-01-04 ENCOUNTER — Encounter: Payer: Self-pay | Admitting: Family Medicine

## 2017-01-04 NOTE — Progress Notes (Signed)
HPI: FU CAD; s/p CABG in 1979 and 1993, ischemic CM, systolic CHF, AAA, HTN, HL. Patient underwent cardiac catheterization in May of 2014. The left main, LAD, circumflex and RCA were occluded. The LIMA to the LAD was patent and the distal LAD was occluded after the insertion. Saphenous vein graft to the obtuse marginal was occluded. Saphenous vein graft to the acute marginal and PDA had a high-grade lesion prior to insertion into the PDA of 80-90%. Ejection fraction was 25%. PCI: Promus Premier (3.5x12 mm) DES to the [facility]. Echo (09/05/13): EF 35%, diffuse HK worsened distal septal, mid/distal inferior and apical region, grade 1 diastolic dysfunction, mild LAE. Patient had repeat catheterization in October 2014 because of recurrent chest pain. The stent placed in the saphenous vein graft to the PDA had mild in-stent restenosis. Medical therapy recommended. Abdominal ultrasound 2/17 showed an abdominal aortic aneurysm of 3.5 x 3.5 cm. Nuclear study 2/17 showed EF 35, inferolateral scar, no ischemia. Since he was last seen, patient denies dyspnea, chest pain, palpitations or syncope. He has had fatigue. 8 days ago he got up in the middle of the night to urinate. He had transient fuzziness with his vision but no other neurological symptoms.  Current Outpatient Prescriptions  Medication Sig Dispense Refill  . allopurinol (ZYLOPRIM) 300 MG tablet TAKE 1 TABLET(300 MG) BY MOUTH DAILY WITH BREAKFAST 90 tablet 0  . aspirin EC 81 MG tablet Take 81 mg by mouth every morning.     . B Complex-C (B-COMPLEX WITH VITAMIN C) tablet Take 1 tablet by mouth daily.    . Cholecalciferol (VITAMIN D-3 PO) Take 5,000 Units by mouth daily with breakfast.     . clopidogrel (PLAVIX) 75 MG tablet Take 1 tablet (75 mg total) by mouth daily. PLEASE MAKE APPOINTMENT FOR FURTHER REFILLS 90 tablet 0  . finasteride (PROSCAR) 5 MG tablet Takes every third day    . folic acid (FOLVITE) A999333 MCG tablet Take 400 mcg by mouth 2 (two)  times daily.     . hydrocortisone (ANUSOL-HC) 25 MG suppository Place 1 suppository (25 mg total) rectally 2 (two) times daily. 12 suppository 0  . isosorbide mononitrate (IMDUR) 60 MG 24 hr tablet Take 1 tablet (60 mg total) by mouth daily. 30 tablet 0  . losartan (COZAAR) 50 MG tablet Take 1 tablet (50 mg total) by mouth daily. 90 tablet 0  . metoprolol succinate (TOPROL-XL) 25 MG 24 hr tablet Take 0.5 tablets (12.5 mg total) by mouth daily. 15 tablet 0  . Misc Natural Products (OSTEO BI-FLEX ADV JOINT SHIELD) TABS Take 1 tablet by mouth 2 (two) times daily.     . nitroGLYCERIN (NITROSTAT) 0.4 MG SL tablet Place 1 tablet (0.4 mg total) under the tongue every 5 (five) minutes as needed. For chest pain. 25 tablet 6  . simvastatin (ZOCOR) 40 MG tablet TAKE 1 BY MOUTH EVERY EVENING 90 tablet 3   No current facility-administered medications for this visit.      Past Medical History:  Diagnosis Date  . Abdominal aortic aneurysm ([place])    a. [place] (1/14):  3.3 x 3.4 cm => f/u 11/2013  . Amebic dysentery   . AMEBIC DYSENTERY 11/19/2007   Qualifier: History of  By: Lenna Gilford MD, Deborra Medina   . Anxiety   . ANXIETY 11/19/2007   Qualifier: Diagnosis of  By: Lenna Gilford MD, Deborra Medina   . Atherosclerosis of coronary artery bypass graft with unstable angina pectoris ([place]) 04/19/2013  .  BACK PAIN, LUMBAR 11/16/2007   Qualifier: Diagnosis of  By: Julien Girt CMA, Leigh    . Benign prostatic hypertrophy   . BENIGN PROSTATIC HYPERTROPHY, HX OF 11/16/2007   Qualifier: Diagnosis of  By: Julien Girt CMA, Leigh    . BRBPR (bright red blood per rectum) 11/01/2016  . CAD (coronary artery disease)    a. s/p CABG in 1979 and 1993;  b. LHC (5/14):  LM, LAD, CFX and RCA occluded; L-LAD ok, dLAD occluded after insertion of LIMA, S-OM occluded, S-PDA/AM 80-90 => PCI with Promus DES; EF 25%  . Cardiomyopathy, ischemic 06/05/2013  . Cerumen impaction    Bilateral  . CERUMEN IMPACTION, BILATERAL 02/03/2010   Qualifier: History of  By: Lenna Gilford MD,  Deborra Medina   . Chicken pox as a child  . Chronic systolic CHF (congestive heart failure) ([place])   . COLONIC POLYPS 07/01/2008   Qualifier: Diagnosis of  By: Lenna Gilford MD, Deborra Medina   . Degenerative joint disease   . DEGENERATIVE JOINT DISEASE 11/16/2007   Qualifier: Diagnosis of  By: Julien Girt CMA, Marliss Czar    . Diverticulosis of colon   . DIVERTICULOSIS OF COLON 07/01/2008   Qualifier: Diagnosis of  By: Lenna Gilford MD, Deborra Medina   . Essential hypertension 11/16/2007   Qualifier: Diagnosis of  By: Julien Girt CMA, Marliss Czar    . FLANK PAIN, RIGHT 02/03/2010   Qualifier: History of  By: Lenna Gilford MD, Deborra Medina   . GERD 11/16/2007   Qualifier: Diagnosis of  By: Julien Girt CMA, Marliss Czar    . GERD (gastroesophageal reflux disease)   . Gout   . GOUT 11/16/2007   Qualifier: Diagnosis of  By: Julien Girt CMA, Marliss Czar    . Hearing loss 11/24/2014  . Heart murmur   . Hypercholesterolemia   . HYPERCHOLESTEROLEMIA 11/16/2007   Qualifier: Diagnosis of  By: Julien Girt CMA, Marliss Czar    . Hypertension   . Ischemic cardiomyopathy    a. echo (09/05/13): EF 35%, diffuse HK worsened distal septal, mid/distal inferior and apical region, grade 1 diastolic dysfunction, mild LAE.    [name] Kidney stone 08/17/2011  . Loss of hearing   . Lumbar back pain   . Measles as a child  . Medicare annual wellness visit, subsequent 11/24/2014   Sees Dr Delman Cheadle for dermatology Sees Dr Roni Bread of Urology Sees Dr Stanford Breed of cardiology Sees Dr Virginia Rochester of Opthamology No further colonoscopies warranted       . Mumps as a child  . Nephrolithiasis   . Pain in joint, lower leg 07/29/2014  . PERIPHERAL VASCULAR DISEASE 11/16/2007   Qualifier: Diagnosis of  By: Julien Girt CMA, Marliss Czar    . Peripheral vascular disease ([place])   . Shingles 07/29/2014  . Sun-damaged skin 05/31/2014    Past Surgical History:  Procedure Laterality Date  . CORONARY ANGIOPLASTY WITH STENT PLACEMENT  04/18/2013   RCA       . CORONARY ARTERY BYPASS GRAFT  1979   x4 SVG-DIAG-LAD, SVG-OM-PDA  . CORONARY ARTERY BYPASS GRAFT  1993     Redo x5 by Dr Harlow Asa; [facility], SVG-OM, SVG-AM-PL  . Decompressive laminectomy  01/2006   L2 - scarum by Dr. Shellia Carwin  . HEMORRHOID SURGERY     fissure with hemorrhoid corrected at age 28  . INGUINAL HERNIA REPAIR  1994   Right by Dr Harlow Asa  . INGUINAL HERNIA REPAIR  1996   Left by Dr. Harlow Asa  . LEFT HEART CATHETERIZATION WITH CORONARY ANGIOGRAM N/A 09/23/2013   Procedure: LEFT HEART CATHETERIZATION WITH CORONARY  Cyril Loosen;  Surgeon: Blane Ohara, MD;  Location: [facility] CATH LAB;  Service: Cardiovascular;  Laterality: N/A;  . PERCUTANEOUS CORONARY STENT INTERVENTION (PCI-S) N/A 04/18/2013   Procedure: PERCUTANEOUS CORONARY STENT INTERVENTION (PCI-S);  Surgeon: Sherren Mocha, MD;  Location: [facility] CATH LAB;  Service: Cardiovascular;  Laterality: N/A;  . TONSILLECTOMY      Social History   Social History  . Marital status: Married    Spouse name: [name] x 64 yrs  . Number of children: N/A  . Years of education: N/A   Occupational History  . 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 Smoking status: Former Smoker    Quit date: 11/28/1944  . Smokeless tobacco: Never Used  . Alcohol use 7.0 oz/week    14 Standard drinks or equivalent per week     Comment: daily rum  or wine  . Drug use: No  . Sexual activity: Not Currently     Comment: lives with wife, no dietary restrictions.    Other Topics Concern  . Not on file   Social History Narrative   Married   7 children    Family History  Problem Relation Age of Onset  . Parkinsonism Brother   . Diabetes Maternal Grandmother   . Depression Daughter   . Other Son     4 stents  . Heart disease Son   . Diabetes Son     type 2    ROS: no fevers or chills, productive cough, hemoptysis, dysphasia, odynophagia, melena, hematochezia, dysuria, hematuria, rash, seizure activity, orthopnea, PND, pedal edema, claudication. Remaining systems are negative.  Physical Exam: Well-developed  well-nourished in no acute distress.  Skin is warm and dry.  HEENT is normal.  Neck is supple. No bruits Chest is clear to auscultation with normal expansion.  Cardiovascular exam is regular rate and rhythm.  Abdominal exam nontender or distended. No masses palpated. Extremities show no edema. neuro grossly intact  ECG-Sinus bradycardia, first-degree AV block, left bundle branch block.  A/P  1 Coronary artery disease-continue aspirin, statin and nitrates. Patient is having occasional nosebleeds. Discontinue Plavix.   2 hypertension-blood pressure controlled. Continue present medications.  3 hyperlipidemia-continue statin.  4 ischemic cardiomyopathy-continue ARB and beta blocker.  5 abdominal aortic aneurysm-plan follow-up abdominal ultrasound.  6 dizziness-etiology unclear. Doubt TIA but will repeat echocardiogram and carotid Dopplers.  Kirk Ruths, MD

## 2017-01-05 DIAGNOSIS — C44212 Basal cell carcinoma of skin of right ear and external auricular canal: Secondary | ICD-10-CM | POA: Diagnosis not present

## 2017-01-09 ENCOUNTER — Encounter: Payer: Self-pay | Admitting: Cardiology

## 2017-01-11 ENCOUNTER — Encounter: Payer: Self-pay | Admitting: Cardiology

## 2017-01-11 ENCOUNTER — Other Ambulatory Visit: Payer: Self-pay | Admitting: *Deleted

## 2017-01-11 ENCOUNTER — Ambulatory Visit (INDEPENDENT_AMBULATORY_CARE_PROVIDER_SITE_OTHER): Payer: Medicare Other | Admitting: Cardiology

## 2017-01-11 ENCOUNTER — Ambulatory Visit (HOSPITAL_BASED_OUTPATIENT_CLINIC_OR_DEPARTMENT_OTHER)
Admission: RE | Admit: 2017-01-11 | Discharge: 2017-01-11 | Disposition: A | Discharge status: Home / Self Care | Payer: Medicare Other | Source: Ambulatory Visit | Attending: Cardiology | Admitting: Cardiology

## 2017-01-11 VITALS — BP 130/58 | HR 52 | Ht 72.0 in | Wt 176.0 lb

## 2017-01-11 DIAGNOSIS — I679 Cerebrovascular disease, unspecified: Secondary | ICD-10-CM

## 2017-01-11 DIAGNOSIS — I25700 Atherosclerosis of coronary artery bypass graft(s), unspecified, with unstable angina pectoris: Secondary | ICD-10-CM

## 2017-01-11 DIAGNOSIS — I714 Abdominal aortic aneurysm, without rupture, unspecified: Secondary | ICD-10-CM

## 2017-01-11 DIAGNOSIS — E7800 Pure hypercholesterolemia, unspecified: Secondary | ICD-10-CM | POA: Diagnosis not present

## 2017-01-11 DIAGNOSIS — I255 Ischemic cardiomyopathy: Secondary | ICD-10-CM | POA: Diagnosis not present

## 2017-01-11 DIAGNOSIS — I6523 Occlusion and stenosis of bilateral carotid arteries: Secondary | ICD-10-CM | POA: Insufficient documentation

## 2017-01-11 DIAGNOSIS — I10 Essential (primary) hypertension: Secondary | ICD-10-CM

## 2017-01-11 DIAGNOSIS — I200 Unstable angina: Secondary | ICD-10-CM

## 2017-01-11 MED ORDER — SIMVASTATIN 40 MG PO TABS: ORAL_TABLET | ORAL | 3 refills | Status: DC

## 2017-01-11 MED ORDER — METOPROLOL SUCCINATE ER 25 MG PO TB24: 12.5000 mg | ORAL_TABLET | Freq: Every day | ORAL | 3 refills | Status: DC

## 2017-01-11 MED ORDER — ISOSORBIDE MONONITRATE ER 60 MG PO TB24
60.0000 mg | ORAL_TABLET | Freq: Every day | ORAL | 3 refills | Status: DC
Start: 2017-01-11 — End: 2017-03-22

## 2017-01-11 MED ORDER — LOSARTAN POTASSIUM 50 MG PO TABS: 50.0000 mg | ORAL_TABLET | Freq: Every day | ORAL | 3 refills | Status: DC

## 2017-01-11 MED FILL — ISOSORBIDE MN ER 60 MG TAB: 60 | 90 days supply | Qty: 90 | Fill #0

## 2017-01-11 NOTE — Patient Instructions (Signed)
Medication Instructions:   STOP PLAVIX  Testing/Procedures:  Your physician has requested that you have an echocardiogram. Echocardiography is a painless test that uses sound waves to create images of your heart. It provides your doctor with information about the size and shape of your heart and how well your heart's chambers and valves are working. This procedure takes approximately one hour. There are no restrictions for this procedure.   Your physician has requested that you have a carotid duplex. This test is an ultrasound of the carotid arteries in your neck. It looks at blood flow through these arteries that supply the brain with blood. Allow one hour for this exam. There are no restrictions or special instructions.   Your physician has requested that you have an abdominal aorta duplex. During this test, an ultrasound is used to evaluate the aorta. Allow 30 minutes for this exam. Do not eat after midnight the day before and avoid carbonated beverages   Follow-Up:  Your physician wants you to follow-up in: [place] will receive a reminder letter in the mail two months in advance. If you don't receive a letter, please call our office to schedule the follow-up appointment.   If you need a refill on your cardiac medications before your next appointment, please call your pharmacy.

## 2017-01-12 ENCOUNTER — Ambulatory Visit (HOSPITAL_BASED_OUTPATIENT_CLINIC_OR_DEPARTMENT_OTHER)
Admission: RE | Admit: 2017-01-12 | Discharge: 2017-01-12 | Disposition: A | Discharge status: Home / Self Care | Payer: Medicare Other | Source: Ambulatory Visit | Attending: Cardiology | Admitting: Cardiology

## 2017-01-12 ENCOUNTER — Ambulatory Visit (HOSPITAL_BASED_OUTPATIENT_CLINIC_OR_DEPARTMENT_OTHER): Payer: Medicare Other

## 2017-01-12 DIAGNOSIS — I714 Abdominal aortic aneurysm, without rupture, unspecified: Secondary | ICD-10-CM

## 2017-01-12 DIAGNOSIS — Z136 Encounter for screening for cardiovascular disorders: Secondary | ICD-10-CM | POA: Diagnosis not present

## 2017-01-12 DIAGNOSIS — I7409 Other arterial embolism and thrombosis of abdominal aorta: Secondary | ICD-10-CM | POA: Diagnosis not present

## 2017-01-12 DIAGNOSIS — Z87891 Personal history of nicotine dependence: Secondary | ICD-10-CM | POA: Diagnosis not present

## 2017-01-16 DIAGNOSIS — M9901 Segmental and somatic dysfunction of cervical region: Secondary | ICD-10-CM | POA: Diagnosis not present

## 2017-01-16 DIAGNOSIS — M9902 Segmental and somatic dysfunction of thoracic region: Secondary | ICD-10-CM | POA: Diagnosis not present

## 2017-01-16 DIAGNOSIS — M9904 Segmental and somatic dysfunction of sacral region: Secondary | ICD-10-CM | POA: Diagnosis not present

## 2017-01-16 DIAGNOSIS — M545 Low back pain: Secondary | ICD-10-CM | POA: Diagnosis not present

## 2017-01-16 MED FILL — METOPROLOL SUCC ER 25 MG TA: 25 | 90 days supply | Qty: 45 | Fill #0 | Status: TO

## 2017-01-18 ENCOUNTER — Ambulatory Visit (HOSPITAL_BASED_OUTPATIENT_CLINIC_OR_DEPARTMENT_OTHER)
Admission: RE | Admit: 2017-01-18 | Discharge: 2017-01-18 | Disposition: A | Discharge status: Home / Self Care | Payer: Medicare Other | Source: Ambulatory Visit | Attending: Cardiology | Admitting: Cardiology

## 2017-01-18 ENCOUNTER — Telehealth: Payer: Self-pay | Admitting: Family Medicine

## 2017-01-18 ENCOUNTER — Ambulatory Visit (HOSPITAL_BASED_OUTPATIENT_CLINIC_OR_DEPARTMENT_OTHER): Payer: Medicare Other

## 2017-01-18 DIAGNOSIS — I2510 Atherosclerotic heart disease of native coronary artery without angina pectoris: Secondary | ICD-10-CM | POA: Insufficient documentation

## 2017-01-18 DIAGNOSIS — I10 Essential (primary) hypertension: Secondary | ICD-10-CM | POA: Diagnosis not present

## 2017-01-18 DIAGNOSIS — I083 Combined rheumatic disorders of mitral, aortic and tricuspid valves: Secondary | ICD-10-CM | POA: Diagnosis not present

## 2017-01-18 DIAGNOSIS — I255 Ischemic cardiomyopathy: Secondary | ICD-10-CM | POA: Diagnosis not present

## 2017-01-18 NOTE — Progress Notes (Signed)
  Echocardiogram 2D Echocardiogram has been performed.  Aggie Cosier 01/18/2017, 11:54 AM

## 2017-01-18 NOTE — Telephone Encounter (Signed)
Per Dr. Nani Ravens, take 1/2 tablet of Claritin daily. Patient was made aware of the provider's recommendations. He verbalized understanding and did not have any further questions or concerns prior to call ending.

## 2017-01-18 NOTE — Telephone Encounter (Signed)
°  Relation to [id number] Call back number:[phone]   Reason for call:  Patient experiencing runny nose for 3x days no fever requesting clinical advice regarding OTC, please advise

## 2017-01-28 ENCOUNTER — Other Ambulatory Visit: Payer: Self-pay | Admitting: Family Medicine

## 2017-01-30 DIAGNOSIS — M9904 Segmental and somatic dysfunction of sacral region: Secondary | ICD-10-CM | POA: Diagnosis not present

## 2017-01-30 DIAGNOSIS — M545 Low back pain: Secondary | ICD-10-CM | POA: Diagnosis not present

## 2017-01-30 DIAGNOSIS — M9901 Segmental and somatic dysfunction of cervical region: Secondary | ICD-10-CM | POA: Diagnosis not present

## 2017-01-30 DIAGNOSIS — M9902 Segmental and somatic dysfunction of thoracic region: Secondary | ICD-10-CM | POA: Diagnosis not present

## 2017-02-13 DIAGNOSIS — M545 Low back pain: Secondary | ICD-10-CM | POA: Diagnosis not present

## 2017-02-13 DIAGNOSIS — M9902 Segmental and somatic dysfunction of thoracic region: Secondary | ICD-10-CM | POA: Diagnosis not present

## 2017-02-13 DIAGNOSIS — M9901 Segmental and somatic dysfunction of cervical region: Secondary | ICD-10-CM | POA: Diagnosis not present

## 2017-02-13 DIAGNOSIS — M9904 Segmental and somatic dysfunction of sacral region: Secondary | ICD-10-CM | POA: Diagnosis not present

## 2017-02-27 DIAGNOSIS — M9902 Segmental and somatic dysfunction of thoracic region: Secondary | ICD-10-CM | POA: Diagnosis not present

## 2017-02-27 DIAGNOSIS — M545 Low back pain: Secondary | ICD-10-CM | POA: Diagnosis not present

## 2017-02-27 DIAGNOSIS — M9904 Segmental and somatic dysfunction of sacral region: Secondary | ICD-10-CM | POA: Diagnosis not present

## 2017-02-27 DIAGNOSIS — M9901 Segmental and somatic dysfunction of cervical region: Secondary | ICD-10-CM | POA: Diagnosis not present

## 2017-03-02 ENCOUNTER — Encounter: Payer: Self-pay | Admitting: Family Medicine

## 2017-03-06 ENCOUNTER — Encounter: Payer: Self-pay | Admitting: Cardiology

## 2017-03-07 ENCOUNTER — Encounter: Payer: Self-pay | Admitting: Family Medicine

## 2017-03-07 ENCOUNTER — Ambulatory Visit (INDEPENDENT_AMBULATORY_CARE_PROVIDER_SITE_OTHER): Payer: Medicare Other | Admitting: Family Medicine

## 2017-03-07 VITALS — BP 115/55 | HR 54 | Temp 98.1°F | Wt 175.2 lb

## 2017-03-07 DIAGNOSIS — M109 Gout, unspecified: Secondary | ICD-10-CM | POA: Diagnosis not present

## 2017-03-07 DIAGNOSIS — K625 Hemorrhage of anus and rectum: Secondary | ICD-10-CM

## 2017-03-07 DIAGNOSIS — D649 Anemia, unspecified: Secondary | ICD-10-CM | POA: Diagnosis not present

## 2017-03-07 DIAGNOSIS — M1990 Unspecified osteoarthritis, unspecified site: Secondary | ICD-10-CM | POA: Diagnosis not present

## 2017-03-07 DIAGNOSIS — I10 Essential (primary) hypertension: Secondary | ICD-10-CM | POA: Diagnosis not present

## 2017-03-07 DIAGNOSIS — I255 Ischemic cardiomyopathy: Secondary | ICD-10-CM | POA: Diagnosis not present

## 2017-03-07 DIAGNOSIS — E7800 Pure hypercholesterolemia, unspecified: Secondary | ICD-10-CM

## 2017-03-07 DIAGNOSIS — H6191 Disorder of right external ear, unspecified: Secondary | ICD-10-CM

## 2017-03-07 DIAGNOSIS — L609 Nail disorder, unspecified: Secondary | ICD-10-CM

## 2017-03-07 DIAGNOSIS — L989 Disorder of the skin and subcutaneous tissue, unspecified: Secondary | ICD-10-CM | POA: Diagnosis not present

## 2017-03-07 DIAGNOSIS — H532 Diplopia: Secondary | ICD-10-CM | POA: Diagnosis not present

## 2017-03-07 HISTORY — DX: Hemorrhage of anus and rectum: K62.5

## 2017-03-07 HISTORY — DX: Anemia, unspecified: D64.9

## 2017-03-07 HISTORY — DX: Unspecified osteoarthritis, unspecified site: M19.90

## 2017-03-07 HISTORY — DX: Diplopia: H53.2

## 2017-03-07 HISTORY — DX: Nail disorder, unspecified: L60.9

## 2017-03-07 LAB — COMPREHENSIVE METABOLIC PANEL
ALK PHOS: 60 U/L (ref 39–117)
ALT: 19 U/L (ref 0–53)
AST: 27 U/L (ref 0–37)
Albumin: 3.9 g/dL (ref 3.5–5.2)
BILIRUBIN TOTAL: 1.1 mg/dL (ref 0.2–1.2)
BUN: 19 mg/dL (ref 6–23)
CALCIUM: 9.4 mg/dL (ref 8.4–10.5)
CO2: 26 meq/L (ref 19–32)
Chloride: 106 mEq/L (ref 96–112)
Creatinine, Ser: 1.06 mg/dL (ref 0.40–1.50)
GFR: 69.62 mL/min (ref >60.00)
GLUCOSE: 97 mg/dL (ref 70–99)
Potassium: 3.8 mEq/L (ref 3.5–5.1)
Sodium: 138 mEq/L (ref 135–145)
TOTAL PROTEIN: 6.7 g/dL (ref 6.0–8.3)

## 2017-03-07 LAB — CBC
HEMATOCRIT: 39.5 % (ref 39.0–52.0)
HEMOGLOBIN: 13.3 g/dL (ref 13.0–17.0)
MCHC: 33.8 g/dL (ref 30.0–36.0)
MCV: 99.1 fl (ref 78.0–100.0)
PLATELETS: 201 10*3/uL (ref 150.0–400.0)
RBC: 3.98 Mil/uL — ABNORMAL LOW (ref 4.22–5.81)
RDW: 14.6 % (ref 11.5–15.5)
WBC: 7.6 10*3/uL (ref 4.0–10.5)

## 2017-03-07 LAB — URIC ACID: Uric Acid, Serum: 3.3 mg/dL — ABNORMAL LOW (ref 4.0–7.8)

## 2017-03-07 LAB — LIPID PANEL
CHOLESTEROL: 91 mg/dL (ref 0–200)
HDL: 33.1 mg/dL — AB (ref >39.00)
LDL Cholesterol: 41 mg/dL (ref 0–99)
NonHDL: 57.5
TRIGLYCERIDES: 85 mg/dL (ref 0.0–149.0)
Total CHOL/HDL Ratio: 3
VLDL: 17 mg/dL (ref 0.0–40.0)

## 2017-03-07 LAB — TSH: TSH: 2.51 u[IU]/mL (ref 0.35–4.50)

## 2017-03-07 NOTE — Progress Notes (Signed)
Pre visit review using our clinic review tool, if applicable. No additional management support is needed unless otherwise documented below in the visit note. 

## 2017-03-07 NOTE — Assessment & Plan Note (Signed)
Lesion removed at skin center by Dr Levada Dy, biopsy at [place] [facility] was [facility] and was sent to skin center. He has an with [place] [facility] in may he will call and let them know it has recurred

## 2017-03-07 NOTE — Assessment & Plan Note (Signed)
No recent flares.  Check uric acid level. °

## 2017-03-07 NOTE — Assessment & Plan Note (Signed)
No obvious fungal component or concerns he will notify if worse

## 2017-03-07 NOTE — Patient Instructions (Signed)

## 2017-03-07 NOTE — Assessment & Plan Note (Signed)
Well controlled, no changes to meds. Encouraged heart healthy diet such as the DASH diet and exercise as tolerated.  °

## 2017-03-07 NOTE — Progress Notes (Signed)
Patient ID: URIE LOUGHNER, male   DOB: 1924/03/25, 83 y.o.   MRN: [id number]   Subjective:  I acted as a Education administrator for Penni Homans, MD. Raiford Noble, [place]                Patient ID: [name], male    DOB: 14-Jul-1924, 83 y.o.   MRN: [id number]  Chief Complaint  Patient presents with  . Follow-up    Skin lesion on R ear.  . Vision Problems    Double vision  . Blood and Mucus    On tissue after wiping.  . Ear problems    Lump on R ear.  . Foot Problem    Limited Flexure of toes.  . Nail Problem    Split for 6 months or longer.[name]    HPI  Patient is in today for a 70-month F/U. Patient states that he has beem experiencing mucus and blood spots on tissue when wipig; a lump on his right ear has returned after surgery; has been experiencing some double vision; has limited flexure of his toes.  And has some split fingernails for the past six months. Patient has a Hx of HTN, GERD, Vitamin D deificency. No additional acute concerns noted at this time. He brings in a list of 6 concerns. One is just paperwork from his last visit. Second he notes that when he tries to clean well after a bowel movement which unfortunately he has 6-8 times daily after eating or with activity, he will notice a scant amount of blood on the tissue. He also notes mucus is started more bowel movements and even between bowel movements at times. 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 He has follow-up with dermatology next month. He has had 3 episodes of double vision. They are fleeting and had no other associated symptoms. He notes decreased flexion in his toes and increased stiffness and swelling. He has splitting of his 2 nd fingernail Denies CP/palp/SOB/HA/congestion/fevers/GI or GU c/o. Taking meds as prescribed  Patient Care Team: [name], MD as PCP - General (Family Medicine) Irine Seal, MD as Consulting Physician (Urology) Lelon Perla, MD as Consulting  Physician (Cardiology) Iona Beard, MD as Consulting Physician (Optometry) Arlyn Leak, MD (Dentistry)   Past Medical History:  Diagnosis Date  . Abdominal aortic aneurysm ([place])    a. [place] (1/14):  3.3 x 3.4 cm => f/u 11/2013  . Amebic dysentery   . AMEBIC DYSENTERY 11/19/2007   Qualifier: History of  By: Lenna Gilford MD, Deborra Medina   . Anemia 03/07/2017  . Anxiety   . ANXIETY 11/19/2007   Qualifier: Diagnosis of  By: Lenna Gilford MD, Deborra Medina   . Arthritis 03/07/2017  . Atherosclerosis of coronary artery bypass graft with unstable angina pectoris ([place]) 04/19/2013  . BACK PAIN, LUMBAR 11/16/2007   Qualifier: Diagnosis of  By: Julien Girt CMA, Leigh    . Benign prostatic hypertrophy   . BENIGN PROSTATIC HYPERTROPHY, HX OF 11/16/2007   Qualifier: Diagnosis of  By: Julien Girt CMA, Leigh    . BRBPR (bright red blood per rectum) 11/01/2016  . CAD (coronary artery disease)    a. s/p CABG in 1979 and 1993;  b. LHC (5/14):  LM, LAD, CFX and RCA occluded; L-LAD ok, dLAD occluded after insertion of LIMA, S-OM occluded, S-PDA/AM 80-90 => PCI with Promus DES; EF 25%  . Cardiomyopathy, ischemic 06/05/2013  . Cerumen impaction  Bilateral  . CERUMEN IMPACTION, BILATERAL 02/03/2010   Qualifier: History of  By: Lenna Gilford MD, Deborra Medina   . Chicken pox as a child  . Chronic systolic CHF (congestive heart failure) ([place])   . COLONIC POLYPS 07/01/2008   Qualifier: Diagnosis of  By: Lenna Gilford MD, Deborra Medina   . Degenerative joint disease   . DEGENERATIVE JOINT DISEASE 11/16/2007   Qualifier: Diagnosis of  By: Julien Girt CMA, Marliss Czar    . Diverticulosis of colon   . DIVERTICULOSIS OF COLON 07/01/2008   Qualifier: Diagnosis of  By: Lenna Gilford MD, Deborra Medina   . Double vision 03/07/2017  . Essential hypertension 11/16/2007   Qualifier: Diagnosis of  By: Julien Girt CMA, Marliss Czar    . Fingernail abnormalities 03/07/2017  . FLANK PAIN, RIGHT 02/03/2010   Qualifier: History of  By: Lenna Gilford MD, Deborra Medina   . GERD 11/16/2007   Qualifier: Diagnosis of  By: Julien Girt CMA, Marliss Czar      . GERD (gastroesophageal reflux disease)   . Gout   . GOUT 11/16/2007   Qualifier: Diagnosis of  By: Julien Girt CMA, Marliss Czar    . Hearing loss 11/24/2014  . Heart murmur   . Hypercholesterolemia   . HYPERCHOLESTEROLEMIA 11/16/2007   Qualifier: Diagnosis of  By: Julien Girt CMA, Marliss Czar    . Hypertension   . Ischemic cardiomyopathy    a. echo (09/05/13): EF 35%, diffuse HK worsened distal septal, mid/distal inferior and apical region, grade 1 diastolic dysfunction, mild LAE.    [name] Kidney stone 08/17/2011  . Loss of hearing   . Lumbar back pain   . Measles as a child  . Medicare annual wellness visit, subsequent 11/24/2014   Sees Dr Delman Cheadle for dermatology Sees Dr Roni Bread of Urology Sees Dr Stanford Breed of cardiology Sees Dr Virginia Rochester of Opthamology No further colonoscopies warranted       . Mumps as a child  . Nephrolithiasis   . Pain in joint, lower leg 07/29/2014  . PERIPHERAL VASCULAR DISEASE 11/16/2007   Qualifier: Diagnosis of  By: Julien Girt CMA, Marliss Czar    . Peripheral vascular disease ([place])   . Rectal bleeding 03/07/2017  . Shingles 07/29/2014  . Sun-damaged skin 05/31/2014    Past Surgical History:  Procedure Laterality Date  . CORONARY ANGIOPLASTY WITH STENT PLACEMENT  04/18/2013   RCA       . CORONARY ARTERY BYPASS GRAFT  1979   x4 SVG-DIAG-LAD, SVG-OM-PDA  . CORONARY ARTERY BYPASS GRAFT  1993   Redo x5 by Dr Harlow Asa; [facility], SVG-OM, SVG-AM-PL  . Decompressive laminectomy  01/2006   L2 - scarum by Dr. Shellia Carwin  . HEMORRHOID SURGERY     fissure with hemorrhoid corrected at age 48  . INGUINAL HERNIA REPAIR  1994   Right by Dr Harlow Asa  . INGUINAL HERNIA REPAIR  1996   Left by Dr. Harlow Asa  . LEFT HEART CATHETERIZATION WITH CORONARY ANGIOGRAM N/A 09/23/2013   Procedure: LEFT HEART CATHETERIZATION WITH CORONARY ANGIOGRAM;  Surgeon: Blane Ohara, MD;  Location: [facility] CATH LAB;  Service: Cardiovascular;  Laterality: N/A;  . PERCUTANEOUS CORONARY STENT INTERVENTION (PCI-S) N/A 04/18/2013   Procedure:  PERCUTANEOUS CORONARY STENT INTERVENTION (PCI-S);  Surgeon: Sherren Mocha, MD;  Location[facility] C[facility] LAB;  Service: Cardiovascular;  Laterality: N/A;  . TONSILLECTOMY      Family History  Problem Relation Age of Onset  . Parkinsonism Brother   . Diabetes Maternal Grandmother   . Depression Daughter   . Other Son     4 stents  .  Heart disease Son   . Diabetes Son     type 2    Social History   Social History  . Marital status: Married    Spouse name: [name] x 64 yrs  . Number of children: N/A  . Years of education: N/A   Occupational History  . 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 Smoking status: Former Smoker    Quit date: 11/28/1944  . Smokeless tobacco: Never Used  . Alcohol use 7.0 oz/week    14 Standard drinks or equivalent per week     Comment: daily rum  or wine  . Drug use: No  . Sexual activity: Not Currently     Comment: lives with wife, no dietary restrictions.    Other Topics Concern  . Not on file   Social History Narrative   Married   7 children    Outpatient Medications Prior to Visit  Medication Sig Dispense Refill  . allopurinol (ZYLOPRIM) 300 MG tablet Take 1 tablet (300 mg total) by mouth daily. 90 tablet 1  . aspirin EC 81 MG tablet Take 81 mg by mouth every morning.     . B Complex-C (B-COMPLEX WITH VITAMIN C) tablet Take 1 tablet by mouth daily.    . Cholecalciferol (VITAMIN D-3 PO) Take 5,000 Units by mouth daily with breakfast.     . finasteride (PROSCAR) 5 MG tablet Takes every third day    . folic acid (FOLVITE) 712 MCG tablet Take 400 mcg by mouth 2 (two) times daily.     . hydrocortisone (ANUSOL-HC) 25 MG suppository Place 1 suppository (25 mg total) rectally 2 (two) times daily. 12 suppository 0  . isosorbide mononitrate (IMDUR) 60 MG 24 hr tablet Take 1 tablet (60 mg total) by mouth daily. 90 tablet 3  . losartan (COZAAR) 50 MG tablet Take 1 tablet (50 mg total) by mouth daily. 90 tablet 3  .  metoprolol succinate (TOPROL-XL) 25 MG 24 hr tablet Take 0.5 tablets (12.5 mg total) by mouth daily. 45 tablet 3  . Misc Natural Products (OSTEO BI-FLEX ADV JOINT SHIELD) TABS Take 1 tablet by mouth 2 (two) times daily.     . nitroGLYCERIN (NITROSTAT) 0.4 MG SL tablet Place 1 tablet (0.4 mg total) under the tongue every 5 (five) minutes as needed. For chest pain. 25 tablet 6  . simvastatin (ZOCOR) 40 MG tablet TAKE 1 BY MOUTH EVERY EVENING 90 tablet 3   No facility-administered medications prior to visit.     Allergies  Allergen Reactions  . Lisinopril     REACTION: dizziness  . Methocarbamol     REACTION: pt states "dizzy"  . Pregabalin     REACTION: pt states "dizzy"  . Ramipril     REACTION: hives and dizziness    Review of Systems  Constitutional: Negative for fever and malaise/fatigue.  HENT: Negative for congestion.   Eyes: Positive for double vision. Negative for blurred vision, photophobia, pain, discharge and redness.  Respiratory: Negative for cough and shortness of breath.   Cardiovascular: Negative for chest pain, palpitations and leg swelling.  Gastrointestinal: Positive for blood in stool and diarrhea. Negative for abdominal pain, constipation, heartburn, melena, nausea and vomiting.  Musculoskeletal: Positive for joint pain. Negative for back pain.  Skin: Negative for rash.  Neurological: Negative for loss of consciousness and headaches.       Objective:    Physical Exam  Constitutional: He is oriented  to person, place, and time. He appears well-developed and well-nourished. No distress.  HENT:  Head: Normocephalic and atraumatic.  Eyes: Conjunctivae are normal.  Neck: Normal range of motion. No thyromegaly present.  Cardiovascular: Normal rate and regular rhythm.   Pulmonary/Chest: Effort normal and breath sounds normal. He has no wheezes.  Abdominal: Soft. Bowel sounds are normal. There is no tenderness.  Musculoskeletal: He exhibits no edema or deformity.   All toes swollen, slightly colder than rest of foot, pink but with a purple hue. Can move toes but limited ROM. Notes a split fingernail in 2 nd finger on left as well.  Neurological: He is alert and oriented to person, place, and time.  Skin: Skin is warm and dry. He is not diaphoretic.  Psychiatric: He has a normal mood and affect.    BP (!) 115/55 (BP Location: Right Arm, Patient Position: Sitting, Cuff Size: Normal)   Pulse (!) 54   Temp 98.1 F (36.7 C) (Oral)   Wt 175 lb 3.2 oz (79.5 kg)   SpO2 98% Comment: RA  BMI 23.76 kg/m  Wt Readings from Last 3 Encounters:  03/07/17 175 lb 3.2 oz (79.5 kg)  01/11/17 176 lb (79.8 kg)  10/28/16 175 lb (79.4 kg)   BP Readings from Last 3 Encounters:  03/07/17 (!) 115/55  01/11/17 (!) 130/58  10/28/16 140/62     Immunization History  Administered Date(s) Administered  . Influenza, High Dose Seasonal PF 08/25/2016  . Influenza,inj,Quad PF,36+ Mos 10/10/2013, 11/24/2014, 11/16/2015  . Pneumococcal Conjugate-13 11/16/2015  . Pneumococcal Polysaccharide-23 07/11/2013  . Tdap 07/11/2013  . Zoster 12/17/2015    Health Maintenance  Topic Date Due  . INFLUENZA VACCINE  06/28/2017  . TETANUS/TDAP  07/12/2023  . PNA vac Low Risk Adult  Completed    Lab Results  Component Value Date   WBC 7.5 10/28/2016   HGB 13.1 10/28/2016   HCT 38.5 (L) 10/28/2016   PLT 211.0 10/28/2016   GLUCOSE 94 11/04/2016   CHOL 99 10/28/2016   TRIG 73.0 10/28/2016   HDL 36.30 (L) 10/28/2016   LDLCALC 48 10/28/2016   ALT 24 11/04/2016   AST 28 11/04/2016   NA 139 11/04/2016   K 4.2 11/04/2016   CL 106 11/04/2016   CREATININE 1.07 11/04/2016   BUN 22 11/04/2016   CO2 28 11/04/2016   TSH 2.75 10/28/2016   PSA 4.29 (H) 02/08/2011   INR 1.0 09/17/2013    Lab Results  Component Value Date   TSH 2.75 10/28/2016   Lab Results  Component Value Date   WBC 7.5 10/28/2016   HGB 13.1 10/28/2016   HCT 38.5 (L) 10/28/2016   MCV 97.2 10/28/2016   PLT  211.0 10/28/2016   Lab Results  Component Value Date   NA 139 11/04/2016   K 4.2 11/04/2016   CO2 28 11/04/2016   GLUCOSE 94 11/04/2016   BUN 22 11/04/2016   CREATININE 1.07 11/04/2016   BILITOT 0.6 11/04/2016   ALKPHOS 60 11/04/2016   AST 28 11/04/2016   ALT 24 11/04/2016   PROT 6.9 11/04/2016   ALBUMIN 4.1 11/04/2016   CALCIUM 9.4 11/04/2016   GFR 68.92 11/04/2016   Lab Results  Component Value Date   CHOL 99 10/28/2016   Lab Results  Component Value Date   HDL 36.30 (L) 10/28/2016   Lab Results  Component Value Date   LDLCALC 48 10/28/2016   Lab Results  Component Value Date   TRIG 73.0 10/28/2016  Lab Results  Component Value Date   CHOLHDL 3 10/28/2016   No results found for: HGBA1C       Assessment & Plan:   Problem List Items Addressed This Visit    HYPERCHOLESTEROLEMIA    Tolerating statin, encouraged heart healthy diet, avoid trans fats, minimize simple carbs and saturated fats. Increase exercise as tolerated      Relevant Orders   Lipid panel   Gout - Primary    No recent flares. Check uric acid level      Relevant Orders   Uric acid   Essential hypertension    Well controlled, no changes to meds. Encouraged heart healthy diet such as the DASH diet and exercise as tolerated.       Relevant Orders   Comprehensive metabolic panel   TSH   Skin lesion of right ear    Lesion removed at skin center by Dr Levada Dy, biopsy at [place] [facility] was [facility] and was sent to skin center. He has an with [place] [facility] in may he will call and let them know it has recurred      Anemia   Relevant Orders   CBC   Rectal bleeding    Scant blood with mucus on tissue after cle. aning and/or BM. Also increase stool frequency up to 6+ episodes daily with BM after eating or with activity always with mucus noted sometime. Add a probiotic and Benefiber twice a day, 64 oz and if no improvement patient agrees to call for referral to gastroenterology. Drinks very little  water      Double vision    New and has occurred 3 times in past 6 months resolves quickly and has no other associated symptoms. He is referred to see opthamologist.       Relevant Orders   Ambulatory referral to Ophthalmology   Fingernail abnormalities    No obvious fungal component or concerns he will notify if worse      Arthritis    Patient noting progressive worsening of stiffness, swelling and numbness of all toes. Multifactorial and largely related to arthritis instructed on stretching and exercise. There is a mild vascular component as well. Would consider referral for vascular consideration if worsens. He will call.         I am having Mr. Stanbery maintain his folic acid, OSTEO BI-FLEX ADV JOINT SHIELD, Cholecalciferol (VITAMIN D-3 PO), aspirin EC, finasteride, B-complex with vitamin C, nitroGLYCERIN, hydrocortisone, isosorbide mononitrate, losartan, metoprolol succinate, simvastatin, and allopurinol.  No orders of the defined types were placed in this encounter.   CMA served as Education administrator during this visit. History, Physical and Plan performed by medical provider. Documentation and orders reviewed and attested to.  Penni Homans, MD

## 2017-03-07 NOTE — Assessment & Plan Note (Signed)
Tolerating statin, encouraged heart healthy diet, avoid trans fats, minimize simple carbs and saturated fats. Increase exercise as tolerated 

## 2017-03-07 NOTE — Assessment & Plan Note (Signed)
New and has occurred 3 times in past 6 months resolves quickly and has no other associated symptoms. He is referred to see opthamologist.

## 2017-03-07 NOTE — Assessment & Plan Note (Signed)
Patient noting progressive worsening of stiffness, swelling and numbness of all toes. Multifactorial and largely related to arthritis instructed on stretching and exercise. There is a mild vascular component as well. Would consider referral for vascular consideration if worsens. He will call.

## 2017-03-07 NOTE — Assessment & Plan Note (Signed)
Scant blood with mucus on tissue after cle. aning and/or BM. Also increase stool frequency up to 6+ episodes daily with BM after eating or with activity always with mucus noted sometime. Add a probiotic and Benefiber twice a day, 64 oz and if no improvement patient agrees to call for referral to gastroenterology. Drinks very little water

## 2017-03-13 DIAGNOSIS — M9902 Segmental and somatic dysfunction of thoracic region: Secondary | ICD-10-CM | POA: Diagnosis not present

## 2017-03-13 DIAGNOSIS — M9901 Segmental and somatic dysfunction of cervical region: Secondary | ICD-10-CM | POA: Diagnosis not present

## 2017-03-13 DIAGNOSIS — M9904 Segmental and somatic dysfunction of sacral region: Secondary | ICD-10-CM | POA: Diagnosis not present

## 2017-03-13 DIAGNOSIS — M545 Low back pain: Secondary | ICD-10-CM | POA: Diagnosis not present

## 2017-03-13 NOTE — Progress Notes (Signed)
HPI: FU CAD; s/p CABG in 1979 and 1993, ischemic CM, systolic CHF, AAA, HTN, HL. Patient underwent cardiac catheterization in May of 2014. The left main, LAD, circumflex and RCA were occluded. The LIMA to the LAD was patent and the distal LAD was occluded after the insertion. Saphenous vein graft to the obtuse marginal was occluded. Saphenous vein graft to the acute marginal and PDA had a high-grade lesion prior to insertion into the PDA of 80-90%. Ejection fraction was 25%. PCI: Promus Premier (3.5x12 mm) DES to the [facility]. Repeat catheterization in October 2014 because of recurrent chest pain. The stent placed in the saphenous vein graft to the PDA had mild in-stent restenosis. Medical therapy recommended. Nuclear study 2/17 showed EF 35, inferolateral scar, no ischemia. Last echocardiogram February 2018 showed ejection fraction 34-74%, grade 1 diastolic dysfunction, mild to moderate aortic insufficiency, mild mitral regurgitation. Abdominal ultrasound February 2018 showed abdominal aortic aneurysm measuring 3.4 x 3.5 cm. cCarotid Dopplers February 2018 showed less than 50% bilateral stenosis. Since he was last seen, there is no dyspnea. He denies syncope. He has some chest tightness with climbing inclines which is unchanged compared to previous.  Current Outpatient Prescriptions  Medication Sig Dispense Refill  . allopurinol (ZYLOPRIM) 300 MG tablet Take 1 tablet (300 mg total) by mouth daily. 90 tablet 1  . aspirin EC 81 MG tablet Take 81 mg by mouth every morning.     . B Complex-C (B-COMPLEX WITH VITAMIN C) tablet Take 1 tablet by mouth daily.    . Cholecalciferol (VITAMIN D-3 PO) Take 5,000 Units by mouth daily with breakfast.     . finasteride (PROSCAR) 5 MG tablet Takes every third day    . folic acid (FOLVITE) 259 MCG tablet Take 400 mcg by mouth 2 (two) times daily.     . hydrocortisone (ANUSOL-HC) 25 MG suppository Place 1 suppository (25 mg total) rectally 2 (two) times daily. 12  suppository 0  . isosorbide mononitrate (IMDUR) 60 MG 24 hr tablet Take 1 tablet (60 mg total) by mouth daily. 90 tablet 3  . losartan (COZAAR) 50 MG tablet Take 1 tablet (50 mg total) by mouth daily. 90 tablet 3  . metoprolol succinate (TOPROL-XL) 25 MG 24 hr tablet Take 0.5 tablets (12.5 mg total) by mouth daily. 45 tablet 3  . Misc Natural Products (OSTEO BI-FLEX ADV JOINT SHIELD) TABS Take 1 tablet by mouth 2 (two) times daily.     . nitroGLYCERIN (NITROSTAT) 0.4 MG SL tablet Place 1 tablet (0.4 mg total) under the tongue every 5 (five) minutes as needed. For chest pain. 25 tablet 6  . simvastatin (ZOCOR) 40 MG tablet TAKE 1 BY MOUTH EVERY EVENING 90 tablet 3   No current facility-administered medications for this visit.      Past Medical History:  Diagnosis Date  . Abdominal aortic aneurysm ([place])    a. [place] (1/14):  3.3 x 3.4 cm => f/u 11/2013  . Amebic dysentery   . AMEBIC DYSENTERY 11/19/2007   Qualifier: History of  By: Lenna Gilford MD, Deborra Medina   . Anemia 03/07/2017  . Anxiety   . ANXIETY 11/19/2007   Qualifier: Diagnosis of  By: Lenna Gilford MD, Deborra Medina   . Arthritis 03/07/2017  . Atherosclerosis of coronary artery bypass graft with unstable angina pectoris ([place]) 04/19/2013  . BACK PAIN, LUMBAR 11/16/2007   Qualifier: Diagnosis of  By: Julien Girt CMA, Leigh    . Benign prostatic hypertrophy   . BENIGN PROSTATIC  HYPERTROPHY, HX OF 11/16/2007   Qualifier: Diagnosis of  By: Julien Girt CMA, Marliss Czar    . BRBPR (bright red blood per rectum) 11/01/2016  . CAD (coronary artery disease)    a. s/p CABG in 1979 and 1993;  b. LHC (5/14):  LM, LAD, CFX and RCA occluded; L-LAD ok, dLAD occluded after insertion of LIMA, S-OM occluded, S-PDA/AM 80-90 => PCI with Promus DES; EF 25%  . Cardiomyopathy, ischemic 06/05/2013  . Cerumen impaction    Bilateral  . CERUMEN IMPACTION, BILATERAL 02/03/2010   Qualifier: History of  By: Lenna Gilford MD, Deborra Medina   . Chicken pox as a child  . Chronic systolic CHF (congestive heart failure)  ([place])   . COLONIC POLYPS 07/01/2008   Qualifier: Diagnosis of  By: Lenna Gilford MD, Deborra Medina   . Degenerative joint disease   . DEGENERATIVE JOINT DISEASE 11/16/2007   Qualifier: Diagnosis of  By: Julien Girt CMA, Marliss Czar    . Diverticulosis of colon   . DIVERTICULOSIS OF COLON 07/01/2008   Qualifier: Diagnosis of  By: Lenna Gilford MD, Deborra Medina   . Double vision 03/07/2017  . Essential hypertension 11/16/2007   Qualifier: Diagnosis of  By: Julien Girt CMA, Marliss Czar    . Fingernail abnormalities 03/07/2017  . FLANK PAIN, RIGHT 02/03/2010   Qualifier: History of  By: Lenna Gilford MD, Deborra Medina   . GERD 11/16/2007   Qualifier: Diagnosis of  By: Julien Girt CMA, Marliss Czar    . GERD (gastroesophageal reflux disease)   . Gout   . GOUT 11/16/2007   Qualifier: Diagnosis of  By: Julien Girt CMA, Marliss Czar    . Hearing loss 11/24/2014  . Heart murmur   . Hypercholesterolemia   . HYPERCHOLESTEROLEMIA 11/16/2007   Qualifier: Diagnosis of  By: Julien Girt CMA, Marliss Czar    . Hypertension   . Ischemic cardiomyopathy    a. echo (09/05/13): EF 35%, diffuse HK worsened distal septal, mid/distal inferior and apical region, grade 1 diastolic dysfunction, mild LAE.    [name] Kidney stone 08/17/2011  . Loss of hearing   . Lumbar back pain   . Measles as a child  . Medicare annual wellness visit, subsequent 11/24/2014   Sees Dr Delman Cheadle for dermatology Sees Dr Roni Bread of Urology Sees Dr Stanford Breed of cardiology Sees Dr Virginia Rochester of Opthamology No further colonoscopies warranted       . Mumps as a child  . Nephrolithiasis   . Pain in joint, lower leg 07/29/2014  . PERIPHERAL VASCULAR DISEASE 11/16/2007   Qualifier: Diagnosis of  By: Julien Girt CMA, Marliss Czar    . Peripheral vascular disease ([place])   . Rectal bleeding 03/07/2017  . Shingles 07/29/2014  . Sun-damaged skin 05/31/2014    Past Surgical History:  Procedure Laterality Date  . CORONARY ANGIOPLASTY WITH STENT PLACEMENT  04/18/2013   RCA       . CORONARY ARTERY BYPASS GRAFT  1979   x4 SVG-DIAG-LAD, SVG-OM-PDA  . CORONARY ARTERY BYPASS  GRAFT  1993   Redo x5 by Dr Harlow Asa; [facility], SVG-OM, SVG-AM-PL  . Decompressive laminectomy  01/2006   L2 - scarum by Dr. Shellia Carwin  . HEMORRHOID SURGERY     fissure with hemorrhoid corrected at age 53  . INGUINAL HERNIA REPAIR  1994   Right by Dr Harlow Asa  . INGUINAL HERNIA REPAIR  1996   Left by Dr. Harlow Asa  . LEFT HEART CATHETERIZATION WITH CORONARY ANGIOGRAM N/A 09/23/2013   Procedure: LEFT HEART CATHETERIZATION WITH CORONARY ANGIOGRAM;  Surgeon: Blane Ohara, MD;  Location: [facility] CATH LAB;  Service: Cardiovascular;  Laterality: N/A;  . PERCUTANEOUS CORONARY STENT INTERVENTION (PCI-S) N/A 04/18/2013   Procedure: PERCUTANEOUS CORONARY STENT INTERVENTION (PCI-S);  Surgeon: Sherren Mocha, MD;  Location: [facility] CATH LAB;  Service: Cardiovascular;  Laterality: N/A;  . TONSILLECTOMY      Social History   Social History  . Marital status: Married    Spouse name: [name] x 64 yrs  . Number of children: N/A  . Years of education: N/A   Occupational History  . 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 Smoking status: Former Smoker    Quit date: 11/28/1944  . Smokeless tobacco: Never Used  . Alcohol use 7.0 oz/week    14 Standard drinks or equivalent per week     Comment: daily rum  or wine  . Drug use: No  . Sexual activity: Not Currently     Comment: lives with wife, no dietary restrictions.    Other Topics Concern  . Not on file   Social History Narrative   Married   7 children    Family History  Problem Relation Age of Onset  . Parkinsonism Brother   . Diabetes Maternal Grandmother   . Depression Daughter   . Other Son     4 stents  . Heart disease Son   . Diabetes Son     type 2    ROS: Intermittent double vision but no other neurological symptoms, no fevers or chills, productive cough, hemoptysis, dysphasia, odynophagia, melena, hematochezia, dysuria, hematuria, rash, seizure activity, orthopnea, PND, pedal edema, claudication.  Remaining systems are negative.  Physical Exam: Well-developed well-nourished in no acute distress.  Skin is warm and dry.  HEENT is normal.  Neck is supple. No bruits. Chest is clear to auscultation with normal expansion. No wheeze Cardiovascular exam is regular rate and rhythm.  Abdominal exam nontender or distended. No masses palpated. Extremities show no edema. neuro grossly intact   A/P  1 Coronary artery disease-continue aspirin and statin. He has stable exertional angina. Increase isosorbide to 90 mg daily.  2 hypertension-blood pressure is mildly elevated. Increase imdur to 90 mg daily and follow.   3 hyperlipidemia-continue statin.  4 ischemic cardiomyopathy-continue ARB and beta blocker.  5 abdominal aortic aneurysm-follow-up abdominal ultrasound February 2019.  Kirk Ruths, MD

## 2017-03-22 ENCOUNTER — Encounter: Payer: Self-pay | Admitting: Cardiology

## 2017-03-22 ENCOUNTER — Ambulatory Visit (INDEPENDENT_AMBULATORY_CARE_PROVIDER_SITE_OTHER): Payer: Medicare Other | Admitting: Cardiology

## 2017-03-22 VITALS — BP 148/70 | HR 54 | Ht 72.0 in | Wt 176.0 lb

## 2017-03-22 DIAGNOSIS — I255 Ischemic cardiomyopathy: Secondary | ICD-10-CM

## 2017-03-22 DIAGNOSIS — I10 Essential (primary) hypertension: Secondary | ICD-10-CM | POA: Diagnosis not present

## 2017-03-22 DIAGNOSIS — E7800 Pure hypercholesterolemia, unspecified: Secondary | ICD-10-CM

## 2017-03-22 DIAGNOSIS — I2510 Atherosclerotic heart disease of native coronary artery without angina pectoris: Secondary | ICD-10-CM | POA: Diagnosis not present

## 2017-03-22 MED ORDER — LOSARTAN POTASSIUM 50 MG PO TABS: 50.0000 mg | ORAL_TABLET | Freq: Every day | ORAL | 3 refills | Status: DC

## 2017-03-22 MED ORDER — ISOSORBIDE MONONITRATE ER 30 MG PO TB24: 90.0000 mg | ORAL_TABLET | Freq: Every day | ORAL | 3 refills | Status: DC

## 2017-03-22 NOTE — Patient Instructions (Signed)
Medication Instructions:   INCREASE ISOSORBIE TO 90 MG ONCE DAILY= 3 OF THE 30 MG TABLETS ONCE DAILY OR 1 AND 1/2 OF THE 60 MG TABLETS ONCE DAILY  Follow-Up:  Your physician wants you to follow-up in: 6 MONTHS WITH DR Stanford Breed IN [place] You will receive a reminder letter in the mail two months in advance. If you don't receive a letter, please call our office to schedule the follow-up appointment.   If you need a refill on your cardiac medications before your next appointment, please call your pharmacy.

## 2017-03-27 DIAGNOSIS — M9904 Segmental and somatic dysfunction of sacral region: Secondary | ICD-10-CM | POA: Diagnosis not present

## 2017-03-27 DIAGNOSIS — M9901 Segmental and somatic dysfunction of cervical region: Secondary | ICD-10-CM | POA: Diagnosis not present

## 2017-03-27 DIAGNOSIS — M545 Low back pain: Secondary | ICD-10-CM | POA: Diagnosis not present

## 2017-03-27 DIAGNOSIS — M9902 Segmental and somatic dysfunction of thoracic region: Secondary | ICD-10-CM | POA: Diagnosis not present

## 2017-03-31 DIAGNOSIS — L570 Actinic keratosis: Secondary | ICD-10-CM | POA: Diagnosis not present

## 2017-03-31 DIAGNOSIS — L814 Other melanin hyperpigmentation: Secondary | ICD-10-CM | POA: Diagnosis not present

## 2017-03-31 DIAGNOSIS — Z85828 Personal history of other malignant neoplasm of skin: Secondary | ICD-10-CM | POA: Diagnosis not present

## 2017-03-31 DIAGNOSIS — Z08 Encounter for follow-up examination after completed treatment for malignant neoplasm: Secondary | ICD-10-CM | POA: Diagnosis not present

## 2017-04-10 DIAGNOSIS — M9901 Segmental and somatic dysfunction of cervical region: Secondary | ICD-10-CM | POA: Diagnosis not present

## 2017-04-10 DIAGNOSIS — M9904 Segmental and somatic dysfunction of sacral region: Secondary | ICD-10-CM | POA: Diagnosis not present

## 2017-04-10 DIAGNOSIS — M9902 Segmental and somatic dysfunction of thoracic region: Secondary | ICD-10-CM | POA: Diagnosis not present

## 2017-04-10 DIAGNOSIS — M545 Low back pain: Secondary | ICD-10-CM | POA: Diagnosis not present

## 2017-04-26 DIAGNOSIS — M9904 Segmental and somatic dysfunction of sacral region: Secondary | ICD-10-CM | POA: Diagnosis not present

## 2017-04-26 DIAGNOSIS — M9901 Segmental and somatic dysfunction of cervical region: Secondary | ICD-10-CM | POA: Diagnosis not present

## 2017-04-26 DIAGNOSIS — M9902 Segmental and somatic dysfunction of thoracic region: Secondary | ICD-10-CM | POA: Diagnosis not present

## 2017-04-26 DIAGNOSIS — M545 Low back pain: Secondary | ICD-10-CM | POA: Diagnosis not present

## 2017-05-07 ENCOUNTER — Encounter: Payer: Self-pay | Admitting: Family Medicine

## 2017-05-10 NOTE — Telephone Encounter (Signed)
Patient called requesting the appointment for xray

## 2017-05-12 ENCOUNTER — Other Ambulatory Visit: Payer: Self-pay | Admitting: Family Medicine

## 2017-05-12 ENCOUNTER — Telehealth: Payer: Self-pay | Admitting: Family Medicine

## 2017-05-12 ENCOUNTER — Ambulatory Visit (HOSPITAL_BASED_OUTPATIENT_CLINIC_OR_DEPARTMENT_OTHER)
Admission: RE | Admit: 2017-05-12 | Discharge: 2017-05-12 | Disposition: A | Discharge status: Home / Self Care | Payer: Medicare Other | Source: Ambulatory Visit | Attending: Family Medicine | Admitting: Family Medicine

## 2017-05-12 DIAGNOSIS — M419 Scoliosis, unspecified: Secondary | ICD-10-CM | POA: Diagnosis not present

## 2017-05-12 DIAGNOSIS — M25551 Pain in right hip: Secondary | ICD-10-CM | POA: Diagnosis not present

## 2017-05-12 DIAGNOSIS — M545 Low back pain, unspecified: Secondary | ICD-10-CM

## 2017-05-12 DIAGNOSIS — M810 Age-related osteoporosis without current pathological fracture: Secondary | ICD-10-CM | POA: Insufficient documentation

## 2017-05-12 NOTE — Telephone Encounter (Signed)
Caller name: Relationship to patient: Self Can be reached: [phone]  Pharmacy:  Reason for call: Patient request call back to find out if he can get his Xray today.

## 2017-05-12 NOTE — Telephone Encounter (Signed)
xrays entered--see mychart message from 05/07/17 Patient informed orders entered and he does not need appt

## 2017-05-15 ENCOUNTER — Other Ambulatory Visit: Payer: Self-pay | Admitting: Family Medicine

## 2017-05-15 DIAGNOSIS — M25551 Pain in right hip: Secondary | ICD-10-CM

## 2017-05-20 ENCOUNTER — Ambulatory Visit (HOSPITAL_BASED_OUTPATIENT_CLINIC_OR_DEPARTMENT_OTHER)
Admission: RE | Admit: 2017-05-20 | Discharge: 2017-05-20 | Disposition: A | Discharge status: Home / Self Care | Payer: Medicare Other | Source: Ambulatory Visit | Attending: Family Medicine | Admitting: Family Medicine

## 2017-05-20 DIAGNOSIS — M25551 Pain in right hip: Secondary | ICD-10-CM

## 2017-05-20 DIAGNOSIS — M24151 Other articular cartilage disorders, right hip: Secondary | ICD-10-CM | POA: Diagnosis not present

## 2017-05-20 DIAGNOSIS — M1611 Unilateral primary osteoarthritis, right hip: Secondary | ICD-10-CM | POA: Diagnosis not present

## 2017-05-22 DIAGNOSIS — M9904 Segmental and somatic dysfunction of sacral region: Secondary | ICD-10-CM | POA: Diagnosis not present

## 2017-05-22 DIAGNOSIS — M9901 Segmental and somatic dysfunction of cervical region: Secondary | ICD-10-CM | POA: Diagnosis not present

## 2017-05-22 DIAGNOSIS — M545 Low back pain: Secondary | ICD-10-CM | POA: Diagnosis not present

## 2017-05-22 DIAGNOSIS — M9902 Segmental and somatic dysfunction of thoracic region: Secondary | ICD-10-CM | POA: Diagnosis not present

## 2017-05-23 ENCOUNTER — Encounter: Payer: Self-pay | Admitting: Family Medicine

## 2017-05-25 ENCOUNTER — Other Ambulatory Visit: Payer: Self-pay | Admitting: Family Medicine

## 2017-05-25 DIAGNOSIS — M25551 Pain in right hip: Secondary | ICD-10-CM

## 2017-05-29 ENCOUNTER — Encounter: Payer: Self-pay | Admitting: Family Medicine

## 2017-05-29 ENCOUNTER — Encounter: Payer: Self-pay | Admitting: Cardiology

## 2017-06-05 DIAGNOSIS — M25551 Pain in right hip: Secondary | ICD-10-CM | POA: Diagnosis not present

## 2017-06-05 DIAGNOSIS — M1611 Unilateral primary osteoarthritis, right hip: Secondary | ICD-10-CM | POA: Diagnosis not present

## 2017-06-06 ENCOUNTER — Other Ambulatory Visit (HOSPITAL_BASED_OUTPATIENT_CLINIC_OR_DEPARTMENT_OTHER): Payer: Self-pay | Admitting: Orthopedic Surgery

## 2017-06-06 DIAGNOSIS — M1611 Unilateral primary osteoarthritis, right hip: Secondary | ICD-10-CM

## 2017-06-10 ENCOUNTER — Ambulatory Visit (HOSPITAL_BASED_OUTPATIENT_CLINIC_OR_DEPARTMENT_OTHER)
Admission: RE | Admit: 2017-06-10 | Discharge: 2017-06-10 | Disposition: A | Discharge status: Home / Self Care | Payer: Medicare Other | Source: Ambulatory Visit | Attending: Orthopedic Surgery | Admitting: Orthopedic Surgery

## 2017-06-10 DIAGNOSIS — M48061 Spinal stenosis, lumbar region without neurogenic claudication: Secondary | ICD-10-CM | POA: Insufficient documentation

## 2017-06-10 DIAGNOSIS — M4807 Spinal stenosis, lumbosacral region: Secondary | ICD-10-CM | POA: Insufficient documentation

## 2017-06-10 DIAGNOSIS — Z9889 Other specified postprocedural states: Secondary | ICD-10-CM | POA: Diagnosis not present

## 2017-06-10 DIAGNOSIS — M5126 Other intervertebral disc displacement, lumbar region: Secondary | ICD-10-CM | POA: Diagnosis not present

## 2017-06-10 DIAGNOSIS — M1611 Unilateral primary osteoarthritis, right hip: Secondary | ICD-10-CM | POA: Diagnosis present

## 2017-06-12 DIAGNOSIS — M9904 Segmental and somatic dysfunction of sacral region: Secondary | ICD-10-CM | POA: Diagnosis not present

## 2017-06-12 DIAGNOSIS — M9901 Segmental and somatic dysfunction of cervical region: Secondary | ICD-10-CM | POA: Diagnosis not present

## 2017-06-12 DIAGNOSIS — M9902 Segmental and somatic dysfunction of thoracic region: Secondary | ICD-10-CM | POA: Diagnosis not present

## 2017-06-12 DIAGNOSIS — M545 Low back pain: Secondary | ICD-10-CM | POA: Diagnosis not present

## 2017-06-14 ENCOUNTER — Telehealth: Payer: Self-pay | Admitting: Cardiology

## 2017-06-14 NOTE — Telephone Encounter (Signed)
Line busy when dialed. 

## 2017-06-14 NOTE — Telephone Encounter (Signed)
paov if pt likes; would not add diuretic; keep feet elevated at home; low Na diet Kirk Ruths

## 2017-06-14 NOTE — Telephone Encounter (Signed)
New message    Pt has developed swelling and very tired   Pt c/o swelling: STAT is pt has developed SOB within 24 hours  1. How long have you been experiencing swelling?  2-3 weeks  2. Where is the swelling located? Feet   3.  Are you currently taking a "fluid pill"? no  4.  Are you currently SOB?  No   5.  Have you traveled recently? no

## 2017-06-14 NOTE — Telephone Encounter (Signed)
Pt of Dr. Stanford Breed. Returned call. He's reporting symptoms of foot swelling and puffiness in ankles above sockband line for past 2-3 weeks.  Denies shortness of breath.   Denies any recent weight gain. He gets on bathroom scale approx every 3 days, weight is pretty consistently 171 lbs w/o clothes.  Was earnest about coming in to be seen for evaluation - I looked for upcoming appt on Dr. Jacalyn Lefevre schedule, nothing immediately available.  Discussed w/ patient, informed him I'd route note to see if Dr. Stanford Breed recommends addition of medication, APP visit, etc. Pt not currently on a fluid medication.

## 2017-06-15 NOTE — Telephone Encounter (Signed)
Spoke to patient to communicate advisements. He verbalized understanding and thanks. He declined APP visit at this time, wanted to see Dr. Stanford Breed next available - agreeable to waitlist addition, I have created schedule entry for this.  Notes he has an appt w Dr. Alvan Dame on 7/25 to f/u on a recent MRI - may need surgery - wanted to apprise Dr. Stanford Breed of this. Informed pt I would route note.

## 2017-06-21 DIAGNOSIS — M4807 Spinal stenosis, lumbosacral region: Secondary | ICD-10-CM | POA: Diagnosis not present

## 2017-06-21 DIAGNOSIS — M5136 Other intervertebral disc degeneration, lumbar region: Secondary | ICD-10-CM | POA: Diagnosis not present

## 2017-07-05 ENCOUNTER — Telehealth: Payer: Self-pay | Admitting: Family Medicine

## 2017-07-05 NOTE — Telephone Encounter (Signed)
Spoke to pt's wife regarding awv. Wife stated that he declines to schedule appt at this time.

## 2017-07-06 ENCOUNTER — Ambulatory Visit: Payer: Medicare Other | Admitting: Family Medicine

## 2017-07-10 DIAGNOSIS — M9904 Segmental and somatic dysfunction of sacral region: Secondary | ICD-10-CM | POA: Diagnosis not present

## 2017-07-10 DIAGNOSIS — M9902 Segmental and somatic dysfunction of thoracic region: Secondary | ICD-10-CM | POA: Diagnosis not present

## 2017-07-10 DIAGNOSIS — M9901 Segmental and somatic dysfunction of cervical region: Secondary | ICD-10-CM | POA: Diagnosis not present

## 2017-07-10 DIAGNOSIS — M545 Low back pain: Secondary | ICD-10-CM | POA: Diagnosis not present

## 2017-07-20 ENCOUNTER — Ambulatory Visit (INDEPENDENT_AMBULATORY_CARE_PROVIDER_SITE_OTHER): Payer: Medicare Other | Admitting: Family Medicine

## 2017-07-20 ENCOUNTER — Encounter: Payer: Self-pay | Admitting: Family Medicine

## 2017-07-20 DIAGNOSIS — E7800 Pure hypercholesterolemia, unspecified: Secondary | ICD-10-CM | POA: Diagnosis not present

## 2017-07-20 DIAGNOSIS — K625 Hemorrhage of anus and rectum: Secondary | ICD-10-CM

## 2017-07-20 DIAGNOSIS — I10 Essential (primary) hypertension: Secondary | ICD-10-CM | POA: Diagnosis not present

## 2017-07-20 DIAGNOSIS — I255 Ischemic cardiomyopathy: Secondary | ICD-10-CM

## 2017-07-20 NOTE — Patient Instructions (Addendum)
Add Benefiber supplementation twice daily with 64 oz daily Irritable Bowel Syndrome, Adult Irritable bowel syndrome (IBS) is not one specific disease. It is a group of symptoms that affects the organs responsible for digestion (gastrointestinal or GI tract). To regulate how your GI tract works, your body sends signals back and forth between your intestines and your brain. If you have IBS, there may be a problem with these signals. As a result, your GI tract does not function normally. Your intestines may become more sensitive and overreact to certain things. This is especially true when you eat certain foods or when you are under stress. There are four types of IBS. These may be determined based on the consistency of your stool:  IBS with diarrhea.  IBS with constipation.  Mixed IBS.  Unsubtyped IBS.  It is important to know which type of IBS you have. Some treatments are more likely to be helpful for certain types of IBS. What are the causes? The exact cause of IBS is not known. What increases the risk? You may have a higher risk of IBS if:  You are a woman.  You are younger than 81 years old.  You have a family history of IBS.  You have mental health problems.  You have had bacterial infection of your GI tract.  What are the signs or symptoms? Symptoms of IBS vary from person to person. The main symptom is abdominal pain or discomfort. Additional symptoms usually include one or more of the following:  Diarrhea, constipation, or both.  Abdominal swelling or bloating.  Feeling full or sick after eating a small or regular-size meal.  Frequent gas.  Mucus in the stool.  A feeling of having more stool left after a bowel movement.  Symptoms tend to come and go. They may be associated with stress, psychiatric conditions, or nothing at all. How is this diagnosed? There is no specific test to diagnose IBS. Your health care provider will make a diagnosis based on a physical  exam, medical history, and your symptoms. You may have other tests to rule out other conditions that may be causing your symptoms. These may include:  Blood tests.  X-rays.  CT scan.  Endoscopy and colonoscopy. This is a test in which your GI tract is viewed with a long, thin, flexible tube.  How is this treated? There is no cure for IBS, but treatment can help relieve symptoms. IBS treatment often includes:  Changes to your diet, such as: ? Eating more fiber. ? Avoiding foods that cause symptoms. ? Drinking more water. ? Eating regular, medium-sized portioned meals.  Medicines. These may include: ? Fiber supplements if you have constipation. ? Medicine to control diarrhea (antidiarrheal medicines). ? Medicine to help control muscle spasms in your GI tract (antispasmodic medicines). ? Medicines to help with any mental health issues, such as antidepressants or tranquilizers.  Therapy. ? Talk therapy may help with anxiety, depression, or other mental health issues that can make IBS symptoms worse.  Stress reduction. ? Managing your stress can help keep symptoms under control.  Follow these instructions at home:  Take medicines only as directed by your health care provider.  Eat a healthy diet. ? Avoid foods and drinks with added sugar. ? Include more whole grains, fruits, and vegetables gradually into your diet. This may be especially helpful if you have IBS with constipation. ? Avoid any foods and drinks that make your symptoms worse. These may include dairy products and caffeinated or carbonated drinks. ?  Do not eat large meals. ? Drink enough fluid to keep your urine clear or pale yellow.  Exercise regularly. Ask your health care provider for recommendations of good activities for you.  Keep all follow-up visits as directed by your health care provider. This is important. Contact a health care provider if:  You have constant pain.  You have trouble or pain with  swallowing.  You have worsening diarrhea. Get help right away if:  You have severe and worsening abdominal pain.  You have diarrhea and: ? You have a rash, stiff neck, or severe headache. ? You are irritable, sleepy, or difficult to awaken. ? You are weak, dizzy, or extremely thirsty.  You have bright red blood in your stool or you have black tarry stools.  You have unusual abdominal swelling that is painful.  You vomit continuously.  You vomit blood (hematemesis).  You have both abdominal pain and a fever. This information is not intended to replace advice given to you by your health care provider. Make sure you discuss any questions you have with your health care provider. Document Released: 11/14/2005 Document Revised: 04/15/2016 Document Reviewed: 08/01/2014 Elsevier Interactive Patient Education  2018 [place] Astr[place]ent cleansing after bowel movement.  Consider taking Tylenol twice a day

## 2017-07-20 NOTE — Assessment & Plan Note (Signed)
sees only a small amount on tissue every 3 rd day or so and usually after a hard stool or loose stool [place] Astrngent cleansing after BM report if worsens

## 2017-07-20 NOTE — Assessment & Plan Note (Signed)
Tolerating statin, encouraged heart healthy diet, avoid trans fats, minimize simple carbs and saturated fats. Increase exercise as tolerated 

## 2017-07-20 NOTE — Progress Notes (Signed)
Subjective:  I acted as a Education administrator for Dr. Charlett Blake. [place], [place]  Patient ID: Daniel Reeves, male    DOB: Jun 12, 1924, 83 y.o.   MRN: [id number]  Chief Complaint  Patient presents with  . Follow-up    Patient is here today for a 4 month F/U. He also states that he is having right foot swelling on the top for about 6 months.    HPI  Patient is in today for a 4 month follow up. He is following up on his hypertension, and other medical concerns. His only note is that he continues to have some bowel irregularities. One day normal and the next day, small hard movement followed by diarrhea. If he has more than one movement he is likely to have blood on the tissue when he wipes. No recent febrile illness or hospitalization. Denies CP/palp/SOB/HA/congestion/fevers or GU c/o. Taking meds as prescribed  Patient Care Team: [name], MD as PCP - General (Family Medicine) Irine Seal, MD as Consulting Physician (Urology) Stanford Breed Denice Bors, MD as Consulting Physician (Cardiology) Iona Beard, MD as Consulting Physician (Optometry) Arlyn Leak, MD (Dentistry)   Past Medical History:  Diagnosis Date  . Abdominal aortic aneurysm ([place])    a. [place] (1/14):  3.3 x 3.4 cm => f/u 11/2013  . Amebic dysentery   . AMEBIC DYSENTERY 11/19/2007   Qualifier: History of  By: Lenna Gilford MD, Deborra Medina   . Anemia 03/07/2017  . Anxiety   . ANXIETY 11/19/2007   Qualifier: Diagnosis of  By: Lenna Gilford MD, Deborra Medina   . Arthritis 03/07/2017  . 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 BACK PAIN, LUMBAR 11/16/2007   Qualifier: Diagnosis of  By: Julien Girt CMA, Leigh    . Benign prostatic hypertrophy   . BENIGN PROSTATIC HYPERTROPHY, HX OF 11/16/2007   Qualifier: Diagnosis of  By: Julien Girt CMA, Leigh    . BRBPR (bright red blood per rectum) 11/01/2016  . CAD (coronary artery disease)    a. s/p CABG in 1979 and 1993;  b. LHC (5/14):  LM, LAD, CFX and RCA occluded; L-LAD ok,  dLAD occluded after insertion of LIMA, S-OM occluded, S-PDA/AM 80-90 => PCI with Promus DES; EF 25%  . Cardiomyopathy, ischemic 06/05/2013  . Cerumen impaction    Bilateral  . CERUMEN IMPACTION, BILATERAL 02/03/2010   Qualifier: History of  By: Lenna Gilford MD, Deborra Medina   . Chicken pox as a child  . Chronic systolic CHF (congestive heart failure) ([place])   . COLONIC POLYPS 07/01/2008   Qualifier: Diagnosis of  By: Lenna Gilford MD, Deborra Medina   . Degenerative joint disease   . DEGENERATIVE JOINT DISEASE 11/16/2007   Qualifier: Diagnosis of  By: Julien Girt CMA, Marliss Czar    . Diverticulosis of colon   . DIVERTICULOSIS OF COLON 07/01/2008   Qualifier: Diagnosis of  By: Lenna Gilford MD, Deborra Medina   . Double vision 03/07/2017  . Essential hypertension 11/16/2007   Qualifier: Diagnosis of  By: Julien Girt CMA, Marliss Czar    . Fingernail abnormalities 03/07/2017  . FLANK PAIN, RIGHT 02/03/2010   Qualifier: History of  By: Lenna Gilford MD, Deborra Medina   . GERD 11/16/2007   Qualifier: Diagnosis of  By: Julien Girt CMA, Marliss Czar    . GERD (gastroesophageal reflux disease)   . Gout   . GOUT 11/16/2007   Qualifier: Diagnosis of  By: Julien Girt CMA, Marliss Czar    . Hearing loss 11/24/2014  . Heart murmur   .  Hypercholesterolemia   . HYPERCHOLESTEROLEMIA 11/16/2007   Qualifier: Diagnosis of  By: Julien Girt CMA, Marliss Czar    . Hypertension   . Ischemic cardiomyopathy    a. echo (09/05/13): EF 35%, diffuse HK worsened distal septal, mid/distal inferior and apical region, grade 1 diastolic dysfunction, mild LAE.    [name] Kidney stone 08/17/2011  . Loss of hearing   . Lumbar back pain   . Measles as a child  . Medicare annual wellness visit, subsequent 11/24/2014   Sees Dr Delman Cheadle for dermatology Sees Dr Roni Bread of Urology Sees Dr Stanford Breed of cardiology Sees Dr Virginia Rochester of Opthamology No further colonoscopies warranted       . Mumps as a child  . Nephrolithiasis   . Pain in joint, lower leg 07/29/2014  . PERIPHERAL VASCULAR DISEASE 11/16/2007   Qualifier: Diagnosis of  By: Julien Girt CMA, Marliss Czar    .  Peripheral vascular disease ([place])   . Rectal bleeding 03/07/2017  . Shingles 07/29/2014  . Sun-damaged skin 05/31/2014    Past Surgical History:  Procedure Laterality Date  . CORONARY ANGIOPLASTY WITH STENT PLACEMENT  04/18/2013   RCA       . CORONARY ARTERY BYPASS GRAFT  1979   x4 SVG-DIAG-LAD, SVG-OM-PDA  . CORONARY ARTERY BYPASS GRAFT  1993   Redo x5 by Dr Harlow Asa; [facility], SVG-OM, SVG-AM-PL  . Decompressive laminectomy  01/2006   L2 - scarum by Dr. Shellia Carwin  . HEMORRHOID SURGERY     fissure with hemorrhoid corrected at age 19  . INGUINAL HERNIA REPAIR  1994   Right by Dr Harlow Asa  . INGUINAL HERNIA REPAIR  1996   Left by Dr. Harlow Asa  . LEFT HEART CATHETERIZATION WITH CORONARY ANGIOGRAM N/A 09/23/2013   Procedure: LEFT HEART CATHETERIZATION WITH CORONARY ANGIOGRAM;  Surgeon: Blane Ohara, MD;  Location: [facility] CATH LAB;  Service: Cardiovascular;  Laterality: N/A;  . PERCUTANEOUS CORONARY STENT INTERVENTION (PCI-S) N/A 04/18/2013   Procedure: PERCUTANEOUS CORONARY STENT INTERVENTION (PCI-S);  Surgeon: Sherren Mocha, MD;  Location: [facility] CATH LAB;  Service: Cardiovascular;  Laterality: N/A;  . TONSILLECTOMY      Family History  Problem Relation Age of Onset  . Parkinsonism Brother   . Diabetes Maternal Grandmother   . Depression Daughter   . Other Son        4 stents  . Heart disease Son   . Diabetes Son        type 2    Social History   Social History  . Marital status: Married    Spouse name: [name] x 64 yrs  . Number of children: N/A  . Years of education: N/A   Occupational History  . 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 Smoking status: Former Smoker    Quit date: 11/28/1944  . Smokeless tobacco: Never Used  . Alcohol use 7.0 oz/week    14 Standard drinks or equivalent per week     Comment: daily rum  or wine  . Drug use: No  . Sexual activity: Not Currently     Comment: lives with wife, no dietary restrictions.     Other Topics Concern  . Not on file   Social History Narrative   Married   7 children    Outpatient Medications Prior to Visit  Medication Sig Dispense Refill  . allopurinol (ZYLOPRIM) 300 MG tablet Take 1 tablet (300 mg total) by mouth daily. 90 tablet 1  . aspirin EC  81 MG tablet Take 81 mg by mouth every morning.     . B Complex-C (B-COMPLEX WITH VITAMIN C) tablet Take 1 tablet by mouth daily.    . Cholecalciferol (VITAMIN D-3 PO) Take 5,000 Units by mouth daily with breakfast.     . finasteride (PROSCAR) 5 MG tablet Takes every third day    . folic acid (FOLVITE) 921 MCG tablet Take 400 mcg by mouth 2 (two) times daily.     . isosorbide mononitrate (IMDUR) 30 MG 24 hr tablet Take 3 tablets (90 mg total) by mouth daily. 270 tablet 3  . losartan (COZAAR) 50 MG tablet Take 1 tablet (50 mg total) by mouth daily. 90 tablet 3  . metoprolol succinate (TOPROL-XL) 25 MG 24 hr tablet Take 0.5 tablets (12.5 mg total) by mouth daily. 45 tablet 3  . Misc Natural Products (OSTEO BI-FLEX ADV JOINT SHIELD) TABS Take 1 tablet by mouth 2 (two) times daily.     . nitroGLYCERIN (NITROSTAT) 0.4 MG SL tablet Place 1 tablet (0.4 mg total) under the tongue every 5 (five) minutes as needed. For chest pain. 25 tablet 6  . simvastatin (ZOCOR) 40 MG tablet TAKE 1 BY MOUTH EVERY EVENING 90 tablet 3  . hydrocortisone (ANUSOL-HC) 25 MG suppository Place 1 suppository (25 mg total) rectally 2 (two) times daily. 12 suppository 0   No facility-administered medications prior to visit.     Allergies  Allergen Reactions  . Lisinopril     REACTION: dizziness  . Methocarbamol     REACTION: pt states "dizzy"  . Pregabalin     REACTION: pt states "dizzy"  . Ramipril     REACTION: hives and dizziness    Review of Systems  Constitutional: Negative for fever and malaise/fatigue.  HENT: Negative for congestion.   Eyes: Negative for blurred vision.  Respiratory: Negative for cough and shortness of breath.    Cardiovascular: Negative for chest pain, palpitations and leg swelling.  Gastrointestinal: Positive for blood in stool, constipation and diarrhea. Negative for abdominal pain, melena, nausea and vomiting.  Musculoskeletal: Negative for back pain.  Skin: Negative for rash.  Neurological: Negative for loss of consciousness and headaches.       Objective:    Physical Exam  Constitutional: He is oriented to person, place, and time. He appears well-developed and well-nourished. No distress.  HENT:  Head: Normocephalic and atraumatic.  Eyes: Conjunctivae are normal.  Neck: Normal range of motion. No thyromegaly present.  Cardiovascular: Normal rate and regular rhythm.   Pulmonary/Chest: Effort normal and breath sounds normal. He has no wheezes.  Abdominal: Soft. Bowel sounds are normal. There is no tenderness.  Musculoskeletal: Normal range of motion. He exhibits no edema or deformity.  Neurological: He is alert and oriented to person, place, and time.  Skin: Skin is warm and dry. He is not diaphoretic.  Psychiatric: He has a normal mood and affect.    BP 124/64 (BP Location: Left Arm, Patient Position: Sitting, Cuff Size: Large)   Pulse (!) 54   Temp 97.8 F (36.6 C) (Oral)   Ht 6' (1.829 m)   Wt 174 lb 12.8 oz (79.3 kg)   SpO2 97%   BMI 23.71 kg/m  Wt Readings from Last 3 Encounters:  07/20/17 174 lb 12.8 oz (79.3 kg)  03/22/17 176 lb (79.8 kg)  03/07/17 175 lb 3.2 oz (79.5 kg)   BP Readings from Last 3 Encounters:  07/20/17 124/64  03/22/17 (!) 148/70  03/07/17 (!) 115/55  Immunization History  Administered Date(s) Administered  . Influenza, High Dose Seasonal PF 08/25/2016  . Influenza,inj,Quad PF,6+ Mos 10/10/2013, 11/24/2014, 11/16/2015  . Pneumococcal Conjugate-13 11/16/2015  . Pneumococcal Polysaccharide-23 07/11/2013  . Tdap 07/11/2013  . Zoster 12/17/2015    Health Maintenance  Topic Date Due  . INFLUENZA VACCINE  06/28/2017  . TETANUS/TDAP   07/12/2023  . PNA vac Low Risk Adult  Completed    Lab Results  Component Value Date   WBC 7.6 03/07/2017   HGB 13.3 03/07/2017   HCT 39.5 03/07/2017   PLT 201.0 03/07/2017   GLUCOSE 97 03/07/2017   CHOL 91 03/07/2017   TRIG 85.0 03/07/2017   HDL 33.10 (L) 03/07/2017   LDLCALC 41 03/07/2017   ALT 19 03/07/2017   AST 27 03/07/2017   NA 138 03/07/2017   K 3.8 03/07/2017   CL 106 03/07/2017   CREATININE 1.06 03/07/2017   BUN 19 03/07/2017   CO2 26 03/07/2017   TSH 2.51 03/07/2017   PSA 4.29 (H) 02/08/2011   INR 1.0 09/17/2013    Lab Results  Component Value Date   TSH 2.51 03/07/2017   Lab Results  Component Value Date   WBC 7.6 03/07/2017   HGB 13.3 03/07/2017   HCT 39.5 03/07/2017   MCV 99.1 03/07/2017   PLT 201.0 03/07/2017   Lab Results  Component Value Date   NA 138 03/07/2017   K 3.8 03/07/2017   CO2 26 03/07/2017   GLUCOSE 97 03/07/2017   BUN 19 03/07/2017   CREATININE 1.06 03/07/2017   BILITOT 1.1 03/07/2017   ALKPHOS 60 03/07/2017   AST 27 03/07/2017   ALT 19 03/07/2017   PROT 6.7 03/07/2017   ALBUMIN 3.9 03/07/2017   CALCIUM 9.4 03/07/2017   GFR 69.62 03/07/2017   Lab Results  Component Value Date   CHOL 91 03/07/2017   Lab Results  Component Value Date   HDL 33.10 (L) 03/07/2017   Lab Results  Component Value Date   LDLCALC 41 03/07/2017   Lab Results  Component Value Date   TRIG 85.0 03/07/2017   Lab Results  Component Value Date   CHOLHDL 3 03/07/2017   No results found for: HGBA1C       Assessment & Plan:   Problem List Items Addressed This Visit    HYPERCHOLESTEROLEMIA    Tolerating statin, encouraged heart healthy diet, avoid trans fats, minimize simple carbs and saturated fats. Increase exercise as tolerated      Essential hypertension    Denies CP/palp/SOB/HA/congestion/fevers/GI or GU c/o. Taking meds as prescribed      BRBPR (bright red blood per rectum)    sees only a small amount on tissue every 3 rd day  or so and usually after a hard stool or loose stool Witch Hazel Astrngent cleansing after BM report if worsens         I have discontinued Mr. Cavitt hydrocortisone. I am also having him maintain his folic acid, OSTEO BI-FLEX ADV JOINT SHIELD, Cholecalciferol (VITAMIN D-3 PO), aspirin EC, finasteride, B-complex with vitamin C, nitroGLYCERIN, metoprolol succinate, simvastatin, allopurinol, losartan, and isosorbide mononitrate.  No orders of the defined types were placed in this encounter.   CMA served as Education administrator during this visit. History, Physical and Plan performed by medical provider. Documentation and orders reviewed and attested to.  Penni Homans, MD

## 2017-07-20 NOTE — Assessment & Plan Note (Signed)
Denies CP/palp/SOB/HA/congestion/fevers/GI or GU c/o. Taking meds as prescribed 

## 2017-07-30 ENCOUNTER — Other Ambulatory Visit: Payer: Self-pay | Admitting: Family Medicine

## 2017-08-07 DIAGNOSIS — M9904 Segmental and somatic dysfunction of sacral region: Secondary | ICD-10-CM | POA: Diagnosis not present

## 2017-08-07 DIAGNOSIS — M9902 Segmental and somatic dysfunction of thoracic region: Secondary | ICD-10-CM | POA: Diagnosis not present

## 2017-08-07 DIAGNOSIS — M9901 Segmental and somatic dysfunction of cervical region: Secondary | ICD-10-CM | POA: Diagnosis not present

## 2017-08-07 DIAGNOSIS — M545 Low back pain: Secondary | ICD-10-CM | POA: Diagnosis not present

## 2017-08-08 DIAGNOSIS — M5416 Radiculopathy, lumbar region: Secondary | ICD-10-CM | POA: Diagnosis not present

## 2017-08-10 DIAGNOSIS — R079 Chest pain, unspecified: Secondary | ICD-10-CM | POA: Diagnosis not present

## 2017-08-18 ENCOUNTER — Other Ambulatory Visit: Payer: Self-pay | Admitting: Family Medicine

## 2017-08-24 DIAGNOSIS — M4807 Spinal stenosis, lumbosacral region: Secondary | ICD-10-CM | POA: Diagnosis not present

## 2017-08-24 DIAGNOSIS — M5136 Other intervertebral disc degeneration, lumbar region: Secondary | ICD-10-CM | POA: Diagnosis not present

## 2017-09-04 DIAGNOSIS — Z23 Encounter for immunization: Secondary | ICD-10-CM | POA: Diagnosis not present

## 2017-09-11 DIAGNOSIS — M545 Low back pain: Secondary | ICD-10-CM | POA: Diagnosis not present

## 2017-09-11 DIAGNOSIS — M9902 Segmental and somatic dysfunction of thoracic region: Secondary | ICD-10-CM | POA: Diagnosis not present

## 2017-09-11 DIAGNOSIS — M9904 Segmental and somatic dysfunction of sacral region: Secondary | ICD-10-CM | POA: Diagnosis not present

## 2017-09-11 DIAGNOSIS — M9901 Segmental and somatic dysfunction of cervical region: Secondary | ICD-10-CM | POA: Diagnosis not present

## 2017-10-02 DIAGNOSIS — Z85828 Personal history of other malignant neoplasm of skin: Secondary | ICD-10-CM | POA: Diagnosis not present

## 2017-10-02 DIAGNOSIS — L820 Inflamed seborrheic keratosis: Secondary | ICD-10-CM | POA: Diagnosis not present

## 2017-10-02 DIAGNOSIS — Z08 Encounter for follow-up examination after completed treatment for malignant neoplasm: Secondary | ICD-10-CM | POA: Diagnosis not present

## 2017-10-02 DIAGNOSIS — L570 Actinic keratosis: Secondary | ICD-10-CM | POA: Diagnosis not present

## 2017-10-09 DIAGNOSIS — M545 Low back pain: Secondary | ICD-10-CM | POA: Diagnosis not present

## 2017-10-09 DIAGNOSIS — M9902 Segmental and somatic dysfunction of thoracic region: Secondary | ICD-10-CM | POA: Diagnosis not present

## 2017-10-09 DIAGNOSIS — M9901 Segmental and somatic dysfunction of cervical region: Secondary | ICD-10-CM | POA: Diagnosis not present

## 2017-10-09 DIAGNOSIS — M9904 Segmental and somatic dysfunction of sacral region: Secondary | ICD-10-CM | POA: Diagnosis not present

## 2017-10-17 ENCOUNTER — Ambulatory Visit: Payer: Medicare Other | Admitting: Family Medicine

## 2017-10-25 DIAGNOSIS — N401 Enlarged prostate with lower urinary tract symptoms: Secondary | ICD-10-CM | POA: Diagnosis not present

## 2017-10-25 DIAGNOSIS — R351 Nocturia: Secondary | ICD-10-CM | POA: Diagnosis not present

## 2017-10-25 DIAGNOSIS — K648 Other hemorrhoids: Secondary | ICD-10-CM | POA: Diagnosis not present

## 2017-10-27 ENCOUNTER — Ambulatory Visit (INDEPENDENT_AMBULATORY_CARE_PROVIDER_SITE_OTHER): Payer: Medicare Other | Admitting: Family Medicine

## 2017-10-27 ENCOUNTER — Encounter: Payer: Self-pay | Admitting: Family Medicine

## 2017-10-27 DIAGNOSIS — E7800 Pure hypercholesterolemia, unspecified: Secondary | ICD-10-CM | POA: Diagnosis not present

## 2017-10-27 DIAGNOSIS — I255 Ischemic cardiomyopathy: Secondary | ICD-10-CM | POA: Diagnosis not present

## 2017-10-27 DIAGNOSIS — I10 Essential (primary) hypertension: Secondary | ICD-10-CM

## 2017-10-27 DIAGNOSIS — M1710 Unilateral primary osteoarthritis, unspecified knee: Secondary | ICD-10-CM | POA: Diagnosis not present

## 2017-10-27 DIAGNOSIS — M179 Osteoarthritis of knee, unspecified: Secondary | ICD-10-CM

## 2017-10-27 DIAGNOSIS — M109 Gout, unspecified: Secondary | ICD-10-CM | POA: Diagnosis not present

## 2017-10-27 DIAGNOSIS — Z8619 Personal history of other infectious and parasitic diseases: Secondary | ICD-10-CM

## 2017-10-27 DIAGNOSIS — K649 Unspecified hemorrhoids: Secondary | ICD-10-CM | POA: Diagnosis not present

## 2017-10-27 DIAGNOSIS — H524 Presbyopia: Secondary | ICD-10-CM | POA: Diagnosis not present

## 2017-10-27 DIAGNOSIS — H353131 Nonexudative age-related macular degeneration, bilateral, early dry stage: Secondary | ICD-10-CM | POA: Diagnosis not present

## 2017-10-27 HISTORY — DX: Personal history of other infectious and parasitic diseases: Z86.19

## 2017-10-27 NOTE — Assessment & Plan Note (Signed)
Uric acid check

## 2017-10-27 NOTE — Patient Instructions (Signed)
Witch Hazel Astringent can clean the hemorrhoids then use hemorrhoid cream   Miralax and Benefiber mixed together once or twice a day  Hemorrhoids Hemorrhoids are swollen veins in and around the rectum or anus. Hemorrhoids can cause pain, itching, or bleeding. Most of the time, they do not cause serious problems. They usually get better with diet changes, lifestyle changes, and other home treatments. Follow these instructions at home: Eating and drinking  Eat foods that have fiber, such as whole grains, beans, nuts, fruits, and vegetables. Ask your doctor about taking products that have added fiber (fibersupplements).  Drink enough fluid to keep your pee (urine) clear or pale yellow. For Pain and Swelling  Take a warm-water bath (sitz bath) for 20 minutes to ease pain. Do this 3-4 times a day.  If directed, put ice on the painful area. It may be helpful to use ice between your warm baths. ? Put ice in a plastic bag. ? Place a towel between your skin and the bag. ? Leave the ice on for 20 minutes, 2-3 times a day. General instructions  Take over-the-counter and prescription medicines only as told by your doctor. ? Medicated creams and medicines that are inserted into the anus (suppositories) may be used or applied as told.  Exercise often.  Go to the bathroom when you have the urge to poop (to have a bowel movement). Do not wait.  Avoid pushing too hard (straining) when you poop.  Keep the butt area dry and clean. Use wet toilet paper or moist paper towels.  Do not sit on the toilet for a long time. Contact a doctor if:  You have any of these: ? Pain and swelling that do not get better with treatment or medicine. ? Bleeding that will not stop. ? Trouble pooping or you cannot poop. ? Pain or swelling outside the area of the hemorrhoids. This information is not intended to replace advice given to you by your health care provider. Make sure you discuss any questions you have  with your health care provider. Document Released: 08/23/2008 Document Revised: 04/21/2016 Document Reviewed: 07/29/2015 Elsevier Interactive Patient Education  2018 [employer].  Try mixing Miralax and Benefiber powder in liquid and drink once or twice daily

## 2017-10-27 NOTE — Assessment & Plan Note (Signed)
Encouraged Shingrix shot.

## 2017-10-27 NOTE — Assessment & Plan Note (Signed)
Well controlled, no changes to meds. Encouraged heart healthy diet such as the DASH diet and exercise as tolerated.  °

## 2017-10-27 NOTE — Progress Notes (Signed)
Subjective:  I acted as a Education administrator for [employer]. [name], Daniel Reeves   Patient ID: Daniel Reeves, male    DOB: 02-19-1924, 83 y.o.   MRN: [id number]  Chief Complaint  Patient presents with  . Follow-up    HPI  Patient is in today for follow visit. He is accompanied by his wife. He is feeling well today. Has trouble with mild constipation at itmes. Going as long as 5 days between visits. No straining but does note some losse stool up to 5 in a day at times. Has some rectal bright red blood on tissue sometimes after several loose stool. No loss of appetite or nausea or vomiting. Stressed over leaking pipes at work. Denies CP/palp/SOB/HA/fevers or GU c/o. Taking meds as prescribed. Has had a couple days of head congestion as well. No green sputum.   Patient Care Team: [name], MD as PCP - General (Family Medicine) Irine Seal, MD as Consulting Physician (Urology) Stanford Breed Denice Bors, MD as Consulting Physician (Cardiology) Iona Beard, [place] as Consulting Physician (Optometry) Arlyn Leak, MD (Dentistry)   Past Medical History:  Diagnosis Date  . Abdominal aortic aneurysm ([place])    a. [place] (1/14):  3.3 x 3.4 cm => f/u 11/2013  . Amebic dysentery   . AMEBIC DYSENTERY 11/19/2007   Qualifier: History of  By: Lenna Gilford MD, Deborra Medina   . Anemia 03/07/2017  . Anxiety   . ANXIETY 11/19/2007   Qualifier: Diagnosis of  By: Lenna Gilford MD, Deborra Medina   . Arthritis 03/07/2017  . Atherosclerosis of coronary artery bypass graft with unstable angina pectoris ([place]) 04/19/2013  . BACK PAIN, LUMBAR 11/16/2007   Qualifier: Diagnosis of  By: Julien Girt CMA, Leigh    . Benign prostatic hypertrophy   . BENIGN PROSTATIC HYPERTROPHY, HX OF 11/16/2007   Qualifier: Diagnosis of  By: Julien Girt CMA, Leigh    . BRBPR (bright red blood per rectum) 11/01/2016  . CAD (coronary artery disease)    a. s/p CABG in 1979 and 1993;  b. LHC (5/14):  LM, LAD, CFX and RCA occluded; L-LAD ok, dLAD occluded after insertion of LIMA, S-OM  occluded, S-PDA/AM 80-90 => PCI with Promus DES; EF 25%  . Cardiomyopathy, ischemic 06/05/2013  . Cerumen impaction    Bilateral  . CERUMEN IMPACTION, BILATERAL 02/03/2010   Qualifier: History of  By: Lenna Gilford MD, Deborra Medina   . Chicken pox as a child  . Chronic systolic CHF (congestive heart failure) ([place])   . COLONIC POLYPS 07/01/2008   Qualifier: Diagnosis of  By: Lenna Gilford MD, Deborra Medina   . Degenerative joint disease   . DEGENERATIVE JOINT DISEASE 11/16/2007   Qualifier: Diagnosis of  By: Julien Girt CMA, Marliss Czar    . Diverticulosis of colon   . DIVERTICULOSIS OF COLON 07/01/2008   Qualifier: Diagnosis of  By: Lenna Gilford MD, Deborra Medina   . Double vision 03/07/2017  . Essential hypertension 11/16/2007   Qualifier: Diagnosis of  By: Julien Girt CMA, Marliss Czar    . Fingernail abnormalities 03/07/2017  . FLANK PAIN, RIGHT 02/03/2010   Qualifier: History of  By: Lenna Gilford MD, Deborra Medina   . GERD 11/16/2007   Qualifier: Diagnosis of  By: Julien Girt CMA, Marliss Czar    . GERD (gastroesophageal reflux disease)   . Gout   . GOUT 11/16/2007   Qualifier: Diagnosis of  By: Julien Girt CMA, Marliss Czar    . Hearing loss 11/24/2014  . Heart murmur   . History of shingles 10/27/2017  . Hypercholesterolemia   .  HYPERCHOLESTEROLEMIA 11/16/2007   Qualifier: Diagnosis of  By: Julien Girt CMA, Marliss Czar    . Hypertension   . Ischemic cardiomyopathy    a. echo (09/05/13): EF 35%, diffuse HK worsened distal septal, mid/distal inferior and apical region, grade 1 diastolic dysfunction, mild LAE.    [name] Kidney stone 08/17/2011  . Loss of hearing   . Lumbar back pain   . Measles as a child  . Medicare annual wellness visit, subsequent 11/24/2014   Sees Dr Delman Cheadle for dermatology Sees Dr Roni Bread of Urology Sees Dr Stanford Breed of cardiology Sees Dr Virginia Rochester of Opthamology No further colonoscopies warranted       . Mumps as a child  . Nephrolithiasis   . Pain in joint, lower leg 07/29/2014  . PERIPHERAL VASCULAR DISEASE 11/16/2007   Qualifier: Diagnosis of  By: Julien Girt CMA, Marliss Czar    .  Peripheral vascular disease ([place])   . Rectal bleeding 03/07/2017  . Shingles 07/29/2014  . Sun-damaged skin 05/31/2014    Past Surgical History:  Procedure Laterality Date  . CORONARY ANGIOPLASTY WITH STENT PLACEMENT  04/18/2013   RCA       . CORONARY ARTERY BYPASS GRAFT  1979   x4 SVG-DIAG-LAD, SVG-OM-PDA  . CORONARY ARTERY BYPASS GRAFT  1993   Redo x5 by Dr Harlow Asa; [facility], SVG-OM, SVG-AM-PL  . Decompressive laminectomy  01/2006   L2 - scarum by Dr. Shellia Carwin  . HEMORRHOID SURGERY     fissure with hemorrhoid corrected at age 31  . INGUINAL HERNIA REPAIR  1994   Right by Dr Harlow Asa  . INGUINAL HERNIA REPAIR  1996   Left by Dr. Harlow Asa  . LEFT HEART CATHETERIZATION WITH CORONARY ANGIOGRAM N/A 09/23/2013   Procedure: LEFT HEART CATHETERIZATION WITH CORONARY ANGIOGRAM;  Surgeon: Blane Ohara, MD;  Location: [facility] CATH LAB;  Service: Cardiovascular;  Laterality: N/A;  . PERCUTANEOUS CORONARY STENT INTERVENTION (PCI-S) N/A 04/18/2013   Procedure: PERCUTANEOUS CORONARY STENT INTERVENTION (PCI-S);  Surgeon: Sherren Mocha, MD;  Location: [facility] CATH LAB;  Service: Cardiovascular;  Laterality: N/A;  . TONSILLECTOMY      Family History  Problem Relation Age of Onset  . Parkinsonism Brother   . Diabetes Maternal Grandmother   . Depression Daughter   . Other Son        4 stents  . Heart disease Son   . Diabetes Son        type 2    Social History   Socioeconomic History  . Marital status: Married    Spouse name: [name] x 64 yrs  . Number of children: Not on file  . Years of education: Not on file  . Highest education level: Not on file  Social Needs  . Financial resource strain: Not on file  . Food insecurity - worry: Not on file  . Food insecurity - inability: Not on file  . Transportation needs - medical: Not on file  . Transportation needs - non-medical: Not on file  Occupational History  . 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 Smoking  status: Former Smoker    Last attempt to quit: 11/28/1944    Years since quitting: 72.9  . Smokeless tobacco: Never Used  Substance and Sexual Activity  . Alcohol use: Yes    Alcohol/week: 7.0 oz    Types: 14 Standard drinks or equivalent per week    Comment: daily rum  or wine  . Drug use: No  . Sexual activity: Not Currently  Comment: lives with wife, no dietary restrictions.   Other Topics Concern  . Not on file  Social History Narrative   Married   7 children    Outpatient Medications Prior to Visit  Medication Sig Dispense Refill  . allopurinol (ZYLOPRIM) 300 MG tablet TAKE 1 TABLET(300 MG) BY MOUTH DAILY 90 tablet 0  . aspirin EC 81 MG tablet Take 81 mg by mouth every morning.     . B Complex-C (B-COMPLEX WITH VITAMIN C) tablet Take 1 tablet by mouth daily.    . Cholecalciferol (VITAMIN D-3 PO) Take 5,000 Units by mouth daily with breakfast.     . finasteride (PROSCAR) 5 MG tablet Takes every third day    . folic acid (FOLVITE) 034 MCG tablet Take 400 mcg by mouth 2 (two) times daily.     . isosorbide mononitrate (IMDUR) 30 MG 24 hr tablet Take 3 tablets (90 mg total) by mouth daily. 270 tablet 3  . losartan (COZAAR) 50 MG tablet Take 1 tablet (50 mg total) by mouth daily. 90 tablet 3  . metoprolol succinate (TOPROL-XL) 25 MG 24 hr tablet Take 0.5 tablets (12.5 mg total) by mouth daily. 45 tablet 3  . Misc Natural Products (OSTEO BI-FLEX ADV JOINT SHIELD) TABS Take 1 tablet by mouth 2 (two) times daily.     . nitroGLYCERIN (NITROSTAT) 0.4 MG SL tablet Place 1 tablet (0.4 mg total) under the tongue every 5 (five) minutes as needed. For chest pain. 25 tablet 6  . simvastatin (ZOCOR) 40 MG tablet TAKE 1 BY MOUTH EVERY EVENING 90 tablet 3   No facility-administered medications prior to visit.     Allergies  Allergen Reactions  . Lisinopril     REACTION: dizziness  . Methocarbamol     REACTION: pt states "dizzy"  . Pregabalin     REACTION: pt states "dizzy"  . Ramipril       REACTION: hives and dizziness    Review of Systems  Constitutional: Negative for fever and malaise/fatigue.  HENT: Positive for congestion.   Eyes: Negative for blurred vision.  Respiratory: Negative for shortness of breath.   Cardiovascular: Negative for chest pain, palpitations and leg swelling.  Gastrointestinal: Positive for blood in stool and constipation. Negative for abdominal pain, diarrhea, melena and nausea.  Genitourinary: Negative for dysuria and frequency.  Musculoskeletal: Negative for falls.  Skin: Negative for rash.  Neurological: Negative for dizziness, loss of consciousness and headaches.  Endo/Heme/Allergies: Negative for environmental allergies.  Psychiatric/Behavioral: Negative for depression. The patient is not nervous/anxious.        Objective:    Physical Exam  Constitutional: He is oriented to person, place, and time. He appears well-developed and well-nourished. No distress.  HENT:  Head: Normocephalic and atraumatic.  Nose: Nose normal.  Eyes: Right eye exhibits no discharge. Left eye exhibits no discharge.  Neck: Normal range of motion. Neck supple.  Cardiovascular: Normal rate and regular rhythm.  No murmur heard. Pulmonary/Chest: Effort normal and breath sounds normal.  Abdominal: Soft. Bowel sounds are normal. There is no tenderness.  Musculoskeletal: He exhibits no edema.  Neurological: He is alert and oriented to person, place, and time.  Skin: Skin is warm and dry.  Psychiatric: He has a normal mood and affect.  Nursing note and vitals reviewed.   BP (!) 116/52 (BP Location: Left Arm, Patient Position: Sitting, Cuff Size: Normal)   Pulse 60   Temp 97.9 F (36.6 C) (Oral)   Resp 16   Ht  6' 0.05" (1.83 m)   Wt 173 lb (78.5 kg)   SpO2 98%   BMI 23.43 kg/m  Wt Readings from Last 3 Encounters:  10/27/17 173 lb (78.5 kg)  07/20/17 174 lb 12.8 oz (79.3 kg)  03/22/17 176 lb (79.8 kg)   BP Readings from Last 3 Encounters:  10/27/17  (!) 116/52  07/20/17 124/64  03/22/17 (!) 148/70     Immunization History  Administered Date(s) Administered  . Influenza, High Dose Seasonal PF 08/25/2016, 09/04/2017  . Influenza,inj,Quad PF,6+ Mos 10/10/2013, 11/24/2014, 11/16/2015  . Pneumococcal Conjugate-13 11/16/2015  . Pneumococcal Polysaccharide-23 07/11/2013  . Tdap 07/11/2013  . Zoster 12/17/2015    Health Maintenance  Topic Date Due  . Samul Dada  07/12/2023  . INFLUENZA VACCINE  Completed  . PNA vac Low Risk Adult  Completed    Lab Results  Component Value Date   WBC 7.8 10/27/2017   HGB 13.0 (L) 10/27/2017   HCT 37.6 (L) 10/27/2017   PLT 210 10/27/2017   GLUCOSE 132 (H) 10/27/2017   CHOL 85 10/27/2017   TRIG 102 10/27/2017   HDL 32 (L) 10/27/2017   LDLCALC 41 03/07/2017   ALT 19 10/27/2017   AST 27 10/27/2017   NA 142 10/27/2017   K 4.7 10/27/2017   CL 110 10/27/2017   CREATININE 1.07 10/27/2017   BUN 22 10/27/2017   CO2 24 10/27/2017   TSH 1.70 10/27/2017   PSA 4.29 (H) 02/08/2011   INR 1.0 09/17/2013    Lab Results  Component Value Date   TSH 1.70 10/27/2017   Lab Results  Component Value Date   WBC 7.8 10/27/2017   HGB 13.0 (L) 10/27/2017   HCT 37.6 (L) 10/27/2017   MCV 95.7 10/27/2017   PLT 210 10/27/2017   Lab Results  Component Value Date   NA 142 10/27/2017   K 4.7 10/27/2017   CO2 24 10/27/2017   GLUCOSE 132 (H) 10/27/2017   BUN 22 10/27/2017   CREATININE 1.07 10/27/2017   BILITOT 1.2 10/27/2017   ALKPHOS 60 03/07/2017   AST 27 10/27/2017   ALT 19 10/27/2017   PROT 6.2 10/27/2017   ALBUMIN 3.9 03/07/2017   CALCIUM 9.5 10/27/2017   GFR 69.62 03/07/2017   Lab Results  Component Value Date   CHOL 85 10/27/2017   Lab Results  Component Value Date   HDL 32 (L) 10/27/2017   Lab Results  Component Value Date   LDLCALC 41 03/07/2017   Lab Results  Component Value Date   TRIG 102 10/27/2017   Lab Results  Component Value Date   CHOLHDL 2.7 10/27/2017   No  results found for: HGBA1C       Assessment & Plan:   Problem List Items Addressed This Visit    HYPERCHOLESTEROLEMIA    Encouraged heart healthy diet, increase exercise, avoid trans fats, consider a krill oil cap daily. Simvastatin      Relevant Orders   Lipid Profile (Completed)   Gout    Uric acid check      Relevant Orders   Uric acid (Completed)   Essential hypertension    Well controlled, no changes to meds. Encouraged heart healthy diet such as the DASH diet and exercise as tolerated.       Relevant Orders   CBC (Completed)   Comp Met (CMET) (Completed)   TSH (Completed)   Osteoarthritis    Right knee pain and a sense of instability at times. No falls or injury he will  let us know if he is ready for referral. Try Lidocaine gel prn      History of shingles    Encouraged Shingrix shot.      Hemorrhoid    Encouraged increased hydration and fiber in diet. Daily probiotics. If bowels not moving can use MOM 2 tbls po in 4 oz of warm prune juice by mouth every 2-3 days. If no results then repeat in 4 hours with  Dulcolax suppository pr, may repeat again in 4 more hours as needed. Seek care if symptoms worsen. Consider daily Miralax and/or Dulcolax if symptoms persist. Miralax and Benefiber once to twice daily. Use Witch Hazel Astringent prn         I am having Rolly F. Vernon "Dick" maintain his folic acid, OSTEO BI-FLEX ADV JOINT SHIELD, Cholecalciferol (VITAMIN D-3 PO), aspirin EC, finasteride, B-complex with vitamin C, nitroGLYCERIN, metoprolol succinate, simvastatin, losartan, isosorbide mononitrate, and allopurinol.  No orders of the defined types were placed in this encounter.   CMA served as Education administrator during this visit. History, Physical and Plan performed by medical provider. Documentation and orders reviewed and attested to.  Penni Homans, MD

## 2017-10-27 NOTE — Assessment & Plan Note (Signed)
Encouraged heart healthy diet, increase exercise, avoid trans fats, consider a krill oil cap daily. Simvastatin  

## 2017-10-28 LAB — COMPREHENSIVE METABOLIC PANEL
AG RATIO: 1.6 (calc) (ref 1.0–2.5)
ALKALINE PHOSPHATASE (APISO): 71 U/L (ref 40–115)
ALT: 19 U/L (ref 9–46)
AST: 27 U/L (ref 10–35)
Albumin: 3.8 g/dL (ref 3.6–5.1)
BILIRUBIN TOTAL: 1.2 mg/dL (ref 0.2–1.2)
BUN: 22 mg/dL (ref 7–25)
CALCIUM: 9.5 mg/dL (ref 8.6–10.3)
CO2: 24 mmol/L (ref 20–32)
Chloride: 110 mmol/L (ref 98–110)
Creat: 1.07 mg/dL (ref 0.70–1.11)
Globulin: 2.4 g/dL (calc) (ref 1.9–3.7)
Glucose, Bld: 132 mg/dL — ABNORMAL HIGH (ref 65–99)
Potassium: 4.7 mmol/L (ref 3.5–5.3)
SODIUM: 142 mmol/L (ref 135–146)
TOTAL PROTEIN: 6.2 g/dL (ref 6.1–8.1)

## 2017-10-28 LAB — CBC
HEMATOCRIT: 37.6 % — AB (ref 38.5–50.0)
HEMOGLOBIN: 13 g/dL — AB (ref 13.2–17.1)
MCH: 33.1 pg — AB (ref 27.0–33.0)
MCHC: 34.6 g/dL (ref 32.0–36.0)
MCV: 95.7 fL (ref 80.0–100.0)
MPV: 10.6 fL (ref 7.5–12.5)
Platelets: 210 10*3/uL (ref 140–400)
RBC: 3.93 10*6/uL — ABNORMAL LOW (ref 4.20–5.80)
RDW: 12.6 % (ref 11.0–15.0)
WBC: 7.8 10*3/uL (ref 3.8–10.8)

## 2017-10-28 LAB — LIPID PANEL
Cholesterol: 85 mg/dL (ref <200)
HDL: 32 mg/dL — ABNORMAL LOW (ref >40)
LDL Cholesterol (Calc): 34 mg/dL (calc)
Non-HDL Cholesterol (Calc): 53 mg/dL (calc) (ref <130)
TRIGLYCERIDES: 102 mg/dL (ref <150)
Total CHOL/HDL Ratio: 2.7 (calc) (ref <5.0)

## 2017-10-28 LAB — TSH: TSH: 1.7 m[IU]/L (ref 0.40–4.50)

## 2017-10-28 LAB — URIC ACID: URIC ACID, SERUM: 3.4 mg/dL — AB (ref 4.0–8.0)

## 2017-10-29 DIAGNOSIS — K649 Unspecified hemorrhoids: Secondary | ICD-10-CM | POA: Insufficient documentation

## 2017-10-29 NOTE — Assessment & Plan Note (Signed)
Encouraged increased hydration and fiber in diet. Daily probiotics. If bowels not moving can use MOM 2 tbls po in 4 oz of warm prune juice by mouth every 2-3 days. If no results then repeat in 4 hours with  Dulcolax suppository pr, may repeat again in 4 more hours as needed. Seek care if symptoms worsen. Consider daily Miralax and/or Dulcolax if symptoms persist. Miralax and Benefiber once to twice daily. Use Witch Hazel Astringent prn

## 2017-10-29 NOTE — Assessment & Plan Note (Signed)
Right knee pain and a sense of instability at times. No falls or injury he will let us know if he is ready for referral. Try Lidocaine gel prn

## 2017-11-02 ENCOUNTER — Other Ambulatory Visit: Payer: Self-pay

## 2017-11-10 DIAGNOSIS — M9902 Segmental and somatic dysfunction of thoracic region: Secondary | ICD-10-CM | POA: Diagnosis not present

## 2017-11-10 DIAGNOSIS — M9904 Segmental and somatic dysfunction of sacral region: Secondary | ICD-10-CM | POA: Diagnosis not present

## 2017-11-10 DIAGNOSIS — M545 Low back pain: Secondary | ICD-10-CM | POA: Diagnosis not present

## 2017-11-10 DIAGNOSIS — M9901 Segmental and somatic dysfunction of cervical region: Secondary | ICD-10-CM | POA: Diagnosis not present

## 2017-12-04 ENCOUNTER — Ambulatory Visit: Payer: Self-pay

## 2017-12-04 NOTE — Telephone Encounter (Signed)
Pt calling to report a brief episode of double vision that occurred this am. Pt was working in yard and was raking leaves and bending over to put them in bags to carry out to road. Pt reports was "unsteady" on his feet but denies lightheadedness or spinning. At time of call pt stated that he can see without any difficulty. Advised pt to call his eye doctor in the am. Advised to call 911 or go to ED if: has another episode of double vision, any numbness or tingling or weakness on one side of the body. Appt made for 12/06/16 @ 1045. Reason for Disposition . Double vision  Answer Assessment - Initial Assessment Questions 1. DESCRIPTION: "What is the vision loss like? Describe it for me." (e.g., complete vision loss, blurred vision, double vision, floaters, etc.)     Double vision left eye 2. LOCATION: "One or both eyes?" If one, ask: "Which eye?"     Left eye 3. SEVERITY: "Can you see anything?" If so, ask: "What can you see?" (e.g., fine print)     Yes can read fine print  4. ONSET: "When did this begin?" "Did it start suddenly or has this been gradual?"     This am. Happened suddenly.  5. PATTERN: "Does this come and go, or has it been constant since it started?"     Was a single episode  6. PAIN: "Is there any pain in your eye(s)?"  (Scale 1-10; or mild, moderate, severe)     no 7. CONTACTS-GLASSES: "Do you wear contacts or glasses?"     Reading glasses  8. CAUSE: "What do you think is causing this visual problem?"    Pt has no idea maybe bending over repeatedly raking leaves and putting in piles 9. OTHER SYMPTOMS: "Do you have any other symptoms?" (e.g., confusion, headache, arm or leg weakness, speech problems)     Unsteady on feet during double vision   BP at 1655: 137/77 HR 61 10. PREGNANCY: "Is there any chance you are pregnant?" "When was your last menstrual period?"       n/a  Protocols used: [place]

## 2017-12-05 DIAGNOSIS — H532 Diplopia: Secondary | ICD-10-CM | POA: Diagnosis not present

## 2017-12-06 ENCOUNTER — Encounter: Payer: Self-pay | Admitting: Family Medicine

## 2017-12-06 ENCOUNTER — Ambulatory Visit (INDEPENDENT_AMBULATORY_CARE_PROVIDER_SITE_OTHER): Payer: Medicare Other | Admitting: Family Medicine

## 2017-12-06 VITALS — BP 120/70 | HR 56 | Temp 98.1°F | Ht 72.0 in | Wt 171.0 lb

## 2017-12-06 DIAGNOSIS — H532 Diplopia: Secondary | ICD-10-CM | POA: Diagnosis not present

## 2017-12-06 NOTE — Progress Notes (Signed)
Pre visit review using our clinic review tool, if applicable. No additional management support is needed unless otherwise documented below in the visit note. 

## 2017-12-06 NOTE — Patient Instructions (Addendum)
Causes of double vision are numerous. Most less concerning causes resolve after a short period of time like your case. Your exam looks nice and normal. If anything changes or if it returns, let me know.   Let us know if you need anything.

## 2017-12-06 NOTE — Progress Notes (Signed)
Chief Complaint  Patient presents with  . Diplopia    Subjective: Patient is a 84 y.o. male here for double vision.  Pt was raking lesaves outside. He had been picking up piles of leaves when it happened. He felt a little unsteady on his feet as well. It lasts for around 1 hr after it happened and he stopped. He has not had return of symptoms since this returned. He saw his eye provider he was told he needs to follow-up with his primary care doctor.  He denies any injury, fevers, recent illness, pain with eye movement, numbness, tingling, or weakness.   ROS: Neuro: As noted in HPI   Past Medical History:  Diagnosis Date  . Abdominal aortic aneurysm ([place])    a. [place] (1/14):  3.3 x 3.4 cm => f/u 11/2013  . Amebic dysentery   . AMEBIC DYSENTERY 11/19/2007   Qualifier: History of  By: Lenna Gilford MD, Deborra Medina   . Anemia 03/07/2017  . Anxiety   . ANXIETY 11/19/2007   Qualifier: Diagnosis of  By: Lenna Gilford MD, Deborra Medina   . Arthritis 03/07/2017  . Atherosclerosis of coronary artery bypass graft with unstable angina pectoris ([place]) 04/19/2013  . BACK PAIN, LUMBAR 11/16/2007   Qualifier: Diagnosis of  By: Julien Girt CMA, Leigh    . Benign prostatic hypertrophy   . BENIGN PROSTATIC HYPERTROPHY, HX OF 11/16/2007   Qualifier: Diagnosis of  By: Julien Girt CMA, Leigh    . BRBPR (bright red blood per rectum) 11/01/2016  . CAD (coronary artery disease)    a. s/p CABG in 1979 and 1993;  b. LHC (5/14):  LM, LAD, CFX and RCA occluded; L-LAD ok, dLAD occluded after insertion of LIMA, S-OM occluded, S-PDA/AM 80-90 => PCI with Promus DES; EF 25%  . Cardiomyopathy, ischemic 06/05/2013  . Cerumen impaction    Bilateral  . CERUMEN IMPACTION, BILATERAL 02/03/2010   Qualifier: History of  By: Lenna Gilford MD, Deborra Medina   . Chicken pox as a child  . Chronic systolic CHF (congestive heart failure) ([place])   . COLONIC POLYPS 07/01/2008   Qualifier: Diagnosis of  By: Lenna Gilford MD, Deborra Medina   . Degenerative joint disease   . DEGENERATIVE JOINT DISEASE  11/16/2007   Qualifier: Diagnosis of  By: Julien Girt CMA, Marliss Czar    . Diverticulosis of colon   . DIVERTICULOSIS OF COLON 07/01/2008   Qualifier: Diagnosis of  By: Lenna Gilford MD, Deborra Medina   . Double vision 03/07/2017  . Essential hypertension 11/16/2007   Qualifier: Diagnosis of  By: Julien Girt CMA, Marliss Czar    . Fingernail abnormalities 03/07/2017  . FLANK PAIN, RIGHT 02/03/2010   Qualifier: History of  By: Lenna Gilford MD, Deborra Medina   . GERD 11/16/2007   Qualifier: Diagnosis of  By: Julien Girt CMA, Marliss Czar    . GERD (gastroesophageal reflux disease)   . Gout   . GOUT 11/16/2007   Qualifier: Diagnosis of  By: Julien Girt CMA, Marliss Czar    . Hearing loss 11/24/2014  . Heart murmur   . History of shingles 10/27/2017  . Hypercholesterolemia   . HYPERCHOLESTEROLEMIA 11/16/2007   Qualifier: Diagnosis of  By: Julien Girt CMA, Marliss Czar    . Hypertension   . Ischemic cardiomyopathy    a. echo (09/05/13): EF 35%, diffuse HK worsened distal septal, mid/distal inferior and apical region, grade 1 diastolic dysfunction, mild LAE.    [name] Kidney stone 08/17/2011  . Loss of hearing   . Lumbar back pain   . Measles as a child  .  Medicare annual wellness visit, subsequent 11/24/2014   Sees Dr Delman Cheadle for dermatology Sees Dr Roni Bread of Urology Sees Dr Stanford Breed of cardiology Sees Dr Virginia Rochester of Opthamology No further colonoscopies warranted       . Mumps as a child  . Nephrolithiasis   . Pain in joint, lower leg 07/29/2014  . PERIPHERAL VASCULAR DISEASE 11/16/2007   Qualifier: Diagnosis of  By: Julien Girt CMA, Marliss Czar    . Peripheral vascular disease ([place])   . Rectal bleeding 03/07/2017  . Shingles 07/29/2014  . Sun-damaged skin 05/31/2014   Objective: BP 120/70 (BP Location: Left Arm, Patient Position: Sitting, Cuff Size: Normal)   Pulse (!) 56   Temp 98.1 F (36.7 C) (Oral)   Ht 6' (1.829 m)   Wt 171 lb (77.6 kg)   SpO2 96%   BMI 23.19 kg/m  General: Awake, appears stated age HEENT: MMM, EOMi, PERRLA, no tenderness to light palpation over the globes while eyes  are shut Heart: RRR Lungs: CTAB, no rales, wheezes or rhonchi. No accessory muscle use Neuro: No cerebellar signs, DTRs equal and symmetric bilaterally Psych: Age appropriate judgment and insight, normal affect and mood  Assessment and Plan: Double vision  Since this transient event has not returned, it is less likely to be anything sinister.  We will keep a watchful eye on things if any symptoms return or new symptoms arise, I would like to see him.  We will consider imaging of the brain at that time. Follow-up as needed otherwise. The patient voiced understanding and agreement to the plan.  [place], DO 12/06/17  12:29 PM

## 2017-12-13 ENCOUNTER — Other Ambulatory Visit: Payer: Self-pay | Admitting: Family Medicine

## 2017-12-13 MED ORDER — ALLOPURINOL 300 MG PO TABS: 300.0000 mg | ORAL_TABLET | Freq: Every day | ORAL | 1 refills | Status: DC

## 2017-12-14 ENCOUNTER — Telehealth: Payer: Self-pay | Admitting: Family Medicine

## 2017-12-14 NOTE — Telephone Encounter (Signed)
Copied from [place] [id number]. Topic: Quick Communication - See Telephone Encounter >> Dec 14, 2017  9:34 AM Daniel Reeves wrote: CRM for notification. See Telephone encounter for:   Patient is calling about email he received about shots he needs: Flu (had one 09/04/17) but not sure on Shingles, pneumonia etc.  He would like clarification on what he needs to do and for what.   Please give him a call back.  He inquired about himself and wife Daniel Reeves  12/14/17.

## 2017-12-18 NOTE — Telephone Encounter (Signed)
Called patient left vmail to call the office back

## 2017-12-19 ENCOUNTER — Other Ambulatory Visit: Payer: Self-pay | Admitting: Family Medicine

## 2017-12-19 DIAGNOSIS — I10 Essential (primary) hypertension: Secondary | ICD-10-CM

## 2017-12-19 DIAGNOSIS — E7800 Pure hypercholesterolemia, unspecified: Secondary | ICD-10-CM

## 2017-12-19 DIAGNOSIS — I25700 Atherosclerosis of coronary artery bypass graft(s), unspecified, with unstable angina pectoris: Secondary | ICD-10-CM

## 2018-01-10 ENCOUNTER — Ambulatory Visit (INDEPENDENT_AMBULATORY_CARE_PROVIDER_SITE_OTHER): Payer: Medicare Other | Admitting: Family Medicine

## 2018-01-10 ENCOUNTER — Encounter: Payer: Self-pay | Admitting: Family Medicine

## 2018-01-10 VITALS — BP 128/70 | HR 61 | Temp 97.5°F | Ht 72.0 in | Wt 171.4 lb

## 2018-01-10 DIAGNOSIS — M25511 Pain in right shoulder: Secondary | ICD-10-CM | POA: Diagnosis not present

## 2018-01-10 NOTE — Patient Instructions (Signed)
Ice/cold pack over area for 10-15 min twice daily.  Heat (pad or rice pillow in microwave) over affected area, 10-15 minutes twice daily.   OK to take Tylenol 1000 mg (2 extra strength tabs) or 975 mg (3 regular strength tabs) every 6 hours as needed.  EXERCISES  RANGE OF MOTION (ROM) AND STRETCHING EXERCISES These exercises may help you when beginning to rehabilitate your injury. While completing these exercises, remember:   Restoring tissue flexibility helps normal motion to return to the joints. This allows healthier, less painful movement and activity.  An effective stretch should be held for at least 30 seconds.  A stretch should never be painful. You should only feel a gentle lengthening or release in the stretched tissue.  ROM - Pendulum  Bend at the waist so that your right / left arm falls away from your body. Support yourself with your opposite hand on a solid surface, such as a table or a countertop.  Your right / left arm should be perpendicular to the ground. If it is not perpendicular, you need to lean over farther. Relax the muscles in your right / left arm and shoulder as much as possible.  Gently sway your hips and trunk so they move your right / left arm without any use of your right / left shoulder muscles.  Progress your movements so that your right / left arm moves side to side, then forward and backward, and finally, both clockwise and counterclockwise.  Complete 10-15 repetitions in each direction. Many people use this exercise to relieve discomfort in their shoulder as well as to gain range of motion. Repeat 2 times. Complete this exercise 3 times per week.  STRETCH - Flexion, Standing  Stand with good posture. With an underhand grip on your right / left hand and an overhand grip on the opposite hand, grasp a broomstick or cane so that your hands are a little more than shoulder-width apart.  Keeping your right / left elbow straight and shoulder muscles  relaxed, push the stick with your opposite hand to raise your right / left arm in front of your body and then overhead. Raise your arm until you feel a stretch in your right / left shoulder, but before you have increased shoulder pain.  Try to avoid shrugging your right / left shoulder as your arm rises by keeping your shoulder blade tucked down and toward your mid-back spine. Hold 30 seconds.  Slowly return to the starting position. Repeat 2 times. Complete this exercise 3 times per week.  STRETCH - Internal Rotation  Place your right / left hand behind your back, palm-up.  Throw a towel or belt over your opposite shoulder. Grasp the towel/belt with your right / left hand.  While keeping an upright posture, gently pull up on the towel/belt until you feel a stretch in the front of your right / left shoulder.  Avoid shrugging your right / left shoulder as your arm rises by keeping your shoulder blade tucked down and toward your mid-back spine.  Hold 30. Release the stretch by lowering your opposite hand. Repeat 2 times. Complete this exercise 3 times per week.  STRETCH - External Rotation and Abduction  Stagger your stance through a doorframe. It does not matter which foot is forward.  As instructed by your physician, physical therapist or athletic trainer, place your hands: ? And forearms above your head and on the door frame. ? And forearms at head-height and on the door frame. ? At   elbow-height and on the door frame.  Keeping your head and chest upright and your stomach muscles tight to prevent over-extending your low-back, slowly shift your weight onto your front foot until you feel a stretch across your chest and/or in the front of your shoulders.  Hold 30 seconds. Shift your weight to your back foot to release the stretch. Repeat 2 times. Complete this stretch 3 times per week.   STRENGTHENING EXERCISES  These exercises may help you when beginning to rehabilitate your injury.  They may resolve your symptoms with or without further involvement from your physician, physical therapist or athletic trainer. While completing these exercises, remember:   Muscles can gain both the endurance and the strength needed for everyday activities through controlled exercises.  Complete these exercises as instructed by your physician, physical therapist or athletic trainer. Progress the resistance and repetitions only as guided.  You may experience muscle soreness or fatigue, but the pain or discomfort you are trying to eliminate should never worsen during these exercises. If this pain does worsen, stop and make certain you are following the directions exactly. If the pain is still present after adjustments, discontinue the exercise until you can discuss the trouble with your clinician.  If advised by your physician, during your recovery, avoid activity or exercises which involve actions that place your right / left hand or elbow above your head or behind your back or head. These positions stress the tissues which are trying to heal.  STRENGTH - Scapular Depression and Adduction  With good posture, sit on a firm chair. Supported your arms in front of you with pillows, arm rests or a table top. Have your elbows in line with the sides of your body.  Gently draw your shoulder blades down and toward your mid-back spine. Gradually increase the tension without tensing the muscles along the top of your shoulders and the back of your neck.  Hold for 3 seconds. Slowly release the tension and relax your muscles completely before completing the next repetition.  After you have practiced this exercise, remove the arm support and complete it in standing as well as sitting. Repeat 2 times. Complete this exercise 3 times per week.   STRENGTH - External Rotators  Secure a rubber exercise band/tubing to a fixed object so that it is at the same height as your right / left elbow when you are standing  or sitting on a firm surface.  Stand or sit so that the secured exercise band/tubing is at your side that is not injured.  Bend your elbow 90 degrees. Place a folded towel or small pillow under your right / left arm so that your elbow is a few inches away from your side.  Keeping the tension on the exercise band/tubing, pull it away from your body, as if pivoting on your elbow. Be sure to keep your body steady so that the movement is only coming from your shoulder rotating.  Hold 3 seconds. Release the tension in a controlled manner as you return to the starting position. Repeat 2 times. Complete this exercise 3 times per week.   STRENGTH - Supraspinatus  Stand or sit with good posture. Grasp a 2-3 lb weight or an exercise band/tubing so that your hand is "thumbs-up," like when you shake hands.  Slowly lift your right / left hand from your thigh into the air, traveling about 30 degrees from straight out at your side. Lift your hand to shoulder height or as far as you   can without increasing any shoulder pain. Initially, many people do not lift their hands above shoulder height.  Avoid shrugging your right / left shoulder as your arm rises by keeping your shoulder blade tucked down and toward your mid-back spine.  Hold for 3 seconds. Control the descent of your hand as you slowly return to your starting position. Repeat 2 times. Complete this exercise 3 times per week.   STRENGTH - Shoulder Extensors  Secure a rubber exercise band/tubing so that it is at the height of your shoulders when you are either standing or sitting on a firm arm-less chair.  With a thumbs-up grip, grasp an end of the band/tubing in each hand. Straighten your elbows and lift your hands straight in front of you at shoulder height. Step back away from the secured end of band/tubing until it becomes tense.  Squeezing your shoulder blades together, pull your hands down to the sides of your thighs. Do not allow your hands  to go behind you.  Hold for 3 seconds. Slowly ease the tension on the band/tubing as you reverse the directions and return to the starting position. Repeat 2 times. Complete this exercise 3 times per week.   STRENGTH - Scapular Retractors  Secure a rubber exercise band/tubing so that it is at the height of your shoulders when you are either standing or sitting on a firm arm-less chair.  With a palm-down grip, grasp an end of the band/tubing in each hand. Straighten your elbows and lift your hands straight in front of you at shoulder height. Step back away from the secured end of band/tubing until it becomes tense.  Squeezing your shoulder blades together, draw your elbows back as you bend them. Keep your upper arm lifted away from your body throughout the exercise.  Hold 3 seconds. Slowly ease the tension on the band/tubing as you reverse the directions and return to the starting position. Repeat 2 times. Complete this exercise 3 times per week.  STRENGTH - Scapular Depressors  Find a sturdy chair without wheels, such as a from a dining room table.  Keeping your feet on the floor, lift your bottom from the seat and lock your elbows.  Keeping your elbows straight, allow gravity to pull your body weight down. Your shoulders will rise toward your ears.  Raise your body against gravity by drawing your shoulder blades down your back, shortening the distance between your shoulders and ears. Although your feet should always maintain contact with the floor, your feet should progressively support less body weight as you get stronger.  Hold 3 seconds. In a controlled and slow manner, lower your body weight to begin the next repetition. Repeat 2 times. Complete this exercise 3 times per week.   This information is not intended to replace advice given to you by your health care provider. Make sure you discuss any questions you have with your health care provider.  Document Released: 09/28/2005  Document Revised: 12/05/2014 Document Reviewed: 02/26/2009 Elsevier Interactive Patient Education 2016 [employer]  

## 2018-01-10 NOTE — Progress Notes (Signed)
Pre visit review using our clinic review tool, if applicable. No additional management support is needed unless otherwise documented below in the visit note. 

## 2018-01-10 NOTE — Progress Notes (Signed)
Musculoskeletal Exam  Patient: Daniel Reeves DOB: [birth year]  DOS: 01/10/2018  SUBJECTIVE:  Chief Complaint:   Chief Complaint  Patient presents with  . Shoulder Pain    Daniel Reeves is a 84 y.o.  male for evaluation and treatment of R shoulder pain.   Onset:  2 days ago. Started hurting after he threw a stick in the yard.  Location: R outer shoulder Character:  aching and sharp  Progression of issue:  is unchanged Associated symptoms: none; denies catching/locking, weakness Treatment: to date has been activity modification.   Neurovascular symptoms: no  ROS: Musculoskeletal/Extremities: +R shoulder pain  Past Medical History:  Diagnosis Date  . Abdominal aortic aneurysm ([place])    a. [place] (1/14):  3.3 x 3.4 cm => f/u 11/2013  . Amebic dysentery   . AMEBIC DYSENTERY 11/19/2007   Qualifier: History of  By: Lenna Gilford MD, Deborra Medina   . Anemia 03/07/2017  . Anxiety   . ANXIETY 11/19/2007   Qualifier: Diagnosis of  By: Lenna Gilford MD, Deborra Medina   . Arthritis 03/07/2017  . 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 BACK PAIN, LUMBAR 11/16/2007   Qualifier: Diagnosis of  By: Julien Girt CMA, Leigh    . Benign prostatic hypertrophy   . BENIGN PROSTATIC HYPERTROPHY, HX OF 11/16/2007   Qualifier: Diagnosis of  By: Julien Girt CMA, Leigh    . BRBPR (bright red blood per rectum) 11/01/2016  . CAD (coronary artery disease)    a. s/p CABG in 1979 and 1993;  b. LHC (5/14):  LM, LAD, CFX and RCA occluded; L-LAD ok, dLAD occluded after insertion of LIMA, S-OM occluded, S-PDA/AM 80-90 => PCI with Promus DES; EF 25%  . Cardiomyopathy, ischemic 06/05/2013  . Cerumen impaction    Bilateral  . CERUMEN IMPACTION, BILATERAL 02/03/2010   Qualifier: History of  By: Lenna Gilford MD, Deborra Medina   . Chicken pox as a child  . Chronic systolic CHF (congestive heart failure) ([place])   . COLONIC POLYPS 07/01/2008   Qualifier: Diagnosis of  By: Lenna Gilford MD, Deborra Medina   . Degenerative joint  disease   . DEGENERATIVE JOINT DISEASE 11/16/2007   Qualifier: Diagnosis of  By: Julien Girt CMA, Marliss Czar    . Diverticulosis of colon   . DIVERTICULOSIS OF COLON 07/01/2008   Qualifier: Diagnosis of  By: Lenna Gilford MD, Deborra Medina   . Double vision 03/07/2017  . Essential hypertension 11/16/2007   Qualifier: Diagnosis of  By: Julien Girt CMA, Marliss Czar    . Fingernail abnormalities 03/07/2017  . FLANK PAIN, RIGHT 02/03/2010   Qualifier: History of  By: Lenna Gilford MD, Deborra Medina   . GERD 11/16/2007   Qualifier: Diagnosis of  By: Julien Girt CMA, Marliss Czar    . GERD (gastroesophageal reflux disease)   . Gout   . GOUT 11/16/2007   Qualifier: Diagnosis of  By: Julien Girt CMA, Marliss Czar    . Hearing loss 11/24/2014  . Heart murmur   . History of shingles 10/27/2017  . Hypercholesterolemia   . HYPERCHOLESTEROLEMIA 11/16/2007   Qualifier: Diagnosis of  By: Julien Girt CMA, Marliss Czar    . Hypertension   . Ischemic cardiomyopathy    a. echo (09/05/13): EF 35%, diffuse HK worsened distal septal, mid/distal inferior and apical region, grade 1 diastolic dysfunction, mild LAE.    [name] Kidney stone 08/17/2011  . Loss of hearing   . Lumbar back pain   . Measles as a child  . Medicare annual wellness visit, subsequent 11/24/2014  Sees Dr Delman Cheadle for dermatology Sees Dr Roni Bread of Urology Sees Dr Stanford Breed of cardiology Sees Dr Virginia Rochester of Opthamology No further colonoscopies warranted       . Mumps as a child  . Nephrolithiasis   . Pain in joint, lower leg 07/29/2014  . PERIPHERAL VASCULAR DISEASE 11/16/2007   Qualifier: Diagnosis of  By: Julien Girt CMA, Marliss Czar    . Peripheral vascular disease ([place])   . Rectal bleeding 03/07/2017  . Shingles 07/29/2014  . Sun-damaged skin 05/31/2014    Objective: VITAL SIGNS: BP 128/70 (BP Location: Left Arm, Patient Position: Sitting, Cuff Size: Normal)   Pulse 61   Temp (!) 97.5 F (36.4 C) (Oral)   Ht 6' (1.829 m)   Wt 171 lb 6 oz (77.7 kg)   SpO2 97%   BMI 23.24 kg/m  Constitutional: Well formed, well developed. No acute  distress. Cardiovascular: Brisk cap refill Thorax & Lungs: No accessory muscle use Musculoskeletal: R shoulder   Normal active range of motion: yes.   Normal passive range of motion: yes Tenderness to palpation: no Deformity: no Ecchymosis: no Tests positive: Empty can on R Tests negative: Neer's, Hawkins, Speed's, Cross over, Obrien's, Lift off Neurologic: Normal sensory function. No focal deficits noted.  Psychiatric: Normal mood. Age appropriate judgment and insight. Alert & oriented x 3.    Assessment:  Acute pain of right shoulder  Plan: Tylenol, heat, ice, stretches/exercises. Appears to be improving.  F/u prn. The patient voiced understanding and agreement to the plan.   [place], DO 01/10/18  12:29 PM

## 2018-02-04 ENCOUNTER — Encounter (HOSPITAL_BASED_OUTPATIENT_CLINIC_OR_DEPARTMENT_OTHER): Payer: Self-pay | Admitting: Emergency Medicine

## 2018-02-04 ENCOUNTER — Emergency Department (HOSPITAL_BASED_OUTPATIENT_CLINIC_OR_DEPARTMENT_OTHER): Payer: Medicare Other

## 2018-02-04 ENCOUNTER — Emergency Department (HOSPITAL_BASED_OUTPATIENT_CLINIC_OR_DEPARTMENT_OTHER)
Admission: EM | Admit: 2018-02-04 | Discharge: 2018-02-04 | Disposition: A | Discharge status: Home / Self Care | Payer: Medicare Other | Attending: Emergency Medicine | Admitting: Emergency Medicine

## 2018-02-04 ENCOUNTER — Other Ambulatory Visit: Payer: Self-pay

## 2018-02-04 DIAGNOSIS — Z7982 Long term (current) use of aspirin: Secondary | ICD-10-CM | POA: Diagnosis not present

## 2018-02-04 DIAGNOSIS — I2510 Atherosclerotic heart disease of native coronary artery without angina pectoris: Secondary | ICD-10-CM | POA: Diagnosis not present

## 2018-02-04 DIAGNOSIS — I110 Hypertensive heart disease with heart failure: Secondary | ICD-10-CM | POA: Diagnosis not present

## 2018-02-04 DIAGNOSIS — G459 Transient cerebral ischemic attack, unspecified: Secondary | ICD-10-CM

## 2018-02-04 DIAGNOSIS — R42 Dizziness and giddiness: Secondary | ICD-10-CM | POA: Diagnosis not present

## 2018-02-04 DIAGNOSIS — Z87891 Personal history of nicotine dependence: Secondary | ICD-10-CM | POA: Diagnosis not present

## 2018-02-04 DIAGNOSIS — I5022 Chronic systolic (congestive) heart failure: Secondary | ICD-10-CM | POA: Insufficient documentation

## 2018-02-04 DIAGNOSIS — Z79899 Other long term (current) drug therapy: Secondary | ICD-10-CM | POA: Insufficient documentation

## 2018-02-04 DIAGNOSIS — R197 Diarrhea, unspecified: Secondary | ICD-10-CM | POA: Diagnosis not present

## 2018-02-04 LAB — RAPID URINE DRUG SCREEN, HOSP PERFORMED
Amphetamines: NOT DETECTED
BARBITURATES: NOT DETECTED
BENZODIAZEPINES: NOT DETECTED
Cocaine: NOT DETECTED
Opiates: NOT DETECTED
Tetrahydrocannabinol: NOT DETECTED

## 2018-02-04 LAB — COMPREHENSIVE METABOLIC PANEL
ALBUMIN: 3.7 g/dL (ref 3.5–5.0)
ALK PHOS: 66 U/L (ref 38–126)
ALT: 19 U/L (ref 17–63)
AST: 27 U/L (ref 15–41)
Anion gap: 7 (ref 5–15)
BILIRUBIN TOTAL: 1.1 mg/dL (ref 0.3–1.2)
BUN: 15 mg/dL (ref 6–20)
CALCIUM: 8.9 mg/dL (ref 8.9–10.3)
CO2: 25 mmol/L (ref 22–32)
Chloride: 107 mmol/L (ref 101–111)
Creatinine, Ser: 0.99 mg/dL (ref 0.61–1.24)
GFR calc Af Amer: >60 mL/min (ref >60)
GFR calc non Af Amer: >60 mL/min (ref >60)
GLUCOSE: 116 mg/dL — AB (ref 65–99)
POTASSIUM: 3.8 mmol/L (ref 3.5–5.1)
Sodium: 139 mmol/L (ref 135–145)
TOTAL PROTEIN: 6.5 g/dL (ref 6.5–8.1)

## 2018-02-04 LAB — URINALYSIS, ROUTINE W REFLEX MICROSCOPIC
Bilirubin Urine: NEGATIVE
Glucose, UA: NEGATIVE mg/dL
HGB URINE DIPSTICK: NEGATIVE
Ketones, ur: NEGATIVE mg/dL
Leukocytes, UA: NEGATIVE
NITRITE: NEGATIVE
PROTEIN: NEGATIVE mg/dL
Specific Gravity, Urine: 1.01 (ref 1.005–1.030)
pH: 5.5 (ref 5.0–8.0)

## 2018-02-04 LAB — DIFFERENTIAL
BASOS ABS: 0 10*3/uL (ref 0.0–0.1)
Basophils Relative: 0 %
Eosinophils Absolute: 0.2 10*3/uL (ref 0.0–0.7)
Eosinophils Relative: 2 %
LYMPHS ABS: 1.4 10*3/uL (ref 0.7–4.0)
Lymphocytes Relative: 19 %
MONOS PCT: 11 %
Monocytes Absolute: 0.8 10*3/uL (ref 0.1–1.0)
NEUTROS PCT: 68 %
Neutro Abs: 4.8 10*3/uL (ref 1.7–7.7)

## 2018-02-04 LAB — APTT: APTT: 30 s (ref 24–36)

## 2018-02-04 LAB — CBC
HEMATOCRIT: 36.8 % — AB (ref 39.0–52.0)
HEMOGLOBIN: 12.6 g/dL — AB (ref 13.0–17.0)
MCH: 32.6 pg (ref 26.0–34.0)
MCHC: 34.2 g/dL (ref 30.0–36.0)
MCV: 95.1 fL (ref 78.0–100.0)
Platelets: 175 10*3/uL (ref 150–400)
RBC: 3.87 MIL/uL — AB (ref 4.22–5.81)
RDW: 13.8 % (ref 11.5–15.5)
WBC: 7.1 10*3/uL (ref 4.0–10.5)

## 2018-02-04 LAB — ETHANOL: Alcohol, Ethyl (B): <10 mg/dL (ref <10)

## 2018-02-04 LAB — PROTIME-INR
INR: 1.09
Prothrombin Time: 14 seconds (ref 11.4–15.2)

## 2018-02-04 LAB — TROPONIN I: Troponin I: <0.03 ng/mL (ref <0.03)

## 2018-02-04 NOTE — ED Notes (Signed)
ED Provider at bedside. 

## 2018-02-04 NOTE — ED Notes (Signed)
Patient transported to CT 

## 2018-02-04 NOTE — ED Triage Notes (Signed)
States woke up around 0500 and was unable to read the numbers on his digital clock . Got up to make coffee and the room was spinning. Dizziness and blurred vision resolved after 30 minutes . No neuro deficit noted at present.

## 2018-02-04 NOTE — ED Provider Notes (Signed)
[facility] EMERGENCY DEPARTMENT Provider Note   CSN: [id number] Arrival date & time: 02/04/18  0736     History   Chief Complaint Chief Complaint  Patient presents with  . Dizziness    HPI Daniel Reeves is a 84 y.o. male.  Patient is a 84 year old male with a history of hypertension, extensive coronary artery disease status post bypass x2 and stenting, GERD and peripheral vascular disease presenting today with 15-20-minute episode at 5 AM this morning of vision changes and dizziness.  Patient states yesterday was a normal day he had no complaints and went to bed feeling normal.  At 5 AM patient woke up and noticed his vision was not normal.  He states when he looked at his clock he can only see half of the 5 and the other numbers were scrunched together.  He also at that time was feeling dizzy which he described as the room spinning.  He stayed in bed for 15-20 minutes and symptoms seem to go away.  He did walk to the bathroom and noticed the dizziness was they are just slightly but did not have any difficulty walking.  He denies any nausea or vomiting.  He went to his kitchen to make coffee and in 15 or 20 minutes had the urge to have a bowel movement.  He states he had an explosive bowel movement that he did not make it to the bathroom for but he denies any dizziness at that time.  He has had no abdominal pain.  He currently has no symptoms and feels normal.  He has not had no trauma to his head and he currently only takes an aspirin daily as he was taken off Plavix about 1 year ago.  He denies any speech difficulty, unilateral numbness or weakness during this episode.  Patient's wife is present and did not notice any speech changes or facial droops.  The patient has had no recent medication changes.  And he denies any chest pain, shortness of breath or palpitations during these episodes or since.   The history is provided by the patient.  Dizziness  Quality:  Room  spinning Severity:  Mild Onset quality:  Sudden Duration: 15-21min. Timing:  Constant Progression:  Resolved Chronicity:  New Context comment:  Started when he sat up in bed and also having vision changes.  missing half of the clock and numbers were bunched together Relieved by:  Nothing Worsened by:  Nothing Ineffective treatments:  None tried Associated symptoms: diarrhea and vision changes   Associated symptoms: no chest pain, no headaches, no nausea, no palpitations, no shortness of breath, no syncope, no tinnitus, no vomiting and no weakness   Associated symptoms comment:  Did have 1 large explosive BM this morning but denies any further and states no abd pain, nausea or vomiting. Risk factors: heart disease   Risk factors: no hx of vertigo and no new medications     Past Medical History:  Diagnosis Date  . Abdominal aortic aneurysm ([place])    a. [place] (1/14):  3.3 x 3.4 cm => f/u 11/2013  . Amebic dysentery   . AMEBIC DYSENTERY 11/19/2007   Qualifier: History of  By: Lenna Gilford MD, Deborra Medina   . Anemia 03/07/2017  . Anxiety   . ANXIETY 11/19/2007   Qualifier: Diagnosis of  By: Lenna Gilford MD, Deborra Medina   . Arthritis 03/07/2017  . Atherosclerosis of coronary artery bypass graft with unstable angina pectoris ([place]) 04/19/2013  . BACK PAIN, LUMBAR  11/16/2007   Qualifier: Diagnosis of  By: Julien Girt CMA, Marliss Czar    . Benign prostatic hypertrophy   . BENIGN PROSTATIC HYPERTROPHY, HX OF 11/16/2007   Qualifier: Diagnosis of  By: Julien Girt CMA, Leigh    . BRBPR (bright red blood per rectum) 11/01/2016  . CAD (coronary artery disease)    a. s/p CABG in 1979 and 1993;  b. LHC (5/14):  LM, LAD, CFX and RCA occluded; L-LAD ok, dLAD occluded after insertion of LIMA, S-OM occluded, S-PDA/AM 80-90 => PCI with Promus DES; EF 25%  . Cardiomyopathy, ischemic 06/05/2013  . Cerumen impaction    Bilateral  . CERUMEN IMPACTION, BILATERAL 02/03/2010   Qualifier: History of  By: Lenna Gilford MD, Deborra Medina   . Chicken pox as a child  .  Chronic systolic CHF (congestive heart failure) ([place])   . COLONIC POLYPS 07/01/2008   Qualifier: Diagnosis of  By: Lenna Gilford MD, Deborra Medina   . Degenerative joint disease   . DEGENERATIVE JOINT DISEASE 11/16/2007   Qualifier: Diagnosis of  By: Julien Girt CMA, Marliss Czar    . Diverticulosis of colon   . DIVERTICULOSIS OF COLON 07/01/2008   Qualifier: Diagnosis of  By: Lenna Gilford MD, Deborra Medina   . Double vision 03/07/2017  . Essential hypertension 11/16/2007   Qualifier: Diagnosis of  By: Julien Girt CMA, Marliss Czar    . Fingernail abnormalities 03/07/2017  . FLANK PAIN, RIGHT 02/03/2010   Qualifier: History of  By: Lenna Gilford MD, Deborra Medina   . GERD 11/16/2007   Qualifier: Diagnosis of  By: Julien Girt CMA, Marliss Czar    . GERD (gastroesophageal reflux disease)   . Gout   . GOUT 11/16/2007   Qualifier: Diagnosis of  By: Julien Girt CMA, Marliss Czar    . Hearing loss 11/24/2014  . Heart murmur   . History of shingles 10/27/2017  . Hypercholesterolemia   . HYPERCHOLESTEROLEMIA 11/16/2007   Qualifier: Diagnosis of  By: Julien Girt CMA, Marliss Czar    . Hypertension   . Ischemic cardiomyopathy    a. echo (09/05/13): EF 35%, diffuse HK worsened distal septal, mid/distal inferior and apical region, grade 1 diastolic dysfunction, mild LAE.    [name] Kidney stone 08/17/2011  . Loss of hearing   . Lumbar back pain   . Measles as a child  . Medicare annual wellness visit, subsequent 11/24/2014   Sees Dr Delman Cheadle for dermatology Sees Dr Roni Bread of Urology Sees Dr Stanford Breed of cardiology Sees Dr Virginia Rochester of Opthamology No further colonoscopies warranted       . Mumps as a child  . Nephrolithiasis   . Pain in joint, lower leg 07/29/2014  . PERIPHERAL VASCULAR DISEASE 11/16/2007   Qualifier: Diagnosis of  By: Julien Girt CMA, Marliss Czar    . Peripheral vascular disease ([place])   . Rectal bleeding 03/07/2017  . Shingles 07/29/2014  . Sun-damaged skin 05/31/2014    Patient Active Problem List   Diagnosis Date Noted  . Hemorrhoid 10/29/2017  . History of shingles 10/27/2017  . Anemia 03/07/2017  .  Rectal bleeding 03/07/2017  . Double vision 03/07/2017  . Fingernail abnormalities 03/07/2017  . Arthritis 03/07/2017  . Skin lesion of right ear 11/01/2016  . BRBPR (bright red blood per rectum) 11/01/2016  . Medicare annual wellness visit, subsequent 11/24/2014  . Hearing loss 11/24/2014  . Shingles 07/29/2014  . Pain in joint, lower leg 07/29/2014  . Sun-damaged skin 05/31/2014  . Chicken pox   . Measles   . Mumps   . Cardiomyopathy, ischemic 06/05/2013  . Atherosclerosis of  coronary artery bypass graft with unstable angina pectoris ([place]) 04/19/2013  . Kidney stone 08/17/2011  . CAD (coronary artery disease)   . CERUMEN IMPACTION, BILATERAL 02/03/2010  . FLANK PAIN, RIGHT 02/03/2010  . Abdominal aortic aneurysm ([place]) 11/04/2009  . COLONIC POLYPS 07/01/2008  . ANXIETY 11/19/2007  . HYPERCHOLESTEROLEMIA 11/16/2007  . Gout 11/16/2007  . Essential hypertension 11/16/2007  . PERIPHERAL VASCULAR DISEASE 11/16/2007  . GERD 11/16/2007  . Osteoarthritis 11/16/2007  . BACK PAIN, LUMBAR 11/16/2007  . BENIGN PROSTATIC HYPERTROPHY, HX OF 11/16/2007    Past Surgical History:  Procedure Laterality Date  . CORONARY ANGIOPLASTY WITH STENT PLACEMENT  04/18/2013   RCA       . CORONARY ARTERY BYPASS GRAFT  1979   x4 SVG-DIAG-LAD, SVG-OM-PDA  . CORONARY ARTERY BYPASS GRAFT  1993   Redo x5 by Dr Harlow Asa; [facility], SVG-OM, SVG-AM-PL  . Decompressive laminectomy  01/2006   L2 - scarum by Dr. Shellia Carwin  . HEMORRHOID SURGERY     fissure with hemorrhoid corrected at age 52  . INGUINAL HERNIA REPAIR  1994   Right by Dr Harlow Asa  . INGUINAL HERNIA REPAIR  1996   Left by Dr. Harlow Asa  . LEFT HEART CATHETERIZATION WITH CORONARY ANGIOGRAM N/A 09/23/2013   Procedure: LEFT HEART CATHETERIZATION WITH CORONARY ANGIOGRAM;  Surgeon: Blane Ohara, MD;  Location: [facility] CATH LAB;  Service: Cardiovascular;  Laterality: N/A;  . PERCUTANEOUS CORONARY STENT INTERVENTION (PCI-S) N/A 04/18/2013   Procedure:  PERCUTANEOUS CORONARY STENT INTERVENTION (PCI-S);  Surgeon: Sherren Mocha, MD;  Location: [facility] CATH LAB;  Service: Cardiovascular;  Laterality: N/A;  . TONSILLECTOMY         Home Medications    Prior to Admission medications   Medication Sig Start Date End Date Taking? Authorizing Provider  allopurinol (ZYLOPRIM) 300 MG tablet Take 1 tablet (300 mg total) by mouth daily. 12/13/17   [name], MD  aspirin EC 81 MG tablet Take 81 mg by mouth every morning.     [provider]  B Complex-C (B-COMPLEX WITH VITAMIN C) tablet Take 1 tablet by mouth daily.    [provider]  Cholecalciferol (VITAMIN D-3 PO) Take 5,000 Units by mouth daily with breakfast.     [provider]  finasteride (PROSCAR) 5 MG tablet Takes every third day    [provider]  folic acid (FOLVITE) 578 MCG tablet Take 400 mcg by mouth 2 (two) times daily.     [provider]  isosorbide mononitrate (IMDUR) 30 MG 24 hr tablet Take 3 tablets (90 mg total) by mouth daily. 03/22/17   Lelon Perla, MD  losartan (COZAAR) 50 MG tablet Take 1 tablet (50 mg total) by mouth daily. 03/22/17   Lelon Perla, MD  metoprolol succinate (TOPROL-XL) 25 MG 24 hr tablet Take 0.5 tablets (12.5 mg total) by mouth daily. 01/11/17   Lelon Perla, MD  Misc Natural Products (OSTEO BI-FLEX ADV JOINT SHIELD) TABS Take 1 tablet by mouth 2 (two) times daily.     [provider]  nitroGLYCERIN (NITROSTAT) 0.4 MG SL tablet Place 1 tablet (0.4 mg total) under the tongue every 5 (five) minutes as needed. For chest pain. 12/01/15   Lelon Perla, MD  simvastatin (ZOCOR) 40 MG tablet TAKE 1 BY MOUTH EVERY EVENING 01/11/17   Lelon Perla, MD  simvastatin (ZOCOR) 40 MG tablet TAKE 1 TABLET BY MOUTH EVERY EVENING 12/19/17   [name], MD    Family History  Family History  Problem Relation Age of Onset  . Parkinsonism Brother   . Diabetes Maternal Grandmother   . Depression  Daughter   . Other Son        4 stents  . Heart disease Son   . Diabetes Son        type 2    Social History Social History   Tobacco Use  . Smoking status: Former Smoker    Last attempt to quit: 11/28/1944    Years since quitting: 73.2  . Smokeless tobacco: Never Used  Substance Use Topics  . Alcohol use: Yes    Alcohol/week: 1.2 oz    Types: 2 Standard drinks or equivalent per week    Comment: daily rum  or wine  . Drug use: No     Allergies   Lisinopril; Methocarbamol; Pregabalin; and Ramipril   Review of Systems Review of Systems  HENT: Negative for tinnitus.   Respiratory: Negative for shortness of breath.   Cardiovascular: Negative for chest pain, palpitations and syncope.  Gastrointestinal: Positive for diarrhea. Negative for nausea and vomiting.  Neurological: Positive for dizziness. Negative for weakness and headaches.  All other systems reviewed and are negative.    Physical Exam Updated Vital Signs BP (!) 165/71 (BP Location: Right Arm)   Pulse (!) 58   Temp 98.9 F (37.2 C) (Oral)   Resp 16   Ht 6' (1.829 m)   Wt 77.1 kg (170 lb)   SpO2 100%   BMI 23.06 kg/m   Physical Exam  Constitutional: He is oriented to person, place, and time. He appears well-developed and well-nourished. No distress.  HENT:  Head: Normocephalic and atraumatic.  Mouth/Throat: Oropharynx is clear and moist.  Eyes: Conjunctivae and EOM are normal. Pupils are equal, round, and reactive to light.  No nystagmus  Neck: Normal range of motion. Neck supple.  Cardiovascular: Normal rate, regular rhythm and intact distal pulses.  Murmur heard. Pulmonary/Chest: Effort normal and breath sounds normal. No respiratory distress. He has no wheezes. He has no rales.  Abdominal: Soft. He exhibits no distension. There is no tenderness. There is no rebound and no guarding.  Musculoskeletal: Normal range of motion. He exhibits no edema or tenderness.  Neurological: He is alert and oriented  to person, place, and time. He has normal strength. No cranial nerve deficit or sensory deficit. Coordination and gait normal.  5 out of 5 strength in all 4 extremities.  No pronator drift noted in any extremity.  Normal finger to nose testing.  Normal heel to shin bilaterally.  No visual field cuts.  No aphasia.  Skin: Skin is warm and dry. No rash noted. No erythema.  Psychiatric: He has a normal mood and affect. His behavior is normal.  Nursing note and vitals reviewed.    ED Treatments / Results  Labs (all labs ordered are listed, but only abnormal results are displayed) Labs Reviewed  CBC - Abnormal; Notable for the following components:      Result Value   RBC 3.87 (*)    Hemoglobin 12.6 (*)    HCT 36.8 (*)    All other components within normal limits  COMPREHENSIVE METABOLIC PANEL - Abnormal; Notable for the following components:   Glucose, Bld 116 (*)    All other components within normal limits  ETHANOL  PROTIME-INR  APTT  DIFFERENTIAL  TROPONIN I  RAPID URINE DRUG SCREEN, HOSP PERFORMED  URINALYSIS, ROUTINE W REFLEX MICROSCOPIC    EKG ED ECG REPORT  Date: 02/04/2018  Rate: 54  Rhythm: normal sinus rhythm  QRS Axis: normal  Intervals: normal  ST/T Wave abnormalities: nonspecific ST/T changes  Conduction Disutrbances:left bundle branch block  Narrative Interpretation:   Old EKG Reviewed: unchanged  I have personally reviewed the EKG tracing and agree with the computerized printout as noted.  Radiology Ct Head Wo Contrast  Result Date: 02/04/2018 CLINICAL DATA:  Blurred vision and dizziness. EXAM: CT HEAD WITHOUT CONTRAST TECHNIQUE: Contiguous axial images were obtained from the base of the skull through the vertex without intravenous contrast. COMPARISON:  06/16/2015 FINDINGS: Brain: Expected cerebral and cerebellar atrophy for age. Mild for age low density in the periventricular white matter likely related to small vessel disease. No mass lesion, hemorrhage,  hydrocephalus, acute infarct, intra-axial, or extra-axial fluid collection. Vascular: Intracranial atherosclerosis. Skull: Normal Sinuses/Orbits: Normal imaged portions of the orbits and globes. Clear paranasal sinuses and mastoid air cells. Other: None. IMPRESSION: 1.  No acute intracranial abnormality. 2.  Cerebral atrophy and small vessel ischemic change. Electronically Signed   By: Abigail Miyamoto M.D.   On: 02/04/2018 08:35    Procedures Procedures (including critical care time)  Medications Ordered in ED Medications - No data to display   Initial Impression / Assessment and Plan / ED Course  I have reviewed the triage vital signs and the nursing notes.  Pertinent labs & imaging results that were available during my care of the patient were reviewed by me and considered in my medical decision making (see chart for details).   Elderly patient with extensive coronary disease as well as other stroke risk factors presenting today with symptoms concerning for TIA with vision changes and dizziness.  Patient symptoms have completely resolved.  He is well-appearing with a normal neuro exam.  Patient did have one large explosive bowel movement this morning however he does not appear systemically ill he does not have abdominal pain and he is not had nausea or vomiting. Stroke workup initiated.  Current NIH of 0  9:47 AM Patient remains symptom-free.  Labs without acute findings.  CT without acute findings.  Discussed with patient recommendation for admission for TIA workup.  However patient would like to go home and refers to follow-up as an outpatient.  Discussed with the patient that his symptoms could return and they may not go away this next time.  Also ensure that the patient understood if his symptoms return he needed to seek care immediately.  The patient and his wife understand all of these things.  Recommended he call his doctor in the morning for ongoing workup.  Patient does take a baby aspirin  daily and recommended he continue that at this time   Final Clinical Impressions(s) / ED Diagnoses   Final diagnoses:  TIA (transient ischemic attack)    ED Discharge Orders    None       Blanchie Dessert, MD 02/04/18 [phone]

## 2018-02-04 NOTE — Discharge Instructions (Signed)
If your symptoms return seek care immediately

## 2018-02-05 ENCOUNTER — Telehealth: Payer: Self-pay | Admitting: *Deleted

## 2018-02-05 NOTE — Telephone Encounter (Signed)
Call patient to advise him of follow up for Dr. Charlett Blake.   Per Dr. Charlett Blake he is ok to see another provider in the office due to her schedule being full/   Patient agreed to see Dr. Nani Ravens and states he needs lab work done form his ER visit from Sunday.   I advised him that any lab work will be discussed with Dr. Nani Ravens on Wednesday. He was ok with that.

## 2018-02-05 NOTE — Telephone Encounter (Signed)
Copied from [place]. Topic: Appointment Scheduling - Scheduling Inquiry for Clinic >> Feb 05, 2018  7:49 AM Daniel Reeves wrote: Reason for CRM: Patient calling and stating that he was seen in ER yesterday, states he is to follow up today for additional testing(mini stroke), no other appts available today. Ok to work in or place with another provider?

## 2018-02-05 NOTE — Telephone Encounter (Signed)
Thanks

## 2018-02-07 ENCOUNTER — Ambulatory Visit (INDEPENDENT_AMBULATORY_CARE_PROVIDER_SITE_OTHER): Payer: Medicare Other | Admitting: Family Medicine

## 2018-02-07 ENCOUNTER — Encounter: Payer: Self-pay | Admitting: Family Medicine

## 2018-02-07 VITALS — BP 130/72 | HR 51 | Temp 98.1°F | Ht 72.0 in | Wt 172.1 lb

## 2018-02-07 DIAGNOSIS — K644 Residual hemorrhoidal skin tags: Secondary | ICD-10-CM | POA: Diagnosis not present

## 2018-02-07 DIAGNOSIS — G459 Transient cerebral ischemic attack, unspecified: Secondary | ICD-10-CM | POA: Diagnosis not present

## 2018-02-07 LAB — CBC
HCT: 40.2 % (ref 39.0–52.0)
Hemoglobin: 13.8 g/dL (ref 13.0–17.0)
MCHC: 34.3 g/dL (ref 30.0–36.0)
MCV: 96.2 fl (ref 78.0–100.0)
Platelets: 185 10*3/uL (ref 150.0–400.0)
RBC: 4.18 Mil/uL — AB (ref 4.22–5.81)
RDW: 14.1 % (ref 11.5–15.5)
WBC: 6.5 10*3/uL (ref 4.0–10.5)

## 2018-02-07 NOTE — Progress Notes (Signed)
Chief Complaint  Patient presents with  . Follow-up    stroke    Subjective: Patient is a 84 y.o. male here for ER f/u.  He went to ED on 3/10 for dizziness and vision changes (around 20 min). He woke up like this. CT head neg, labs largely neg. He has been asymptomatic since then. He follows with the cardiology team and had a negative echo and carotid ultrasound in the middle of February last year.  He does not notice any added stress in his life recently.  Past 6 months, he is noticed blood on the toilet tissue every time he has a bowel movement.  He does have a history of anal fissures and hemorrhoids.  He denies any itching or pain.  He does not feel that he is having any straining. +episodes of incontinence. He does not wish to see or follow with a GI doc.    ROS: GI: As noted in HPI Neuro: As noted in HPI  Family History  Problem Relation Age of Onset  . Parkinsonism Brother   . Diabetes Maternal Grandmother   . Depression Daughter   . Other Son        4 stents  . Heart disease Son   . Diabetes Son        type 2   Past Medical History:  Diagnosis Date  . Abdominal aortic aneurysm ([place])    a. [place] (1/14):  3.3 x 3.4 cm => f/u 11/2013  . Amebic dysentery   . AMEBIC DYSENTERY 11/19/2007   Qualifier: History of  By: Lenna Gilford MD, Deborra Medina   . Anemia 03/07/2017  . Anxiety   . ANXIETY 11/19/2007   Qualifier: Diagnosis of  By: Lenna Gilford MD, Deborra Medina   . Arthritis 03/07/2017  . Atherosclerosis of coronary artery bypass graft with unstable angina pectoris ([place]) 04/19/2013  . BACK PAIN, LUMBAR 11/16/2007   Qualifier: Diagnosis of  By: Julien Girt CMA, Leigh    . Benign prostatic hypertrophy   . BENIGN PROSTATIC HYPERTROPHY, HX OF 11/16/2007   Qualifier: Diagnosis of  By: Julien Girt CMA, Leigh    . BRBPR (bright red blood per rectum) 11/01/2016  . CAD (coronary artery disease)    a. s/p CABG in 1979 and 1993;  b. LHC (5/14):  LM, LAD, CFX and RCA occluded; L-LAD ok, dLAD occluded after insertion of  LIMA, S-OM occluded, S-PDA/AM 80-90 => PCI with Promus DES; EF 25%  . Cardiomyopathy, ischemic 06/05/2013  . Cerumen impaction    Bilateral  . CERUMEN IMPACTION, BILATERAL 02/03/2010   Qualifier: History of  By: Lenna Gilford MD, Deborra Medina   . Chicken pox as a child  . Chronic systolic CHF (congestive heart failure) ([place])   . COLONIC POLYPS 07/01/2008   Qualifier: Diagnosis of  By: Lenna Gilford MD, Deborra Medina   . Degenerative joint disease   . DEGENERATIVE JOINT DISEASE 11/16/2007   Qualifier: Diagnosis of  By: Julien Girt CMA, Marliss Czar    . Diverticulosis of colon   . DIVERTICULOSIS OF COLON 07/01/2008   Qualifier: Diagnosis of  By: Lenna Gilford MD, Deborra Medina   . Double vision 03/07/2017  . Essential hypertension 11/16/2007   Qualifier: Diagnosis of  By: Julien Girt CMA, Marliss Czar    . Fingernail abnormalities 03/07/2017  . FLANK PAIN, RIGHT 02/03/2010   Qualifier: History of  By: Lenna Gilford MD, Deborra Medina   . GERD 11/16/2007   Qualifier: Diagnosis of  By: Julien Girt CMA, Marliss Czar    . GERD (gastroesophageal reflux disease)   .  Gout   . GOUT 11/16/2007   Qualifier: Diagnosis of  By: Julien Girt CMA, Marliss Czar    . Hearing loss 11/24/2014  . Heart murmur   . History of shingles 10/27/2017  . Hypercholesterolemia   . HYPERCHOLESTEROLEMIA 11/16/2007   Qualifier: Diagnosis of  By: Julien Girt CMA, Marliss Czar    . Hypertension   . Ischemic cardiomyopathy    a. echo (09/05/13): EF 35%, diffuse HK worsened distal septal, mid/distal inferior and apical region, grade 1 diastolic dysfunction, mild LAE.    [name] Kidney stone 08/17/2011  . Loss of hearing   . Lumbar back pain   . Measles as a child  . Medicare annual wellness visit, subsequent 11/24/2014   Sees Dr Delman Cheadle for dermatology Sees Dr Roni Bread of Urology Sees Dr Stanford Breed of cardiology Sees Dr Virginia Rochester of Opthamology No further colonoscopies warranted       . Mumps as a child  . Nephrolithiasis   . Pain in joint, lower leg 07/29/2014  . PERIPHERAL VASCULAR DISEASE 11/16/2007   Qualifier: Diagnosis of  By: Julien Girt CMA, Marliss Czar     . Peripheral vascular disease ([place])   . Rectal bleeding 03/07/2017  . Shingles 07/29/2014  . Sun-damaged skin 05/31/2014   Allergies  Allergen Reactions  . Lisinopril     REACTION: dizziness  . Methocarbamol     REACTION: pt states "dizzy"  . Pregabalin     REACTION: pt states "dizzy"  . Ramipril     REACTION: hives and dizziness    Current Outpatient Medications:  .  allopurinol (ZYLOPRIM) 300 MG tablet, Take 1 tablet (300 mg total) by mouth daily., Disp: 90 tablet, Rfl: 1 .  aspirin EC 81 MG tablet, Take 81 mg by mouth every morning. , Disp: , Rfl:  .  B Complex-C (B-COMPLEX WITH VITAMIN C) tablet, Take 1 tablet by mouth daily., Disp: , Rfl:  .  Cholecalciferol (VITAMIN D-3 PO), Take 5,000 Units by mouth daily with breakfast. , Disp: , Rfl:  .  finasteride (PROSCAR) 5 MG tablet, Takes every third day, Disp: , Rfl:  .  folic acid (FOLVITE) 109 MCG tablet, Take 400 mcg by mouth 2 (two) times daily. , Disp: , Rfl:  .  isosorbide mononitrate (IMDUR) 30 MG 24 hr tablet, Take 3 tablets (90 mg total) by mouth daily., Disp: 270 tablet, Rfl: 3 .  losartan (COZAAR) 50 MG tablet, Take 1 tablet (50 mg total) by mouth daily., Disp: 90 tablet, Rfl: 3 .  metoprolol succinate (TOPROL-XL) 25 MG 24 hr tablet, Take 0.5 tablets (12.5 mg total) by mouth daily., Disp: 45 tablet, Rfl: 3 .  Misc Natural Products (OSTEO BI-FLEX ADV JOINT SHIELD) TABS, Take 1 tablet by mouth 2 (two) times daily. , Disp: , Rfl:  .  nitroGLYCERIN (NITROSTAT) 0.4 MG SL tablet, Place 1 tablet (0.4 mg total) under the tongue every 5 (five) minutes as needed. For chest pain., Disp: 25 tablet, Rfl: 6 .  simvastatin (ZOCOR) 40 MG tablet, TAKE 1 TABLET BY MOUTH EVERY EVENING, Disp: 90 tablet, Rfl: 0  Objective: BP 130/72 (BP Location: Left Arm, Patient Position: Sitting, Cuff Size: Normal)   Pulse (!) 51   Temp 98.1 F (36.7 C) (Oral)   Ht 6' (1.829 m)   Wt 172 lb 2 oz (78.1 kg)   SpO2 97%   BMI 23.34 kg/m  General: Awake,  appears stated age HEENT: MMM, EOMi Heart: RRR, no LE edema, no bruits noticed Lungs: CTAB, no rales, wheezes or rhonchi. No accessory  muscle use Abd: BS+, soft, NT, ND, no masses or organomegaly; rectal exam shows no fissuring or ext trauma, +ext and internal hemorrhoids, sphincter of good tone Neuro: DTR's equal and symmetric, CN's intact, no cerebellar signs MSK: 5/5 strength throughout Psych: Age appropriate judgment and insight, normal affect and mood  Assessment and Plan: TIA (transient ischemic attack) - Plan: MR Brain Wo Contrast  External hemorrhoid - Plan: CBC  Orders as above. Will hold off on other imaging at this time pending the results of his MRI. Cont ASA and statin.  Ck CBC, exam suggestive of hemorrhoids being cause of issue. For incontinence, offered referral to GI, but he declined.  Metamucil recommended instead. F/u prn, as originally scheduled with Dr. Charlett Blake.  The patient voiced understanding and agreement to the plan.  [place], DO 02/07/18  8:01 AM

## 2018-02-07 NOTE — Patient Instructions (Addendum)
If you don't hear anything in the next couple days about your labs or MRI, give [place] a call.   Try Metamucil to help with the incontinence issue.   Hemorrhoids Hemorrhoids are swollen veins in and around the rectum or anus. There are two types of hemorrhoids:  Internal hemorrhoids. These occur in the veins that are just inside the rectum. They may poke through to the outside and become irritated and painful.  External hemorrhoids. These occur in the veins that are outside of the anus and can be felt as a painful swelling or hard lump near the anus.  Most hemorrhoids do not cause serious problems, and they can be managed with home treatments such as diet and lifestyle changes. If home treatments do not help your symptoms, procedures can be done to shrink or remove the hemorrhoids. What are the causes? This condition is caused by increased pressure in the anal area. This pressure may result from various things, including:  Constipation.  Straining to have a bowel movement.  Diarrhea.  Pregnancy.  Obesity.  Sitting for long periods of time.  Heavy lifting or other activity that causes you to strain.  Anal sex.  What are the signs or symptoms? Symptoms of this condition include:  Pain.  Anal itching or irritation.  Rectal bleeding.  Leakage of stool (feces).  Anal swelling.  One or more lumps around the anus.  How is this diagnosed? This condition can often be diagnosed through a visual exam. Other exams or tests may also be done, such as:  Examination of the rectal area with a gloved hand (digital rectal exam).  Examination of the anal canal using a small tube (anoscope).  A blood test, if you have lost a significant amount of blood.  A test to look inside the colon (sigmoidoscopy or colonoscopy).  How is this treated? This condition can usually be treated at home. However, various procedures may be done if dietary changes, lifestyle changes, and other home  treatments do not help your symptoms. These procedures can help make the hemorrhoids smaller or remove them completely. Some of these procedures involve surgery, and others do not. Common procedures include:  Rubber band ligation. Rubber bands are placed at the base of the hemorrhoids to cut off the blood supply to them.  Sclerotherapy. Medicine is injected into the hemorrhoids to shrink them.  Infrared coagulation. A type of light energy is used to get rid of the hemorrhoids.  Hemorrhoidectomy surgery. The hemorrhoids are surgically removed, and the veins that supply them are tied off.  Stapled hemorrhoidopexy surgery. A circular stapling device is used to remove the hemorrhoids and use staples to cut off the blood supply to them.  Follow these instructions at home: Eating and drinking  Eat foods that have a lot of fiber in them, such as whole grains, beans, nuts, fruits, and vegetables. Ask your health care provider about taking products that have added fiber (fiber supplements).  Drink enough fluid to keep your urine clear or pale yellow. Managing pain and swelling  Take warm sitz baths for 20 minutes, 3-4 times a day to ease pain and discomfort.  If directed, apply ice to the affected area. Using ice packs between sitz baths may be helpful. ? Put ice in a plastic bag. ? Place a towel between your skin and the bag. ? Leave the ice on for 20 minutes, 2-3 times a day. General instructions  Take over-the-counter and prescription medicines only as told by your health  care provider.  Use medicated creams or suppositories as told.  Exercise regularly.  Go to the bathroom when you have the urge to have a bowel movement. Do not wait.  Avoid straining to have bowel movements.  Keep the anal area dry and clean. Use wet toilet paper or moist towelettes after a bowel movement.  Do not sit on the toilet for long periods of time. This increases blood pooling and pain. Contact a health  care provider if:  You have increasing pain and swelling that are not controlled by treatment or medicine.  You have uncontrolled bleeding.  You have difficulty having a bowel movement, or you are unable to have a bowel movement.  You have pain or inflammation outside the area of the hemorrhoids. This information is not intended to replace advice given to you by your health care provider. Make sure you discuss any questions you have with your health care provider. Document Released: 11/11/2000 Document Revised: 04/13/2016 Document Reviewed: 07/29/2015 Elsevier Interactive Patient Education  [employer].

## 2018-02-24 ENCOUNTER — Ambulatory Visit (HOSPITAL_BASED_OUTPATIENT_CLINIC_OR_DEPARTMENT_OTHER)
Admission: RE | Admit: 2018-02-24 | Discharge: 2018-02-24 | Disposition: A | Discharge status: Home / Self Care | Payer: Medicare Other | Source: Ambulatory Visit | Attending: Family Medicine | Admitting: Family Medicine

## 2018-02-24 DIAGNOSIS — I639 Cerebral infarction, unspecified: Secondary | ICD-10-CM | POA: Diagnosis not present

## 2018-02-24 DIAGNOSIS — R42 Dizziness and giddiness: Secondary | ICD-10-CM | POA: Diagnosis not present

## 2018-02-24 DIAGNOSIS — G459 Transient cerebral ischemic attack, unspecified: Secondary | ICD-10-CM | POA: Insufficient documentation

## 2018-02-27 ENCOUNTER — Encounter: Payer: Self-pay | Admitting: Family Medicine

## 2018-02-27 ENCOUNTER — Ambulatory Visit (INDEPENDENT_AMBULATORY_CARE_PROVIDER_SITE_OTHER): Payer: Medicare Other | Admitting: Family Medicine

## 2018-02-27 VITALS — BP 132/74 | HR 55 | Temp 97.6°F | Resp 16 | Ht 72.05 in | Wt 172.2 lb

## 2018-02-27 DIAGNOSIS — N501 Vascular disorders of male genital organs: Secondary | ICD-10-CM

## 2018-02-27 DIAGNOSIS — I10 Essential (primary) hypertension: Secondary | ICD-10-CM | POA: Diagnosis not present

## 2018-02-27 DIAGNOSIS — M79671 Pain in right foot: Secondary | ICD-10-CM | POA: Diagnosis not present

## 2018-02-27 DIAGNOSIS — I25700 Atherosclerosis of coronary artery bypass graft(s), unspecified, with unstable angina pectoris: Secondary | ICD-10-CM

## 2018-02-27 DIAGNOSIS — E7800 Pure hypercholesterolemia, unspecified: Secondary | ICD-10-CM | POA: Diagnosis not present

## 2018-02-27 NOTE — Progress Notes (Signed)
Subjective:  I acted as a Education administrator for [employer]. [name], [place]   Patient ID: Daniel Reeves, male    DOB: [birth year], 84 y.o.   MRN: [id number]  Chief Complaint  Patient presents with  . Follow-up    HPI  Patient is in today for follow up and is in today with 2 new concerns. He awoke with a stinging sensation in his scrotum this morning, he saw a dark spot and picked at it causing bleeding to occure. It has been a couple of hours and it continues to bleed. Also notes right heal pain s/p stepping on a nail head back in January. No skin opening, redness or warmth noted but pain persists and he is afraid he may have caused a fracture. Denies CP/palp/SOB/HA/congestion/fevers/GI or GU c/o. Taking meds as prescribed  Patient Care Team: [name], MD as PCP - General (Family Medicine) Irine Seal, MD as Consulting Physician (Urology) Stanford Breed Denice Bors, MD as Consulting Physician (Cardiology) Iona Beard, [place] as Consulting Physician (Optometry) Arlyn Leak, MD (Dentistry)   Past Medical History:  Diagnosis Date  . Abdominal aortic aneurysm ([place])    a. [place] (1/14):  3.3 x 3.4 cm => f/u 11/2013  . Amebic dysentery   . AMEBIC DYSENTERY 11/19/2007   Qualifier: History of  By: Lenna Gilford MD, Deborra Medina   . Anemia 03/07/2017  . Anxiety   . ANXIETY 11/19/2007   Qualifier: Diagnosis of  By: Lenna Gilford MD, Deborra Medina   . Arthritis 03/07/2017  . 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 BACK PAIN, LUMBAR 11/16/2007   Qualifier: Diagnosis of  By: Julien Girt CMA, [place]    . Benign prostatic hypertrophy   . BENIGN PROSTATIC HYPERTROPHY, HX OF 11/16/2007   Qualifier: Diagnosis of  By: Julien Girt CMA, Leigh    . BRBPR (bright red blood per rectum) 11/01/2016  . CAD (coronary artery disease)    a. s/p CABG in 1979 and 1993;  b. LHC (5/14):  LM, LAD, CFX and RCA occluded; L-LAD ok, dLAD occluded after insertion of LIMA, S-OM occluded, S-PDA/AM 80-90 => PCI  with Promus DES; EF 25%  . Cardiomyopathy, ischemic 06/05/2013  . Cerumen impaction    Bilateral  . CERUMEN IMPACTION, BILATERAL 02/03/2010   Qualifier: History of  By: Lenna Gilford MD, Deborra Medina   . Chicken pox as a child  . Chronic systolic CHF (congestive heart failure) ([place])   . COLONIC POLYPS 07/01/2008   Qualifier: Diagnosis of  By: Lenna Gilford MD, Deborra Medina   . Degenerative joint disease   . DEGENERATIVE JOINT DISEASE 11/16/2007   Qualifier: Diagnosis of  By: Julien Girt CMA, Marliss Czar    . Diverticulosis of colon   . DIVERTICULOSIS OF COLON 07/01/2008   Qualifier: Diagnosis of  By: Lenna Gilford MD, Deborra Medina   . Double vision 03/07/2017  . Essential hypertension 11/16/2007   Qualifier: Diagnosis of  By: Julien Girt CMA, Marliss Czar    . Fingernail abnormalities 03/07/2017  . FLANK PAIN, RIGHT 02/03/2010   Qualifier: History of  By: Lenna Gilford MD, Deborra Medina   . GERD 11/16/2007   Qualifier: Diagnosis of  By: Julien Girt CMA, Marliss Czar    . GERD (gastroesophageal reflux disease)   . Gout   . GOUT 11/16/2007   Qualifier: Diagnosis of  By: Julien Girt CMA, Marliss Czar    . Hearing loss 11/24/2014  . Heart murmur   . History of shingles 10/27/2017  . Hypercholesterolemia   . HYPERCHOLESTEROLEMIA 11/16/2007  Qualifier: Diagnosis of  By: Julien Girt CMA, [place]    . Hypertension   . Ischemic cardiomyopathy    a. echo (09/05/13): EF 35%, diffuse HK worsened distal septal, mid/distal inferior and apical region, grade 1 diastolic dysfunction, mild LAE.    [name] Kidney stone 08/17/2011  . Loss of hearing   . Lumbar back pain   . Measles as a child  . Medicare annual wellness visit, subsequent 11/24/2014   Sees Dr Delman Cheadle for dermatology Sees Dr Roni Bread of Urology Sees Dr Stanford Breed of cardiology Sees Dr Virginia Rochester of Opthamology No further colonoscopies warranted       . Mumps as a child  . Nephrolithiasis   . Pain in joint, lower leg 07/29/2014  . PERIPHERAL VASCULAR DISEASE 11/16/2007   Qualifier: Diagnosis of  By: Julien Girt CMA, Marliss Czar    . Peripheral vascular disease ([place])   .  Rectal bleeding 03/07/2017  . Shingles 07/29/2014  . Sun-damaged skin 05/31/2014    Past Surgical History:  Procedure Laterality Date  . CORONARY ANGIOPLASTY WITH STENT PLACEMENT  04/18/2013   RCA       . CORONARY ARTERY BYPASS GRAFT  1979   x4 SVG-DIAG-LAD, SVG-OM-PDA  . CORONARY ARTERY BYPASS GRAFT  1993   Redo x5 by Dr Harlow Asa; [facility], SVG-OM, SVG-AM-PL  . Decompressive laminectomy  01/2006   L2 - scarum by Dr. Shellia Carwin  . HEMORRHOID SURGERY     fissure with hemorrhoid corrected at age 33  . INGUINAL HERNIA REPAIR  1994   Right by Dr Harlow Asa  . INGUINAL HERNIA REPAIR  1996   Left by Dr. Harlow Asa  . LEFT HEART CATHETERIZATION WITH CORONARY ANGIOGRAM N/A 09/23/2013   Procedure: LEFT HEART CATHETERIZATION WITH CORONARY ANGIOGRAM;  Surgeon: Blane Ohara, MD;  Location: [facility] CATH LAB;  Service: Cardiovascular;  Laterality: N/A;  . PERCUTANEOUS CORONARY STENT INTERVENTION (PCI-S) N/A 04/18/2013   Procedure: PERCUTANEOUS CORONARY STENT INTERVENTION (PCI-S);  Surgeon: Sherren Mocha, MD;  Location: [facility] CATH LAB;  Service: Cardiovascular;  Laterality: N/A;  . TONSILLECTOMY      Family History  Problem Relation Age of Onset  . Parkinsonism Brother   . Diabetes Maternal Grandmother   . Depression Daughter   . Other Son        4 stents  . Heart disease Son   . Diabetes Son        type 2    Social History   Socioeconomic History  . Marital status: Married    Spouse name: [name] x 64 yrs  . Number of children: Not on file  . Years of education: Not on file  . Highest education level: Not on file  Occupational History  . 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 Financial resource strain: Not on file  . Food insecurity:    Worry: Not on file    Inability: Not on file  . Transportation needs:    Medical: Not on file    Non-medical: Not on file  Tobacco Use  . Smoking status: Former Smoker    Last attempt to quit: 11/28/1944    Years since  quitting: 73.3  . Smokeless tobacco: Never Used  Substance and Sexual Activity  . Alcohol use: Yes    Alcohol/week: 1.2 oz    Types: 2 Standard drinks or equivalent per week    Comment: daily rum  or wine  . Drug use: No  . Sexual activity: Not Currently    Comment:  lives with wife, no dietary restrictions.   Lifestyle  . Physical activity:    Days per week: Not on file    Minutes per session: Not on file  . Stress: Not on file  Relationships  . Social connections:    Talks on phone: Not on file    Gets together: Not on file    Attends religious service: Not on file    Active member of club or organization: Not on file    Attends meetings of clubs or organizations: Not on file    Relationship status: Not on file  . Intimate partner violence:    Fear of current or ex partner: Not on file    Emotionally abused: Not on file    Physically abused: Not on file    Forced sexual activity: Not on file  Other Topics Concern  . Not on file  Social History Narrative   Married   7 children    Outpatient Medications Prior to Visit  Medication Sig Dispense Refill  . allopurinol (ZYLOPRIM) 300 MG tablet Take 1 tablet (300 mg total) by mouth daily. 90 tablet 1  . aspirin EC 81 MG tablet Take 81 mg by mouth every morning.     . B Complex-C (B-COMPLEX WITH VITAMIN C) tablet Take 1 tablet by mouth daily.    . Cholecalciferol (VITAMIN D-3 PO) Take 5,000 Units by mouth daily with breakfast.     . finasteride (PROSCAR) 5 MG tablet Takes every third day    . folic acid (FOLVITE) 785 MCG tablet Take 400 mcg by mouth 2 (two) times daily.     . isosorbide mononitrate (IMDUR) 30 MG 24 hr tablet Take 3 tablets (90 mg total) by mouth daily. 270 tablet 3  . losartan (COZAAR) 50 MG tablet Take 1 tablet (50 mg total) by mouth daily. 90 tablet 3  . metoprolol succinate (TOPROL-XL) 25 MG 24 hr tablet Take 0.5 tablets (12.5 mg total) by mouth daily. 45 tablet 3  . Misc Natural Products (OSTEO BI-FLEX ADV  JOINT SHIELD) TABS Take 1 tablet by mouth 2 (two) times daily.     . nitroGLYCERIN (NITROSTAT) 0.4 MG SL tablet Place 1 tablet (0.4 mg total) under the tongue every 5 (five) minutes as needed. For chest pain. 25 tablet 6  . simvastatin (ZOCOR) 40 MG tablet TAKE 1 TABLET BY MOUTH EVERY EVENING 90 tablet 0   No facility-administered medications prior to visit.     Allergies  Allergen Reactions  . Lisinopril     REACTION: dizziness  . Methocarbamol     REACTION: pt states "dizzy"  . Pregabalin     REACTION: pt states "dizzy"  . Ramipril     REACTION: hives and dizziness    Review of Systems  Constitutional: Negative for fever and malaise/fatigue.  HENT: Negative for congestion.   Eyes: Negative for blurred vision.  Respiratory: Negative for shortness of breath.   Cardiovascular: Negative for chest pain, palpitations and leg swelling.  Gastrointestinal: Negative for abdominal pain, blood in stool and nausea.  Genitourinary: Negative for dysuria and frequency.  Musculoskeletal: Positive for joint pain. Negative for falls.  Skin: Negative for rash.  Neurological: Negative for dizziness, loss of consciousness and headaches.  Endo/Heme/Allergies: Negative for environmental allergies.  Psychiatric/Behavioral: Negative for depression. The patient is not nervous/anxious.        Objective:    Physical Exam  Constitutional: He is oriented to person, place, and time. He appears well-developed and well-nourished. No  distress.  HENT:  Head: Normocephalic and atraumatic.  Nose: Nose normal.  Eyes: Right eye exhibits no discharge. Left eye exhibits no discharge.  Neck: Normal range of motion. Neck supple.  Cardiovascular: Normal rate and regular rhythm.  Pulmonary/Chest: Effort normal and breath sounds normal.  Abdominal: Soft. Bowel sounds are normal. There is no tenderness.  Musculoskeletal: He exhibits no edema.  Neurological: He is alert and oriented to person, place, and time.    Skin: Skin is warm and dry.  cleeding noted on scrotal sac. Very small punctate opening is cauterized with silver nitrate he tolerates it well. Area covered with antibiotic ointment and a bandage  Psychiatric: He has a normal mood and affect.  Nursing note and vitals reviewed.   BP 132/74 (BP Location: Left Arm, Patient Position: Sitting, Cuff Size: Normal)   Pulse (!) 55   Temp 97.6 F (36.4 C) (Oral)   Resp 16   Ht 6' 0.05" (1.83 m)   Wt 172 lb 3.2 oz (78.1 kg)   SpO2 98%   BMI 23.32 kg/m  Wt Readings from Last 3 Encounters:  02/27/18 172 lb 3.2 oz (78.1 kg)  02/07/18 172 lb 2 oz (78.1 kg)  02/04/18 170 lb (77.1 kg)   BP Readings from Last 3 Encounters:  02/27/18 132/74  02/07/18 130/72  02/04/18 131/62     Immunization History  Administered Date(s) Administered  . Influenza, High Dose Seasonal PF 08/25/2016, 09/04/2017  . Influenza,inj,Quad PF,6+ Mos 10/10/2013, 11/24/2014, 11/16/2015  . Pneumococcal Conjugate-13 11/16/2015  . Pneumococcal Polysaccharide-23 07/11/2013  . Tdap 07/11/2013  . Zoster 12/17/2015    Health Maintenance  Topic Date Due  . INFLUENZA VACCINE  06/28/2018  . TETANUS/TDAP  07/12/2023  . PNA vac Low Risk Adult  Completed    Lab Results  Component Value Date   WBC 6.5 02/07/2018   HGB 13.8 02/07/2018   HCT 40.2 02/07/2018   PLT 185.0 02/07/2018   GLUCOSE 116 (H) 02/04/2018   CHOL 85 10/27/2017   TRIG 102 10/27/2017   HDL 32 (L) 10/27/2017   LDLCALC 34 10/27/2017   ALT 19 02/04/2018   AST 27 02/04/2018   NA 139 02/04/2018   K 3.8 02/04/2018   CL 107 02/04/2018   CREATININE 0.99 02/04/2018   BUN 15 02/04/2018   CO2 25 02/04/2018   TSH 1.70 10/27/2017   PSA 4.29 (H) 02/08/2011   INR 1.09 02/04/2018    Lab Results  Component Value Date   TSH 1.70 10/27/2017   Lab Results  Component Value Date   WBC 6.5 02/07/2018   HGB 13.8 02/07/2018   HCT 40.2 02/07/2018   MCV 96.2 02/07/2018   PLT 185.0 02/07/2018   Lab Results   Component Value Date   NA 139 02/04/2018   K 3.8 02/04/2018   CO2 25 02/04/2018   GLUCOSE 116 (H) 02/04/2018   BUN 15 02/04/2018   CREATININE 0.99 02/04/2018   BILITOT 1.1 02/04/2018   ALKPHOS 66 02/04/2018   AST 27 02/04/2018   ALT 19 02/04/2018   PROT 6.5 02/04/2018   ALBUMIN 3.7 02/04/2018   CALCIUM 8.9 02/04/2018   ANIONGAP 7 02/04/2018   GFR 69.62 03/07/2017   Lab Results  Component Value Date   CHOL 85 10/27/2017   Lab Results  Component Value Date   HDL 32 (L) 10/27/2017   Lab Results  Component Value Date   LDLCALC 34 10/27/2017   Lab Results  Component Value Date   TRIG 102 10/27/2017  Lab Results  Component Value Date   CHOLHDL 2.7 10/27/2017   No results found for: HGBA1C       Assessment & Plan:   Problem List Items Addressed This Visit    HYPERCHOLESTEROLEMIA    Tolerating statin, encouraged heart healthy diet, avoid trans fats, minimize simple carbs and saturated fats. Increase exercise as tolerated      Essential hypertension    Well controlled, no changes to meds. Encouraged heart healthy diet such as the DASH diet and exercise as tolerated.       RESOLVED: Scrotal bleeding    He awoke with a stinging sensation in his scrotum this morning, he saw a dark spot and picked at it causing bleeding to occure. It has been a couple of hours and it continues to bleed. Area is cleaned with hydrogen peroxide and then the area is cauterized with silver nitrate. He tolerated this well. Area is covered with antibiotic ointment and bandaid. He is asked to take it easy today. Apply ice and pressure and cleanse gently daily with mild soap and warm water and then cover with bandaid.       Foot pain, right - Primary    A couple of months ago he stepped on a nailhead and he has had pain since then he wonders if he fractured something. Xray is ordered. If pain persists will need referral to podiatry.       Relevant Orders   DG Foot Complete Right      I  am having Daniel F. [place] "Dick" maintain his folic acid, OSTEO BI-FLEX ADV JOINT SHIELD, Cholecalciferol (VITAMIN D-3 PO), aspirin EC, finasteride, B-complex with vitamin C, nitroGLYCERIN, metoprolol succinate, losartan, isosorbide mononitrate, allopurinol, and simvastatin.  No orders of the defined types were placed in this encounter.   CMA served as Education administrator during this visit. History, Physical and Plan performed by medical provider. Documentation and orders reviewed and attested to.  Penni Homans, MD

## 2018-02-27 NOTE — Patient Instructions (Signed)
Foot Pain Many things can cause foot pain. Some common causes are:  An injury.  A sprain.  Arthritis.  Blisters.  Bunions.  Follow these instructions at home: Pay attention to any changes in your symptoms. Take these actions to help with your discomfort:  If directed, put ice on the affected area: ? Put ice in a plastic bag. ? Place a towel between your skin and the bag. ? Leave the ice on for 15-20 minutes, 3?4 times a day for 2 days.  Take over-the-counter and prescription medicines only as told by your health care provider.  Wear comfortable, supportive shoes that fit you well. Do not wear high heels.  Do not stand or walk for long periods of time.  Do not lift a lot of weight. This can put added pressure on your feet.  Do stretches to relieve foot pain and stiffness as told by your health care provider.  Rub your foot gently.  Keep your feet clean and dry.  Contact a health care provider if:  Your pain does not get better after a few days of self-care.  Your pain gets worse.  You cannot stand on your foot. Get help right away if:  Your foot is numb or tingling.  Your foot or toes are swollen.  Your foot or toes turn white or blue.  You have warmth and redness along your foot. This information is not intended to replace advice given to you by your health care provider. Make sure you discuss any questions you have with your health care provider. Document Released: 12/11/2015 Document Revised: 04/21/2016 Document Reviewed: 12/10/2014 Elsevier Interactive Patient Education  2018 [employer]  

## 2018-03-01 ENCOUNTER — Ambulatory Visit (HOSPITAL_BASED_OUTPATIENT_CLINIC_OR_DEPARTMENT_OTHER)
Admission: RE | Admit: 2018-03-01 | Discharge: 2018-03-01 | Disposition: A | Discharge status: Home / Self Care | Payer: Medicare Other | Source: Ambulatory Visit | Attending: Family Medicine | Admitting: Family Medicine

## 2018-03-01 DIAGNOSIS — S99921A Unspecified injury of right foot, initial encounter: Secondary | ICD-10-CM | POA: Diagnosis not present

## 2018-03-01 DIAGNOSIS — N501 Vascular disorders of male genital organs: Secondary | ICD-10-CM | POA: Insufficient documentation

## 2018-03-01 DIAGNOSIS — M7731 Calcaneal spur, right foot: Secondary | ICD-10-CM | POA: Insufficient documentation

## 2018-03-01 DIAGNOSIS — M79671 Pain in right foot: Secondary | ICD-10-CM | POA: Diagnosis not present

## 2018-03-01 NOTE — Assessment & Plan Note (Signed)
Tolerating statin, encouraged heart healthy diet, avoid trans fats, minimize simple carbs and saturated fats. Increase exercise as tolerated 

## 2018-03-01 NOTE — Assessment & Plan Note (Signed)
A couple of months ago he stepped on a nailhead and he has had pain since then he wonders if he fractured something. Xray is ordered. If pain persists will need referral to podiatry.

## 2018-03-01 NOTE — Assessment & Plan Note (Signed)
He awoke with a stinging sensation in his scrotum this morning, he saw a dark spot and picked at it causing bleeding to occure. It has been a couple of hours and it continues to bleed. Area is cleaned with hydrogen peroxide and then the area is cauterized with silver nitrate. He tolerated this well. Area is covered with antibiotic ointment and bandaid. He is asked to take it easy today. Apply ice and pressure and cleanse gently daily with mild soap and warm water and then cover with bandaid.

## 2018-03-01 NOTE — Assessment & Plan Note (Signed)
Well controlled, no changes to meds. Encouraged heart healthy diet such as the DASH diet and exercise as tolerated.  °

## 2018-03-20 ENCOUNTER — Other Ambulatory Visit: Payer: Self-pay

## 2018-03-20 ENCOUNTER — Other Ambulatory Visit: Payer: Self-pay | Admitting: Family Medicine

## 2018-03-20 DIAGNOSIS — E7800 Pure hypercholesterolemia, unspecified: Secondary | ICD-10-CM

## 2018-03-20 DIAGNOSIS — I25700 Atherosclerosis of coronary artery bypass graft(s), unspecified, with unstable angina pectoris: Secondary | ICD-10-CM

## 2018-03-20 DIAGNOSIS — I10 Essential (primary) hypertension: Secondary | ICD-10-CM

## 2018-03-20 MED ORDER — METOPROLOL SUCCINATE ER 25 MG PO TB24: 12.5000 mg | ORAL_TABLET | Freq: Every day | ORAL | 0 refills | Status: DC

## 2018-03-22 ENCOUNTER — Other Ambulatory Visit: Payer: Self-pay

## 2018-03-22 MED ORDER — LOSARTAN POTASSIUM 50 MG PO TABS: 50.0000 mg | ORAL_TABLET | Freq: Every day | ORAL | 0 refills | Status: DC

## 2018-03-22 NOTE — Telephone Encounter (Signed)
REFILL 

## 2018-03-23 ENCOUNTER — Other Ambulatory Visit: Payer: Self-pay

## 2018-03-23 ENCOUNTER — Encounter: Payer: Self-pay | Admitting: Family Medicine

## 2018-03-27 ENCOUNTER — Other Ambulatory Visit: Payer: Self-pay | Admitting: *Deleted

## 2018-03-27 MED ORDER — LOSARTAN POTASSIUM 50 MG PO TABS: 50.0000 mg | ORAL_TABLET | Freq: Every day | ORAL | 0 refills | Status: DC

## 2018-03-28 ENCOUNTER — Other Ambulatory Visit: Payer: Self-pay

## 2018-03-28 MED ORDER — LOSARTAN POTASSIUM 50 MG PO TABS: 50.0000 mg | ORAL_TABLET | Freq: Every day | ORAL | 0 refills | Status: DC

## 2018-04-10 NOTE — Progress Notes (Signed)
HPI: FU CAD; s/p CABG in 1979 and 1993, ischemic CM, systolic CHF, AAA, HTN, HL. Patient underwent cardiac catheterization in May of 2014. The left main, LAD, circumflex and RCA were occluded. The LIMA to the LAD was patent and the distal LAD was occluded after the insertion. Saphenous vein graft to the obtuse marginal was occluded. Saphenous vein graft to the acute marginal and PDA had a high-grade lesion prior to insertion into the PDA of 80-90%. Ejection fraction was 25%. PCI: Promus Premier (3.5x12 mm) DES to the [facility]. Repeat catheterization in October 2014 because of recurrent chest pain. The stent placed in the saphenous vein graft to the PDA had mild in-stent restenosis. Medical therapy recommended. Nuclear study 2/17 showed EF 35, inferolateral scar, no ischemia. Last echocardiogram February 2018 showed ejection fraction 60-63%, grade 1 diastolic dysfunction, mild to moderate aortic insufficiency, mild mitral regurgitation. Abdominal ultrasound February 2018 showed abdominal aortic aneurysm measuring 3.4 x 3.5 cm. Carotid Dopplers February 2018 showed less than 50% bilateral stenosis.   Had TIA March 2019.  Since he was last seen, the patient denies any dyspnea on exertion, orthopnea, PND, pedal edema, palpitations, syncope or chest pain.   Current Outpatient Medications  Medication Sig Dispense Refill  . allopurinol (ZYLOPRIM) 300 MG tablet Take 1 tablet (300 mg total) by mouth daily. 90 tablet 1  . aspirin EC 81 MG tablet Take 81 mg by mouth every morning.     . B Complex-C (B-COMPLEX WITH VITAMIN C) tablet Take 1 tablet by mouth daily.    . Cholecalciferol (VITAMIN D-3 PO) Take 5,000 Units by mouth daily with breakfast.     . finasteride (PROSCAR) 5 MG tablet Takes every third day    . folic acid (FOLVITE) 016 MCG tablet Take 400 mcg by mouth 2 (two) times daily.     . isosorbide mononitrate (IMDUR) 30 MG 24 hr tablet Take 3 tablets (90 mg total) by mouth daily. 270 tablet 3  .  losartan (COZAAR) 50 MG tablet Take 1 tablet (50 mg total) by mouth daily. MUST KEEP OV. 30 tablet 0  . metoprolol succinate (TOPROL-XL) 25 MG 24 hr tablet Take 0.5 tablets (12.5 mg total) by mouth daily. Please contact office for additional refills 15 tablet 0  . Misc Natural Products (OSTEO BI-FLEX ADV JOINT SHIELD) TABS Take 1 tablet by mouth 2 (two) times daily.     . nitroGLYCERIN (NITROSTAT) 0.4 MG SL tablet Place 1 tablet (0.4 mg total) under the tongue every 5 (five) minutes as needed. For chest pain. 25 tablet 6  . simvastatin (ZOCOR) 40 MG tablet TAKE 1 TABLET BY MOUTH EVERY EVENING 90 tablet 0   No current facility-administered medications for this visit.      Past Medical History:  Diagnosis Date  . Abdominal aortic aneurysm ([place])    a. [place] (1/14):  3.3 x 3.4 cm => f/u 11/2013  . Amebic dysentery   . AMEBIC DYSENTERY 11/19/2007   Qualifier: History of  By: Lenna Gilford MD, Deborra Medina   . Anemia 03/07/2017  . Anxiety   . ANXIETY 11/19/2007   Qualifier: Diagnosis of  By: Lenna Gilford MD, Deborra Medina   . Arthritis 03/07/2017  . Atherosclerosis of coronary artery bypass graft with unstable angina pectoris ([place]) 04/19/2013  . BACK PAIN, LUMBAR 11/16/2007   Qualifier: Diagnosis of  By: Julien Girt CMA, Leigh    . Benign prostatic hypertrophy   . BENIGN PROSTATIC HYPERTROPHY, HX OF 11/16/2007   Qualifier: Diagnosis  of  By: Julien Girt CMA, Marliss Czar    . BRBPR (bright red blood per rectum) 11/01/2016  . CAD (coronary artery disease)    a. s/p CABG in 1979 and 1993;  b. LHC (5/14):  LM, LAD, CFX and RCA occluded; L-LAD ok, dLAD occluded after insertion of LIMA, S-OM occluded, S-PDA/AM 80-90 => PCI with Promus DES; EF 25%  . Cardiomyopathy, ischemic 06/05/2013  . Cerumen impaction    Bilateral  . CERUMEN IMPACTION, BILATERAL 02/03/2010   Qualifier: History of  By: Lenna Gilford MD, Deborra Medina   . Chicken pox as a child  . Chronic systolic CHF (congestive heart failure) ([place])   . COLONIC POLYPS 07/01/2008   Qualifier: Diagnosis of   By: Lenna Gilford MD, Deborra Medina   . Degenerative joint disease   . DEGENERATIVE JOINT DISEASE 11/16/2007   Qualifier: Diagnosis of  By: Julien Girt CMA, Marliss Czar    . Diverticulosis of colon   . DIVERTICULOSIS OF COLON 07/01/2008   Qualifier: Diagnosis of  By: Lenna Gilford MD, Deborra Medina   . Double vision 03/07/2017  . Essential hypertension 11/16/2007   Qualifier: Diagnosis of  By: Julien Girt CMA, Marliss Czar    . Fingernail abnormalities 03/07/2017  . FLANK PAIN, RIGHT 02/03/2010   Qualifier: History of  By: Lenna Gilford MD, Deborra Medina   . GERD 11/16/2007   Qualifier: Diagnosis of  By: Julien Girt CMA, Marliss Czar    . GERD (gastroesophageal reflux disease)   . Gout   . GOUT 11/16/2007   Qualifier: Diagnosis of  By: Julien Girt CMA, Marliss Czar    . Hearing loss 11/24/2014  . Heart murmur   . History of shingles 10/27/2017  . Hypercholesterolemia   . HYPERCHOLESTEROLEMIA 11/16/2007   Qualifier: Diagnosis of  By: Julien Girt CMA, Marliss Czar    . Hypertension   . Ischemic cardiomyopathy    a. echo (09/05/13): EF 35%, diffuse HK worsened distal septal, mid/distal inferior and apical region, grade 1 diastolic dysfunction, mild LAE.    [name] Kidney stone 08/17/2011  . Loss of hearing   . Lumbar back pain   . Measles as a child  . Medicare annual wellness visit, subsequent 11/24/2014   Sees Dr Delman Cheadle for dermatology Sees Dr Roni Bread of Urology Sees Dr Stanford Breed of cardiology Sees Dr Virginia Rochester of Opthamology No further colonoscopies warranted       . Mumps as a child  . Nephrolithiasis   . Pain in joint, lower leg 07/29/2014  . PERIPHERAL VASCULAR DISEASE 11/16/2007   Qualifier: Diagnosis of  By: Julien Girt CMA, Marliss Czar    . Peripheral vascular disease ([place])   . Rectal bleeding 03/07/2017  . Shingles 07/29/2014  . Sun-damaged skin 05/31/2014    Past Surgical History:  Procedure Laterality Date  . CORONARY ANGIOPLASTY WITH STENT PLACEMENT  04/18/2013   RCA       . CORONARY ARTERY BYPASS GRAFT  1979   x4 SVG-DIAG-LAD, SVG-OM-PDA  . CORONARY ARTERY BYPASS GRAFT  1993   Redo x5 by Dr  Harlow Asa; [facility], SVG-OM, SVG-AM-PL  . Decompressive laminectomy  01/2006   L2 - scarum by Dr. Shellia Carwin  . HEMORRHOID SURGERY     fissure with hemorrhoid corrected at age 26  . INGUINAL HERNIA REPAIR  1994   Right by Dr Harlow Asa  . INGUINAL HERNIA REPAIR  1996   Left by Dr. Harlow Asa  . LEFT HEART CATHETERIZATION WITH CORONARY ANGIOGRAM N/A 09/23/2013   Procedure: LEFT HEART CATHETERIZATION WITH CORONARY ANGIOGRAM;  Surgeon: Blane Ohara, MD;  Location: [facility] CATH LAB;  Service:  Cardiovascular;  Laterality: N/A;  . PERCUTANEOUS CORONARY STENT INTERVENTION (PCI-S) N/A 04/18/2013   Procedure: PERCUTANEOUS CORONARY STENT INTERVENTION (PCI-S);  Surgeon: Sherren Mocha, MD;  Location: [facility] CATH LAB;  Service: Cardiovascular;  Laterality: N/A;  . TONSILLECTOMY      Social History   Socioeconomic History  . Marital status: Married    Spouse name: [name] x 64 yrs  . Number of children: Not on file  . Years of education: Not on file  . Highest education level: Not on file  Occupational History  . 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 Financial resource strain: Not on file  . Food insecurity:    Worry: Not on file    Inability: Not on file  . Transportation needs:    Medical: Not on file    Non-medical: Not on file  Tobacco Use  . Smoking status: Former Smoker    Last attempt to quit: 11/28/1944    Years since quitting: 73.4  . Smokeless tobacco: Never Used  Substance and Sexual Activity  . Alcohol use: Yes    Alcohol/week: 1.2 oz    Types: 2 Standard drinks or equivalent per week    Comment: daily rum  or wine  . Drug use: No  . Sexual activity: Not Currently    Comment: lives with wife, no dietary restrictions.   Lifestyle  . Physical activity:    Days per week: Not on file    Minutes per session: Not on file  . Stress: Not on file  Relationships  . Social connections:    Talks on phone: Not on file    Gets together: Not on file     Attends religious service: Not on file    Active member of club or organization: Not on file    Attends meetings of clubs or organizations: Not on file    Relationship status: Not on file  . Intimate partner violence:    Fear of current or ex partner: Not on file    Emotionally abused: Not on file    Physically abused: Not on file    Forced sexual activity: Not on file  Other Topics Concern  . Not on file  Social History Narrative   Married   7 children    Family History  Problem Relation Age of Onset  . Parkinsonism Brother   . Diabetes Maternal Grandmother   . Depression Daughter   . Other Son        4 stents  . Heart disease Son   . Diabetes Son        type 2    ROS: Foot pain but no fevers or chills, productive cough, hemoptysis, dysphasia, odynophagia, melena, hematochezia, dysuria, hematuria, rash, seizure activity, orthopnea, PND, pedal edema, claudication. Remaining systems are negative.  Physical Exam: Well-developed well-nourished in no acute distress.  Skin is warm and dry.  HEENT is normal.  Neck is supple.  Chest is clear to auscultation with normal expansion.  Cardiovascular exam is regular rate and rhythm.  Abdominal exam nontender or distended. No masses palpated. Extremities show no edema. neuro grossly intact   A/P  1 coronary artery disease-patient appears to be doing reasonably well on medical therapy. We will continue including aspirin and statin.  Continue dose of nitrates.  2 abdominal aortic aneurysm-we will arrange follow-up abdominal ultrasound.  3 hypertension-pressure is controlled.  Continue present medications.  4 hyperlipidemia-continue statin.  5 ischemic cardiomyopathy-continue ARB and  beta-blocker.  Patient is not showing signs of congestive heart failure.  Kirk Ruths, MD

## 2018-04-17 ENCOUNTER — Other Ambulatory Visit: Payer: Self-pay | Admitting: Cardiology

## 2018-04-18 ENCOUNTER — Ambulatory Visit (INDEPENDENT_AMBULATORY_CARE_PROVIDER_SITE_OTHER): Payer: Medicare Other | Admitting: Cardiology

## 2018-04-18 ENCOUNTER — Encounter: Payer: Self-pay | Admitting: Cardiology

## 2018-04-18 VITALS — BP 114/62 | HR 57 | Ht 72.05 in | Wt 171.8 lb

## 2018-04-18 DIAGNOSIS — I714 Abdominal aortic aneurysm, without rupture, unspecified: Secondary | ICD-10-CM

## 2018-04-18 DIAGNOSIS — E7800 Pure hypercholesterolemia, unspecified: Secondary | ICD-10-CM | POA: Diagnosis not present

## 2018-04-18 DIAGNOSIS — I25700 Atherosclerosis of coronary artery bypass graft(s), unspecified, with unstable angina pectoris: Secondary | ICD-10-CM | POA: Diagnosis not present

## 2018-04-18 DIAGNOSIS — I10 Essential (primary) hypertension: Secondary | ICD-10-CM | POA: Diagnosis not present

## 2018-04-18 DIAGNOSIS — I2510 Atherosclerotic heart disease of native coronary artery without angina pectoris: Secondary | ICD-10-CM

## 2018-04-18 NOTE — Patient Instructions (Signed)
Medication Instructions:   NO CHANGE  Testing/Procedures:  Your physician has requested that you have an abdominal aorta duplex. During this test, an ultrasound is used to evaluate the aorta. Allow 30 minutes for this exam. Do not eat after midnight the day before and avoid carbonated beverages AT [facility] [phone]  Follow-Up:  Your physician wants you to follow-up in: [place] will receive a reminder letter in the mail two months in advance. If you don't receive a letter, please call our office to schedule the follow-up appointment.   If you need a refill on your cardiac medications before your next appointment, please call your pharmacy.

## 2018-04-18 NOTE — Telephone Encounter (Signed)
Rx(s) sent to pharmacy electronically.  

## 2018-05-02 ENCOUNTER — Other Ambulatory Visit: Payer: Self-pay

## 2018-05-02 MED ORDER — ISOSORBIDE MONONITRATE ER 30 MG PO TB24: 90.0000 mg | ORAL_TABLET | Freq: Every day | ORAL | 3 refills | Status: DC

## 2018-05-14 ENCOUNTER — Ambulatory Visit (HOSPITAL_BASED_OUTPATIENT_CLINIC_OR_DEPARTMENT_OTHER)
Admission: RE | Admit: 2018-05-14 | Discharge: 2018-05-14 | Disposition: A | Discharge status: Home / Self Care | Payer: Medicare Other | Source: Ambulatory Visit | Attending: Cardiology | Admitting: Cardiology

## 2018-05-14 DIAGNOSIS — I714 Abdominal aortic aneurysm, without rupture, unspecified: Secondary | ICD-10-CM

## 2018-05-15 ENCOUNTER — Encounter: Payer: Self-pay | Admitting: *Deleted

## 2018-05-15 NOTE — Progress Notes (Signed)
This encounter was created in error - please disregard.

## 2018-06-05 ENCOUNTER — Other Ambulatory Visit: Payer: Self-pay | Admitting: Family Medicine

## 2018-06-17 ENCOUNTER — Other Ambulatory Visit: Payer: Self-pay | Admitting: Family Medicine

## 2018-06-17 DIAGNOSIS — E7800 Pure hypercholesterolemia, unspecified: Secondary | ICD-10-CM

## 2018-06-17 DIAGNOSIS — I25700 Atherosclerosis of coronary artery bypass graft(s), unspecified, with unstable angina pectoris: Secondary | ICD-10-CM

## 2018-06-17 DIAGNOSIS — I10 Essential (primary) hypertension: Secondary | ICD-10-CM

## 2018-07-03 ENCOUNTER — Other Ambulatory Visit: Payer: Self-pay | Admitting: Cardiology

## 2018-07-04 NOTE — Telephone Encounter (Signed)
Rx sent to pharmacy   

## 2018-08-06 ENCOUNTER — Other Ambulatory Visit: Payer: Self-pay | Admitting: Cardiology

## 2018-08-06 MED ORDER — LOSARTAN POTASSIUM 50 MG PO TABS: 50.0000 mg | ORAL_TABLET | Freq: Every day | ORAL | 1 refills | Status: DC

## 2018-08-06 NOTE — Telephone Encounter (Signed)
Rx request sent to pharmacy.  

## 2018-08-06 NOTE — Telephone Encounter (Signed)
New message   *STAT* If patient is at the pharmacy, call can be transferred to refill team.   1. Which medications need to be refilled? (please list name of each medication and dose if known) losartan (COZAAR) 50 MG tablet  2. Which pharmacy/location (including street and city if local pharmacy) is medication to be sent to?[id number] - [place], [place] - [address] AT [facility] [address]  3. Do they need a 30 day or 90 day supply? [place]

## 2018-09-01 ENCOUNTER — Other Ambulatory Visit: Payer: Self-pay | Admitting: Family Medicine

## 2018-09-11 ENCOUNTER — Other Ambulatory Visit: Payer: Self-pay | Admitting: Family Medicine

## 2018-09-11 DIAGNOSIS — I25700 Atherosclerosis of coronary artery bypass graft(s), unspecified, with unstable angina pectoris: Secondary | ICD-10-CM

## 2018-09-11 DIAGNOSIS — E7800 Pure hypercholesterolemia, unspecified: Secondary | ICD-10-CM

## 2018-09-11 DIAGNOSIS — I10 Essential (primary) hypertension: Secondary | ICD-10-CM

## 2018-09-11 IMAGING — MR MR HEAD W/O CM
10 of 11 series · 43 of 48 positions shown · non-contrast
Comparison: Head CT 02/04/2018

CLINICAL DATA: Single episode of vertigo with blurred vision and
gait disturbance occurring earlier this month. No current symptoms.

EXAM:
MRI HEAD WITHOUT CONTRAST
TECHNIQUE: Multiplanar, multiecho pulse sequences of the brain and surrounding
structures were obtained without intravenous contrast.

[Series 2: T1 · sagittal · 5.0mm · 0.45mm/px · 2 of 23 slices shown]
[im 1/23]
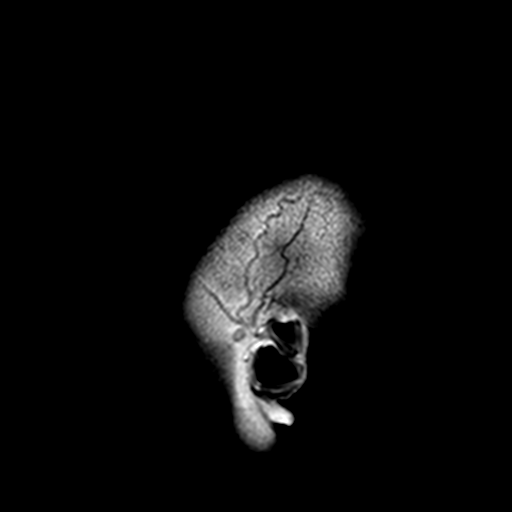
[im 23/23]
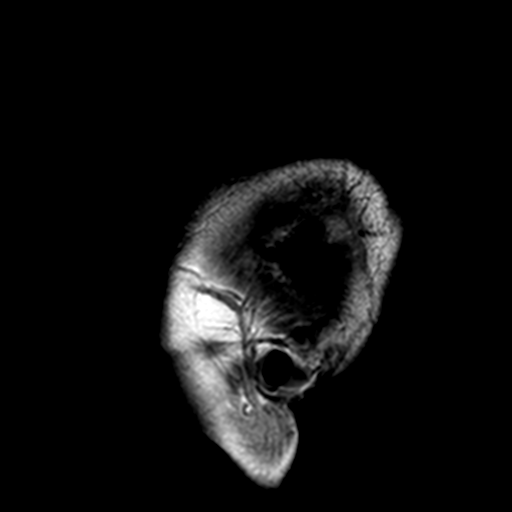

[Series 3: DWI · axial · 3.0mm · 1.86mm/px · z∈[-54,+107]mm · 10 of 99 slices shown (1 of 4)]
[im 1/99]
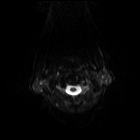
[im 11/99]
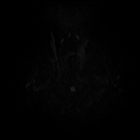
[im 22/99]
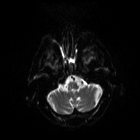
[im 33/99]
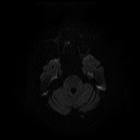
[im 44/99]
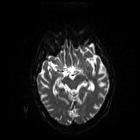
[im 55/99]
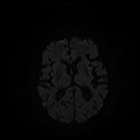
[im 66/99]
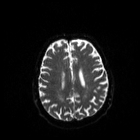
[im 77/99]
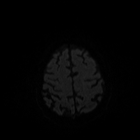
[im 88/99]
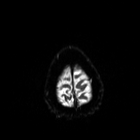
[im 99/99]
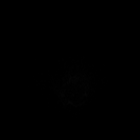

[Series 4: DWI · axial · 3.0mm · 1.86mm/px · z∈[-54,+107]mm · 5 of 50 slices shown (2 of 4)]
[im 1/50]
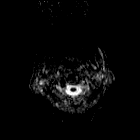
[im 13/50]
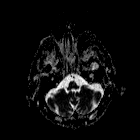
[im 25/50]
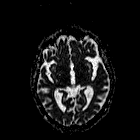
[im 37/50]
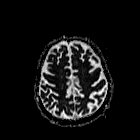
[im 50/50]
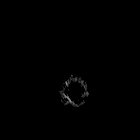

[Series 5: DWI · coronal · 3.0mm · 1.46mm/px · 10 of 100 slices shown (3 of 4)]
[im 1/100]
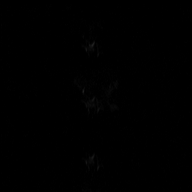
[im 12/100]
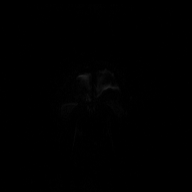
[im 23/100]
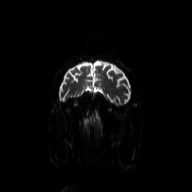
[im 34/100]
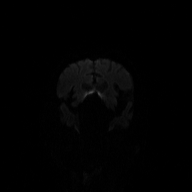
[im 45/100]
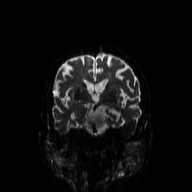
[im 56/100]
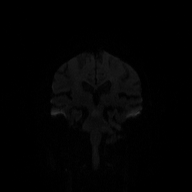
[im 67/100]
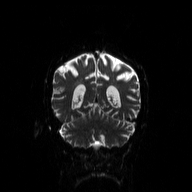
[im 78/100]
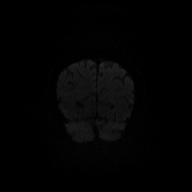
[im 89/100]
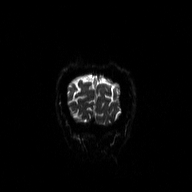
[im 100/100]
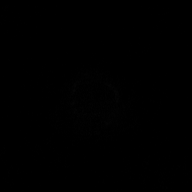

[Series 6: DWI · coronal · 3.0mm · 1.46mm/px · 5 of 50 slices shown (4 of 4)]
[im 1/50]
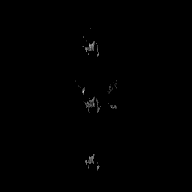
[im 13/50]
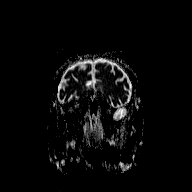
[im 25/50]
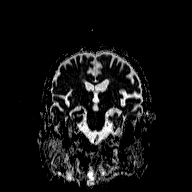
[im 37/50]
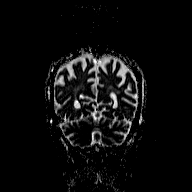
[im 50/50]
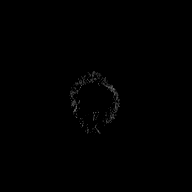

[Series 7: T2 · axial · 5.0mm · 0.45mm/px · z∈[-64,+97]mm · 2 of 24 slices shown (1 of 2)]
[im 1/24]
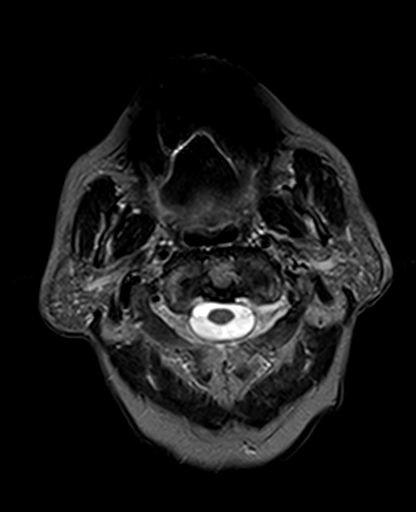
[im 24/24]
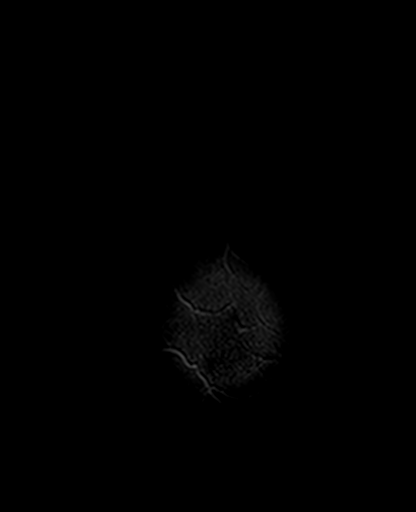

[Series 8: T2 · axial · 5.0mm · 0.45mm/px · z∈[-64,+97]mm · 2 of 24 slices shown (2 of 2)]
[im 1/24]
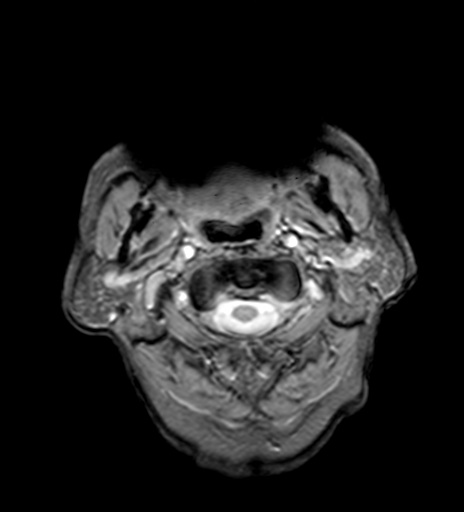
[im 24/24]
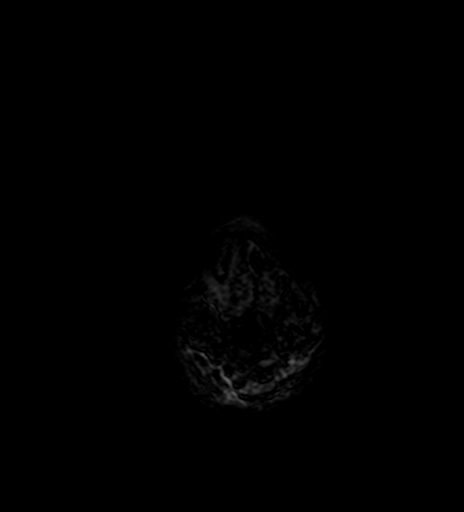

[Series 9: FLAIR · axial · 4.0mm · 0.45mm/px · z∈[-64,+98]mm · 3 of 28 slices shown]
[im 1/28]
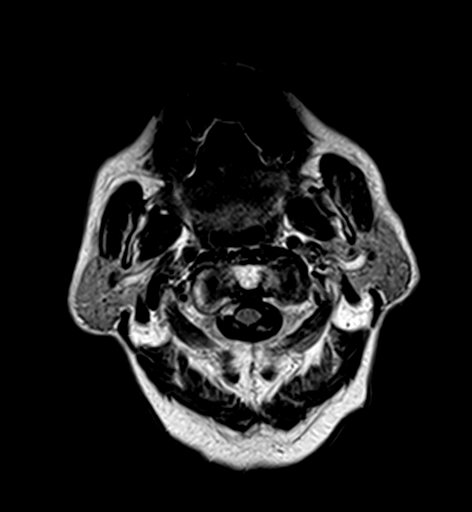
[im 14/28]
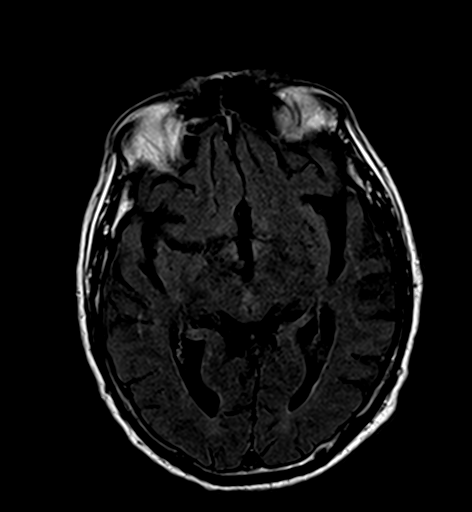
[im 28/28]
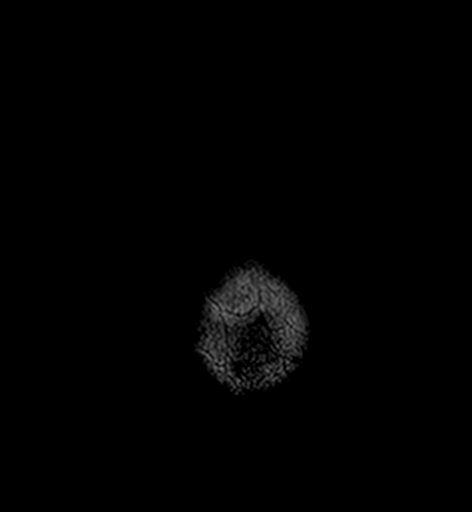

[Series 11: T2 post-contrast · coronal · 5.0mm · 0.45mm/px · 3 of 28 slices shown]
[im 1/28]
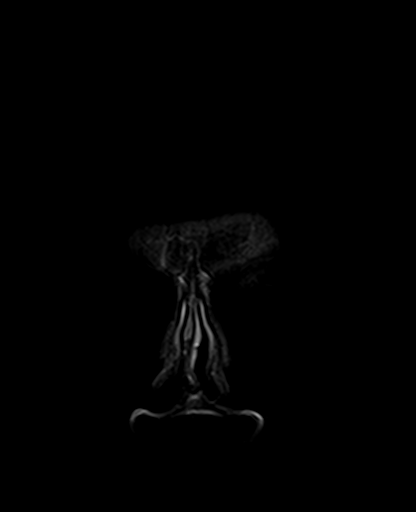
[im 14/28]
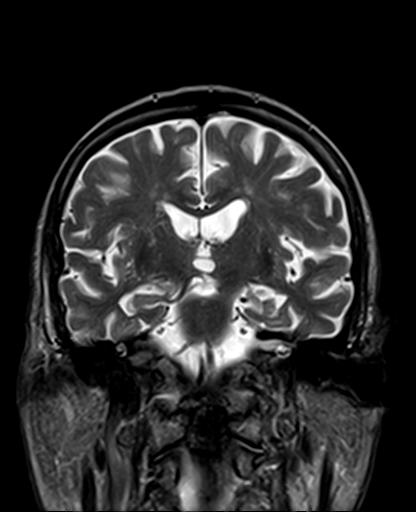
[im 28/28]
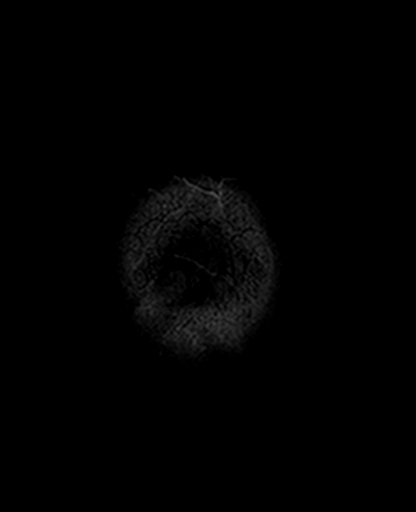

[Series 100: hx · axial · 10.0mm · 0.55mm/px · 1 of 9 slices shown]
[im 1/9]
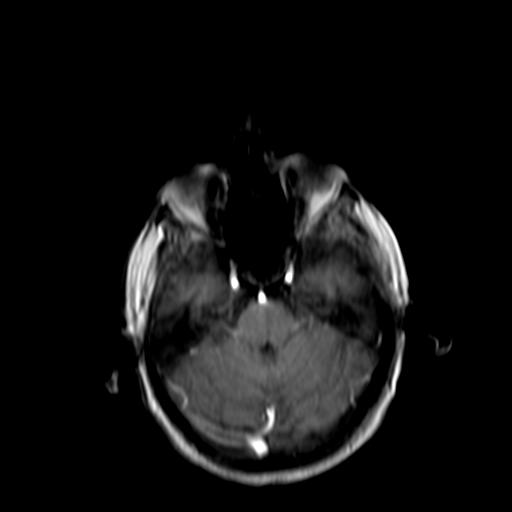

[43 of 48 positions shown; findings below may reference images not displayed]

FINDINGS: Brain: There is no evidence of acute infarct, mass, midline shift,
or extra-axial fluid collection. Chronic microhemorrhages are noted
in the left frontal and right parietal lobes. Cerebral atrophy is
not greater than expected for age. Bilateral cerebral white matter
T2 hyperintensities are nonspecific but compatible with mild chronic
small vessel ischemic disease. There are a few small chronic
infarcts in the left cerebellum.

Vascular: Major intracranial vascular flow voids are preserved.

Skull and upper cervical spine: Unremarkable bone marrow signal.

Sinuses/Orbits: Bilateral cataract extraction. Paranasal sinuses and
mastoid air cells are clear.

Other: None.
IMPRESSION: 1. No acute intracranial abnormality.
2. Mild chronic small vessel ischemic disease.
3. Chronic left cerebellar infarcts.

## 2018-09-26 ENCOUNTER — Ambulatory Visit: Payer: Self-pay | Admitting: *Deleted

## 2018-09-26 NOTE — Telephone Encounter (Signed)
Contacted pt regarding symptoms; he says that the pain is in the front of his mouth to left of center tooth; he says that he has pain when his lips runs across his mouth; the pt says that he believes the pain is coming from the gum or root not the teeth; his dentist is out of the office until Monday (Dr Renne Musca); recommendations made per nurse triage protocol; the pt is agreeable to seeing his PCP Dr Nani Ravens or any provider in that office; pt offered and accepted appointment with Dr Nani Ravens, [place], 09/27/18 at 0915; he verbalized understanding; also attempted to contact the pt's dentist without success [phone]; left  will route to office for notification of this upcoming appointment.     Reason for Disposition . [1] MILD-MODERATE mouth pain AND [2] present > 3 days  Answer Assessment - Initial Assessment Questions 1. ONSET: "When did the mouth start hurting?" (e.g., hours or days ago)      09/24/18 2. SEVERITY: "How bad is the pain?" (Scale 1-10; mild, moderate or severe)   - MILD (1-3):  doesn't interfere with eating or normal activities   - MODERATE (4-7): interferes with eating some solids and normal activities   - SEVERE (8-10):  excruciating pain, interferes with most normal activities   - SEVERE DYSPHAGIA: can't swallow liquids, drooling     mild 3. SORES: "Are there any sores or ulcers in the mouth?" If so, ask: "What part of the mouth are the sores in?"     no 4. FEVER: "Do you have a fever?" If so, ask: "What is your temperature, how was it measured, and when did it start?"     no 5. CAUSE: "What do you think is causing the mouth pain?"     Early abscess 6. OTHER SYMPTOMS: "Do you have any other symptoms?" (e.g., difficulty breathing)     no  Protocols used: MOUTH PAIN-A-AH

## 2018-09-26 NOTE — Telephone Encounter (Signed)
Contacted Pam, Secretary at Dr Elizebeth Brooking office regarding pt's complaint; she says that there is no on-call provider; she also says that she will speak with the orthodonitis's office across the hall to see if they are willing to see the pt; Pam also says that she will call the pt if he can be seen in the other office.

## 2018-09-27 ENCOUNTER — Ambulatory Visit: Payer: Medicare Other | Admitting: Family Medicine

## 2018-10-03 ENCOUNTER — Ambulatory Visit (INDEPENDENT_AMBULATORY_CARE_PROVIDER_SITE_OTHER): Payer: Medicare Other

## 2018-10-03 DIAGNOSIS — Z23 Encounter for immunization: Secondary | ICD-10-CM

## 2018-10-10 NOTE — Progress Notes (Signed)
HPI: FU CAD; s/p CABG in 1979 and 1993, ischemic CM, systolic CHF, AAA, HTN, HL. Patient underwent cardiac catheterization in May of 2014. The left main, LAD, circumflex and RCA were occluded. The LIMA to the LAD was patent and the distal LAD was occluded after the insertion. Saphenous vein graft to the obtuse marginal was occluded. Saphenous vein graft to the acute marginal and PDA had a high-grade lesion prior to insertion into the PDA of 80-90%. Ejection fraction was 25%. PCI: Promus Premier (3.5x12 mm) DES to the [facility].Repeat catheterization in October 2014 because of recurrent chest pain. The stent placed in the saphenous vein graft to the PDA had mild in-stent restenosis. Medical therapy recommended. Nuclear study 2/17 showed EF 35, inferolateral scar, no ischemia.Last echocardiogram February 2018 showed ejection fraction 02-58%, grade 1 diastolic dysfunction, mild to moderate aortic insufficiency, mild mitral regurgitation. Carotid Dopplers February 2018 showed less than 50% bilateral stenosis. Abdominal ultrasound June 2019 showed 3.9 cm abdominal aortic aneurysm.  Since he was last seen,  there is no dyspnea, chest pain, palpitations or syncope.  Current Outpatient Medications  Medication Sig Dispense Refill  . allopurinol (ZYLOPRIM) 300 MG tablet TAKE 1 TABLET(300 MG) BY MOUTH DAILY 90 tablet 0  . aspirin EC 81 MG tablet Take 81 mg by mouth every morning.     . B Complex-C (B-COMPLEX WITH VITAMIN C) tablet Take 1 tablet by mouth daily.    . Cholecalciferol (VITAMIN D-3 PO) Take 5,000 Units by mouth daily with breakfast.     . finasteride (PROSCAR) 5 MG tablet Takes every third day    . folic acid (FOLVITE) 527 MCG tablet Take 400 mcg by mouth 2 (two) times daily.     . isosorbide mononitrate (IMDUR) 30 MG 24 hr tablet Take 3 tablets (90 mg total) by mouth daily. 270 tablet 3  . losartan (COZAAR) 50 MG tablet Take 1 tablet (50 mg total) by mouth daily. 90 tablet 1  . metoprolol  succinate (TOPROL-XL) 25 MG 24 hr tablet TAKE 1/2 TABLET(12.5 MG) BY MOUTH DAILY 45 tablet 3  . metoprolol succinate (TOPROL-XL) 25 MG 24 hr tablet TAKE ONE-HALF TABLET BY MOUTH DAILY. PLEASE CALL AND SCHEDULE APPT BEFORE ANY FUTURE REFILLS WILL BE SENT 60 tablet 1  . Misc Natural Products (OSTEO BI-FLEX ADV JOINT SHIELD) TABS Take 1 tablet by mouth 2 (two) times daily.     . nitroGLYCERIN (NITROSTAT) 0.4 MG SL tablet Place 1 tablet (0.4 mg total) under the tongue every 5 (five) minutes as needed. For chest pain. 25 tablet 6  . simvastatin (ZOCOR) 40 MG tablet TAKE 1 TABLET BY MOUTH EVERY EVENING 90 tablet 0   No current facility-administered medications for this visit.      Past Medical History:  Diagnosis Date  . Abdominal aortic aneurysm ([place])    a. [place] (1/14):  3.3 x 3.4 cm => f/u 11/2013  . Amebic dysentery   . AMEBIC DYSENTERY 11/19/2007   Qualifier: History of  By: Lenna Gilford MD, Deborra Medina   . Anemia 03/07/2017  . Anxiety   . ANXIETY 11/19/2007   Qualifier: Diagnosis of  By: Lenna Gilford MD, Deborra Medina   . Arthritis 03/07/2017  . Atherosclerosis of coronary artery bypass graft with unstable angina pectoris ([place]) 04/19/2013  . BACK PAIN, LUMBAR 11/16/2007   Qualifier: Diagnosis of  By: Julien Girt CMA, Leigh    . Benign prostatic hypertrophy   . BENIGN PROSTATIC HYPERTROPHY, HX OF 11/16/2007   Qualifier: Diagnosis  of  By: Julien Girt CMA, Marliss Czar    . BRBPR (bright red blood per rectum) 11/01/2016  . CAD (coronary artery disease)    a. s/p CABG in 1979 and 1993;  b. LHC (5/14):  LM, LAD, CFX and RCA occluded; L-LAD ok, dLAD occluded after insertion of LIMA, S-OM occluded, S-PDA/AM 80-90 => PCI with Promus DES; EF 25%  . Cardiomyopathy, ischemic 06/05/2013  . Cerumen impaction    Bilateral  . CERUMEN IMPACTION, BILATERAL 02/03/2010   Qualifier: History of  By: Lenna Gilford MD, Deborra Medina   . Chicken pox as a child  . Chronic systolic CHF (congestive heart failure) ([place])   . COLONIC POLYPS 07/01/2008   Qualifier: Diagnosis  of  By: Lenna Gilford MD, Deborra Medina   . Degenerative joint disease   . DEGENERATIVE JOINT DISEASE 11/16/2007   Qualifier: Diagnosis of  By: Julien Girt CMA, Marliss Czar    . Diverticulosis of colon   . DIVERTICULOSIS OF COLON 07/01/2008   Qualifier: Diagnosis of  By: Lenna Gilford MD, Deborra Medina   . Double vision 03/07/2017  . Essential hypertension 11/16/2007   Qualifier: Diagnosis of  By: Julien Girt CMA, Marliss Czar    . Fingernail abnormalities 03/07/2017  . FLANK PAIN, RIGHT 02/03/2010   Qualifier: History of  By: Lenna Gilford MD, Deborra Medina   . GERD 11/16/2007   Qualifier: Diagnosis of  By: Julien Girt CMA, Marliss Czar    . GERD (gastroesophageal reflux disease)   . Gout   . GOUT 11/16/2007   Qualifier: Diagnosis of  By: Julien Girt CMA, Marliss Czar    . Hearing loss 11/24/2014  . Heart murmur   . History of shingles 10/27/2017  . Hypercholesterolemia   . HYPERCHOLESTEROLEMIA 11/16/2007   Qualifier: Diagnosis of  By: Julien Girt CMA, Marliss Czar    . Hypertension   . Ischemic cardiomyopathy    a. echo (09/05/13): EF 35%, diffuse HK worsened distal septal, mid/distal inferior and apical region, grade 1 diastolic dysfunction, mild LAE.    [name] Kidney stone 08/17/2011  . Loss of hearing   . Lumbar back pain   . Measles as a child  . Medicare annual wellness visit, subsequent 11/24/2014   Sees Dr Delman Cheadle for dermatology Sees Dr Roni Bread of Urology Sees Dr Stanford Breed of cardiology Sees Dr Virginia Rochester of Opthamology No further colonoscopies warranted       . Mumps as a child  . Nephrolithiasis   . Pain in joint, lower leg 07/29/2014  . PERIPHERAL VASCULAR DISEASE 11/16/2007   Qualifier: Diagnosis of  By: Julien Girt CMA, Marliss Czar    . Peripheral vascular disease ([place])   . Rectal bleeding 03/07/2017  . Shingles 07/29/2014  . Sun-damaged skin 05/31/2014    Past Surgical History:  Procedure Laterality Date  . CORONARY ANGIOPLASTY WITH STENT PLACEMENT  04/18/2013   RCA       . CORONARY ARTERY BYPASS GRAFT  1979   x4 SVG-DIAG-LAD, SVG-OM-PDA  . CORONARY ARTERY BYPASS GRAFT  1993   Redo x5 by Dr  Harlow Asa; [facility], SVG-OM, SVG-AM-PL  . Decompressive laminectomy  01/2006   L2 - scarum by Dr. Shellia Carwin  . HEMORRHOID SURGERY     fissure with hemorrhoid corrected at age 27  . INGUINAL HERNIA REPAIR  1994   Right by Dr Harlow Asa  . INGUINAL HERNIA REPAIR  1996   Left by Dr. Harlow Asa  . LEFT HEART CATHETERIZATION WITH CORONARY ANGIOGRAM N/A 09/23/2013   Procedure: LEFT HEART CATHETERIZATION WITH CORONARY ANGIOGRAM;  Surgeon: Blane Ohara, MD;  Location: [facility] CATH LAB;  Service:  Cardiovascular;  Laterality: N/A;  . PERCUTANEOUS CORONARY STENT INTERVENTION (PCI-S) N/A 04/18/2013   Procedure: PERCUTANEOUS CORONARY STENT INTERVENTION (PCI-S);  Surgeon: Sherren Mocha, MD;  Location[facility] CATH LAB;  Service: Cardiovascular;  Laterality: N/A;  . TONSILLECTOMY      Social History   Socioeconomic History  . Marital status: Married    Spouse name: [name] x 64 yrs  . Number of children: Not on file  . Years of education: Not on file  . Highest education level: Not on file  Occupational History  . 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 Financial resource strain: Not on file  . Food insecurity:    Worry: Not on file    Inability: Not on file  . Transportation needs:    Medical: Not on file    Non-medical: Not on file  Tobacco Use  . Smoking status: Former Smoker    Last attempt to quit: 11/28/1944    Years since quitting: 73.9  . Smokeless tobacco: Never Used  Substance and Sexual Activity  . Alcohol use: Yes    Alcohol/week: 2.0 standard drinks    Types: 2 Standard drinks or equivalent per week    Comment: daily rum  or wine  . Drug use: No  . Sexual activity: Not Currently    Comment: lives with wife, no dietary restrictions.   Lifestyle  . Physical activity:    Days per week: Not on file    Minutes per session: Not on file  . Stress: Not on file  Relationships  . Social connections:    Talks on phone: Not on file    Gets together: Not on  file    Attends religious service: Not on file    Active member of club or organization: Not on file    Attends meetings of clubs or organizations: Not on file    Relationship status: Not on file  . Intimate partner violence:    Fear of current or ex partner: Not on file    Emotionally abused: Not on file    Physically abused: Not on file    Forced sexual activity: Not on file  Other Topics Concern  . Not on file  Social History Narrative   Married   7 children    Family History  Problem Relation Age of Onset  . Parkinsonism Brother   . Diabetes Maternal Grandmother   . Depression Daughter   . Other Son        4 stents  . Heart disease Son   . Diabetes Son        type 2    ROS: Depression as his wife is having medical problems but no fevers or chills, productive cough, hemoptysis, dysphasia, odynophagia, melena, hematochezia, dysuria, hematuria, rash, seizure activity, orthopnea, PND, pedal edema, claudication. Remaining systems are negative.  Physical Exam: Well-developed well-nourished in no acute distress.  Skin is warm and dry.  HEENT is normal.  Neck is supple.  Chest is clear to auscultation with normal expansion.  Cardiovascular exam is regular rate and rhythm.  Abdominal exam nontender or distended. No masses palpated. Extremities show no edema. neuro grossly intact   A/P  1 coronary artery disease-patient is not having significant chest pain.  We will continue medical therapy.  Continue aspirin, statin and nitrates.  2 abdominal aortic aneurysm-follow-up ultrasound June 2020.  3 hypertension-patient's blood pressure is controlled today.  Continue present medications and follow.  4 hyperlipidemia-continue  statin.  5 ischemic cardiomyopathy-plan to continue medical therapy with ARB and beta-blocker.  Kirk Ruths, MD

## 2018-10-16 ENCOUNTER — Other Ambulatory Visit: Payer: Self-pay

## 2018-10-17 ENCOUNTER — Ambulatory Visit (INDEPENDENT_AMBULATORY_CARE_PROVIDER_SITE_OTHER): Payer: Medicare Other | Admitting: Cardiology

## 2018-10-17 ENCOUNTER — Encounter: Payer: Self-pay | Admitting: Cardiology

## 2018-10-17 VITALS — BP 134/60 | HR 54 | Ht 72.5 in | Wt 160.0 lb

## 2018-10-17 DIAGNOSIS — I10 Essential (primary) hypertension: Secondary | ICD-10-CM

## 2018-10-17 DIAGNOSIS — E7800 Pure hypercholesterolemia, unspecified: Secondary | ICD-10-CM

## 2018-10-17 DIAGNOSIS — I255 Ischemic cardiomyopathy: Secondary | ICD-10-CM | POA: Diagnosis not present

## 2018-10-17 DIAGNOSIS — I2510 Atherosclerotic heart disease of native coronary artery without angina pectoris: Secondary | ICD-10-CM

## 2018-10-17 NOTE — Patient Instructions (Signed)
Your physician recommends that you schedule a follow-up appointment in: 6 MONTHS WITH DR CRENSHAW PLEASE GIVE OUR OFFICE A CALL 2 MONTHS PRIOR TO THAT APPOINTMENT TIME TO SCHEDULE   

## 2018-11-27 ENCOUNTER — Other Ambulatory Visit: Payer: Self-pay | Admitting: Family Medicine

## 2018-11-27 ENCOUNTER — Telehealth: Payer: Self-pay | Admitting: Cardiology

## 2018-11-27 DIAGNOSIS — E7800 Pure hypercholesterolemia, unspecified: Secondary | ICD-10-CM

## 2018-11-27 DIAGNOSIS — I25700 Atherosclerosis of coronary artery bypass graft(s), unspecified, with unstable angina pectoris: Secondary | ICD-10-CM

## 2018-11-27 DIAGNOSIS — I10 Essential (primary) hypertension: Secondary | ICD-10-CM

## 2018-11-27 MED ORDER — LOSARTAN POTASSIUM 50 MG PO TABS: 50.0000 mg | ORAL_TABLET | Freq: Every day | ORAL | 1 refills | Status: DC

## 2018-11-27 MED ORDER — METOPROLOL SUCCINATE ER 25 MG PO TB24: ORAL_TABLET | ORAL | 3 refills | Status: DC

## 2018-11-27 MED ORDER — NITROGLYCERIN 0.4 MG SL SUBL: 0.4000 mg | SUBLINGUAL_TABLET | SUBLINGUAL | 6 refills | Status: DC | PRN

## 2018-11-27 NOTE — Telephone Encounter (Signed)
Refill sent to the pharmacy electronically.  

## 2018-11-27 NOTE — Telephone Encounter (Signed)
Copied from [place] [phone]. Topic: Quick Communication - Rx Refill/Question >> Nov 27, 2018  4:19 PM Ahmed Prima L wrote: Medication: allopurinol (ZYLOPRIM) 300 MG tablet & simvastatin (ZOCOR) 40 MG tablet  Has the patient contacted their pharmacy? Yes, was advised to call office. He wants switched to [employer] from [employer] (Agent: If no, request that the patient contact the pharmacy for the refill.) (Agent: If yes, when and what did the pharmacy advise?)  Preferred Pharmacy (with phone number or street name): [place] [place] [phone]    Agent: Please be advised that RX refills may take up to 3 business days. We ask that you follow-up with your pharmacy.

## 2018-11-27 NOTE — Telephone Encounter (Signed)
Requested medication (s) are due for refill today: Yes  Requested medication (s) are on the active medication list: Yes  Last refill:  Allopurinol 09/03/18; Simvastatin 11/2017  Future visit scheduled: No  Notes to clinic:  Labs overdue, unable to refill per protocol     Requested Prescriptions  Pending Prescriptions Disp Refills   allopurinol (ZYLOPRIM) 300 MG tablet 90 tablet 0    Sig: TAKE 1 TABLET(300 MG) BY MOUTH DAILY     Endocrinology:  Gout Agents Failed - 11/27/2018  4:30 PM      Failed - Uric Acid in normal range and within 360 days    Uric Acid, Serum  Date Value Ref Range Status  10/27/2017 3.4 (L) 4.0 - 8.0 mg/dL Final    Comment:    Therapeutic target for gout patients: <6.0 mg/dL .          Passed - Cr in normal range and within 360 days    Creat  Date Value Ref Range Status  10/27/2017 1.07 0.70 - 1.11 mg/dL Final    Comment:    For patients >83 years of age, the reference limit for Creatinine is approximately 13% higher for people identified as African-American. .    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 Desirable range <100 mg/dL for primary prevention;   <70 mg/dL for patients with CHD or diabetic patients  with > or = 2 CHD risk factors. [name] LDL-C is now calculated using the Martin-Hopkins  calculation, which is a validated novel method providing  better accuracy than the Friedewald equation in the  estimation of LDL-C.  Cresenciano Genre et al. Annamaria Helling. 6440;347(42): 2061-2068  (http://education.[employer].com/faq/FAQ164)          Failed - HDL in normal range and within 360 days    HDL  Date Value Ref Range Status  10/27/2017 32 (L) >40 mg/dL Final  Failed - Triglycerides in normal range and within 360 days    Triglycerides  Date Value Ref Range Status  10/27/2017 102 <150 mg/dL Final         Passed - Patient is not pregnant      Passed - Valid encounter within last 12 months    Recent Outpatient Visits          9 months ago Foot pain, right   Archivist at [place], [place]   9 months ago TIA (transient ischemic attack)   Archivist at [place], [place]   10 months ago Acute pain of right shoulder   Archivist at [facility], [place], [place]   11 months ago 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

## 2018-11-29 MED ORDER — SIMVASTATIN 40 MG PO TABS: ORAL_TABLET | ORAL | 0 refills | Status: DC

## 2018-11-29 MED ORDER — ALLOPURINOL 300 MG PO TABS: ORAL_TABLET | ORAL | 0 refills | Status: DC

## 2018-11-29 NOTE — Telephone Encounter (Signed)
Dr Charlett Blake -- please see refill protocol failures and advise?

## 2018-12-03 DIAGNOSIS — N5201 Erectile dysfunction due to arterial insufficiency: Secondary | ICD-10-CM | POA: Diagnosis not present

## 2018-12-03 DIAGNOSIS — N401 Enlarged prostate with lower urinary tract symptoms: Secondary | ICD-10-CM | POA: Diagnosis not present

## 2018-12-03 DIAGNOSIS — R351 Nocturia: Secondary | ICD-10-CM | POA: Diagnosis not present

## 2018-12-09 ENCOUNTER — Other Ambulatory Visit: Payer: Self-pay | Admitting: Family Medicine

## 2018-12-09 DIAGNOSIS — I10 Essential (primary) hypertension: Secondary | ICD-10-CM

## 2018-12-09 DIAGNOSIS — I25700 Atherosclerosis of coronary artery bypass graft(s), unspecified, with unstable angina pectoris: Secondary | ICD-10-CM

## 2018-12-09 DIAGNOSIS — E7800 Pure hypercholesterolemia, unspecified: Secondary | ICD-10-CM

## 2018-12-11 ENCOUNTER — Ambulatory Visit (HOSPITAL_BASED_OUTPATIENT_CLINIC_OR_DEPARTMENT_OTHER)
Admission: RE | Admit: 2018-12-11 | Discharge: 2018-12-11 | Disposition: A | Discharge status: Home / Self Care | Payer: Medicare Other | Source: Ambulatory Visit | Attending: Family Medicine | Admitting: Family Medicine

## 2018-12-11 ENCOUNTER — Ambulatory Visit (INDEPENDENT_AMBULATORY_CARE_PROVIDER_SITE_OTHER): Payer: Medicare Other | Admitting: Family Medicine

## 2018-12-11 VITALS — BP 126/59 | HR 55 | Temp 98.4°F | Resp 18 | Wt 164.2 lb

## 2018-12-11 DIAGNOSIS — M1611 Unilateral primary osteoarthritis, right hip: Secondary | ICD-10-CM | POA: Diagnosis not present

## 2018-12-11 DIAGNOSIS — M25551 Pain in right hip: Secondary | ICD-10-CM | POA: Diagnosis not present

## 2018-12-11 DIAGNOSIS — I10 Essential (primary) hypertension: Secondary | ICD-10-CM

## 2018-12-11 DIAGNOSIS — E7800 Pure hypercholesterolemia, unspecified: Secondary | ICD-10-CM

## 2018-12-11 DIAGNOSIS — I25700 Atherosclerosis of coronary artery bypass graft(s), unspecified, with unstable angina pectoris: Secondary | ICD-10-CM | POA: Diagnosis not present

## 2018-12-11 DIAGNOSIS — F411 Generalized anxiety disorder: Secondary | ICD-10-CM

## 2018-12-11 DIAGNOSIS — R600 Localized edema: Secondary | ICD-10-CM | POA: Diagnosis not present

## 2018-12-11 MED ORDER — ISOSORBIDE MONONITRATE ER 30 MG PO TB24: 90.0000 mg | ORAL_TABLET | Freq: Every day | ORAL | 3 refills | Status: DC

## 2018-12-11 MED ORDER — SIMVASTATIN 40 MG PO TABS: 40.0000 mg | ORAL_TABLET | Freq: Every evening | ORAL | 1 refills | Status: DC

## 2018-12-11 MED ORDER — METHYLPREDNISOLONE 4 MG PO TABS: ORAL_TABLET | ORAL | 0 refills | Status: DC

## 2018-12-11 NOTE — Patient Instructions (Signed)
Compression hose, knee high, low strength, 10-20 mmhg on in am off in pm  Hyland's leg cramp medicine under tongue for muscle cramps  Topical lidocaine by [employer]percre[employer], Icy Hot makes it  Edema  Edema is an abnormal buildup of fluids in the body tissues and under the skin. Swelling of the legs, feet, and ankles is a common symptom that becomes more likely as you get older. Swelling is also common in looser tissues, like around the eyes. When the affected area is squeezed, the fluid may move out of that spot and leave a dent for a few moments. This dent is called pitting edema. There are many possible causes of edema. Eating too much salt (sodium) and being on your feet or sitting for a long time can cause edema in your legs, feet, and ankles. Hot weather may make edema worse. Common causes of edema include:  Heart failure.  Liver or kidney disease.  Weak leg blood vessels.  Cancer.  An injury.  Pregnancy.  Medicines.  Being obese.  Low protein levels in the blood. Edema is usually painless. Your skin may look swollen or shiny. Follow these instructions at home:  Keep the affected body part raised (elevated) above the level of your heart when you are sitting or lying down.  Do not sit still or stand for long periods of time.  Do not wear tight clothing. Do not wear garters on your upper legs.  Exercise your legs to get your circulation going. This helps to move the fluid back into your blood vessels, and it may help the swelling go down.  Wear elastic bandages or support stockings to reduce swelling as told by your health care provider.  Eat a low-salt (low-sodium) diet to reduce fluid as told by your health care provider.  Depending on the cause of your swelling, you may need to limit how much fluid you drink (fluid restriction).  Take over-the-counter and prescription medicines only as told by your health care provider. Contact a health care provider if:  Your edema does  not get better with treatment.  You have heart, liver, or kidney disease and have symptoms of edema.  You have sudden and unexplained weight gain. Get help right away if:  You develop shortness of breath or chest pain.  You cannot breathe when you lie down.  You develop pain, redness, or warmth in the swollen areas.  You have heart, liver, or kidney disease and suddenly get edema.  You have a fever and your symptoms suddenly get worse. Summary  Edema is an abnormal buildup of fluids in the body tissues and under the skin.  Eating too much salt (sodium) and being on your feet or sitting for a long time can cause edema in your legs, feet, and ankles.  Keep the affected body part raised (elevated) above the level of your heart when you are sitting or lying down. This information is not intended to replace advice given to you by your health care provider. Make sure you discuss any questions you have with your health care provider. Document Released: 11/14/2005 Document Revised: 12/17/2016 Document Reviewed: 12/17/2016 Elsevier Interactive Patient Education  2019 [employer].

## 2018-12-14 NOTE — Progress Notes (Deleted)
HPI: FU CAD; s/p CABG in 1979 and 1993, ischemic CM, systolic CHF, AAA, HTN, HL. Patient underwent cardiac catheterization in May of 2014. The left main, LAD, circumflex and RCA were occluded. The LIMA to the LAD was patent and the distal LAD was occluded after the insertion. Saphenous vein graft to the obtuse marginal was occluded. Saphenous vein graft to the acute marginal and PDA had a high-grade lesion prior to insertion into the PDA of 80-90%. Ejection fraction was 25%. PCI: Promus Premier (3.5x12 mm) DES to the [facility].Repeat catheterization in October 2014 because of recurrent chest pain. The stent placed in the saphenous vein graft to the PDA had mild in-stent restenosis. Medical therapy recommended. Nuclear study 2/17 showed EF 35, inferolateral scar, no ischemia.Last echocardiogram February 2018 showed ejection fraction 33-29%, grade 1 diastolic dysfunction, mild to moderate aortic insufficiency, mild mitral regurgitation. Carotid Dopplers February 2018 showed less than 50% bilateral stenosis. Abdominal ultrasound June 2019 showed 3.9 cm abdominal aortic aneurysm.  Since he was last seen,  Current Outpatient Medications  Medication Sig Dispense Refill  . allopurinol (ZYLOPRIM) 300 MG tablet TAKE 1 TABLET(300 MG) BY MOUTH DAILY 90 tablet 0  . aspirin EC 81 MG tablet Take 81 mg by mouth every morning.     . B Complex-C (B-COMPLEX WITH VITAMIN C) tablet Take 1 tablet by mouth daily.    . Cholecalciferol (VITAMIN D-3 PO) Take 5,000 Units by mouth daily with breakfast.     . finasteride (PROSCAR) 5 MG tablet Takes every third day    . folic acid (FOLVITE) 518 MCG tablet Take 400 mcg by mouth 2 (two) times daily.     . isosorbide mononitrate (IMDUR) 30 MG 24 hr tablet Take 3 tablets (90 mg total) by mouth daily. 270 tablet 3  . losartan (COZAAR) 50 MG tablet Take 1 tablet (50 mg total) by mouth daily. 90 tablet 1  . methylPREDNISolone (MEDROL) 4 MG tablet 5 tab po qd X 1d then 4 tab po qd  X 1d then 3 tab po qd X 1d then 2 tab po qd then 1 tab po qd 15 tablet 0  . metoprolol succinate (TOPROL-XL) 25 MG 24 hr tablet TAKE 1/2 TABLET(12.5 MG) BY MOUTH DAILY 45 tablet 3  . Misc Natural Products (OSTEO BI-FLEX ADV JOINT SHIELD) TABS Take 1 tablet by mouth 2 (two) times daily.     . nitroGLYCERIN (NITROSTAT) 0.4 MG SL tablet Place 1 tablet (0.4 mg total) under the tongue every 5 (five) minutes as needed. For chest pain. 25 tablet 6  . simvastatin (ZOCOR) 40 MG tablet Take 1 tablet (40 mg total) by mouth every evening. 90 tablet 1   No current facility-administered medications for this visit.      Past Medical History:  Diagnosis Date  . Abdominal aortic aneurysm ([place])    a. [place] (1/14):  3.3 x 3.4 cm => f/u 11/2013  . Amebic dysentery   . AMEBIC DYSENTERY 11/19/2007   Qualifier: History of  By: Lenna Gilford MD, Deborra Medina   . Anemia 03/07/2017  . Anxiety   . ANXIETY 11/19/2007   Qualifier: Diagnosis of  By: Lenna Gilford MD, Deborra Medina   . Arthritis 03/07/2017  . Atherosclerosis of coronary artery bypass graft with unstable angina pectoris ([place]) 04/19/2013  . BACK PAIN, LUMBAR 11/16/2007   Qualifier: Diagnosis of  By: Julien Girt CMA, Leigh    . Benign prostatic hypertrophy   . BENIGN PROSTATIC HYPERTROPHY, HX OF 11/16/2007  Qualifier: Diagnosis of  By: Julien Girt CMA, Leigh    . BRBPR (bright red blood per rectum) 11/01/2016  . CAD (coronary artery disease)    a. s/p CABG in 1979 and 1993;  b. LHC (5/14):  LM, LAD, CFX and RCA occluded; L-LAD ok, dLAD occluded after insertion of LIMA, S-OM occluded, S-PDA/AM 80-90 => PCI with Promus DES; EF 25%  . Cardiomyopathy, ischemic 06/05/2013  . Cerumen impaction    Bilateral  . CERUMEN IMPACTION, BILATERAL 02/03/2010   Qualifier: History of  By: Lenna Gilford MD, Deborra Medina   . Chicken pox as a child  . Chronic systolic CHF (congestive heart failure) ([place])   . COLONIC POLYPS 07/01/2008   Qualifier: Diagnosis of  By: Lenna Gilford MD, Deborra Medina   . Degenerative joint disease   .  DEGENERATIVE JOINT DISEASE 11/16/2007   Qualifier: Diagnosis of  By: Julien Girt CMA, Marliss Czar    . Diverticulosis of colon   . DIVERTICULOSIS OF COLON 07/01/2008   Qualifier: Diagnosis of  By: Lenna Gilford MD, Deborra Medina   . Double vision 03/07/2017  . Essential hypertension 11/16/2007   Qualifier: Diagnosis of  By: Julien Girt CMA, Marliss Czar    . Fingernail abnormalities 03/07/2017  . FLANK PAIN, RIGHT 02/03/2010   Qualifier: History of  By: Lenna Gilford MD, Deborra Medina   . GERD 11/16/2007   Qualifier: Diagnosis of  By: Julien Girt CMA, Marliss Czar    . GERD (gastroesophageal reflux disease)   . Gout   . GOUT 11/16/2007   Qualifier: Diagnosis of  By: Julien Girt CMA, Marliss Czar    . Hearing loss 11/24/2014  . Heart murmur   . History of shingles 10/27/2017  . Hypercholesterolemia   . HYPERCHOLESTEROLEMIA 11/16/2007   Qualifier: Diagnosis of  By: Julien Girt CMA, Marliss Czar    . Hypertension   . Ischemic cardiomyopathy    a. echo (09/05/13): EF 35%, diffuse HK worsened distal septal, mid/distal inferior and apical region, grade 1 diastolic dysfunction, mild LAE.    [name] Kidney stone 08/17/2011  . Loss of hearing   . Lumbar back pain   . Measles as a child  . Medicare annual wellness visit, subsequent 11/24/2014   Sees Dr Delman Cheadle for dermatology Sees Dr Roni Bread of Urology Sees Dr Stanford Breed of cardiology Sees Dr Virginia Rochester of Opthamology No further colonoscopies warranted       . Mumps as a child  . Nephrolithiasis   . Pain in joint, lower leg 07/29/2014  . PERIPHERAL VASCULAR DISEASE 11/16/2007   Qualifier: Diagnosis of  By: Julien Girt CMA, Marliss Czar    . Peripheral vascular disease ([place])   . Rectal bleeding 03/07/2017  . Shingles 07/29/2014  . Sun-damaged skin 05/31/2014    Past Surgical History:  Procedure Laterality Date  . CORONARY ANGIOPLASTY WITH STENT PLACEMENT  04/18/2013   RCA       . CORONARY ARTERY BYPASS GRAFT  1979   x4 SVG-DIAG-LAD, SVG-OM-PDA  . CORONARY ARTERY BYPASS GRAFT  1993   Redo x5 by Dr Harlow Asa; [facility], SVG-OM, SVG-AM-PL  . Decompressive  laminectomy  01/2006   L2 - scarum by Dr. Shellia Carwin  . HEMORRHOID SURGERY     fissure with hemorrhoid corrected at age 69  . INGUINAL HERNIA REPAIR  1994   Right by Dr Harlow Asa  . INGUINAL HERNIA REPAIR  1996   Left by Dr. Harlow Asa  . LEFT HEART CATHETERIZATION WITH CORONARY ANGIOGRAM N/A 09/23/2013   Procedure: LEFT HEART CATHETERIZATION WITH CORONARY ANGIOGRAM;  Surgeon: Blane Ohara, MD;  Location: [facility] CATH LAB;  Service: Cardiovascular;  Laterality: N/A;  . PERCUTANEOUS CORONARY STENT INTERVENTION (PCI-S) N/A 04/18/2013   Procedure: PERCUTANEOUS CORONARY STENT INTERVENTION (PCI-S);  Surgeon: Sherren Mocha, MD;  Location: [facility] CATH LAB;  Service: Cardiovascular;  Laterality: N/A;  . TONSILLECTOMY      Social History   Socioeconomic History  . Marital status: Married    Spouse name: [name] x 64 yrs  . Number of children: Not on file  . Years of education: Not on file  . Highest education level: Not on file  Occupational History  . 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 Financial resource strain: Not on file  . Food insecurity:    Worry: Not on file    Inability: Not on file  . Transportation needs:    Medical: Not on file    Non-medical: Not on file  Tobacco Use  . Smoking status: Former Smoker    Last attempt to quit: 11/28/1944    Years since quitting: 74.0  . Smokeless tobacco: Never Used  Substance and Sexual Activity  . Alcohol use: Yes    Alcohol/week: 2.0 standard drinks    Types: 2 Standard drinks or equivalent per week    Comment: daily rum  or wine  . Drug use: No  . Sexual activity: Not Currently    Comment: lives with wife, no dietary restrictions.   Lifestyle  . Physical activity:    Days per week: Not on file    Minutes per session: Not on file  . Stress: Not on file  Relationships  . Social connections:    Talks on phone: Not on file    Gets together: Not on file    Attends religious service: Not on file    Active  member of club or organization: Not on file    Attends meetings of clubs or organizations: Not on file    Relationship status: Not on file  . Intimate partner violence:    Fear of current or ex partner: Not on file    Emotionally abused: Not on file    Physically abused: Not on file    Forced sexual activity: Not on file  Other Topics Concern  . Not on file  Social History Narrative   Married   7 children    Family History  Problem Relation Age of Onset  . Parkinsonism Brother   . Diabetes Maternal Grandmother   . Depression Daughter   . Other Son        4 stents  . Heart disease Son   . Diabetes Son        type 2    ROS: no fevers or chills, productive cough, hemoptysis, dysphasia, odynophagia, melena, hematochezia, dysuria, hematuria, rash, seizure activity, orthopnea, PND, pedal edema, claudication. Remaining systems are negative.  Physical Exam: Well-developed well-nourished in no acute distress.  Skin is warm and dry.  HEENT is normal.  Neck is supple.  Chest is clear to auscultation with normal expansion.  Cardiovascular exam is regular rate and rhythm.  Abdominal exam nontender or distended. No masses palpated. Extremities show no edema. neuro grossly intact  ECG- personally reviewed  A/P  1  Kirk Ruths, MD

## 2018-12-17 DIAGNOSIS — M25551 Pain in right hip: Secondary | ICD-10-CM | POA: Insufficient documentation

## 2018-12-17 DIAGNOSIS — R609 Edema, unspecified: Secondary | ICD-10-CM | POA: Insufficient documentation

## 2018-12-17 NOTE — Assessment & Plan Note (Signed)
And right shoulder pain. Likely related to poor sleep and stress with his wife in Hospice. Encouraged moist heat and gentle stretching as tolerated. May try NSAIDs and prescription meds as directed and report if symptoms worsen or seek immediate care, given steroid pack and then consider referral if no improvement

## 2018-12-17 NOTE — Assessment & Plan Note (Signed)
Spent over 20 minutes in visit discussing his wife's decline and placement in Hospice. He is managing but very sad. No meds at this time. He will notify us if he needs medical help. He has good family support and his son is with him for support

## 2018-12-17 NOTE — Progress Notes (Signed)
Subjective:    Patient ID: Daniel Reeves, male    DOB: [birth year], 85 y.o.   MRN: [id number]  No chief complaint on file.   HPI Patient is in today for evaluation of hip and shoulder pain accompanied by his son.  He is continue to try and exercise while he has been simultaneously managing his wife's declining health and admission into hospice.  He thinks during all of his exercising he is pulled a muscle in his right shoulder and into his right hip.  He has trouble getting comfortable.  No radicular symptoms or incontinence.  He also notes worsening pedal edema on the left worse than the right.  Again no trauma or pain associated with that.  He is very sad but managing the stress of his wife's decline. Denies CP/palp/SOB/HA/congestion/fevers/GI or GU c/o. Taking meds as prescribed  Past Medical History:  Diagnosis Date  . Abdominal aortic aneurysm ([place])    a. [place] (1/14):  3.3 x 3.4 cm => f/u 11/2013  . Amebic dysentery   . AMEBIC DYSENTERY 11/19/2007   Qualifier: History of  By: Lenna Gilford MD, Deborra Medina   . Anemia 03/07/2017  . Anxiety   . ANXIETY 11/19/2007   Qualifier: Diagnosis of  By: Lenna Gilford MD, Deborra Medina   . Arthritis 03/07/2017  . 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 BACK PAIN, LUMBAR 11/16/2007   Qualifier: Diagnosis of  By: Julien Girt CMA, Leigh    . Benign prostatic hypertrophy   . BENIGN PROSTATIC HYPERTROPHY, HX OF 11/16/2007   Qualifier: Diagnosis of  By: Julien Girt CMA, Leigh    . BRBPR (bright red blood per rectum) 11/01/2016  . CAD (coronary artery disease)    a. s/p CABG in 1979 and 1993;  b. LHC (5/14):  LM, LAD, CFX and RCA occluded; L-LAD ok, dLAD occluded after insertion of LIMA, S-OM occluded, S-PDA/AM 80-90 => PCI with Promus DES; EF 25%  . Cardiomyopathy, ischemic 06/05/2013  . Cerumen impaction    Bilateral  . CERUMEN IMPACTION, BILATERAL 02/03/2010   Qualifier: History of  By: Lenna Gilford MD, Deborra Medina   . Chicken pox as a  child  . Chronic systolic CHF (congestive heart failure) ([place])   . COLONIC POLYPS 07/01/2008   Qualifier: Diagnosis of  By: Lenna Gilford MD, Deborra Medina   . Degenerative joint disease   . DEGENERATIVE JOINT DISEASE 11/16/2007   Qualifier: Diagnosis of  By: Julien Girt CMA, Marliss Czar    . Diverticulosis of colon   . DIVERTICULOSIS OF COLON 07/01/2008   Qualifier: Diagnosis of  By: Lenna Gilford MD, Deborra Medina   . Double vision 03/07/2017  . Essential hypertension 11/16/2007   Qualifier: Diagnosis of  By: Julien Girt CMA, Marliss Czar    . Fingernail abnormalities 03/07/2017  . FLANK PAIN, RIGHT 02/03/2010   Qualifier: History of  By: Lenna Gilford MD, Deborra Medina   . GERD 11/16/2007   Qualifier: Diagnosis of  By: Julien Girt CMA, Marliss Czar    . GERD (gastroesophageal reflux disease)   . Gout   . GOUT 11/16/2007   Qualifier: Diagnosis of  By: Julien Girt CMA, Marliss Czar    . Hearing loss 11/24/2014  . Heart murmur   . History of shingles 10/27/2017  . Hypercholesterolemia   . HYPERCHOLESTEROLEMIA 11/16/2007   Qualifier: Diagnosis of  By: Julien Girt CMA, Marliss Czar    . Hypertension   . Ischemic cardiomyopathy    a. echo (09/05/13): EF 35%, diffuse HK worsened distal septal, mid/distal inferior and apical  region, grade 1 diastolic dysfunction, mild LAE.    [name] Kidney stone 08/17/2011  . Loss of hearing   . Lumbar back pain   . Measles as a child  . Medicare annual wellness visit, subsequent 11/24/2014   Sees Dr Delman Cheadle for dermatology Sees Dr Roni Bread of Urology Sees Dr Stanford Breed of cardiology Sees Dr Virginia Rochester of Opthamology No further colonoscopies warranted       . Mumps as a child  . Nephrolithiasis   . Pain in joint, lower leg 07/29/2014  . PERIPHERAL VASCULAR DISEASE 11/16/2007   Qualifier: Diagnosis of  By: Julien Girt CMA, Marliss Czar    . Peripheral vascular disease ([place])   . Rectal bleeding 03/07/2017  . Shingles 07/29/2014  . Sun-damaged skin 05/31/2014    Past Surgical History:  Procedure Laterality Date  . CORONARY ANGIOPLASTY WITH STENT PLACEMENT  04/18/2013   RCA       .  CORONARY ARTERY BYPASS GRAFT  1979   x4 SVG-DIAG-LAD, SVG-OM-PDA  . CORONARY ARTERY BYPASS GRAFT  1993   Redo x5 by Dr Harlow Asa; [facility], SVG-OM, SVG-AM-PL  . Decompressive laminectomy  01/2006   L2 - scarum by Dr. Shellia Carwin  . HEMORRHOID SURGERY     fissure with hemorrhoid corrected at age 60  . INGUINAL HERNIA REPAIR  1994   Right by Dr Harlow Asa  . INGUINAL HERNIA REPAIR  1996   Left by Dr. Harlow Asa  . LEFT HEART CATHETERIZATION WITH CORONARY ANGIOGRAM N/A 09/23/2013   Procedure: LEFT HEART CATHETERIZATION WITH CORONARY ANGIOGRAM;  Surgeon: Blane Ohara, MD;  Location[facility] CATH LAB;  Service: Cardiovascular;  Laterality: N/A;  . PERCUTANEOUS CORONARY STENT INTERVENTION (PCI-S) N/A 04/18/2013   Procedure: PERCUTANEOUS CORONARY STENT INTERVENTION (PCI-S);  Surgeon: Sherren Mocha, MD;  Location: [facility] CATH LAB;  Service: Cardiovascular;  Laterality: N/A;  . TONSILLECTOMY      Family History  Problem Relation Age of Onset  . Parkinsonism Brother   . Diabetes Maternal Grandmother   . Depression Daughter   . Other Son        4 stents  . Heart disease Son   . Diabetes Son        type 2    Social History   Socioeconomic History  . Marital status: Married    Spouse name: [name] x 64 yrs  . Number of children: Not on file  . Years of education: Not on file  . Highest education level: Not on file  Occupational History  . 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 Financial resource strain: Not on file  . Food insecurity:    Worry: Not on file    Inability: Not on file  . Transportation needs:    Medical: Not on file    Non-medical: Not on file  Tobacco Use  . Smoking status: Former Smoker    Last attempt to quit: 11/28/1944    Years since quitting: 74.1  . Smokeless tobacco: Never Used  Substance and Sexual Activity  . Alcohol use: Yes    Alcohol/week: 2.0 standard drinks    Types: 2 Standard drinks or equivalent per week    Comment: daily  rum  or wine  . Drug use: No  . Sexual activity: Not Currently    Comment: lives with wife, no dietary restrictions.   Lifestyle  . Physical activity:    Days per week: Not on file    Minutes per session: Not on file  . Stress:  Not on file  Relationships  . Social connections:    Talks on phone: Not on file    Gets together: Not on file    Attends religious service: Not on file    Active member of club or organization: Not on file    Attends meetings of clubs or organizations: Not on file    Relationship status: Not on file  . Intimate partner violence:    Fear of current or ex partner: Not on file    Emotionally abused: Not on file    Physically abused: Not on file    Forced sexual activity: Not on file  Other Topics Concern  . Not on file  Social History Narrative   Married   7 children    Outpatient Medications Prior to Visit  Medication Sig Dispense Refill  . allopurinol (ZYLOPRIM) 300 MG tablet TAKE 1 TABLET(300 MG) BY MOUTH DAILY 90 tablet 0  . aspirin EC 81 MG tablet Take 81 mg by mouth every morning.     . B Complex-C (B-COMPLEX WITH VITAMIN C) tablet Take 1 tablet by mouth daily.    . Cholecalciferol (VITAMIN D-3 PO) Take 5,000 Units by mouth daily with breakfast.     . finasteride (PROSCAR) 5 MG tablet Takes every third day    . folic acid (FOLVITE) 440 MCG tablet Take 400 mcg by mouth 2 (two) times daily.     [name] losartan (COZAAR) 50 MG tablet Take 1 tablet (50 mg total) by mouth daily. 90 tablet 1  . metoprolol succinate (TOPROL-XL) 25 MG 24 hr tablet TAKE 1/2 TABLET(12.5 MG) BY MOUTH DAILY 45 tablet 3  . Misc Natural Products (OSTEO BI-FLEX ADV JOINT SHIELD) TABS Take 1 tablet by mouth 2 (two) times daily.     . nitroGLYCERIN (NITROSTAT) 0.4 MG SL tablet Place 1 tablet (0.4 mg total) under the tongue every 5 (five) minutes as needed. For chest pain. 25 tablet 6  . isosorbide mononitrate (IMDUR) 30 MG 24 hr tablet Take 3 tablets (90 mg total) by mouth daily. 270  tablet 3  . simvastatin (ZOCOR) 40 MG tablet TAKE 1 TABLET BY MOUTH EVERY EVENING 90 tablet 0   No facility-administered medications prior to visit.     Allergies  Allergen Reactions  . Lisinopril     REACTION: dizziness  . Methocarbamol     REACTION: pt states "dizzy"  . Pregabalin     REACTION: pt states "dizzy"  . Ramipril     REACTION: hives and dizziness    Review of Systems  Constitutional: Positive for malaise/fatigue. Negative for fever.  HENT: Negative for congestion.   Eyes: Negative for blurred vision.  Respiratory: Negative for shortness of breath.   Cardiovascular: Positive for leg swelling. Negative for chest pain and palpitations.  Gastrointestinal: Negative for abdominal pain, blood in stool and nausea.  Genitourinary: Negative for dysuria and frequency.  Musculoskeletal: Positive for joint pain. Negative for falls.  Skin: Negative for rash.  Neurological: Negative for dizziness, loss of consciousness and headaches.  Endo/Heme/Allergies: Negative for environmental allergies.  Psychiatric/Behavioral: Positive for depression. Negative for hallucinations, substance abuse and suicidal ideas. The patient is nervous/anxious.        Objective:    Physical Exam Vitals signs and nursing note reviewed.  Constitutional:      General: He is not in acute distress.    Appearance: He is well-developed.  HENT:     Head: Normocephalic and atraumatic.     Nose: Nose normal.  Eyes:     General:        Right eye: No discharge.        Left eye: No discharge.  Neck:     Musculoskeletal: Normal range of motion and neck supple.  Cardiovascular:     Rate and Rhythm: Normal rate and regular rhythm.     Heart sounds: Murmur present.  Pulmonary:     Effort: Pulmonary effort is normal.     Breath sounds: Normal breath sounds.  Abdominal:     General: Bowel sounds are normal.     Palpations: Abdomen is soft.     Tenderness: There is no abdominal tenderness.    Musculoskeletal:     Right lower leg: Edema present.     Left lower leg: Edema present.  Skin:    General: Skin is warm and dry.  Neurological:     Mental Status: He is alert and oriented to person, place, and time.     BP (!) 126/59 (BP Location: Left Arm, Patient Position: Sitting, Cuff Size: Normal)   Pulse (!) 55   Temp 98.4 F (36.9 C) (Oral)   Resp 18   Wt 164 lb 3.2 oz (74.5 kg)   SpO2 99%   BMI 21.96 kg/m  Wt Readings from Last 3 Encounters:  12/11/18 164 lb 3.2 oz (74.5 kg)  10/17/18 160 lb (72.6 kg)  04/18/18 171 lb 12.8 oz (77.9 kg)     Lab Results  Component Value Date   WBC 6.5 02/07/2018   HGB 13.8 02/07/2018   HCT 40.2 02/07/2018   PLT 185.0 02/07/2018   GLUCOSE 116 (H) 02/04/2018   CHOL 85 10/27/2017   TRIG 102 10/27/2017   HDL 32 (L) 10/27/2017   LDLCALC 34 10/27/2017   ALT 19 02/04/2018   AST 27 02/04/2018   NA 139 02/04/2018   K 3.8 02/04/2018   CL 107 02/04/2018   CREATININE 0.99 02/04/2018   BUN 15 02/04/2018   CO2 25 02/04/2018   TSH 1.70 10/27/2017   PSA 4.29 (H) 02/08/2011   INR 1.09 02/04/2018    Lab Results  Component Value Date   TSH 1.70 10/27/2017   Lab Results  Component Value Date   WBC 6.5 02/07/2018   HGB 13.8 02/07/2018   HCT 40.2 02/07/2018   MCV 96.2 02/07/2018   PLT 185.0 02/07/2018   Lab Results  Component Value Date   NA 139 02/04/2018   K 3.8 02/04/2018   CO2 25 02/04/2018   GLUCOSE 116 (H) 02/04/2018   BUN 15 02/04/2018   CREATININE 0.99 02/04/2018   BILITOT 1.1 02/04/2018   ALKPHOS 66 02/04/2018   AST 27 02/04/2018   ALT 19 02/04/2018   PROT 6.5 02/04/2018   ALBUMIN 3.7 02/04/2018   CALCIUM 8.9 02/04/2018   ANIONGAP 7 02/04/2018   GFR 69.62 03/07/2017   Lab Results  Component Value Date   CHOL 85 10/27/2017   Lab Results  Component Value Date   HDL 32 (L) 10/27/2017   Lab Results  Component Value Date   LDLCALC 34 10/27/2017   Lab Results  Component Value Date   TRIG 102  10/27/2017   Lab Results  Component Value Date   CHOLHDL 2.7 10/27/2017   No results found for: HGBA1C     Assessment & Plan:   Problem List Items Addressed This Visit    HYPERCHOLESTEROLEMIA   Relevant Medications   simvastatin (ZOCOR) 40 MG tablet   isosorbide mononitrate (IMDUR) 30 MG  24 hr tablet   Anxiety state    Spent over 20 minutes in visit discussing his wife's decline and placement in Hospice. He is managing but very sad. No meds at this time. He will notify us if he needs medical help. He has good family support and his son is with him for support      Essential hypertension    Well controlled, no changes to meds. Encouraged heart healthy diet such as the DASH diet and exercise as tolerated.       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 Likely related to poor sleep and stress with his wife in Hospice. Encouraged moist heat and gentle stretching as tolerated. May try NSAIDs and prescription meds as directed and report if symptoms worsen or seek immediate care, given steroid pack and then consider referral if no improvement      Relevant Orders   DG HIP UNILAT W OR W/O PELVIS 2-3 VIEWS RIGHT (Completed)      I have changed Jguadalupe F. Leger "Dick"'s simvastatin. I am also having him start on methylPREDNISolone. Additionally, I am having him maintain his folic acid, OSTEO BI-FLEX ADV JOINT SHIELD, Cholecalciferol (VITAMIN D-3 PO), aspirin EC, finasteride, B-complex with vitamin C, allopurinol, losartan, metoprolol succinate, nitroGLYCERIN, and  isosorbide mononitrate.  Meds ordered this encounter  Medications  . simvastatin (ZOCOR) 40 MG tablet    Sig: Take 1 tablet (40 mg total) by mouth every evening.    Dispense:  90 tablet    Refill:  1  . isosorbide mononitrate (IMDUR) 30 MG 24 hr tablet    Sig: Take 3 tablets (90 mg total) by mouth daily.    Dispense:  270 tablet    Refill:  3  . methylPREDNISolone (MEDROL) 4 MG tablet    Sig: 5 tab po qd X 1d then 4 tab po qd X 1d then 3 tab po qd X 1d then 2 tab po qd then 1 tab po qd    Dispense:  15 tablet    Refill:  0     Penni Homans, MD

## 2018-12-17 NOTE — Assessment & Plan Note (Signed)
Well controlled, no changes to meds. Encouraged heart healthy diet such as the DASH diet and exercise as tolerated.  °

## 2018-12-17 NOTE — Assessment & Plan Note (Signed)
Elevate feet above heart, minimize sodium, stay active, compression hose and Lasix prn

## 2018-12-19 ENCOUNTER — Ambulatory Visit: Payer: Medicare Other | Admitting: Cardiology

## 2018-12-24 ENCOUNTER — Telehealth: Payer: Self-pay | Admitting: Cardiology

## 2018-12-24 NOTE — Telephone Encounter (Signed)
°  Patient needs a phone call regarding his Clopidegril 75mg  tablets. He is not taking and wanting to know if he should restart? His last refill was 2018

## 2018-12-24 NOTE — Telephone Encounter (Signed)
Called patient, advised that medication was stopped in 2018 by Dr.Crenshaw. Advised patient to keep that plan until seeing Dr.Crenshaw for an upcoming appointment.

## 2019-01-04 NOTE — Progress Notes (Signed)
HPI: FU CAD; s/p CABG in 1979 and 1993, ischemic CM, systolic CHF, AAA, HTN, HL. Patient underwent cardiac catheterization in May of 2014. The left main, LAD, circumflex and RCA were occluded. The LIMA to the LAD was patent and the distal LAD was occluded after the insertion. Saphenous vein graft to the obtuse marginal was occluded. Saphenous vein graft to the acute marginal and PDA had a high-grade lesion prior to insertion into the PDA of 80-90%. Ejection fraction was 25%. PCI: Promus Premier (3.5x12 mm) DES to the [facility].Repeat catheterization in October 2014 because of recurrent chest pain. The stent placed in the saphenous vein graft to the PDA had mild in-stent restenosis. Medical therapy recommended. Nuclear study 2/17 showed EF 35, inferolateral scar, no ischemia.Last echocardiogram February 2018 showed ejection fraction 09-98%, grade 1 diastolic dysfunction, mild to moderate aortic insufficiency, mild mitral regurgitation. Carotid Dopplers February 2018 showed less than 50% bilateral stenosis. Abdominal ultrasound June 2019 showed 3.9 cm abdominal aortic aneurysm.  Since he was last seen,patient denies dyspnea, chest pain, palpitations or syncope.  Appropriately emotional about the death of his wife recently.  Current Outpatient Medications  Medication Sig Dispense Refill  . allopurinol (ZYLOPRIM) 300 MG tablet TAKE 1 TABLET(300 MG) BY MOUTH DAILY 90 tablet 0  . aspirin EC 81 MG tablet Take 81 mg by mouth every morning.     . B Complex-C (B-COMPLEX WITH VITAMIN C) tablet Take 1 tablet by mouth daily.    . Cholecalciferol (VITAMIN D-3 PO) Take 5,000 Units by mouth daily with breakfast.     . finasteride (PROSCAR) 5 MG tablet Takes every third day    . folic acid (FOLVITE) 338 MCG tablet Take 400 mcg by mouth 2 (two) times daily.     . isosorbide mononitrate (IMDUR) 30 MG 24 hr tablet Take 3 tablets (90 mg total) by mouth daily. 270 tablet 3  . losartan (COZAAR) 50 MG tablet Take 1  tablet (50 mg total) by mouth daily. 90 tablet 1  . metoprolol succinate (TOPROL-XL) 25 MG 24 hr tablet TAKE 1/2 TABLET(12.5 MG) BY MOUTH DAILY 45 tablet 3  . Misc Natural Products (OSTEO BI-FLEX ADV JOINT SHIELD) TABS Take 1 tablet by mouth 2 (two) times daily.     . nitroGLYCERIN (NITROSTAT) 0.4 MG SL tablet Place 1 tablet (0.4 mg total) under the tongue every 5 (five) minutes as needed. For chest pain. 25 tablet 6  . simvastatin (ZOCOR) 40 MG tablet Take 1 tablet (40 mg total) by mouth every evening. 90 tablet 1   No current facility-administered medications for this visit.      Past Medical History:  Diagnosis Date  . Abdominal aortic aneurysm ([place])    a. [place] (1/14):  3.3 x 3.4 cm => f/u 11/2013  . Amebic dysentery   . AMEBIC DYSENTERY 11/19/2007   Qualifier: History of  By: Lenna Gilford MD, Deborra Medina   . Anemia 03/07/2017  . Anxiety   . ANXIETY 11/19/2007   Qualifier: Diagnosis of  By: Lenna Gilford MD, Deborra Medina   . Arthritis 03/07/2017  . Atherosclerosis of coronary artery bypass graft with unstable angina pectoris ([place]) 04/19/2013  . BACK PAIN, LUMBAR 11/16/2007   Qualifier: Diagnosis of  By: Julien Girt CMA, Leigh    . Benign prostatic hypertrophy   . BENIGN PROSTATIC HYPERTROPHY, HX OF 11/16/2007   Qualifier: Diagnosis of  By: Julien Girt CMA, Leigh    . BRBPR (bright red blood per rectum) 11/01/2016  . CAD (coronary  artery disease)    a. s/p CABG in 1979 and 1993;  b. [facility] (5/14):  LM, LAD, CFX and RCA occluded; L-LAD ok, dLAD occluded after insertion of LIMA, S-OM occluded, S-PDA/AM 80-90 => PCI with Promus DES; EF 25%  . Cardiomyopathy, ischemic 06/05/2013  . Cerumen impaction    Bilateral  . CERUMEN IMPACTION, BILATERAL 02/03/2010   Qualifier: History of  By: Lenna Gilford MD, Deborra Medina   . Chicken pox as a child  . Chronic systolic CHF (congestive heart failure) ([place])   . COLONIC POLYPS 07/01/2008   Qualifier: Diagnosis of  By: Lenna Gilford MD, Deborra Medina   . Degenerative joint disease   . DEGENERATIVE JOINT DISEASE  11/16/2007   Qualifier: Diagnosis of  By: Julien Girt CMA, Marliss Czar    . Diverticulosis of colon   . DIVERTICULOSIS OF COLON 07/01/2008   Qualifier: Diagnosis of  By: Lenna Gilford MD, Deborra Medina   . Double vision 03/07/2017  . Essential hypertension 11/16/2007   Qualifier: Diagnosis of  By: Julien Girt CMA, Marliss Czar    . Fingernail abnormalities 03/07/2017  . FLANK PAIN, RIGHT 02/03/2010   Qualifier: History of  By: Lenna Gilford MD, Deborra Medina   . GERD 11/16/2007   Qualifier: Diagnosis of  By: Julien Girt CMA, Marliss Czar    . GERD (gastroesophageal reflux disease)   . Gout   . GOUT 11/16/2007   Qualifier: Diagnosis of  By: Julien Girt CMA, Marliss Czar    . Hearing loss 11/24/2014  . Heart murmur   . History of shingles 10/27/2017  . Hypercholesterolemia   . HYPERCHOLESTEROLEMIA 11/16/2007   Qualifier: Diagnosis of  By: Julien Girt CMA, Marliss Czar    . Hypertension   . Ischemic cardiomyopathy    a. echo (09/05/13): EF 35%, diffuse HK worsened distal septal, mid/distal inferior and apical region, grade 1 diastolic dysfunction, mild LAE.    [name] Kidney stone 08/17/2011  . Loss of hearing   . Lumbar back pain   . Measles as a child  . Medicare annual wellness visit, subsequent 11/24/2014   Sees Dr Delman Cheadle for dermatology Sees Dr Roni Bread of Urology Sees Dr Stanford Breed of cardiology Sees Dr Virginia Rochester of Opthamology No further colonoscopies warranted       . Mumps as a child  . Nephrolithiasis   . Pain in joint, lower leg 07/29/2014  . PERIPHERAL VASCULAR DISEASE 11/16/2007   Qualifier: Diagnosis of  By: Julien Girt CMA, Marliss Czar    . Peripheral vascular disease ([place])   . Rectal bleeding 03/07/2017  . Shingles 07/29/2014  . Sun-damaged skin 05/31/2014    Past Surgical History:  Procedure Laterality Date  . CORONARY ANGIOPLASTY WITH STENT PLACEMENT  04/18/2013   RCA       . CORONARY ARTERY BYPASS GRAFT  1979   x4 SVG-DIAG-LAD, SVG-OM-PDA  . CORONARY ARTERY BYPASS GRAFT  1993   Redo x5 by Dr Harlow Asa; [facility], SVG-OM, SVG-AM-PL  . Decompressive laminectomy  01/2006   L2 -  scarum by Dr. Shellia Carwin  . HEMORRHOID SURGERY     fissure with hemorrhoid corrected at age 43  . INGUINAL HERNIA REPAIR  1994   Right by Dr Harlow Asa  . INGUINAL HERNIA REPAIR  1996   Left by Dr. Harlow Asa  . LEFT HEART CATHETERIZATION WITH CORONARY ANGIOGRAM N/A 09/23/2013   Procedure: LEFT HEART CATHETERIZATION WITH CORONARY ANGIOGRAM;  Surgeon: Blane Ohara, MD;  Location[facility] LAB;  Service: Cardiovascular;  Laterality: N/A;  . PERCUTANEOUS CORONARY STENT INTERVENTION (PCI-S) N/A 04/18/2013   Procedure: PERCUTANEOUS CORONARY STENT INTERVENTION (PCI-S);  Surgeon: Sherren Mocha, MD;  Location[facility] CATH LAB;  Service: Cardiovascular;  Laterality: N/A;  . TONSILLECTOMY      Social History   Socioeconomic History  . Marital status: Widowed    Spouse name: [name] x 37 yrs  . Number of children: Not on file  . Years of education: Not on file  . Highest education level: Not on file  Occupational History  . 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 Financial resource strain: Not on file  . Food insecurity:    Worry: Not on file    Inability: Not on file  . Transportation needs:    Medical: Not on file    Non-medical: Not on file  Tobacco Use  . Smoking status: Former Smoker    Last attempt to quit: 11/28/1944    Years since quitting: 74.1  . Smokeless tobacco: Never Used  Substance and Sexual Activity  . Alcohol use: Yes    Alcohol/week: 2.0 standard drinks    Types: 2 Standard drinks or equivalent per week    Comment: daily rum  or wine  . Drug use: No  . Sexual activity: Not Currently    Comment: lives with wife, no dietary restrictions.   Lifestyle  . Physical activity:    Days per week: Not on file    Minutes per session: Not on file  . Stress: Not on file  Relationships  . Social connections:    Talks on phone: Not on file    Gets together: Not on file    Attends religious service: Not on file    Active member of club or  organization: Not on file    Attends meetings of clubs or organizations: Not on file    Relationship status: Not on file  . Intimate partner violence:    Fear of current or ex partner: Not on file    Emotionally abused: Not on file    Physically abused: Not on file    Forced sexual activity: Not on file  Other Topics Concern  . Not on file  Social History Narrative   Married   7 children    Family History  Problem Relation Age of Onset  . Parkinsonism Brother   . Diabetes Maternal Grandmother   . Depression Daughter   . Other Son        4 stents  . Heart disease Son   . Diabetes Son        type 2    ROS: no fevers or chills, productive cough, hemoptysis, dysphasia, odynophagia, melena, hematochezia, dysuria, hematuria, rash, seizure activity, orthopnea, PND, pedal edema, claudication. Remaining systems are negative.  Physical Exam: Well-developed well-nourished in no acute distress.  Skin is warm and dry.  HEENT is normal.  Neck is supple.  Chest is clear to auscultation with normal expansion.  Cardiovascular exam is regular rate and rhythm.  Abdominal exam nontender or distended. No masses palpated. Extremities show trace edema. neuro grossly intact  ECG-sinus rhythm with left bundle branch block.  Personally reviewed  A/P  1 coronary artery disease-patient denies chest pain.  Continue medical therapy with aspirin and statin.  2 history of abdominal aortic aneurysm-patient will need follow-up abdominal ultrasound June 2020.  3 ischemic cardiomyopathy-continue medical therapy with beta-blockade and ARB.  No evidence of congestive heart failure.  4 hypertension-blood pressure is controlled.  Continue present medications and follow.  Check potassium and renal function.  5 hyperlipidemia-continue statin.  Check lipids and liver.  Kirk Ruths, MD

## 2019-01-10 ENCOUNTER — Ambulatory Visit (INDEPENDENT_AMBULATORY_CARE_PROVIDER_SITE_OTHER): Payer: Medicare Other | Admitting: Cardiology

## 2019-01-10 ENCOUNTER — Encounter: Payer: Self-pay | Admitting: Cardiology

## 2019-01-10 VITALS — BP 118/56 | HR 62 | Ht 72.5 in | Wt 159.0 lb

## 2019-01-10 DIAGNOSIS — I2510 Atherosclerotic heart disease of native coronary artery without angina pectoris: Secondary | ICD-10-CM | POA: Diagnosis not present

## 2019-01-10 DIAGNOSIS — I10 Essential (primary) hypertension: Secondary | ICD-10-CM

## 2019-01-10 DIAGNOSIS — I255 Ischemic cardiomyopathy: Secondary | ICD-10-CM | POA: Diagnosis not present

## 2019-01-10 DIAGNOSIS — E7800 Pure hypercholesterolemia, unspecified: Secondary | ICD-10-CM | POA: Diagnosis not present

## 2019-01-10 LAB — COMPREHENSIVE METABOLIC PANEL
A/G RATIO: 1.9 (ref 1.2–2.2)
ALT: 14 IU/L (ref 0–44)
AST: 25 IU/L (ref 0–40)
Albumin: 4.1 g/dL (ref 3.5–4.6)
Alkaline Phosphatase: 81 IU/L (ref 39–117)
BUN/Creatinine Ratio: 16 (ref 10–24)
BUN: 20 mg/dL (ref 10–36)
Bilirubin Total: 0.7 mg/dL (ref 0.0–1.2)
CALCIUM: 9.2 mg/dL (ref 8.6–10.2)
CO2: 21 mmol/L (ref 20–29)
Chloride: 102 mmol/L (ref 96–106)
Creatinine, Ser: 1.26 mg/dL (ref 0.76–1.27)
GFR calc Af Amer: 57 mL/min/{1.73_m2} — ABNORMAL LOW (ref >59)
GFR calc non Af Amer: 49 mL/min/{1.73_m2} — ABNORMAL LOW (ref >59)
Globulin, Total: 2.2 g/dL (ref 1.5–4.5)
Glucose: 106 mg/dL — ABNORMAL HIGH (ref 65–99)
Potassium: 4 mmol/L (ref 3.5–5.2)
Sodium: 140 mmol/L (ref 134–144)
Total Protein: 6.3 g/dL (ref 6.0–8.5)

## 2019-01-10 LAB — LIPID PANEL
Chol/HDL Ratio: 2.4 ratio (ref 0.0–5.0)
Cholesterol, Total: 107 mg/dL (ref 100–199)
HDL: 44 mg/dL (ref >39)
LDL Calculated: 40 mg/dL (ref 0–99)
Triglycerides: 114 mg/dL (ref 0–149)
VLDL Cholesterol Cal: 23 mg/dL (ref 5–40)

## 2019-01-10 NOTE — Patient Instructions (Signed)
Medication Instructions:  NO CHANGE If you need a refill on your cardiac medications before your next appointment, please call your pharmacy.   Lab work: Your physician recommends that you HAVE LAB WORK TODAY If you have labs (blood work) drawn today and your tests are completely normal, you will receive your results only by: [name] MyChart Message (if you have MyChart) OR . A paper copy in the mail If you have any lab test that is abnormal or we need to change your treatment, we will call you to review the results.  Follow-Up: At [facility], you and your health needs are our priority.  As part of our continuing mission to provide you with exceptional heart care, we have created designated Provider Care Teams.  These Care Teams include your primary Cardiologist (physician) and Advanced Practice Providers (APPs -  Physician Assistants and Nurse Practitioners) who all work together to provide you with the care you need, when you need it. You will need a follow up appointment in 6 months.  Please call our office 2 months in advance to schedule this appointment.   CALL IN June TO SCHEDULE APPOINTMENT IN AUGUST IN [place]

## 2019-01-15 ENCOUNTER — Ambulatory Visit (INDEPENDENT_AMBULATORY_CARE_PROVIDER_SITE_OTHER): Payer: Medicare Other | Admitting: Family Medicine

## 2019-01-15 VITALS — BP 122/58 | HR 55 | Temp 98.0°F | Resp 18 | Ht 73.0 in | Wt 160.0 lb

## 2019-01-15 DIAGNOSIS — F411 Generalized anxiety disorder: Secondary | ICD-10-CM | POA: Diagnosis not present

## 2019-01-15 DIAGNOSIS — I255 Ischemic cardiomyopathy: Secondary | ICD-10-CM

## 2019-01-15 DIAGNOSIS — I10 Essential (primary) hypertension: Secondary | ICD-10-CM

## 2019-01-15 DIAGNOSIS — M25551 Pain in right hip: Secondary | ICD-10-CM

## 2019-01-15 DIAGNOSIS — K219 Gastro-esophageal reflux disease without esophagitis: Secondary | ICD-10-CM | POA: Diagnosis not present

## 2019-01-15 DIAGNOSIS — H9192 Unspecified hearing loss, left ear: Secondary | ICD-10-CM

## 2019-01-15 DIAGNOSIS — H9391 Unspecified disorder of right ear: Secondary | ICD-10-CM | POA: Diagnosis not present

## 2019-01-15 NOTE — Patient Instructions (Addendum)
Lidocaine patch for the hip and if it gets worse and you want to see orthopaedics because the pain gets worse.  Try the zip up or the velcro compression hose you can get them at a medical supply store ([place]) or order online   Knee high and light weight compression (10-20 mmhg)  3-5 drops of hydrogen peroxide in left ear nihtly for 5 nights then return for ear flush, apply cotton ball  Hip Pain  The hip is the joint between the upper legs and the lower pelvis. The bones, cartilage, tendons, and muscles of your hip joint support your body and allow you to move around. Hip pain can range from a minor ache to severe pain in one or both of your hips. The pain may be felt on the inside of the hip joint near the groin, or the outside near the buttocks and upper thigh. You may also have swelling or stiffness. Follow these instructions at home: Managing pain, stiffness, and swelling  If directed, apply ice to the injured area. ? Put ice in a plastic bag. ? Place a towel between your skin and the bag. ? Leave the ice on for 20 minutes, 2-3 times a day  Sleep with a pillow between your legs on your most comfortable side.  Avoid any activities that cause pain. General instructions  Take over-the-counter and prescription medicines only as told by your health care provider.  Do any exercises as told by your health care provider.  Record the following: ? How often you have hip pain. ? The location of your pain. ? What the pain feels like. ? What makes the pain worse.  Keep all follow-up visits as told by your health care provider. This is important. Contact a health care provider if:  You cannot put weight on your leg.  Your pain or swelling continues or gets worse after one week.  It gets harder to walk.  You have a fever. Get help right away if:  You fall.  You have a sudden increase in pain and swelling in your hip.  Your hip is red or swollen or very tender to  touch. Summary  Hip pain can range from a minor ache to severe pain in one or both of your hips.  The pain may be felt on the inside of the hip joint near the groin, or the outside near the buttocks and upper thigh.  Avoid any activities that cause pain.  Record how often you have hip pain, the location of the pain, what makes it worse and what it feels like. This information is not intended to replace advice given to you by your health care provider. Make sure you discuss any questions you have with your health care provider. Document Released: 05/04/2010 Document Revised: 10/17/2016 Document Reviewed: 10/17/2016 Elsevier Interactive Patient Education  2019 [employer].

## 2019-01-15 NOTE — Assessment & Plan Note (Addendum)
Long term pain and uses Lidocaine topical and that helps some.he will try patches and let us know if the pain gets bad enough to accept referral to ortho or sports med for further intervention.

## 2019-01-16 NOTE — Assessment & Plan Note (Signed)
Well controlled, no changes to meds. Encouraged heart healthy diet such as the DASH diet and exercise as tolerated.  °

## 2019-01-16 NOTE — Assessment & Plan Note (Signed)
Avoid offending foods, no recent flares

## 2019-01-16 NOTE — Assessment & Plan Note (Signed)
Struggling with a grief reaction after his wife passed away recently. He has a good support system with his kids, friends and neighbors so he feels he is managing well. He will let us know if he needs any further support

## 2019-01-16 NOTE — Assessment & Plan Note (Signed)
left sided cerumen impaction noted he will apply 5 drops of peroxide to his ear qhs x 5 days and return for flushing if on irmpovement in hearing then can have evaluation done at [facility] or [facility]

## 2019-01-16 NOTE — Progress Notes (Signed)
Subjective:    Patient ID: Daniel Reeves, male    DOB: 02/18/1924, 85 y.o.   MRN: [id number]  No chief complaint on file.   HPI Patient is in today for follow-up.  He is managing fairly well all things considered.  His wife of many decades has passed away recently while he is sad he does note he has good support.  His friends family and neighbors involved in bringing him food and keeping him busy.  He feels he will stay in his home for the time being and work on projects there.  He denies any recent febrile illness or hospitalizations.  His biggest complaint today is right hip pain which does affect his activity.  He has been using lidocaine gel with some relief intermittently the other day he did some applications of Roundup in his yard and his hip is been more sore since then. Denies CP/palp/SOB/HA/congestion/fevers/GI or GU c/o. Taking meds as prescribed  Past Medical History:  Diagnosis Date  . Abdominal aortic aneurysm ([place])    a. [place] (1/14):  3.3 x 3.4 cm => f/u 11/2013  . Amebic dysentery   . AMEBIC DYSENTERY 11/19/2007   Qualifier: History of  By: Lenna Gilford MD, Deborra Medina   . Anemia 03/07/2017  . Anxiety   . ANXIETY 11/19/2007   Qualifier: Diagnosis of  By: Lenna Gilford MD, Deborra Medina   . Arthritis 03/07/2017  . 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 BACK PAIN, LUMBAR 11/16/2007   Qualifier: Diagnosis of  By: Julien Girt CMA, Leigh    . Benign prostatic hypertrophy   . BENIGN PROSTATIC HYPERTROPHY, HX OF 11/16/2007   Qualifier: Diagnosis of  By: Julien Girt CMA, Leigh    . BRBPR (bright red blood per rectum) 11/01/2016  . CAD (coronary artery disease)    a. s/p CABG in 1979 and 1993;  b. LHC (5/14):  LM, LAD, CFX and RCA occluded; L-LAD ok, dLAD occluded after insertion of LIMA, S-OM occluded, S-PDA/AM 80-90 => PCI with Promus DES; EF 25%  . Cardiomyopathy, ischemic 06/05/2013  . Cerumen impaction    Bilateral  . CERUMEN IMPACTION, BILATERAL  02/03/2010   Qualifier: History of  By: Lenna Gilford MD, Deborra Medina   . Chicken pox as a child  . Chronic systolic CHF (congestive heart failure) ([place])   . COLONIC POLYPS 07/01/2008   Qualifier: Diagnosis of  By: Lenna Gilford MD, Deborra Medina   . Degenerative joint disease   . DEGENERATIVE JOINT DISEASE 11/16/2007   Qualifier: Diagnosis of  By: Julien Girt CMA, Marliss Czar    . Diverticulosis of colon   . DIVERTICULOSIS OF COLON 07/01/2008   Qualifier: Diagnosis of  By: Lenna Gilford MD, Deborra Medina   . Double vision 03/07/2017  . Essential hypertension 11/16/2007   Qualifier: Diagnosis of  By: Julien Girt CMA, Marliss Czar    . Fingernail abnormalities 03/07/2017  . FLANK PAIN, RIGHT 02/03/2010   Qualifier: History of  By: Lenna Gilford MD, Deborra Medina   . GERD 11/16/2007   Qualifier: Diagnosis of  By: Julien Girt CMA, Marliss Czar    . GERD (gastroesophageal reflux disease)   . Gout   . GOUT 11/16/2007   Qualifier: Diagnosis of  By: Julien Girt CMA, Marliss Czar    . Hearing loss 11/24/2014  . Heart murmur   . History of shingles 10/27/2017  . Hypercholesterolemia   . HYPERCHOLESTEROLEMIA 11/16/2007   Qualifier: Diagnosis of  By: Julien Girt CMA, Marliss Czar    . Hypertension   . Ischemic cardiomyopathy  a. echo (09/05/13): EF 35%, diffuse HK worsened distal septal, mid/distal inferior and apical region, grade 1 diastolic dysfunction, mild LAE.    [name] Kidney stone 08/17/2011  . Loss of hearing   . Lumbar back pain   . Measles as a child  . Medicare annual wellness visit, subsequent 11/24/2014   Sees Dr Delman Cheadle for dermatology Sees Dr Roni Bread of Urology Sees Dr Stanford Breed of cardiology Sees Dr Virginia Rochester of Opthamology No further colonoscopies warranted       . Mumps as a child  . Nephrolithiasis   . Pain in joint, lower leg 07/29/2014  . PERIPHERAL VASCULAR DISEASE 11/16/2007   Qualifier: Diagnosis of  By: Julien Girt CMA, Marliss Czar    . Peripheral vascular disease ([place])   . Rectal bleeding 03/07/2017  . Shingles 07/29/2014  . Sun-damaged skin 05/31/2014    Past Surgical History:  Procedure Laterality  Date  . CORONARY ANGIOPLASTY WITH STENT PLACEMENT  04/18/2013   RCA       . CORONARY ARTERY BYPASS GRAFT  1979   x4 SVG-DIAG-LAD, SVG-OM-PDA  . CORONARY ARTERY BYPASS GRAFT  1993   Redo x5 by Dr Harlow Asa; [facility], SVG-OM, SVG-AM-PL  . Decompressive laminectomy  01/2006   L2 - scarum by Dr. Shellia Carwin  . HEMORRHOID SURGERY     fissure with hemorrhoid corrected at age 82  . INGUINAL HERNIA REPAIR  1994   Right by Dr Harlow Asa  . INGUINAL HERNIA REPAIR  1996   Left by Dr. Harlow Asa  . LEFT HEART CATHETERIZATION WITH CORONARY ANGIOGRAM N/A 09/23/2013   Procedure: LEFT HEART CATHETERIZATION WITH CORONARY ANGIOGRAM;  Surgeon: Blane Ohara, MD;  Location: [facility] CATH LAB;  Service: Cardiovascular;  Laterality: N/A;  . PERCUTANEOUS CORONARY STENT INTERVENTION (PCI-S) N/A 04/18/2013   Procedure: PERCUTANEOUS CORONARY STENT INTERVENTION (PCI-S);  Surgeon: Sherren Mocha, MD;  Location: [facility] CATH LAB;  Service: Cardiovascular;  Laterality: N/A;  . TONSILLECTOMY      Family History  Problem Relation Age of Onset  . Parkinsonism Brother   . Diabetes Maternal Grandmother   . Depression Daughter   . Other Son        4 stents  . Heart disease Son   . Diabetes Son        type 2    Social History   Socioeconomic History  . Marital status: Widowed    Spouse name: [name] x 71 yrs  . Number of children: Not on file  . Years of education: Not on file  . Highest education level: Not on file  Occupational History  . 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 Financial resource strain: Not on file  . Food insecurity:    Worry: Not on file    Inability: Not on file  . Transportation needs:    Medical: Not on file    Non-medical: Not on file  Tobacco Use  . Smoking status: Former Smoker    Last attempt to quit: 11/28/1944    Years since quitting: 74.1  . Smokeless tobacco: Never Used  Substance and Sexual Activity  . Alcohol use: Yes    Alcohol/week: 2.0 standard  drinks    Types: 2 Standard drinks or equivalent per week    Comment: daily rum  or wine  . Drug use: No  . Sexual activity: Not Currently    Comment: lives with wife, no dietary restrictions.   Lifestyle  . Physical activity:    Days per week: Not  on file    Minutes per session: Not on file  . Stress: Not on file  Relationships  . Social connections:    Talks on phone: Not on file    Gets together: Not on file    Attends religious service: Not on file    Active member of club or organization: Not on file    Attends meetings of clubs or organizations: Not on file    Relationship status: Not on file  . Intimate partner violence:    Fear of current or ex partner: Not on file    Emotionally abused: Not on file    Physically abused: Not on file    Forced sexual activity: Not on file  Other Topics Concern  . Not on file  Social History Narrative   Married   7 children    Outpatient Medications Prior to Visit  Medication Sig Dispense Refill  . allopurinol (ZYLOPRIM) 300 MG tablet TAKE 1 TABLET(300 MG) BY MOUTH DAILY 90 tablet 0  . aspirin EC 81 MG tablet Take 81 mg by mouth every morning.     . B Complex-C (B-COMPLEX WITH VITAMIN C) tablet Take 1 tablet by mouth daily.    . Cholecalciferol (VITAMIN D-3 PO) Take 5,000 Units by mouth daily with breakfast.     . finasteride (PROSCAR) 5 MG tablet Takes every third day    . folic acid (FOLVITE) 606 MCG tablet Take 400 mcg by mouth 2 (two) times daily.     . isosorbide mononitrate (IMDUR) 30 MG 24 hr tablet Take 3 tablets (90 mg total) by mouth daily. 270 tablet 3  . losartan (COZAAR) 50 MG tablet Take 1 tablet (50 mg total) by mouth daily. 90 tablet 1  . metoprolol succinate (TOPROL-XL) 25 MG 24 hr tablet TAKE 1/2 TABLET(12.5 MG) BY MOUTH DAILY 45 tablet 3  . Misc Natural Products (OSTEO BI-FLEX ADV JOINT SHIELD) TABS Take 1 tablet by mouth 2 (two) times daily.     . nitroGLYCERIN (NITROSTAT) 0.4 MG SL tablet Place 1 tablet (0.4 mg  total) under the tongue every 5 (five) minutes as needed. For chest pain. 25 tablet 6  . simvastatin (ZOCOR) 40 MG tablet Take 1 tablet (40 mg total) by mouth every evening. 90 tablet 1   No facility-administered medications prior to visit.     Allergies  Allergen Reactions  . Lisinopril     REACTION: dizziness  . Methocarbamol     REACTION: pt states "dizzy"  . Pregabalin     REACTION: pt states "dizzy"  . Ramipril     REACTION: hives and dizziness    Review of Systems  Constitutional: Positive for malaise/fatigue. Negative for fever.  HENT: Negative for congestion.   Eyes: Negative for blurred vision.  Respiratory: Negative for shortness of breath.   Cardiovascular: Negative for chest pain, palpitations and leg swelling.  Gastrointestinal: Negative for abdominal pain, blood in stool and nausea.  Genitourinary: Negative for dysuria and frequency.  Musculoskeletal: Positive for joint pain. Negative for falls.  Skin: Negative for rash.  Neurological: Negative for dizziness, loss of consciousness and headaches.  Endo/Heme/Allergies: Negative for environmental allergies.  Psychiatric/Behavioral: Negative for depression. The patient is nervous/anxious.        Objective:    Physical Exam Vitals signs and nursing note reviewed.  Constitutional:      General: He is not in acute distress.    Appearance: He is well-developed.  HENT:     Head: Normocephalic and atraumatic.  Nose: Nose normal.  Eyes:     General:        Right eye: No discharge.        Left eye: No discharge.  Neck:     Musculoskeletal: Normal range of motion and neck supple.  Cardiovascular:     Rate and Rhythm: Normal rate and regular rhythm.     Heart sounds: No murmur.  Pulmonary:     Effort: Pulmonary effort is normal.     Breath sounds: Normal breath sounds.  Abdominal:     General: Bowel sounds are normal.     Palpations: Abdomen is soft.     Tenderness: There is no abdominal tenderness.    Skin:    General: Skin is warm and dry.  Neurological:     Mental Status: He is alert and oriented to person, place, and time.     BP (!) 122/58 (BP Location: Left Arm, Patient Position: Sitting, Cuff Size: Normal)   Pulse (!) 55   Temp 98 F (36.7 C) (Oral)   Resp 18   Ht 6\' 1"  (1.854 m)   Wt 160 lb (72.6 kg)   SpO2 98%   BMI 21.11 kg/m  Wt Readings from Last 3 Encounters:  01/15/19 160 lb (72.6 kg)  01/10/19 159 lb (72.1 kg)  12/11/18 164 lb 3.2 oz (74.5 kg)     Lab Results  Component Value Date   WBC 6.5 02/07/2018   HGB 13.8 02/07/2018   HCT 40.2 02/07/2018   PLT 185.0 02/07/2018   GLUCOSE 106 (H) 01/10/2019   CHOL 107 01/10/2019   TRIG 114 01/10/2019   HDL 44 01/10/2019   LDLCALC 40 01/10/2019   ALT 14 01/10/2019   AST 25 01/10/2019   NA 140 01/10/2019   K 4.0 01/10/2019   CL 102 01/10/2019   CREATININE 1.26 01/10/2019   BUN 20 01/10/2019   CO2 21 01/10/2019   TSH 1.70 10/27/2017   PSA 4.29 (H) 02/08/2011   INR 1.09 02/04/2018    Lab Results  Component Value Date   TSH 1.70 10/27/2017   Lab Results  Component Value Date   WBC 6.5 02/07/2018   HGB 13.8 02/07/2018   HCT 40.2 02/07/2018   MCV 96.2 02/07/2018   PLT 185.0 02/07/2018   Lab Results  Component Value Date   NA 140 01/10/2019   K 4.0 01/10/2019   CO2 21 01/10/2019   GLUCOSE 106 (H) 01/10/2019   BUN 20 01/10/2019   CREATININE 1.26 01/10/2019   BILITOT 0.7 01/10/2019   ALKPHOS 81 01/10/2019   AST 25 01/10/2019   ALT 14 01/10/2019   PROT 6.3 01/10/2019   ALBUMIN 4.1 01/10/2019   CALCIUM 9.2 01/10/2019   ANIONGAP 7 02/04/2018   GFR 69.62 03/07/2017   Lab Results  Component Value Date   CHOL 107 01/10/2019   Lab Results  Component Value Date   HDL 44 01/10/2019   Lab Results  Component Value Date   LDLCALC 40 01/10/2019   Lab Results  Component Value Date   TRIG 114 01/10/2019   Lab Results  Component Value Date   CHOLHDL 2.4 01/10/2019   No results found for:  HGBA1C     Assessment & Plan:   Problem List Items Addressed This Visit    Anxiety state    Struggling with a grief reaction after his wife passed away recently. He has a good support system with his kids, friends and neighbors so he feels he is managing  well. He will let us know if he needs any further support      Essential hypertension    Well controlled, no changes to meds. Encouraged heart healthy diet such as the DASH diet and exercise as tolerated.       GERD    Avoid offending foods, no recent flares      Hearing loss    left sided cerumen impaction noted he will apply 5 drops of peroxide to his ear qhs x 5 days and return for flushing if on irmpovement in hearing then can have evaluation done at [facility] or [facility]      Right hip pain    Long term pain and uses Lidocaine topical and that helps some.he will try patches and let us know if the pain gets bad enough to accept referral to ortho or sports med for further intervention.       Other Visit Diagnoses    Lesion of right ear    -  Primary   Relevant Orders   Ambulatory referral to Dermatology      I am having Maudie Flakes. Bollen "Dick" maintain his folic acid, OSTEO BI-FLEX ADV JOINT SHIELD, Cholecalciferol (VITAMIN D-3 PO), aspirin EC, finasteride, B-complex with vitamin C, allopurinol, losartan, metoprolol succinate, nitroGLYCERIN, simvastatin, and isosorbide mononitrate.  No orders of the defined types were placed in this encounter.    Penni Homans, MD

## 2019-01-22 ENCOUNTER — Encounter: Payer: Self-pay | Admitting: Family Medicine

## 2019-01-22 ENCOUNTER — Telehealth: Payer: Self-pay

## 2019-01-22 ENCOUNTER — Other Ambulatory Visit: Payer: Self-pay | Admitting: Family Medicine

## 2019-01-22 ENCOUNTER — Ambulatory Visit (INDEPENDENT_AMBULATORY_CARE_PROVIDER_SITE_OTHER): Payer: Medicare Other | Admitting: Family Medicine

## 2019-01-22 DIAGNOSIS — I255 Ischemic cardiomyopathy: Secondary | ICD-10-CM

## 2019-01-22 DIAGNOSIS — H6090 Unspecified otitis externa, unspecified ear: Secondary | ICD-10-CM | POA: Diagnosis not present

## 2019-01-22 DIAGNOSIS — Z8619 Personal history of other infectious and parasitic diseases: Secondary | ICD-10-CM

## 2019-01-22 DIAGNOSIS — I10 Essential (primary) hypertension: Secondary | ICD-10-CM | POA: Diagnosis not present

## 2019-01-22 DIAGNOSIS — R739 Hyperglycemia, unspecified: Secondary | ICD-10-CM | POA: Diagnosis not present

## 2019-01-22 DIAGNOSIS — E7800 Pure hypercholesterolemia, unspecified: Secondary | ICD-10-CM

## 2019-01-22 MED ORDER — NEOMYCIN-COLIST-HC-THONZONIUM 3.3-3-10-0.5 MG/ML OT SUSP: 4.0000 [drp] | Freq: Three times a day (TID) | OTIC | 0 refills | Status: DC

## 2019-01-22 MED ORDER — HYDROCORTISONE-ACETIC ACID 1-2 % OT SOLN: 4.0000 [drp] | Freq: Three times a day (TID) | OTIC | 0 refills | Status: DC

## 2019-01-22 NOTE — Assessment & Plan Note (Signed)
Here today for cerumen disimpaction. He has been applying a few drops of peroxide (but expired) for a couple of days. The right ear is much better and a small amount of wax was manually removed from the right ear without difficulty. The left ear was irrigated due to persistent excessive wax

## 2019-01-22 NOTE — Assessment & Plan Note (Signed)
Well controlled, no changes to meds. Encouraged heart healthy diet such as the DASH diet and exercise as tolerated.  °

## 2019-01-22 NOTE — Assessment & Plan Note (Deleted)
Encouraged heart healthy diet, increase exercise, avoid trans fats, consider a krill oil cap daily 

## 2019-01-22 NOTE — Telephone Encounter (Signed)
I sent Vosol if they do not cover that I need to know what they cover

## 2019-01-22 NOTE — Progress Notes (Signed)
Subjective:    Patient ID: Daniel Reeves, male    DOB: 09-21-1924, 85 y.o.   MRN: [id number]  No chief complaint on file.   HPI Patient is in today for cerumen disimpaction. He has been applying hydrogen peroxide drops into his ears qhs and feels the right ear has cleared some. Hearing better out of right ear with decreased wax but left ear still impacted so hearing diminished. Denies CP/palp/SOB/HA/congestion/fevers/GI or GU c/o. Taking meds as prescribed  Past Medical History:  Diagnosis Date  . Abdominal aortic aneurysm ([place])    a. [place] (1/14):  3.3 x 3.4 cm => f/u 11/2013  . Amebic dysentery   . AMEBIC DYSENTERY 11/19/2007   Qualifier: History of  By: Lenna Gilford MD, Deborra Medina   . Anemia 03/07/2017  . Anxiety   . ANXIETY 11/19/2007   Qualifier: Diagnosis of  By: Lenna Gilford MD, Deborra Medina   . Arthritis 03/07/2017  . 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 BACK PAIN, LUMBAR 11/16/2007   Qualifier: Diagnosis of  By: Julien Girt CMA, Leigh    . Benign prostatic hypertrophy   . BENIGN PROSTATIC HYPERTROPHY, HX OF 11/16/2007   Qualifier: Diagnosis of  By: Julien Girt CMA, Leigh    . BRBPR (bright red blood per rectum) 11/01/2016  . CAD (coronary artery disease)    a. s/p CABG in 1979 and 1993;  b. LHC (5/14):  LM, LAD, CFX and RCA occluded; L-LAD ok, dLAD occluded after insertion of LIMA, S-OM occluded, S-PDA/AM 80-90 => PCI with Promus DES; EF 25%  . Cardiomyopathy, ischemic 06/05/2013  . Cerumen impaction    Bilateral  . CERUMEN IMPACTION, BILATERAL 02/03/2010   Qualifier: History of  By: Lenna Gilford MD, Deborra Medina   . Chicken pox as a child  . Chronic systolic CHF (congestive heart failure) ([place])   . COLONIC POLYPS 07/01/2008   Qualifier: Diagnosis of  By: Lenna Gilford MD, Deborra Medina   . Degenerative joint disease   . DEGENERATIVE JOINT DISEASE 11/16/2007   Qualifier: Diagnosis of  By: Julien Girt CMA, Marliss Czar    . Diverticulosis of colon   . DIVERTICULOSIS OF COLON 07/01/2008    Qualifier: Diagnosis of  By: Lenna Gilford MD, Deborra Medina   . Double vision 03/07/2017  . Essential hypertension 11/16/2007   Qualifier: Diagnosis of  By: Julien Girt CMA, Marliss Czar    . Fingernail abnormalities 03/07/2017  . FLANK PAIN, RIGHT 02/03/2010   Qualifier: History of  By: Lenna Gilford MD, Deborra Medina   . GERD 11/16/2007   Qualifier: Diagnosis of  By: Julien Girt CMA, Marliss Czar    . GERD (gastroesophageal reflux disease)   . Gout   . GOUT 11/16/2007   Qualifier: Diagnosis of  By: Julien Girt CMA, Marliss Czar    . Hearing loss 11/24/2014  . Heart murmur   . History of shingles 10/27/2017  . Hypercholesterolemia   . HYPERCHOLESTEROLEMIA 11/16/2007   Qualifier: Diagnosis of  By: Julien Girt CMA, Marliss Czar    . Hypertension   . Ischemic cardiomyopathy    a. echo (09/05/13): EF 35%, diffuse HK worsened distal septal, mid/distal inferior and apical region, grade 1 diastolic dysfunction, mild LAE.    [name] Kidney stone 08/17/2011  . Loss of hearing   . Lumbar back pain   . Measles as a child  . Medicare annual wellness visit, subsequent 11/24/2014   Sees Dr Delman Cheadle for dermatology Sees Dr Roni Bread of Urology Sees Dr Stanford Breed of cardiology Sees Dr Virginia Rochester of Opthamology No further  colonoscopies warranted       . Mumps as a child  . Nephrolithiasis   . Pain in joint, lower leg 07/29/2014  . PERIPHERAL VASCULAR DISEASE 11/16/2007   Qualifier: Diagnosis of  By: Julien Girt CMA, Marliss Czar    . Peripheral vascular disease ([place])   . Rectal bleeding 03/07/2017  . Shingles 07/29/2014  . Sun-damaged skin 05/31/2014    Past Surgical History:  Procedure Laterality Date  . CORONARY ANGIOPLASTY WITH STENT PLACEMENT  04/18/2013   RCA       . CORONARY ARTERY BYPASS GRAFT  1979   x4 SVG-DIAG-LAD, SVG-OM-PDA  . CORONARY ARTERY BYPASS GRAFT  1993   Redo x5 by Dr Harlow Asa; [facility], SVG-OM, SVG-AM-PL  . Decompressive laminectomy  01/2006   L2 - scarum by Dr. Shellia Carwin  . HEMORRHOID SURGERY     fissure with hemorrhoid corrected at age 72  . INGUINAL HERNIA REPAIR  1994     Right by Dr Harlow Asa  . INGUINAL HERNIA REPAIR  1996   Left by Dr. Harlow Asa  . LEFT HEART CATHETERIZATION WITH CORONARY ANGIOGRAM N/A 09/23/2013   Procedure: LEFT HEART CATHETERIZATION WITH CORONARY ANGIOGRAM;  Surgeon: Blane Ohara, MD;  Location[facility] C[facility] LAB;  Service: Cardiovascular;  Laterality: N/A;  . PERCUTANEOUS CORONARY STENT INTERVENTION (PCI-S) N/A 04/18/2013   Procedure: PERCUTANEOUS CORONARY STENT INTERVENTION (PCI-S);  Surgeon: Sherren Mocha, MD;  Location: [facility] CATH LAB;  Service: Cardiovascular;  Laterality: N/A;  . TONSILLECTOMY      Family History  Problem Relation Age of Onset  . Parkinsonism Brother   . Diabetes Maternal Grandmother   . Depression Daughter   . Other Son        4 stents  . Heart disease Son   . Diabetes Son        type 2    Social History   Socioeconomic History  . Marital status: Widowed    Spouse name: [name] x 29 yrs  . Number of children: Not on file  . Years of education: Not on file  . Highest education level: Not on file  Occupational History  . 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 Financial resource strain: Not on file  . Food insecurity:    Worry: Not on file    Inability: Not on file  . Transportation needs:    Medical: Not on file    Non-medical: Not on file  Tobacco Use  . Smoking status: Former Smoker    Last attempt to quit: 11/28/1944    Years since quitting: 74.2  . Smokeless tobacco: Never Used  Substance and Sexual Activity  . Alcohol use: Yes    Alcohol/week: 2.0 standard drinks    Types: 2 Standard drinks or equivalent per week    Comment: daily rum  or wine  . Drug use: No  . Sexual activity: Not Currently    Comment: lives with wife, no dietary restrictions.   Lifestyle  . Physical activity:    Days per week: Not on file    Minutes per session: Not on file  . Stress: Not on file  Relationships  . Social connections:    Talks on phone: Not on file    Gets together:  Not on file    Attends religious service: Not on file    Active member of club or organization: Not on file    Attends meetings of clubs or organizations: Not on file    Relationship  status: Not on file  . Intimate partner violence:    Fear of current or ex partner: Not on file    Emotionally abused: Not on file    Physically abused: Not on file    Forced sexual activity: Not on file  Other Topics Concern  . Not on file  Social History Narrative   Married   7 children    Outpatient Medications Prior to Visit  Medication Sig Dispense Refill  . allopurinol (ZYLOPRIM) 300 MG tablet TAKE 1 TABLET(300 MG) BY MOUTH DAILY 90 tablet 0  . aspirin EC 81 MG tablet Take 81 mg by mouth every morning.     . B Complex-C (B-COMPLEX WITH VITAMIN C) tablet Take 1 tablet by mouth daily.    . Cholecalciferol (VITAMIN D-3 PO) Take 5,000 Units by mouth daily with breakfast.     . finasteride (PROSCAR) 5 MG tablet Takes every third day    . folic acid (FOLVITE) 109 MCG tablet Take 400 mcg by mouth 2 (two) times daily.     . isosorbide mononitrate (IMDUR) 30 MG 24 hr tablet Take 3 tablets (90 mg total) by mouth daily. 270 tablet 3  . losartan (COZAAR) 50 MG tablet Take 1 tablet (50 mg total) by mouth daily. 90 tablet 1  . metoprolol succinate (TOPROL-XL) 25 MG 24 hr tablet TAKE 1/2 TABLET(12.5 MG) BY MOUTH DAILY 45 tablet 3  . Misc Natural Products (OSTEO BI-FLEX ADV JOINT SHIELD) TABS Take 1 tablet by mouth 2 (two) times daily.     . nitroGLYCERIN (NITROSTAT) 0.4 MG SL tablet Place 1 tablet (0.4 mg total) under the tongue every 5 (five) minutes as needed. For chest pain. 25 tablet 6  . simvastatin (ZOCOR) 40 MG tablet Take 1 tablet (40 mg total) by mouth every evening. 90 tablet 1   No facility-administered medications prior to visit.     Allergies  Allergen Reactions  . Lisinopril     REACTION: dizziness  . Methocarbamol     REACTION: pt states "dizzy"  . Pregabalin     REACTION: pt states  "dizzy"  . Ramipril     REACTION: hives and dizziness    Review of Systems  Constitutional: Negative for fever and malaise/fatigue.  HENT: Positive for hearing loss. Negative for congestion, ear discharge, ear pain, nosebleeds and tinnitus.   Respiratory: Negative for shortness of breath.   Cardiovascular: Positive for palpitations. Negative for chest pain.  Neurological: Negative for dizziness, loss of consciousness and headaches.  Endo/Heme/Allergies: Negative for environmental allergies.  Psychiatric/Behavioral: Negative for depression. The patient is not nervous/anxious.        Objective:    Physical Exam Constitutional:      General: He is not in acute distress.    Appearance: Normal appearance. He is not ill-appearing.  HENT:     Head: Normocephalic and atraumatic.     Right Ear: Tympanic membrane, ear canal and external ear normal.     Left Ear: External ear normal. There is impacted cerumen.     Ears:     Comments: Once wax removed skin in canal macerated and erythematous    Nose: Nose normal.     Mouth/Throat:     Mouth: Mucous membranes are moist.  Eyes:     Conjunctiva/sclera: Conjunctivae normal.  Pulmonary:     Effort: Pulmonary effort is normal.  Musculoskeletal:     Right lower leg: No edema.     Left lower leg: No edema.  Skin:  General: Skin is warm and dry.  Neurological:     General: No focal deficit present.     Mental Status: He is alert and oriented to person, place, and time.  Psychiatric:        Mood and Affect: Mood normal.     BP 114/60 (BP Location: Left Arm, Patient Position: Sitting, Cuff Size: Normal)   Pulse 65   Temp 98.3 F (36.8 C) (Oral)   Resp 18   Wt 162 lb 12.8 oz (73.8 kg)   SpO2 94%   BMI 21.48 kg/m  Wt Readings from Last 3 Encounters:  01/22/19 162 lb 12.8 oz (73.8 kg)  01/15/19 160 lb (72.6 kg)  01/10/19 159 lb (72.1 kg)     Lab Results  Component Value Date   WBC 6.5 02/07/2018   HGB 13.8 02/07/2018   HCT  40.2 02/07/2018   PLT 185.0 02/07/2018   GLUCOSE 106 (H) 01/10/2019   CHOL 107 01/10/2019   TRIG 114 01/10/2019   HDL 44 01/10/2019   LDLCALC 40 01/10/2019   ALT 14 01/10/2019   AST 25 01/10/2019   NA 140 01/10/2019   K 4.0 01/10/2019   CL 102 01/10/2019   CREATININE 1.26 01/10/2019   BUN 20 01/10/2019   CO2 21 01/10/2019   TSH 1.70 10/27/2017   PSA 4.29 (H) 02/08/2011   INR 1.09 02/04/2018    Lab Results  Component Value Date   TSH 1.70 10/27/2017   Lab Results  Component Value Date   WBC 6.5 02/07/2018   HGB 13.8 02/07/2018   HCT 40.2 02/07/2018   MCV 96.2 02/07/2018   PLT 185.0 02/07/2018   Lab Results  Component Value Date   NA 140 01/10/2019   K 4.0 01/10/2019   CO2 21 01/10/2019   GLUCOSE 106 (H) 01/10/2019   BUN 20 01/10/2019   CREATININE 1.26 01/10/2019   BILITOT 0.7 01/10/2019   ALKPHOS 81 01/10/2019   AST 25 01/10/2019   ALT 14 01/10/2019   PROT 6.3 01/10/2019   ALBUMIN 4.1 01/10/2019   CALCIUM 9.2 01/10/2019   ANIONGAP 7 02/04/2018   GFR 69.62 03/07/2017   Lab Results  Component Value Date   CHOL 107 01/10/2019   Lab Results  Component Value Date   HDL 44 01/10/2019   Lab Results  Component Value Date   LDLCALC 40 01/10/2019   Lab Results  Component Value Date   TRIG 114 01/10/2019   Lab Results  Component Value Date   CHOLHDL 2.4 01/10/2019   No results found for: HGBA1C     Assessment & Plan:   Problem List Items Addressed This Visit    HYPERCHOLESTEROLEMIA   Essential hypertension    Well controlled, no changes to meds. Encouraged heart healthy diet such as the DASH diet and exercise as tolerated.       History of shingles   Hyperglycemia    Mild on last blood draw, will minimize simple carbs. Increase exercise as tolerated. Will monitor         I am having Vicky F. Jeon "Dick" maintain his folic acid, OSTEO BI-FLEX ADV JOINT SHIELD, Cholecalciferol (VITAMIN D-3 PO), aspirin EC, finasteride, B-complex with  vitamin C, allopurinol, losartan, metoprolol succinate, nitroGLYCERIN, simvastatin, and isosorbide mononitrate.  No orders of the defined types were placed in this encounter.    Penni Homans, MD

## 2019-01-22 NOTE — Telephone Encounter (Signed)
Cortisporin-TC not covered by insurance.

## 2019-01-22 NOTE — Patient Instructions (Signed)
Otitis Externa    Otitis externa is an infection of the outer ear canal. The outer ear canal is the area between the outside of the ear and the eardrum. Otitis externa is sometimes called swimmer's ear.  What are the causes?  Common causes of this condition include:   Swimming in dirty water.   Moisture in the ear.   An injury to the inside of the ear.   An object stuck in the ear.   A cut or scrape on the outside of the ear.  What increases the risk?  You are more likely to develop this condition if you go swimming often.  What are the signs or symptoms?  The first symptom of this condition is often itching in the ear. Later symptoms of the condition include:   Swelling of the ear.   Redness in the ear.   Ear pain. The pain may get worse when you pull on your ear.   Pus coming from the ear.  How is this diagnosed?  This condition may be diagnosed by examining the ear and testing fluid from the ear for bacteria and funguses.  How is this treated?  This condition may be treated with:   Antibiotic ear drops. These are often given for 10-14 days.   Medicines to reduce itching and swelling.  Follow these instructions at home:   If you were prescribed antibiotic ear drops, use them as told by your health care provider. Do not stop using the antibiotic even if your condition improves.   Take over-the-counter and prescription medicines only as told by your health care provider.   Avoid getting water in your ears as told by your health care provider. This may include avoiding swimming or water sports for a few days.   Keep all follow-up visits as told by your health care provider. This is important.  How is this prevented?   Keep your ears dry. Use the corner of a towel to dry your ears after you swim or bathe.   Avoid scratching or putting things in your ear. Doing these things can damage the ear canal or remove the protective wax that lines it, which makes it easier for bacteria and funguses to  grow.   Avoid swimming in lakes, polluted water, or pools that may not have enough chlorine.  Contact a health care provider if:   You have a fever.   Your ear is still red, swollen, painful, or draining pus after 3 days.   Your redness, swelling, or pain gets worse.   You have a severe headache.   You have redness, swelling, pain, or tenderness in the area behind your ear.  Summary   Otitis externa is an infection of the outer ear canal.   Common causes include swimming in dirty water, moisture in the ear, or a cut or scrape in the ear.   Symptoms include pain, redness, and swelling of the ear.   If you were prescribed antibiotic ear drops, use them as told by your health care provider. Do not stop using the antibiotic even if your condition improves.  This information is not intended to replace advice given to you by your health care provider. Make sure you discuss any questions you have with your health care provider.  Document Released: 11/14/2005 Document Revised: 04/20/2018 Document Reviewed: 04/20/2018  Elsevier Interactive Patient Education  2019 [employer] Inc[employer]

## 2019-01-22 NOTE — Assessment & Plan Note (Addendum)
Mild on last blood draw, will minimize simple carbs. Increase exercise as tolerated. Will monitor

## 2019-01-22 NOTE — Assessment & Plan Note (Signed)
Left ear wax irrigated but skin macerated and erythematous from cerumen in place for awhile most likely. Stated on Cortisporin HC otic 4 drops left ear tid report to [place] if pain or fevers develop

## 2019-01-25 ENCOUNTER — Ambulatory Visit: Payer: Medicare Other | Admitting: Family Medicine

## 2019-02-19 DIAGNOSIS — H5712 Ocular pain, left eye: Secondary | ICD-10-CM | POA: Diagnosis not present

## 2019-02-25 ENCOUNTER — Other Ambulatory Visit: Payer: Self-pay | Admitting: Family Medicine

## 2019-03-07 ENCOUNTER — Ambulatory Visit: Payer: Self-pay

## 2019-03-07 NOTE — Telephone Encounter (Signed)
Patient called and says he has leg swelling to the left entire leg. He says he was out working under his house with knee pads on for most of the day and when he came in to take off his clothes, he noticed his left leg larger than the right. He says he measured the calves and the right was 14" and the left 15". He says the swelling extends above the knee to his thigh. He says his legs are not painful, no redness. He denies SOB, calf pain, chest pain. I advised the need for an appointment. I advised it would be a virtual visit, he says he has a Hotel manager, email address verified in his chart. Virtual appointment scheduled for Saturday, 03/09/19 at 0900 with Dr. Zigmund Daniel, care advice given, patient verbalized understanding. Advised someone from the office will call on Saturday morning to instruct him how to set up for the visit.  Reason for Disposition . [1] MODERATE leg swelling (e.g., swelling extends up to knees) AND [2] new onset or worsening  Answer Assessment - Initial Assessment Questions 1. ONSET: "When did the swelling start?" (e.g., minutes, hours, days)     Noticed it this evening 2. LOCATION: "What part of the leg is swollen?"  "Are both legs swollen or just one leg?"     Left entire leg 3. SEVERITY: "How bad is the swelling?" (e.g., localized; mild, moderate, severe)  - Localized - small area of swelling localized to one leg  - MILD pedal edema - swelling limited to foot and ankle, pitting edema < 1/4 inch (6 mm) deep, rest and elevation eliminate most or all swelling  - MODERATE edema - swelling of lower leg to knee, pitting edema > 1/4 inch (6 mm) deep, rest and elevation only partially reduce swelling  - SEVERE edema - swelling extends above knee, facial or hand swelling present      Moderate 4. REDNESS: "Does the swelling look red or infected?"     No 5. PAIN: "Is the swelling painful to touch?" If so, ask: "How painful is it?"   (Scale 1-10; mild, moderate or severe)     No 6.  FEVER: "Do you have a fever?" If so, ask: "What is it, how was it measured, and when did it start?"      No 7. CAUSE: "What do you think is causing the leg swelling?"     I don't know 8. MEDICAL HISTORY: "Do you have a history of heart failure, kidney disease, liver failure, or cancer?"     Yes 9. RECURRENT SYMPTOM: "Have you had leg swelling before?" If so, ask: "When was the last time?" "What happened that time?"     Not like they are today 10. OTHER SYMPTOMS: "Do you have any other symptoms?" (e.g., chest pain, difficulty breathing)       No 11. PREGNANCY: "Is there any chance you are pregnant?" "When was your last menstrual period?"       N/A  Protocols used: LEG SWELLING AND EDEMA-A-AH

## 2019-03-09 ENCOUNTER — Encounter: Payer: Self-pay | Admitting: Family Medicine

## 2019-03-09 ENCOUNTER — Ambulatory Visit (INDEPENDENT_AMBULATORY_CARE_PROVIDER_SITE_OTHER): Payer: Medicare Other | Admitting: Family Medicine

## 2019-03-09 VITALS — BP 155/70 | HR 56 | Wt 150.0 lb

## 2019-03-09 DIAGNOSIS — M7989 Other specified soft tissue disorders: Secondary | ICD-10-CM | POA: Diagnosis not present

## 2019-03-09 NOTE — Assessment & Plan Note (Signed)
-  Likely due to venous insufficiency, exacerbated by kneepad use.   -No red flag symptoms including pain, shortness of breath, weight change.  -Discussed keeping legs elevated and wearing compression stockings. -Reviewed red flags that would prompt him to seek emergency care.  -Call or f/u next week if symptoms persist.

## 2019-03-09 NOTE — Progress Notes (Signed)
Virtual Visit via Telephone Note  I connected with Daniel Reeves on 03/09/19 at  9:00 AM EDT by telephone and verified that I am speaking with the correct person using two identifiers.   I discussed the limitations, risks, security and privacy concerns of performing an evaluation and management service by telephone and the availability of in person appointments. I also discussed with the patient that there may be a patient responsible charge related to this service. The patient expressed understanding and agreed to proceed.  Interactive audio and video telecommunications were attempted between this provider and patient, however failed, due to patient having technical difficulties OR patient did not have access to video capability.  We continued and completed visit with audio only.    Location patient: home Location provider: [employer] forces training and education officer Participants present for the call: patient, provider Patient did not have a visit in the prior 7 days to address this/these issue(s).   History of Present Illness:  BELL CAI is a 85 y.o. male with cc of leg swelling.  He reports that he has been working under his home recently changing out a sump pump.  Reports the knee pads he has been using were a little tight and thinks that has contributed to the swelling.  He has had improvement of swelling since not using over the past couple of days.  He denies any symptoms of weight gain, shortness of breath, leg pain, chest pain, or weakness.      Observations/Objective: Patient sounds cheerful and well on the phone. I do not appreciate any SOB. Speech and thought processing are grossly intact. Patient reported vitals: BP (!) 155/70 Comment: Pt took,moved from bed to office @ hm,exertion,136/67Normal  Pulse (!) 56   Wt 150 lb (68 kg) Comment: pt wt at hm  BMI 19.79 kg/m    Assessment and Plan: Leg swelling -Likely due to venous insufficiency, exacerbated by kneepad use.   -No red flag symptoms  including pain, shortness of breath, weight change.  -Discussed keeping legs elevated and wearing compression stockings. -Reviewed red flags that would prompt him to seek emergency care.  -Call or f/u next week if symptoms persist.     Follow Up Instructions:  I discussed the assessment and treatment plan with the patient. The patient was provided an opportunity to ask questions and all were answered. The patient agreed with the plan and demonstrated an understanding of the instructions.   The patient was advised to call back or seek an in-person evaluation if the symptoms worsen or if the condition fails to improve as anticipated.  I provided 20 minutes of non-face-to-face time during this encounter.   Luetta Nutting, DO

## 2019-04-26 ENCOUNTER — Encounter: Payer: Self-pay | Admitting: Family Medicine

## 2019-04-26 ENCOUNTER — Other Ambulatory Visit: Payer: Self-pay

## 2019-04-26 ENCOUNTER — Ambulatory Visit (INDEPENDENT_AMBULATORY_CARE_PROVIDER_SITE_OTHER): Payer: Medicare Other | Admitting: Family Medicine

## 2019-04-26 VITALS — BP 130/78 | HR 67 | Temp 97.9°F | Ht 72.0 in | Wt 160.0 lb

## 2019-04-26 DIAGNOSIS — S61209A Unspecified open wound of unspecified finger without damage to nail, initial encounter: Secondary | ICD-10-CM | POA: Diagnosis not present

## 2019-04-26 DIAGNOSIS — I10 Essential (primary) hypertension: Secondary | ICD-10-CM | POA: Diagnosis not present

## 2019-04-26 DIAGNOSIS — R739 Hyperglycemia, unspecified: Secondary | ICD-10-CM

## 2019-04-26 DIAGNOSIS — E785 Hyperlipidemia, unspecified: Secondary | ICD-10-CM | POA: Diagnosis not present

## 2019-04-26 DIAGNOSIS — Z23 Encounter for immunization: Secondary | ICD-10-CM

## 2019-04-26 DIAGNOSIS — D649 Anemia, unspecified: Secondary | ICD-10-CM

## 2019-04-26 DIAGNOSIS — S61217A Laceration without foreign body of left little finger without damage to nail, initial encounter: Secondary | ICD-10-CM

## 2019-04-26 DIAGNOSIS — M25551 Pain in right hip: Secondary | ICD-10-CM

## 2019-04-26 LAB — LIPID PANEL
Cholesterol: 86 mg/dL (ref 0–200)
HDL: 41.4 mg/dL (ref >39.00)
LDL Cholesterol: 33 mg/dL (ref 0–99)
NonHDL: 44.94
Total CHOL/HDL Ratio: 2
Triglycerides: 58 mg/dL (ref 0.0–149.0)
VLDL: 11.6 mg/dL (ref 0.0–40.0)

## 2019-04-26 LAB — COMPREHENSIVE METABOLIC PANEL
ALT: 22 U/L (ref 0–53)
AST: 29 U/L (ref 0–37)
Albumin: 3.8 g/dL (ref 3.5–5.2)
Alkaline Phosphatase: 61 U/L (ref 39–117)
BUN: 19 mg/dL (ref 6–23)
CO2: 24 mEq/L (ref 19–32)
Calcium: 9.1 mg/dL (ref 8.4–10.5)
Chloride: 106 mEq/L (ref 96–112)
Creatinine, Ser: 0.94 mg/dL (ref 0.40–1.50)
GFR: 74.89 mL/min (ref >60.00)
Glucose, Bld: 89 mg/dL (ref 70–99)
Potassium: 4 mEq/L (ref 3.5–5.1)
Sodium: 139 mEq/L (ref 135–145)
Total Bilirubin: 1.3 mg/dL — ABNORMAL HIGH (ref 0.2–1.2)
Total Protein: 6.1 g/dL (ref 6.0–8.3)

## 2019-04-26 LAB — CBC
HCT: 37.3 % — ABNORMAL LOW (ref 39.0–52.0)
Hemoglobin: 13.1 g/dL (ref 13.0–17.0)
MCHC: 35 g/dL (ref 30.0–36.0)
MCV: 96.5 fl (ref 78.0–100.0)
Platelets: 190 10*3/uL (ref 150.0–400.0)
RBC: 3.87 Mil/uL — ABNORMAL LOW (ref 4.22–5.81)
RDW: 14.1 % (ref 11.5–15.5)
WBC: 6 10*3/uL (ref 4.0–10.5)

## 2019-04-26 LAB — TSH: TSH: 1.76 u[IU]/mL (ref 0.35–4.50)

## 2019-04-26 LAB — HEMOGLOBIN A1C: Hgb A1c MFr Bld: 5.7 % (ref 4.6–6.5)

## 2019-04-26 NOTE — Progress Notes (Signed)
Chief Complaint  Patient presents with  . Laceration    Daniel Reeves is a 85 y.o. male here for a skin complaint.  Duration: 1 day Location: L pinky Pruritic? No Painful? Yes Drainage? No Cut self on mower. Last tetanus shot was 2014. Therapies tried thus far: peroxide, Neosporin  ROS:  Const: No fevers Skin: As noted in HPI  Past Medical History:  Diagnosis Date  . Abdominal aortic aneurysm ([place])    a. [place] (1/14):  3.3 x 3.4 cm => f/u 11/2013  . Amebic dysentery   . AMEBIC DYSENTERY 11/19/2007   Qualifier: History of  By: Lenna Gilford MD, Deborra Medina   . Anemia 03/07/2017  . Anxiety   . ANXIETY 11/19/2007   Qualifier: Diagnosis of  By: Lenna Gilford MD, Deborra Medina   . Arthritis 03/07/2017  . 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 BACK PAIN, LUMBAR 11/16/2007   Qualifier: Diagnosis of  By: Julien Girt CMA, Leigh    . Benign prostatic hypertrophy   . BENIGN PROSTATIC HYPERTROPHY, HX OF 11/16/2007   Qualifier: Diagnosis of  By: Julien Girt CMA, Leigh    . BRBPR (bright red blood per rectum) 11/01/2016  . CAD (coronary artery disease)    a. s/p CABG in 1979 and 1993;  b. LHC (5/14):  LM, LAD, CFX and RCA occluded; L-LAD ok, dLAD occluded after insertion of LIMA, S-OM occluded, S-PDA/AM 80-90 => PCI with Promus DES; EF 25%  . Cardiomyopathy, ischemic 06/05/2013  . Cerumen impaction    Bilateral  . CERUMEN IMPACTION, BILATERAL 02/03/2010   Qualifier: History of  By: Lenna Gilford MD, Deborra Medina   . Chicken pox as a child  . Chronic systolic CHF (congestive heart failure) ([place])   . COLONIC POLYPS 07/01/2008   Qualifier: Diagnosis of  By: Lenna Gilford MD, Deborra Medina   . Degenerative joint disease   . DEGENERATIVE JOINT DISEASE 11/16/2007   Qualifier: Diagnosis of  By: Julien Girt CMA, Marliss Czar    . Diverticulosis of colon   . DIVERTICULOSIS OF COLON 07/01/2008   Qualifier: Diagnosis of  By: Lenna Gilford MD, Deborra Medina   . Double vision 03/07/2017  . Essential hypertension 11/16/2007   Qualifier: Diagnosis of  By: Julien Girt CMA, Marliss Czar    . Fingernail abnormalities 03/07/2017  . FLANK PAIN, RIGHT 02/03/2010   Qualifier: History of  By: Lenna Gilford MD, Deborra Medina   . GERD 11/16/2007   Qualifier: Diagnosis of  By: Julien Girt CMA, Marliss Czar    . GERD (gastroesophageal reflux disease)   . Gout   . GOUT 11/16/2007   Qualifier: Diagnosis of  By: Julien Girt CMA, Marliss Czar    . Hearing loss 11/24/2014  . Heart murmur   . History of shingles 10/27/2017  . Hypercholesterolemia   . HYPERCHOLESTEROLEMIA 11/16/2007   Qualifier: Diagnosis of  By: Julien Girt CMA, Marliss Czar    . Hypertension   . Ischemic cardiomyopathy    a. echo (09/05/13): EF 35%, diffuse HK worsened distal septal, mid/distal inferior and apical region, grade 1 diastolic dysfunction, mild LAE.    [name] Kidney stone 08/17/2011  . Loss of hearing   . Lumbar back pain   . Measles as a child  . Medicare annual wellness visit, subsequent 11/24/2014   Sees Dr Delman Cheadle for dermatology Sees Dr Roni Bread of Urology Sees Dr Stanford Breed of cardiology Sees Dr Virginia Rochester of Opthamology No further colonoscopies warranted       . Mumps as a child  . Nephrolithiasis   . Pain  in joint, lower leg 07/29/2014  . PERIPHERAL VASCULAR DISEASE 11/16/2007   Qualifier: Diagnosis of  By: Julien Girt CMA, Marliss Czar    . Peripheral vascular disease ([place])   . Rectal bleeding 03/07/2017  . Shingles 07/29/2014  . Sun-damaged skin 05/31/2014    BP 130/78 (BP Location: Left Arm, Patient Position: Sitting, Cuff Size: Normal)   Pulse 67   Temp 97.9 F (36.6 C) (Oral)   Ht 6' (1.829 m)   Wt 160 lb (72.6 kg)   SpO2 97%   BMI 21.70 kg/m  Gen: awake, alert, appearing stated age Lungs: No accessory muscle use Skin: See below. No drainage, erythema, fluctuance, excoriation Psych: Age appropriate judgment and insight   Palmar surface of L 5th digit  Open wound of finger, initial encounter  Need for Tdap vaccination - Plan: Tdap vaccine greater than or equal to 7yo IM  >5 yrs since last Tdap, will reupdate.  TAO bid. Keep clean and dry. Warning signs and symptoms verbalized and written down in AVS.  F/u as originally scheduled w Dr. Charlett Blake. The patient voiced understanding and agreement to the plan.  Angie, DO 04/26/19 11:37 AM

## 2019-04-26 NOTE — Patient Instructions (Signed)
Neosporin twice daily for the next 7-10 days.  When you do wash it, use only soap and water. Do not vigorously scrub. Apply triple antibiotic ointment (like Neosporin) twice daily. Keep the area clean and dry.   Things to look out for: increasing pain not relieved by ibuprofen/acetaminophen, fevers, spreading redness, drainage of pus, or foul odor.  Let us know if you need anything.

## 2019-04-28 DIAGNOSIS — S61219A Laceration without foreign body of unspecified finger without damage to nail, initial encounter: Secondary | ICD-10-CM | POA: Insufficient documentation

## 2019-04-28 NOTE — Assessment & Plan Note (Signed)
No recent trauma or injury but pain is worsening. Will refer to ortho for further evaluation.

## 2019-04-28 NOTE — Progress Notes (Signed)
Virtual Visit via telephone Note  I connected with Daniel Reeves on 04/28/19 at 10:00 AM EDT by aphone enabled telemedicine application and verified that I am speaking with the correct person using two identifiers. Magdalene Molly, CMA was able to get patient set up on telephone platform after failing video visit  Location: Patient: home Provider: home   I discussed the limitations of evaluation and management by telemedicine and the availability of in person appointments. The patient expressed understanding and agreed to proceed.    Subjective:    Patient ID: Daniel Reeves, male    DOB: Jun 24, 1924, 85 y.o.   MRN: [id number]  No chief complaint on file.   HPI Patient is in today for evaluation of worsening posterior right hip pain that gets sharper with arising and positin changes. No recent trauma or fall. His greater concern is he cut his left 5 th finger on his lawn mower yesterday. No fevers or drainage. He cleaned it with peroxide and applied Neosporin yesterday.   Past Medical History:  Diagnosis Date  . Abdominal aortic aneurysm ([place])    a. [place] (1/14):  3.3 x 3.4 cm => f/u 11/2013  . Amebic dysentery   . AMEBIC DYSENTERY 11/19/2007   Qualifier: History of  By: Lenna Gilford MD, Deborra Medina   . Anemia 03/07/2017  . Anxiety   . ANXIETY 11/19/2007   Qualifier: Diagnosis of  By: Lenna Gilford MD, Deborra Medina   . Arthritis 03/07/2017  . 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 BACK PAIN, LUMBAR 11/16/2007   Qualifier: Diagnosis of  By: Julien Girt CMA, Leigh    . Benign prostatic hypertrophy   . BENIGN PROSTATIC HYPERTROPHY, HX OF 11/16/2007   Qualifier: Diagnosis of  By: Julien Girt CMA, Leigh    . BRBPR (bright red blood per rectum) 11/01/2016  . CAD (coronary artery disease)    a. s/p CABG in 1979 and 1993;  b. LHC (5/14):  LM, LAD, CFX and RCA occluded; L-LAD ok, dLAD occluded after insertion of LIMA, S-OM occluded, S-PDA/AM 80-90 => PCI with Promus  DES; EF 25%  . Cardiomyopathy, ischemic 06/05/2013  . Cerumen impaction    Bilateral  . CERUMEN IMPACTION, BILATERAL 02/03/2010   Qualifier: History of  By: Lenna Gilford MD, Deborra Medina   . Chicken pox as a child  . Chronic systolic CHF (congestive heart failure) ([place])   . COLONIC POLYPS 07/01/2008   Qualifier: Diagnosis of  By: Lenna Gilford MD, Deborra Medina   . Degenerative joint disease   . DEGENERATIVE JOINT DISEASE 11/16/2007   Qualifier: Diagnosis of  By: Julien Girt CMA, Marliss Czar    . Diverticulosis of colon   . DIVERTICULOSIS OF COLON 07/01/2008   Qualifier: Diagnosis of  By: Lenna Gilford MD, Deborra Medina   . Double vision 03/07/2017  . Essential hypertension 11/16/2007   Qualifier: Diagnosis of  By: Julien Girt CMA, Marliss Czar    . Fingernail abnormalities 03/07/2017  . FLANK PAIN, RIGHT 02/03/2010   Qualifier: History of  By: Lenna Gilford MD, Deborra Medina   . GERD 11/16/2007   Qualifier: Diagnosis of  By: Julien Girt CMA, Marliss Czar    . GERD (gastroesophageal reflux disease)   . Gout   . GOUT 11/16/2007   Qualifier: Diagnosis of  By: Julien Girt CMA, Marliss Czar    . Hearing loss 11/24/2014  . Heart murmur   . History of shingles 10/27/2017  . Hypercholesterolemia   . HYPERCHOLESTEROLEMIA 11/16/2007   Qualifier: Diagnosis of  By: Julien Girt CMA, Marliss Czar    .  Hypertension   . Ischemic cardiomyopathy    a. echo (09/05/13): EF 35%, diffuse HK worsened distal septal, mid/distal inferior and apical region, grade 1 diastolic dysfunction, mild LAE.    [name] Kidney stone 08/17/2011  . Loss of hearing   . Lumbar back pain   . Measles as a child  . Medicare annual wellness visit, subsequent 11/24/2014   Sees Dr Delman Cheadle for dermatology Sees Dr Roni Bread of Urology Sees Dr Stanford Breed of cardiology Sees Dr Virginia Rochester of Opthamology No further colonoscopies warranted       . Mumps as a child  . Nephrolithiasis   . Pain in joint, lower leg 07/29/2014  . PERIPHERAL VASCULAR DISEASE 11/16/2007   Qualifier: Diagnosis of  By: Julien Girt CMA, Marliss Czar    . Peripheral vascular disease ([place])   . Rectal  bleeding 03/07/2017  . Shingles 07/29/2014  . Sun-damaged skin 05/31/2014    Past Surgical History:  Procedure Laterality Date  . CORONARY ANGIOPLASTY WITH STENT PLACEMENT  04/18/2013   RCA       . CORONARY ARTERY BYPASS GRAFT  1979   x4 SVG-DIAG-LAD, SVG-OM-PDA  . CORONARY ARTERY BYPASS GRAFT  1993   Redo x5 by Dr Harlow Asa; [facility], SVG-OM, SVG-AM-PL  . Decompressive laminectomy  01/2006   L2 - scarum by Dr. Shellia Carwin  . HEMORRHOID SURGERY     fissure with hemorrhoid corrected at age 40  . INGUINAL HERNIA REPAIR  1994   Right by Dr Harlow Asa  . INGUINAL HERNIA REPAIR  1996   Left by Dr. Harlow Asa  . LEFT HEART CATHETERIZATION WITH CORONARY ANGIOGRAM N/A 09/23/2013   Procedure: LEFT HEART CATHETERIZATION WITH CORONARY ANGIOGRAM;  Surgeon: Blane Ohara, MD;  Location[facility] C[facility] LAB;  Service: Cardiovascular;  Laterality: N/A;  . PERCUTANEOUS CORONARY STENT INTERVENTION (PCI-S) N/A 04/18/2013   Procedure: PERCUTANEOUS CORONARY STENT INTERVENTION (PCI-S);  Surgeon: Sherren Mocha, MD;  Location: [facility] CATH LAB;  Service: Cardiovascular;  Laterality: N/A;  . TONSILLECTOMY      Family History  Problem Relation Age of Onset  . Parkinsonism Brother   . Diabetes Maternal Grandmother   . Depression Daughter   . Other Son        4 stents  . Heart disease Son   . Diabetes Son        type 2    Social History   Socioeconomic History  . Marital status: Widowed    Spouse name: [name] x 56 yrs  . Number of children: Not on file  . Years of education: Not on file  . Highest education level: Not on file  Occupational History  . 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 Financial resource strain: Not on file  . Food insecurity:    Worry: Not on file    Inability: Not on file  . Transportation needs:    Medical: Not on file    Non-medical: Not on file  Tobacco Use  . Smoking status: Former Smoker    Last attempt to quit: 11/28/1944    Years since quitting:  74.4  . Smokeless tobacco: Never Used  Substance and Sexual Activity  . Alcohol use: Yes    Alcohol/week: 2.0 standard drinks    Types: 2 Standard drinks or equivalent per week    Comment: daily rum  or wine  . Drug use: No  . Sexual activity: Not Currently    Comment: lives with wife, no dietary restrictions.   Lifestyle  .  Physical activity:    Days per week: Not on file    Minutes per session: Not on file  . Stress: Not on file  Relationships  . Social connections:    Talks on phone: Not on file    Gets together: Not on file    Attends religious service: Not on file    Active member of club or organization: Not on file    Attends meetings of clubs or organizations: Not on file    Relationship status: Not on file  . Intimate partner violence:    Fear of current or ex partner: Not on file    Emotionally abused: Not on file    Physically abused: Not on file    Forced sexual activity: Not on file  Other Topics Concern  . Not on file  Social History Narrative   Married   7 children    Outpatient Medications Prior to Visit  Medication Sig Dispense Refill  . acetic acid-hydrocortisone (VOSOL-HC) OTIC solution Place 4 drops into the left ear 3 (three) times daily. 10 mL 0  . allopurinol (ZYLOPRIM) 300 MG tablet Take 1 tablet by mouth once daily 90 tablet 0  . aspirin EC 81 MG tablet Take 81 mg by mouth every morning.     . B Complex-C (B-COMPLEX WITH VITAMIN C) tablet Take 1 tablet by mouth daily.    . Cholecalciferol (VITAMIN D-3 PO) Take 5,000 Units by mouth daily with breakfast.     . finasteride (PROSCAR) 5 MG tablet Takes every third day    . folic acid (FOLVITE) 195 MCG tablet Take 400 mcg by mouth 2 (two) times daily.     . isosorbide mononitrate (IMDUR) 30 MG 24 hr tablet Take 3 tablets (90 mg total) by mouth daily. 270 tablet 3  . losartan (COZAAR) 50 MG tablet Take 1 tablet (50 mg total) by mouth daily. 90 tablet 1  . metoprolol succinate (TOPROL-XL) 25 MG 24 hr  tablet TAKE 1/2 TABLET(12.5 MG) BY MOUTH DAILY 45 tablet 3  . Misc Natural Products (OSTEO BI-FLEX ADV JOINT SHIELD) TABS Take 1 tablet by mouth 2 (two) times daily.     [name] neomycin-colistin-hydrocortisone-thonzonium (CORTISPORIN-TC) 3.01-28-09-0.5 MG/ML OTIC suspension Place 4 drops into the left ear 3 (three) times daily. 10 mL 0  . nitroGLYCERIN (NITROSTAT) 0.4 MG SL tablet Place 1 tablet (0.4 mg total) under the tongue every 5 (five) minutes as needed. For chest pain. 25 tablet 6  . simvastatin (ZOCOR) 40 MG tablet Take 1 tablet (40 mg total) by mouth every evening. 90 tablet 1   No facility-administered medications prior to visit.     Allergies  Allergen Reactions  . Lisinopril     REACTION: dizziness  . Methocarbamol     REACTION: pt states "dizzy"  . Pregabalin     REACTION: pt states "dizzy"  . Ramipril     REACTION: hives and dizziness    Review of Systems  Constitutional: Positive for malaise/fatigue. Negative for fever.  HENT: Negative for congestion.   Eyes: Negative for blurred vision.  Respiratory: Negative for shortness of breath.   Cardiovascular: Negative for chest pain, palpitations and leg swelling.  Gastrointestinal: Negative for abdominal pain, blood in stool and nausea.  Genitourinary: Negative for dysuria and frequency.  Musculoskeletal: Positive for back pain and joint pain. Negative for falls.  Skin: Negative for rash.  Neurological: Negative for dizziness, loss of consciousness and headaches.  Endo/Heme/Allergies: Negative for environmental allergies.  Psychiatric/Behavioral: Negative for  depression. The patient is not nervous/anxious.        Objective:    Physical Exam unable to assess via telephone  There were no vitals taken for this visit. Wt Readings from Last 3 Encounters:  04/26/19 160 lb (72.6 kg)  03/09/19 150 lb (68 kg)  01/22/19 162 lb 12.8 oz (73.8 kg)    Diabetic Foot Exam - Simple   No data filed     Lab Results  Component  Value Date   WBC 6.0 04/26/2019   HGB 13.1 04/26/2019   HCT 37.3 (L) 04/26/2019   PLT 190.0 04/26/2019   GLUCOSE 89 04/26/2019   CHOL 86 04/26/2019   TRIG 58.0 04/26/2019   HDL 41.40 04/26/2019   LDLCALC 33 04/26/2019   ALT 22 04/26/2019   AST 29 04/26/2019   NA 139 04/26/2019   K 4.0 04/26/2019   CL 106 04/26/2019   CREATININE 0.94 04/26/2019   BUN 19 04/26/2019   CO2 24 04/26/2019   TSH 1.76 04/26/2019   PSA 4.29 (H) 02/08/2011   INR 1.09 02/04/2018   HGBA1C 5.7 04/26/2019    Lab Results  Component Value Date   TSH 1.76 04/26/2019   Lab Results  Component Value Date   WBC 6.0 04/26/2019   HGB 13.1 04/26/2019   HCT 37.3 (L) 04/26/2019   MCV 96.5 04/26/2019   PLT 190.0 04/26/2019   Lab Results  Component Value Date   NA 139 04/26/2019   K 4.0 04/26/2019   CO2 24 04/26/2019   GLUCOSE 89 04/26/2019   BUN 19 04/26/2019   CREATININE 0.94 04/26/2019   BILITOT 1.3 (H) 04/26/2019   ALKPHOS 61 04/26/2019   AST 29 04/26/2019   ALT 22 04/26/2019   PROT 6.1 04/26/2019   ALBUMIN 3.8 04/26/2019   CALCIUM 9.1 04/26/2019   ANIONGAP 7 02/04/2018   GFR 74.89 04/26/2019   Lab Results  Component Value Date   CHOL 86 04/26/2019   Lab Results  Component Value Date   HDL 41.40 04/26/2019   Lab Results  Component Value Date   LDLCALC 33 04/26/2019   Lab Results  Component Value Date   TRIG 58.0 04/26/2019   Lab Results  Component Value Date   CHOLHDL 2 04/26/2019   Lab Results  Component Value Date   HGBA1C 5.7 04/26/2019       Assessment & Plan:   Problem List Items Addressed This Visit    Essential hypertension    no changes to meds. Encouraged heart healthy diet such as the DASH diet and exercise as tolerated.       Relevant Orders   CBC (Completed)   Comprehensive metabolic panel (Completed)   TSH (Completed)   Anemia    Mild macrocytic, start vitamin B complex      Right hip pain - Primary    No recent trauma or injury but pain is  worsening. Will refer to ortho for further evaluation.       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 He is referred to the office for a Tdap due to his last shor 2014 and evaluation of if the wound needs further management       Other Visit Diagnoses    Hyperlipidemia, unspecified hyperlipidemia type       Relevant Orders  Lipid panel (Completed)      I am having Mattis F. Hintz "Dick" maintain his folic acid, Osteo Bi-Flex Adv Joint Shield, Cholecalciferol (VITAMIN D-3 PO), aspirin EC, finasteride, B-complex with vitamin C, losartan, metoprolol succinate, nitroGLYCERIN, simvastatin, isosorbide mononitrate, neomycin-colistin-hydrocortisone-thonzonium, acetic acid-hydrocortisone, and allopurinol.  No orders of the defined types were placed in this encounter.  I discussed the assessment and treatment plan with the patient. The patient was provided an opportunity to ask questions and all were answered. The patient agreed with the plan and demonstrated an understanding of the instructions.   The patient was advised to call back or seek an in-person evaluation if the symptoms worsen or if the condition fails to improve as anticipated.  I provided 10 minutes of non-face-to-face time during this encounter.   Penni Homans, MD

## 2019-04-28 NOTE — Assessment & Plan Note (Signed)
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 He is referred to the office for a Tdap due to his last shor 2014 and evaluation of if the wound needs further management

## 2019-04-28 NOTE — Assessment & Plan Note (Signed)
no changes to meds. Encouraged heart healthy diet such as the DASH diet and exercise as tolerated.  

## 2019-04-28 NOTE — Assessment & Plan Note (Signed)
Mild macrocytic, start vitamin B complex

## 2019-05-05 ENCOUNTER — Encounter: Payer: Self-pay | Admitting: Family Medicine

## 2019-05-06 ENCOUNTER — Encounter: Payer: Self-pay | Admitting: Family Medicine

## 2019-05-15 ENCOUNTER — Ambulatory Visit: Payer: Medicare Other | Admitting: Cardiology

## 2019-05-15 DIAGNOSIS — M961 Postlaminectomy syndrome, not elsewhere classified: Secondary | ICD-10-CM | POA: Diagnosis not present

## 2019-05-15 DIAGNOSIS — M5136 Other intervertebral disc degeneration, lumbar region: Secondary | ICD-10-CM | POA: Diagnosis not present

## 2019-05-16 DIAGNOSIS — H5213 Myopia, bilateral: Secondary | ICD-10-CM | POA: Diagnosis not present

## 2019-05-16 DIAGNOSIS — H524 Presbyopia: Secondary | ICD-10-CM | POA: Diagnosis not present

## 2019-05-16 DIAGNOSIS — H26493 Other secondary cataract, bilateral: Secondary | ICD-10-CM | POA: Diagnosis not present

## 2019-05-16 DIAGNOSIS — H35033 Hypertensive retinopathy, bilateral: Secondary | ICD-10-CM | POA: Diagnosis not present

## 2019-05-16 DIAGNOSIS — H52223 Regular astigmatism, bilateral: Secondary | ICD-10-CM | POA: Diagnosis not present

## 2019-05-26 ENCOUNTER — Emergency Department (HOSPITAL_BASED_OUTPATIENT_CLINIC_OR_DEPARTMENT_OTHER): Payer: Medicare Other

## 2019-05-26 ENCOUNTER — Encounter (HOSPITAL_BASED_OUTPATIENT_CLINIC_OR_DEPARTMENT_OTHER): Payer: Self-pay | Admitting: Emergency Medicine

## 2019-05-26 ENCOUNTER — Emergency Department (HOSPITAL_BASED_OUTPATIENT_CLINIC_OR_DEPARTMENT_OTHER)
Admission: EM | Admit: 2019-05-26 | Discharge: 2019-05-26 | Disposition: A | Discharge status: Home / Self Care | Payer: Medicare Other | Attending: Emergency Medicine | Admitting: Emergency Medicine

## 2019-05-26 ENCOUNTER — Other Ambulatory Visit: Payer: Self-pay

## 2019-05-26 DIAGNOSIS — Z7982 Long term (current) use of aspirin: Secondary | ICD-10-CM | POA: Insufficient documentation

## 2019-05-26 DIAGNOSIS — I5022 Chronic systolic (congestive) heart failure: Secondary | ICD-10-CM | POA: Diagnosis not present

## 2019-05-26 DIAGNOSIS — I110 Hypertensive heart disease with heart failure: Secondary | ICD-10-CM | POA: Insufficient documentation

## 2019-05-26 DIAGNOSIS — Z87891 Personal history of nicotine dependence: Secondary | ICD-10-CM | POA: Diagnosis not present

## 2019-05-26 DIAGNOSIS — I2510 Atherosclerotic heart disease of native coronary artery without angina pectoris: Secondary | ICD-10-CM | POA: Insufficient documentation

## 2019-05-26 DIAGNOSIS — Z79899 Other long term (current) drug therapy: Secondary | ICD-10-CM | POA: Diagnosis not present

## 2019-05-26 DIAGNOSIS — R42 Dizziness and giddiness: Secondary | ICD-10-CM | POA: Diagnosis not present

## 2019-05-26 DIAGNOSIS — Z955 Presence of coronary angioplasty implant and graft: Secondary | ICD-10-CM | POA: Insufficient documentation

## 2019-05-26 LAB — CBC
HCT: 40.4 % (ref 39.0–52.0)
Hemoglobin: 13.4 g/dL (ref 13.0–17.0)
MCH: 32.4 pg (ref 26.0–34.0)
MCHC: 33.2 g/dL (ref 30.0–36.0)
MCV: 97.8 fL (ref 80.0–100.0)
Platelets: 184 10*3/uL (ref 150–400)
RBC: 4.13 MIL/uL — ABNORMAL LOW (ref 4.22–5.81)
RDW: 14.4 % (ref 11.5–15.5)
WBC: 7.3 10*3/uL (ref 4.0–10.5)
nRBC: 0 % (ref 0.0–0.2)

## 2019-05-26 LAB — BASIC METABOLIC PANEL
Anion gap: 10 (ref 5–15)
BUN: 34 mg/dL — ABNORMAL HIGH (ref 8–23)
CO2: 22 mmol/L (ref 22–32)
Calcium: 9.1 mg/dL (ref 8.9–10.3)
Chloride: 103 mmol/L (ref 98–111)
Creatinine, Ser: 1.13 mg/dL (ref 0.61–1.24)
GFR calc Af Amer: >60 mL/min (ref >60)
GFR calc non Af Amer: 56 mL/min — ABNORMAL LOW (ref >60)
Glucose, Bld: 111 mg/dL — ABNORMAL HIGH (ref 70–99)
Potassium: 4.3 mmol/L (ref 3.5–5.1)
Sodium: 135 mmol/L (ref 135–145)

## 2019-05-26 LAB — TROPONIN I (HIGH SENSITIVITY): Troponin I (High Sensitivity): 14 ng/L (ref <18)

## 2019-05-26 MED ORDER — MECLIZINE HCL 25 MG PO TABS: 25.0000 mg | ORAL_TABLET | Freq: Three times a day (TID) | ORAL | 0 refills | Status: DC | PRN

## 2019-05-26 MED ORDER — MECLIZINE HCL 25 MG PO TABS
25.0000 mg | ORAL_TABLET | Freq: Once | ORAL | Status: AC
  Administered 2019-05-26: 25 mg via ORAL
  Filled 2019-05-26: qty 1

## 2019-05-26 MED ORDER — SODIUM CHLORIDE 0.9 % IV BOLUS
500.0000 mL | Freq: Once | INTRAVENOUS | Status: AC
  Administered 2019-05-26: 500 mL via INTRAVENOUS

## 2019-05-26 NOTE — ED Provider Notes (Signed)
[facility] EMERGENCY DEPARTMENT Provider Note   CSN: [id number] Arrival date & time: 05/26/19  0840     History   Chief Complaint Chief Complaint  Patient presents with  . Emesis  . Dizziness    HPI TAMOTSU WIEDERHOLT is a 85 y.o. male.     Patient is a 85 year old male with past medical history of coronary artery disease with CABG, hypertension, gout, peripheral vascular disease.  He presents today for evaluation of dizziness.  He states he got up from sleep this morning and attempted to walk to the bathroom.  As he was ambulating, he describes the acute onset of severe dizziness he describes as a spinning sensation.  Patient felt as though he was spinning and was off balance.  He was able to make his way back to the bed where he fell forward onto it, but was uninjured.  This improved somewhat while resting, then recurred when he attempted to ambulate again.  This time it was associated with episodes of vomiting.  Patient denies any headache or weakness.  He does describe issues with his hearing recently.  He has seen audiologist and had his ears irrigated, however he describes ongoing crackling.  The history is provided by the patient.  Emesis Severity:  Severe Duration:  2 hours Timing:  Intermittent Progression:  Improving Chronicity:  New Recent urination:  Normal Context: not post-tussive   Relieved by:  Nothing Worsened by:  Nothing Ineffective treatments:  None tried   Past Medical History:  Diagnosis Date  . Abdominal aortic aneurysm ([place])    a. [place] (1/14):  3.3 x 3.4 cm => f/u 11/2013  . Amebic dysentery   . AMEBIC DYSENTERY 11/19/2007   Qualifier: History of  By: Lenna Gilford MD, Deborra Medina   . Anemia 03/07/2017  . Anxiety   . ANXIETY 11/19/2007   Qualifier: Diagnosis of  By: Lenna Gilford MD, Deborra Medina   . Arthritis 03/07/2017  . Atherosclerosis of coronary artery bypass graft with unstable angina pectoris ([place]) 04/19/2013  . BACK PAIN, LUMBAR 11/16/2007   Qualifier:  Diagnosis of  By: Julien Girt CMA, Leigh    . Benign prostatic hypertrophy   . BENIGN PROSTATIC HYPERTROPHY, HX OF 11/16/2007   Qualifier: Diagnosis of  By: Julien Girt CMA, Leigh    . BRBPR (bright red blood per rectum) 11/01/2016  . CAD (coronary artery disease)    a. s/p CABG in 1979 and 1993;  b. LHC (5/14):  LM, LAD, CFX and RCA occluded; L-LAD ok, dLAD occluded after insertion of LIMA, S-OM occluded, S-PDA/AM 80-90 => PCI with Promus DES; EF 25%  . Cardiomyopathy, ischemic 06/05/2013  . Cerumen impaction    Bilateral  . CERUMEN IMPACTION, BILATERAL 02/03/2010   Qualifier: History of  By: Lenna Gilford MD, Deborra Medina   . Chicken pox as a child  . Chronic systolic CHF (congestive heart failure) ([place])   . COLONIC POLYPS 07/01/2008   Qualifier: Diagnosis of  By: Lenna Gilford MD, Deborra Medina   . Degenerative joint disease   . DEGENERATIVE JOINT DISEASE 11/16/2007   Qualifier: Diagnosis of  By: Julien Girt CMA, Marliss Czar    . Diverticulosis of colon   . DIVERTICULOSIS OF COLON 07/01/2008   Qualifier: Diagnosis of  By: Lenna Gilford MD, Deborra Medina   . Double vision 03/07/2017  . Essential hypertension 11/16/2007   Qualifier: Diagnosis of  By: Julien Girt CMA, Marliss Czar    . Fingernail abnormalities 03/07/2017  . FLANK PAIN, RIGHT 02/03/2010   Qualifier: History of  By:  Lenna Gilford MD, [place] GERD 11/16/2007   Qualifier: Diagnosis of  By: Julien Girt CMA, Marliss Czar    . GERD (gastroesophageal reflux disease)   . Gout   . GOUT 11/16/2007   Qualifier: Diagnosis of  By: Julien Girt CMA, Marliss Czar    . Hearing loss 11/24/2014  . Heart murmur   . History of shingles 10/27/2017  . Hypercholesterolemia   . HYPERCHOLESTEROLEMIA 11/16/2007   Qualifier: Diagnosis of  By: Julien Girt CMA, Marliss Czar    . Hypertension   . Ischemic cardiomyopathy    a. echo (09/05/13): EF 35%, diffuse HK worsened distal septal, mid/distal inferior and apical region, grade 1 diastolic dysfunction, mild LAE.    [name] Kidney stone 08/17/2011  . Loss of hearing   . Lumbar back pain   . Measles as a child  .  Medicare annual wellness visit, subsequent 11/24/2014   Sees Dr Delman Cheadle for dermatology Sees Dr Roni Bread of Urology Sees Dr Stanford Breed of cardiology Sees Dr Virginia Rochester of Opthamology No further colonoscopies warranted       . Mumps as a child  . Nephrolithiasis   . Pain in joint, lower leg 07/29/2014  . PERIPHERAL VASCULAR DISEASE 11/16/2007   Qualifier: Diagnosis of  By: Julien Girt CMA, Marliss Czar    . Peripheral vascular disease ([place])   . Rectal bleeding 03/07/2017  . Shingles 07/29/2014  . Sun-damaged skin 05/31/2014    Patient Active Problem List   Diagnosis Date Noted  . Finger laceration 04/28/2019  . Leg swelling 03/09/2019  . Hyperglycemia 01/22/2019  . Otitis externa 01/22/2019  . Edema 12/17/2018  . Right hip pain 12/17/2018  . Scrotal bleeding 03/01/2018  . Foot pain, right 03/01/2018  . Hemorrhoid 10/29/2017  . History of shingles 10/27/2017  . Anemia 03/07/2017  . Double vision 03/07/2017  . Arthritis 03/07/2017  . Medicare annual wellness visit, subsequent 11/24/2014  . Hearing loss 11/24/2014  . Pain in joint, lower leg 07/29/2014  . Sun-damaged skin 05/31/2014  . Chicken pox   . Measles   . Mumps   . Cardiomyopathy, ischemic 06/05/2013  . Atherosclerosis of coronary artery bypass graft with unstable angina pectoris ([place]) 04/19/2013  . Kidney stone 08/17/2011  . CAD (coronary artery disease)   . CERUMEN IMPACTION, BILATERAL 02/03/2010  . FLANK PAIN, RIGHT 02/03/2010  . Abdominal aortic aneurysm ([place]) 11/04/2009  . COLONIC POLYPS 07/01/2008  . Anxiety state 11/19/2007  . HYPERCHOLESTEROLEMIA 11/16/2007  . Gout 11/16/2007  . Essential hypertension 11/16/2007  . PERIPHERAL VASCULAR DISEASE 11/16/2007  . GERD 11/16/2007  . Osteoarthritis 11/16/2007  . BACK PAIN, LUMBAR 11/16/2007  . BENIGN PROSTATIC HYPERTROPHY, HX OF 11/16/2007    Past Surgical History:  Procedure Laterality Date  . CORONARY ANGIOPLASTY WITH STENT PLACEMENT  04/18/2013   RCA       . CORONARY ARTERY BYPASS  GRAFT  1979   x4 SVG-DIAG-LAD, SVG-OM-PDA  . CORONARY ARTERY BYPASS GRAFT  1993   Redo x5 by Dr Harlow Asa; [facility], SVG-OM, SVG-AM-PL  . Decompressive laminectomy  01/2006   L2 - scarum by Dr. Shellia Carwin  . HEMORRHOID SURGERY     fissure with hemorrhoid corrected at age 60  . INGUINAL HERNIA REPAIR  1994   Right by Dr Harlow Asa  . INGUINAL HERNIA REPAIR  1996   Left by Dr. Harlow Asa  . LEFT HEART CATHETERIZATION WITH CORONARY ANGIOGRAM N/A 09/23/2013   Procedure: LEFT HEART CATHETERIZATION WITH CORONARY ANGIOGRAM;  Surgeon: Blane Ohara, MD;  [facility];  Service: Cardiovascular;  Laterality: N/A;  . PERCUTANEOUS CORONARY STENT INTERVENTION (PCI-S) N/A 04/18/2013   Procedure: PERCUTANEOUS CORONARY STENT INTERVENTION (PCI-S);  Surgeon: Sherren Mocha, MD;  Location: [facility] CATH LAB;  Service: Cardiovascular;  Laterality: N/A;  . TONSILLECTOMY          Home Medications    Prior to Admission medications   Medication Sig Start Date End Date Taking? Authorizing Provider  acetic acid-hydrocortisone (VOSOL-HC) OTIC solution Place 4 drops into the left ear 3 (three) times daily. 01/22/19   [name], MD  allopurinol (ZYLOPRIM) 300 MG tablet Take 1 tablet by mouth once daily 02/25/19   [name], MD  aspirin EC 81 MG tablet Take 81 mg by mouth every morning.     [provider]  B Complex-C (B-COMPLEX WITH VITAMIN C) tablet Take 1 tablet by mouth daily.    [provider]  Cholecalciferol (VITAMIN D-3 PO) Take 5,000 Units by mouth daily with breakfast.     [provider]  finasteride (PROSCAR) 5 MG tablet Takes every third day    [provider]  folic acid (FOLVITE) 053 MCG tablet Take 400 mcg by mouth 2 (two) times daily.     [provider]  isosorbide mononitrate (IMDUR) 30 MG 24 hr tablet Take 3 tablets (90 mg total) by mouth daily. 12/11/18   [name], MD  losartan (COZAAR) 50 MG tablet Take 1 tablet (50 mg total) by  mouth daily. 11/27/18   Lelon Perla, MD  metoprolol succinate (TOPROL-XL) 25 MG 24 hr tablet TAKE 1/2 TABLET(12.5 MG) BY MOUTH DAILY 11/27/18   Lelon Perla, MD  Misc Natural Products (OSTEO BI-FLEX ADV JOINT SHIELD) TABS Take 1 tablet by mouth 2 (two) times daily.     [provider]  neomycin-colistin-hydrocortisone-thonzonium (CORTISPORIN-TC) 3.01-28-09-0.5 MG/ML OTIC suspension Place 4 drops into the left ear 3 (three) times daily. 01/22/19   [name], MD  nitroGLYCERIN (NITROSTAT) 0.4 MG SL tablet Place 1 tablet (0.4 mg total) under the tongue every 5 (five) minutes as needed. For chest pain. 11/27/18   Lelon Perla, MD  simvastatin (ZOCOR) 40 MG tablet Take 1 tablet (40 mg total) by mouth every evening. 12/11/18   [name], MD    Family History Family History  Problem Relation Age of Onset  . Parkinsonism Brother   . Diabetes Maternal Grandmother   . Depression Daughter   . Other Son        4 stents  . Heart disease Son   . Diabetes Son        type 2    Social History Social History   Tobacco Use  . Smoking status: Former Smoker    Quit date: 11/28/1944    Years since quitting: 74.5  . Smokeless tobacco: Never Used  Substance Use Topics  . Alcohol use: Yes    Alcohol/week: 2.0 standard drinks    Types: 2 Standard drinks or equivalent per week    Comment: daily rum  or wine  . Drug use: No     Allergies   Lisinopril, Methocarbamol, Pregabalin, and Ramipril   Review of Systems Review of Systems  All other systems reviewed and are negative.    Physical Exam Updated Vital Signs BP (!) 133/98 (BP Location: Right Arm)   Pulse 67   Temp 97.8 F (36.6 C) (Oral)   Resp 18   Ht 6' (1.829 m)   Wt 67.6 kg   SpO2 98%  BMI 20.21 kg/m   Physical Exam Vitals signs and nursing note reviewed.  Constitutional:      General: He is not in acute distress.    Appearance: He is well-developed. He is not diaphoretic.  HENT:     Head:  Normocephalic and atraumatic.     Right Ear: Tympanic membrane and ear canal normal. There is no impacted cerumen.     Left Ear: Tympanic membrane and ear canal normal. There is no impacted cerumen.     Mouth/Throat:     Mouth: Mucous membranes are moist.  Eyes:     Extraocular Movements: Extraocular movements intact.     Pupils: Pupils are equal, round, and reactive to light.  Neck:     Musculoskeletal: Normal range of motion and neck supple.  Cardiovascular:     Rate and Rhythm: Normal rate and regular rhythm.     Heart sounds: No murmur. No friction rub.  Pulmonary:     Effort: Pulmonary effort is normal. No respiratory distress.     Breath sounds: Normal breath sounds. No wheezing or rales.  Abdominal:     General: Bowel sounds are normal. There is no distension.     Palpations: Abdomen is soft.     Tenderness: There is no abdominal tenderness.  Musculoskeletal: Normal range of motion.        General: No swelling.  Skin:    General: Skin is warm and dry.  Neurological:     General: No focal deficit present.     Mental Status: He is alert and oriented to person, place, and time.     Cranial Nerves: No cranial nerve deficit.     Sensory: No sensory deficit.     Motor: No weakness.     Coordination: Coordination normal.      ED Treatments / Results  Labs (all labs ordered are listed, but only abnormal results are displayed) Labs Reviewed  BASIC METABOLIC PANEL  CBC  URINALYSIS, ROUTINE W REFLEX MICROSCOPIC  TROPONIN I (HIGH SENSITIVITY)  TROPONIN I (HIGH SENSITIVITY)    EKG EKG Interpretation  Date/Time:  Sunday May 26 2019 09:00:25 EDT Ventricular Rate:  55 PR Interval:    QRS Duration: 173 QT Interval:  496 QTC Calculation: 475 R Axis:   83 Text Interpretation:  Sinus rhythm Left bundle branch block Anteroseptal infarct, possibly acute Confirmed by Veryl Speak [phone]) on 05/26/2019 9:17:54 AM   Radiology No results found.  Procedures Procedures  (including critical care time)  Medications Ordered in ED Medications  sodium chloride 0.9 % bolus 500 mL (has no administration in time range)  meclizine (ANTIVERT) tablet 25 mg (has no administration in time range)     Initial Impression / Assessment and Plan / ED Course  I have reviewed the triage vital signs and the nursing notes.  Pertinent labs & imaging results that were available during my care of the patient were reviewed by me and considered in my medical decision making (see chart for details).  Patient is a 85 year old male presenting with symptoms that are most consistent with a peripheral vertigo.  Patient describes the sudden onset of severe spinning sensation with associated nausea and vomiting.  His symptoms are better with meclizine and IV fluids.  Patient's work-up shows a normal head CT and laboratory studies which are essentially unremarkable.  At this point, I feel as though discharge is appropriate.  Patient will be given meclizine he can take at home and is to return as needed if  his symptoms worsen or change.  Final Clinical Impressions(s) / ED Diagnoses   Final diagnoses:  None    ED Discharge Orders    None       Veryl Speak, MD 05/26/19 1039

## 2019-05-26 NOTE — ED Notes (Signed)
Patient transported to CT 

## 2019-05-26 NOTE — Discharge Instructions (Addendum)
Begin taking meclizine as prescribed as needed for dizziness.  Return to the emergency department if you develop worsening symptoms, severe headache, chest pain, or other new and concerning symptoms.

## 2019-05-26 NOTE — ED Triage Notes (Signed)
Pt reports waking up with dizziness at 7am. He states he was so dizzy he fell onto the bed. He has been vomiting as well. He denies chest pain and SOB

## 2019-05-27 ENCOUNTER — Encounter: Payer: Self-pay | Admitting: Family Medicine

## 2019-05-27 ENCOUNTER — Encounter: Payer: Self-pay | Admitting: *Deleted

## 2019-05-27 ENCOUNTER — Ambulatory Visit (INDEPENDENT_AMBULATORY_CARE_PROVIDER_SITE_OTHER): Payer: Medicare Other | Admitting: Family Medicine

## 2019-05-27 VITALS — BP 110/62 | HR 69 | Temp 98.6°F | Ht 72.0 in | Wt 153.5 lb

## 2019-05-27 DIAGNOSIS — N644 Mastodynia: Secondary | ICD-10-CM

## 2019-05-27 NOTE — Patient Instructions (Addendum)
Someone will reach out to you regarding your mammogram. We will be in touch regarding your results.  If anything big changes, let me know.   Let us know if you need anything.

## 2019-05-27 NOTE — Progress Notes (Signed)
Chief Complaint  Patient presents with  . Breast Problem    left side, reoccurence    Subjective: Patient is a 85 y.o. male here for nipple pain.  L sided breast tenderness and swelling around 1 week ago. No drainage from nipple. No skin changes. Mammogram in 2008 showed gynecomastia. No personal hx of breast cancer. No famhx of breast cancer. NO new medications. No iinj or change in activity.  ROS: Skin: As noted in HPI  Past Medical History:  Diagnosis Date  . Abdominal aortic aneurysm ([place])    a. [place] (1/14):  3.3 x 3.4 cm => f/u 11/2013  . Amebic dysentery   . AMEBIC DYSENTERY 11/19/2007   Qualifier: History of  By: Lenna Gilford MD, Deborra Medina   . Anemia 03/07/2017  . ANXIETY 11/19/2007   Qualifier: Diagnosis of  By: Lenna Gilford MD, Deborra Medina   . Arthritis 03/07/2017  . Atherosclerosis of coronary artery bypass graft with unstable angina pectoris ([place]) 04/19/2013  . BACK PAIN, LUMBAR 11/16/2007   Qualifier: Diagnosis of  By: Julien Girt CMA, Leigh    . Benign prostatic hypertrophy   . BENIGN PROSTATIC HYPERTROPHY, HX OF 11/16/2007   Qualifier: Diagnosis of  By: Julien Girt CMA, Leigh    . BRBPR (bright red blood per rectum) 11/01/2016  . CAD (coronary artery disease)    a. s/p CABG in 1979 and 1993;  b. LHC (5/14):  LM, LAD, CFX and RCA occluded; L-LAD ok, dLAD occluded after insertion of LIMA, S-OM occluded, S-PDA/AM 80-90 => PCI with Promus DES; EF 25%  . Cardiomyopathy, ischemic 06/05/2013  . Cerumen impaction    Bilateral  . Chicken pox as a child  . Chronic systolic CHF (congestive heart failure) ([place])   . COLONIC POLYPS 07/01/2008   Qualifier: Diagnosis of  By: Lenna Gilford MD, Deborra Medina   . Degenerative joint disease   . DEGENERATIVE JOINT DISEASE 11/16/2007   Qualifier: Diagnosis of  By: Julien Girt CMA, Marliss Czar    . Diverticulosis of colon   . DIVERTICULOSIS OF COLON 07/01/2008   Qualifier: Diagnosis of  By: Lenna Gilford MD, Deborra Medina   . Double vision 03/07/2017  . Essential hypertension 11/16/2007   Qualifier:  Diagnosis of  By: Julien Girt CMA, Marliss Czar    . Fingernail abnormalities 03/07/2017  . FLANK PAIN, RIGHT 02/03/2010   Qualifier: History of  By: Lenna Gilford MD, Deborra Medina   . GERD (gastroesophageal reflux disease)   . GOUT 11/16/2007   Qualifier: Diagnosis of  By: Julien Girt CMA, Marliss Czar    . Hearing loss 11/24/2014  . Heart murmur   . History of shingles 10/27/2017  . Hypercholesterolemia   . HYPERCHOLESTEROLEMIA 11/16/2007   Qualifier: Diagnosis of  By: Julien Girt CMA, Marliss Czar    . Ischemic cardiomyopathy    a. echo (09/05/13): EF 35%, diffuse HK worsened distal septal, mid/distal inferior and apical region, grade 1 diastolic dysfunction, mild LAE.    [name] Kidney stone 08/17/2011  . Loss of hearing   . Lumbar back pain   . Measles as a child  . Medicare annual wellness visit, subsequent 11/24/2014   Sees Dr Delman Cheadle for dermatology Sees Dr Roni Bread of Urology Sees Dr Stanford Breed of cardiology Sees Dr Virginia Rochester of Opthamology No further colonoscopies warranted       . Mumps as a child  . Nephrolithiasis   . Pain in joint, lower leg 07/29/2014  . PERIPHERAL VASCULAR DISEASE 11/16/2007   Qualifier: Diagnosis of  By: Julien Girt CMA, Marliss Czar    . Peripheral vascular disease ([place])   .  Rectal bleeding 03/07/2017  . Shingles 07/29/2014  . Sun-damaged skin 05/31/2014    Objective: BP 110/62 (BP Location: Left Arm, Patient Position: Sitting, Cuff Size: Normal)   Pulse 69   Temp 98.6 F (37 C) (Oral)   Ht 6' (1.829 m)   Wt 153 lb 8 oz (69.6 kg)   SpO2 98%   BMI 20.82 kg/m  General: Awake, appears stated age Skin: No ext lesions, no d/c from nipple, no masses in axillae, +swelling and enlargement of tissue underlying to L nipple compared to R; no fluctuance, erythema, ecchymosis or excessive warmth Lungs: No accessory muscle use Psych: Age appropriate judgment and insight, normal affect and mood  Assessment and Plan: Nipple pain - Plan: MM Digital Diagnostic Unilat L, probably gynecomastia as previous issue on R was  F/u w reg PCP as  originally scheduled.  The patient voiced understanding and agreement to the plan.  [place], DO 05/27/19  11:39 AM

## 2019-05-29 ENCOUNTER — Telehealth: Payer: Self-pay | Admitting: Family Medicine

## 2019-05-29 NOTE — Telephone Encounter (Signed)
Patient called stating he got a call from a nurse in office telling him to up his B Complex-C (B-COMPLEX WITH VITAMIN C) tablet medication to one more pill. Could not find anything in chart regarding. Nurse or PCP please advise  Patient call back [phone]

## 2019-05-30 NOTE — Telephone Encounter (Signed)
Called patient left message for patient to call the office back   Nurse triage may handle   See lab results 04/26/19 about the B complex.

## 2019-06-04 ENCOUNTER — Telehealth: Payer: Self-pay

## 2019-06-04 ENCOUNTER — Other Ambulatory Visit: Payer: Self-pay | Admitting: Family Medicine

## 2019-06-04 DIAGNOSIS — N644 Mastodynia: Secondary | ICD-10-CM

## 2019-06-04 DIAGNOSIS — N6320 Unspecified lump in the left breast, unspecified quadrant: Secondary | ICD-10-CM

## 2019-06-04 NOTE — Telephone Encounter (Signed)
   Copied from [place] [id number]. Topic: General - Inquiry >> Jun 03, 2019  4:48 PM Daniel Reeves wrote: Reason for CRM: pt called in was checking on the status of the ultrasound that was was suppose to be sch'd for the lump in his nipple and the AAA exam that he is due for ?   Best number [phone]

## 2019-06-05 ENCOUNTER — Telehealth: Payer: Self-pay | Admitting: Family Medicine

## 2019-06-05 ENCOUNTER — Other Ambulatory Visit: Payer: Self-pay

## 2019-06-05 ENCOUNTER — Ambulatory Visit: Payer: Medicare Other

## 2019-06-05 ENCOUNTER — Ambulatory Visit
Admission: RE | Admit: 2019-06-05 | Discharge: 2019-06-05 | Disposition: A | Discharge status: Home / Self Care | Payer: Medicare Other | Source: Ambulatory Visit | Attending: Family Medicine | Admitting: Family Medicine

## 2019-06-05 DIAGNOSIS — R928 Other abnormal and inconclusive findings on diagnostic imaging of breast: Secondary | ICD-10-CM | POA: Diagnosis not present

## 2019-06-05 DIAGNOSIS — N6320 Unspecified lump in the left breast, unspecified quadrant: Secondary | ICD-10-CM

## 2019-06-05 DIAGNOSIS — N644 Mastodynia: Secondary | ICD-10-CM

## 2019-06-05 NOTE — Telephone Encounter (Signed)
Ervin w/[facility] [employer] [phone] or [phone] stated he has an order for a Glucose Meter for the patient.  He wants to know if Dr. Randel Pigg order that meter for the patient.  Please advise.

## 2019-06-07 ENCOUNTER — Encounter: Payer: Self-pay | Admitting: Family Medicine

## 2019-06-15 ENCOUNTER — Encounter: Payer: Self-pay | Admitting: Family Medicine

## 2019-06-24 ENCOUNTER — Other Ambulatory Visit: Payer: Self-pay | Admitting: Family Medicine

## 2019-06-26 ENCOUNTER — Other Ambulatory Visit: Payer: Self-pay | Admitting: *Deleted

## 2019-06-26 DIAGNOSIS — I714 Abdominal aortic aneurysm, without rupture, unspecified: Secondary | ICD-10-CM

## 2019-06-26 NOTE — Telephone Encounter (Signed)
Patient made aware.

## 2019-07-04 ENCOUNTER — Other Ambulatory Visit: Payer: Self-pay

## 2019-07-04 ENCOUNTER — Ambulatory Visit (HOSPITAL_BASED_OUTPATIENT_CLINIC_OR_DEPARTMENT_OTHER)
Admission: RE | Admit: 2019-07-04 | Discharge: 2019-07-04 | Disposition: A | Discharge status: Home / Self Care | Payer: Medicare Other | Source: Ambulatory Visit | Attending: Cardiology | Admitting: Cardiology

## 2019-07-04 DIAGNOSIS — I714 Abdominal aortic aneurysm, without rupture, unspecified: Secondary | ICD-10-CM

## 2019-07-04 NOTE — Progress Notes (Signed)
AAA doppler performed  07/04/19 Daniel Reeves

## 2019-07-05 ENCOUNTER — Other Ambulatory Visit: Payer: Self-pay | Admitting: *Deleted

## 2019-07-05 ENCOUNTER — Other Ambulatory Visit (HOSPITAL_BASED_OUTPATIENT_CLINIC_OR_DEPARTMENT_OTHER): Payer: Medicare Other

## 2019-07-05 DIAGNOSIS — I714 Abdominal aortic aneurysm, without rupture, unspecified: Secondary | ICD-10-CM

## 2019-08-08 ENCOUNTER — Telehealth: Payer: Self-pay | Admitting: General Practice

## 2019-08-08 NOTE — Telephone Encounter (Signed)
Last OV and labs with PCP were completed 04/26/19 please advise on follow up Copied from [place] [id number]. Topic: General - Other >> Jul 23, 2019  4:20 PM Sheran Luz wrote: Patient would like to know how long he has to wait to have another set of labs drawn, so they will be covered by [employer]. Advised patient he would likely need to contact insurance. Patient inquired if order for routine labs can be put in so he can be scheduled. >> Jul 30, 2019  3:52 PM [place], [place] D wrote: Pt stated he called on 07/23/19 regarding when he can have more labs done. Requesting a callback as soon as possible.

## 2019-08-08 NOTE — Telephone Encounter (Signed)
He can have a lab appt next week with uric acid, hgba1c, cmp, cbc, tsh and lipid panel for gout, hyperglycemia, HTN and hi cholesterol. Then a virtual visit with me the week after. Also if he wants a flu shot arrange it for the same day as labs.

## 2019-08-13 ENCOUNTER — Other Ambulatory Visit (INDEPENDENT_AMBULATORY_CARE_PROVIDER_SITE_OTHER): Payer: Medicare Other

## 2019-08-13 ENCOUNTER — Other Ambulatory Visit: Payer: Self-pay

## 2019-08-13 ENCOUNTER — Ambulatory Visit (INDEPENDENT_AMBULATORY_CARE_PROVIDER_SITE_OTHER): Payer: Medicare Other | Admitting: *Deleted

## 2019-08-13 DIAGNOSIS — E785 Hyperlipidemia, unspecified: Secondary | ICD-10-CM

## 2019-08-13 DIAGNOSIS — I10 Essential (primary) hypertension: Secondary | ICD-10-CM

## 2019-08-13 DIAGNOSIS — Z23 Encounter for immunization: Secondary | ICD-10-CM

## 2019-08-13 DIAGNOSIS — M109 Gout, unspecified: Secondary | ICD-10-CM

## 2019-08-13 DIAGNOSIS — R739 Hyperglycemia, unspecified: Secondary | ICD-10-CM

## 2019-08-13 LAB — COMPREHENSIVE METABOLIC PANEL
ALT: 20 U/L (ref 0–53)
AST: 25 U/L (ref 0–37)
Albumin: 4 g/dL (ref 3.5–5.2)
Alkaline Phosphatase: 55 U/L (ref 39–117)
BUN: 23 mg/dL (ref 6–23)
CO2: 25 mEq/L (ref 19–32)
Calcium: 9.4 mg/dL (ref 8.4–10.5)
Chloride: 106 mEq/L (ref 96–112)
Creatinine, Ser: 0.97 mg/dL (ref 0.40–1.50)
GFR: 72.17 mL/min (ref >60.00)
Glucose, Bld: 90 mg/dL (ref 70–99)
Potassium: 3.9 mEq/L (ref 3.5–5.1)
Sodium: 139 mEq/L (ref 135–145)
Total Bilirubin: 1.4 mg/dL — ABNORMAL HIGH (ref 0.2–1.2)
Total Protein: 6.2 g/dL (ref 6.0–8.3)

## 2019-08-13 LAB — HEMOGLOBIN A1C: Hgb A1c MFr Bld: 5.7 % (ref 4.6–6.5)

## 2019-08-13 LAB — LIPID PANEL
Cholesterol: 102 mg/dL (ref 0–200)
HDL: 40.3 mg/dL (ref >39.00)
LDL Cholesterol: 42 mg/dL (ref 0–99)
NonHDL: 61.87
Total CHOL/HDL Ratio: 3
Triglycerides: 100 mg/dL (ref 0.0–149.0)
VLDL: 20 mg/dL (ref 0.0–40.0)

## 2019-08-13 LAB — TSH: TSH: 2.32 u[IU]/mL (ref 0.35–4.50)

## 2019-08-13 LAB — CBC
HCT: 37.8 % — ABNORMAL LOW (ref 39.0–52.0)
Hemoglobin: 12.8 g/dL — ABNORMAL LOW (ref 13.0–17.0)
MCHC: 33.9 g/dL (ref 30.0–36.0)
MCV: 99.1 fl (ref 78.0–100.0)
Platelets: 218 10*3/uL (ref 150.0–400.0)
RBC: 3.81 Mil/uL — ABNORMAL LOW (ref 4.22–5.81)
RDW: 14.6 % (ref 11.5–15.5)
WBC: 8 10*3/uL (ref 4.0–10.5)

## 2019-08-13 LAB — URIC ACID: Uric Acid, Serum: 3.4 mg/dL — ABNORMAL LOW (ref 4.0–7.8)

## 2019-08-13 NOTE — Progress Notes (Signed)
Patient here for flu vaccine.  Vaccine given and patient tolerated well.

## 2019-08-13 NOTE — Telephone Encounter (Signed)
Add on request has been faxed to [facility].

## 2019-08-13 NOTE — Telephone Encounter (Signed)
Patient came in this morning.  Can we add uric acid?

## 2019-08-15 ENCOUNTER — Encounter: Payer: Self-pay | Admitting: Family Medicine

## 2019-08-16 DIAGNOSIS — I10 Essential (primary) hypertension: Secondary | ICD-10-CM

## 2019-08-16 DIAGNOSIS — K625 Hemorrhage of anus and rectum: Secondary | ICD-10-CM

## 2019-08-17 NOTE — Progress Notes (Signed)
HPI: FU CAD; s/p CABG in 1979 and 1993, ischemic CM, systolic CHF, AAA, HTN, HL. Patient underwent cardiac catheterization in May of 2014. The left main, LAD, circumflex and RCA were occluded. The LIMA to the LAD was patent and the distal LAD was occluded after the insertion. Saphenous vein graft to the obtuse marginal was occluded. Saphenous vein graft to the acute marginal and PDA had a high-grade lesion prior to insertion into the PDA of 80-90%. Ejection fraction was 25%. PCI: Promus Premier (3.5x12 mm) DES to the [facility].Repeat catheterization in October 2014 because of recurrent chest pain. The stent placed in the saphenous vein graft to the PDA had mild in-stent restenosis. Medical therapy recommended. Nuclear study 2/17 showed EF 35, inferolateral scar, no ischemia.Last echocardiogram February 2018 showed ejection fraction 123456, grade 1 diastolic dysfunction, mild to moderate aortic insufficiency, mild mitral regurgitation. Carotid Dopplers February 2018 showed less than 50% bilateral stenosis.Abdominal ultrasound 8/20 showed 3.5 cm abdominal aortic aneurysm; distal vessel not well visualized. Since he was last seen,patient denies dyspnea, chest pain, palpitations or syncope.  Current Outpatient Medications  Medication Sig Dispense Refill   acetic acid-hydrocortisone (VOSOL-HC) OTIC solution Place 4 drops into the left ear 3 (three) times daily. 10 mL 0   allopurinol (ZYLOPRIM) 300 MG tablet Take 1 tablet by mouth once daily 90 tablet 0   aspirin EC 81 MG tablet Take 81 mg by mouth every morning.      B Complex-C (B-COMPLEX WITH VITAMIN C) tablet Take 1 tablet by mouth daily.     Cholecalciferol (VITAMIN D-3 PO) Take 5,000 Units by mouth daily with breakfast.      finasteride (PROSCAR) 5 MG tablet Takes every third day     folic acid (FOLVITE) A999333 MCG tablet Take 400 mcg by mouth 2 (two) times daily.      isosorbide mononitrate (IMDUR) 30 MG 24 hr tablet Take 3 tablets (90 mg  total) by mouth daily. 270 tablet 3   losartan (COZAAR) 50 MG tablet Take 1 tablet (50 mg total) by mouth daily. 90 tablet 1   meclizine (ANTIVERT) 25 MG tablet Take 1 tablet (25 mg total) by mouth 3 (three) times daily as needed for dizziness. 30 tablet 0   metoprolol succinate (TOPROL-XL) 25 MG 24 hr tablet TAKE 1/2 TABLET(12.5 MG) BY MOUTH DAILY 45 tablet 3   Misc Natural Products (OSTEO BI-FLEX ADV JOINT SHIELD) TABS Take 1 tablet by mouth 2 (two) times daily.      neomycin-colistin-hydrocortisone-thonzonium (CORTISPORIN-TC) 3.01-28-09-0.5 MG/ML OTIC suspension Place 4 drops into the left ear 3 (three) times daily. 10 mL 0   nitroGLYCERIN (NITROSTAT) 0.4 MG SL tablet Place 1 tablet (0.4 mg total) under the tongue every 5 (five) minutes as needed. For chest pain. 25 tablet 6   simvastatin (ZOCOR) 40 MG tablet Take 1 tablet (40 mg total) by mouth every evening. 90 tablet 1   No current facility-administered medications for this visit.      Past Medical History:  Diagnosis Date   Abdominal aortic aneurysm ([place])    a. [place] (1/14):  3.3 x 3.4 cm => f/u 11/2013   Amebic dysentery    AMEBIC DYSENTERY 11/19/2007   Qualifier: History of  By: Lenna Gilford MD, Deborra Medina    Anemia 03/07/2017   ANXIETY 11/19/2007   Qualifier: Diagnosis of  By: Lenna Gilford MD, Deborra Medina    Arthritis 03/07/2017   Atherosclerosis of coronary artery bypass graft with unstable angina pectoris ([place]) 04/19/2013  BACK PAIN, LUMBAR 11/16/2007   Qualifier: Diagnosis of  By: Julien Girt CMA, Leigh     Benign prostatic hypertrophy    BENIGN PROSTATIC HYPERTROPHY, HX OF 11/16/2007   Qualifier: Diagnosis of  By: Julien Girt CMA, Leigh     BRBPR (bright red blood per rectum) 11/01/2016   CAD (coronary artery disease)    a. s/p CABG in 1979 and 1993;  b. LHC (5/14):  LM, LAD, CFX and RCA occluded; L-LAD ok, dLAD occluded after insertion of LIMA, S-OM occluded, S-PDA/AM 80-90 => PCI with Promus DES; EF 25%   Cardiomyopathy, ischemic  06/05/2013   Cerumen impaction    Bilateral   Chicken pox as a child   Chronic systolic CHF (congestive heart failure) ([place])    COLONIC POLYPS 07/01/2008   Qualifier: Diagnosis of  By: Lenna Gilford MD, Scott M    Degenerative joint disease    DEGENERATIVE JOINT DISEASE 11/16/2007   Qualifier: Diagnosis of  By: Julien Girt CMA, Leigh     Diverticulosis of colon    DIVERTICULOSIS OF COLON 07/01/2008   Qualifier: Diagnosis of  By: Lenna Gilford MD, Deborra Medina    Double vision 03/07/2017   Essential hypertension 11/16/2007   Qualifier: Diagnosis of  By: Julien Girt CMA, Leigh     Fingernail abnormalities 03/07/2017   FLANK PAIN, RIGHT 02/03/2010   Qualifier: History of  By: Lenna Gilford MD, Deborra Medina    GERD (gastroesophageal reflux disease)    GOUT 11/16/2007   Qualifier: Diagnosis of  By: Julien Girt CMA, Leigh     Hearing loss 11/24/2014   Heart murmur    History of shingles 10/27/2017   Hypercholesterolemia    HYPERCHOLESTEROLEMIA 11/16/2007   Qualifier: Diagnosis of  By: Julien Girt CMA, Leigh     Ischemic cardiomyopathy    a. echo (09/05/13): EF 35%, diffuse HK worsened distal septal, mid/distal inferior and apical region, grade 1 diastolic dysfunction, mild LAE.     Kidney stone 08/17/2011   Loss of hearing    Lumbar back pain    Measles as a child   Medicare annual wellness visit, subsequent 11/24/2014   Sees Dr Delman Cheadle for dermatology Sees Dr Roni Bread of Urology Sees Dr Stanford Breed of cardiology Sees Dr Virginia Rochester of Opthamology No further colonoscopies warranted        Mumps as a child   Nephrolithiasis    Pain in joint, lower leg 07/29/2014   PERIPHERAL VASCULAR DISEASE 11/16/2007   Qualifier: Diagnosis of  By: Julien Girt CMA, Leigh     Peripheral vascular disease ([place])    Rectal bleeding 03/07/2017   Shingles 07/29/2014   Sun-damaged skin 05/31/2014    Past Surgical History:  Procedure Laterality Date   CORONARY ANGIOPLASTY WITH STENT PLACEMENT  04/18/2013   RCA        CORONARY ARTERY BYPASS GRAFT  1979    x4 SVG-DIAG-LAD, SVG-OM-PDA   CORONARY ARTERY BYPASS GRAFT  1993   Redo x5 by Dr Harlow Asa; Durward Fortes, SVG-OM, SVG-AM-PL   Decompressive laminectomy  01/2006   L2 - scarum by Dr. Shellia Carwin   HEMORRHOID SURGERY     fissure with hemorrhoid corrected at age 81   [place]   Right by Dr Lonzo Cloud HERNIA REPAIR  1996   Left by Dr. Harlow Asa   LEFT HEART CATHETERIZATION WITH CORONARY ANGIOGRAM N/A 09/23/2013   Procedure: [place];  Surgeon: Blane Ohara, MD;  Location: [facility] CATH LAB;  Service: Cardiovascular;  Laterality: N/A;   PERCUTANEOUS CORONARY  STENT INTERVENTION (PCI-S) N/A 04/18/2013   Procedure: PERCUTANEOUS CORONARY STENT INTERVENTION (PCI-S);  Surgeon: Sherren Mocha, MD;  Location: [facility] CATH LAB;  Service: Cardiovascular;  Laterality: N/A;   TONSILLECTOMY      Social History   Socioeconomic History   Marital status: Widowed    Spouse name: [name] x 64 yrs   Number of children: Not on file   Years of education: Not on file   Highest education level: Not on file  Occupational History   Occupation: Retired - Former Editor, 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   Lifestyle   Physical activity    Days per week: Not on file    Minutes per session: Not on file   Stress: Not on file  Relationships   Social  connections    Talks on phone: Not on file    Gets together: Not on file    Attends religious service: Not on file    Active member of club or organization: Not on file    Attends meetings of clubs or organizations: Not on file    Relationship status: Not on file   Intimate partner violence    Fear of current or ex partner: Not on file    Emotionally abused: Not on file    Physically abused: Not on file    Forced sexual activity: Not on file  Other Topics Concern   Not on file  Social History Narrative   Married   7 children    Family History  Problem Relation Age of Onset   Parkinsonism Brother    Diabetes Maternal Grandmother    Depression Daughter    Other Son        4 stents   Heart disease Son    Diabetes Son        type 2    ROS: no fevers or chills, productive cough, hemoptysis, dysphasia, odynophagia, melena, hematochezia, dysuria, hematuria, rash, seizure activity, orthopnea, PND, pedal edema, claudication. Remaining systems are negative.  Physical Exam: Well-developed well-nourished in no acute distress.  Skin is warm and dry.  HEENT is normal.  Neck is supple.  Chest is clear to auscultation with normal expansion.  Cardiovascular exam is regular rate and rhythm.  Abdominal exam nontender or distended. No masses palpated. Extremities show no edema. neuro grossly intact   A/P  1 coronary artery disease-patient has not had recurrent chest pain.  Continue medical therapy with aspirin and statin.  2 abdominal aortic aneurysm-follow-up ultrasound August 2021.  3 hypertension-blood pressure controlled.  Continue present medications and follow.  4 hyperlipidemia-continue statin.  5 ischemic cardiomyopathy-continue ARB and beta-blocker.  Kirk Ruths, MD

## 2019-08-21 ENCOUNTER — Other Ambulatory Visit: Payer: Self-pay

## 2019-08-21 ENCOUNTER — Encounter: Payer: Self-pay | Admitting: Cardiology

## 2019-08-21 ENCOUNTER — Ambulatory Visit (INDEPENDENT_AMBULATORY_CARE_PROVIDER_SITE_OTHER): Payer: Medicare Other | Admitting: Cardiology

## 2019-08-21 VITALS — BP 120/52 | HR 56 | Ht 72.0 in | Wt 159.4 lb

## 2019-08-21 DIAGNOSIS — I714 Abdominal aortic aneurysm, without rupture, unspecified: Secondary | ICD-10-CM

## 2019-08-21 DIAGNOSIS — I255 Ischemic cardiomyopathy: Secondary | ICD-10-CM | POA: Diagnosis not present

## 2019-08-21 DIAGNOSIS — I2510 Atherosclerotic heart disease of native coronary artery without angina pectoris: Secondary | ICD-10-CM

## 2019-08-21 DIAGNOSIS — I10 Essential (primary) hypertension: Secondary | ICD-10-CM | POA: Diagnosis not present

## 2019-08-21 DIAGNOSIS — E7800 Pure hypercholesterolemia, unspecified: Secondary | ICD-10-CM | POA: Diagnosis not present

## 2019-08-21 NOTE — Patient Instructions (Signed)
Medication Instructions:  NO CHANGE If you need a refill on your cardiac medications before your next appointment, please call your pharmacy.   Lab work: If you have labs (blood work) drawn today and your tests are completely normal, you will receive your results only by: . MyChart Message (if you have MyChart) OR . A paper copy in the mail If you have any lab test that is abnormal or we need to change your treatment, we will call you to review the results.  Follow-Up: At [facility], you and your health needs are our priority.  As part of our continuing mission to provide you with exceptional heart care, we have created designated Provider Care Teams.  These Care Teams include your primary Cardiologist (physician) and Advanced Practice Providers (APPs -  Physician Assistants and Nurse Practitioners) who all work together to provide you with the care you need, when you need it. Your physician wants you to follow-up in: 6 MONTHS WITH DR CRENSHAW You will receive a reminder letter in the mail two months in advance. If you don't receive a letter, please call our office to schedule the follow-up appointment.      

## 2019-08-28 ENCOUNTER — Other Ambulatory Visit: Payer: Self-pay | Admitting: Cardiology

## 2019-09-02 ENCOUNTER — Telehealth: Payer: Self-pay | Admitting: *Deleted

## 2019-09-02 NOTE — Telephone Encounter (Signed)
Spoke with laura at [facility] lab and they have not received it yet.  It has been a delay at the post office and could take up to 3 weeks.  Patient notified of this and appointment schedule for recheck cbc for 09/13/19

## 2019-09-02 NOTE — Telephone Encounter (Signed)
Caller Name Tok Phone Number [phone] Patient Name N/A Patient DOB 1924/10/06 Call Type Message Only Information Provided Reason for Call Request for Lab/Test Results Initial Comment Caller states that he is wanting a call back with his stool sample results. Caller would like a return call back on Monday when the office reopens. States that he dropped off the sample last week. Additional Comment Office hours provided Call Closed By: Arminda Resides Transaction Date/Time: 08/31/2019 11:26:06 AM (ET)

## 2019-09-05 ENCOUNTER — Telehealth: Payer: Self-pay

## 2019-09-05 ENCOUNTER — Encounter: Payer: Self-pay | Admitting: Family Medicine

## 2019-09-05 NOTE — Telephone Encounter (Signed)
Called patient left message for patient to call the office back  Nurse triage may handle 

## 2019-09-05 NOTE — Telephone Encounter (Signed)
Copied from [place] [phone]. Topic: General - Inquiry >> Sep 04, 2019  4:53 PM Mathis Bud wrote: Reason for CRM: Patient states a medical supply place called him stating PCP sent over a back brace and knee brace prescription.  Patient declined both.  Patient states that he does not believe PCP called this in.  He would like confirmation if pcp did send this in.  Call back [phone]

## 2019-09-13 ENCOUNTER — Other Ambulatory Visit (INDEPENDENT_AMBULATORY_CARE_PROVIDER_SITE_OTHER): Payer: Medicare Other

## 2019-09-13 ENCOUNTER — Other Ambulatory Visit: Payer: Self-pay

## 2019-09-13 ENCOUNTER — Other Ambulatory Visit: Payer: Self-pay | Admitting: Emergency Medicine

## 2019-09-13 DIAGNOSIS — D531 Other megaloblastic anemias, not elsewhere classified: Secondary | ICD-10-CM

## 2019-09-13 DIAGNOSIS — I10 Essential (primary) hypertension: Secondary | ICD-10-CM | POA: Diagnosis not present

## 2019-09-13 LAB — CBC
HCT: 37.1 % — ABNORMAL LOW (ref 39.0–52.0)
Hemoglobin: 12.6 g/dL — ABNORMAL LOW (ref 13.0–17.0)
MCHC: 33.8 g/dL (ref 30.0–36.0)
MCV: 99.7 fl (ref 78.0–100.0)
Platelets: 216 10*3/uL (ref 150.0–400.0)
RBC: 3.72 Mil/uL — ABNORMAL LOW (ref 4.22–5.81)
RDW: 14.1 % (ref 11.5–15.5)
WBC: 7.5 10*3/uL (ref 4.0–10.5)

## 2019-09-16 ENCOUNTER — Other Ambulatory Visit: Payer: Medicare Other

## 2019-09-16 ENCOUNTER — Telehealth: Payer: Self-pay

## 2019-09-16 ENCOUNTER — Other Ambulatory Visit: Payer: Self-pay

## 2019-09-16 ENCOUNTER — Encounter: Payer: Self-pay | Admitting: Family Medicine

## 2019-09-16 ENCOUNTER — Other Ambulatory Visit (INDEPENDENT_AMBULATORY_CARE_PROVIDER_SITE_OTHER): Payer: Medicare Other

## 2019-09-16 ENCOUNTER — Other Ambulatory Visit: Payer: Self-pay | Admitting: Emergency Medicine

## 2019-09-16 ENCOUNTER — Ambulatory Visit (INDEPENDENT_AMBULATORY_CARE_PROVIDER_SITE_OTHER): Payer: Medicare Other | Admitting: Family Medicine

## 2019-09-16 VITALS — BP 120/68 | HR 68 | Temp 96.7°F | Ht 72.0 in | Wt 161.1 lb

## 2019-09-16 DIAGNOSIS — H938X3 Other specified disorders of ear, bilateral: Secondary | ICD-10-CM | POA: Diagnosis not present

## 2019-09-16 DIAGNOSIS — D649 Anemia, unspecified: Secondary | ICD-10-CM

## 2019-09-16 DIAGNOSIS — D531 Other megaloblastic anemias, not elsewhere classified: Secondary | ICD-10-CM | POA: Diagnosis not present

## 2019-09-16 DIAGNOSIS — L989 Disorder of the skin and subcutaneous tissue, unspecified: Secondary | ICD-10-CM | POA: Diagnosis not present

## 2019-09-16 DIAGNOSIS — E538 Deficiency of other specified B group vitamins: Secondary | ICD-10-CM

## 2019-09-16 DIAGNOSIS — H6983 Other specified disorders of Eustachian tube, bilateral: Secondary | ICD-10-CM

## 2019-09-16 LAB — VITAMIN B12: Vitamin B-12: 255 pg/mL (ref 211–911)

## 2019-09-16 MED ORDER — FLUTICASONE PROPIONATE 50 MCG/ACT NA SUSP: 2.0000 | Freq: Every day | NASAL | 2 refills | Status: DC

## 2019-09-16 NOTE — Patient Instructions (Addendum)
Send me a MyChart message regarding when/where you submitted your stool test. I will run it by our lab team to get to the bottom of this.  If you do not hear anything about your referral in the next 1-2 weeks, call our office and ask for an update.  Give [place] 2-3 business days to get the results of your labs back.   Keep the diet clean and stay active.  Let us know if you need anything.

## 2019-09-16 NOTE — Telephone Encounter (Signed)
Copied from [place] [phone]. Topic: General - Other >> Sep 16, 2019  4:12 PM Erick Blinks wrote: Reason for CRM: Stool sample mailed on September 13 2019 To address: [place]

## 2019-09-16 NOTE — Progress Notes (Signed)
Chief Complaint  Patient presents with  . Follow-up    Pt is here for bilateral, worse on the L, ear cracking. Duration: 6 months Progression: unchanged Associated symptoms: none Denies: sore throat, congestion, post nasal drip, coryza, sneezing and sinus pressure, bleeding, or discharge from ear Treatment to date: ear flush  Non-healing lesion under L eye for past year. No pain or itching. It does not drain. No new topicals.   Following up on anemia. Due for B12 visit today. He did adhere to some dietary recommendatoins.  Reports that he is turned in a stool sample 2 weeks ago in [place].  He would like to see the results of that before submitting another sample.  ROS:  HEENT: +ear pain Costitutional: Denies fevers  Past Medical History:  Diagnosis Date  . Abdominal aortic aneurysm ([place])    a. [place] (1/14):  3.3 x 3.4 cm => f/u 11/2013  . Amebic dysentery   . AMEBIC DYSENTERY 11/19/2007   Qualifier: History of  By: Lenna Gilford MD, Deborra Medina   . Anemia 03/07/2017  . ANXIETY 11/19/2007   Qualifier: Diagnosis of  By: Lenna Gilford MD, Deborra Medina   . Arthritis 03/07/2017  . Atherosclerosis of coronary artery bypass graft with unstable angina pectoris ([place]) 04/19/2013  . BACK PAIN, LUMBAR 11/16/2007   Qualifier: Diagnosis of  By: Julien Girt CMA, Leigh    . Benign prostatic hypertrophy   . BENIGN PROSTATIC HYPERTROPHY, HX OF 11/16/2007   Qualifier: Diagnosis of  By: Julien Girt CMA, Leigh    . BRBPR (bright red blood per rectum) 11/01/2016  . CAD (coronary artery disease)    a. s/p CABG in 1979 and 1993;  b. LHC (5/14):  LM, LAD, CFX and RCA occluded; L-LAD ok, dLAD occluded after insertion of LIMA, S-OM occluded, S-PDA/AM 80-90 => PCI with Promus DES; EF 25%  . Cardiomyopathy, ischemic 06/05/2013  . Cerumen impaction    Bilateral  . Chicken pox as a child  . Chronic systolic CHF (congestive heart failure) ([place])   . COLONIC POLYPS 07/01/2008   Qualifier: Diagnosis of  By: Lenna Gilford MD, Deborra Medina   . Degenerative  joint disease   . DEGENERATIVE JOINT DISEASE 11/16/2007   Qualifier: Diagnosis of  By: Julien Girt CMA, Marliss Czar    . Diverticulosis of colon   . DIVERTICULOSIS OF COLON 07/01/2008   Qualifier: Diagnosis of  By: Lenna Gilford MD, Deborra Medina   . Double vision 03/07/2017  . Essential hypertension 11/16/2007   Qualifier: Diagnosis of  By: Julien Girt CMA, Marliss Czar    . Fingernail abnormalities 03/07/2017  . FLANK PAIN, RIGHT 02/03/2010   Qualifier: History of  By: Lenna Gilford MD, Deborra Medina   . GERD (gastroesophageal reflux disease)   . GOUT 11/16/2007   Qualifier: Diagnosis of  By: Julien Girt CMA, Marliss Czar    . Hearing loss 11/24/2014  . Heart murmur   . History of shingles 10/27/2017  . Hypercholesterolemia   . HYPERCHOLESTEROLEMIA 11/16/2007   Qualifier: Diagnosis of  By: Julien Girt CMA, Marliss Czar    . Ischemic cardiomyopathy    a. echo (09/05/13): EF 35%, diffuse HK worsened distal septal, mid/distal inferior and apical region, grade 1 diastolic dysfunction, mild LAE.    [name] Kidney stone 08/17/2011  . Loss of hearing   . Lumbar back pain   . Measles as a child  . Medicare annual wellness visit, subsequent 11/24/2014   Sees Dr Delman Cheadle for dermatology Sees Dr Roni Bread of Urology Sees Dr Stanford Breed of cardiology Sees Dr Virginia Rochester of Opthamology No further  colonoscopies warranted       . Mumps as a child  . Nephrolithiasis   . Pain in joint, lower leg 07/29/2014  . PERIPHERAL VASCULAR DISEASE 11/16/2007   Qualifier: Diagnosis of  By: Julien Girt CMA, Marliss Czar    . Peripheral vascular disease ([place])   . Rectal bleeding 03/07/2017  . Shingles 07/29/2014  . Sun-damaged skin 05/31/2014   BP 120/68 (BP Location: Left Arm, Patient Position: Sitting, Cuff Size: Normal)   Pulse 68   Temp (!) 96.7 F (35.9 C) (Temporal)   Ht 6' (1.829 m)   Wt 161 lb 2 oz (73.1 kg)   SpO2 98%   BMI 21.85 kg/m  General: Awake, alert, appearing stated age HEENT:  L ear- Canal patent without drainage or erythema, TM is neg R ear- canal patent without drainage or erythema, TM is neg  Nose- nares patent and without discharge Skin-see below Mouth- Lips, gums and dentition unremarkable, pharynx is without erythema or exudate Neck: No adenopathy Lungs: Normal effort, no accessory muscle use Psych: Age appropriate judgment and insight, normal mood and affect      Crackling sound in ear, bilateral - Plan: fluticasone (FLONASE) 50 MCG/ACT nasal spray  Normocytic anemia  Dysfunction of both eustachian tubes - Plan: fluticasone (FLONASE) 50 MCG/ACT nasal spray  Skin lesion - Plan: Ambulatory referral to Dermatology  1/3-start nasal spray. 2-we will check a B12 level while he is here today. 4-mildly concerned about a skin cancer given the duration and lack of improvement.  Due to the location, I would like him to see a specialist. F/u prn. Pt voiced understanding and agreement to the plan.  [place], DO 09/16/19 12:05 PM

## 2019-09-17 NOTE — Telephone Encounter (Signed)
Thanks

## 2019-09-18 ENCOUNTER — Encounter: Payer: Self-pay | Admitting: Family Medicine

## 2019-09-18 LAB — INTRINSIC FACTOR ANTIBODIES: Intrinsic Factor: POSITIVE — AB

## 2019-09-19 DIAGNOSIS — Z85828 Personal history of other malignant neoplasm of skin: Secondary | ICD-10-CM | POA: Diagnosis not present

## 2019-09-19 DIAGNOSIS — L570 Actinic keratosis: Secondary | ICD-10-CM | POA: Diagnosis not present

## 2019-09-19 DIAGNOSIS — Z08 Encounter for follow-up examination after completed treatment for malignant neoplasm: Secondary | ICD-10-CM | POA: Diagnosis not present

## 2019-09-20 ENCOUNTER — Other Ambulatory Visit: Payer: Self-pay

## 2019-09-20 ENCOUNTER — Ambulatory Visit (INDEPENDENT_AMBULATORY_CARE_PROVIDER_SITE_OTHER): Payer: Medicare Other

## 2019-09-20 DIAGNOSIS — E538 Deficiency of other specified B group vitamins: Secondary | ICD-10-CM | POA: Diagnosis not present

## 2019-09-20 MED ORDER — CYANOCOBALAMIN 1000 MCG/ML IJ SOLN
1000.0000 ug | Freq: Once | INTRAMUSCULAR | Status: AC
  Administered 2019-09-20: 1000 ug via INTRAMUSCULAR

## 2019-09-20 NOTE — Progress Notes (Signed)
Pt here for monthly B12 injection per Dr. Charlett Blake  B12 1046mcg given IM, and pt tolerated injection well.  Next B12 injection scheduled for two weeks.   Patient will have every other week injections for two months. Monthly after that.   Routed to DOD in absence of PCP.

## 2019-09-20 NOTE — Progress Notes (Signed)
Noted. Agree with above.  [place], DO 09/20/19 12:45 PM

## 2019-09-23 ENCOUNTER — Other Ambulatory Visit: Payer: Self-pay | Admitting: Emergency Medicine

## 2019-09-23 DIAGNOSIS — E538 Deficiency of other specified B group vitamins: Secondary | ICD-10-CM

## 2019-09-23 NOTE — Addendum Note (Signed)
Addended by: Caffie Pinto on: 09/23/2019 08:11 AM   Modules accepted: Orders

## 2019-09-29 ENCOUNTER — Other Ambulatory Visit: Payer: Self-pay | Admitting: Family Medicine

## 2019-10-04 ENCOUNTER — Other Ambulatory Visit: Payer: Self-pay

## 2019-10-04 ENCOUNTER — Ambulatory Visit (INDEPENDENT_AMBULATORY_CARE_PROVIDER_SITE_OTHER): Payer: Medicare Other | Admitting: *Deleted

## 2019-10-04 DIAGNOSIS — E538 Deficiency of other specified B group vitamins: Secondary | ICD-10-CM | POA: Diagnosis not present

## 2019-10-04 MED ORDER — CYANOCOBALAMIN 1000 MCG/ML IJ SOLN
1000.0000 ug | Freq: Once | INTRAMUSCULAR | Status: AC
  Administered 2019-10-04: 1000 ug via INTRAMUSCULAR

## 2019-10-04 NOTE — Progress Notes (Signed)
Pt here for 2nd biweekly  B12 injection per Dr. Charlett Blake  B12 1054mcg given IM, and pt tolerated injection well.  Patient will have every other week injections for two months. Monthly after that.   3rd and 4th biweekly b12 injection scheduled.

## 2019-10-05 ENCOUNTER — Other Ambulatory Visit: Payer: Self-pay | Admitting: Family Medicine

## 2019-10-06 ENCOUNTER — Other Ambulatory Visit: Payer: Self-pay | Admitting: Family Medicine

## 2019-10-06 DIAGNOSIS — E7800 Pure hypercholesterolemia, unspecified: Secondary | ICD-10-CM

## 2019-10-06 DIAGNOSIS — I10 Essential (primary) hypertension: Secondary | ICD-10-CM

## 2019-10-06 DIAGNOSIS — I25700 Atherosclerosis of coronary artery bypass graft(s), unspecified, with unstable angina pectoris: Secondary | ICD-10-CM

## 2019-10-07 MED ORDER — ALLOPURINOL 300 MG PO TABS: 300.0000 mg | ORAL_TABLET | Freq: Every day | ORAL | 0 refills | Status: DC

## 2019-10-07 NOTE — Addendum Note (Signed)
Addended by: Kem Boroughs D on: 10/07/2019 03:47 PM   Modules accepted: Orders

## 2019-10-18 ENCOUNTER — Ambulatory Visit (INDEPENDENT_AMBULATORY_CARE_PROVIDER_SITE_OTHER): Payer: Medicare Other | Admitting: *Deleted

## 2019-10-18 ENCOUNTER — Other Ambulatory Visit: Payer: Self-pay

## 2019-10-18 DIAGNOSIS — E538 Deficiency of other specified B group vitamins: Secondary | ICD-10-CM

## 2019-10-18 MED ORDER — CYANOCOBALAMIN 1000 MCG/ML IJ SOLN
1000.0000 ug | Freq: Once | INTRAMUSCULAR | Status: AC
  Administered 2019-10-18: 1000 ug via INTRAMUSCULAR

## 2019-10-18 NOTE — Progress Notes (Signed)
Pt here for 3rd biweekly  B12 injection perDr. Charlett Blake  B12 1069mcg given IM, and pt tolerated injection well.  Patient will have every other week injections for two months. Monthly after that.  4th biweekly b12 injection scheduled

## 2019-10-21 ENCOUNTER — Encounter: Payer: Self-pay | Admitting: Family Medicine

## 2019-10-21 DIAGNOSIS — R300 Dysuria: Secondary | ICD-10-CM

## 2019-10-21 NOTE — Telephone Encounter (Signed)
Called patient left message for patient to call the office back  Nurse triage may handle 

## 2019-10-22 ENCOUNTER — Other Ambulatory Visit (INDEPENDENT_AMBULATORY_CARE_PROVIDER_SITE_OTHER): Payer: Medicare Other

## 2019-10-22 ENCOUNTER — Other Ambulatory Visit: Payer: Self-pay

## 2019-10-22 ENCOUNTER — Other Ambulatory Visit: Payer: Medicare Other

## 2019-10-22 DIAGNOSIS — R300 Dysuria: Secondary | ICD-10-CM

## 2019-10-23 ENCOUNTER — Telehealth: Payer: Self-pay

## 2019-10-23 LAB — URINALYSIS
Bilirubin Urine: NEGATIVE
Hgb urine dipstick: NEGATIVE
Ketones, ur: NEGATIVE
Leukocytes,Ua: NEGATIVE
Nitrite: NEGATIVE
Specific Gravity, Urine: >=1.03 — AB (ref 1.000–1.030)
Total Protein, Urine: NEGATIVE
Urine Glucose: NEGATIVE
Urobilinogen, UA: 0.2 (ref 0.0–1.0)
pH: 5 (ref 5.0–8.0)

## 2019-10-23 NOTE — Telephone Encounter (Signed)
Copied from [place] [phone]. Topic: General - Inquiry >> Oct 23, 2019  2:13 PM Richardo Priest, [place] wrote: Reason for CRM: Pt called in stating he would like to have a stool sample order placed and would like to drop off at the [facility] lab. Please advise as patient is wanting to have it done today and drop off before 5:00.

## 2019-10-23 NOTE — Telephone Encounter (Signed)
There is an order for ifob from back in September.  If it is for that he should be ok to drop off.  Left detailed message on machine that he can drop of if that is what it is and to call us with any questions.

## 2019-10-24 LAB — URINE CULTURE
MICRO NUMBER:: 1134656
SPECIMEN QUALITY:: ADEQUATE

## 2019-10-27 NOTE — Telephone Encounter (Signed)
Check an make sure he is doing OK and see if there is anything else he needs

## 2019-10-28 ENCOUNTER — Other Ambulatory Visit (INDEPENDENT_AMBULATORY_CARE_PROVIDER_SITE_OTHER): Payer: Medicare Other

## 2019-10-28 ENCOUNTER — Ambulatory Visit: Payer: Medicare Other | Admitting: Family Medicine

## 2019-10-28 DIAGNOSIS — K625 Hemorrhage of anus and rectum: Secondary | ICD-10-CM

## 2019-10-29 ENCOUNTER — Encounter: Payer: Self-pay | Admitting: Family Medicine

## 2019-10-29 ENCOUNTER — Telehealth: Payer: Self-pay | Admitting: *Deleted

## 2019-10-29 LAB — FECAL OCCULT BLOOD, IMMUNOCHEMICAL: Fecal Occult Bld: POSITIVE — AB

## 2019-10-29 NOTE — Telephone Encounter (Signed)
Received call from Margarita Grizzle at [place] lab with positive IFOB result.

## 2019-10-29 NOTE — Telephone Encounter (Signed)
ifob positive see result note

## 2019-10-29 NOTE — Telephone Encounter (Signed)
Please advise 

## 2019-10-29 NOTE — Telephone Encounter (Signed)
Called patient left voicemail for patient to call the office back

## 2019-10-30 ENCOUNTER — Telehealth: Payer: Self-pay

## 2019-10-30 NOTE — Telephone Encounter (Signed)
Copied from [place] [phone]. Topic: General - Inquiry >> Oct 29, 2019  5:00 PM Alease Frame wrote: Reason for CRM: Patient is needing a call back from office . Please advise

## 2019-10-31 MED ORDER — FAMOTIDINE 20 MG PO TABS: 20.0000 mg | ORAL_TABLET | Freq: Two times a day (BID) | ORAL | 3 refills | Status: DC

## 2019-10-31 NOTE — Telephone Encounter (Signed)
See additional phone note from 10/29/19.

## 2019-11-01 ENCOUNTER — Ambulatory Visit (INDEPENDENT_AMBULATORY_CARE_PROVIDER_SITE_OTHER): Payer: Medicare Other

## 2019-11-01 ENCOUNTER — Other Ambulatory Visit: Payer: Self-pay

## 2019-11-01 DIAGNOSIS — E538 Deficiency of other specified B group vitamins: Secondary | ICD-10-CM | POA: Diagnosis not present

## 2019-11-01 MED ORDER — CYANOCOBALAMIN 1000 MCG/ML IJ SOLN
1000.0000 ug | Freq: Once | INTRAMUSCULAR | Status: AC
  Administered 2019-11-01: 1000 ug via INTRAMUSCULAR

## 2019-11-01 NOTE — Progress Notes (Signed)
Pt here for monthly B12 injection per Penni Homans  B12 1060mcg given IM, and pt tolerated injection well.  Next B12 injection scheduled for next month.

## 2019-11-01 NOTE — Progress Notes (Signed)
Noted. Agree with above.  [place], DO 11/01/19 12:29 PM

## 2019-11-05 ENCOUNTER — Telehealth: Payer: Self-pay

## 2019-11-05 NOTE — Telephone Encounter (Signed)
Copied from [place] [phone]. Topic: Quick Communication - See Telephone Encounter >> Nov 04, 2019  3:52 PM Loma [place] wrote: CRM for notification. See Telephone encounter for: 11/04/19.Pt wants Daniel Reeves to call him back if possible before the end of the day, apologizes for bothering  [phone]

## 2019-11-06 ENCOUNTER — Other Ambulatory Visit (INDEPENDENT_AMBULATORY_CARE_PROVIDER_SITE_OTHER): Payer: Medicare Other

## 2019-11-06 ENCOUNTER — Other Ambulatory Visit: Payer: Self-pay

## 2019-11-06 ENCOUNTER — Telehealth: Payer: Self-pay | Admitting: Internal Medicine

## 2019-11-06 DIAGNOSIS — K625 Hemorrhage of anus and rectum: Secondary | ICD-10-CM | POA: Diagnosis not present

## 2019-11-06 DIAGNOSIS — E538 Deficiency of other specified B group vitamins: Secondary | ICD-10-CM

## 2019-11-06 LAB — IRON: Iron: 107 ug/dL (ref 42–165)

## 2019-11-06 LAB — CBC
HCT: 38.9 % — ABNORMAL LOW (ref 39.0–52.0)
Hemoglobin: 13.1 g/dL (ref 13.0–17.0)
MCHC: 33.7 g/dL (ref 30.0–36.0)
MCV: 99.2 fl (ref 78.0–100.0)
Platelets: 206 10*3/uL (ref 150.0–400.0)
RBC: 3.92 Mil/uL — ABNORMAL LOW (ref 4.22–5.81)
RDW: 14.1 % (ref 11.5–15.5)
WBC: 8.1 10*3/uL (ref 4.0–10.5)

## 2019-11-06 LAB — VITAMIN B12: Vitamin B-12: 956 pg/mL — ABNORMAL HIGH (ref 211–911)

## 2019-11-06 NOTE — Telephone Encounter (Signed)
Notes recorded by [name], MD on 11/06/2019 at 2:13 PM EST   Anemia is better but vitamin B 12 is actually high, drop off of the supplementation. If you are still taking the Vitamin B complex twice a day drop to once a day  Pt called answering service 7:17 pm needed clarification on B12 dose Call pt please 11/07/19 to clarify instructions from the letter   Daniel Reeves

## 2019-11-07 ENCOUNTER — Encounter: Payer: Self-pay | Admitting: Family Medicine

## 2019-11-07 ENCOUNTER — Telehealth: Payer: Self-pay | Admitting: Family Medicine

## 2019-11-07 NOTE — Telephone Encounter (Signed)
Reason for CRM: pt called in and wanted to know if the nurse could call him back and tell him what "Anemia is better but vitamin B 12 is actually high, drop off of the supplementation" Meant that that was put in the notes?  [phone]

## 2019-11-07 NOTE — Telephone Encounter (Signed)
Spoke with patient about results, see my chart message

## 2019-11-07 NOTE — Telephone Encounter (Signed)
Please call patient and clarify how much vitamin B 12 he is taking and have him drop his dosing for the week in 1/2.

## 2019-11-08 ENCOUNTER — Ambulatory Visit: Payer: Medicare Other | Admitting: Family Medicine

## 2019-11-09 NOTE — Telephone Encounter (Signed)
Spoke with patient about labs 

## 2019-11-14 ENCOUNTER — Other Ambulatory Visit: Payer: Self-pay

## 2019-11-14 ENCOUNTER — Ambulatory Visit (INDEPENDENT_AMBULATORY_CARE_PROVIDER_SITE_OTHER): Payer: Medicare Other | Admitting: Family Medicine

## 2019-11-14 VITALS — Ht 72.0 in | Wt 161.0 lb

## 2019-11-14 DIAGNOSIS — I10 Essential (primary) hypertension: Secondary | ICD-10-CM | POA: Diagnosis not present

## 2019-11-14 DIAGNOSIS — D649 Anemia, unspecified: Secondary | ICD-10-CM

## 2019-11-14 DIAGNOSIS — K625 Hemorrhage of anus and rectum: Secondary | ICD-10-CM

## 2019-11-14 DIAGNOSIS — E7800 Pure hypercholesterolemia, unspecified: Secondary | ICD-10-CM | POA: Diagnosis not present

## 2019-11-14 DIAGNOSIS — R739 Hyperglycemia, unspecified: Secondary | ICD-10-CM

## 2019-11-14 DIAGNOSIS — K219 Gastro-esophageal reflux disease without esophagitis: Secondary | ICD-10-CM

## 2019-11-14 DIAGNOSIS — I255 Ischemic cardiomyopathy: Secondary | ICD-10-CM | POA: Diagnosis not present

## 2019-11-14 DIAGNOSIS — N400 Enlarged prostate without lower urinary tract symptoms: Secondary | ICD-10-CM

## 2019-11-14 NOTE — Assessment & Plan Note (Signed)
Monitor at home and report any concerns

## 2019-11-14 NOTE — Assessment & Plan Note (Signed)
Encouraged heart healthy diet, increase exercise, avoid trans fats, consider a krill oil cap daily 

## 2019-11-14 NOTE — Assessment & Plan Note (Signed)
hgba1c acceptable, minimize simple carbs.  

## 2019-11-14 NOTE — Assessment & Plan Note (Signed)
Very mild and improved with recheck

## 2019-11-15 ENCOUNTER — Telehealth: Payer: Self-pay | Admitting: Gastroenterology

## 2019-11-15 NOTE — Telephone Encounter (Signed)
That is OK with me

## 2019-11-15 NOTE — Telephone Encounter (Signed)
No problem, happy to see him in the [facility].

## 2019-11-15 NOTE — Telephone Encounter (Signed)
Bri see change to Dr Bryan Lemma.  Not sure if she needs an appt now.

## 2019-11-15 NOTE — Telephone Encounter (Signed)
Is this ok with you  ?

## 2019-11-15 NOTE — Telephone Encounter (Signed)
Attempted to reach patient-unable to leave VM as phone rang "busy" -will attempt to reach patient at a later date/time;

## 2019-11-17 NOTE — Progress Notes (Signed)
Virtual Visit via phone Note  I connected with Daniel Reeves on 11/14/19 at  3:20 PM EST by a phone enabled telemedicine application and verified that I am speaking with the correct person using two identifiers.  Location: Patient: home Provider: office   I discussed the limitations of evaluation and management by telemedicine and the availability of in person appointments. The patient expressed understanding and agreed to proceed. cma was able to get patient set up with visit     Subjective:    Patient ID: Daniel Reeves, male    DOB: 01-04-1924, 85 y.o.   MRN: [id number]  Chief Complaint  Patient presents with  . Follow-up    HPI Patient is in today for evaluation of multiple medical concerns including hypertension, anemia and rectal bleeding. He reports numerous loose stool some days and when this happens he gets rectal irritation and some bright red blood on the tissue is noting. No bleeding between bowel movements. No abdominal pain or loss of appetitie. Denies CP/palp/SOB/HA/congestion/fevers or GU c/o. Taking meds as prescribed  Past Medical History:  Diagnosis Date  . Abdominal aortic aneurysm ([place])    a. [place] (1/14):  3.3 x 3.4 cm => f/u 11/2013  . Amebic dysentery   . AMEBIC DYSENTERY 11/19/2007   Qualifier: History of  By: Lenna Gilford MD, Deborra Medina   . Anemia 03/07/2017  . ANXIETY 11/19/2007   Qualifier: Diagnosis of  By: Lenna Gilford MD, Deborra Medina   . Arthritis 03/07/2017  . 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 BACK PAIN, LUMBAR 11/16/2007   Qualifier: Diagnosis of  By: Julien Girt CMA, Leigh    . Benign prostatic hypertrophy   . BENIGN PROSTATIC HYPERTROPHY, HX OF 11/16/2007   Qualifier: Diagnosis of  By: Julien Girt CMA, Leigh    . BRBPR (bright red blood per rectum) 11/01/2016  . CAD (coronary artery disease)    a. s/p CABG in 1979 and 1993;  b. LHC (5/14):  LM, LAD, CFX and RCA occluded; L-LAD ok, dLAD occluded after  insertion of LIMA, S-OM occluded, S-PDA/AM 80-90 => PCI with Promus DES; EF 25%  . Cardiomyopathy, ischemic 06/05/2013  . Cerumen impaction    Bilateral  . Chicken pox as a child  . Chronic systolic CHF (congestive heart failure) ([place])   . COLONIC POLYPS 07/01/2008   Qualifier: Diagnosis of  By: Lenna Gilford MD, Deborra Medina   . Degenerative joint disease   . DEGENERATIVE JOINT DISEASE 11/16/2007   Qualifier: Diagnosis of  By: Julien Girt CMA, Marliss Czar    . Diverticulosis of colon   . DIVERTICULOSIS OF COLON 07/01/2008   Qualifier: Diagnosis of  By: Lenna Gilford MD, Deborra Medina   . Double vision 03/07/2017  . Essential hypertension 11/16/2007   Qualifier: Diagnosis of  By: Julien Girt CMA, Marliss Czar    . Fingernail abnormalities 03/07/2017  . FLANK PAIN, RIGHT 02/03/2010   Qualifier: History of  By: Lenna Gilford MD, Deborra Medina   . GERD (gastroesophageal reflux disease)   . GOUT 11/16/2007   Qualifier: Diagnosis of  By: Julien Girt CMA, Marliss Czar    . Hearing loss 11/24/2014  . Heart murmur   . History of shingles 10/27/2017  . Hypercholesterolemia   . HYPERCHOLESTEROLEMIA 11/16/2007   Qualifier: Diagnosis of  By: Julien Girt CMA, Marliss Czar    . Ischemic cardiomyopathy    a. echo (09/05/13): EF 35%, diffuse HK worsened distal septal, mid/distal inferior and apical region, grade 1 diastolic dysfunction, mild LAE.    [name]  Kidney stone 08/17/2011  . Loss of hearing   . Lumbar back pain   . Measles as a child  . Medicare annual wellness visit, subsequent 11/24/2014   Sees Dr Delman Cheadle for dermatology Sees Dr Roni Bread of Urology Sees Dr Stanford Breed of cardiology Sees Dr Virginia Rochester of Opthamology No further colonoscopies warranted       . Mumps as a child  . Nephrolithiasis   . Pain in joint, lower leg 07/29/2014  . PERIPHERAL VASCULAR DISEASE 11/16/2007   Qualifier: Diagnosis of  By: Julien Girt CMA, Marliss Czar    . Peripheral vascular disease ([place])   . Rectal bleeding 03/07/2017  . Shingles 07/29/2014  . Sun-damaged skin 05/31/2014    Past Surgical History:  Procedure Laterality Date    . CORONARY ANGIOPLASTY WITH STENT PLACEMENT  04/18/2013   RCA       . CORONARY ARTERY BYPASS GRAFT  1979   x4 SVG-DIAG-LAD, SVG-OM-PDA  . CORONARY ARTERY BYPASS GRAFT  1993   Redo x5 by Dr Harlow Asa; [facility], SVG-OM, SVG-AM-PL  . Decompressive laminectomy  01/2006   L2 - scarum by Dr. Shellia Carwin  . HEMORRHOID SURGERY     fissure with hemorrhoid corrected at age 9  . INGUINAL HERNIA REPAIR  1994   Right by Dr Harlow Asa  . INGUINAL HERNIA REPAIR  1996   Left by Dr. Harlow Asa  . LEFT HEART CATHETERIZATION WITH CORONARY ANGIOGRAM N/A 09/23/2013   Procedure: LEFT HEART CATHETERIZATION WITH CORONARY ANGIOGRAM;  Surgeon: Blane Ohara, MD;  Location: [facility] CATH LAB;  Service: Cardiovascular;  Laterality: N/A;  . PERCUTANEOUS CORONARY STENT INTERVENTION (PCI-S) N/A 04/18/2013   Procedure: PERCUTANEOUS CORONARY STENT INTERVENTION (PCI-S);  Surgeon: Sherren Mocha, MD;  Location: [facility] CATH LAB;  Service: Cardiovascular;  Laterality: N/A;  . TONSILLECTOMY      Family History  Problem Relation Age of Onset  . Parkinsonism Brother   . Diabetes Maternal Grandmother   . Depression Daughter   . Other Son        4 stents  . Heart disease Son   . Diabetes Son        type 2    Social History   Socioeconomic History  . Marital status: Widowed    Spouse name: [name] x 8 yrs  . Number of children: Not on file  . Years of education: Not on file  . Highest education level: Not on file  Occupational History  . 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 Smoking status: Former Smoker    Quit date: 11/28/1944    Years since quitting: 75.0  . Smokeless tobacco: Never Used  Substance and Sexual Activity  . Alcohol use: Yes    Alcohol/week: 2.0 standard drinks    Types: 2 Standard drinks or equivalent per week    Comment: daily rum  or wine  . Drug use: No  . Sexual activity: Not Currently    Comment: lives with wife, no dietary restrictions.   Other Topics Concern  .  Not on file  Social History Narrative   Married   7 children   Social Determinants of Health   Financial Resource Strain:   . Difficulty of Paying Living Expenses: Not on file  Food Insecurity:   . Worried About Charity fundraiser in the Last Year: Not on file  . Ran Out of Food in the Last Year: Not on file  Transportation Needs:   . Lack of Transportation (Medical): Not on  file  . Lack of Transportation (Non-Medical): Not on file  Physical Activity:   . Days of Exercise per Week: Not on file  . Minutes of Exercise per Session: Not on file  Stress:   . Feeling of Stress : Not on file  Social Connections:   . Frequency of Communication with Friends and Family: Not on file  . Frequency of Social Gatherings with Friends and Family: Not on file  . Attends Religious Services: Not on file  . Active Member of Clubs or Organizations: Not on file  . Attends Archivist Meetings: Not on file  . Marital Status: Not on file  Intimate Partner Violence:   . Fear of Current or Ex-Partner: Not on file  . Emotionally Abused: Not on file  . Physically Abused: Not on file  . Sexually Abused: Not on file    Outpatient Medications Prior to Visit  Medication Sig Dispense Refill  . allopurinol (ZYLOPRIM) 300 MG tablet Take 1 tablet (300 mg total) by mouth daily. 90 tablet 0  . aspirin EC 81 MG tablet Take 81 mg by mouth every morning.     . B Complex-C (B-COMPLEX WITH VITAMIN C) tablet Take 1 tablet by mouth 2 (two) times daily.    . Cholecalciferol (VITAMIN D-3 PO) Take 5,000 Units by mouth daily with breakfast.     . famotidine (PEPCID) 20 MG tablet Take 1 tablet (20 mg total) by mouth 2 (two) times daily. 90 tablet 3  . finasteride (PROSCAR) 5 MG tablet Takes every third day    . fluticasone (FLONASE) 50 MCG/ACT nasal spray Place 2 sprays into both nostrils daily. 16 g 2  . folic acid (FOLVITE) A999333 MCG tablet Take 400 mcg by mouth 2 (two) times daily.     . isosorbide mononitrate  (IMDUR) 30 MG 24 hr tablet Take 3 tablets (90 mg total) by mouth daily. 270 tablet 3  . losartan (COZAAR) 50 MG tablet TAKE 1 TABLET BY MOUTH ONCE DAILY .  MUST  KEEP  APPOINTMENT  FOR  REFILLS. 90 tablet 1  . metoprolol succinate (TOPROL-XL) 25 MG 24 hr tablet TAKE 1/2 TABLET(12.5 MG) BY MOUTH DAILY 45 tablet 3  . Misc Natural Products (OSTEO BI-FLEX ADV JOINT SHIELD) TABS Take 1 tablet by mouth 2 (two) times daily.     . nitroGLYCERIN (NITROSTAT) 0.4 MG SL tablet Place 1 tablet (0.4 mg total) under the tongue every 5 (five) minutes as needed. For chest pain. 25 tablet 6  . simvastatin (ZOCOR) 40 MG tablet TAKE 1 TABLET BY MOUTH ONCE DAILY IN THE EVENING 90 tablet 1  . meclizine (ANTIVERT) 25 MG tablet Take 1 tablet (25 mg total) by mouth 3 (three) times daily as needed for dizziness. 30 tablet 0   No facility-administered medications prior to visit.    Allergies  Allergen Reactions  . Lisinopril     REACTION: dizziness  . Methocarbamol     REACTION: pt states "dizzy"  . Other   . Pregabalin     REACTION: pt states "dizzy"  . Ramipril     REACTION: hives and dizziness    Review of Systems  Constitutional: Negative for fever and malaise/fatigue.  HENT: Negative for congestion.   Eyes: Negative for blurred vision.  Respiratory: Negative for shortness of breath.   Cardiovascular: Negative for chest pain, palpitations and leg swelling.  Gastrointestinal: Positive for blood in stool. Negative for abdominal pain and nausea.  Genitourinary: Negative for dysuria and frequency.  Musculoskeletal: Negative for falls.  Skin: Negative for rash.  Neurological: Negative for dizziness, loss of consciousness and headaches.  Endo/Heme/Allergies: Negative for environmental allergies.  Psychiatric/Behavioral: Negative for depression. The patient is not nervous/anxious.        Objective:    Physical Exam unable to obtain via pohone  Ht 6' (1.829 m)   Wt 161 lb (73 kg)   BMI 21.84 kg/m  Wt  Readings from Last 3 Encounters:  11/14/19 161 lb (73 kg)  09/16/19 161 lb 2 oz (73.1 kg)  08/21/19 159 lb 6.4 oz (72.3 kg)    Diabetic Foot Exam - Simple   No data filed     Lab Results  Component Value Date   WBC 8.1 11/06/2019   HGB 13.1 11/06/2019   HCT 38.9 (L) 11/06/2019   PLT 206.0 11/06/2019   GLUCOSE 90 08/13/2019   CHOL 102 08/13/2019   TRIG 100.0 08/13/2019   HDL 40.30 08/13/2019   LDLCALC 42 08/13/2019   ALT 20 08/13/2019   AST 25 08/13/2019   NA 139 08/13/2019   K 3.9 08/13/2019   CL 106 08/13/2019   CREATININE 0.97 08/13/2019   BUN 23 08/13/2019   CO2 25 08/13/2019   TSH 2.32 08/13/2019   PSA 4.29 (H) 02/08/2011   INR 1.09 02/04/2018   HGBA1C 5.7 08/13/2019    Lab Results  Component Value Date   TSH 2.32 08/13/2019   Lab Results  Component Value Date   WBC 8.1 11/06/2019   HGB 13.1 11/06/2019   HCT 38.9 (L) 11/06/2019   MCV 99.2 11/06/2019   PLT 206.0 11/06/2019   Lab Results  Component Value Date   NA 139 08/13/2019   K 3.9 08/13/2019   CO2 25 08/13/2019   GLUCOSE 90 08/13/2019   BUN 23 08/13/2019   CREATININE 0.97 08/13/2019   BILITOT 1.4 (H) 08/13/2019   ALKPHOS 55 08/13/2019   AST 25 08/13/2019   ALT 20 08/13/2019   PROT 6.2 08/13/2019   ALBUMIN 4.0 08/13/2019   CALCIUM 9.4 08/13/2019   ANIONGAP 10 05/26/2019   GFR 72.17 08/13/2019   Lab Results  Component Value Date   CHOL 102 08/13/2019   Lab Results  Component Value Date   HDL 40.30 08/13/2019   Lab Results  Component Value Date   LDLCALC 42 08/13/2019   Lab Results  Component Value Date   TRIG 100.0 08/13/2019   Lab Results  Component Value Date   CHOLHDL 3 08/13/2019   Lab Results  Component Value Date   HGBA1C 5.7 08/13/2019       Assessment & Plan:   Problem List Items Addressed This Visit    HYPERCHOLESTEROLEMIA    Encouraged heart healthy diet, increase exercise, avoid trans fats, consider a krill oil cap daily      Essential hypertension     Monitor at home and report any concerns      GERD    Avoid offending foods, start probiotics. Do not eat large meals in late evening and consider raising head of bed.       BPH (benign prostatic hyperplasia)    Continues to follow with Dr Roni Bread of urology      Anemia    Very mild and improved with recheck      Hyperglycemia    hgba1c acceptable, minimize simple carbs.        Other Visit Diagnoses    Rectal bleeding    -  Primary   Relevant Orders  Ambulatory referral to Gastroenterology      I am having Daniel Flakes. Lemon "Dick" maintain his folic acid, Osteo Bi-Flex Adv Joint Shield, Cholecalciferol (VITAMIN D-3 PO), aspirin EC, finasteride, metoprolol succinate, nitroGLYCERIN, isosorbide mononitrate, meclizine, losartan, B-complex with vitamin C, fluticasone, simvastatin, allopurinol, and famotidine.  No orders of the defined types were placed in this encounter.    I discussed the assessment and treatment plan with the patient. The patient was provided an opportunity to ask questions and all were answered. The patient agreed with the plan and demonstrated an understanding of the instructions.   The patient was advised to call back or seek an in-person evaluation if the symptoms worsen or if the condition fails to improve as anticipated.  I provided 25 minutes of non-face-to-face time during this encounter.   Penni Homans, MD

## 2019-11-17 NOTE — Assessment & Plan Note (Signed)
Avoid offending foods, start probiotics. Do not eat large meals in late evening and consider raising head of bed.  

## 2019-11-17 NOTE — Assessment & Plan Note (Signed)
Continues to follow with Dr Roni Bread of urology

## 2019-11-18 ENCOUNTER — Encounter: Payer: Self-pay | Admitting: Gastroenterology

## 2019-11-18 NOTE — Telephone Encounter (Signed)
Left message for patient to call back to the office;  

## 2019-11-18 NOTE — Telephone Encounter (Signed)
Patient returned call to the [facility] to schedule patient for an OV appt;

## 2019-11-25 ENCOUNTER — Other Ambulatory Visit: Payer: Self-pay

## 2019-11-25 ENCOUNTER — Other Ambulatory Visit (INDEPENDENT_AMBULATORY_CARE_PROVIDER_SITE_OTHER): Payer: Medicare Other

## 2019-11-25 ENCOUNTER — Telehealth: Payer: Self-pay | Admitting: Family Medicine

## 2019-11-25 DIAGNOSIS — E538 Deficiency of other specified B group vitamins: Secondary | ICD-10-CM

## 2019-11-25 LAB — VITAMIN B12: Vitamin B-12: 416 pg/mL (ref 211–911)

## 2019-11-25 NOTE — Telephone Encounter (Signed)
Tried to contact patient to cancel lab appointment.  No orders found for B12.  Unable to connect with patient.  If he calls back, please cancel until Dr Charlett Blake is able to authorize orders for B12 labs.    Last B12 lab was 11/06/2019.  Note from patient stating he will be skipping his B12 injection 12/03/2018 so I didn't think he would need lab draw until after.   Please advise.

## 2019-11-25 NOTE — Telephone Encounter (Signed)
Patient orders were placed, per patient request and approval from Maudie Mercury, [place].

## 2019-11-30 ENCOUNTER — Other Ambulatory Visit: Payer: Self-pay

## 2019-11-30 ENCOUNTER — Emergency Department (HOSPITAL_BASED_OUTPATIENT_CLINIC_OR_DEPARTMENT_OTHER): Payer: Medicare Other

## 2019-11-30 ENCOUNTER — Observation Stay (HOSPITAL_BASED_OUTPATIENT_CLINIC_OR_DEPARTMENT_OTHER)
Admission: EM | Admit: 2019-11-30 | Discharge: 2019-12-01 | Disposition: A | Discharge status: Home / Self Care | Payer: Medicare Other | Attending: Family Medicine | Admitting: Family Medicine

## 2019-11-30 ENCOUNTER — Encounter (HOSPITAL_BASED_OUTPATIENT_CLINIC_OR_DEPARTMENT_OTHER): Payer: Self-pay

## 2019-11-30 DIAGNOSIS — I255 Ischemic cardiomyopathy: Secondary | ICD-10-CM | POA: Diagnosis not present

## 2019-11-30 DIAGNOSIS — N400 Enlarged prostate without lower urinary tract symptoms: Secondary | ICD-10-CM | POA: Insufficient documentation

## 2019-11-30 DIAGNOSIS — I739 Peripheral vascular disease, unspecified: Secondary | ICD-10-CM | POA: Diagnosis not present

## 2019-11-30 DIAGNOSIS — Z87891 Personal history of nicotine dependence: Secondary | ICD-10-CM | POA: Insufficient documentation

## 2019-11-30 DIAGNOSIS — Z951 Presence of aortocoronary bypass graft: Secondary | ICD-10-CM | POA: Diagnosis not present

## 2019-11-30 DIAGNOSIS — Z7982 Long term (current) use of aspirin: Secondary | ICD-10-CM | POA: Diagnosis not present

## 2019-11-30 DIAGNOSIS — K219 Gastro-esophageal reflux disease without esophagitis: Secondary | ICD-10-CM | POA: Insufficient documentation

## 2019-11-30 DIAGNOSIS — I714 Abdominal aortic aneurysm, without rupture: Secondary | ICD-10-CM | POA: Diagnosis not present

## 2019-11-30 DIAGNOSIS — Z79899 Other long term (current) drug therapy: Secondary | ICD-10-CM | POA: Diagnosis not present

## 2019-11-30 DIAGNOSIS — Z888 Allergy status to other drugs, medicaments and biological substances status: Secondary | ICD-10-CM | POA: Diagnosis not present

## 2019-11-30 DIAGNOSIS — R0789 Other chest pain: Principal | ICD-10-CM | POA: Insufficient documentation

## 2019-11-30 DIAGNOSIS — M109 Gout, unspecified: Secondary | ICD-10-CM | POA: Diagnosis not present

## 2019-11-30 DIAGNOSIS — Z20822 Contact with and (suspected) exposure to covid-19: Secondary | ICD-10-CM | POA: Diagnosis not present

## 2019-11-30 DIAGNOSIS — E7800 Pure hypercholesterolemia, unspecified: Secondary | ICD-10-CM | POA: Diagnosis not present

## 2019-11-30 DIAGNOSIS — Z8249 Family history of ischemic heart disease and other diseases of the circulatory system: Secondary | ICD-10-CM | POA: Insufficient documentation

## 2019-11-30 DIAGNOSIS — I5022 Chronic systolic (congestive) heart failure: Secondary | ICD-10-CM | POA: Insufficient documentation

## 2019-11-30 DIAGNOSIS — Z955 Presence of coronary angioplasty implant and graft: Secondary | ICD-10-CM | POA: Insufficient documentation

## 2019-11-30 DIAGNOSIS — I447 Left bundle-branch block, unspecified: Secondary | ICD-10-CM | POA: Diagnosis not present

## 2019-11-30 DIAGNOSIS — I110 Hypertensive heart disease with heart failure: Secondary | ICD-10-CM | POA: Insufficient documentation

## 2019-11-30 DIAGNOSIS — M1990 Unspecified osteoarthritis, unspecified site: Secondary | ICD-10-CM | POA: Diagnosis not present

## 2019-11-30 DIAGNOSIS — R001 Bradycardia, unspecified: Secondary | ICD-10-CM | POA: Diagnosis not present

## 2019-11-30 DIAGNOSIS — R079 Chest pain, unspecified: Secondary | ICD-10-CM | POA: Diagnosis present

## 2019-11-30 LAB — COMPREHENSIVE METABOLIC PANEL WITH GFR
ALT: 22 U/L (ref 0–44)
AST: 31 U/L (ref 15–41)
Albumin: 4 g/dL (ref 3.5–5.0)
Alkaline Phosphatase: 60 U/L (ref 38–126)
Anion gap: 7 (ref 5–15)
BUN: 24 mg/dL — ABNORMAL HIGH (ref 8–23)
CO2: 24 mmol/L (ref 22–32)
Calcium: 9.3 mg/dL (ref 8.9–10.3)
Chloride: 106 mmol/L (ref 98–111)
Creatinine, Ser: 1.14 mg/dL (ref 0.61–1.24)
GFR calc Af Amer: >60 mL/min
GFR calc non Af Amer: 55 mL/min — ABNORMAL LOW
Glucose, Bld: 97 mg/dL (ref 70–99)
Potassium: 4 mmol/L (ref 3.5–5.1)
Sodium: 137 mmol/L (ref 135–145)
Total Bilirubin: 0.5 mg/dL (ref 0.3–1.2)
Total Protein: 6.9 g/dL (ref 6.5–8.1)

## 2019-11-30 LAB — CBC WITH DIFFERENTIAL/PLATELET
Abs Immature Granulocytes: 0.02 10*3/uL (ref 0.00–0.07)
Basophils Absolute: 0.1 10*3/uL (ref 0.0–0.1)
Basophils Relative: 1 %
Eosinophils Absolute: 0.2 10*3/uL (ref 0.0–0.5)
Eosinophils Relative: 3 %
HCT: 39.6 % (ref 39.0–52.0)
Hemoglobin: 13.2 g/dL (ref 13.0–17.0)
Immature Granulocytes: 0 %
Lymphocytes Relative: 26 %
Lymphs Abs: 2 10*3/uL (ref 0.7–4.0)
MCH: 32.8 pg (ref 26.0–34.0)
MCHC: 33.3 g/dL (ref 30.0–36.0)
MCV: 98.3 fL (ref 80.0–100.0)
Monocytes Absolute: 0.8 10*3/uL (ref 0.1–1.0)
Monocytes Relative: 10 %
Neutro Abs: 4.8 10*3/uL (ref 1.7–7.7)
Neutrophils Relative %: 60 %
Platelets: 231 10*3/uL (ref 150–400)
RBC: 4.03 MIL/uL — ABNORMAL LOW (ref 4.22–5.81)
RDW: 14 % (ref 11.5–15.5)
WBC: 7.9 10*3/uL (ref 4.0–10.5)
nRBC: 0 % (ref 0.0–0.2)

## 2019-11-30 LAB — LIPASE, BLOOD: Lipase: 32 U/L (ref 11–51)

## 2019-11-30 MED ORDER — ASPIRIN 81 MG PO CHEW
324.0000 mg | CHEWABLE_TABLET | Freq: Once | ORAL | Status: AC
  Administered 2019-11-30: 22:00:00 324 mg via ORAL
  Filled 2019-11-30: qty 4

## 2019-11-30 NOTE — ED Notes (Signed)
Spoke with pt's daughter Izora Gala per pt request ([phone]) to update on plan of care

## 2019-11-30 NOTE — ED Notes (Signed)
ED Provider at bedside. 

## 2019-11-30 NOTE — ED Provider Notes (Signed)
[facility] Provider Note   CSN: [id number] Arrival date & time: 11/30/19  2122     History Chief Complaint  Patient presents with  . Chest Pain    Daniel Reeves is a 86 y.o. male.  Patient here with sudden onset chest pain while sitting at his desk.  He describes it as "sharp pinch" in the left side of his chest started around 7:30 PM.  He took a nitroglycerin around 9 PM and the pain is since resolved.  Reports the pain is in the middle of his chest and did not radiate.  There is no associated shortness of breath, nausea, vomiting or diaphoresis.  He has never had this pain in the past.  Patient reports he has had a CABG x9 and has 1 stents.  His cardiologist is Dr. Stanford Breed.  He denies any abdominal pain or back pain.  No headache.  No cough or fever.  No leg pain or leg swelling.  He remains pain-free.  The pain came on at rest and is since resolved.  The history is provided by the patient.  Chest Pain Associated symptoms: no abdominal pain, no dizziness, no fever, no headache, no nausea, no shortness of breath, no vomiting and no weakness        Past Medical History:  Diagnosis Date  . Abdominal aortic aneurysm ([place])    a. [place] (1/14):  3.3 x 3.4 cm => f/u 11/2013  . Amebic dysentery   . AMEBIC DYSENTERY 11/19/2007   Qualifier: History of  By: Lenna Gilford MD, Deborra Medina   . Anemia 03/07/2017  . ANXIETY 11/19/2007   Qualifier: Diagnosis of  By: Lenna Gilford MD, Deborra Medina   . Arthritis 03/07/2017  . 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 BACK PAIN, LUMBAR 11/16/2007   Qualifier: Diagnosis of  By: Julien Girt CMA, Leigh    . Benign prostatic hypertrophy   . BENIGN PROSTATIC HYPERTROPHY, HX OF 11/16/2007   Qualifier: Diagnosis of  By: Julien Girt CMA, Leigh    . BRBPR (bright red blood per rectum) 11/01/2016  . CAD (coronary artery disease)    a. s/p CABG in 1979 and 1993;  b. LHC (5/14):  LM, LAD, CFX and RCA  occluded; L-LAD ok, dLAD occluded after insertion of LIMA, S-OM occluded, S-PDA/AM 80-90 => PCI with Promus DES; EF 25%  . Cardiomyopathy, ischemic 06/05/2013  . Cerumen impaction    Bilateral  . Chicken pox as a child  . Chronic systolic CHF (congestive heart failure) ([place])   . COLONIC POLYPS 07/01/2008   Qualifier: Diagnosis of  By: Lenna Gilford MD, Deborra Medina   . Degenerative joint disease   . DEGENERATIVE JOINT DISEASE 11/16/2007   Qualifier: Diagnosis of  By: Julien Girt CMA, Marliss Czar    . Diverticulosis of colon   . DIVERTICULOSIS OF COLON 07/01/2008   Qualifier: Diagnosis of  By: Lenna Gilford MD, Deborra Medina   . Double vision 03/07/2017  . Essential hypertension 11/16/2007   Qualifier: Diagnosis of  By: Julien Girt CMA, Marliss Czar    . Fingernail abnormalities 03/07/2017  . FLANK PAIN, RIGHT 02/03/2010   Qualifier: History of  By: Lenna Gilford MD, Deborra Medina   . GERD (gastroesophageal reflux disease)   . GOUT 11/16/2007   Qualifier: Diagnosis of  By: Julien Girt CMA, Marliss Czar    . Hearing loss 11/24/2014  . Heart murmur   . History of shingles 10/27/2017  . Hypercholesterolemia   . HYPERCHOLESTEROLEMIA 11/16/2007   Qualifier:  Diagnosis of  By: Julien Girt CMA, Leigh    . Ischemic cardiomyopathy    a. echo (09/05/13): EF 35%, diffuse HK worsened distal septal, mid/distal inferior and apical region, grade 1 diastolic dysfunction, mild LAE.    [name] Kidney stone 08/17/2011  . Loss of hearing   . Lumbar back pain   . Measles as a child  . Medicare annual wellness visit, subsequent 11/24/2014   Sees Dr Delman Cheadle for dermatology Sees Dr Roni Bread of Urology Sees Dr Stanford Breed of cardiology Sees Dr Virginia Rochester of Opthamology No further colonoscopies warranted       . Mumps as a child  . Nephrolithiasis   . Pain in joint, lower leg 07/29/2014  . PERIPHERAL VASCULAR DISEASE 11/16/2007   Qualifier: Diagnosis of  By: Julien Girt CMA, Marliss Czar    . Peripheral vascular disease ([place])   . Rectal bleeding 03/07/2017  . Shingles 07/29/2014  . Sun-damaged skin 05/31/2014    Patient  Active Problem List   Diagnosis Date Noted  . Finger laceration 04/28/2019  . Leg swelling 03/09/2019  . Hyperglycemia 01/22/2019  . Otitis externa 01/22/2019  . Edema 12/17/2018  . Right hip pain 12/17/2018  . Scrotal bleeding 03/01/2018  . Foot pain, right 03/01/2018  . Hemorrhoid 10/29/2017  . History of shingles 10/27/2017  . Anemia 03/07/2017  . Double vision 03/07/2017  . Arthritis 03/07/2017  . Medicare annual wellness visit, subsequent 11/24/2014  . Hearing loss 11/24/2014  . Pain in joint, lower leg 07/29/2014  . Sun-damaged skin 05/31/2014  . Chicken pox   . Measles   . Mumps   . Cardiomyopathy, ischemic 06/05/2013  . Atherosclerosis of coronary artery bypass graft with unstable angina pectoris ([place]) 04/19/2013  . Kidney stone 08/17/2011  . CAD (coronary artery disease)   . CERUMEN IMPACTION, BILATERAL 02/03/2010  . FLANK PAIN, RIGHT 02/03/2010  . Abdominal aortic aneurysm ([place]) 11/04/2009  . COLONIC POLYPS 07/01/2008  . Anxiety state 11/19/2007  . HYPERCHOLESTEROLEMIA 11/16/2007  . Gout 11/16/2007  . Essential hypertension 11/16/2007  . PERIPHERAL VASCULAR DISEASE 11/16/2007  . GERD 11/16/2007  . Osteoarthritis 11/16/2007  . BACK PAIN, LUMBAR 11/16/2007  . BPH (benign prostatic hyperplasia) 11/16/2007    Past Surgical History:  Procedure Laterality Date  . CORONARY ANGIOPLASTY WITH STENT PLACEMENT  04/18/2013   RCA       . CORONARY ARTERY BYPASS GRAFT  1979   x4 SVG-DIAG-LAD, SVG-OM-PDA  . CORONARY ARTERY BYPASS GRAFT  1993   Redo x5 by Dr Harlow Asa; [facility], SVG-OM, SVG-AM-PL  . Decompressive laminectomy  01/2006   L2 - scarum by Dr. Shellia Carwin  . HEMORRHOID SURGERY     fissure with hemorrhoid corrected at age 68  . INGUINAL HERNIA REPAIR  1994   Right by Dr Harlow Asa  . INGUINAL HERNIA REPAIR  1996   Left by Dr. Harlow Asa  . LEFT HEART CATHETERIZATION WITH CORONARY ANGIOGRAM N/A 09/23/2013   Procedure: LEFT HEART CATHETERIZATION WITH CORONARY  ANGIOGRAM;  Surgeon: Blane Ohara, MD;  Location: [facility] CATH LAB;  Service: Cardiovascular;  Laterality: N/A;  . PERCUTANEOUS CORONARY STENT INTERVENTION (PCI-S) N/A 04/18/2013   Procedure: PERCUTANEOUS CORONARY STENT INTERVENTION (PCI-S);  Surgeon: Sherren Mocha, MD;  Location: [facility] CATH LAB;  Service: Cardiovascular;  Laterality: N/A;  . TONSILLECTOMY         Family History  Problem Relation Age of Onset  . Parkinsonism Brother   . Diabetes Maternal Grandmother   . Depression Daughter   . Other Son  4 stents  . Heart disease Son   . Diabetes Son        type 2    Social History   Tobacco Use  . Smoking status: Former Smoker    Quit date: 11/28/1944    Years since quitting: 75.0  . Smokeless tobacco: Never Used  Substance Use Topics  . Alcohol use: Yes    Alcohol/week: 2.0 standard drinks    Types: 2 Standard drinks or equivalent per week    Comment: daily rum  or wine  . Drug use: No    Home Medications Prior to Admission medications   Medication Sig Start Date End Date Taking? Authorizing Provider  allopurinol (ZYLOPRIM) 300 MG tablet Take 1 tablet (300 mg total) by mouth daily. 10/07/19   [name], MD  aspirin EC 81 MG tablet Take 81 mg by mouth every morning.     [provider]  B Complex-C (B-COMPLEX WITH VITAMIN C) tablet Take 1 tablet by mouth 2 (two) times daily. 09/16/19   Shelda Pal, DO  Cholecalciferol (VITAMIN D-3 PO) Take 5,000 Units by mouth daily with breakfast.     [provider]  famotidine (PEPCID) 20 MG tablet Take 1 tablet (20 mg total) by mouth 2 (two) times daily. 10/31/19   [name], MD  finasteride (PROSCAR) 5 MG tablet Takes every third day    [provider]  fluticasone (FLONASE) 50 MCG/ACT nasal spray Place 2 sprays into both nostrils daily. 09/16/19   Shelda Pal, DO  folic acid (FOLVITE) A999333 MCG tablet Take 400 mcg by mouth 2 (two) times daily.     [provider]    isosorbide mononitrate (IMDUR) 30 MG 24 hr tablet Take 3 tablets (90 mg total) by mouth daily. 12/11/18   [name], MD  losartan (COZAAR) 50 MG tablet TAKE 1 TABLET BY MOUTH ONCE DAILY .  MUST  KEEP  APPOINTMENT  FOR  REFILLS. 08/28/19   Lelon Perla, MD  meclizine (ANTIVERT) 25 MG tablet Take 1 tablet (25 mg total) by mouth 3 (three) times daily as needed for dizziness. 05/26/19   Veryl Speak, MD  metoprolol succinate (TOPROL-XL) 25 MG 24 hr tablet TAKE 1/2 TABLET(12.5 MG) BY MOUTH DAILY 11/27/18   Lelon Perla, MD  Misc Natural Products (OSTEO BI-FLEX ADV JOINT SHIELD) TABS Take 1 tablet by mouth 2 (two) times daily.     [provider]  nitroGLYCERIN (NITROSTAT) 0.4 MG SL tablet Place 1 tablet (0.4 mg total) under the tongue every 5 (five) minutes as needed. For chest pain. 11/27/18   Lelon Perla, MD  simvastatin (ZOCOR) 40 MG tablet TAKE 1 TABLET BY MOUTH ONCE DAILY IN THE EVENING 10/07/19   [name], MD    Allergies    Lisinopril, Methocarbamol, Other, Pregabalin, and Ramipril  Review of Systems   Review of Systems  Constitutional: Negative for activity change, appetite change and fever.  HENT: Negative for congestion and rhinorrhea.   Eyes: Negative for visual disturbance.  Respiratory: Positive for chest tightness. Negative for shortness of breath.   Cardiovascular: Positive for chest pain.  Gastrointestinal: Negative for abdominal pain, nausea and vomiting.  Genitourinary: Negative for dysuria and hematuria.  Musculoskeletal: Negative for arthralgias.  Skin: Negative for rash.  Neurological: Negative for dizziness, weakness and headaches.    all other systems are negative except as noted in the HPI and PMH.  Physical Exam Updated Vital Signs Pulse (!) 59  Temp 98.6 F (37 C) (Oral)   Resp 20   Ht 6' (1.829 m)   Wt 71.7 kg   BMI 21.43 kg/m   Physical Exam Vitals and nursing note reviewed.  Constitutional:      General: He is not  in acute distress.    Appearance: He is well-developed.  HENT:     Head: Normocephalic and atraumatic.     Mouth/Throat:     Pharynx: No oropharyngeal exudate.  Eyes:     Conjunctiva/sclera: Conjunctivae normal.     Pupils: Pupils are equal, round, and reactive to light.  Neck:     Comments: No meningismus. Cardiovascular:     Rate and Rhythm: Normal rate and regular rhythm.     Heart sounds: Normal heart sounds. No murmur.  Pulmonary:     Effort: Pulmonary effort is normal. No respiratory distress.     Breath sounds: Normal breath sounds.  Abdominal:     Palpations: Abdomen is soft.     Tenderness: There is no abdominal tenderness. There is no guarding or rebound.  Musculoskeletal:        General: No tenderness. Normal range of motion.     Cervical back: Normal range of motion and neck supple.  Skin:    General: Skin is warm.  Neurological:     Mental Status: He is alert and oriented to person, place, and time.     Cranial Nerves: No cranial nerve deficit.     Motor: No abnormal muscle tone.     Coordination: Coordination normal.     Comments: No ataxia on finger to nose bilaterally. No pronator drift. 5/5 strength throughout. CN 2-12 intact.Equal grip strength. Sensation intact.   Psychiatric:        Behavior: Behavior normal.     ED Results / Procedures / Treatments   Labs (all labs ordered are listed, but only abnormal results are displayed) Labs Reviewed  CBC WITH DIFFERENTIAL/PLATELET - Abnormal; Notable for the following components:      Result Value   RBC 4.03 (*)    All other components within normal limits  COMPREHENSIVE METABOLIC PANEL - Abnormal; Notable for the following components:   BUN 24 (*)    GFR calc non Af Amer 55 (*)    All other components within normal limits  TROPONIN I (HIGH SENSITIVITY) - Abnormal; Notable for the following components:   Troponin I (High Sensitivity) 26 (*)    All other components within normal limits  SARS CORONAVIRUS 2  (TAT 6-24 HRS)  LIPASE, BLOOD  TROPONIN I (HIGH SENSITIVITY)    EKG EKG Interpretation  Date/Time:  Saturday November 30 2019 21:27:41 EST Ventricular Rate:  59 PR Interval:  198 QRS Duration: 164 QT Interval:  466 QTC Calculation: 461 R Axis:   -5 Text Interpretation: Sinus bradycardia Left bundle branch block Abnormal ECG anterior ST elevation, similar to previous d/w Dr. Irish Lack Confirmed by Ezequiel Essex [phone]) on 11/30/2019 9:58:14 PM   Radiology DG Chest Portable 1 View  Result Date: 11/30/2019 CLINICAL DATA:  Chest pain. EXAM: PORTABLE CHEST 1 VIEW COMPARISON:  December 23, 2014. FINDINGS: The patient is status post prior median sternotomy. There is no pneumothorax. No large pleural effusion. Heart size is borderline enlarged. There is no focal infiltrate. IMPRESSION: No active disease. Electronically Signed   By: Constance Holster M.D.   On: 11/30/2019 23:30    Procedures .Critical Care Performed by: Ezequiel Essex, MD Authorized by: Ezequiel Essex, MD   Critical care  provider statement:    Critical care time (minutes):  35   Critical care was time spent personally by me on the following activities:  Discussions with consultants, evaluation of patient's response to treatment, examination of patient, ordering and performing treatments and interventions, ordering and review of laboratory studies, ordering and review of radiographic studies, pulse oximetry, re-evaluation of patient's condition, obtaining history from patient or surrogate and review of old charts   (including critical care time)  Medications Ordered in ED Medications  aspirin chewable tablet 324 mg (has no administration in time range)    ED Course  I have reviewed the triage vital signs and the nursing notes.  Pertinent labs & imaging results that were available during my care of the patient were reviewed by me and considered in my medical decision making (see chart for details).    MDM  Rules/Calculators/A&P                      Episode of chest pain onset at rest that is since resolved.  His EKG has a left bundle branch block with ST elevation in V2 and V3 of approximately 4 mm.  This is similar to his previous.  Discussed with Dr. Irish Lack of interventional cardiology who agrees does not meet STEMI criteria.  Patient given aspirin.  He remains chest pain-free.  Labs are reassuring and chest x-ray is negative..  Troponin minimally elevated at 26. Will hold heparin at this time.  HEART score 6-7.  Low suspicion for pulmonary embolism or aortic dissection.  Admission to hospitalist service discussed with Dr. Myna Hidalgo.    Final Clinical Impression(s) / ED Diagnoses Final diagnoses:  Chest pain, unspecified type    Rx / DC Orders ED Discharge Orders    None       Ndia Sampath, Annie Main, MD 12/01/19 [phone]

## 2019-11-30 NOTE — ED Notes (Signed)
Paged Dr. Irish Lack @ [phone]

## 2019-11-30 NOTE — ED Triage Notes (Addendum)
Pt was sitting at his desk when he noticed a dull achy chest pain on the L sternal border. Pt denies ShOB, nausea, or other associated symptoms. Pt took 1 NTG tab with relief. No pain at the present time.

## 2019-12-01 LAB — TROPONIN I (HIGH SENSITIVITY)
Troponin I (High Sensitivity): 21 ng/L — ABNORMAL HIGH (ref <18)
Troponin I (High Sensitivity): 26 ng/L — ABNORMAL HIGH (ref <18)
Troponin I (High Sensitivity): 26 ng/L — ABNORMAL HIGH (ref <18)

## 2019-12-01 LAB — SARS CORONAVIRUS 2 (TAT 6-24 HRS): SARS Coronavirus 2: NEGATIVE

## 2019-12-01 MED ORDER — FAMOTIDINE 20 MG PO TABS: 20.0000 mg | ORAL_TABLET | Freq: Two times a day (BID) | ORAL | Status: DC

## 2019-12-01 MED ORDER — ASPIRIN EC 81 MG PO TBEC: 81.0000 mg | DELAYED_RELEASE_TABLET | Freq: Every morning | ORAL | Status: DC

## 2019-12-01 MED ORDER — METOPROLOL SUCCINATE ER 25 MG PO TB24: 25.0000 mg | ORAL_TABLET | Freq: Every day | ORAL | Status: DC

## 2019-12-01 MED ORDER — ISOSORBIDE MONONITRATE ER 60 MG PO TB24
90.0000 mg | ORAL_TABLET | Freq: Every day | ORAL | Status: DC
  Filled 2019-12-01: qty 1

## 2019-12-01 MED ORDER — SIMVASTATIN 40 MG PO TABS
40.0000 mg | ORAL_TABLET | Freq: Every evening | ORAL | Status: DC
  Filled 2019-12-01: qty 1

## 2019-12-01 MED ORDER — FLUTICASONE PROPIONATE 50 MCG/ACT NA SUSP
2.0000 | Freq: Every day | NASAL | Status: DC
  Filled 2019-12-01: qty 16

## 2019-12-01 MED ORDER — LOSARTAN POTASSIUM 25 MG PO TABS: 50.0000 mg | ORAL_TABLET | Freq: Every day | ORAL | Status: DC

## 2019-12-01 MED ORDER — ALLOPURINOL 300 MG PO TABS
300.0000 mg | ORAL_TABLET | Freq: Every day | ORAL | Status: DC
  Filled 2019-12-01: qty 1

## 2019-12-01 NOTE — Discharge Instructions (Addendum)
Follow-up with Dr. Stanford Breed.  Return to the ER if you develop chest pain with exertion or if the pain worsens.

## 2019-12-01 NOTE — ED Notes (Signed)
Pt denies pain, states he is feeling better and would like to be discharged if possible. EDP made aware.

## 2019-12-01 NOTE — ED Provider Notes (Addendum)
  Physical Exam  BP (!) 149/73 (BP Location: Left Arm)   Pulse (!) 59   Temp 98.6 F (37 C) (Oral)   Resp 14   Ht 6' (1.829 m)   Wt 71.7 kg   SpO2 99%   BMI 21.43 kg/m   Physical Exam  ED Course/Procedures     Procedures  MDM  Patient had presented with chest pain.  Has been here for around 11 hours now and really would like to go home.  Troponin is mildly elevated at 26 but stable over 2.  Stable EKG.  A few days ago had wrecked his yard and had to use a 2 x 4 to help move his mower and had no pain with that.  Potentially could be musculoskeletal pain now.  Will repeat troponin now 8 hours after the last 1 and reevaluate.  Patient is eager to go home       Davonna Belling, MD 12/01/19 [phone]  Repeat troponin is 21.  Patient is eager to go home.  No further chest pain.  Doubt this is acute ischemia.  Has a cardiologist.  Have recently done exertion without any chest pain.  Will discharge home with cardiology follow-up   Davonna Belling, MD 12/01/19 [phone]

## 2019-12-01 NOTE — ED Notes (Signed)
Pt ambulatory to [facility]. NAD noted. Pt denies chest pain. Pt ambulatory with steady gait.

## 2019-12-01 NOTE — ED Notes (Signed)
Pt daughter nancy called and updated with wait of bed at [facility]. Pt family has no concerns at this time.

## 2019-12-01 NOTE — ED Notes (Signed)
Left voicemail on pt's daughter's cell phone to notify of pt's admission

## 2019-12-01 NOTE — ED Notes (Signed)
Pt daughter Izora [phone]

## 2019-12-04 ENCOUNTER — Ambulatory Visit: Payer: Medicare Other

## 2019-12-05 ENCOUNTER — Encounter: Payer: Self-pay | Admitting: Family Medicine

## 2019-12-10 ENCOUNTER — Other Ambulatory Visit: Payer: Self-pay | Admitting: Cardiology

## 2019-12-17 ENCOUNTER — Other Ambulatory Visit: Payer: Self-pay

## 2019-12-17 MED ORDER — METOPROLOL SUCCINATE ER 25 MG PO TB24: ORAL_TABLET | ORAL | 0 refills | Status: DC

## 2019-12-17 NOTE — Telephone Encounter (Signed)
Refill Daniel Reeves  

## 2019-12-18 ENCOUNTER — Ambulatory Visit (INDEPENDENT_AMBULATORY_CARE_PROVIDER_SITE_OTHER): Payer: Medicare Other | Admitting: Gastroenterology

## 2019-12-18 ENCOUNTER — Encounter: Payer: Self-pay | Admitting: Gastroenterology

## 2019-12-18 ENCOUNTER — Other Ambulatory Visit: Payer: Self-pay

## 2019-12-18 VITALS — BP 142/60 | HR 57 | Temp 97.7°F | Ht 72.0 in | Wt 162.5 lb

## 2019-12-18 DIAGNOSIS — K219 Gastro-esophageal reflux disease without esophagitis: Secondary | ICD-10-CM

## 2019-12-18 DIAGNOSIS — K921 Melena: Secondary | ICD-10-CM | POA: Diagnosis not present

## 2019-12-18 DIAGNOSIS — K641 Second degree hemorrhoids: Secondary | ICD-10-CM | POA: Diagnosis not present

## 2019-12-18 NOTE — Patient Instructions (Addendum)
It has been recommended to you by your physician that you have a(n) Flex sig completed. Per your request, we did not schedule the procedure(s) today. Please contact our office at [phone] should you decide to have the procedure completed. You will be scheduled for a pre-visit and procedure at that time.  It was a pleasure to see you today!  Vito Cirigliano, D.O.

## 2019-12-18 NOTE — Progress Notes (Signed)
Chief Complaint: Hematochezia, diarrhea, change in bowel habits  Referring Provider:     [name], MD  HPI:    Daniel Reeves is a 86 y.o. active, very pleasant male with a history of CAD/CABG 1979/9093, hyperlipidemia, HTN, BPH, referred to the [facility] for evaluation of hematochezia.  He states he has had intermittent BRBBPR, with increasing frequency/volume lately.  Longstanding history of alternating bowel habits, ranging between solids and loose, and has been that way for years- unchanged. No appreciatble change in stool with dietary intake.   Labs from ER in 11/30/2019 (chest pain): BUN 24, otherwise normal CMP, troponin 26 --> 21, normal CBC, Covid negative.  Normal chest x-ray.  Was previously seen by Dr. Ardis Hughs back in 02/2010 with intermittent fecal urgency/incontinence, mucus discharge, and some bleeding.  Trialed on fiber supplement and evaluate with colonoscopy, n/f diverticulosis.  Of note, he reports anal fissure surgery in his 61s.  Endoscopic history: -Colonoscopy (02/2010, Dr. Ardis Hughs): Moderate to severe left-sided diverticulosis with peridiverticular edema, tortuosity of the colon.  Normal retroflexion. -Colonoscopy (2007, Dr. Velora Heckler): Benign polyp, repeat in 4 years  Past Medical History:  Diagnosis Date  . Abdominal aortic aneurysm ([place])    a. [place] (1/14):  3.3 x 3.4 cm => f/u 11/2013  . Amebic dysentery   . AMEBIC DYSENTERY 11/19/2007   Qualifier: History of  By: Lenna Gilford MD, Deborra Medina   . Anemia 03/07/2017  . ANXIETY 11/19/2007   Qualifier: Diagnosis of  By: Lenna Gilford MD, Deborra Medina   . Arthritis 03/07/2017  . Atherosclerosis of coronary artery bypass graft with unstable angina pectoris ([place]) 04/19/2013  . BACK PAIN, LUMBAR 11/16/2007   Qualifier: Diagnosis of  By: Julien Girt CMA, Leigh    . Benign prostatic hypertrophy   . BENIGN PROSTATIC HYPERTROPHY, HX OF 11/16/2007   Qualifier: Diagnosis of  By: Julien Girt CMA, Leigh    . BRBPR (bright  red blood per rectum) 11/01/2016  . CAD (coronary artery disease)    a. s/p CABG in 1979 and 1993;  b. LHC (5/14):  LM, LAD, CFX and RCA occluded; L-LAD ok, dLAD occluded after insertion of LIMA, S-OM occluded, S-PDA/AM 80-90 => PCI with Promus DES; EF 25%  . Cardiomyopathy, ischemic 06/05/2013  . Cerumen impaction    Bilateral  . Chicken pox as a child  . Chronic systolic CHF (congestive heart failure) ([place])   . COLONIC POLYPS 07/01/2008   Qualifier: Diagnosis of  By: Lenna Gilford MD, Deborra Medina   . Degenerative joint disease   . DEGENERATIVE JOINT DISEASE 11/16/2007   Qualifier: Diagnosis of  By: Julien Girt CMA, Marliss Czar    . Diverticulosis of colon   . DIVERTICULOSIS OF COLON 07/01/2008   Qualifier: Diagnosis of  By: Lenna Gilford MD, Deborra Medina   . Double vision 03/07/2017  . Essential hypertension 11/16/2007   Qualifier: Diagnosis of  By: Julien Girt CMA, Marliss Czar    . Fingernail abnormalities 03/07/2017  . FLANK PAIN, RIGHT 02/03/2010   Qualifier: History of  By: Lenna Gilford MD, Deborra Medina   . GERD (gastroesophageal reflux disease)   . GOUT 11/16/2007   Qualifier: Diagnosis of  By: Julien Girt CMA, Marliss Czar    . Hearing loss 11/24/2014  . Heart murmur   . History of shingles 10/27/2017  . Hypercholesterolemia   . HYPERCHOLESTEROLEMIA 11/16/2007   Qualifier: Diagnosis of  By: Julien Girt CMA, Marliss Czar    . Ischemic cardiomyopathy    a. echo (09/05/13):  EF 35%, diffuse HK worsened distal septal, mid/distal inferior and apical region, grade 1 diastolic dysfunction, mild LAE.    [name] Kidney stone 08/17/2011  . Loss of hearing   . Lumbar back pain   . Measles as a child  . Medicare annual wellness visit, subsequent 11/24/2014   Sees Dr Delman Cheadle for dermatology Sees Dr Roni Bread of Urology Sees Dr Stanford Breed of cardiology Sees Dr Virginia Rochester of Opthamology No further colonoscopies warranted       . Mumps as a child  . Nephrolithiasis   . Pain in joint, lower leg 07/29/2014  . PERIPHERAL VASCULAR DISEASE 11/16/2007   Qualifier: Diagnosis of  By: Julien Girt CMA, Marliss Czar      . Peripheral vascular disease ([place])   . Rectal bleeding 03/07/2017  . Shingles 07/29/2014  . Sun-damaged skin 05/31/2014     Past Surgical History:  Procedure Laterality Date  . CORONARY ANGIOPLASTY WITH STENT PLACEMENT  04/18/2013   RCA       . CORONARY ARTERY BYPASS GRAFT  1979   x4 SVG-DIAG-LAD, SVG-OM-PDA  . CORONARY ARTERY BYPASS GRAFT  1993   Redo x5 by Dr Harlow Asa; [facility], SVG-OM, SVG-AM-PL  . Decompressive laminectomy  01/2006   L2 - scarum by Dr. Shellia Carwin  . HEMORRHOID SURGERY     fissure with hemorrhoid corrected at age 11  . INGUINAL HERNIA REPAIR  1994   Right by Dr Harlow Asa  . INGUINAL HERNIA REPAIR  1996   Left by Dr. Harlow Asa  . LEFT HEART CATHETERIZATION WITH CORONARY ANGIOGRAM N/A 09/23/2013   Procedure: LEFT HEART CATHETERIZATION WITH CORONARY ANGIOGRAM;  Surgeon: Blane Ohara, MD;  Location[facility] LAB;  Service: Cardiovascular;  Laterality: N/A;  . PERCUTANEOUS CORONARY STENT INTERVENTION (PCI-S) N/A 04/18/2013   Procedure: PERCUTANEOUS CORONARY STENT INTERVENTION (PCI-S);  Surgeon: Sherren Mocha, MD;  Location: [facility] CATH LAB;  Service: Cardiovascular;  Laterality: N/A;  . TONSILLECTOMY     Family History  Problem Relation Age of Onset  . Parkinsonism Brother   . Diabetes Maternal Grandmother   . Depression Daughter   . Other Son        4 stents  . Heart disease Son   . Diabetes Son        type 2  . Colon cancer Neg Hx   . Esophageal cancer Neg Hx    Social History   Tobacco Use  . Smoking status: Former Smoker    Quit date: 11/28/1944    Years since quitting: 75.1  . Smokeless tobacco: Never Used  Substance Use Topics  . Alcohol use: Yes    Alcohol/week: 2.0 standard drinks    Types: 2 Standard drinks or equivalent per week    Comment: daily rum  or wine  . Drug use: No   Current Outpatient Medications  Medication Sig Dispense Refill  . allopurinol (ZYLOPRIM) 300 MG tablet Take 1 tablet (300 mg total) by mouth daily. 90 tablet 0  .  aspirin EC 81 MG tablet Take 81 mg by mouth every morning.     . B Complex-C (B-COMPLEX WITH VITAMIN C) tablet Take 1 tablet by mouth daily.     . Cholecalciferol (VITAMIN D-3 PO) Take 5,000 Units by mouth daily with breakfast.     . famotidine (PEPCID) 20 MG tablet Take 1 tablet (20 mg total) by mouth 2 (two) times daily. 90 tablet 3  . finasteride (PROSCAR) 5 MG tablet Takes every third day    . fluticasone (FLONASE) 50 MCG/ACT nasal spray  Place 2 sprays into both nostrils daily. 16 g 2  . folic acid (FOLVITE) A999333 MCG tablet Take 400 mcg by mouth 2 (two) times daily.     . isosorbide mononitrate (IMDUR) 30 MG 24 hr tablet Take 3 tablets (90 mg total) by mouth daily. 270 tablet 3  . losartan (COZAAR) 50 MG tablet TAKE 1 TABLET BY MOUTH ONCE DAILY .  MUST  KEEP  APPOINTMENT  FOR  REFILLS. 90 tablet 1  . metoprolol succinate (TOPROL-XL) 25 MG 24 hr tablet Take 1/2 (one-half) tablet by mouth once daily 45 tablet 0  . Misc Natural Products (OSTEO BI-FLEX ADV JOINT SHIELD) TABS Take 1 tablet by mouth 2 (two) times daily.     . nitroGLYCERIN (NITROSTAT) 0.4 MG SL tablet Place 1 tablet (0.4 mg total) under the tongue every 5 (five) minutes as needed. For chest pain. 25 tablet 6  . simvastatin (ZOCOR) 40 MG tablet TAKE 1 TABLET BY MOUTH ONCE DAILY IN THE EVENING 90 tablet 1  . meclizine (ANTIVERT) 25 MG tablet Take 1 tablet (25 mg total) by mouth 3 (three) times daily as needed for dizziness. 30 tablet 0   No current facility-administered medications for this visit.   Allergies  Allergen Reactions  . Lisinopril     REACTION: dizziness  . Methocarbamol     REACTION: pt states "dizzy"  . Other   . Pregabalin     REACTION: pt states "dizzy"  . Ramipril     REACTION: hives and dizziness     Review of Systems: All systems reviewed and negative except where noted in HPI.     Physical Exam:    Wt Readings from Last 3 Encounters:  12/18/19 162 lb 8 oz (73.7 kg)  11/30/19 158 lb (71.7 kg)   11/14/19 161 lb (73 kg)    BP (!) 142/60   Pulse (!) 57   Temp 97.7 F (36.5 C)   Ht 6' (1.829 m)   Wt 162 lb 8 oz (73.7 kg)   BMI 22.04 kg/m  Constitutional:  Pleasant, in no acute distress. Psychiatric: Normal mood and affect. Behavior is normal. EENT: Pupils normal.  Conjunctivae are normal. No scleral icterus. Neck supple. No cervical LAD. Cardiovascular: Normal rate, regular rhythm. No edema Pulmonary/chest: Effort normal and breath sounds normal. No wheezing, rales or rhonchi. Abdominal: Soft, nondistended, nontender. Bowel sounds active throughout. There are no masses palpable. No hepatomegaly. Neurological: Alert and oriented to person place and time. Skin: Skin is warm and dry. No rashes noted. Rectal exam: Sensation intact and preserved anal wink. No external anal fissures, external hemorrhoids or skin tags. Normal sphincter tone.  Internal hemorrhoids and possible internal fissure noted on anoscopy.  Tenderness with anoscopy and digital exam in the 12 o'clock position which limited exam somewhat. (Chaperone: Patient was offered a chaperone for this sensitive examination, but declined).    ASSESSMENT AND PLAN;   1) Hematochezia 2) Internal hemorrhoids Exam today with inflamed internal hemorrhoids and possible overlying fissure.  Does report a history of anal fissure surgery in his 56s.  Exam somewhat limited by tenderness to digital exam and anoscopy.  Plan for the following:  -Flexible sigmoidoscopy with sedation to assess grade/severity of hemorrhoids, presence of internal fissure under anesthesia, and rule out additional distal colonic pathology -Had colonoscopy completed 2011 which was otherwise normal.  Discussed the utility of bowel prep with complete colonoscopy with patient today, and he prefers flexible sigmoidoscopy -Depending on endoscopic findings, plan for either hemorrhoid  band ligation vs referral to Colorectal Surgery  3) Alternating bowel habits: -Stable  for many years -Resume high-fiber diet with fiber supplement as needed  4) GERD: -Well-controlled on current regimen.  No changes needed at this time  The indications, risks, and benefits of flexible sigmoidoscopy were explained to the patient in detail. Risks include but are not limited to bleeding, perforation, adverse reaction to medications, and cardiopulmonary compromise.  Discussed additional risks given advanced age.  Sequelae include but are not limited to the possibility of surgery, hospitalization, and mortality. The patient verbalized understanding and wished to proceed. All questions answered, referred for scheduling. Further recommendations pending results of the exam.    Lavena Bullion, DO, FACG  12/18/2019, 10:36 AM   [name], MD

## 2019-12-19 ENCOUNTER — Encounter: Payer: Self-pay | Admitting: Gastroenterology

## 2019-12-23 ENCOUNTER — Other Ambulatory Visit: Payer: Self-pay

## 2019-12-24 ENCOUNTER — Encounter: Payer: Self-pay | Admitting: Family Medicine

## 2019-12-24 ENCOUNTER — Ambulatory Visit (INDEPENDENT_AMBULATORY_CARE_PROVIDER_SITE_OTHER): Payer: Medicare Other | Admitting: Family Medicine

## 2019-12-24 ENCOUNTER — Other Ambulatory Visit: Payer: Self-pay

## 2019-12-24 DIAGNOSIS — R739 Hyperglycemia, unspecified: Secondary | ICD-10-CM

## 2019-12-24 DIAGNOSIS — I10 Essential (primary) hypertension: Secondary | ICD-10-CM

## 2019-12-24 DIAGNOSIS — F4321 Adjustment disorder with depressed mood: Secondary | ICD-10-CM

## 2019-12-24 DIAGNOSIS — D649 Anemia, unspecified: Secondary | ICD-10-CM

## 2019-12-24 DIAGNOSIS — F4320 Adjustment disorder, unspecified: Secondary | ICD-10-CM | POA: Insufficient documentation

## 2019-12-24 NOTE — Assessment & Plan Note (Signed)
Well controlled, no changes to meds. Encouraged heart healthy diet such as the DASH diet and exercise as tolerated.  °

## 2019-12-24 NOTE — Progress Notes (Signed)
Subjective:    Patient ID: Daniel Reeves, male    DOB: [birth year], 86 y.o.   MRN: [id number]  Chief Complaint  Patient presents with  . Follow-up    HPI Patient is in today for follow up on chronic medical concerns. No recent febrile illness or hospitalizations. He is still grieving the loss of his wife of 53 years still. Is very stressed about various projects around the house and his taxes. His physical health has been well. He is staying active and maintaining a heart healthy diet. Denies CP/palp/SOB/HA/congestion/fevers/GI or GU c/o. Taking meds as prescribed  Past Medical History:  Diagnosis Date  . Abdominal aortic aneurysm ([place])    a. [place] (1/14):  3.3 x 3.4 cm => f/u 11/2013  . Amebic dysentery   . AMEBIC DYSENTERY 11/19/2007   Qualifier: History of  By: Lenna Gilford MD, Deborra Medina   . Anemia 03/07/2017  . ANXIETY 11/19/2007   Qualifier: Diagnosis of  By: Lenna Gilford MD, Deborra Medina   . Arthritis 03/07/2017  . Atherosclerosis of coronary artery bypass graft with unstable angina pectoris ([place]) 04/19/2013  . BACK PAIN, LUMBAR 11/16/2007   Qualifier: Diagnosis of  By: Julien Girt CMA, Leigh    . Benign prostatic hypertrophy   . BENIGN PROSTATIC HYPERTROPHY, HX OF 11/16/2007   Qualifier: Diagnosis of  By: Julien Girt CMA, Leigh    . BRBPR (bright red blood per rectum) 11/01/2016  . CAD (coronary artery disease)    a. s/p CABG in 1979 and 1993;  b. LHC (5/14):  LM, LAD, CFX and RCA occluded; L-LAD ok, dLAD occluded after insertion of LIMA, S-OM occluded, S-PDA/AM 80-90 => PCI with Promus DES; EF 25%  . Cardiomyopathy, ischemic 06/05/2013  . Cerumen impaction    Bilateral  . Chicken pox as a child  . Chronic systolic CHF (congestive heart failure) ([place])   . COLONIC POLYPS 07/01/2008   Qualifier: Diagnosis of  By: Lenna Gilford MD, Deborra Medina   . Degenerative joint disease   . DEGENERATIVE JOINT DISEASE 11/16/2007   Qualifier: Diagnosis of  By: Julien Girt CMA, Marliss Czar    . Diverticulosis of colon   . DIVERTICULOSIS OF  COLON 07/01/2008   Qualifier: Diagnosis of  By: Lenna Gilford MD, Deborra Medina   . Double vision 03/07/2017  . Essential hypertension 11/16/2007   Qualifier: Diagnosis of  By: Julien Girt CMA, Marliss Czar    . Fingernail abnormalities 03/07/2017  . FLANK PAIN, RIGHT 02/03/2010   Qualifier: History of  By: Lenna Gilford MD, Deborra Medina   . GERD (gastroesophageal reflux disease)   . GOUT 11/16/2007   Qualifier: Diagnosis of  By: Julien Girt CMA, Marliss Czar    . Hearing loss 11/24/2014  . Heart murmur   . History of shingles 10/27/2017  . Hypercholesterolemia   . HYPERCHOLESTEROLEMIA 11/16/2007   Qualifier: Diagnosis of  By: Julien Girt CMA, Marliss Czar    . Ischemic cardiomyopathy    a. echo (09/05/13): EF 35%, diffuse HK worsened distal septal, mid/distal inferior and apical region, grade 1 diastolic dysfunction, mild LAE.    [name] Kidney stone 08/17/2011  . Loss of hearing   . Lumbar back pain   . Measles as a child  . Medicare annual wellness visit, subsequent 11/24/2014   Sees Dr Delman Cheadle for dermatology Sees Dr Roni Bread of Urology Sees Dr Stanford Breed of cardiology Sees Dr Virginia Rochester of Opthamology No further colonoscopies warranted       . Mumps as a child  . Nephrolithiasis   . Pain in joint, lower leg 07/29/2014  .  PERIPHERAL VASCULAR DISEASE 11/16/2007   Qualifier: Diagnosis of  By: Julien Girt CMA, Marliss Czar    . Peripheral vascular disease ([place])   . Rectal bleeding 03/07/2017  . Shingles 07/29/2014  . Sun-damaged skin 05/31/2014    Past Surgical History:  Procedure Laterality Date  . CORONARY ANGIOPLASTY WITH STENT PLACEMENT  04/18/2013   RCA       . CORONARY ARTERY BYPASS GRAFT  1979   x4 SVG-DIAG-LAD, SVG-OM-PDA  . CORONARY ARTERY BYPASS GRAFT  1993   Redo x5 by Dr Harlow Asa; [facility], SVG-OM, SVG-AM-PL  . Decompressive laminectomy  01/2006   L2 - scarum by Dr. Shellia Carwin  . HEMORRHOID SURGERY     fissure with hemorrhoid corrected at age 59  . INGUINAL HERNIA REPAIR  1994   Right by Dr Harlow Asa  . INGUINAL HERNIA REPAIR  1996   Left by Dr. Harlow Asa  . LEFT  HEART CATHETERIZATION WITH CORONARY ANGIOGRAM N/A 09/23/2013   Procedure: LEFT HEART CATHETERIZATION WITH CORONARY ANGIOGRAM;  Surgeon: Blane Ohara, MD;  Location: [facility] CATH LAB;  Service: Cardiovascular;  Laterality: N/A;  . lens implants     for vision correction  . PERCUTANEOUS CORONARY STENT INTERVENTION (PCI-S) N/A 04/18/2013   Procedure: PERCUTANEOUS CORONARY STENT INTERVENTION (PCI-S);  Surgeon: Sherren Mocha, MD;  Location: [facility] CATH LAB;  Service: Cardiovascular;  Laterality: N/A;  . TONSILLECTOMY      Family History  Problem Relation Age of Onset  . Parkinsonism Brother   . Diabetes Maternal Grandmother   . Depression Daughter   . Other Son        4 stents  . Heart disease Son   . Diabetes Son        type 2  . Colon cancer Neg Hx   . Esophageal cancer Neg Hx   . Rectal cancer Neg Hx   . Stomach cancer Neg Hx     Social History   Socioeconomic History  . Marital status: Widowed    Spouse name: [name] x 72 years  . Number of children: 7  . Years of education: Not on file  . Highest education level: Not on file  Occupational History  . 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 Smoking status: Former Smoker    Quit date: 11/28/1944    Years since quitting: 75.1  . Smokeless tobacco: Never Used  Substance and Sexual Activity  . Alcohol use: Yes    Alcohol/week: 2.0 standard drinks    Types: 2 Standard drinks or equivalent per week    Comment: daily rum  or wine  . Drug use: No  . Sexual activity: Not Currently    Comment: lives with wife, no dietary restrictions.   Other Topics Concern  . Not on file  Social History Narrative   Married   7 children   Social Determinants of Health   Financial Resource Strain:   . Difficulty of Paying Living Expenses: Not on file  Food Insecurity:   . Worried About Charity fundraiser in the Last Year: Not on file  . Ran Out of Food in the Last Year: Not on file  Transportation Needs:   .  Lack of Transportation (Medical): Not on file  . Lack of Transportation (Non-Medical): Not on file  Physical Activity:   . Days of Exercise per Week: Not on file  . Minutes of Exercise per Session: Not on file  Stress:   . Feeling of Stress : Not  on file  Social Connections:   . Frequency of Communication with Friends and Family: Not on file  . Frequency of Social Gatherings with Friends and Family: Not on file  . Attends Religious Services: Not on file  . Active Member of Clubs or Organizations: Not on file  . Attends Archivist Meetings: Not on file  . Marital Status: Not on file  Intimate Partner Violence:   . Fear of Current or Ex-Partner: Not on file  . Emotionally Abused: Not on file  . Physically Abused: Not on file  . Sexually Abused: Not on file    Outpatient Medications Prior to Visit  Medication Sig Dispense Refill  . allopurinol (ZYLOPRIM) 300 MG tablet Take 1 tablet (300 mg total) by mouth daily. 90 tablet 0  . aspirin EC 81 MG tablet Take 81 mg by mouth every morning.     . B Complex-C (B-COMPLEX WITH VITAMIN C) tablet Take 1 tablet by mouth daily.     . Cholecalciferol (VITAMIN D-3 PO) Take 5,000 Units by mouth daily with breakfast.     . famotidine (PEPCID) 20 MG tablet Take 1 tablet (20 mg total) by mouth 2 (two) times daily. 90 tablet 3  . finasteride (PROSCAR) 5 MG tablet Takes every third day    . fluticasone (FLONASE) 50 MCG/ACT nasal spray Place 2 sprays into both nostrils daily. 16 g 2  . folic acid (FOLVITE) A999333 MCG tablet Take 400 mcg by mouth 2 (two) times daily.     . isosorbide mononitrate (IMDUR) 30 MG 24 hr tablet Take 3 tablets (90 mg total) by mouth daily. 270 tablet 3  . losartan (COZAAR) 50 MG tablet TAKE 1 TABLET BY MOUTH ONCE DAILY .  MUST  KEEP  APPOINTMENT  FOR  REFILLS. 90 tablet 1  . metoprolol succinate (TOPROL-XL) 25 MG 24 hr tablet Take 1/2 (one-half) tablet by mouth once daily 45 tablet 0  . Misc Natural Products (OSTEO BI-FLEX  ADV JOINT SHIELD) TABS Take 1 tablet by mouth 2 (two) times daily.     . nitroGLYCERIN (NITROSTAT) 0.4 MG SL tablet Place 1 tablet (0.4 mg total) under the tongue every 5 (five) minutes as needed. For chest pain. 25 tablet 6  . simvastatin (ZOCOR) 40 MG tablet TAKE 1 TABLET BY MOUTH ONCE DAILY IN THE EVENING 90 tablet 1   No facility-administered medications prior to visit.    Allergies  Allergen Reactions  . Lisinopril     REACTION: dizziness  . Methocarbamol     REACTION: pt states "dizzy"  . Other   . Pregabalin     REACTION: pt states "dizzy"  . Ramipril     REACTION: hives and dizziness    Review of Systems  Constitutional: Positive for malaise/fatigue. Negative for fever.  HENT: Negative for congestion.   Eyes: Negative for blurred vision.  Respiratory: Negative for shortness of breath.   Cardiovascular: Negative for chest pain, palpitations and leg swelling.  Gastrointestinal: Negative for abdominal pain, blood in stool and nausea.  Genitourinary: Negative for dysuria and frequency.  Musculoskeletal: Negative for falls.  Skin: Negative for rash.  Neurological: Negative for dizziness, loss of consciousness and headaches.  Endo/Heme/Allergies: Negative for environmental allergies.  Psychiatric/Behavioral: Negative for depression. The patient is nervous/anxious.        Objective:    Physical Exam Vitals and nursing note reviewed.  Constitutional:      General: He is not in acute distress.    Appearance: He is  well-developed.  HENT:     Head: Normocephalic and atraumatic.     Nose: Nose normal.  Eyes:     General:        Right eye: No discharge.        Left eye: No discharge.  Cardiovascular:     Rate and Rhythm: Normal rate and regular rhythm.     Heart sounds: No murmur.  Pulmonary:     Effort: Pulmonary effort is normal.     Breath sounds: Normal breath sounds.  Abdominal:     General: Bowel sounds are normal.     Palpations: Abdomen is soft.      Tenderness: There is no abdominal tenderness.  Musculoskeletal:     Cervical back: Normal range of motion and neck supple.  Skin:    General: Skin is warm and dry.  Neurological:     Mental Status: He is alert and oriented to person, place, and time.     BP 120/60 (BP Location: Left Arm, Patient Position: Sitting, Cuff Size: Normal)   Pulse 74   Temp (!) 97.3 F (36.3 C) (Temporal)   Resp 18   Wt 162 lb 6.4 oz (73.7 kg)   SpO2 93%   BMI 22.03 kg/m  Wt Readings from Last 3 Encounters:  12/25/19 163 lb 3.2 oz (74 kg)  12/24/19 162 lb 6.4 oz (73.7 kg)  12/18/19 162 lb 8 oz (73.7 kg)    Diabetic Foot Exam - Simple   No data filed     Lab Results  Component Value Date   WBC 7.9 11/30/2019   HGB 13.2 11/30/2019   HCT 39.6 11/30/2019   PLT 231 11/30/2019   GLUCOSE 97 11/30/2019   CHOL 102 08/13/2019   TRIG 100.0 08/13/2019   HDL 40.30 08/13/2019   LDLCALC 42 08/13/2019   ALT 22 11/30/2019   AST 31 11/30/2019   NA 137 11/30/2019   K 4.0 11/30/2019   CL 106 11/30/2019   CREATININE 1.14 11/30/2019   BUN 24 (H) 11/30/2019   CO2 24 11/30/2019   TSH 2.32 08/13/2019   PSA 4.29 (H) 02/08/2011   INR 1.09 02/04/2018   HGBA1C 5.7 08/13/2019    Lab Results  Component Value Date   TSH 2.32 08/13/2019   Lab Results  Component Value Date   WBC 7.9 11/30/2019   HGB 13.2 11/30/2019   HCT 39.6 11/30/2019   MCV 98.3 11/30/2019   PLT 231 11/30/2019   Lab Results  Component Value Date   NA 137 11/30/2019   K 4.0 11/30/2019   CO2 24 11/30/2019   GLUCOSE 97 11/30/2019   BUN 24 (H) 11/30/2019   CREATININE 1.14 11/30/2019   BILITOT 0.5 11/30/2019   ALKPHOS 60 11/30/2019   AST 31 11/30/2019   ALT 22 11/30/2019   PROT 6.9 11/30/2019   ALBUMIN 4.0 11/30/2019   CALCIUM 9.3 11/30/2019   ANIONGAP 7 11/30/2019   GFR 72.17 08/13/2019   Lab Results  Component Value Date   CHOL 102 08/13/2019   Lab Results  Component Value Date   HDL 40.30 08/13/2019   Lab Results    Component Value Date   LDLCALC 42 08/13/2019   Lab Results  Component Value Date   TRIG 100.0 08/13/2019   Lab Results  Component Value Date   CHOLHDL 3 08/13/2019   Lab Results  Component Value Date   HGBA1C 5.7 08/13/2019       Assessment & Plan:   Problem List Items  Addressed This Visit    Essential hypertension    Well controlled, no changes to meds. Encouraged heart healthy diet such as the DASH diet and exercise as tolerated.       Anemia    Increase leafy greens, consider increased lean red meat and using cast iron cookware. Continue to monitor, report any concerns. Has improved on recent lab work      Hyperglycemia    hgba1c acceptable, minimize simple carbs. Increase exercise as tolerated      Grief reaction    He is still processing the loss of his wife dying recently. He is sad about how his wife passed away. They tried to care for her over the end of her life. She had a great deal of falls and trips to the ER. She ended up in a facility and he feels it was not good care.          I am having Maudie Flakes. [place] "Dick" maintain his folic acid, Osteo Bi-Flex Adv Joint Shield, Cholecalciferol (VITAMIN D-3 PO), aspirin EC, finasteride, nitroGLYCERIN, isosorbide mononitrate, losartan, B-complex with vitamin C, fluticasone, simvastatin, allopurinol, famotidine, and metoprolol succinate.  No orders of the defined types were placed in this encounter.    Penni Homans, MD

## 2019-12-24 NOTE — Assessment & Plan Note (Signed)
Increase leafy greens, consider increased lean red meat and using cast iron cookware. Continue to monitor, report any concerns. Has improved on recent lab work

## 2019-12-24 NOTE — Assessment & Plan Note (Signed)
hgba1c acceptable, minimize simple carbs. Increase exercise as tolerated.  

## 2019-12-24 NOTE — Patient Instructions (Signed)
Call with any concerns  COVID-19 COVID-19 is a respiratory infection that is caused by a virus called severe acute respiratory syndrome coronavirus 2 (SARS-CoV-2). The disease is also known as coronavirus disease or novel coronavirus. In some people, the virus may not cause any symptoms. In others, it may cause a serious infection. The infection can get worse quickly and can lead to complications, such as:  Pneumonia, or infection of the lungs.  Acute respiratory distress syndrome or ARDS. This is a condition in which fluid build-up in the lungs prevents the lungs from filling with air and passing oxygen into the blood.  Acute respiratory failure. This is a condition in which there is not enough oxygen passing from the lungs to the body or when carbon dioxide is not passing from the lungs out of the body.  Sepsis or septic shock. This is a serious bodily reaction to an infection.  Blood clotting problems.  Secondary infections due to bacteria or fungus.  Organ failure. This is when your body's organs stop working. The virus that causes COVID-19 is contagious. This means that it can spread from person to person through droplets from coughs and sneezes (respiratory secretions). What are the causes? This illness is caused by a virus. You may catch the virus by:  Breathing in droplets from an infected person. Droplets can be spread by a person breathing, speaking, singing, coughing, or sneezing.  Touching something, like a table or a doorknob, that was exposed to the virus (contaminated) and then touching your mouth, nose, or eyes. What increases the risk? Risk for infection You are more likely to be infected with this virus if you:  Are within 6 feet (2 meters) of a person with COVID-19.  Provide care for or live with a person who is infected with COVID-19.  Spend time in crowded indoor spaces or live in shared housing. Risk for serious illness You are more likely to become seriously  ill from the virus if you:  Are 84 years of age or older. The higher your age, the more you are at risk for serious illness.  Live in a nursing home or long-term care facility.  Have cancer.  Have a long-term (chronic) disease such as: ? Chronic lung disease, including chronic obstructive pulmonary disease or asthma. ? A long-term disease that lowers your body's ability to fight infection (immunocompromised). ? Heart disease, including heart failure, a condition in which the arteries that lead to the heart become narrow or blocked (coronary artery disease), a disease which makes the heart muscle thick, weak, or stiff (cardiomyopathy). ? Diabetes. ? Chronic kidney disease. ? Sickle cell disease, a condition in which red blood cells have an abnormal "sickle" shape. ? Liver disease.  Are obese. What are the signs or symptoms? Symptoms of this condition can range from mild to severe. Symptoms may appear any time from 2 to 14 days after being exposed to the virus. They include:  A fever or chills.  A cough.  Difficulty breathing.  Headaches, body aches, or muscle aches.  Runny or stuffy (congested) nose.  A sore throat.  New loss of taste or smell. Some people may also have stomach problems, such as nausea, vomiting, or diarrhea. Other people may not have any symptoms of COVID-19. How is this diagnosed? This condition may be diagnosed based on:  Your signs and symptoms, especially if: ? You live in an area with a COVID-19 outbreak. ? You recently traveled to or from an area where the  virus is common. ? You provide care for or live with a person who was diagnosed with COVID-19. ? You were exposed to a person who was diagnosed with COVID-19.  A physical exam.  Lab tests, which may include: ? Taking a sample of fluid from the back of your nose and throat (nasopharyngeal fluid), your nose, or your throat using a swab. ? A sample of mucus from your lungs (sputum). ? Blood  tests.  Imaging tests, which may include, X-rays, CT scan, or ultrasound. How is this treated? At present, there is no medicine to treat COVID-19. Medicines that treat other diseases are being used on a trial basis to see if they are effective against COVID-19. Your health care provider will talk with you about ways to treat your symptoms. For most people, the infection is mild and can be managed at home with rest, fluids, and over-the-counter medicines. Treatment for a serious infection usually takes places in a hospital intensive care unit (ICU). It may include one or more of the following treatments. These treatments are given until your symptoms improve.  Receiving fluids and medicines through an IV.  Supplemental oxygen. Extra oxygen is given through a tube in the nose, a face mask, or a hood.  Positioning you to lie on your stomach (prone position). This makes it easier for oxygen to get into the lungs.  Continuous positive airway pressure (CPAP) or bi-level positive airway pressure (BPAP) machine. This treatment uses mild air pressure to keep the airways open. A tube that is connected to a motor delivers oxygen to the body.  Ventilator. This treatment moves air into and out of the lungs by using a tube that is placed in your windpipe.  Tracheostomy. This is a procedure to create a hole in the neck so that a breathing tube can be inserted.  Extracorporeal membrane oxygenation (ECMO). This procedure gives the lungs a chance to recover by taking over the functions of the heart and lungs. It supplies oxygen to the body and removes carbon dioxide. Follow these instructions at home: Lifestyle  If you are sick, stay home except to get medical care. Your health care provider will tell you how long to stay home. Call your health care provider before you go for medical care.  Rest at home as told by your health care provider.  Do not use any products that contain nicotine or tobacco, such as  cigarettes, e-cigarettes, and chewing tobacco. If you need help quitting, ask your health care provider.  Return to your normal activities as told by your health care provider. Ask your health care provider what activities are safe for you. General instructions  Take over-the-counter and prescription medicines only as told by your health care provider.  Drink enough fluid to keep your urine pale yellow.  Keep all follow-up visits as told by your health care provider. This is important. How is this prevented?  There is no vaccine to help prevent COVID-19 infection. However, there are steps you can take to protect yourself and others from this virus. To protect yourself:   Do not travel to areas where COVID-19 is a risk. The areas where COVID-19 is reported change often. To identify high-risk areas and travel restrictions, check the CDC travel website: FatFares.com.br  If you live in, or must travel to, an area where COVID-19 is a risk, take precautions to avoid infection. ? Stay away from people who are sick. ? Wash your hands often with soap and water for 20  seconds. If soap and water are not available, use an alcohol-based hand sanitizer. ? Avoid touching your mouth, face, eyes, or nose. ? Avoid going out in public, follow guidance from your state and local health authorities. ? If you must go out in public, wear a cloth face covering or face mask. Make sure your mask covers your nose and mouth. ? Avoid crowded indoor spaces. Stay at least 6 feet (2 meters) away from others. ? Disinfect objects and surfaces that are frequently touched every day. This may include:  Counters and tables.  Doorknobs and light switches.  Sinks and faucets.  Electronics, such as phones, remote controls, keyboards, computers, and tablets. To protect others: If you have symptoms of COVID-19, take steps to prevent the virus from spreading to others.  If you think you have a COVID-19  infection, contact your health care provider right away. Tell your health care team that you think you may have a COVID-19 infection.  Stay home. Leave your house only to seek medical care. Do not use public transport.  Do not travel while you are sick.  Wash your hands often with soap and water for 20 seconds. If soap and water are not available, use alcohol-based hand sanitizer.  Stay away from other members of your household. Let healthy household members care for children and pets, if possible. If you have to care for children or pets, wash your hands often and wear a mask. If possible, stay in your own room, separate from others. Use a different bathroom.  Make sure that all people in your household wash their hands well and often.  Cough or sneeze into a tissue or your sleeve or elbow. Do not cough or sneeze into your hand or into the air.  Wear a cloth face covering or face mask. Make sure your mask covers your nose and mouth. Where to find more information  Centers for Disease Control and Prevention: PurpleGadgets.be  World Health Organization: https://www.castaneda.info/ Contact a health care provider if:  You live in or have traveled to an area where COVID-19 is a risk and you have symptoms of the infection.  You have had contact with someone who has COVID-19 and you have symptoms of the infection. Get help right away if:  You have trouble breathing.  You have pain or pressure in your chest.  You have confusion.  You have bluish lips and fingernails.  You have difficulty waking from sleep.  You have symptoms that get worse. These symptoms may represent a serious problem that is an emergency. Do not wait to see if the symptoms will go away. Get medical help right away. Call your local emergency services (911 in the [place] Do not drive yourself to the hospital. Let the emergency medical personnel know if you think you have  COVID-19. Summary  COVID-19 is a respiratory infection that is caused by a virus. It is also known as coronavirus disease or novel coronavirus. It can cause serious infections, such as pneumonia, acute respiratory distress syndrome, acute respiratory failure, or sepsis.  The virus that causes COVID-19 is contagious. This means that it can spread from person to person through droplets from breathing, speaking, singing, coughing, or sneezing.  You are more likely to develop a serious illness if you are 67 years of age or older, have a weak immune system, live in a nursing home, or have chronic disease.  There is no medicine to treat COVID-19. Your health care provider will talk with you about  ways to treat your symptoms.  Take steps to protect yourself and others from infection. Wash your hands often and disinfect objects and surfaces that are frequently touched every day. Stay away from people who are sick and wear a mask if you are sick. This information is not intended to replace advice given to you by your health care provider. Make sure you discuss any questions you have with your health care provider. Document Revised: 09/13/2019 Document Reviewed: 12/20/2018 Elsevier Patient Education  [place].

## 2019-12-24 NOTE — Assessment & Plan Note (Signed)
He is still processing the loss of his wife dying recently. He is sad about how his wife passed away. They tried to care for her over the end of her life. She had a great deal of falls and trips to the ER. She ended up in a facility and he feels it was not good care.

## 2019-12-25 ENCOUNTER — Ambulatory Visit (AMBULATORY_SURGERY_CENTER): Payer: Self-pay | Admitting: *Deleted

## 2019-12-25 ENCOUNTER — Other Ambulatory Visit: Payer: Self-pay

## 2019-12-25 ENCOUNTER — Telehealth: Payer: Self-pay | Admitting: *Deleted

## 2019-12-25 ENCOUNTER — Ambulatory Visit: Payer: Medicare Other | Admitting: Cardiology

## 2019-12-25 VITALS — Temp 97.1°F | Ht 72.0 in | Wt 163.2 lb

## 2019-12-25 DIAGNOSIS — K921 Melena: Secondary | ICD-10-CM

## 2019-12-25 DIAGNOSIS — Z01818 Encounter for other preprocedural examination: Secondary | ICD-10-CM

## 2019-12-25 NOTE — Telephone Encounter (Signed)
Daniel Reeves,  This pt is cleared for anesthetic care at [facility].  Thanks much,  Osvaldo Angst

## 2019-12-25 NOTE — Telephone Encounter (Signed)
John,  Could you please review this pt's chart?  I just wanted to make sure he is ok for [facility].  Thank you, Cyril Mourning

## 2019-12-25 NOTE — Progress Notes (Signed)
Pt is aware that care partner will wait in the car during procedure; if they feel like they will be too hot or cold to wait in the car; they may wait in the 4 th floor lobby. Patient is aware to bring only one care partner. We want them to wear a mask (we do not have any that we can provide them), practice social distancing, and we will check their temperatures when they get here.  I did remind the patient that their care partner needs to stay in the parking lot the entire time and have a cell phone available, we will call them when the pt is ready for discharge. Patient will wear mask into building.   No egg or soy allergy  No home oxygen use or problems with anesthesia  No medications for weight loss taken  emmi information given  covid test 01-07-20 at 10:30 a.m.

## 2019-12-26 ENCOUNTER — Telehealth: Payer: Self-pay | Admitting: Gastroenterology

## 2019-12-26 NOTE — Telephone Encounter (Signed)
All prep questions answered!

## 2019-12-27 ENCOUNTER — Telehealth: Payer: Self-pay

## 2019-12-27 NOTE — Telephone Encounter (Signed)
Osvaldo Angst, CRNA  Etheleen Nicks, RN  Di Kindle,   This pt is cleared for anesthetic care at [facility].   Thanks,   Osvaldo Angst       Previous Messages   ----- Message -----  From: Etheleen Nicks, RN  Sent: 12/20/2019  8:57 AM EST  To: Osvaldo Angst, CRNA  Subject: please review chart                Hi John,  Pt's last echo was 2018 which stated "improved" EF of 35-40%, please review chart and let us know if he is ok for [facility], thanks, [employer]

## 2019-12-27 NOTE — Telephone Encounter (Signed)
-----   Message from Osvaldo Angst, CRNA sent at 12/20/2019  9:11 AM EST ----- Regarding: RE: please review chart Di Kindle,  This pt is cleared for anesthetic care at [facility].   Thanks,  Osvaldo Angst ----- Message ----- From: Etheleen Nicks, RN Sent: 12/20/2019   8:57 AM EST To: Osvaldo Angst, CRNA Subject: please review chart                            Hi John,  Pt's last echo was 2018 which stated "improved" EF of 35-40%, please review chart and let us know if he is ok for [place], thanks, [employer]

## 2019-12-30 ENCOUNTER — Other Ambulatory Visit: Payer: Medicare Other | Admitting: Gastroenterology

## 2019-12-31 ENCOUNTER — Other Ambulatory Visit: Payer: Self-pay | Admitting: Family Medicine

## 2020-01-01 NOTE — Progress Notes (Signed)
HPI: FU CAD; s/p CABG in 1979 and 1993, ischemic CM, systolic CHF, AAA, HTN, HL. Patient underwent cardiac catheterization in May of 2014. The left main, LAD, circumflex and RCA were occluded. The LIMA to the LAD was patent and the distal LAD was occluded after the insertion. Saphenous vein graft to the obtuse marginal was occluded. Saphenous vein graft to the acute marginal and PDA had a high-grade lesion prior to insertion into the PDA of 80-90%. Ejection fraction was 25%. PCI: Promus Premier (3.5x12 mm) DES to the [facility].Repeat catheterization in October 2014 because of recurrent chest pain. The stent placed in the saphenous vein graft to the PDA had mild in-stent restenosis. Medical therapy recommended. Nuclear study 2/17 showed EF 35, inferolateral scar, no ischemia.Last echocardiogram February 2018 showed ejection fraction [id number], grade 1 diastolic dysfunction, mild to moderate aortic insufficiency, mild mitral regurgitation. Carotid Dopplers February 2018 showed less than 50% bilateral stenosis.Abdominal ultrasound 8/20 showed 3.5 cm abdominal aortic aneurysm; distal vessel not well visualized. Patient seen with chest pain in the emergency room January 2021.  Troponins not consistent with acute coronary syndrome.  Pain felt likely secondary to musculoskeletal etiology.  Since he was last seen,the patient denies any dyspnea on exertion, orthopnea, PND, pedal edema, palpitations, syncope or chest pain.   Current Outpatient Medications  Medication Sig Dispense Refill  . allopurinol (ZYLOPRIM) 300 MG tablet Take 1 tablet by mouth once daily 90 tablet 0  . aspirin EC 81 MG tablet Take 81 mg by mouth every morning.     . B Complex-C (B-COMPLEX WITH VITAMIN C) tablet Take 1 tablet by mouth daily.     . Cholecalciferol (VITAMIN D-3 PO) Take 5,000 Units by mouth daily with breakfast.     . famotidine (PEPCID) 20 MG tablet Take 1 tablet (20 mg total) by mouth 2 (two) times daily. 90 tablet 3  .  finasteride (PROSCAR) 5 MG tablet Takes every third day    . fluticasone (FLONASE) 50 MCG/ACT nasal spray Place 2 sprays into both nostrils daily. 16 g 2  . folic acid (FOLVITE) A999333 MCG tablet Take 400 mcg by mouth 2 (two) times daily.     . isosorbide mononitrate (IMDUR) 30 MG 24 hr tablet Take 3 tablets (90 mg total) by mouth daily. 270 tablet 3  . losartan (COZAAR) 50 MG tablet TAKE 1 TABLET BY MOUTH ONCE DAILY .  MUST  KEEP  APPOINTMENT  FOR  REFILLS. 90 tablet 1  . metoprolol succinate (TOPROL-XL) 25 MG 24 hr tablet Take 1/2 (one-half) tablet by mouth once daily 45 tablet 0  . Misc Natural Products (OSTEO BI-FLEX ADV JOINT SHIELD) TABS Take 1 tablet by mouth 2 (two) times daily.     . nitroGLYCERIN (NITROSTAT) 0.4 MG SL tablet Place 1 tablet (0.4 mg total) under the tongue every 5 (five) minutes as needed. For chest pain. 25 tablet 6  . simvastatin (ZOCOR) 40 MG tablet TAKE 1 TABLET BY MOUTH ONCE DAILY IN THE EVENING 90 tablet 1   No current facility-administered medications for this visit.     Past Medical History:  Diagnosis Date  . Abdominal aortic aneurysm ([place])    a. [place] (1/14):  3.3 x 3.4 cm => f/u 11/2013  . Amebic dysentery   . AMEBIC DYSENTERY 11/19/2007   Qualifier: History of  By: Lenna Gilford MD, Deborra Medina   . Anemia 03/07/2017  . ANXIETY 11/19/2007   Qualifier: Diagnosis of  By: Lenna Gilford MD, Deborra Medina   .  Arthritis 03/07/2017  . Atherosclerosis of coronary artery bypass graft with unstable angina pectoris ([place]) 04/19/2013  . BACK PAIN, LUMBAR 11/16/2007   Qualifier: Diagnosis of  By: Julien Girt CMA, Leigh    . Benign prostatic hypertrophy   . BENIGN PROSTATIC HYPERTROPHY, HX OF 11/16/2007   Qualifier: Diagnosis of  By: Julien Girt CMA, Leigh    . BRBPR (bright red blood per rectum) 11/01/2016  . CAD (coronary artery disease)    a. s/p CABG in 1979 and 1993;  b. LHC (5/14):  LM, LAD, CFX and RCA occluded; L-LAD ok, dLAD occluded after insertion of LIMA, S-OM occluded, S-PDA/AM 80-90 => PCI with  Promus DES; EF 25%  . Cardiomyopathy, ischemic 06/05/2013  . Cerumen impaction    Bilateral  . Chicken pox as a child  . Chronic systolic CHF (congestive heart failure) ([place])   . COLONIC POLYPS 07/01/2008   Qualifier: Diagnosis of  By: Lenna Gilford MD, Deborra Medina   . Degenerative joint disease   . DEGENERATIVE JOINT DISEASE 11/16/2007   Qualifier: Diagnosis of  By: Julien Girt CMA, Marliss Czar    . Diverticulosis of colon   . DIVERTICULOSIS OF COLON 07/01/2008   Qualifier: Diagnosis of  By: Lenna Gilford MD, Deborra Medina   . Double vision 03/07/2017  . Essential hypertension 11/16/2007   Qualifier: Diagnosis of  By: Julien Girt CMA, Marliss Czar    . Fingernail abnormalities 03/07/2017  . FLANK PAIN, RIGHT 02/03/2010   Qualifier: History of  By: Lenna Gilford MD, Deborra Medina   . GERD (gastroesophageal reflux disease)   . GOUT 11/16/2007   Qualifier: Diagnosis of  By: Julien Girt CMA, Marliss Czar    . Hearing loss 11/24/2014  . Heart murmur   . History of shingles 10/27/2017  . Hypercholesterolemia   . HYPERCHOLESTEROLEMIA 11/16/2007   Qualifier: Diagnosis of  By: Julien Girt CMA, Marliss Czar    . Ischemic cardiomyopathy    a. echo (09/05/13): EF 35%, diffuse HK worsened distal septal, mid/distal inferior and apical region, grade 1 diastolic dysfunction, mild LAE.    [name] Kidney stone 08/17/2011  . Loss of hearing   . Lumbar back pain   . Measles as a child  . Medicare annual wellness visit, subsequent 11/24/2014   Sees Dr Delman Cheadle for dermatology Sees Dr Roni Bread of Urology Sees Dr Stanford Breed of cardiology Sees Dr Virginia Rochester of Opthamology No further colonoscopies warranted       . Mumps as a child  . Nephrolithiasis   . Pain in joint, lower leg 07/29/2014  . PERIPHERAL VASCULAR DISEASE 11/16/2007   Qualifier: Diagnosis of  By: Julien Girt CMA, Marliss Czar    . Peripheral vascular disease ([place])   . Rectal bleeding 03/07/2017  . Shingles 07/29/2014  . Sun-damaged skin 05/31/2014    Past Surgical History:  Procedure Laterality Date  . CORONARY ANGIOPLASTY WITH STENT PLACEMENT  04/18/2013   RCA        . CORONARY ARTERY BYPASS GRAFT  1979   x4 SVG-DIAG-LAD, SVG-OM-PDA  . CORONARY ARTERY BYPASS GRAFT  1993   Redo x5 by Dr Harlow Asa; [facility], SVG-OM, SVG-AM-PL  . Decompressive laminectomy  01/2006   L2 - scarum by Dr. Shellia Carwin  . HEMORRHOID SURGERY     fissure with hemorrhoid corrected at age 51  . INGUINAL HERNIA REPAIR  1994   Right by Dr Harlow Asa  . INGUINAL HERNIA REPAIR  1996   Left by Dr. Harlow Asa  . LEFT HEART CATHETERIZATION WITH CORONARY ANGIOGRAM N/A 09/23/2013   Procedure: LEFT HEART CATHETERIZATION WITH CORONARY ANGIOGRAM;  Surgeon: Legrand Como  Ree Kida, MD;  Location: [facility] CATH LAB;  Service: Cardiovascular;  Laterality: N/A;  . lens implants     for vision correction  . PERCUTANEOUS CORONARY STENT INTERVENTION (PCI-S) N/A 04/18/2013   Procedure: PERCUTANEOUS CORONARY STENT INTERVENTION (PCI-S);  Surgeon: Sherren Mocha, MD;  Location: [facility] CATH LAB;  Service: Cardiovascular;  Laterality: N/A;  . TONSILLECTOMY      Social History   Socioeconomic History  . Marital status: Widowed    Spouse name: [name] x 72 years  . Number of children: 7  . Years of education: Not on file  . Highest education level: Not on file  Occupational History  . 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 Smoking status: Former Smoker    Quit date: 11/28/1944    Years since quitting: 75.1  . Smokeless tobacco: Never Used  Substance and Sexual Activity  . Alcohol use: Yes    Alcohol/week: 2.0 standard drinks    Types: 2 Standard drinks or equivalent per week    Comment: daily rum  or wine  . Drug use: No  . Sexual activity: Not Currently    Comment: lives with wife, no dietary restrictions.   Other Topics Concern  . Not on file  Social History Narrative   Married   7 children   Social Determinants of Health   Financial Resource Strain:   . Difficulty of Paying Living Expenses: Not on file  Food Insecurity:   . Worried About Charity fundraiser in the  Last Year: Not on file  . Ran Out of Food in the Last Year: Not on file  Transportation Needs:   . Lack of Transportation (Medical): Not on file  . Lack of Transportation (Non-Medical): Not on file  Physical Activity:   . Days of Exercise per Week: Not on file  . Minutes of Exercise per Session: Not on file  Stress:   . Feeling of Stress : Not on file  Social Connections:   . Frequency of Communication with Friends and Family: Not on file  . Frequency of Social Gatherings with Friends and Family: Not on file  . Attends Religious Services: Not on file  . Active Member of Clubs or Organizations: Not on file  . Attends Archivist Meetings: Not on file  . Marital Status: Not on file  Intimate Partner Violence:   . Fear of Current or Ex-Partner: Not on file  . Emotionally Abused: Not on file  . Physically Abused: Not on file  . Sexually Abused: Not on file    Family History  Problem Relation Age of Onset  . Parkinsonism Brother   . Diabetes Maternal Grandmother   . Depression Daughter   . Other Son        4 stents  . Heart disease Son   . Diabetes Son        type 2  . Colon cancer Neg Hx   . Esophageal cancer Neg Hx   . Rectal cancer Neg Hx   . Stomach cancer Neg Hx     ROS: no fevers or chills, productive cough, hemoptysis, dysphasia, odynophagia, melena, hematochezia, dysuria, hematuria, rash, seizure activity, orthopnea, PND, pedal edema, claudication. Remaining systems are negative.  Physical Exam: Well-developed well-nourished in no acute distress.  Skin is warm and dry.  HEENT is normal.  Neck is supple.  Chest is clear to auscultation with normal expansion.  Cardiovascular exam is regular rate and rhythm.  Abdominal exam nontender or distended. No masses palpated. Extremities show no edema. neuro grossly intact  ECG-sinus bradycardia at a rate of 54, left bundle branch block.  Personally reviewed  A/P  1 coronary artery disease-Continue aspirin and  statin.  Had recent episode of chest pain felt likely musculoskeletal in etiology.  2 hypertension-patient's blood pressure is controlled today.  Continue present medications and follow.  3 hyperlipidemia-continue statin.  4 ischemic cardiomyopathy-no signs of congestive heart failure on exam.  Continue ARB and beta-blocker.  5 abdominal aortic aneurysm-follow-up ultrasound August 2021.  Kirk Ruths, MD

## 2020-01-07 ENCOUNTER — Other Ambulatory Visit: Payer: Self-pay | Admitting: Gastroenterology

## 2020-01-07 ENCOUNTER — Other Ambulatory Visit: Payer: Self-pay

## 2020-01-07 ENCOUNTER — Telehealth: Payer: Self-pay | Admitting: Gastroenterology

## 2020-01-07 ENCOUNTER — Ambulatory Visit (INDEPENDENT_AMBULATORY_CARE_PROVIDER_SITE_OTHER): Payer: Medicare Other

## 2020-01-07 DIAGNOSIS — Z1159 Encounter for screening for other viral diseases: Secondary | ICD-10-CM | POA: Diagnosis not present

## 2020-01-07 LAB — SARS CORONAVIRUS 2 (TAT 6-24 HRS): SARS Coronavirus 2: NEGATIVE

## 2020-01-07 NOTE — Telephone Encounter (Signed)
Answered patient prep questions at this time.

## 2020-01-07 NOTE — Telephone Encounter (Signed)
Patient has questions about procedure on Friday. Please call him.

## 2020-01-08 ENCOUNTER — Other Ambulatory Visit: Payer: Self-pay

## 2020-01-08 ENCOUNTER — Ambulatory Visit (INDEPENDENT_AMBULATORY_CARE_PROVIDER_SITE_OTHER): Payer: Medicare Other | Admitting: Cardiology

## 2020-01-08 ENCOUNTER — Encounter: Payer: Self-pay | Admitting: Cardiology

## 2020-01-08 VITALS — BP 132/64 | HR 54 | Ht 72.0 in | Wt 164.8 lb

## 2020-01-08 DIAGNOSIS — I255 Ischemic cardiomyopathy: Secondary | ICD-10-CM

## 2020-01-08 DIAGNOSIS — I10 Essential (primary) hypertension: Secondary | ICD-10-CM | POA: Diagnosis not present

## 2020-01-08 DIAGNOSIS — E7800 Pure hypercholesterolemia, unspecified: Secondary | ICD-10-CM

## 2020-01-08 DIAGNOSIS — I2510 Atherosclerotic heart disease of native coronary artery without angina pectoris: Secondary | ICD-10-CM

## 2020-01-08 DIAGNOSIS — I714 Abdominal aortic aneurysm, without rupture, unspecified: Secondary | ICD-10-CM

## 2020-01-08 NOTE — Patient Instructions (Signed)
Medication Instructions:  NO CHANGE *If you need a refill on your cardiac medications before your next appointment, please call your pharmacy*  Lab Work: If you have labs (blood work) drawn today and your tests are completely normal, you will receive your results only by: . MyChart Message (if you have MyChart) OR . A paper copy in the mail If you have any lab test that is abnormal or we need to change your treatment, we will call you to review the results.  Follow-Up: At [facility], you and your health needs are our priority.  As part of our continuing mission to provide you with exceptional heart care, we have created designated Provider Care Teams.  These Care Teams include your primary Cardiologist (physician) and Advanced Practice Providers (APPs -  Physician Assistants and Nurse Practitioners) who all work together to provide you with the care you need, when you need it.  Your next appointment:   6 month(s)  The format for your next appointment:   Either In Person or Virtual  Provider:   Brian Crenshaw, MD   

## 2020-01-10 ENCOUNTER — Encounter: Payer: Self-pay | Admitting: Gastroenterology

## 2020-01-10 ENCOUNTER — Ambulatory Visit (AMBULATORY_SURGERY_CENTER): Payer: Medicare Other | Admitting: Gastroenterology

## 2020-01-10 ENCOUNTER — Other Ambulatory Visit: Payer: Self-pay

## 2020-01-10 VITALS — BP 118/54 | HR 46 | Temp 97.5°F | Resp 16 | Ht 72.0 in | Wt 163.2 lb

## 2020-01-10 DIAGNOSIS — K921 Melena: Secondary | ICD-10-CM

## 2020-01-10 DIAGNOSIS — I2510 Atherosclerotic heart disease of native coronary artery without angina pectoris: Secondary | ICD-10-CM | POA: Diagnosis not present

## 2020-01-10 DIAGNOSIS — K6289 Other specified diseases of anus and rectum: Secondary | ICD-10-CM | POA: Diagnosis not present

## 2020-01-10 DIAGNOSIS — R197 Diarrhea, unspecified: Secondary | ICD-10-CM | POA: Diagnosis not present

## 2020-01-10 DIAGNOSIS — I10 Essential (primary) hypertension: Secondary | ICD-10-CM | POA: Diagnosis not present

## 2020-01-10 DIAGNOSIS — D125 Benign neoplasm of sigmoid colon: Secondary | ICD-10-CM | POA: Diagnosis not present

## 2020-01-10 DIAGNOSIS — K5730 Diverticulosis of large intestine without perforation or abscess without bleeding: Secondary | ICD-10-CM | POA: Diagnosis not present

## 2020-01-10 DIAGNOSIS — K640 First degree hemorrhoids: Secondary | ICD-10-CM

## 2020-01-10 DIAGNOSIS — K219 Gastro-esophageal reflux disease without esophagitis: Secondary | ICD-10-CM | POA: Diagnosis not present

## 2020-01-10 DIAGNOSIS — K602 Anal fissure, unspecified: Secondary | ICD-10-CM

## 2020-01-10 DIAGNOSIS — K635 Polyp of colon: Secondary | ICD-10-CM

## 2020-01-10 MED ORDER — AMBULATORY NON FORMULARY MEDICATION: 30.0000 g | Freq: Two times a day (BID) | 1 refills | Status: DC

## 2020-01-10 MED ORDER — SODIUM CHLORIDE 0.9 % IV SOLN: 500.0000 mL | Freq: Once | INTRAVENOUS | Status: DC

## 2020-01-10 NOTE — Progress Notes (Signed)
Called to room to assist during endoscopic procedure.  Patient ID and intended procedure confirmed with present staff. Received instructions for my participation in the procedure from the performing physician.  

## 2020-01-10 NOTE — Progress Notes (Signed)
Temp  LC  VS  DT  Pt's states no medical or surgical changes since previsit or office visit.    

## 2020-01-10 NOTE — Progress Notes (Signed)
Report to PACU, RN, vss, BBS= Clear.  

## 2020-01-10 NOTE — Patient Instructions (Signed)
YOU HAD AN ENDOSCOPIC PROCEDURE TODAY AT THE [facility]:   Refer to the procedure report that was given to you for any specific questions about what was found during the examination.  If the procedure report does not answer your questions, please call your gastroenterologist to clarify.  If you requested that your care partner not be given the details of your procedure findings, then the procedure report has been included in a sealed envelope for you to review at your convenience later.  YOU SHOULD EXPECT: Some feelings of bloating in the abdomen. Passage of more gas than usual.  Walking can help get rid of the air that was put into your GI tract during the procedure and reduce the bloating. If you had a lower endoscopy (such as a colonoscopy or flexible sigmoidoscopy) you may notice spotting of blood in your stool or on the toilet paper. If you underwent a bowel prep for your procedure, you may not have a normal bowel movement for a few days.  Please Note:  You might notice some irritation and congestion in your nose or some drainage.  This is from the oxygen used during your procedure.  There is no need for concern and it should clear up in a day or so.  SYMPTOMS TO REPORT IMMEDIATELY:   Following lower endoscopy (colonoscopy or flexible sigmoidoscopy):  Excessive amounts of blood in the stool  Significant tenderness or worsening of abdominal pains  Swelling of the abdomen that is new, acute  Fever of 100F or higher  For urgent or emergent issues, a gastroenterologist can be reached at any hour by calling [phone].   DIET:  We do recommend a small meal at first, but then you may proceed to your regular diet.  Drink plenty of fluids but you should avoid alcoholic beverages for 24 hours.  ACTIVITY:  You should plan to take it easy for the rest of today and you should NOT DRIVE or use heavy machinery until tomorrow (because of the sedation medicines used during the test).     FOLLOW UP: Our staff will call the number listed on your records 48-72 hours following your procedure to check on you and address any questions or concerns that you may have regarding the information given to you following your procedure. If we do not reach you, we will leave a message.  We will attempt to reach you two times.  During this call, we will ask if you have developed any symptoms of COVID 19. If you develop any symptoms (ie: fever, flu-like symptoms, shortness of breath, cough etc.) before then, please call [phone].  If you test positive for Covid 19 in the 2 weeks post procedure, please call and report this information to us.    If any biopsies were taken you will be contacted by phone or by letter within the next 1-3 weeks.  Please call us at [phone] if you have not heard about the biopsies in 3 weeks.    SIGNATURES/CONFIDENTIALITY: You and/or your care partner have signed paperwork which will be entered into your electronic medical record.  These signatures attest to the fact that that the information above on your After Visit Summary has been reviewed and is understood.  Full responsibility of the confidentiality of this discharge information lies with you and/or your care-partner. 

## 2020-01-10 NOTE — Op Note (Signed)
[place] Patient Name: Daniel Reeves Procedure Date: 01/10/2020 2:38 PM MRN: [id number] Endoscopist: Gerrit Heck , MD Age: 86 Referring MD:  Date of Birth: Apr 19, 1924 Gender: Male Account #: [id number] Procedure:                Flexible Sigmoidoscopy Indications:              Hematochezia Medicines:                Monitored Anesthesia Care Procedure:                Pre-Anesthesia Assessment:                           - Prior to the procedure, a History and Physical                            was performed, and patient medications and                            allergies were reviewed. The patient's tolerance of                            previous anesthesia was also reviewed. The risks                            and benefits of the procedure and the sedation                            options and risks were discussed with the patient.                            All questions were answered, and informed consent                            was obtained. Prior Anticoagulants: The patient has                            taken no previous anticoagulant or antiplatelet                            agents. ASA Grade Assessment: III - A patient with                            severe systemic disease. After reviewing the risks                            and benefits, the patient was deemed in                            satisfactory condition to undergo the procedure.                           After obtaining informed consent, the scope was  passed under direct vision. The Colonoscope was                            introduced through the anus and advanced to the the                            left transverse colon. The flexible sigmoidoscopy                            was accomplished without difficulty. The patient                            tolerated the procedure well. The quality of the                            bowel preparation was adequate. Scope  In: Scope Out: Findings:                 A small, internal anal fissure was found on                            perianal exam.                           A 8 mm polyp was found in the sigmoid colon. The                            polyp was sessile. The polyp was removed with a                            cold snare. Resection and retrieval were complete.                            Estimated blood loss was minimal.                           Many small and large-mouthed diverticula were found                            in the sigmoid colon.                           A localized area of mildly erythematous mucosa was                            found in the recto-sigmoid colon, located 20 cm                            from the anal verge. This was approximately 2-3 cm                            in length and co-located with dense diverticulosis.  Biopsies were taken with a cold forceps for                            histology. Estimated blood loss was minimal.                           Non-bleeding internal hemorrhoids were found during                            retroflexion. The hemorrhoids were medium-sized.                           A localized area of mildly atypical, somewhat                            granular mucosa was found in the distal rectum at                            the anal verge. This was overlying a hemorrhoid                            column. Surface biopsies were taken with a cold                            forceps for histology. Estimated blood loss was                            minimal. Complications:            No immediate complications. Estimated Blood Loss:     Estimated blood loss was minimal. Impression:               - Small internal anal fissure found on perianal                            exam.                           - One 8 mm polyp in the sigmoid colon, removed with                            a cold snare. Resected and  retrieved.                           - Diverticulosis in the sigmoid colon.                           - Erythematous mucosa in the recto-sigmoid colon.                            Biopsied.                           - Non-bleeding internal hemorrhoids.                           -  A localized area of mildly atypical, somewhat                            granular mucosa was found in the distal rectum at                            the anal verge. Biopsied. Recommendation:           - Discharge patient to home (with escort).                           - Resume previous diet today.                           - Continue present medications.                           - Await pathology results.                           - Start topical NTG 0.125%. Apply a small pea-sized                            amount to the affected area BID for 6 weeks. Avoid                            strenuous activity, exercise, etc, within 30                            minutes of application.                           - Depending on response to therapy and biopsy                            results, if hematochezia persists, can plan for                            either hemorrhoid banding or referral to [facility]                            Surgery. Gerrit Heck, MD 01/10/2020 3:21:30 PM

## 2020-01-14 ENCOUNTER — Telehealth: Payer: Self-pay

## 2020-01-14 NOTE — Telephone Encounter (Signed)
  Follow up Call-  Call back number 01/10/2020  Post procedure Call Back phone  # [phone]  Permission to leave phone message Yes  Some recent data might be hidden     Patient questions:  Do you have a fever, pain , or abdominal swelling? No. Pain Score  0 *  Have you tolerated food without any problems? Yes.    Have you been able to return to your normal activities? Yes.    Do you have any questions about your discharge instructions: Diet   No. Medications  No. Follow up visit  No.  Do you have questions or concerns about your Care? No.  Actions: * If pain score is 4 or above: No action needed, pain <4.    1. Have you developed a fever since your procedure? No  2.   Have you had an respiratory symptoms (SOB or cough) since your procedure? No  3.   Have you tested positive for COVID 19 since your procedure No  4.   Have you had any family members/close contacts diagnosed with the COVID 19 since your procedure?  No   If yes to any of these questions please route to Joylene John, RN and Alphonsa Gin, RN.

## 2020-01-29 DIAGNOSIS — N401 Enlarged prostate with lower urinary tract symptoms: Secondary | ICD-10-CM | POA: Diagnosis not present

## 2020-01-29 DIAGNOSIS — R351 Nocturia: Secondary | ICD-10-CM | POA: Diagnosis not present

## 2020-01-30 ENCOUNTER — Ambulatory Visit: Payer: Medicare Other | Attending: Internal Medicine

## 2020-01-30 DIAGNOSIS — Z23 Encounter for immunization: Secondary | ICD-10-CM | POA: Insufficient documentation

## 2020-01-30 NOTE — Progress Notes (Signed)
   Covid-19 Vaccination Clinic  Name:  Daniel Reeves    MRN: [id number] DOB: 03/01/1924  01/30/2020  [name] was observed post Covid-19 immunization for 15 minutes without incident. He was provided with Vaccine Information Sheet and instruction to access the V-Safe system.   [name] was instructed to call 911 with any severe reactions post vaccine: [name] Difficulty breathing  . Swelling of face and throat  . A fast heartbeat  . A bad rash all over body  . Dizziness and weakness   Immunizations Administered    Name Date Dose VIS Date Route   Pfizer COVID-19 Vaccine 01/30/2020 12:51 PM 0.3 mL 11/08/2019 Intramuscular   Manufacturer: [place]   Lot: [id number]   [place]: [id number]

## 2020-02-03 ENCOUNTER — Other Ambulatory Visit: Payer: Self-pay | Admitting: Family Medicine

## 2020-02-04 ENCOUNTER — Telehealth: Payer: Self-pay

## 2020-02-04 ENCOUNTER — Other Ambulatory Visit: Payer: Self-pay | Admitting: Family Medicine

## 2020-02-04 ENCOUNTER — Other Ambulatory Visit: Payer: Self-pay | Admitting: *Deleted

## 2020-02-04 MED ORDER — ALLOPURINOL 300 MG PO TABS: 300.0000 mg | ORAL_TABLET | Freq: Every day | ORAL | 1 refills | Status: DC

## 2020-02-04 NOTE — Telephone Encounter (Signed)
Patient called in to see if Dr. Charlett Blake could send in a prescription for allopurinol (ZYLOPRIM) 300 MG tablet [id number]   Please send it to: [facility]  [place], [place] [address]  Phone:  [phone] Fax:  [phone]  DEA #:  --

## 2020-02-04 NOTE — Telephone Encounter (Signed)
Medication sent in. 

## 2020-02-26 ENCOUNTER — Ambulatory Visit: Payer: Medicare Other | Attending: Internal Medicine

## 2020-02-26 DIAGNOSIS — Z23 Encounter for immunization: Secondary | ICD-10-CM

## 2020-02-26 NOTE — Progress Notes (Signed)
   Covid-19 Vaccination Clinic  Name:  Daniel Reeves    MRN: [id number] DOB: February 21, 1924  02/26/2020  Daniel Reeves was observed post Covid-19 immunization for 15 minutes without incident. He was provided with Vaccine Information Sheet and instruction to access the V-Safe system.   Daniel Reeves was instructed to call 911 with any severe reactions post vaccine: [name] Difficulty breathing  . Swelling of face and throat  . A fast heartbeat  . A bad rash all over body  . Dizziness and weakness   Immunizations Administered    Name Date Dose VIS Date Route   Pfizer COVID-19 Vaccine 02/26/2020  2:32 PM 0.3 mL 11/08/2019 Intramuscular   Manufacturer: [place]   Lot: [id number]   [place]: [id number]

## 2020-02-28 ENCOUNTER — Emergency Department (HOSPITAL_BASED_OUTPATIENT_CLINIC_OR_DEPARTMENT_OTHER)
Admission: EM | Admit: 2020-02-28 | Discharge: 2020-02-28 | Disposition: A | Discharge status: Home / Self Care | Payer: Medicare Other | Attending: Emergency Medicine | Admitting: Emergency Medicine

## 2020-02-28 ENCOUNTER — Other Ambulatory Visit: Payer: Self-pay

## 2020-02-28 ENCOUNTER — Emergency Department (HOSPITAL_BASED_OUTPATIENT_CLINIC_OR_DEPARTMENT_OTHER): Payer: Medicare Other

## 2020-02-28 ENCOUNTER — Encounter (HOSPITAL_BASED_OUTPATIENT_CLINIC_OR_DEPARTMENT_OTHER): Payer: Self-pay | Admitting: *Deleted

## 2020-02-28 DIAGNOSIS — I5022 Chronic systolic (congestive) heart failure: Secondary | ICD-10-CM | POA: Diagnosis not present

## 2020-02-28 DIAGNOSIS — M1990 Unspecified osteoarthritis, unspecified site: Secondary | ICD-10-CM

## 2020-02-28 DIAGNOSIS — I110 Hypertensive heart disease with heart failure: Secondary | ICD-10-CM | POA: Diagnosis not present

## 2020-02-28 DIAGNOSIS — M1612 Unilateral primary osteoarthritis, left hip: Secondary | ICD-10-CM | POA: Diagnosis not present

## 2020-02-28 DIAGNOSIS — Z7982 Long term (current) use of aspirin: Secondary | ICD-10-CM | POA: Diagnosis not present

## 2020-02-28 DIAGNOSIS — M160 Bilateral primary osteoarthritis of hip: Secondary | ICD-10-CM | POA: Diagnosis not present

## 2020-02-28 DIAGNOSIS — I2510 Atherosclerotic heart disease of native coronary artery without angina pectoris: Secondary | ICD-10-CM | POA: Diagnosis not present

## 2020-02-28 DIAGNOSIS — Z79899 Other long term (current) drug therapy: Secondary | ICD-10-CM | POA: Insufficient documentation

## 2020-02-28 DIAGNOSIS — M545 Low back pain: Secondary | ICD-10-CM | POA: Diagnosis not present

## 2020-02-28 DIAGNOSIS — Z951 Presence of aortocoronary bypass graft: Secondary | ICD-10-CM | POA: Diagnosis not present

## 2020-02-28 DIAGNOSIS — M25552 Pain in left hip: Secondary | ICD-10-CM

## 2020-02-28 DIAGNOSIS — Z955 Presence of coronary angioplasty implant and graft: Secondary | ICD-10-CM | POA: Diagnosis not present

## 2020-02-28 DIAGNOSIS — Z87891 Personal history of nicotine dependence: Secondary | ICD-10-CM | POA: Insufficient documentation

## 2020-02-28 DIAGNOSIS — M1611 Unilateral primary osteoarthritis, right hip: Secondary | ICD-10-CM | POA: Diagnosis not present

## 2020-02-28 LAB — URINALYSIS, ROUTINE W REFLEX MICROSCOPIC
Bilirubin Urine: NEGATIVE
Glucose, UA: NEGATIVE mg/dL
Hgb urine dipstick: NEGATIVE
Ketones, ur: NEGATIVE mg/dL
Leukocytes,Ua: NEGATIVE
Nitrite: NEGATIVE
Protein, ur: NEGATIVE mg/dL
Specific Gravity, Urine: 1.025 (ref 1.005–1.030)
pH: 5.5 (ref 5.0–8.0)

## 2020-02-28 NOTE — Discharge Instructions (Addendum)
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 You also have scoliosis noted to your lower spine area however this appears unchanged from a previous xray.   Continue taking Tylenol as needed for pain. Follow up with your PCP regarding your ED visit today.   Return to the ED for any worsening symptoms including worsening pain, limping, weakness/numbness/tingling in your legs, fevers > 100.4 or chills, or redness to your hip joint.

## 2020-02-28 NOTE — ED Triage Notes (Signed)
Left hip pain. He is ambulatory.

## 2020-02-28 NOTE — ED Provider Notes (Signed)
[facility] EMERGENCY DEPARTMENT Provider Note   CSN: [id number] Arrival date & time: 02/28/20  1245     History Chief Complaint  Patient presents with  . Hip Pain    Daniel Reeves is a 86 y.o. male with PMHx GERD, CAD, CHF, who presents to the ED today complaining of gradual onset, constant, achy, left hip/left lower back pain x 1 day. Pt denies any injury to his hip. Reports the pain is exacerbated with movement however has still been able to ambulate without difficulty. Has been taking Tylenol with mild relief. Pt reports a couple of weeks ago he had pain to his right hip and it has felt like it has slowly radiated and moved over to the left side. He no longer has pain on the right side. Denies fevers, chills, urinary symptoms, abdominal pain, nausea, vomiting, or any other associated symptoms.   The history is provided by the patient and medical records.       Past Medical History:  Diagnosis Date  . Abdominal aortic aneurysm ([place])    a. [place] (1/14):  3.3 x 3.4 cm => f/u 11/2013  . Amebic dysentery   . AMEBIC DYSENTERY 11/19/2007   Qualifier: History of  By: Lenna Gilford MD, Deborra Medina   . Anemia 03/07/2017  . ANXIETY 11/19/2007   Qualifier: Diagnosis of  By: Lenna Gilford MD, Deborra Medina   . Arthritis 03/07/2017  . Atherosclerosis of coronary artery bypass graft with unstable angina pectoris ([place]) 04/19/2013  . BACK PAIN, LUMBAR 11/16/2007   Qualifier: Diagnosis of  By: Julien Girt CMA, Leigh    . Benign prostatic hypertrophy   . BENIGN PROSTATIC HYPERTROPHY, HX OF 11/16/2007   Qualifier: Diagnosis of  By: Julien Girt CMA, Leigh    . BRBPR (bright red blood per rectum) 11/01/2016  . CAD (coronary artery disease)    a. s/p CABG in 1979 and 1993;  b. LHC (5/14):  LM, LAD, CFX and RCA occluded; L-LAD ok, dLAD occluded after insertion of LIMA, S-OM occluded, S-PDA/AM 80-90 => PCI with Promus DES; EF 25%  . Cardiomyopathy, ischemic 06/05/2013  . Cerumen impaction    Bilateral  . Chicken pox as a  child  . Chronic systolic CHF (congestive heart failure) ([place])   . COLONIC POLYPS 07/01/2008   Qualifier: Diagnosis of  By: Lenna Gilford MD, Deborra Medina   . Degenerative joint disease   . DEGENERATIVE JOINT DISEASE 11/16/2007   Qualifier: Diagnosis of  By: Julien Girt CMA, Marliss Czar    . Diverticulosis of colon   . DIVERTICULOSIS OF COLON 07/01/2008   Qualifier: Diagnosis of  By: Lenna Gilford MD, Deborra Medina   . Double vision 03/07/2017  . Essential hypertension 11/16/2007   Qualifier: Diagnosis of  By: Julien Girt CMA, Marliss Czar    . Fingernail abnormalities 03/07/2017  . FLANK PAIN, RIGHT 02/03/2010   Qualifier: History of  By: Lenna Gilford MD, Deborra Medina   . GERD (gastroesophageal reflux disease)   . GOUT 11/16/2007   Qualifier: Diagnosis of  By: Julien Girt CMA, Marliss Czar    . Hearing loss 11/24/2014  . Heart murmur   . History of shingles 10/27/2017  . Hypercholesterolemia   . HYPERCHOLESTEROLEMIA 11/16/2007   Qualifier: Diagnosis of  By: Julien Girt CMA, Marliss Czar    . Ischemic cardiomyopathy    a. echo (09/05/13): EF 35%, diffuse HK worsened distal septal, mid/distal inferior and apical region, grade 1 diastolic dysfunction, mild LAE.    [name] Kidney stone 08/17/2011  . Loss of hearing   . Lumbar back  pain   . Measles as a child  . Medicare annual wellness visit, subsequent 11/24/2014   Sees Dr Delman Cheadle for dermatology Sees Dr Roni Bread of Urology Sees Dr Stanford Breed of cardiology Sees Dr Virginia Rochester of Opthamology No further colonoscopies warranted       . Mumps as a child  . Nephrolithiasis   . Pain in joint, lower leg 07/29/2014  . PERIPHERAL VASCULAR DISEASE 11/16/2007   Qualifier: Diagnosis of  By: Julien Girt CMA, Marliss Czar    . Peripheral vascular disease ([place])   . Rectal bleeding 03/07/2017  . Shingles 07/29/2014  . Sun-damaged skin 05/31/2014    Patient Active Problem List   Diagnosis Date Noted  . Grief reaction 12/24/2019  . Chest pain 11/30/2019  . Finger laceration 04/28/2019  . Leg swelling 03/09/2019  . Hyperglycemia 01/22/2019  . Otitis externa 01/22/2019    . Edema 12/17/2018  . Right hip pain 12/17/2018  . Scrotal bleeding 03/01/2018  . Foot pain, right 03/01/2018  . Hemorrhoid 10/29/2017  . History of shingles 10/27/2017  . Anemia 03/07/2017  . Double vision 03/07/2017  . Arthritis 03/07/2017  . Medicare annual wellness visit, subsequent 11/24/2014  . Hearing loss 11/24/2014  . Pain in joint, lower leg 07/29/2014  . Sun-damaged skin 05/31/2014  . Chicken pox   . Measles   . Mumps   . Cardiomyopathy, ischemic 06/05/2013  . Atherosclerosis of coronary artery bypass graft with unstable angina pectoris ([place]) 04/19/2013  . Kidney stone 08/17/2011  . CAD (coronary artery disease)   . CERUMEN IMPACTION, BILATERAL 02/03/2010  . FLANK PAIN, RIGHT 02/03/2010  . Abdominal aortic aneurysm ([place]) 11/04/2009  . COLONIC POLYPS 07/01/2008  . Anxiety state 11/19/2007  . HYPERCHOLESTEROLEMIA 11/16/2007  . Gout 11/16/2007  . Essential hypertension 11/16/2007  . PERIPHERAL VASCULAR DISEASE 11/16/2007  . GERD 11/16/2007  . Osteoarthritis 11/16/2007  . BACK PAIN, LUMBAR 11/16/2007  . BPH (benign prostatic hyperplasia) 11/16/2007    Past Surgical History:  Procedure Laterality Date  . CORONARY ANGIOPLASTY WITH STENT PLACEMENT  04/18/2013   RCA       . CORONARY ARTERY BYPASS GRAFT  1979   x4 SVG-DIAG-LAD, SVG-OM-PDA  . CORONARY ARTERY BYPASS GRAFT  1993   Redo x5 by Dr Harlow Asa; [facility], SVG-OM, SVG-AM-PL  . Decompressive laminectomy  01/2006   L2 - scarum by Dr. Shellia Carwin  . HEMORRHOID SURGERY     fissure with hemorrhoid corrected at age 55  . INGUINAL HERNIA REPAIR  1994   Right by Dr Harlow Asa  . INGUINAL HERNIA REPAIR  1996   Left by Dr. Harlow Asa  . LEFT HEART CATHETERIZATION WITH CORONARY ANGIOGRAM N/A 09/23/2013   Procedure: LEFT HEART CATHETERIZATION WITH CORONARY ANGIOGRAM;  Surgeon: Blane Ohara, MD;  Location: [facility] CATH LAB;  Service: Cardiovascular;  Laterality: N/A;  . lens implants     for vision correction  . PERCUTANEOUS  CORONARY STENT INTERVENTION (PCI-S) N/A 04/18/2013   Procedure: PERCUTANEOUS CORONARY STENT INTERVENTION (PCI-S);  Surgeon: Sherren Mocha, MD;  Location: [facility] CATH LAB;  Service: Cardiovascular;  Laterality: N/A;  . TONSILLECTOMY         Family History  Problem Relation Age of Onset  . Parkinsonism Brother   . Diabetes Maternal Grandmother   . Depression Daughter   . Other Son        4 stents  . Heart disease Son   . Diabetes Son        type 2  . Colon cancer Neg Hx   .  Esophageal cancer Neg Hx   . Rectal cancer Neg Hx   . Stomach cancer Neg Hx     Social History   Tobacco Use  . Smoking status: Former Smoker    Quit date: 11/28/1944    Years since quitting: 75.3  . Smokeless tobacco: Never Used  Substance Use Topics  . Alcohol use: Yes    Alcohol/week: 2.0 standard drinks    Types: 2 Standard drinks or equivalent per week    Comment: daily rum  or wine  . Drug use: No    Home Medications Prior to Admission medications   Medication Sig Start Date End Date Taking? Authorizing Provider  allopurinol (ZYLOPRIM) 300 MG tablet Take 1 tablet (300 mg total) by mouth daily. 02/04/20   [name], MD  AMBULATORY NON FORMULARY MEDICATION Place 30 g rectally 2 (two) times daily. Medication Name: Nitroglycerin 0.125% gel. Apply pea size amount to the rectum two times a day for 6-8 weeks Avoid exercise within 30 minutes of application. 01/10/20   Cirigliano, Vito V, DO  aspirin EC 81 MG tablet Take 81 mg by mouth every morning.     [provider]  B Complex-C (B-COMPLEX WITH VITAMIN C) tablet Take 1 tablet by mouth daily.  09/16/19   Shelda Pal, DO  Cholecalciferol (VITAMIN D-3 PO) Take 5,000 Units by mouth daily with breakfast.     [provider]  famotidine (PEPCID) 20 MG tablet Take 1 tablet (20 mg total) by mouth 2 (two) times daily. 10/31/19   [name], MD  finasteride (PROSCAR) 5 MG tablet Takes every third day    [provider]    folic acid (FOLVITE) A999333 MCG tablet Take 400 mcg by mouth 2 (two) times daily.     [provider]  isosorbide mononitrate (IMDUR) 30 MG 24 hr tablet Take 3 tablets by mouth once daily 02/04/20   [name], MD  losartan (COZAAR) 50 MG tablet TAKE 1 TABLET BY MOUTH ONCE DAILY .  MUST  KEEP  APPOINTMENT  FOR  REFILLS. 08/28/19   Lelon Perla, MD  metoprolol succinate (TOPROL-XL) 25 MG 24 hr tablet Take 1/2 (one-half) tablet by mouth once daily 12/17/19   Lelon Perla, MD  Misc Natural Products (OSTEO BI-FLEX ADV JOINT SHIELD) TABS Take 1 tablet by mouth 2 (two) times daily.     [provider]  nitroGLYCERIN (NITROSTAT) 0.4 MG SL tablet Place 1 tablet (0.4 mg total) under the tongue every 5 (five) minutes as needed. For chest pain. Patient not taking: Reported on 01/10/2020 11/27/18   Lelon Perla, MD  simvastatin (ZOCOR) 40 MG tablet TAKE 1 TABLET BY MOUTH ONCE DAILY IN THE EVENING 10/07/19   [name], MD    Allergies    Lisinopril, Methocarbamol, Other, Pregabalin, and Ramipril  Review of Systems   Review of Systems  Constitutional: Negative for chills and fever.  Musculoskeletal: Positive for arthralgias.  All other systems reviewed and are negative.   Physical Exam Updated Vital Signs BP (!) 160/77 (BP Location: Left Arm)   Pulse 66   Temp 97.6 F (36.4 C) (Oral)   Resp 16   Ht 6' (1.829 m)   Wt 74.4 kg   SpO2 100%   BMI 22.24 kg/m   Physical Exam Vitals and nursing note reviewed.  Constitutional:      Appearance: He is not ill-appearing or diaphoretic.  HENT:     Head: Normocephalic and atraumatic.  Eyes:     Conjunctiva/sclera: Conjunctivae normal.  Cardiovascular:     Rate and Rhythm: Normal rate and regular rhythm.     Pulses: Normal pulses.  Pulmonary:     Effort: Pulmonary effort is normal.     Breath sounds: Normal breath sounds. No wheezing, rhonchi or rales.  Abdominal:     Palpations: Abdomen is soft.      Tenderness: There is no abdominal tenderness. There is left CVA tenderness. There is no right CVA tenderness, guarding or rebound.  Musculoskeletal:     Cervical back: Neck supple.     Comments: No C, T, or L midline spinal tenderness. No obvious tenderness to paralumbar spinal musculature or posterior aspect of hip. No pain illicited with log roll of left leg or flexion of the hip itself. No limp appreciated with ambulation. 2+ DP pulse.   Skin:    General: Skin is warm and dry.  Neurological:     Mental Status: He is alert.     ED Results / Procedures / Treatments   Labs (all labs ordered are listed, but only abnormal results are displayed) Labs Reviewed  URINALYSIS, ROUTINE W REFLEX MICROSCOPIC    EKG None  Radiology DG Lumbar Spine Complete  Result Date: 02/28/2020 CLINICAL DATA:  Back pain.  Left hip pain. EXAM: LUMBAR SPINE - COMPLETE 4+ VIEW COMPARISON:  MRI lumbar spine 06/10/2017. FINDINGS: Prominent lumbar spine scoliosis concave left. Lower lumbar laminectomy defects. No acute bony abnormality identified. No evidence of fracture. No interim change from prior. Aortoiliac atherosclerotic vascular disease. Prior median sternotomy. IMPRESSION: Prominent lumbar spine scoliosis concave left. Lower lumbar laminectomy defects. No acute bony abnormality. Stable exam from prior exam. Electronically Signed   By: Marcello Moores  Register   On: 02/28/2020 14:40   DG Hip Unilat With Pelvis 2-3 Views Left  Result Date: 02/28/2020 CLINICAL DATA:  Low back and left hip pain. EXAM: DG HIP (WITH OR WITHOUT PELVIS) 2-3V LEFT COMPARISON:  Pelvis and right hip x-rays dated December 11, 2018. FINDINGS: Unchanged mild bilateral hip osteoarthritis with joint space narrowing, marginal osteophytes, and subchondral sclerosis/cyst formation. No acute fracture or dislocation. Severe lower lumbar spondylosis again noted. IMPRESSION: 1. Unchanged mild bilateral hip osteoarthritis. Electronically Signed   By: Titus Dubin M.D.   On: 02/28/2020 14:40    Procedures Procedures (including critical care time)  Medications Ordered in ED Medications - No data to display  ED Course  I have reviewed the triage vital signs and the nursing notes.  Pertinent labs & imaging results that were available during my care of the patient were reviewed by me and considered in my medical decision making (see chart for details).    MDM Rules/Calculators/A&P                      86 year old male who presents to the ED today complaining of atraumatic left hip/left lower back pain x 1 day.  On arrival to the ED patient is afebrile, nontachycardic and nontachypneic.  Personally visualized patient ambulating from waiting room without difficulty and no limp appreciated.  Patient has been taking Tylenol as needed for pain.  He is quite mobile for 89.  Goal to discern bony tenderness.  Patient does react to some left CVA palpation. Will obtain U/A and if no acute findings will proceed with xrays.   UA without signs of infection.  Pelvis and L-spine x-rays ordered.   X-rays show bilateral hip arthritis as well as lumbar  scoliosis unchanged from previous exam.  Advised patient to continue taking Tylenol as needed for pain and to follow-up with his PCP for further evaluation.  He does report he has gotten cortisone injections into his joints in the past.  He may benefit from this and can discuss this with PCP.  Return precautions discussed with patient including worsening pain, limping, weakness, numbness, tingling in leg, fevers, chills, redness to joints.  Patient is in agreement with plan and stable for discharge home.   This note was prepared using Dragon voice recognition software and may include unintentional dictation errors due to the inherent limitations of voice recognition software.  Final Clinical Impression(s) / ED Diagnoses Final diagnoses:  Left hip pain  Arthritis    Rx / DC Orders ED Discharge Orders    None        Discharge Instructions     Your hip xray showed some arthritic changes in both of your hips. You also have scoliosis noted to your lower spine area however this appears unchanged from a previous xray.   Continue taking Tylenol as needed for pain. Follow up with your PCP regarding your ED visit today.   Return to the ED for any worsening symptoms including worsening pain, limping, weakness/numbness/tingling in your legs, fevers > 100.4 or chills, or redness to your hip joint.        Eustaquio Maize, PA-C 02/28/20 [place], [place], DO 02/28/20 1519

## 2020-03-02 DIAGNOSIS — M545 Low back pain: Secondary | ICD-10-CM | POA: Diagnosis not present

## 2020-03-02 DIAGNOSIS — M9904 Segmental and somatic dysfunction of sacral region: Secondary | ICD-10-CM | POA: Diagnosis not present

## 2020-03-02 DIAGNOSIS — M9903 Segmental and somatic dysfunction of lumbar region: Secondary | ICD-10-CM | POA: Diagnosis not present

## 2020-03-04 ENCOUNTER — Other Ambulatory Visit: Payer: Self-pay | Admitting: Cardiology

## 2020-03-04 DIAGNOSIS — M9904 Segmental and somatic dysfunction of sacral region: Secondary | ICD-10-CM | POA: Diagnosis not present

## 2020-03-04 DIAGNOSIS — M545 Low back pain: Secondary | ICD-10-CM | POA: Diagnosis not present

## 2020-03-04 DIAGNOSIS — M9903 Segmental and somatic dysfunction of lumbar region: Secondary | ICD-10-CM | POA: Diagnosis not present

## 2020-03-05 DIAGNOSIS — M9903 Segmental and somatic dysfunction of lumbar region: Secondary | ICD-10-CM | POA: Diagnosis not present

## 2020-03-05 DIAGNOSIS — M545 Low back pain: Secondary | ICD-10-CM | POA: Diagnosis not present

## 2020-03-05 DIAGNOSIS — M9904 Segmental and somatic dysfunction of sacral region: Secondary | ICD-10-CM | POA: Diagnosis not present

## 2020-03-09 ENCOUNTER — Other Ambulatory Visit: Payer: Self-pay

## 2020-03-09 ENCOUNTER — Telehealth: Payer: Self-pay | Admitting: Family Medicine

## 2020-03-09 NOTE — Telephone Encounter (Addendum)
Caller : Stedman Tori  Call Back # [phone]    Concern : Pain    Patient states he was out doing yard work....  Per patient  he is experiencing pain in his hip since March. Patient seen in the ED over the weekend for pain. Pt states that he had second vaccine shot on March 31. Patient states that he taking Ibuprofen per his chiropractors request. Per patient the  chiropractor change location of from hip to back.   Doctors findings :  Patient has arthritis both hips /scoliosis   Patient is requesting to see Dr Charlett Blake asap

## 2020-03-10 DIAGNOSIS — M545 Low back pain: Secondary | ICD-10-CM | POA: Diagnosis not present

## 2020-03-10 DIAGNOSIS — M9904 Segmental and somatic dysfunction of sacral region: Secondary | ICD-10-CM | POA: Diagnosis not present

## 2020-03-10 DIAGNOSIS — M9903 Segmental and somatic dysfunction of lumbar region: Secondary | ICD-10-CM | POA: Diagnosis not present

## 2020-03-10 NOTE — Telephone Encounter (Signed)
Appointment made for 4/15

## 2020-03-12 ENCOUNTER — Other Ambulatory Visit: Payer: Self-pay

## 2020-03-12 ENCOUNTER — Ambulatory Visit (INDEPENDENT_AMBULATORY_CARE_PROVIDER_SITE_OTHER): Payer: Medicare Other | Admitting: Family Medicine

## 2020-03-12 VITALS — BP 130/56 | HR 57 | Temp 98.0°F | Resp 12 | Ht 72.0 in | Wt 160.4 lb

## 2020-03-12 DIAGNOSIS — M545 Low back pain, unspecified: Secondary | ICD-10-CM

## 2020-03-12 DIAGNOSIS — N179 Acute kidney failure, unspecified: Secondary | ICD-10-CM

## 2020-03-12 DIAGNOSIS — E7800 Pure hypercholesterolemia, unspecified: Secondary | ICD-10-CM | POA: Diagnosis not present

## 2020-03-12 DIAGNOSIS — I10 Essential (primary) hypertension: Secondary | ICD-10-CM | POA: Diagnosis not present

## 2020-03-12 DIAGNOSIS — H9192 Unspecified hearing loss, left ear: Secondary | ICD-10-CM

## 2020-03-12 DIAGNOSIS — I255 Ischemic cardiomyopathy: Secondary | ICD-10-CM

## 2020-03-12 DIAGNOSIS — R739 Hyperglycemia, unspecified: Secondary | ICD-10-CM | POA: Diagnosis not present

## 2020-03-12 DIAGNOSIS — I208 Other forms of angina pectoris: Secondary | ICD-10-CM | POA: Diagnosis not present

## 2020-03-12 LAB — COMPREHENSIVE METABOLIC PANEL
ALT: 15 U/L (ref 0–53)
AST: 24 U/L (ref 0–37)
Albumin: 4 g/dL (ref 3.5–5.2)
Alkaline Phosphatase: 76 U/L (ref 39–117)
BUN: 37 mg/dL — ABNORMAL HIGH (ref 6–23)
CO2: 22 mEq/L (ref 19–32)
Calcium: 9.2 mg/dL (ref 8.4–10.5)
Chloride: 107 mEq/L (ref 96–112)
Creatinine, Ser: 1.62 mg/dL — ABNORMAL HIGH (ref 0.40–1.50)
GFR: 39.88 mL/min — ABNORMAL LOW (ref >60.00)
Glucose, Bld: 106 mg/dL — ABNORMAL HIGH (ref 70–99)
Potassium: 4.4 mEq/L (ref 3.5–5.1)
Sodium: 137 mEq/L (ref 135–145)
Total Bilirubin: 0.8 mg/dL (ref 0.2–1.2)
Total Protein: 6.2 g/dL (ref 6.0–8.3)

## 2020-03-12 LAB — CBC
HCT: 37.3 % — ABNORMAL LOW (ref 39.0–52.0)
Hemoglobin: 12.7 g/dL — ABNORMAL LOW (ref 13.0–17.0)
MCHC: 34.1 g/dL (ref 30.0–36.0)
MCV: 98.8 fl (ref 78.0–100.0)
Platelets: 247 10*3/uL (ref 150.0–400.0)
RBC: 3.78 Mil/uL — ABNORMAL LOW (ref 4.22–5.81)
RDW: 13.7 % (ref 11.5–15.5)
WBC: 9.3 10*3/uL (ref 4.0–10.5)

## 2020-03-12 LAB — LIPID PANEL
Cholesterol: 82 mg/dL (ref 0–200)
HDL: 30.5 mg/dL — ABNORMAL LOW (ref >39.00)
LDL Cholesterol: 33 mg/dL (ref 0–99)
NonHDL: 51
Total CHOL/HDL Ratio: 3
Triglycerides: 90 mg/dL (ref 0.0–149.0)
VLDL: 18 mg/dL (ref 0.0–40.0)

## 2020-03-12 LAB — TSH: TSH: 2.21 u[IU]/mL (ref 0.35–4.50)

## 2020-03-12 LAB — HEMOGLOBIN A1C: Hgb A1c MFr Bld: 5.7 % (ref 4.6–6.5)

## 2020-03-12 NOTE — Patient Instructions (Addendum)
[employer] for hearing aides. Do not need to be a member to make an appointment for a hearing evaluation. Can call for an appointment.    Tylenol/Acetaminophen ES 500 mg three x daily and Ibuprofen/Advil/Motrin 200 mg three x daily Hypertension, Adult High blood pressure (hypertension) is when the force of blood pumping through the arteries is too strong. The arteries are the blood vessels that carry blood from the heart throughout the body. Hypertension forces the heart to work harder to pump blood and may cause arteries to become narrow or stiff. Untreated or uncontrolled hypertension can cause a heart attack, heart failure, a stroke, kidney disease, and other problems. A blood pressure reading consists of a higher number over a lower number. Ideally, your blood pressure should be below 120/80. The first ("top") number is called the systolic pressure. It is a measure of the pressure in your arteries as your heart beats. The second ("bottom") number is called the diastolic pressure. It is a measure of the pressure in your arteries as the heart relaxes. What are the causes? The exact cause of this condition is not known. There are some conditions that result in or are related to high blood pressure. What increases the risk? Some risk factors for high blood pressure are under your control. The following factors may make you more likely to develop this condition:  Smoking.  Having type 2 diabetes mellitus, high cholesterol, or both.  Not getting enough exercise or physical activity.  Being overweight.  Having too much fat, sugar, calories, or salt (sodium) in your diet.  Drinking too much alcohol. Some risk factors for high blood pressure may be difficult or impossible to change. Some of these factors include:  Having chronic kidney disease.  Having a family history of high blood pressure.  Age. Risk increases with age.  Race. You may be at higher risk if you are African American.  Gender.  Men are at higher risk than women before age 21. After age 78, women are at higher risk than men.  Having obstructive sleep apnea.  Stress. What are the signs or symptoms? High blood pressure may not cause symptoms. Very high blood pressure (hypertensive crisis) may cause:  Headache.  Anxiety.  Shortness of breath.  Nosebleed.  Nausea and vomiting.  Vision changes.  Severe chest pain.  Seizures. How is this diagnosed? This condition is diagnosed by measuring your blood pressure while you are seated, with your arm resting on a flat surface, your legs uncrossed, and your feet flat on the floor. The cuff of the blood pressure monitor will be placed directly against the skin of your upper arm at the level of your heart. It should be measured at least twice using the same arm. Certain conditions can cause a difference in blood pressure between your right and left arms. Certain factors can cause blood pressure readings to be lower or higher than normal for a short period of time:  When your blood pressure is higher when you are in a health care provider's office than when you are at home, this is called white coat hypertension. Most people with this condition do not need medicines.  When your blood pressure is higher at home than when you are in a health care provider's office, this is called masked hypertension. Most people with this condition may need medicines to control blood pressure. If you have a high blood pressure reading during one visit or you have normal blood pressure with other risk factors, you  may be asked to:  Return on a different day to have your blood pressure checked again.  Monitor your blood pressure at home for 1 week or longer. If you are diagnosed with hypertension, you may have other blood or imaging tests to help your health care provider understand your overall risk for other conditions. How is this treated? This condition is treated by making healthy  lifestyle changes, such as eating healthy foods, exercising more, and reducing your alcohol intake. Your health care provider may prescribe medicine if lifestyle changes are not enough to get your blood pressure under control, and if:  Your systolic blood pressure is above 130.  Your diastolic blood pressure is above 80. Your personal target blood pressure may vary depending on your medical conditions, your age, and other factors. Follow these instructions at home: Eating and drinking   Eat a diet that is high in fiber and potassium, and low in sodium, added sugar, and fat. An example eating plan is called the DASH (Dietary Approaches to Stop Hypertension) diet. To eat this way: ? Eat plenty of fresh fruits and vegetables. Try to fill one half of your plate at each meal with fruits and vegetables. ? Eat whole grains, such as whole-wheat pasta, brown rice, or whole-grain bread. Fill about one fourth of your plate with whole grains. ? Eat or drink low-fat dairy products, such as skim milk or low-fat yogurt. ? Avoid fatty cuts of meat, processed or cured meats, and poultry with skin. Fill about one fourth of your plate with lean proteins, such as fish, chicken without skin, beans, eggs, or tofu. ? Avoid pre-made and processed foods. These tend to be higher in sodium, added sugar, and fat.  Reduce your daily sodium intake. Most people with hypertension should eat less than 1,500 mg of sodium a day.  Do not drink alcohol if: ? Your health care provider tells you not to drink. ? You are pregnant, may be pregnant, or are planning to become pregnant.  If you drink alcohol: ? Limit how much you use to:  0-1 drink a day for women.  0-2 drinks a day for men. ? Be aware of how much alcohol is in your drink. In the [place] one drink equals one 12 oz bottle of beer (355 mL), one 5 oz glass of wine (148 mL), or one 1 oz glass of hard liquor (44 mL). Lifestyle   Work with your health care provider to  maintain a healthy body weight or to lose weight. Ask what an ideal weight is for you.  Get at least 30 minutes of exercise most days of the week. Activities may include walking, swimming, or biking.  Include exercise to strengthen your muscles (resistance exercise), such as Pilates or lifting weights, as part of your weekly exercise routine. Try to do these types of exercises for 30 minutes at least 3 days a week.  Do not use any products that contain nicotine or tobacco, such as cigarettes, e-cigarettes, and chewing tobacco. If you need help quitting, ask your health care provider.  Monitor your blood pressure at home as told by your health care provider.  Keep all follow-up visits as told by your health care provider. This is important. Medicines  Take over-the-counter and prescription medicines only as told by your health care provider. Follow directions carefully. Blood pressure medicines must be taken as prescribed.  Do not skip doses of blood pressure medicine. Doing this puts you at risk for problems and can  make the medicine less effective.  Ask your health care provider about side effects or reactions to medicines that you should watch for. Contact a health care provider if you:  Think you are having a reaction to a medicine you are taking.  Have headaches that keep coming back (recurring).  Feel dizzy.  Have swelling in your ankles.  Have trouble with your vision. Get help right away if you:  Develop a severe headache or confusion.  Have unusual weakness or numbness.  Feel faint.  Have severe pain in your chest or abdomen.  Vomit repeatedly.  Have trouble breathing. Summary  Hypertension is when the force of blood pumping through your arteries is too strong. If this condition is not controlled, it may put you at risk for serious complications.  Your personal target blood pressure may vary depending on your medical conditions, your age, and other factors. For  most people, a normal blood pressure is less than 120/80.  Hypertension is treated with lifestyle changes, medicines, or a combination of both. Lifestyle changes include losing weight, eating a healthy, low-sodium diet, exercising more, and limiting alcohol. This information is not intended to replace advice given to you by your health care provider. Make sure you discuss any questions you have with your health care provider. Document Revised: 07/25/2018 Document Reviewed: 07/25/2018 Elsevier Patient Education  2020 [employer].

## 2020-03-12 NOTE — Assessment & Plan Note (Signed)
hgba1c acceptable, minimize simple carbs. Increase exercise as tolerated.  

## 2020-03-12 NOTE — Assessment & Plan Note (Addendum)
Both ears has a crackling sound frequently. Tried Fluticasone to help but it was not helpful. Referred to [facility] for evaluation and possible hearing aides.

## 2020-03-12 NOTE — Assessment & Plan Note (Signed)
Arthritis in both legs, hips. Scoliosis

## 2020-03-12 NOTE — Assessment & Plan Note (Signed)
Well controlled, no changes to meds. Encouraged heart healthy diet such as the DASH diet and exercise as tolerated.  °

## 2020-03-12 NOTE — Progress Notes (Signed)
3

## 2020-03-14 DIAGNOSIS — N179 Acute kidney failure, unspecified: Secondary | ICD-10-CM | POA: Insufficient documentation

## 2020-03-14 DIAGNOSIS — N189 Chronic kidney disease, unspecified: Secondary | ICD-10-CM | POA: Insufficient documentation

## 2020-03-14 NOTE — Assessment & Plan Note (Signed)
From NSAID use. Encouraged to increase water intake and only use Acetaminophen for pain relief

## 2020-03-14 NOTE — Assessment & Plan Note (Addendum)
With hip pain L>R. He was using his ride on lawn mower when it got stuck in the mud and he tried to wedge it out with a 2 by 4 and he has been suffering with this pain since then. He gets some relief from lidocaine patches but it is temporary. He is ready for a referral to orthopaedics for further treatment. He has been taking NSAIDs but after her renal panel showed some worsening in function he is encouraged to only use Tylenol til seen by orthopaedics.

## 2020-03-14 NOTE — Assessment & Plan Note (Signed)
Encouraged heart healthy diet, increase exercise, avoid trans fats, consider a krill oil cap daily 

## 2020-03-14 NOTE — Progress Notes (Signed)
Patient ID: Daniel Reeves, male   DOB: 05/20/1924, 86 y.o.   MRN: [id number]   Subjective:    Patient ID: Daniel Reeves, male    DOB: 11-12-1924, 86 y.o.   MRN: [id number]  Chief Complaint  Patient presents with  . Hip Pain    left side    HPI Patient is in today for evaluation of back and hip pain. hip pain L>R. He was using his ride on lawn mower when it got stuck in the mud and he tried to wedge it out with a 2 by 4 and he has been suffering with this pain since then. He gets some relief from lidocaine patches but it is temporary. He has also been using OTC NSAIDS for pain relief. Denies CP/palp/SOB/HA/congestion/fevers/GI or GU c/o. Taking meds as prescribed. He is frustrated with the limitations the pain is causing him. .  Past Medical History:  Diagnosis Date  . Abdominal aortic aneurysm ([place])    a. [place] (1/14):  3.3 x 3.4 cm => f/u 11/2013  . Amebic dysentery   . AMEBIC DYSENTERY 11/19/2007   Qualifier: History of  By: Lenna Gilford MD, Deborra Medina   . Anemia 03/07/2017  . ANXIETY 11/19/2007   Qualifier: Diagnosis of  By: Lenna Gilford MD, Deborra Medina   . Arthritis 03/07/2017  . 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 BACK PAIN, LUMBAR 11/16/2007   Qualifier: Diagnosis of  By: Julien Girt CMA, Leigh    . Benign prostatic hypertrophy   . BENIGN PROSTATIC HYPERTROPHY, HX OF 11/16/2007   Qualifier: Diagnosis of  By: Julien Girt CMA, Leigh    . BRBPR (bright red blood per rectum) 11/01/2016  . CAD (coronary artery disease)    a. s/p CABG in 1979 and 1993;  b. LHC (5/14):  LM, LAD, CFX and RCA occluded; L-LAD ok, dLAD occluded after insertion of LIMA, S-OM occluded, S-PDA/AM 80-90 => PCI with Promus DES; EF 25%  . Cardiomyopathy, ischemic 06/05/2013  . Cerumen impaction    Bilateral  . Chicken pox as a child  . Chronic systolic CHF (congestive heart failure) ([place])   . COLONIC POLYPS 07/01/2008   Qualifier: Diagnosis of  By: Lenna Gilford MD, Deborra Medina   .  Degenerative joint disease   . DEGENERATIVE JOINT DISEASE 11/16/2007   Qualifier: Diagnosis of  By: Julien Girt CMA, Marliss Czar    . Diverticulosis of colon   . DIVERTICULOSIS OF COLON 07/01/2008   Qualifier: Diagnosis of  By: Lenna Gilford MD, Deborra Medina   . Double vision 03/07/2017  . Essential hypertension 11/16/2007   Qualifier: Diagnosis of  By: Julien Girt CMA, Marliss Czar    . Fingernail abnormalities 03/07/2017  . FLANK PAIN, RIGHT 02/03/2010   Qualifier: History of  By: Lenna Gilford MD, Deborra Medina   . GERD (gastroesophageal reflux disease)   . GOUT 11/16/2007   Qualifier: Diagnosis of  By: Julien Girt CMA, Marliss Czar    . Hearing loss 11/24/2014  . Heart murmur   . History of shingles 10/27/2017  . Hypercholesterolemia   . HYPERCHOLESTEROLEMIA 11/16/2007   Qualifier: Diagnosis of  By: Julien Girt CMA, Marliss Czar    . Ischemic cardiomyopathy    a. echo (09/05/13): EF 35%, diffuse HK worsened distal septal, mid/distal inferior and apical region, grade 1 diastolic dysfunction, mild LAE.    [name] Kidney stone 08/17/2011  . Loss of hearing   . Lumbar back pain   . Measles as a child  . Medicare annual wellness visit, subsequent 11/24/2014  Sees Dr Delman Cheadle for dermatology Sees Dr Roni Bread of Urology Sees Dr Stanford Breed of cardiology Sees Dr Virginia Rochester of Opthamology No further colonoscopies warranted       . Mumps as a child  . Nephrolithiasis   . Pain in joint, lower leg 07/29/2014  . PERIPHERAL VASCULAR DISEASE 11/16/2007   Qualifier: Diagnosis of  By: Julien Girt CMA, Marliss Czar    . Peripheral vascular disease ([place])   . Rectal bleeding 03/07/2017  . Shingles 07/29/2014  . Sun-damaged skin 05/31/2014    Past Surgical History:  Procedure Laterality Date  . CORONARY ANGIOPLASTY WITH STENT PLACEMENT  04/18/2013   RCA       . CORONARY ARTERY BYPASS GRAFT  1979   x4 SVG-DIAG-LAD, SVG-OM-PDA  . CORONARY ARTERY BYPASS GRAFT  1993   Redo x5 by Dr Harlow Asa; [facility], SVG-OM, SVG-AM-PL  . Decompressive laminectomy  01/2006   L2 - scarum by Dr. Shellia Carwin  . HEMORRHOID  SURGERY     fissure with hemorrhoid corrected at age 24  . INGUINAL HERNIA REPAIR  1994   Right by Dr Harlow Asa  . INGUINAL HERNIA REPAIR  1996   Left by Dr. Harlow Asa  . LEFT HEART CATHETERIZATION WITH CORONARY ANGIOGRAM N/A 09/23/2013   Procedure: LEFT HEART CATHETERIZATION WITH CORONARY ANGIOGRAM;  Surgeon: Blane Ohara, MD;  Location: [facility] CATH LAB;  Service: Cardiovascular;  Laterality: N/A;  . lens implants     for vision correction  . PERCUTANEOUS CORONARY STENT INTERVENTION (PCI-S) N/A 04/18/2013   Procedure: PERCUTANEOUS CORONARY STENT INTERVENTION (PCI-S);  Surgeon: Sherren Mocha, MD;  Location[facility] C[facility] LAB;  Service: Cardiovascular;  Laterality: N/A;  . TONSILLECTOMY      Family History  Problem Relation Age of Onset  . Parkinsonism Brother   . Diabetes Maternal Grandmother   . Depression Daughter   . Other Son        4 stents  . Heart disease Son   . Diabetes Son        type 2  . Colon cancer Neg Hx   . Esophageal cancer Neg Hx   . Rectal cancer Neg Hx   . Stomach cancer Neg Hx     Social History   Socioeconomic History  . Marital status: Widowed    Spouse name: [name] x 72 years  . Number of children: 7  . Years of education: Not on file  . Highest education level: Not on file  Occupational History  . 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 Smoking status: Former Smoker    Quit date: 11/28/1944    Years since quitting: 75.3  . Smokeless tobacco: Never Used  Substance and Sexual Activity  . Alcohol use: Yes    Alcohol/week: 2.0 standard drinks    Types: 2 Standard drinks or equivalent per week    Comment: daily rum  or wine  . Drug use: No  . Sexual activity: Not Currently    Comment: lives with wife, no dietary restrictions.   Other Topics Concern  . Not on file  Social History Narrative   Married   7 children   Social Determinants of Health   Financial Resource Strain:   . Difficulty of Paying Living Expenses:    Food Insecurity:   . Worried About Charity fundraiser in the Last Year:   . Arboriculturist in the Last Year:   Transportation Needs:   . Film/video editor (Medical):   [name]  Lack of Transportation (Non-Medical):   Physical Activity:   . Days of Exercise per Week:   . Minutes of Exercise per Session:   Stress:   . Feeling of Stress :   Social Connections:   . Frequency of Communication with Friends and Family:   . Frequency of Social Gatherings with Friends and Family:   . Attends Religious Services:   . Active Member of Clubs or Organizations:   . Attends Archivist Meetings:   [name] Marital Status:   Intimate Partner Violence:   . Fear of Current or Ex-Partner:   . Emotionally Abused:   [name] Physically Abused:   . Sexually Abused:     Outpatient Medications Prior to Visit  Medication Sig Dispense Refill  . allopurinol (ZYLOPRIM) 300 MG tablet Take 1 tablet (300 mg total) by mouth daily. 90 tablet 1  . AMBULATORY NON FORMULARY MEDICATION Place 30 g rectally 2 (two) times daily. Medication Name: Nitroglycerin 0.125% gel. Apply pea size amount to the rectum two times a day for 6-8 weeks Avoid exercise within 30 minutes of application. 30 g 1  . aspirin EC 81 MG tablet Take 81 mg by mouth every morning.     . B Complex-C (B-COMPLEX WITH VITAMIN C) tablet Take 1 tablet by mouth daily.     . Cholecalciferol (VITAMIN D-3 PO) Take 5,000 Units by mouth daily with breakfast.     . famotidine (PEPCID) 20 MG tablet Take 1 tablet (20 mg total) by mouth 2 (two) times daily. 90 tablet 3  . finasteride (PROSCAR) 5 MG tablet Takes every third day    . folic acid (FOLVITE) A999333 MCG tablet Take 400 mcg by mouth 2 (two) times daily.     . isosorbide mononitrate (IMDUR) 30 MG 24 hr tablet Take 3 tablets by mouth once daily 270 tablet 0  . losartan (COZAAR) 50 MG tablet Take 1 tablet (50 mg total) by mouth daily. 90 tablet 2  . metoprolol succinate (TOPROL-XL) 25 MG 24 hr tablet Take 1/2  (one-half) tablet by mouth once daily 45 tablet 0  . Misc Natural Products (OSTEO BI-FLEX ADV JOINT SHIELD) TABS Take 1 tablet by mouth 2 (two) times daily.     . nitroGLYCERIN (NITROSTAT) 0.4 MG SL tablet Place 1 tablet (0.4 mg total) under the tongue every 5 (five) minutes as needed. For chest pain. 25 tablet 6  . simvastatin (ZOCOR) 40 MG tablet TAKE 1 TABLET BY MOUTH ONCE DAILY IN THE EVENING 90 tablet 1   No facility-administered medications prior to visit.    Allergies  Allergen Reactions  . Lisinopril     REACTION: dizziness  . Methocarbamol     REACTION: pt states "dizzy"  . Other   . Pregabalin     REACTION: pt states "dizzy"  . Ramipril     REACTION: hives and dizziness    Review of Systems  Constitutional: Negative for fever and malaise/fatigue.  HENT: Negative for congestion.   Eyes: Negative for blurred vision.  Respiratory: Negative for shortness of breath.   Cardiovascular: Negative for chest pain, palpitations and leg swelling.  Gastrointestinal: Negative for abdominal pain, blood in stool and nausea.  Genitourinary: Negative for dysuria and frequency.  Musculoskeletal: Positive for back pain and joint pain. Negative for falls.  Skin: Negative for rash.  Neurological: Negative for dizziness, loss of consciousness and headaches.  Endo/Heme/Allergies: Negative for environmental allergies.  Psychiatric/Behavioral: Negative for depression. The patient is nervous/anxious.  Objective:    Physical Exam Vitals and nursing note reviewed.  Constitutional:      General: He is not in acute distress.    Appearance: He is well-developed.  HENT:     Head: Normocephalic and atraumatic.     Nose: Nose normal.  Eyes:     General:        Right eye: No discharge.        Left eye: No discharge.  Cardiovascular:     Rate and Rhythm: Normal rate and regular rhythm.     Heart sounds: No murmur.  Pulmonary:     Effort: Pulmonary effort is normal.     Breath  sounds: Normal breath sounds.  Abdominal:     General: Bowel sounds are normal.     Palpations: Abdomen is soft.     Tenderness: There is no abdominal tenderness.  Musculoskeletal:     Cervical back: Normal range of motion and neck supple.  Skin:    General: Skin is warm and dry.  Neurological:     Mental Status: He is alert and oriented to person, place, and time.     BP (!) 130/56 (BP Location: Left Arm, Cuff Size: Normal)   Pulse (!) 57   Temp 98 F (36.7 C) (Temporal)   Resp 12   Ht 6' (1.829 m)   Wt 160 lb 6.4 oz (72.8 kg)   SpO2 97%   BMI 21.75 kg/m  Wt Readings from Last 3 Encounters:  03/12/20 160 lb 6.4 oz (72.8 kg)  02/28/20 164 lb (74.4 kg)  01/10/20 163 lb 3.2 oz (74 kg)    Diabetic Foot Exam - Simple   No data filed     Lab Results  Component Value Date   WBC 9.3 03/12/2020   HGB 12.7 (L) 03/12/2020   HCT 37.3 (L) 03/12/2020   PLT 247.0 03/12/2020   GLUCOSE 106 (H) 03/12/2020   CHOL 82 03/12/2020   TRIG 90.0 03/12/2020   HDL 30.50 (L) 03/12/2020   LDLCALC 33 03/12/2020   ALT 15 03/12/2020   AST 24 03/12/2020   NA 137 03/12/2020   K 4.4 03/12/2020   CL 107 03/12/2020   CREATININE 1.62 (H) 03/12/2020   BUN 37 (H) 03/12/2020   CO2 22 03/12/2020   TSH 2.21 03/12/2020   PSA 4.29 (H) 02/08/2011   INR 1.09 02/04/2018   HGBA1C 5.7 03/12/2020    Lab Results  Component Value Date   TSH 2.21 03/12/2020   Lab Results  Component Value Date   WBC 9.3 03/12/2020   HGB 12.7 (L) 03/12/2020   HCT 37.3 (L) 03/12/2020   MCV 98.8 03/12/2020   PLT 247.0 03/12/2020   Lab Results  Component Value Date   NA 137 03/12/2020   K 4.4 03/12/2020   CO2 22 03/12/2020   GLUCOSE 106 (H) 03/12/2020   BUN 37 (H) 03/12/2020   CREATININE 1.62 (H) 03/12/2020   BILITOT 0.8 03/12/2020   ALKPHOS 76 03/12/2020   AST 24 03/12/2020   ALT 15 03/12/2020   PROT 6.2 03/12/2020   ALBUMIN 4.0 03/12/2020   CALCIUM 9.2 03/12/2020   ANIONGAP 7 11/30/2019   GFR 39.88 (L)  03/12/2020   Lab Results  Component Value Date   CHOL 82 03/12/2020   Lab Results  Component Value Date   HDL 30.50 (L) 03/12/2020   Lab Results  Component Value Date   LDLCALC 33 03/12/2020   Lab Results  Component Value Date  TRIG 90.0 03/12/2020   Lab Results  Component Value Date   CHOLHDL 3 03/12/2020   Lab Results  Component Value Date   HGBA1C 5.7 03/12/2020       Assessment & Plan:   Problem List Items Addressed This Visit    HYPERCHOLESTEROLEMIA    Encouraged heart healthy diet, increase exercise, avoid trans fats, consider a krill oil cap daily      Relevant Orders   Lipid panel (Completed)   Essential hypertension    Well controlled, no changes to meds. Encouraged heart healthy diet such as the DASH diet and exercise as tolerated.       Relevant Orders   CBC (Completed)   Comprehensive metabolic panel (Completed)   TSH (Completed)   BACK PAIN, LUMBAR - Primary    With hip pain L>R. He was using his ride on lawn mower when it got stuck in the mud and he tried to wedge it out with a 2 by 4 and he has been suffering with this pain since then. He gets some relief from lidocaine patches but it is temporary. He is ready for a referral to orthopaedics for further treatment. He has been taking NSAIDs but after her renal panel showed some worsening in function he is encouraged to only use Tylenol til seen by orthopaedics.       Relevant Orders   Ambulatory referral to Orthopedic Surgery   Hearing loss    Both ears has a crackling sound frequently. Tried Fluticasone to help but it was not helpful. Referred to [facility] for evaluation and possible hearing aides.      Hyperglycemia    hgba1c acceptable, minimize simple carbs. Increase exercise as tolerated.       Relevant Orders   Hemoglobin A1c (Completed)   Exertional angina (HCC)   AKI (acute kidney injury) ([place])    From NSAID use. Encouraged to increase water intake and only use Acetaminophen for pain  relief         I am having Maudie Flakes. Deguia "Dick" maintain his folic acid, Osteo Bi-Flex Adv Joint Shield, Cholecalciferol (VITAMIN D-3 PO), aspirin EC, finasteride, nitroGLYCERIN, B-complex with vitamin C, simvastatin, famotidine, metoprolol succinate, AMBULATORY NON FORMULARY MEDICATION, allopurinol, isosorbide mononitrate, and losartan.  No orders of the defined types were placed in this encounter.    Penni Homans, MD

## 2020-03-16 ENCOUNTER — Ambulatory Visit (INDEPENDENT_AMBULATORY_CARE_PROVIDER_SITE_OTHER): Payer: Medicare Other | Admitting: Physician Assistant

## 2020-03-16 ENCOUNTER — Other Ambulatory Visit: Payer: Self-pay

## 2020-03-16 ENCOUNTER — Telehealth: Payer: Self-pay | Admitting: *Deleted

## 2020-03-16 ENCOUNTER — Encounter: Payer: Self-pay | Admitting: Physician Assistant

## 2020-03-16 ENCOUNTER — Other Ambulatory Visit: Payer: Self-pay | Admitting: *Deleted

## 2020-03-16 DIAGNOSIS — R799 Abnormal finding of blood chemistry, unspecified: Secondary | ICD-10-CM

## 2020-03-16 DIAGNOSIS — R7989 Other specified abnormal findings of blood chemistry: Secondary | ICD-10-CM

## 2020-03-16 DIAGNOSIS — I255 Ischemic cardiomyopathy: Secondary | ICD-10-CM | POA: Diagnosis not present

## 2020-03-16 DIAGNOSIS — N5082 Scrotal pain: Secondary | ICD-10-CM

## 2020-03-16 DIAGNOSIS — M5440 Lumbago with sciatica, unspecified side: Secondary | ICD-10-CM | POA: Diagnosis not present

## 2020-03-16 DIAGNOSIS — D649 Anemia, unspecified: Secondary | ICD-10-CM

## 2020-03-16 DIAGNOSIS — N289 Disorder of kidney and ureter, unspecified: Secondary | ICD-10-CM

## 2020-03-16 MED ORDER — TRAMADOL HCL 50 MG PO TABS: 50.0000 mg | ORAL_TABLET | Freq: Every day | ORAL | 0 refills | Status: DC

## 2020-03-16 MED ORDER — METHYLPREDNISOLONE 2 MG PO TABS: ORAL_TABLET | ORAL | 0 refills | Status: DC

## 2020-03-16 NOTE — Telephone Encounter (Signed)
The kidney issue should not cause pain in his scrotum. If he continues to see blood in his stool especially if it is multiple times a day and he has increasing pain and lightheadedness he should go to the ER. If those things do not occur can repeat labs later in the week and can add a urinalysis and culture for renal insufficiency and scrotal pain

## 2020-03-16 NOTE — Progress Notes (Addendum)
Office Visit Note   Patient: Daniel Reeves           Date of Birth: [birth year]           MRN: [id number] Visit Date: 03/16/2020              Requested by: [name], MD [address] [place],  [place] [address] PCP: [name], MD   Assessment & Plan: Visit Diagnoses:  1. Low back pain with sciatica, sciatica laterality unspecified, unspecified back pain laterality, unspecified chronicity     Plan: We will send him to formal physical therapy for his back for range of motion, strengthening, home exercise program and modalities.  Placed him on a Medrol dose pack.  Also sent in tramadol to be used mostly at night.  He is to take no NSAIDs while on the Medrol Dosepak.  His daughter who is present throughout examination today and understands the plan.  Questions encouraged and answered  Follow-Up Instructions: No follow-ups on file.   Orders:  No orders of the defined types were placed in this encounter.  Meds ordered this encounter  Medications  . methylPREDNISolone (MEDROL) 2 MG tablet    Sig: Take as directed    Dispense:  21 tablet    Refill:  0  . traMADol (ULTRAM) 50 MG tablet    Sig: Take 1 tablet (50 mg total) by mouth at bedtime.    Dispense:  15 tablet    Refill:  0      Procedures: No procedures performed   Clinical Data: No additional findings.   Subjective: Chief Complaint  Patient presents with  . Lower Back - Pain    HPI [id number] comes in today for low back pain.  We are seeing for the first time.  He reports on March 29 he got his lawnmower stuck and tried to move it using some two by fours.  He has seen a chiropractor with no real relief.  Does have a history of back surgery more than 10 years ago.  States has had on and off back pain but nothing chiropractor picking take care of.  He has been taking Tylenol for the back pain.  Having no numbness or tingling down both legs.  Does have some pain occasionally down the left  leg lateral thigh.  Denies any bowel bladder dysfunction.  No waking pain. Prior radiographs dated 02/28/2020 are reviewed of the lumbar spine and showed no acute fractures.  Scoliosis with concavity to the left.  Prior laminectomies L2-S1.  No spondylolisthesis.  Review of Systems See HPI otherwise negative  Objective: Vital Signs: There were no vitals taken for this visit.  Physical Exam Constitutional:      Appearance: He is normal weight. He is not ill-appearing or diaphoretic.  Cardiovascular:     Pulses: Normal pulses.  Pulmonary:     Effort: Pulmonary effort is normal.  Neurological:     Mental Status: He is alert and oriented to person, place, and time.  Psychiatric:        Mood and Affect: Mood normal.     Ortho Exam Lower extremities he has 5 out of 5 strength throughout the lower extremities against resistance except for the right great toe extension against resistance 4-5 strength.  Negative straight leg raise bilaterally.  Tight hamstrings bilaterally.  He lacks the last 6 inches and be I will touch his toes.  Normal extension lumbar spine.  Slightly unsteady  with flexion-extension lumbar spine.  Deep tendon reflexes are 2+ at knees and ankles and equal and symmetric.  Sensation grossly intact bilateral feet to light touch throughout.  Specialty Comments:  No specialty comments available.  Imaging: No results found.   PMFS History: Patient Active Problem List   Diagnosis Date Noted  . AKI (acute kidney injury) ([place]) 03/14/2020  . Exertional angina ([place]) 03/12/2020  . Grief reaction 12/24/2019  . Chest pain 11/30/2019  . Finger laceration 04/28/2019  . Leg swelling 03/09/2019  . Hyperglycemia 01/22/2019  . Otitis externa 01/22/2019  . Edema 12/17/2018  . Right hip pain 12/17/2018  . Scrotal bleeding 03/01/2018  . Foot pain, right 03/01/2018  . Hemorrhoid 10/29/2017  . History of shingles 10/27/2017  . Anemia 03/07/2017  . Double vision 03/07/2017  .  Arthritis 03/07/2017  . Medicare annual wellness visit, subsequent 11/24/2014  . Hearing loss 11/24/2014  . Pain in joint, lower leg 07/29/2014  . Sun-damaged skin 05/31/2014  . Chicken pox   . Measles   . Mumps   . Cardiomyopathy, ischemic 06/05/2013  . 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 Kidney stone 08/17/2011  . CAD (coronary artery disease)   . CERUMEN IMPACTION, BILATERAL 02/03/2010  . FLANK PAIN, RIGHT 02/03/2010  . Abdominal aortic aneurysm ([place]) 11/04/2009  . COLONIC POLYPS 07/01/2008  . Anxiety state 11/19/2007  . HYPERCHOLESTEROLEMIA 11/16/2007  . Gout 11/16/2007  . Essential hypertension 11/16/2007  . PERIPHERAL VASCULAR DISEASE 11/16/2007  . GERD 11/16/2007  . Osteoarthritis 11/16/2007  . BACK PAIN, LUMBAR 11/16/2007  . BPH (benign prostatic hyperplasia) 11/16/2007   Past Medical History:  Diagnosis Date  . Abdominal aortic aneurysm ([place])    a. [place] (1/14):  3.3 x 3.4 cm => f/u 11/2013  . Amebic dysentery   . AMEBIC DYSENTERY 11/19/2007   Qualifier: History of  By: Lenna Gilford MD, Deborra Medina   . Anemia 03/07/2017  . ANXIETY 11/19/2007   Qualifier: Diagnosis of  By: Lenna Gilford MD, Deborra Medina   . Arthritis 03/07/2017  . 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 BACK PAIN, LUMBAR 11/16/2007   Qualifier: Diagnosis of  By: Julien Girt CMA, Leigh    . Benign prostatic hypertrophy   . BENIGN PROSTATIC HYPERTROPHY, HX OF 11/16/2007   Qualifier: Diagnosis of  By: Julien Girt CMA, Leigh    . BRBPR (bright red blood per rectum) 11/01/2016  . CAD (coronary artery disease)    a. s/p CABG in 1979 and 1993;  b. LHC (5/14):  LM, LAD, CFX and RCA occluded; L-LAD ok, dLAD occluded after insertion of LIMA, S-OM occluded, S-PDA/AM 80-90 => PCI with Promus DES; EF 25%  . Cardiomyopathy, ischemic 06/05/2013  . Cerumen impaction    Bilateral  . Chicken pox as a child  . Chronic systolic CHF  (congestive heart failure) ([place])   . COLONIC POLYPS 07/01/2008   Qualifier: Diagnosis of  By: Lenna Gilford MD, Deborra Medina   . Degenerative joint disease   . DEGENERATIVE JOINT DISEASE 11/16/2007   Qualifier: Diagnosis of  By: Julien Girt CMA, Marliss Czar    . Diverticulosis of colon   . DIVERTICULOSIS OF COLON 07/01/2008   Qualifier: Diagnosis of  By: Lenna Gilford MD, Deborra Medina   . Double vision 03/07/2017  . Essential hypertension 11/16/2007   Qualifier: Diagnosis of  By: Julien Girt CMA, Marliss Czar    . Fingernail abnormalities 03/07/2017  . FLANK PAIN, RIGHT 02/03/2010   Qualifier:  History of  By: Lenna Gilford MD, Deborra Medina   . GERD (gastroesophageal reflux disease)   . GOUT 11/16/2007   Qualifier: Diagnosis of  By: Julien Girt CMA, Marliss Czar    . Hearing loss 11/24/2014  . Heart murmur   . History of shingles 10/27/2017  . Hypercholesterolemia   . HYPERCHOLESTEROLEMIA 11/16/2007   Qualifier: Diagnosis of  By: Julien Girt CMA, Marliss Czar    . Ischemic cardiomyopathy    a. echo (09/05/13): EF 35%, diffuse HK worsened distal septal, mid/distal inferior and apical region, grade 1 diastolic dysfunction, mild LAE.    [name] Kidney stone 08/17/2011  . Loss of hearing   . Lumbar back pain   . Measles as a child  . Medicare annual wellness visit, subsequent 11/24/2014   Sees Dr Delman Cheadle for dermatology Sees Dr Roni Bread of Urology Sees Dr Stanford Breed of cardiology Sees Dr Virginia Rochester of Opthamology No further colonoscopies warranted       . Mumps as a child  . Nephrolithiasis   . Pain in joint, lower leg 07/29/2014  . PERIPHERAL VASCULAR DISEASE 11/16/2007   Qualifier: Diagnosis of  By: Julien Girt CMA, Marliss Czar    . Peripheral vascular disease ([place])   . Rectal bleeding 03/07/2017  . Shingles 07/29/2014  . Sun-damaged skin 05/31/2014    Family History  Problem Relation Age of Onset  . Parkinsonism Brother   . Diabetes Maternal Grandmother   . Depression Daughter   . Other Son        4 stents  . Heart disease Son   . Diabetes Son        type 2  . Colon cancer Neg Hx   . Esophageal  cancer Neg Hx   . Rectal cancer Neg Hx   . Stomach cancer Neg Hx     Past Surgical History:  Procedure Laterality Date  . CORONARY ANGIOPLASTY WITH STENT PLACEMENT  04/18/2013   RCA       . CORONARY ARTERY BYPASS GRAFT  1979   x4 SVG-DIAG-LAD, SVG-OM-PDA  . CORONARY ARTERY BYPASS GRAFT  1993   Redo x5 by Dr Harlow Asa; [facility], SVG-OM, SVG-AM-PL  . Decompressive laminectomy  01/2006   L2 - scarum by Dr. Shellia Carwin  . HEMORRHOID SURGERY     fissure with hemorrhoid corrected at age 55  . INGUINAL HERNIA REPAIR  1994   Right by Dr Harlow Asa  . INGUINAL HERNIA REPAIR  1996   Left by Dr. Harlow Asa  . LEFT HEART CATHETERIZATION WITH CORONARY ANGIOGRAM N/A 09/23/2013   Procedure: LEFT HEART CATHETERIZATION WITH CORONARY ANGIOGRAM;  Surgeon: Blane Ohara, MD;  Location: [facility] CATH LAB;  Service: Cardiovascular;  Laterality: N/A;  . lens implants     for vision correction  . PERCUTANEOUS CORONARY STENT INTERVENTION (PCI-S) N/A 04/18/2013   Procedure: PERCUTANEOUS CORONARY STENT INTERVENTION (PCI-S);  Surgeon: Sherren Mocha, MD;  Location[facility] CATH LAB;  Service: Cardiovascular;  Laterality: N/A;  . TONSILLECTOMY     Social History   Occupational History  . 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 Smoking status: Former Smoker    Quit date: 11/28/1944    Years since quitting: 75.3  . Smokeless tobacco: Never Used  Substance and Sexual Activity  . Alcohol use: Yes    Alcohol/week: 2.0 standard drinks    Types: 2 Standard drinks or equivalent per week    Comment: daily rum  or wine  . Drug use: No  . Sexual activity: Not Currently  Comment: lives with wife, no dietary restrictions.

## 2020-03-16 NOTE — Telephone Encounter (Signed)
-----   Message from [name], MD sent at 03/12/2020  8:09 PM EDT ----- Notify renal functions are up some. He should stop the Ibuprofen/Advil and only take the Tylenol/Acetamnophen ES 500 mg tabs. 2 tabs twice a day instead. Hydrate well and we will need to repeat CMP next week. Also mild anemia noted. Mild but new. Have him complete an Ifob and repeat a cbc with iron studies next week as well. All else looks good. Watch all bowel movements and report of any black and sticky or bloody stool.

## 2020-03-16 NOTE — Telephone Encounter (Signed)
Gave patient results.  He stated that he left a message or spoke with Dr. Lorin Mercy office on 4/18 stating that he had a large stool which caused some bleeding.  Stopped nitroglycerin and took 2 ibuprofen.  He is willing to check for the blood in his stool.  Also with all the pain he is encountering, he has pain in both testicles.  Could the kidney issue reflect the pain there?  Also would you like to add urine?  He will be in Friday for blood work.

## 2020-03-16 NOTE — Telephone Encounter (Signed)
Spoke with patient and advised of note below.

## 2020-03-16 NOTE — Telephone Encounter (Signed)
Caller : Orby Hogle Call Back # [phone]  Subject : Admission   Pt states he would like to know if it best from him to the ED for anemia and black stool. Patient is looking pain relief.   Please Advise

## 2020-03-17 ENCOUNTER — Telehealth: Payer: Self-pay | Admitting: Physician Assistant

## 2020-03-17 ENCOUNTER — Other Ambulatory Visit: Payer: Self-pay | Admitting: Radiology

## 2020-03-17 DIAGNOSIS — M5440 Lumbago with sciatica, unspecified side: Secondary | ICD-10-CM

## 2020-03-17 NOTE — Telephone Encounter (Signed)
Patient called.   His MEDROL was not available for pick up when he went to pick up his other prescription.   He said his pharmacy provided an alternative for it and he wanted to run it by [facility].   Call back: [phone]

## 2020-03-17 NOTE — Telephone Encounter (Signed)
Advised patient medication will be filled at pharmacy today.

## 2020-03-17 NOTE — Telephone Encounter (Signed)
Patient called.

## 2020-03-20 ENCOUNTER — Other Ambulatory Visit: Payer: Self-pay

## 2020-03-20 ENCOUNTER — Telehealth: Payer: Self-pay | Admitting: Family Medicine

## 2020-03-20 ENCOUNTER — Other Ambulatory Visit (INDEPENDENT_AMBULATORY_CARE_PROVIDER_SITE_OTHER): Payer: Medicare Other

## 2020-03-20 ENCOUNTER — Other Ambulatory Visit: Payer: Medicare Other

## 2020-03-20 ENCOUNTER — Ambulatory Visit (INDEPENDENT_AMBULATORY_CARE_PROVIDER_SITE_OTHER): Payer: Medicare Other | Admitting: Medical

## 2020-03-20 VITALS — BP 139/68 | HR 81 | Resp 18 | Ht 72.0 in | Wt 156.8 lb

## 2020-03-20 DIAGNOSIS — R7989 Other specified abnormal findings of blood chemistry: Secondary | ICD-10-CM

## 2020-03-20 DIAGNOSIS — N289 Disorder of kidney and ureter, unspecified: Secondary | ICD-10-CM

## 2020-03-20 DIAGNOSIS — N50812 Left testicular pain: Secondary | ICD-10-CM | POA: Diagnosis not present

## 2020-03-20 DIAGNOSIS — N5082 Scrotal pain: Secondary | ICD-10-CM

## 2020-03-20 DIAGNOSIS — D649 Anemia, unspecified: Secondary | ICD-10-CM | POA: Diagnosis not present

## 2020-03-20 DIAGNOSIS — R350 Frequency of micturition: Secondary | ICD-10-CM

## 2020-03-20 DIAGNOSIS — I255 Ischemic cardiomyopathy: Secondary | ICD-10-CM | POA: Diagnosis not present

## 2020-03-20 DIAGNOSIS — R799 Abnormal finding of blood chemistry, unspecified: Secondary | ICD-10-CM | POA: Diagnosis not present

## 2020-03-20 LAB — POC URINALSYSI DIPSTICK (AUTOMATED)
Bilirubin, UA: NEGATIVE
Blood, UA: NEGATIVE
Glucose, UA: NEGATIVE
Ketones, UA: NEGATIVE
Leukocytes, UA: NEGATIVE
Nitrite, UA: NEGATIVE
Protein, UA: NEGATIVE
Spec Grav, UA: >=1.03 — AB (ref 1.010–1.025)
Urobilinogen, UA: 0.2 E.U./dL
pH, UA: 5 (ref 5.0–8.0)

## 2020-03-20 LAB — CBC WITH DIFFERENTIAL/PLATELET
Basophils Absolute: 0 10*3/uL (ref 0.0–0.1)
Basophils Relative: 0.1 % (ref 0.0–3.0)
Eosinophils Absolute: 0 10*3/uL (ref 0.0–0.7)
Eosinophils Relative: 0 % (ref 0.0–5.0)
HCT: 39.2 % (ref 39.0–52.0)
Hemoglobin: 13.3 g/dL (ref 13.0–17.0)
Lymphocytes Relative: 5.1 % — ABNORMAL LOW (ref 12.0–46.0)
Lymphs Abs: 0.9 10*3/uL (ref 0.7–4.0)
MCHC: 34 g/dL (ref 30.0–36.0)
MCV: 99.3 fl (ref 78.0–100.0)
Monocytes Absolute: 0.8 10*3/uL (ref 0.1–1.0)
Monocytes Relative: 4.5 % (ref 3.0–12.0)
Neutro Abs: 15.9 10*3/uL — ABNORMAL HIGH (ref 1.4–7.7)
Neutrophils Relative %: 90.3 % — ABNORMAL HIGH (ref 43.0–77.0)
Platelets: 258 10*3/uL (ref 150.0–400.0)
RBC: 3.95 Mil/uL — ABNORMAL LOW (ref 4.22–5.81)
RDW: 14.3 % (ref 11.5–15.5)
WBC: 17.7 10*3/uL — ABNORMAL HIGH (ref 4.0–10.5)

## 2020-03-20 LAB — COMPREHENSIVE METABOLIC PANEL
ALT: 21 U/L (ref 0–53)
AST: 27 U/L (ref 0–37)
Albumin: 4 g/dL (ref 3.5–5.2)
Alkaline Phosphatase: 84 U/L (ref 39–117)
BUN: 47 mg/dL — ABNORMAL HIGH (ref 6–23)
CO2: 24 mEq/L (ref 19–32)
Calcium: 9.6 mg/dL (ref 8.4–10.5)
Chloride: 105 mEq/L (ref 96–112)
Creatinine, Ser: 1.22 mg/dL (ref 0.40–1.50)
GFR: 55.32 mL/min — ABNORMAL LOW (ref >60.00)
Glucose, Bld: 107 mg/dL — ABNORMAL HIGH (ref 70–99)
Potassium: 4.4 mEq/L (ref 3.5–5.1)
Sodium: 137 mEq/L (ref 135–145)
Total Bilirubin: 0.8 mg/dL (ref 0.2–1.2)
Total Protein: 6.3 g/dL (ref 6.0–8.3)

## 2020-03-20 LAB — PSA: PSA: 18.32 ng/mL — ABNORMAL HIGH (ref 0.10–4.00)

## 2020-03-20 LAB — IBC + FERRITIN
Ferritin: 183.5 ng/mL (ref 22.0–322.0)
Iron: 100 ug/dL (ref 42–165)
Saturation Ratios: 37.4 % (ref 20.0–50.0)
Transferrin: 191 mg/dL — ABNORMAL LOW (ref 212.0–360.0)

## 2020-03-20 LAB — IRON: Iron: 100 ug/dL (ref 42–165)

## 2020-03-20 MED ORDER — SULFAMETHOXAZOLE-TRIMETHOPRIM 800-160 MG PO TABS: 1.0000 | ORAL_TABLET | Freq: Two times a day (BID) | ORAL | 0 refills | Status: DC

## 2020-03-20 NOTE — Addendum Note (Signed)
Addended by: Kelle Darting A on: 03/20/2020 10:34 AM   Modules accepted: Orders

## 2020-03-20 NOTE — Patient Instructions (Signed)
You still have lower back pain since lifting a lawnmower about 3 weeks ago.  We will have you continue Medrol dose pack, apply warm compress to left lower side of the back twice daily and try 1 tablet of tramadol prior to sleep.  Rx advisement given regarding tramadol and potential sedation.  Also make sure if not making you dizzy.  Be careful ambulating at night if he have to use the restroom.  You have some recent scrotal region pain but on exam you have left testicle region pain.  Pain more predominant in the region of epididymis.  We will treat you for epididymitis with Bactrim antibiotic and get scrotal ultrasound.  For history of anemia, will follow labs today.  Also will follow labs with Dr. Charlett Blake already placed.  Adding PSA to test for frequent urination and will follow urine culture.  Follow-up in 7 days or as needed.  You describe recent weight loss over the last month.  Please make sure you stay hydrated and will follow your metabolic panel to see if indicators of dehydration.

## 2020-03-20 NOTE — Progress Notes (Signed)
No charge. Pt seen by Mackie Pai, PA-c and charges were placed in that encounter.

## 2020-03-20 NOTE — Telephone Encounter (Signed)
Caller: Mitesh Mattern  Call Back # [phone]  Subject: Pain, Weight Loss   Complaints per patient listed below    1.Lost 7lb in the past 5weeks   2. 4 day of methylPREDNISolone (MEDROL) 2 MG tablet [id number]   issued by Dr Trevor Mace office   3. Testicle pain , reported to ED doctor  4. Lab appt 1045 , urine sample    Patient is requesting help to ease to pain.

## 2020-03-20 NOTE — Progress Notes (Signed)
Subjective:    Patient ID: Daniel Reeves, male    DOB: 1924/05/25, 86 y.o.   MRN: [id number]  HPI  Pt in with some recent low back pain and has some scrotal pain.   Faint scrotum region pain and frequent urination.  At night he notices pain more. Not bad during the day.   Pt states he is having some issues accessing my chart   For low back back pain he did get medrol for the back pain. Ice pack seems to help with pain. Pt states since march 29,2021 he got back pain after his mower got stuck in mud. He used 2 x 4 to [employer] out.   Also rx of tramadol given for night. He has not taken.    Xray done early April.  IMPRESSION: Prominent lumbar spine scoliosis concave left. Lower lumbar laminectomy defects. No acute bony abnormality. Stable exam from prior exam.  Pt saw chiropracter 10 days ago.   He also has known osteoarthriitis of both hips.  Pt states lost 9 pounds. Early April. He thinks maybe not hydrating adequatley.   Pt youngest daughter helping out.    Review of Systems  Constitutional: Negative for chills, fatigue and fever.  Respiratory: Negative for cough, chest tightness, shortness of breath and wheezing.   Cardiovascular: Negative for chest pain and palpitations.  Gastrointestinal: Negative for abdominal pain, constipation, diarrhea and nausea.  Genitourinary: Positive for frequency. Negative for dysuria, hematuria, scrotal swelling and urgency.       Faint dull scrotum pain.  Pt thinks no perineum pain.  Musculoskeletal: Positive for back pain.  Skin: Negative for rash.  Neurological: Negative for dizziness and headaches.  Hematological: Negative for adenopathy. Does not bruise/bleed easily.  Psychiatric/Behavioral: Negative for behavioral problems and confusion.    Past Medical History:  Diagnosis Date  . Abdominal aortic aneurysm ([place])    a. [place] (1/14):  3.3 x 3.4 cm => f/u 11/2013  . Amebic dysentery   . AMEBIC DYSENTERY 11/19/2007   Qualifier: History of  By: Lenna Gilford MD, Deborra Medina   . Anemia 03/07/2017  . ANXIETY 11/19/2007   Qualifier: Diagnosis of  By: Lenna Gilford MD, Deborra Medina   . Arthritis 03/07/2017  . 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 BACK PAIN, LUMBAR 11/16/2007   Qualifier: Diagnosis of  By: Julien Girt CMA, Leigh    . Benign prostatic hypertrophy   . BENIGN PROSTATIC HYPERTROPHY, HX OF 11/16/2007   Qualifier: Diagnosis of  By: Julien Girt CMA, Leigh    . BRBPR (bright red blood per rectum) 11/01/2016  . CAD (coronary artery disease)    a. s/p CABG in 1979 and 1993;  b. LHC (5/14):  LM, LAD, CFX and RCA occluded; L-LAD ok, dLAD occluded after insertion of LIMA, S-OM occluded, S-PDA/AM 80-90 => PCI with Promus DES; EF 25%  . Cardiomyopathy, ischemic 06/05/2013  . Cerumen impaction    Bilateral  . Chicken pox as a child  . Chronic systolic CHF (congestive heart failure) ([place])   . COLONIC POLYPS 07/01/2008   Qualifier: Diagnosis of  By: Lenna Gilford MD, Deborra Medina   . Degenerative joint disease   . DEGENERATIVE JOINT DISEASE 11/16/2007   Qualifier: Diagnosis of  By: Julien Girt CMA, Marliss Czar    . Diverticulosis of colon   . DIVERTICULOSIS OF COLON 07/01/2008   Qualifier: Diagnosis of  By: Lenna Gilford MD, Deborra Medina   . Double vision 03/07/2017  . Essential hypertension 11/16/2007  Qualifier: Diagnosis of  By: Julien Girt CMA, Leigh    . Fingernail abnormalities 03/07/2017  . FLANK PAIN, RIGHT 02/03/2010   Qualifier: History of  By: Lenna Gilford MD, Deborra Medina   . GERD (gastroesophageal reflux disease)   . GOUT 11/16/2007   Qualifier: Diagnosis of  By: Julien Girt CMA, Marliss Czar    . Hearing loss 11/24/2014  . Heart murmur   . History of shingles 10/27/2017  . Hypercholesterolemia   . HYPERCHOLESTEROLEMIA 11/16/2007   Qualifier: Diagnosis of  By: Julien Girt CMA, Marliss Czar    . Ischemic cardiomyopathy    a. echo (09/05/13): EF 35%, diffuse HK worsened distal septal, mid/distal inferior and apical region, grade 1 diastolic  dysfunction, mild LAE.    [name] Kidney stone 08/17/2011  . Loss of hearing   . Lumbar back pain   . Measles as a child  . Medicare annual wellness visit, subsequent 11/24/2014   Sees Dr Delman Cheadle for dermatology Sees Dr Roni Bread of Urology Sees Dr Stanford Breed of cardiology Sees Dr Virginia Rochester of Opthamology No further colonoscopies warranted       . Mumps as a child  . Nephrolithiasis   . Pain in joint, lower leg 07/29/2014  . PERIPHERAL VASCULAR DISEASE 11/16/2007   Qualifier: Diagnosis of  By: Julien Girt CMA, Marliss Czar    . Peripheral vascular disease ([place])   . Rectal bleeding 03/07/2017  . Shingles 07/29/2014  . Sun-damaged skin 05/31/2014     Social History   Socioeconomic History  . Marital status: Widowed    Spouse name: [name] x 72 years  . Number of children: 7  . Years of education: Not on file  . Highest education level: Not on file  Occupational History  . 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 Smoking status: Former Smoker    Quit date: 11/28/1944    Years since quitting: 75.3  . Smokeless tobacco: Never Used  Substance and Sexual Activity  . Alcohol use: Yes    Alcohol/week: 2.0 standard drinks    Types: 2 Standard drinks or equivalent per week    Comment: daily rum  or wine  . Drug use: No  . Sexual activity: Not Currently    Comment: lives with wife, no dietary restrictions.   Other Topics Concern  . Not on file  Social History Narrative   Married   7 children   Social Determinants of Health   Financial Resource Strain:   . Difficulty of Paying Living Expenses:   Food Insecurity:   . Worried About Charity fundraiser in the Last Year:   . Arboriculturist in the Last Year:   Transportation Needs:   . Film/video editor (Medical):   [name] Lack of Transportation (Non-Medical):   Physical Activity:   . Days of Exercise per Week:   . Minutes of Exercise per Session:   Stress:   . Feeling of Stress :   Social Connections:   . Frequency of  Communication with Friends and Family:   . Frequency of Social Gatherings with Friends and Family:   . Attends Religious Services:   . Active Member of Clubs or Organizations:   . Attends Archivist Meetings:   [name] Marital Status:   Intimate Partner Violence:   . Fear of Current or Ex-Partner:   . Emotionally Abused:   [name] Physically Abused:   . Sexually Abused:     Past Surgical History:  Procedure Laterality Date  . CORONARY  ANGIOPLASTY WITH STENT PLACEMENT  04/18/2013   RCA       . CORONARY ARTERY BYPASS GRAFT  1979   x4 SVG-DIAG-LAD, SVG-OM-PDA  . CORONARY ARTERY BYPASS GRAFT  1993   Redo x5 by Dr Harlow Asa; [facility], SVG-OM, SVG-AM-PL  . Decompressive laminectomy  01/2006   L2 - scarum by Dr. Shellia Carwin  . HEMORRHOID SURGERY     fissure with hemorrhoid corrected at age 58  . INGUINAL HERNIA REPAIR  1994   Right by Dr Harlow Asa  . INGUINAL HERNIA REPAIR  1996   Left by Dr. Harlow Asa  . LEFT HEART CATHETERIZATION WITH CORONARY ANGIOGRAM N/A 09/23/2013   Procedure: LEFT HEART CATHETERIZATION WITH CORONARY ANGIOGRAM;  Surgeon: Blane Ohara, MD;  Location: [facility] CATH LAB;  Service: Cardiovascular;  Laterality: N/A;  . lens implants     for vision correction  . PERCUTANEOUS CORONARY STENT INTERVENTION (PCI-S) N/A 04/18/2013   Procedure: PERCUTANEOUS CORONARY STENT INTERVENTION (PCI-S);  Surgeon: Sherren Mocha, MD;  Location: [facility] CATH LAB;  Service: Cardiovascular;  Laterality: N/A;  . TONSILLECTOMY      Family History  Problem Relation Age of Onset  . Parkinsonism Brother   . Diabetes Maternal Grandmother   . Depression Daughter   . Other Son        4 stents  . Heart disease Son   . Diabetes Son        type 2  . Colon cancer Neg Hx   . Esophageal cancer Neg Hx   . Rectal cancer Neg Hx   . Stomach cancer Neg Hx     Allergies  Allergen Reactions  . Lisinopril     REACTION: dizziness  . Methocarbamol     REACTION: pt states "dizzy"  . Other   . Pregabalin      REACTION: pt states "dizzy"  . Ramipril     REACTION: hives and dizziness    Current Outpatient Medications on File Prior to Visit  Medication Sig Dispense Refill  . allopurinol (ZYLOPRIM) 300 MG tablet Take 1 tablet (300 mg total) by mouth daily. 90 tablet 1  . AMBULATORY NON FORMULARY MEDICATION Place 30 g rectally 2 (two) times daily. Medication Name: Nitroglycerin 0.125% gel. Apply pea size amount to the rectum two times a day for 6-8 weeks Avoid exercise within 30 minutes of application. 30 g 1  . aspirin EC 81 MG tablet Take 81 mg by mouth every morning.     . B Complex-C (B-COMPLEX WITH VITAMIN C) tablet Take 1 tablet by mouth daily.     . Cholecalciferol (VITAMIN D-3 PO) Take 5,000 Units by mouth daily with breakfast.     . famotidine (PEPCID) 20 MG tablet Take 1 tablet (20 mg total) by mouth 2 (two) times daily. 90 tablet 3  . finasteride (PROSCAR) 5 MG tablet Takes every third day    . folic acid (FOLVITE) A999333 MCG tablet Take 400 mcg by mouth 2 (two) times daily.     . isosorbide mononitrate (IMDUR) 30 MG 24 hr tablet Take 3 tablets by mouth once daily 270 tablet 0  . losartan (COZAAR) 50 MG tablet Take 1 tablet (50 mg total) by mouth daily. 90 tablet 2  . methylPREDNISolone (MEDROL) 2 MG tablet Take as directed 21 tablet 0  . metoprolol succinate (TOPROL-XL) 25 MG 24 hr tablet Take 1/2 (one-half) tablet by mouth once daily 45 tablet 0  . Misc Natural Products (OSTEO BI-FLEX ADV JOINT SHIELD) TABS Take 1 tablet by mouth  2 (two) times daily.     . nitroGLYCERIN (NITROSTAT) 0.4 MG SL tablet Place 1 tablet (0.4 mg total) under the tongue every 5 (five) minutes as needed. For chest pain. 25 tablet 6  . simvastatin (ZOCOR) 40 MG tablet TAKE 1 TABLET BY MOUTH ONCE DAILY IN THE EVENING 90 tablet 1  . traMADol (ULTRAM) 50 MG tablet Take 1 tablet (50 mg total) by mouth at bedtime. 15 tablet 0   No current facility-administered medications on file prior to visit.    BP 139/68 (BP  Location: Left Arm, Patient Position: Sitting, Cuff Size: Large)   Pulse 81   Resp 18   Ht 6' (1.829 m)   Wt 156 lb 12.8 oz (71.1 kg)   SpO2 99%   BMI 21.27 kg/m       Objective:   Physical Exam  General- No acute distress. Pleasant patient. Neck- Full range of motion, no jvd Lungs- Clear, even and unlabored. Heart- regular rate and rhythm. Neurologic- CNII- XII grossly intact. Back- no mid lumbar. Paraspinal lumbar pain on left side on palpation.  Genital exam- no hernia palpated. Left side testicle epididymus mild tender.        Assessment & Plan:  You still have lower back pain since lifting a lawnmower about 3 weeks ago.  We will have you continue Medrol dose pack, apply warm compress to left lower side of the back twice daily and try 1 tablet of tramadol prior to sleep.  Rx advisement given regarding tramadol and potential sedation.  Also make sure if not making you dizzy.  Be careful ambulating at night if he have to use the restroom.  You have some recent scrotal region pain but on exam you have left testicle region pain.  Pain more predominant in the region of epididymis.  We will treat you for epididymitis with Bactrim antibiotic and get scrotal ultrasound.  For history of anemia, will follow labs today.  Also will follow labs with Dr. Charlett Blake already placed.  Adding PSA to test for frequent urination and will follow urine culture.  Follow-up in 7 days or as needed.  You describe recent weight loss over the last month.  Please make sure you stay hydrated and will follow your metabolic panel to see if indicators of dehydration.  Time spent with patient today was 35   minutes which consisted of chart review, discussing diagnosis, work up treatment and documentation.

## 2020-03-20 NOTE — Telephone Encounter (Signed)
Patient was seen by Percell Miller today. His scrotal pain had got worse.  All labs were put under your name. You had ordered some labs earlier for recheck and they were dropped.  Edward added on a PSA.  Just FYI

## 2020-03-20 NOTE — Addendum Note (Signed)
Addended by: Kem Boroughs D on: 03/20/2020 10:19 AM   Modules accepted: Orders

## 2020-03-20 NOTE — Addendum Note (Signed)
Addended by: Anabel Halon on: 03/20/2020 10:07 AM   Modules accepted: Orders

## 2020-03-21 ENCOUNTER — Telehealth: Payer: Self-pay | Admitting: Medical

## 2020-03-21 ENCOUNTER — Ambulatory Visit (HOSPITAL_BASED_OUTPATIENT_CLINIC_OR_DEPARTMENT_OTHER)
Admission: RE | Admit: 2020-03-21 | Discharge: 2020-03-21 | Disposition: A | Discharge status: Home / Self Care | Payer: Medicare Other | Source: Ambulatory Visit | Attending: Medical | Admitting: Medical

## 2020-03-21 DIAGNOSIS — N5082 Scrotal pain: Secondary | ICD-10-CM

## 2020-03-21 DIAGNOSIS — R972 Elevated prostate specific antigen [PSA]: Secondary | ICD-10-CM

## 2020-03-21 DIAGNOSIS — N433 Hydrocele, unspecified: Secondary | ICD-10-CM | POA: Diagnosis not present

## 2020-03-21 DIAGNOSIS — N50812 Left testicular pain: Secondary | ICD-10-CM | POA: Diagnosis not present

## 2020-03-21 LAB — URINE CULTURE
MICRO NUMBER:: 10399402
Result:: NO GROWTH
SPECIMEN QUALITY:: ADEQUATE

## 2020-03-21 MED ORDER — SULFAMETHOXAZOLE-TRIMETHOPRIM 800-160 MG PO TABS: 1.0000 | ORAL_TABLET | Freq: Two times a day (BID) | ORAL | 0 refills | Status: DC

## 2020-03-21 NOTE — Telephone Encounter (Signed)
Referral to urologist placed. 

## 2020-03-21 NOTE — Telephone Encounter (Signed)
Rx bactrim ds sent to pt pharmacy.

## 2020-03-23 ENCOUNTER — Telehealth: Payer: Self-pay | Admitting: Physician Assistant

## 2020-03-23 ENCOUNTER — Ambulatory Visit: Payer: Medicare Other | Admitting: Orthopaedic Surgery

## 2020-03-23 NOTE — Telephone Encounter (Signed)
That is fine 

## 2020-03-23 NOTE — Telephone Encounter (Signed)
LMOM for patient of the below message  

## 2020-03-23 NOTE — Telephone Encounter (Signed)
Patient called.   Wants to know if it is safe to mow his lawn. Says he has a self propelling stand behind Conservation officer, nature.   Call back: [phone]

## 2020-03-24 ENCOUNTER — Encounter: Payer: Self-pay | Admitting: *Deleted

## 2020-03-24 ENCOUNTER — Telehealth: Payer: Self-pay

## 2020-03-24 ENCOUNTER — Other Ambulatory Visit: Payer: Self-pay | Admitting: *Deleted

## 2020-03-24 DIAGNOSIS — N401 Enlarged prostate with lower urinary tract symptoms: Secondary | ICD-10-CM | POA: Diagnosis not present

## 2020-03-24 DIAGNOSIS — R972 Elevated prostate specific antigen [PSA]: Secondary | ICD-10-CM

## 2020-03-24 DIAGNOSIS — R3915 Urgency of urination: Secondary | ICD-10-CM | POA: Diagnosis not present

## 2020-03-24 DIAGNOSIS — N419 Inflammatory disease of prostate, unspecified: Secondary | ICD-10-CM

## 2020-03-24 NOTE — Telephone Encounter (Signed)
Patient called in needing Dr. Charlett Blake or the nurse  To call him to let him know if it is safe for him to take IBprophen  Or Tylenol because of his medical condition  And because of there medication he is currently taking at this time. This is urgent please follow up with the patient  as soon as possible at [phone]

## 2020-03-24 NOTE — Telephone Encounter (Signed)
Left message on machine to call back  Patient also has a call in result notes for labs

## 2020-03-25 ENCOUNTER — Telehealth: Payer: Self-pay | Admitting: Physician Assistant

## 2020-03-25 ENCOUNTER — Telehealth: Payer: Self-pay | Admitting: *Deleted

## 2020-03-25 NOTE — Telephone Encounter (Signed)
Patient aware to complete the medrol dose pak

## 2020-03-25 NOTE — Telephone Encounter (Signed)
Spoke with patient and notified him that he is not to take ibuprofen.

## 2020-03-25 NOTE — Telephone Encounter (Signed)
Patient called and was advised by urologist office to stop medrol dose pack and start ibuprofen and to ask if it was ok to do that.  I advised him that we did not write for the medrol and that his orthopedic office wrote for it.  On rx for the medrol it had Edwards name and why he called [place].  He was advised last week that he should not take ibuprofen/Advil due to renal function test being elevated.   I called and advised urology that pt is unable to take ibuprofen due renal function tests being up and that the orthopedics office wrote for his medrol dose pack.  They will get the message to Gwendolyn Fill the provider he saw at [facility].  I advised the patient that I called urology to give them the message about he could not take ibuprofen and that they may call him.  He stated that he will call Artis Delay at the orthopedics office to ask them about the medrol.  He also stated that he was unable to get the hydrocodone that was prescribed by the urologist office because they did not have it in stock and would not have it until Friday.  Advised patient that he can call the urologist office to let them know and possibly get them to send it to another pharmacy or they could write for a different dose or substitute.  He will call them as well to ask about that.

## 2020-03-25 NOTE — Telephone Encounter (Signed)
Patient called. He would like to know if he can stop with the pain patch and start taking Tylenol? His call back number is [phone]

## 2020-03-26 ENCOUNTER — Telehealth: Payer: Self-pay

## 2020-03-26 NOTE — Telephone Encounter (Signed)
Pt called in wanting to make sure he was supposed to take second round of Bactrim prescribed by Percell Miller.  I conferred with Percell Miller and Percell Miller stated yes and I relayed this information back to the patient during the telephone conversation. Pt understood to continue with second prescription of Bactrim per provider.

## 2020-03-28 ENCOUNTER — Telehealth: Payer: Self-pay | Admitting: *Deleted

## 2020-03-28 NOTE — Telephone Encounter (Signed)
Advised nurse to advise patient it was ok to do yard work.  He was started on an antibiotic on 03/21/20 and should be almost finished which should help with the WBC count.     Relationship To Patient Self Return Phone Number [phone] (Primary) Chief Complaint Lab Result Reason for Call Symptomatic / Request for [place] states he got a letter from MD: Charlett Blake and [place], 10 items out of normal range, does this preclude him from doing yard work. Did not go over letter, got this yesterday, circled tests out of range. Translation No Nurse Assessment Nurse: Cain Sieve, RN, Clarise Cruz Date/Time (Eastern Time): 03/28/2020 10:08:02 AM Confirm and document reason for call. If symptomatic, describe symptoms. ---Caller states he got a letter from MD: Charlett Blake and [place], 10 items out of normal range, does this mean he can't do yard work. Labs are transferrin, glucose, BUN, GFR, WBC (17.7), RBC, Neurophils %, Lymphocytes, Neuro abs, and PSA.

## 2020-03-30 ENCOUNTER — Other Ambulatory Visit: Payer: Self-pay | Admitting: Family Medicine

## 2020-03-31 ENCOUNTER — Telehealth: Payer: Self-pay

## 2020-04-03 ENCOUNTER — Other Ambulatory Visit: Payer: Self-pay

## 2020-04-03 ENCOUNTER — Other Ambulatory Visit (INDEPENDENT_AMBULATORY_CARE_PROVIDER_SITE_OTHER): Payer: Medicare Other

## 2020-04-03 ENCOUNTER — Encounter: Payer: Self-pay | Admitting: *Deleted

## 2020-04-03 DIAGNOSIS — R972 Elevated prostate specific antigen [PSA]: Secondary | ICD-10-CM

## 2020-04-03 DIAGNOSIS — N419 Inflammatory disease of prostate, unspecified: Secondary | ICD-10-CM

## 2020-04-03 LAB — CBC WITH DIFFERENTIAL/PLATELET
Basophils Absolute: 0.1 10*3/uL (ref 0.0–0.1)
Basophils Relative: 0.7 % (ref 0.0–3.0)
Eosinophils Absolute: 0.2 10*3/uL (ref 0.0–0.7)
Eosinophils Relative: 2.1 % (ref 0.0–5.0)
HCT: 39.2 % (ref 39.0–52.0)
Hemoglobin: 13.2 g/dL (ref 13.0–17.0)
Lymphocytes Relative: 19 % (ref 12.0–46.0)
Lymphs Abs: 1.7 10*3/uL (ref 0.7–4.0)
MCHC: 33.6 g/dL (ref 30.0–36.0)
MCV: 99.9 fl (ref 78.0–100.0)
Monocytes Absolute: 0.7 10*3/uL (ref 0.1–1.0)
Monocytes Relative: 7.5 % (ref 3.0–12.0)
Neutro Abs: 6.4 10*3/uL (ref 1.4–7.7)
Neutrophils Relative %: 70.7 % (ref 43.0–77.0)
Platelets: 240 10*3/uL (ref 150.0–400.0)
RBC: 3.92 Mil/uL — ABNORMAL LOW (ref 4.22–5.81)
RDW: 14.1 % (ref 11.5–15.5)
WBC: 9.1 10*3/uL (ref 4.0–10.5)

## 2020-04-03 LAB — PSA: PSA: 25.19 ng/mL — ABNORMAL HIGH (ref 0.10–4.00)

## 2020-04-09 ENCOUNTER — Encounter: Payer: Self-pay | Admitting: Family Medicine

## 2020-04-10 DIAGNOSIS — R351 Nocturia: Secondary | ICD-10-CM | POA: Diagnosis not present

## 2020-04-10 DIAGNOSIS — N401 Enlarged prostate with lower urinary tract symptoms: Secondary | ICD-10-CM | POA: Diagnosis not present

## 2020-04-10 DIAGNOSIS — N430 Encysted hydrocele: Secondary | ICD-10-CM | POA: Diagnosis not present

## 2020-04-13 ENCOUNTER — Ambulatory Visit: Payer: Medicare Other | Admitting: Physician Assistant

## 2020-04-14 DIAGNOSIS — K5730 Diverticulosis of large intestine without perforation or abscess without bleeding: Secondary | ICD-10-CM | POA: Diagnosis not present

## 2020-04-14 DIAGNOSIS — R351 Nocturia: Secondary | ICD-10-CM | POA: Diagnosis not present

## 2020-04-14 DIAGNOSIS — R972 Elevated prostate specific antigen [PSA]: Secondary | ICD-10-CM | POA: Diagnosis not present

## 2020-04-14 DIAGNOSIS — N401 Enlarged prostate with lower urinary tract symptoms: Secondary | ICD-10-CM | POA: Diagnosis not present

## 2020-04-14 DIAGNOSIS — R634 Abnormal weight loss: Secondary | ICD-10-CM | POA: Diagnosis not present

## 2020-04-16 ENCOUNTER — Other Ambulatory Visit: Payer: Self-pay | Admitting: Family Medicine

## 2020-04-16 DIAGNOSIS — I10 Essential (primary) hypertension: Secondary | ICD-10-CM

## 2020-04-16 DIAGNOSIS — I25700 Atherosclerosis of coronary artery bypass graft(s), unspecified, with unstable angina pectoris: Secondary | ICD-10-CM

## 2020-04-16 DIAGNOSIS — E7800 Pure hypercholesterolemia, unspecified: Secondary | ICD-10-CM

## 2020-04-20 ENCOUNTER — Other Ambulatory Visit: Payer: Self-pay

## 2020-04-20 ENCOUNTER — Ambulatory Visit (INDEPENDENT_AMBULATORY_CARE_PROVIDER_SITE_OTHER): Payer: Medicare Other | Admitting: Physician Assistant

## 2020-04-20 ENCOUNTER — Encounter: Payer: Self-pay | Admitting: Physician Assistant

## 2020-04-20 DIAGNOSIS — M7062 Trochanteric bursitis, left hip: Secondary | ICD-10-CM

## 2020-04-20 DIAGNOSIS — I255 Ischemic cardiomyopathy: Secondary | ICD-10-CM | POA: Diagnosis not present

## 2020-04-20 MED ORDER — LIDOCAINE HCL 1 % IJ SOLN
3.0000 mL | INTRAMUSCULAR | Status: AC | PRN
  Administered 2020-04-20: 3 mL

## 2020-04-20 MED ORDER — METHYLPREDNISOLONE ACETATE 40 MG/ML IJ SUSP
40.0000 mg | INTRAMUSCULAR | Status: AC | PRN
  Administered 2020-04-20: 40 mg via INTRA_ARTICULAR

## 2020-04-20 NOTE — Progress Notes (Signed)
Office Visit Note   Patient: Daniel Reeves           Date of Birth: 27-Aug-1924           MRN: [id number] Visit Date: 04/20/2020              Requested by: [name], MD [address] [place],  [place] [address] PCP: [name], MD   Assessment & Plan: Visit Diagnoses:  1. Trochanteric bursitis, left hip     Plan: He will work with physical therapy on back exercises strengthening stretching.  He will follow up with [place] as needed.  Patient did tolerate the injection well over the left trochanteric region and initially had good relief from the hip pain.  Follow-up as needed  Follow-Up Instructions: Return if symptoms worsen or fail to improve.   Orders:  Orders Placed This Encounter  Procedures  . Large Joint Inj   No orders of the defined types were placed in this encounter.     Procedures: Large Joint Inj: L greater trochanter on 04/20/2020 11:50 AM Indications: pain Details: 22 G 1.5 in needle, lateral approach  Arthrogram: No  Medications: 3 mL lidocaine 1 %; 40 mg methylPREDNISolone acetate 40 MG/ML Outcome: tolerated well, no immediate complications Procedure, treatment alternatives, risks and benefits explained, specific risks discussed. Consent was given by the patient. Immediately prior to procedure a time out was called to verify the correct patient, procedure, equipment, support staff and site/side marked as required. Patient was prepped and draped in the usual sterile fashion.       Clinical Data: No additional findings.   Subjective: Chief Complaint  Patient presents with  . Lower Back - Pain, Follow-up    HPI Daniel Reeves returns today for his low back pain.  States that most of his pain is now the lateral aspect of his hip.  Denies any numbness tingling down the leg.  He is being seen by his urologist for some lab testicular pain.  He was treated for epididymitis and bacteriuria.  He does state that the left hip pain can  awaken him at night.  Denies any numbness tingling down the leg.  He has not gone to physical therapy as of yet. Review of Systems See HPI otherwise negative or noncontributory  Objective: Vital Signs: There were no vitals taken for this visit.  Physical Exam Constitutional:      Appearance: He is not ill-appearing or diaphoretic.  Pulmonary:     Effort: Pulmonary effort is normal.  Neurological:     Mental Status: He is alert.  Psychiatric:        Mood and Affect: Mood normal.     Ortho Exam Straight leg raise is negative bilaterally.  He has good range of motion of both hips tenderness over the left trochanteric region.  No significant tenderness over the right trochanteric region. Specialty Comments:  No specialty comments available.  Imaging: No results found.   PMFS History: Patient Active Problem List   Diagnosis Date Noted  . AKI (acute kidney injury) ([place]) 03/14/2020  . Exertional angina ([place]) 03/12/2020  . Grief reaction 12/24/2019  . Chest pain 11/30/2019  . Finger laceration 04/28/2019  . Leg swelling 03/09/2019  . Hyperglycemia 01/22/2019  . Otitis externa 01/22/2019  . Edema 12/17/2018  . Right hip pain 12/17/2018  . Scrotal bleeding 03/01/2018  . Foot pain, right 03/01/2018  . Hemorrhoid 10/29/2017  . History of shingles 10/27/2017  .  Anemia 03/07/2017  . Double vision 03/07/2017  . Arthritis 03/07/2017  . Medicare annual wellness visit, subsequent 11/24/2014  . Hearing loss 11/24/2014  . Pain in joint, lower leg 07/29/2014  . Sun-damaged skin 05/31/2014  . Chicken pox   . Measles   . Mumps   . Cardiomyopathy, ischemic 06/05/2013  . 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 Kidney stone 08/17/2011  . CAD (coronary artery disease)   . CERUMEN IMPACTION, BILATERAL 02/03/2010  . FLANK PAIN, RIGHT 02/03/2010  . Abdominal aortic aneurysm ([place]) 11/04/2009  . COLONIC POLYPS 07/01/2008  .  Anxiety state 11/19/2007  . HYPERCHOLESTEROLEMIA 11/16/2007  . Gout 11/16/2007  . Essential hypertension 11/16/2007  . PERIPHERAL VASCULAR DISEASE 11/16/2007  . GERD 11/16/2007  . Osteoarthritis 11/16/2007  . BACK PAIN, LUMBAR 11/16/2007  . BPH (benign prostatic hyperplasia) 11/16/2007   Past Medical History:  Diagnosis Date  . Abdominal aortic aneurysm ([place])    a. [place] (1/14):  3.3 x 3.4 cm => f/u 11/2013  . Amebic dysentery   . AMEBIC DYSENTERY 11/19/2007   Qualifier: History of  By: Lenna Gilford MD, Deborra Medina   . Anemia 03/07/2017  . ANXIETY 11/19/2007   Qualifier: Diagnosis of  By: Lenna Gilford MD, Deborra Medina   . Arthritis 03/07/2017  . 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 BACK PAIN, LUMBAR 11/16/2007   Qualifier: Diagnosis of  By: Julien Girt CMA, Leigh    . Benign prostatic hypertrophy   . BENIGN PROSTATIC HYPERTROPHY, HX OF 11/16/2007   Qualifier: Diagnosis of  By: Julien Girt CMA, Leigh    . BRBPR (bright red blood per rectum) 11/01/2016  . CAD (coronary artery disease)    a. s/p CABG in 1979 and 1993;  b. LHC (5/14):  LM, LAD, CFX and RCA occluded; L-LAD ok, dLAD occluded after insertion of LIMA, S-OM occluded, S-PDA/AM 80-90 => PCI with Promus DES; EF 25%  . Cardiomyopathy, ischemic 06/05/2013  . Cerumen impaction    Bilateral  . Chicken pox as a child  . Chronic systolic CHF (congestive heart failure) ([place])   . COLONIC POLYPS 07/01/2008   Qualifier: Diagnosis of  By: Lenna Gilford MD, Deborra Medina   . Degenerative joint disease   . DEGENERATIVE JOINT DISEASE 11/16/2007   Qualifier: Diagnosis of  By: Julien Girt CMA, Marliss Czar    . Diverticulosis of colon   . DIVERTICULOSIS OF COLON 07/01/2008   Qualifier: Diagnosis of  By: Lenna Gilford MD, Deborra Medina   . Double vision 03/07/2017  . Essential hypertension 11/16/2007   Qualifier: Diagnosis of  By: Julien Girt CMA, Marliss Czar    . Fingernail abnormalities 03/07/2017  . FLANK PAIN, RIGHT 02/03/2010   Qualifier: History of  By: Lenna Gilford MD,  Deborra Medina   . GERD (gastroesophageal reflux disease)   . GOUT 11/16/2007   Qualifier: Diagnosis of  By: Julien Girt CMA, Marliss Czar    . Hearing loss 11/24/2014  . Heart murmur   . History of shingles 10/27/2017  . Hypercholesterolemia   . HYPERCHOLESTEROLEMIA 11/16/2007   Qualifier: Diagnosis of  By: Julien Girt CMA, Marliss Czar    . Ischemic cardiomyopathy    a. echo (09/05/13): EF 35%, diffuse HK worsened distal septal, mid/distal inferior and apical region, grade 1 diastolic dysfunction, mild LAE.    [name] Kidney stone 08/17/2011  . Loss of hearing   . Lumbar back pain   . Measles as a child  . Medicare annual wellness visit, subsequent 11/24/2014  Sees Dr Delman Cheadle for dermatology Sees Dr Roni Bread of Urology Sees Dr Stanford Breed of cardiology Sees Dr Virginia Rochester of Opthamology No further colonoscopies warranted       . Mumps as a child  . Nephrolithiasis   . Pain in joint, lower leg 07/29/2014  . PERIPHERAL VASCULAR DISEASE 11/16/2007   Qualifier: Diagnosis of  By: Julien Girt CMA, Marliss Czar    . Peripheral vascular disease ([place])   . Rectal bleeding 03/07/2017  . Shingles 07/29/2014  . Sun-damaged skin 05/31/2014    Family History  Problem Relation Age of Onset  . Parkinsonism Brother   . Diabetes Maternal Grandmother   . Depression Daughter   . Other Son        4 stents  . Heart disease Son   . Diabetes Son        type 2  . Colon cancer Neg Hx   . Esophageal cancer Neg Hx   . Rectal cancer Neg Hx   . Stomach cancer Neg Hx     Past Surgical History:  Procedure Laterality Date  . CORONARY ANGIOPLASTY WITH STENT PLACEMENT  04/18/2013   RCA       . CORONARY ARTERY BYPASS GRAFT  1979   x4 SVG-DIAG-LAD, SVG-OM-PDA  . CORONARY ARTERY BYPASS GRAFT  1993   Redo x5 by Dr Harlow Asa; [facility], SVG-OM, SVG-AM-PL  . Decompressive laminectomy  01/2006   L2 - scarum by Dr. Shellia Carwin  . HEMORRHOID SURGERY     fissure with hemorrhoid corrected at age 5  . INGUINAL HERNIA REPAIR  1994   Right by Dr Harlow Asa  . INGUINAL HERNIA REPAIR   1996   Left by Dr. Harlow Asa  . LEFT HEART CATHETERIZATION WITH CORONARY ANGIOGRAM N/A 09/23/2013   Procedure: LEFT HEART CATHETERIZATION WITH CORONARY ANGIOGRAM;  Surgeon: Blane Ohara, MD;  Location: [facility] C[facility] LAB;  Service: Cardiovascular;  Laterality: N/A;  . lens implants     for vision correction  . PERCUTANEOUS CORONARY STENT INTERVENTION (PCI-S) N/A 04/18/2013   Procedure: PERCUTANEOUS CORONARY STENT INTERVENTION (PCI-S);  Surgeon: Sherren Mocha, MD;  Location[facility] C[facility] LAB;  Service: Cardiovascular;  Laterality: N/A;  . TONSILLECTOMY     Social History   Occupational History  . 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 Smoking status: Former Smoker    Quit date: 11/28/1944    Years since quitting: 75.4  . Smokeless tobacco: Never Used  Substance and Sexual Activity  . Alcohol use: Yes    Alcohol/week: 2.0 standard drinks    Types: 2 Standard drinks or equivalent per week    Comment: daily rum  or wine  . Drug use: No  . Sexual activity: Not Currently    Comment: lives with wife, no dietary restrictions.

## 2020-04-22 ENCOUNTER — Other Ambulatory Visit: Payer: Self-pay

## 2020-04-22 ENCOUNTER — Encounter: Payer: Self-pay | Admitting: Physical Therapy

## 2020-04-22 ENCOUNTER — Ambulatory Visit: Payer: Medicare Other | Attending: Physician Assistant | Admitting: Physical Therapy

## 2020-04-22 VITALS — BP 110/64 | HR 56

## 2020-04-22 DIAGNOSIS — R29898 Other symptoms and signs involving the musculoskeletal system: Secondary | ICD-10-CM | POA: Diagnosis not present

## 2020-04-22 DIAGNOSIS — R293 Abnormal posture: Secondary | ICD-10-CM | POA: Diagnosis not present

## 2020-04-22 DIAGNOSIS — M25551 Pain in right hip: Secondary | ICD-10-CM

## 2020-04-22 DIAGNOSIS — M25552 Pain in left hip: Secondary | ICD-10-CM

## 2020-04-22 DIAGNOSIS — M545 Low back pain, unspecified: Secondary | ICD-10-CM

## 2020-04-22 NOTE — Progress Notes (Signed)
Subjective:   Daniel Reeves is a 86 y.o. male who presents for Medicare Annual/Subsequent preventive examination.  Review of Systems:   Home Safety/Smoke Alarms: Feels safe in home. Smoke alarms in place.  Lives alone in 1 story home. Dtr lives close and checks on him daily and brings him lots of food and books.   Male:   PSA-  Lab Results  Component Value Date   PSA 25.19 (H) 04/03/2020   PSA 18.32 (H) 03/20/2020   PSA 4.29 (H) 02/08/2011       Objective:    Vitals: BP 120/80 (BP Location: Right Arm, Patient Position: Sitting, Cuff Size: Normal) Comment: all vitals done by S.Richardson CMA  Pulse (!) 55   Temp 98.2 F (36.8 C) (Temporal)   Ht 6' (1.829 m)   Wt 155 lb 9.6 oz (70.6 kg)   SpO2 97%   BMI 21.10 kg/m   Body mass index is 21.1 kg/m.  Advanced Directives 04/23/2020 04/22/2020 02/28/2020 11/30/2019 05/26/2019 07/01/2016 06/16/2015  Does Patient Have a Medical Advance Directive? Yes Yes No Yes No Yes No  Type of Paramedic of [place];Living will - - [place];Living will - [place];Living will -  Does patient want to make changes to medical advance directive? No - Patient declined No - Patient declined - - - - -  Copy of [place] in Chart? Yes - validated most recent copy scanned in chart (See row information) - - - - No - copy requested -  Would patient like information on creating a medical advance directive? - - - - - - No - patient declined information  Pre-existing out of facility DNR order (yellow form or pink MOST form) - - - - - - -    Tobacco Social History   Tobacco Use  Smoking Status Former Smoker  . Quit date: 11/28/1944  . Years since quitting: 75.4  Smokeless Tobacco Never Used     Counseling given: Not Answered   Clinical Intake:     Pain : No/denies pain                 Past Medical History:  Diagnosis Date  . Abdominal aortic aneurysm  ([place])    a. [place] (1/14):  3.3 x 3.4 cm => f/u 11/2013  . Amebic dysentery   . AMEBIC DYSENTERY 11/19/2007   Qualifier: History of  By: Lenna Gilford MD, Deborra Medina   . Anemia 03/07/2017  . ANXIETY 11/19/2007   Qualifier: Diagnosis of  By: Lenna Gilford MD, Deborra Medina   . Arthritis 03/07/2017  . 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 BACK PAIN, LUMBAR 11/16/2007   Qualifier: Diagnosis of  By: Julien Girt CMA, Leigh    . Benign prostatic hypertrophy   . BENIGN PROSTATIC HYPERTROPHY, HX OF 11/16/2007   Qualifier: Diagnosis of  By: Julien Girt CMA, Leigh    . BRBPR (bright red blood per rectum) 11/01/2016  . CAD (coronary artery disease)    a. s/p CABG in 1979 and 1993;  b. LHC (5/14):  LM, LAD, CFX and RCA occluded; L-LAD ok, dLAD occluded after insertion of LIMA, S-OM occluded, S-PDA/AM 80-90 => PCI with Promus DES; EF 25%  . Cardiomyopathy, ischemic 06/05/2013  . Cerumen impaction    Bilateral  . Chicken pox as a child  . Chronic systolic CHF (congestive heart failure) ([place])   . COLONIC POLYPS 07/01/2008   Qualifier:  Diagnosis of  By: Lenna Gilford MD, Deborra Medina   . Degenerative joint disease   . DEGENERATIVE JOINT DISEASE 11/16/2007   Qualifier: Diagnosis of  By: Julien Girt CMA, Marliss Czar    . Diverticulosis of colon   . DIVERTICULOSIS OF COLON 07/01/2008   Qualifier: Diagnosis of  By: Lenna Gilford MD, Deborra Medina   . Double vision 03/07/2017  . Essential hypertension 11/16/2007   Qualifier: Diagnosis of  By: Julien Girt CMA, Marliss Czar    . Fingernail abnormalities 03/07/2017  . FLANK PAIN, RIGHT 02/03/2010   Qualifier: History of  By: Lenna Gilford MD, Deborra Medina   . GERD (gastroesophageal reflux disease)   . GOUT 11/16/2007   Qualifier: Diagnosis of  By: Julien Girt CMA, Marliss Czar    . Hearing loss 11/24/2014  . Heart murmur   . History of shingles 10/27/2017  . Hypercholesterolemia   . HYPERCHOLESTEROLEMIA 11/16/2007   Qualifier: Diagnosis of  By: Julien Girt CMA, Marliss Czar    . Ischemic cardiomyopathy    a. echo  (09/05/13): EF 35%, diffuse HK worsened distal septal, mid/distal inferior and apical region, grade 1 diastolic dysfunction, mild LAE.    [name] Kidney stone 08/17/2011  . Loss of hearing   . Lumbar back pain   . Measles as a child  . Medicare annual wellness visit, subsequent 11/24/2014   Sees Dr Delman Cheadle for dermatology Sees Dr Roni Bread of Urology Sees Dr Stanford Breed of cardiology Sees Dr Virginia Rochester of Opthamology No further colonoscopies warranted       . Mumps as a child  . Nephrolithiasis   . Pain in joint, lower leg 07/29/2014  . PERIPHERAL VASCULAR DISEASE 11/16/2007   Qualifier: Diagnosis of  By: Julien Girt CMA, Marliss Czar    . Peripheral vascular disease ([place])   . Rectal bleeding 03/07/2017  . Shingles 07/29/2014  . Sun-damaged skin 05/31/2014   Past Surgical History:  Procedure Laterality Date  . CORONARY ANGIOPLASTY WITH STENT PLACEMENT  04/18/2013   RCA       . CORONARY ARTERY BYPASS GRAFT  1979   x4 SVG-DIAG-LAD, SVG-OM-PDA  . CORONARY ARTERY BYPASS GRAFT  1993   Redo x5 by Dr Harlow Asa; [facility], SVG-OM, SVG-AM-PL  . Decompressive laminectomy  01/2006   L2 - scarum by Dr. Shellia Carwin  . HEMORRHOID SURGERY     fissure with hemorrhoid corrected at age 54  . INGUINAL HERNIA REPAIR  1994   Right by Dr Harlow Asa  . INGUINAL HERNIA REPAIR  1996   Left by Dr. Harlow Asa  . LEFT HEART CATHETERIZATION WITH CORONARY ANGIOGRAM N/A 09/23/2013   Procedure: LEFT HEART CATHETERIZATION WITH CORONARY ANGIOGRAM;  Surgeon: Blane Ohara, MD;  Location: [facility] CATH LAB;  Service: Cardiovascular;  Laterality: N/A;  . lens implants     for vision correction  . PERCUTANEOUS CORONARY STENT INTERVENTION (PCI-S) N/A 04/18/2013   Procedure: PERCUTANEOUS CORONARY STENT INTERVENTION (PCI-S);  Surgeon: Sherren Mocha, MD;  Location[facility] C[facility] LAB;  Service: Cardiovascular;  Laterality: N/A;  . TONSILLECTOMY     Family History  Problem Relation Age of Onset  . Parkinsonism Brother   . Diabetes Maternal Grandmother   . Depression Daughter    . Other Son        4 stents  . Heart disease Son   . Diabetes Son        type 2  . Colon cancer Neg Hx   . Esophageal cancer Neg Hx   . Rectal cancer Neg Hx   . Stomach cancer Neg Hx    Social History  Socioeconomic History  . Marital status: Widowed    Spouse name: [name] x 72 years  . Number of children: 7  . Years of education: Not on file  . Highest education level: Not on file  Occupational History  . 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 Smoking status: Former Smoker    Quit date: 11/28/1944    Years since quitting: 75.4  . Smokeless tobacco: Never Used  Substance and Sexual Activity  . Alcohol use: Yes    Alcohol/week: 2.0 standard drinks    Types: 2 Standard drinks or equivalent per week    Comment: daily rum  or wine  . Drug use: No  . Sexual activity: Not Currently    Comment: lives with wife, no dietary restrictions.   Other Topics Concern  . Not on file  Social History Narrative   Married   7 children   Social Determinants of Health   Financial Resource Strain: Low Risk   . Difficulty of Paying Living Expenses: Not hard at all  Food Insecurity: No Food Insecurity  . Worried About Charity fundraiser in the Last Year: Never true  . Ran Out of Food in the Last Year: Never true  Transportation Needs: No Transportation Needs  . Lack of Transportation (Medical): No  . Lack of Transportation (Non-Medical): No  Physical Activity:   . Days of Exercise per Week:   . Minutes of Exercise per Session:   Stress:   . Feeling of Stress :   Social Connections:   . Frequency of Communication with Friends and Family:   . Frequency of Social Gatherings with Friends and Family:   . Attends Religious Services:   . Active Member of Clubs or Organizations:   . Attends Archivist Meetings:   [name] Marital Status:     Outpatient Encounter Medications as of 04/23/2020  Medication Sig  . AMBULATORY NON FORMULARY MEDICATION  Place 30 g rectally 2 (two) times daily. Medication Name: Nitroglycerin 0.125% gel. Apply pea size amount to the rectum two times a day for 6-8 weeks Avoid exercise within 30 minutes of application.  [name] aspirin EC 81 MG tablet Take 81 mg by mouth every morning.   . B Complex-C (B-COMPLEX WITH VITAMIN C) tablet Take 1 tablet by mouth daily.   . Cholecalciferol (VITAMIN D-3 PO) Take 5,000 Units by mouth daily with breakfast.   . famotidine (PEPCID) 20 MG tablet Take 1 tablet (20 mg total) by mouth 2 (two) times daily.  . finasteride (PROSCAR) 5 MG tablet Takes every third day  . folic acid (FOLVITE) A999333 MCG tablet Take 400 mcg by mouth 2 (two) times daily.   [name] HYDROcodone-acetaminophen (NORCO/VICODIN) 5-325 MG tablet Take 0.5 tablets by mouth at bedtime.  . isosorbide mononitrate (IMDUR) 30 MG 24 hr tablet Take 3 tablets by mouth once daily  . losartan (COZAAR) 50 MG tablet Take 1 tablet (50 mg total) by mouth daily.  . metoprolol succinate (TOPROL-XL) 25 MG 24 hr tablet Take 1/2 (one-half) tablet by mouth once daily  . Misc Natural Products (OSTEO BI-FLEX ADV JOINT SHIELD) TABS Take 1 tablet by mouth 2 (two) times daily.   . nitroGLYCERIN (NITROSTAT) 0.4 MG SL tablet Place 1 tablet (0.4 mg total) under the tongue every 5 (five) minutes as needed. For chest pain.  . simvastatin (ZOCOR) 40 MG tablet TAKE 1 TABLET BY MOUTH ONCE DAILY IN THE EVENING  . allopurinol (ZYLOPRIM) 300  MG tablet Take 1 tablet by mouth once daily  . [DISCONTINUED] methylPREDNISolone (MEDROL) 2 MG tablet Take as directed  . [DISCONTINUED] sulfamethoxazole-trimethoprim (BACTRIM DS) 800-160 MG tablet Take 1 tablet by mouth 2 (two) times daily.  . [DISCONTINUED] traMADol (ULTRAM) 50 MG tablet Take 1 tablet (50 mg total) by mouth at bedtime.   No facility-administered encounter medications on file as of 04/23/2020.    Activities of Daily Living In your present state of health, do you have any difficulty performing the following  activities: 04/23/2020  Hearing? N  Vision? N  Difficulty concentrating or making decisions? N  Walking or climbing stairs? N  Dressing or bathing? N  Doing errands, shopping? N  Preparing Food and eating ? N  Using the Toilet? N  In the past six months, have you accidently leaked urine? N  Do you have problems with loss of bowel control? N  Managing your Medications? N  Managing your Finances? N  Housekeeping or managing your Housekeeping? N  Some recent data might be hidden    Patient Care Team: [name], MD as PCP - General (Family Medicine) Irine Seal, MD as Consulting Physician (Urology) Stanford Breed Denice Bors, MD as Consulting Physician (Cardiology) Iona Beard, [place] as Consulting Physician (Optometry) Arlyn Leak, MD (Dentistry)   Assessment:   This is a routine wellness examination for Daniel Reeves. Physical assessment deferred to PCP.  Exercise Activities and Dietary recommendations Current Exercise Habits: The patient does not participate in regular exercise at present, Exercise limited by: None identified Diet (meal preparation, eat out, water intake, caffeinated beverages, dairy products, fruits and vegetables): in general, a "healthy" diet  , well balanced   Goals    . Patient Stated (pt-stated)     Maintain current state of health.       Fall Risk Fall Risk  04/23/2020 10/16/2018 11/02/2017 10/27/2017 07/01/2016  Falls in the past year? 0 1 No Yes Yes  Comment - Emmi Telephone Survey: data to providers prior to load Emmi Telephone Survey: data to providers prior to load - -  Number falls in past yr: 0 1 - 2 or more 1  Comment - Emmi Telephone Survey Actual Response = 1 - - Fell on ice at home, was carrying a box and could not catch.  Injury with Fall? 0 0 - No No  Risk for fall due to : - - - Impaired balance/gait -  Follow up Education provided;Falls prevention discussed - - Falls prevention discussed -   Depression Screen PHQ 2/9 Scores 04/23/2020  10/27/2017 07/01/2016 11/24/2014  PHQ - 2 Score 1 0 0 0    Cognitive Function Ad8 score reviewed for issues:  Issues making decisions:no  Less interest in hobbies / activities:no  Repeats questions, stories (family complaining):no  Trouble using ordinary gadgets (microwave, computer, phone):no  Forgets the month or year: no  Mismanaging finances: no  Remembering appts:no  Daily problems with thinking and/or memory:no Ad8 score is=0     MMSE - Mini Mental State Exam 07/01/2016  Orientation to time 4  Orientation to Place 5  Registration 3  Attention/ Calculation 5  Recall 3  Language- name 2 objects 2  Language- repeat 1  Language- follow 3 step command 3  Language- read & follow direction 1  Write a sentence 1  Copy design 1  Total score 29        Immunization History  Administered Date(s) Administered  . Fluad Quad(high Dose 65+) 08/13/2019  . Influenza,  High Dose Seasonal PF 08/25/2016, 09/04/2017, 10/03/2018  . Influenza,inj,Quad PF,6+ Mos 10/10/2013, 11/24/2014, 11/16/2015  . PFIZER SARS-COV-2 Vaccination 01/30/2020, 02/26/2020  . Pneumococcal Conjugate-13 11/16/2015  . Pneumococcal Polysaccharide-23 07/11/2013  . Tdap 07/11/2013, 04/26/2019  . Zoster 12/17/2015    Screening Tests Health Maintenance  Topic Date Due  . INFLUENZA VACCINE  06/28/2020  . TETANUS/TDAP  04/25/2029  . COVID-19 Vaccine  Completed  . PNA vac Low Risk Adult  Completed       Plan:    Please schedule your next medicare wellness visit with me in 1 yr.  Continue to eat heart healthy diet (full of fruits, vegetables, whole grains, lean protein, water--limit salt, fat, and sugar intake) and increase physical activity as tolerated.  Continue doing brain stimulating activities (puzzles, reading, adult coloring books, staying active) to keep memory sharp.     I have personally reviewed and noted the following in the patient's chart:   . Medical and social history . Use of  alcohol, tobacco or illicit drugs  . Current medications and supplements . Functional ability and status . Nutritional status . Physical activity . Advanced directives . List of other physicians . Hospitalizations, surgeries, and ER visits in previous 12 months . Vitals . Screenings to include cognitive, depression, and falls . Referrals and appointments  In addition, I have reviewed and discussed with patient certain preventive protocols, quality metrics, and best practice recommendations. A written personalized care plan for preventive services as well as general preventive health recommendations were provided to patient.     Naaman Plummer [place], [place]  04/23/2020

## 2020-04-22 NOTE — Therapy (Signed)
[facility] [address]  [place], [place], [address] Phone: [phone]   Fax:  [phone]  Physical Therapy Evaluation  Patient Details  Name: Daniel Reeves MRN: [id number] Date of Birth: [birth year] Referring Provider (PT): Erskine Emery, PA-C   Encounter Date: 04/22/2020  PT End of Session - 04/22/20 1132    Visit Number  1    Number of Visits  13    Date for PT Re-Evaluation  06/03/20    Authorization Type  Medicare, AARP, BCBS    PT Start Time  [phone]    PT Stop Time  1014    PT Time Calculation (min)  43 min    Activity Tolerance  Patient tolerated treatment well;Patient limited by pain    Behavior During Therapy  [facility] for tasks assessed/performed       Past Medical History:  Diagnosis Date  . Abdominal aortic aneurysm ([place])    a. [place] (1/14):  3.3 x 3.4 cm => f/u 11/2013  . Amebic dysentery   . AMEBIC DYSENTERY 11/19/2007   Qualifier: History of  By: Lenna Gilford MD, Deborra Medina   . Anemia 03/07/2017  . ANXIETY 11/19/2007   Qualifier: Diagnosis of  By: Lenna Gilford MD, Deborra Medina   . Arthritis 03/07/2017  . 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 BACK PAIN, LUMBAR 11/16/2007   Qualifier: Diagnosis of  By: Julien Girt CMA, Leigh    . Benign prostatic hypertrophy   . BENIGN PROSTATIC HYPERTROPHY, HX OF 11/16/2007   Qualifier: Diagnosis of  By: Julien Girt CMA, Leigh    . BRBPR (bright red blood per rectum) 11/01/2016  . CAD (coronary artery disease)    a. s/p CABG in 1979 and 1993;  b. LHC (5/14):  LM, LAD, CFX and RCA occluded; L-LAD ok, dLAD occluded after insertion of LIMA, S-OM occluded, S-PDA/AM 80-90 => PCI with Promus DES; EF 25%  . Cardiomyopathy, ischemic 06/05/2013  . Cerumen impaction    Bilateral  . Chicken pox as a child  . Chronic systolic CHF (congestive heart failure) ([place])   . COLONIC POLYPS 07/01/2008   Qualifier: Diagnosis of  By: Lenna Gilford MD, Deborra Medina   .  Degenerative joint disease   . DEGENERATIVE JOINT DISEASE 11/16/2007   Qualifier: Diagnosis of  By: Julien Girt CMA, Marliss Czar    . Diverticulosis of colon   . DIVERTICULOSIS OF COLON 07/01/2008   Qualifier: Diagnosis of  By: Lenna Gilford MD, Deborra Medina   . Double vision 03/07/2017  . Essential hypertension 11/16/2007   Qualifier: Diagnosis of  By: Julien Girt CMA, Marliss Czar    . Fingernail abnormalities 03/07/2017  . FLANK PAIN, RIGHT 02/03/2010   Qualifier: History of  By: Lenna Gilford MD, Deborra Medina   . GERD (gastroesophageal reflux disease)   . GOUT 11/16/2007   Qualifier: Diagnosis of  By: Julien Girt CMA, Marliss Czar    . Hearing loss 11/24/2014  . Heart murmur   . History of shingles 10/27/2017  . Hypercholesterolemia   . HYPERCHOLESTEROLEMIA 11/16/2007   Qualifier: Diagnosis of  By: Julien Girt CMA, Marliss Czar    . Ischemic cardiomyopathy    a. echo (09/05/13): EF 35%, diffuse HK worsened distal septal, mid/distal inferior and apical region, grade 1 diastolic dysfunction, mild LAE.    [name] Kidney stone 08/17/2011  . Loss of hearing   . Lumbar back pain   . Measles as a child  . Medicare annual wellness visit, subsequent 11/24/2014   Sees  Dr Delman Cheadle for dermatology Sees Dr Roni Bread of Urology Sees Dr Stanford Breed of cardiology Sees Dr Virginia Rochester of Opthamology No further colonoscopies warranted       . Mumps as a child  . Nephrolithiasis   . Pain in joint, lower leg 07/29/2014  . PERIPHERAL VASCULAR DISEASE 11/16/2007   Qualifier: Diagnosis of  By: Julien Girt CMA, Marliss Czar    . Peripheral vascular disease ([place])   . Rectal bleeding 03/07/2017  . Shingles 07/29/2014  . Sun-damaged skin 05/31/2014    Past Surgical History:  Procedure Laterality Date  . CORONARY ANGIOPLASTY WITH STENT PLACEMENT  04/18/2013   RCA       . CORONARY ARTERY BYPASS GRAFT  1979   x4 SVG-DIAG-LAD, SVG-OM-PDA  . CORONARY ARTERY BYPASS GRAFT  1993   Redo x5 by Dr Harlow Asa; [facility], SVG-OM, SVG-AM-PL  . Decompressive laminectomy  01/2006   L2 - scarum by Dr. Shellia Carwin  . HEMORRHOID  SURGERY     fissure with hemorrhoid corrected at age 56  . INGUINAL HERNIA REPAIR  1994   Right by Dr Harlow Asa  . INGUINAL HERNIA REPAIR  1996   Left by Dr. Harlow Asa  . LEFT HEART CATHETERIZATION WITH CORONARY ANGIOGRAM N/A 09/23/2013   Procedure: LEFT HEART CATHETERIZATION WITH CORONARY ANGIOGRAM;  Surgeon: Blane Ohara, MD;  Location[facility] C[facility] LAB;  Service: Cardiovascular;  Laterality: N/A;  . lens implants     for vision correction  . PERCUTANEOUS CORONARY STENT INTERVENTION (PCI-S) N/A 04/18/2013   Procedure: PERCUTANEOUS CORONARY STENT INTERVENTION (PCI-S);  Surgeon: Sherren Mocha, MD;  Location: [facility] CATH LAB;  Service: Cardiovascular;  Laterality: N/A;  . TONSILLECTOMY      Vitals:   04/22/20 0933  BP: 110/64  Pulse: (!) 56  SpO2: 97%     Subjective Assessment - 04/22/20 0935    Subjective  Patient reports LBP first began from an episode of attempting to get his sit-down mower out of the mud with a 2x4 on 02/27/20.He was able to continue to mow his lawn without pain, but the following day he had to visit the ED d/t pain. Was advised that he has hip arthritis and scoliosis, which was not news to him. Had a cortisone shot yesterday in the L hip- has not noticed any benefit yet. Pain is located over the L>R lateral hip and across B sides of the LB. Pain is worse when "being active" like walking, standing, bending forward, lifting. Has used ice and heat with some benefit. Denies N/T or changes in B&B control.    Pertinent History  PCD, loss of hearing, kidney stone, ischemic cardiomyopathy, HLD, GERD, HTN, chronic systolic CHF, CAD, CABG [id number], 1993, 2014, anxiety, anemia, AAA, L2-sacrum decompresison/laminectomy 2007    Limitations  Sitting;Lifting;Standing;Walking    Diagnostic tests  lumbar xray 02/28/2020: no acute fractures.  Scoliosis with concavity to the left.  Prior laminectomies L2-S1.  No spondylolisthesis.    Patient Stated Goals  get rid of pain    Currently in Pain?  Yes     Pain Score  0-No pain    Pain Location  Back    Pain Orientation  Right;Left;Lower    Pain Type  Acute pain    Multiple Pain Sites  Yes    Pain Score  1    Pain Location  Hip    Pain Orientation  Left;Lateral    Pain Descriptors / Indicators  Dull    Pain Type  Acute pain  [facility] PT Assessment - 04/22/20 0945      Assessment   Medical Diagnosis  LBP with sciatica    Referring Provider (PT)  Erskine Emery, PA-C    Onset Date/Surgical Date  02/28/20    Next MD Visit  pt unsure    Prior Therapy  yes      Precautions   Precautions  --   HOH     Balance Screen   Has the patient fallen in the past 6 months  No    Has the patient had a decrease in activity level because of a fear of falling?   No    Is the patient reluctant to leave their home because of a fear of falling?   No      Home Environment   Living Environment  Private residence    Living Arrangements  Alone    Available Help at Discharge  Family    Type of [place] to enter    Entrance Stairs-Number of Steps  [place]  One level    Home Equipment  Wheelchair - [employer] - 2 wheels      Prior Function   Level of [place]  Retired    Leisure  working in the yard      Cognition   Overall Cognitive Status  Within Functional Limits for tasks assessed      Sensation   Light Touch  Appears Intact      Coordination   Gross Motor Movements are Fluid and Coordinated  Yes      Posture/Postural Control   Posture/Postural Control  Postural limitations    Postural Limitations  Rounded Shoulders;Forward head;Increased thoracic kyphosis    Posture Comments  L concave scoliotic curve      ROM / Strength   AROM / PROM / Strength  AROM;Strength      AROM   AROM Assessment Site  Lumbar    Lumbar Flexion  mid shin    Lumbar Extension  to neutral   sharp "jab" of pain in R buttock   Lumbar - Right Side Bend   distal thigh    Lumbar - Left Side Bend  distal thigh    Lumbar - Right Rotation  mildly limited    Lumbar - Left Rotation  mildly limited      Strength   Strength Assessment Site  Hip;Knee;Ankle    Right/Left Hip  Right;Left    Right Hip Flexion  4+/5    Right Hip ABduction  4+/5    Right Hip ADduction  4+/5    Left Hip Flexion  4+/5    Left Hip ABduction  4/5    Left Hip ADduction  4/5    Right/Left Knee  Right;Left    Right Knee Flexion  4+/5    Right Knee Extension  4+/5    Left Knee Flexion  4+/5    Left Knee Extension  4+/5    Right/Left Ankle  Right;Left    Right Ankle Dorsiflexion  4/5    Right Ankle Plantar Flexion  4/5    Left Ankle Dorsiflexion  4/5    Left Ankle Plantar Flexion  4/5      Flexibility   Soft Tissue Assessment /Muscle Length  yes    Hamstrings  L severely tight, R moderately tight    Piriformis  B  moderately tight fig 4, mildly tight KTOS      Palpation   Spinal mobility  L concave scoliotic curve    Palpation comment  significant increase in tone in B buttocks, lumbar paraspinals, QL; no TTP      Ambulation/Gait   Assistive device  None    Gait Pattern  Step-through pattern;Trunk flexed;Lateral hip instability                  Objective measurements completed on examination: See above findings.              PT Education - 04/22/20 1131    Education Details  prognosis, POC, HEP, advised patient to consult with PCP about his dizziness if it continues    Person(s) Educated  Patient    Methods  Explanation;Demonstration;Tactile cues;Verbal cues;Handout    Comprehension  Verbalized understanding;Returned demonstration       PT Short Term Goals - 04/22/20 1139      PT SHORT TERM GOAL #1   Title  Patient to be independent with initial HEP.    Time  3    Period  Weeks    Status  New    Target Date  05/13/20        PT Long Term Goals - 04/22/20 1139      PT LONG TERM GOAL #1   Title  Patient to be independent  with advanced HEP.    Time  6    Period  Weeks    Status  New    Target Date  06/03/20      PT LONG TERM GOAL #2   Title  Patient to demonstrate lumbar/B hip AROM WFL and without pain limiting.    Time  6    Period  Weeks    Status  New    Target Date  06/03/20      PT LONG TERM GOAL #3   Title  Patient to demonstrate B hip strength >/=4+/5.    Time  6    Period  Weeks    Status  New    Target Date  06/03/20      PT LONG TERM GOAL #4   Title  Patient to demonstrate and recall proper body mechanics when lifting 20lbs from floor.    Time  6    Period  Weeks    Status  New    Target Date  06/03/20      PT LONG TERM GOAL #5   Title  Patient to report tolerance for yard work activities with modifications as necessary.    Time  6    Period  Weeks    Status  New    Target Date  06/03/20             Plan - 04/22/20 1133    Clinical Impression Statement  Patient is a 86 y/o M presenting to [facility] with c/o acute B hip and LBP since 02/28/20 after attempting to move his sit-down mower out of a mud pit. Patient received an injection to the L hip yesterday, but not yet noting any benefit. Pain occurs over L>R lateral hip and across B sides of the LB. Aggravated by walking, standing, bending forward, lifting. Denies N/T or changes in B&B control. Patient is still quite active and lives alone. Upon laying supine during assessment, patient with c/o "the room is going around" which lasted 1 minute but without visible nystagmus. Patient without dysarthria, N/T, or chest pain  during this complaint. Able to proceed with assessment without any further complaints, but further vestibular assessment may be necessary if these symptoms continue. Patient today presenting with rounded and kyphotic posture, limited lumbar AROM, decreased L hip and ankle strength, decreased flexibility in B HS and piriformis, significant increase in tone in B buttocks, lumbar paraspinals, QL, and gait deviations. Patient  educated on stretching and lumbar mobility HEP- patient reported understanding. Would benefit from skilled PT services 2x/week for 4 weeks to address aforementioned impairments.    Personal Factors and Comorbidities  Age;Sex;Comorbidity 3+;Fitness;Past/Current Experience;Time since onset of injury/illness/exacerbation    Comorbidities  PCD, loss of hearing, kidney stone, ischemic cardiomyopathy, HLD, GERD, HTN, chronic systolic CHF, CAD, CABG [id number], 1993, 2014, anxiety, anemia, AAA, L2-sacrum decompresison/laminectomy 2007    Examination-Activity Limitations  Bend;Squat;Stairs;Carry;Stand;Dressing;Transfers;Hygiene/Grooming;Lift;Locomotion Level;Reach Overhead    Examination-Participation Restrictions  Church;Cleaning;Shop;Community Activity;Yard Work;Laundry;Meal Prep    Stability/Clinical Decision Making  Stable/Uncomplicated    Clinical Decision Making  Low    Rehab Potential  Good    PT Frequency  2x / week    PT Duration  6 weeks    PT Treatment/Interventions  ADLs/Self Care Home Management;Cryotherapy;Electrical Stimulation;Iontophoresis 4mg /ml Dexamethasone;Moist Heat;Balance training;Therapeutic exercise;Therapeutic activities;Functional mobility training;Stair training;Gait training;DME Instruction;Ultrasound;Neuromuscular re-education;Patient/family education;Manual techniques;Taping;Energy conservation;Dry needling;Passive range of motion;Canalith Repostioning    PT Next Visit Plan  reassess HEP; assess hip ROM and further assess hip strength; progress hip stretching and core strengthening    Consulted and Agree with Plan of Care  Patient       Patient will benefit from skilled therapeutic intervention in order to improve the following deficits and impairments:  Cardiopulmonary status limiting activity, Decreased endurance, Hypomobility, Decreased activity tolerance, Decreased strength, Pain, Difficulty walking, Increased muscle spasms, Improper body mechanics, Decreased range of motion,  Impaired flexibility, Postural dysfunction, Dizziness  Visit Diagnosis: Pain in left hip  Pain in right hip  Acute bilateral low back pain without sciatica  Other symptoms and signs involving the musculoskeletal system  Abnormal posture     Problem List Patient Active Problem List   Diagnosis Date Noted  . AKI (acute kidney injury) ([place]) 03/14/2020  . Exertional angina ([place]) 03/12/2020  . Grief reaction 12/24/2019  . Chest pain 11/30/2019  . Finger laceration 04/28/2019  . Leg swelling 03/09/2019  . Hyperglycemia 01/22/2019  . Otitis externa 01/22/2019  . Edema 12/17/2018  . Right hip pain 12/17/2018  . Scrotal bleeding 03/01/2018  . Foot pain, right 03/01/2018  . Hemorrhoid 10/29/2017  . History of shingles 10/27/2017  . Anemia 03/07/2017  . Double vision 03/07/2017  . Arthritis 03/07/2017  . Medicare annual wellness visit, subsequent 11/24/2014  . Hearing loss 11/24/2014  . Pain in joint, lower leg 07/29/2014  . Sun-damaged skin 05/31/2014  . Chicken pox   . Measles   . Mumps   . Cardiomyopathy, ischemic 06/05/2013  . Atherosclerosis of coronary artery bypass graft with unstable angina pectoris ([place]) 04/19/2013  . Kidney stone 08/17/2011  . CAD (coronary artery disease)   . CERUMEN IMPACTION, BILATERAL 02/03/2010  . FLANK PAIN, RIGHT 02/03/2010  . Abdominal aortic aneurysm ([place]) 11/04/2009  . COLONIC POLYPS 07/01/2008  . Anxiety state 11/19/2007  . HYPERCHOLESTEROLEMIA 11/16/2007  . Gout 11/16/2007  . Essential hypertension 11/16/2007  . PERIPHERAL VASCULAR DISEASE 11/16/2007  . GERD 11/16/2007  . Osteoarthritis 11/16/2007  . BACK PAIN, LUMBAR 11/16/2007  . BPH (benign prostatic hyperplasia) 11/16/2007     Janene Harvey, PT, DPT 04/22/20 11:46 AM   [facility] [address]  [place], [place], [address] Phone: [phone]   Fax:  [phone]  Name: Daniel Reeves MRN:  [id number] Date of Birth: June 26, 1924

## 2020-04-23 ENCOUNTER — Encounter: Payer: Self-pay | Admitting: *Deleted

## 2020-04-23 ENCOUNTER — Ambulatory Visit (INDEPENDENT_AMBULATORY_CARE_PROVIDER_SITE_OTHER): Payer: Medicare Other | Admitting: *Deleted

## 2020-04-23 ENCOUNTER — Ambulatory Visit (INDEPENDENT_AMBULATORY_CARE_PROVIDER_SITE_OTHER): Payer: Medicare Other | Admitting: Family Medicine

## 2020-04-23 VITALS — BP 120/80 | HR 55 | Temp 98.2°F | Ht 72.0 in | Wt 155.6 lb

## 2020-04-23 DIAGNOSIS — M25551 Pain in right hip: Secondary | ICD-10-CM

## 2020-04-23 DIAGNOSIS — Z0000 Encounter for general adult medical examination without abnormal findings: Secondary | ICD-10-CM

## 2020-04-23 DIAGNOSIS — H6983 Other specified disorders of Eustachian tube, bilateral: Secondary | ICD-10-CM | POA: Diagnosis not present

## 2020-04-23 DIAGNOSIS — E7800 Pure hypercholesterolemia, unspecified: Secondary | ICD-10-CM

## 2020-04-23 DIAGNOSIS — H6593 Unspecified nonsuppurative otitis media, bilateral: Secondary | ICD-10-CM | POA: Diagnosis not present

## 2020-04-23 DIAGNOSIS — I255 Ischemic cardiomyopathy: Secondary | ICD-10-CM | POA: Diagnosis not present

## 2020-04-23 DIAGNOSIS — H6590 Unspecified nonsuppurative otitis media, unspecified ear: Secondary | ICD-10-CM | POA: Insufficient documentation

## 2020-04-23 DIAGNOSIS — R739 Hyperglycemia, unspecified: Secondary | ICD-10-CM

## 2020-04-23 DIAGNOSIS — H938X3 Other specified disorders of ear, bilateral: Secondary | ICD-10-CM

## 2020-04-23 DIAGNOSIS — I10 Essential (primary) hypertension: Secondary | ICD-10-CM

## 2020-04-23 MED ORDER — CETIRIZINE HCL 10 MG PO TABS
10.0000 mg | ORAL_TABLET | Freq: Every day | ORAL | 2 refills | Status: DC | PRN
Start: 2020-04-23 — End: 2020-07-30

## 2020-04-23 MED ORDER — FLUTICASONE PROPIONATE 50 MCG/ACT NA SUSP: 2.0000 | Freq: Every day | NASAL | 2 refills | Status: DC | PRN

## 2020-04-23 NOTE — Patient Instructions (Signed)
Please schedule your next medicare wellness visit with me in 1 yr.  Continue to eat heart healthy diet (full of fruits, vegetables, whole grains, lean protein, water--limit salt, fat, and sugar intake) and increase physical activity as tolerated.  Continue doing brain stimulating activities (puzzles, reading, adult coloring books, staying active) to keep memory sharp.    Daniel Reeves , Thank you for taking time to come for your Medicare Wellness Visit. I appreciate your ongoing commitment to your health goals. Please review the following plan we discussed and let me know if I can assist you in the future.   These are the goals we discussed: Goals    . Patient Stated (pt-stated)     Maintain current state of health.       This is a list of the screening recommended for you and due dates:  Health Maintenance  Topic Date Due  . Flu Shot  06/28/2020  . Tetanus Vaccine  04/25/2029  . COVID-19 Vaccine  Completed  . Pneumonia vaccines  Completed    Preventive Care 31 Years and Older, Male Preventive care refers to lifestyle choices and visits with your health care provider that can promote health and wellness. This includes:  A yearly physical exam. This is also called an annual well check.  Regular dental and eye exams.  Immunizations.  Screening for certain conditions.  Healthy lifestyle choices, such as diet and exercise. What can I expect for my preventive care visit? Physical exam Your health care provider will check:  Height and weight. These may be used to calculate body mass index (BMI), which is a measurement that tells if you are at a healthy weight.  Heart rate and blood pressure.  Your skin for abnormal spots. Counseling Your health care provider may ask you questions about:  Alcohol, tobacco, and drug use.  Emotional well-being.  Home and relationship well-being.  Sexual activity.  Eating habits.  History of falls.  Memory and ability to understand  (cognition).  Work and work Statistician. What immunizations do I need?  Influenza (flu) vaccine  This is recommended every year. Tetanus, diphtheria, and pertussis (Tdap) vaccine  You may need a Td booster every 10 years. Varicella (chickenpox) vaccine  You may need this vaccine if you have not already been vaccinated. Zoster (shingles) vaccine  You may need this after age 69. Pneumococcal conjugate (PCV13) vaccine  One dose is recommended after age 21. Pneumococcal polysaccharide (PPSV23) vaccine  One dose is recommended after age 79. Measles, mumps, and rubella (MMR) vaccine  You may need at least one dose of MMR if you were born in 1957 or later. You may also need a second dose. Meningococcal conjugate (MenACWY) vaccine  You may need this if you have certain conditions. Hepatitis A vaccine  You may need this if you have certain conditions or if you travel or work in places where you may be exposed to hepatitis A. Hepatitis B vaccine  You may need this if you have certain conditions or if you travel or work in places where you may be exposed to hepatitis B. Haemophilus influenzae type b (Hib) vaccine  You may need this if you have certain conditions. You may receive vaccines as individual doses or as more than one vaccine together in one shot (combination vaccines). Talk with your health care provider about the risks and benefits of combination vaccines. What tests do I need? Blood tests  Lipid and cholesterol levels. These may be checked every 5 years, or  more frequently depending on your overall health.  Hepatitis C test.  Hepatitis B test. Screening  Lung cancer screening. You may have this screening every year starting at age 41 if you have a 30-pack-year history of smoking and currently smoke or have quit within the past 15 years.  Colorectal cancer screening. All adults should have this screening starting at age 1 and continuing until age 66. Your health  care provider may recommend screening at age 18 if you are at increased risk. You will have tests every 1-10 years, depending on your results and the type of screening test.  Prostate cancer screening. Recommendations will vary depending on your family history and other risks.  Diabetes screening. This is done by checking your blood sugar (glucose) after you have not eaten for a while (fasting). You may have this done every 1-3 years.  Abdominal aortic aneurysm (AAA) screening. You may need this if you are a current or former smoker.  Sexually transmitted disease (STD) testing. Follow these instructions at home: Eating and drinking  Eat a diet that includes fresh fruits and vegetables, whole grains, lean protein, and low-fat dairy products. Limit your intake of foods with high amounts of sugar, saturated fats, and salt.  Take vitamin and mineral supplements as recommended by your health care provider.  Do not drink alcohol if your health care provider tells you not to drink.  If you drink alcohol: ? Limit how much you have to 0-2 drinks a day. ? Be aware of how much alcohol is in your drink. In the [place] one drink equals one 12 oz bottle of beer (355 mL), one 5 oz glass of wine (148 mL), or one 1 oz glass of hard liquor (44 mL). Lifestyle  Take daily care of your teeth and gums.  Stay active. Exercise for at least 30 minutes on 5 or more days each week.  Do not use any products that contain nicotine or tobacco, such as cigarettes, e-cigarettes, and chewing tobacco. If you need help quitting, ask your health care provider.  If you are sexually active, practice safe sex. Use a condom or other form of protection to prevent STIs (sexually transmitted infections).  Talk with your health care provider about taking a low-dose aspirin or statin. What's next?  Visit your health care provider once a year for a well check visit.  Ask your health care provider how often you should have your  eyes and teeth checked.  Stay up to date on all vaccines. This information is not intended to replace advice given to you by your health care provider. Make sure you discuss any questions you have with your health care provider. Document Revised: 11/08/2018 Document Reviewed: 11/08/2018 Elsevier Patient Education  2020 [employer].

## 2020-04-23 NOTE — Assessment & Plan Note (Signed)
Has a recurrent problem. Can consider using hydrogen peroxide into ears after the shower once a week. He complains on

## 2020-04-23 NOTE — Patient Instructions (Signed)
Eustachian Tube Dysfunction ° °Eustachian tube dysfunction refers to a condition in which a blockage develops in the narrow passage that connects the middle ear to the back of the nose (eustachian tube). The eustachian tube regulates air pressure in the middle ear by letting air move between the ear and nose. It also helps to drain fluid from the middle ear space. °Eustachian tube dysfunction can affect one or both ears. When the eustachian tube does not function properly, air pressure, fluid, or both can build up in the middle ear. °What are the causes? °This condition occurs when the eustachian tube becomes blocked or cannot open normally. Common causes of this condition include: °· Ear infections. °· Colds and other infections that affect the nose, mouth, and throat (upper respiratory tract). °· Allergies. °· Irritation from cigarette smoke. °· Irritation from stomach acid coming up into the esophagus (gastroesophageal reflux). The esophagus is the tube that carries food from the mouth to the stomach. °· Sudden changes in air pressure, such as from descending in an airplane or scuba diving. °· Abnormal growths in the nose or throat, such as: °? Growths that line the nose (nasal polyps). °? Abnormal growth of cells (tumors). °? Enlarged tissue at the back of the throat (adenoids). °What increases the risk? °You are more likely to develop this condition if: °· You smoke. °· You are overweight. °· You are a child who has: °? Certain birth defects of the mouth, such as cleft palate. °? Large tonsils or adenoids. °What are the signs or symptoms? °Common symptoms of this condition include: °· A feeling of fullness in the ear. °· Ear pain. °· Clicking or popping noises in the ear. °· Ringing in the ear. °· Hearing loss. °· Loss of balance. °· Dizziness. °Symptoms may get worse when the air pressure around you changes, such as when you travel to an area of high elevation, fly on an airplane, or go scuba diving. °How is  this diagnosed? °This condition may be diagnosed based on: °· Your symptoms. °· A physical exam of your ears, nose, and throat. °· Tests, such as those that measure: °? The movement of your eardrum (tympanogram). °? Your hearing (audiometry). °How is this treated? °Treatment depends on the cause and severity of your condition. °· In mild cases, you may relieve your symptoms by moving air into your ears. This is called "popping the ears." °· In more severe cases, or if you have symptoms of fluid in your ears, treatment may include: °? Medicines to relieve congestion (decongestants). °? Medicines that treat allergies (antihistamines). °? Nasal sprays or ear drops that contain medicines that reduce swelling (steroids). °? A procedure to drain the fluid in your eardrum (myringotomy). In this procedure, a small tube is placed in the eardrum to: °§ Drain the fluid. °§ Restore the air in the middle ear space. °? A procedure to insert a balloon device through the nose to inflate the opening of the eustachian tube (balloon dilation). °Follow these instructions at home: °Lifestyle °· Do not do any of the following until your health care provider approves: °? Travel to high altitudes. °? Fly in airplanes. °? Work in a pressurized cabin or room. °? Scuba dive. °· Do not use any products that contain nicotine or tobacco, such as cigarettes and e-cigarettes. If you need help quitting, ask your health care provider. °· Keep your ears dry. Wear fitted earplugs during showering and bathing. Dry your ears completely after. °General instructions °· Take over-the-counter   and prescription medicines only as told by your health care provider. °· Use techniques to help pop your ears as recommended by your health care provider. These may include: °? Chewing gum. °? Yawning. °? Frequent, forceful swallowing. °? Closing your mouth, holding your nose closed, and gently blowing as if you are trying to blow air out of your nose. °· Keep all  follow-up visits as told by your health care provider. This is important. °Contact a health care provider if: °· Your symptoms do not go away after treatment. °· Your symptoms come back after treatment. °· You are unable to pop your ears. °· You have: °? A fever. °? Pain in your ear. °? Pain in your head or neck. °? Fluid draining from your ear. °· Your hearing suddenly changes. °· You become very dizzy. °· You lose your balance. °Summary °· Eustachian tube dysfunction refers to a condition in which a blockage develops in the eustachian tube. °· It can be caused by ear infections, allergies, inhaled irritants, or abnormal growths in the nose or throat. °· Symptoms include ear pain, hearing loss, or ringing in the ears. °· Mild cases are treated with maneuvers to unblock the ears, such as yawning or ear popping. °· Severe cases are treated with medicines. Surgery may also be done (rare). °This information is not intended to replace advice given to you by your health care provider. Make sure you discuss any questions you have with your health care provider. °Document Revised: 03/06/2018 Document Reviewed: 03/06/2018 °Elsevier Patient Education © 2020 [employer] ° °

## 2020-04-27 NOTE — Assessment & Plan Note (Signed)
hgba1c acceptable, minimize simple carbs. Increase exercise as tolerated.  

## 2020-04-27 NOTE — Progress Notes (Signed)
Subjective:    Patient ID: Daniel Reeves, male    DOB: 08-23-1924, 86 y.o.   MRN: [id number]  Chief Complaint  Patient presents with  . Follow-up    HPI Patient is in today for follow up and discussion of increased pressure in ears and a crackling sensation at times. No fevers or chills. No recent hospitalizations. He was having some left hip pain and a steroid shot was given. No significant improvement yet. No recent falls or injury. He was seen by general surgery regarding his abdominal discomfort and no surgical intervention was recommended. Pain is actually some better. Denies CP/palp/SOB/HA/congestion/fevers/GI or GU c/o. Taking meds as prescribed  Past Medical History:  Diagnosis Date  . Abdominal aortic aneurysm ([place])    a. [place] (1/14):  3.3 x 3.4 cm => f/u 11/2013  . Amebic dysentery   . AMEBIC DYSENTERY 11/19/2007   Qualifier: History of  By: Lenna Gilford MD, Deborra Medina   . Anemia 03/07/2017  . ANXIETY 11/19/2007   Qualifier: Diagnosis of  By: Lenna Gilford MD, Deborra Medina   . Arthritis 03/07/2017  . 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 BACK PAIN, LUMBAR 11/16/2007   Qualifier: Diagnosis of  By: Julien Girt CMA, Leigh    . Benign prostatic hypertrophy   . BENIGN PROSTATIC HYPERTROPHY, HX OF 11/16/2007   Qualifier: Diagnosis of  By: Julien Girt CMA, Leigh    . BRBPR (bright red blood per rectum) 11/01/2016  . CAD (coronary artery disease)    a. s/p CABG in 1979 and 1993;  b. LHC (5/14):  LM, LAD, CFX and RCA occluded; L-LAD ok, dLAD occluded after insertion of LIMA, S-OM occluded, S-PDA/AM 80-90 => PCI with Promus DES; EF 25%  . Cardiomyopathy, ischemic 06/05/2013  . Cerumen impaction    Bilateral  . Chicken pox as a child  . Chronic systolic CHF (congestive heart failure) ([place])   . COLONIC POLYPS 07/01/2008   Qualifier: Diagnosis of  By: Lenna Gilford MD, Deborra Medina   . Degenerative joint disease   . DEGENERATIVE JOINT DISEASE 11/16/2007   Qualifier:  Diagnosis of  By: Julien Girt CMA, Marliss Czar    . Diverticulosis of colon   . DIVERTICULOSIS OF COLON 07/01/2008   Qualifier: Diagnosis of  By: Lenna Gilford MD, Deborra Medina   . Double vision 03/07/2017  . Essential hypertension 11/16/2007   Qualifier: Diagnosis of  By: Julien Girt CMA, Marliss Czar    . Fingernail abnormalities 03/07/2017  . FLANK PAIN, RIGHT 02/03/2010   Qualifier: History of  By: Lenna Gilford MD, Deborra Medina   . GERD (gastroesophageal reflux disease)   . GOUT 11/16/2007   Qualifier: Diagnosis of  By: Julien Girt CMA, Marliss Czar    . Hearing loss 11/24/2014  . Heart murmur   . History of shingles 10/27/2017  . Hypercholesterolemia   . HYPERCHOLESTEROLEMIA 11/16/2007   Qualifier: Diagnosis of  By: Julien Girt CMA, Marliss Czar    . Ischemic cardiomyopathy    a. echo (09/05/13): EF 35%, diffuse HK worsened distal septal, mid/distal inferior and apical region, grade 1 diastolic dysfunction, mild LAE.    [name] Kidney stone 08/17/2011  . Loss of hearing   . Lumbar back pain   . Measles as a child  . Medicare annual wellness visit, subsequent 11/24/2014   Sees Dr Delman Cheadle for dermatology Sees Dr Roni Bread of Urology Sees Dr Stanford Breed of cardiology Sees Dr Virginia Rochester of Opthamology No further colonoscopies warranted       . Mumps as a child  .  Nephrolithiasis   . Pain in joint, lower leg 07/29/2014  . PERIPHERAL VASCULAR DISEASE 11/16/2007   Qualifier: Diagnosis of  By: Julien Girt CMA, Marliss Czar    . Peripheral vascular disease ([place])   . Rectal bleeding 03/07/2017  . Shingles 07/29/2014  . Sun-damaged skin 05/31/2014    Past Surgical History:  Procedure Laterality Date  . CORONARY ANGIOPLASTY WITH STENT PLACEMENT  04/18/2013   RCA       . CORONARY ARTERY BYPASS GRAFT  1979   x4 SVG-DIAG-LAD, SVG-OM-PDA  . CORONARY ARTERY BYPASS GRAFT  1993   Redo x5 by Dr Harlow Asa; [facility], SVG-OM, SVG-AM-PL  . Decompressive laminectomy  01/2006   L2 - scarum by Dr. Shellia Carwin  . HEMORRHOID SURGERY     fissure with hemorrhoid corrected at age 23  . INGUINAL HERNIA REPAIR   1994   Right by Dr Harlow Asa  . INGUINAL HERNIA REPAIR  1996   Left by Dr. Harlow Asa  . LEFT HEART CATHETERIZATION WITH CORONARY ANGIOGRAM N/A 09/23/2013   Procedure: LEFT HEART CATHETERIZATION WITH CORONARY ANGIOGRAM;  Surgeon: Blane Ohara, MD;  Location: [facility] CATH LAB;  Service: Cardiovascular;  Laterality: N/A;  . lens implants     for vision correction  . PERCUTANEOUS CORONARY STENT INTERVENTION (PCI-S) N/A 04/18/2013   Procedure: PERCUTANEOUS CORONARY STENT INTERVENTION (PCI-S);  Surgeon: Sherren Mocha, MD;  Location: [facility] CATH LAB;  Service: Cardiovascular;  Laterality: N/A;  . TONSILLECTOMY      Family History  Problem Relation Age of Onset  . Parkinsonism Brother   . Diabetes Maternal Grandmother   . Depression Daughter   . Other Son        4 stents  . Heart disease Son   . Diabetes Son        type 2  . Colon cancer Neg Hx   . Esophageal cancer Neg Hx   . Rectal cancer Neg Hx   . Stomach cancer Neg Hx     Social History   Socioeconomic History  . Marital status: Widowed    Spouse name: [name] x 72 years  . Number of children: 7  . Years of education: Not on file  . Highest education level: Not on file  Occupational History  . 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 Smoking status: Former Smoker    Quit date: 11/28/1944    Years since quitting: 75.4  . Smokeless tobacco: Never Used  Substance and Sexual Activity  . Alcohol use: Yes    Alcohol/week: 2.0 standard drinks    Types: 2 Standard drinks or equivalent per week    Comment: daily rum  or wine  . Drug use: No  . Sexual activity: Not Currently    Comment: lives with wife, no dietary restrictions.   Other Topics Concern  . Not on file  Social History Narrative   Married   7 children   Social Determinants of Health   Financial Resource Strain: Low Risk   . Difficulty of Paying Living Expenses: Not hard at all  Food Insecurity: No Food Insecurity  . Worried About Paediatric nurse in the Last Year: Never true  . Ran Out of Food in the Last Year: Never true  Transportation Needs: No Transportation Needs  . Lack of Transportation (Medical): No  . Lack of Transportation (Non-Medical): No  Physical Activity:   . Days of Exercise per Week:   . Minutes of Exercise per Session:  Stress:   . Feeling of Stress :   Social Connections:   . Frequency of Communication with Friends and Family:   . Frequency of Social Gatherings with Friends and Family:   . Attends Religious Services:   . Active Member of Clubs or Organizations:   . Attends Archivist Meetings:   [name] Marital Status:   Intimate Partner Violence:   . Fear of Current or Ex-Partner:   . Emotionally Abused:   [name] Physically Abused:   . Sexually Abused:     Outpatient Medications Prior to Visit  Medication Sig Dispense Refill  . allopurinol (ZYLOPRIM) 300 MG tablet Take 1 tablet by mouth once daily 90 tablet 1  . AMBULATORY NON FORMULARY MEDICATION Place 30 g rectally 2 (two) times daily. Medication Name: Nitroglycerin 0.125% gel. Apply pea size amount to the rectum two times a day for 6-8 weeks Avoid exercise within 30 minutes of application. 30 g 1  . aspirin EC 81 MG tablet Take 81 mg by mouth every morning.     . B Complex-C (B-COMPLEX WITH VITAMIN C) tablet Take 1 tablet by mouth daily.     . Cholecalciferol (VITAMIN D-3 PO) Take 5,000 Units by mouth daily with breakfast.     . famotidine (PEPCID) 20 MG tablet Take 1 tablet (20 mg total) by mouth 2 (two) times daily. 90 tablet 3  . finasteride (PROSCAR) 5 MG tablet Takes every third day    . folic acid (FOLVITE) A999333 MCG tablet Take 400 mcg by mouth 2 (two) times daily.     [name] HYDROcodone-acetaminophen (NORCO/VICODIN) 5-325 MG tablet Take 0.5 tablets by mouth at bedtime.    . isosorbide mononitrate (IMDUR) 30 MG 24 hr tablet Take 3 tablets by mouth once daily 270 tablet 0  . losartan (COZAAR) 50 MG tablet Take 1 tablet (50 mg total) by  mouth daily. 90 tablet 2  . metoprolol succinate (TOPROL-XL) 25 MG 24 hr tablet Take 1/2 (one-half) tablet by mouth once daily 45 tablet 0  . Misc Natural Products (OSTEO BI-FLEX ADV JOINT SHIELD) TABS Take 1 tablet by mouth 2 (two) times daily.     . nitroGLYCERIN (NITROSTAT) 0.4 MG SL tablet Place 1 tablet (0.4 mg total) under the tongue every 5 (five) minutes as needed. For chest pain. 25 tablet 6  . simvastatin (ZOCOR) 40 MG tablet TAKE 1 TABLET BY MOUTH ONCE DAILY IN THE EVENING 90 tablet 0  . methylPREDNISolone (MEDROL) 2 MG tablet Take as directed 21 tablet 0  . sulfamethoxazole-trimethoprim (BACTRIM DS) 800-160 MG tablet Take 1 tablet by mouth 2 (two) times daily. 14 tablet 0  . traMADol (ULTRAM) 50 MG tablet Take 1 tablet (50 mg total) by mouth at bedtime. 15 tablet 0   No facility-administered medications prior to visit.    Allergies  Allergen Reactions  . Lisinopril     REACTION: dizziness  . Methocarbamol     REACTION: pt states "dizzy"  . Other   . Pregabalin     REACTION: pt states "dizzy"  . Ramipril     REACTION: hives and dizziness    Review of Systems  Constitutional: Negative for fever and malaise/fatigue.  HENT: Positive for congestion and hearing loss. Negative for ear discharge and ear pain.   Eyes: Negative for blurred vision.  Respiratory: Negative for shortness of breath.   Cardiovascular: Negative for chest pain, palpitations and leg swelling.  Gastrointestinal: Negative for abdominal pain, blood in stool and nausea.  Genitourinary: Negative  for dysuria and frequency.  Musculoskeletal: Negative for falls.  Skin: Negative for rash.  Neurological: Negative for dizziness, loss of consciousness and headaches.  Endo/Heme/Allergies: Negative for environmental allergies.  Psychiatric/Behavioral: Negative for depression. The patient is not nervous/anxious.        Objective:    Physical Exam Vitals and nursing note reviewed.  Constitutional:       General: He is not in acute distress.    Appearance: He is well-developed.  HENT:     Head: Normocephalic and atraumatic.     Nose: Nose normal.  Eyes:     General:        Right eye: No discharge.        Left eye: No discharge.  Cardiovascular:     Rate and Rhythm: Normal rate and regular rhythm.     Heart sounds: No murmur.  Pulmonary:     Effort: Pulmonary effort is normal.     Breath sounds: Normal breath sounds.  Abdominal:     General: Bowel sounds are normal.     Palpations: Abdomen is soft.     Tenderness: There is no abdominal tenderness.  Musculoskeletal:     Cervical back: Normal range of motion and neck supple.  Skin:    General: Skin is warm and dry.  Neurological:     Mental Status: He is alert and oriented to person, place, and time.     BP 120/80 (BP Location: Right Arm, Cuff Size: Normal)   Pulse (!) 55   Temp 98.2 F (36.8 C) (Temporal)   Resp 12   Ht 6' (1.829 m)   Wt 155 lb 9.6 oz (70.6 kg)   SpO2 97%   BMI 21.10 kg/m  Wt Readings from Last 3 Encounters:  04/23/20 155 lb 9.6 oz (70.6 kg)  04/23/20 155 lb 9.6 oz (70.6 kg)  03/20/20 156 lb 12.8 oz (71.1 kg)    Diabetic Foot Exam - Simple   No data filed     Lab Results  Component Value Date   WBC 9.1 04/03/2020   HGB 13.2 04/03/2020   HCT 39.2 04/03/2020   PLT 240.0 04/03/2020   GLUCOSE 107 (H) 03/20/2020   CHOL 82 03/12/2020   TRIG 90.0 03/12/2020   HDL 30.50 (L) 03/12/2020   LDLCALC 33 03/12/2020   ALT 21 03/20/2020   AST 27 03/20/2020   NA 137 03/20/2020   K 4.4 03/20/2020   CL 105 03/20/2020   CREATININE 1.22 03/20/2020   BUN 47 (H) 03/20/2020   CO2 24 03/20/2020   TSH 2.21 03/12/2020   PSA 25.19 (H) 04/03/2020   INR 1.09 02/04/2018   HGBA1C 5.7 03/12/2020    Lab Results  Component Value Date   TSH 2.21 03/12/2020   Lab Results  Component Value Date   WBC 9.1 04/03/2020   HGB 13.2 04/03/2020   HCT 39.2 04/03/2020   MCV 99.9 04/03/2020   PLT 240.0 04/03/2020    Lab Results  Component Value Date   NA 137 03/20/2020   K 4.4 03/20/2020   CO2 24 03/20/2020   GLUCOSE 107 (H) 03/20/2020   BUN 47 (H) 03/20/2020   CREATININE 1.22 03/20/2020   BILITOT 0.8 03/20/2020   ALKPHOS 84 03/20/2020   AST 27 03/20/2020   ALT 21 03/20/2020   PROT 6.3 03/20/2020   ALBUMIN 4.0 03/20/2020   CALCIUM 9.6 03/20/2020   ANIONGAP 7 11/30/2019   GFR 55.32 (L) 03/20/2020   Lab Results  Component Value Date  CHOL 82 03/12/2020   Lab Results  Component Value Date   HDL 30.50 (L) 03/12/2020   Lab Results  Component Value Date   LDLCALC 33 03/12/2020   Lab Results  Component Value Date   TRIG 90.0 03/12/2020   Lab Results  Component Value Date   CHOLHDL 3 03/12/2020   Lab Results  Component Value Date   HGBA1C 5.7 03/12/2020       Assessment & Plan:   Problem List Items Addressed This Visit    HYPERCHOLESTEROLEMIA    Encouraged heart healthy diet, increase exercise, avoid trans fats, consider a krill oil cap daily. Tolerating Simvastatin      Essential hypertension    Well controlled, no changes to meds. Encouraged heart healthy diet such as the DASH diet and exercise as tolerated.       Right hip pain    Due to proximity to RLQ patient was seen by general surgery and no surgical intervention is warranted      Hyperglycemia    hgba1c acceptable, minimize simple carbs. Increase exercise as tolerated.      SOM (serous otitis media)    Encouraged daily Cetirizine and Flonase.report if no improvement for referral to ENT for further evaluation       Other Visit Diagnoses    Crackling sound in ear, bilateral       Relevant Medications   fluticasone (FLONASE) 50 MCG/ACT nasal spray   Dysfunction of both eustachian tubes       Relevant Medications   fluticasone (FLONASE) 50 MCG/ACT nasal spray      I have discontinued Daniel Reeves. Daniel "Dick"'s methylPREDNISolone, traMADol, and sulfamethoxazole-trimethoprim. I have also changed his  fluticasone. Additionally, I am having him start on cetirizine. Lastly, I am having him maintain his folic acid, Osteo Bi-Flex Adv Joint Shield, Cholecalciferol (VITAMIN D-3 PO), aspirin EC, finasteride, nitroGLYCERIN, B-complex with vitamin C, famotidine, metoprolol succinate, AMBULATORY NON FORMULARY MEDICATION, isosorbide mononitrate, losartan, allopurinol, simvastatin, and HYDROcodone-acetaminophen.  Meds ordered this encounter  Medications  . fluticasone (FLONASE) 50 MCG/ACT nasal spray    Sig: Place 2 sprays into both nostrils daily as needed for allergies or rhinitis.    Dispense:  16 g    Refill:  2  . cetirizine (ZYRTEC) 10 MG tablet    Sig: Take 1 tablet (10 mg total) by mouth daily as needed for allergies.    Dispense:  30 tablet    Refill:  2     Penni Homans, MD

## 2020-04-27 NOTE — Assessment & Plan Note (Signed)
Encouraged daily Cetirizine and Flonase.report if no improvement for referral to ENT for further evaluation

## 2020-04-27 NOTE — Assessment & Plan Note (Signed)
Due to proximity to RLQ patient was seen by general surgery and no surgical intervention is warranted

## 2020-04-27 NOTE — Assessment & Plan Note (Signed)
Well controlled, no changes to meds. Encouraged heart healthy diet such as the DASH diet and exercise as tolerated.  °

## 2020-04-27 NOTE — Assessment & Plan Note (Signed)
Encouraged heart healthy diet, increase exercise, avoid trans fats, consider a krill oil cap daily. Tolerating Simvastatin.  

## 2020-05-03 ENCOUNTER — Other Ambulatory Visit: Payer: Self-pay | Admitting: Family Medicine

## 2020-05-04 ENCOUNTER — Ambulatory Visit: Payer: Medicare Other | Attending: Physician Assistant

## 2020-05-04 ENCOUNTER — Other Ambulatory Visit: Payer: Self-pay | Admitting: Family Medicine

## 2020-05-04 DIAGNOSIS — R293 Abnormal posture: Secondary | ICD-10-CM | POA: Insufficient documentation

## 2020-05-04 DIAGNOSIS — M25551 Pain in right hip: Secondary | ICD-10-CM | POA: Insufficient documentation

## 2020-05-04 DIAGNOSIS — M25552 Pain in left hip: Secondary | ICD-10-CM | POA: Insufficient documentation

## 2020-05-04 DIAGNOSIS — M545 Low back pain: Secondary | ICD-10-CM | POA: Insufficient documentation

## 2020-05-04 DIAGNOSIS — R29898 Other symptoms and signs involving the musculoskeletal system: Secondary | ICD-10-CM | POA: Insufficient documentation

## 2020-05-05 ENCOUNTER — Other Ambulatory Visit: Payer: Self-pay

## 2020-05-05 ENCOUNTER — Ambulatory Visit: Payer: Medicare Other

## 2020-05-05 VITALS — BP 126/64 | HR 73

## 2020-05-05 DIAGNOSIS — M545 Low back pain, unspecified: Secondary | ICD-10-CM

## 2020-05-05 DIAGNOSIS — R29898 Other symptoms and signs involving the musculoskeletal system: Secondary | ICD-10-CM | POA: Diagnosis present

## 2020-05-05 DIAGNOSIS — M25551 Pain in right hip: Secondary | ICD-10-CM

## 2020-05-05 DIAGNOSIS — M25552 Pain in left hip: Secondary | ICD-10-CM | POA: Diagnosis not present

## 2020-05-05 DIAGNOSIS — R293 Abnormal posture: Secondary | ICD-10-CM | POA: Diagnosis present

## 2020-05-05 NOTE — Therapy (Signed)
[facility] [address]  [place], [place], [address] Phone: [phone]   Fax:  [phone]  Physical Therapy Treatment  Patient Details  Name: Daniel Reeves MRN: [id number] Date of Birth: April 30, 1924 Referring Provider (PT): Erskine Emery, PA-C   Encounter Date: 05/05/2020  PT End of Session - 05/05/20 1108    Visit Number  2    Number of Visits  13    Date for PT Re-Evaluation  06/03/20    Authorization Type  Medicare, AARP, BCBS    PT Start Time  1100    PT Stop Time  1142    PT Time Calculation (min)  42 min    Activity Tolerance  Patient tolerated treatment well;Patient limited by pain    Behavior During Therapy  [facility] for tasks assessed/performed       Past Medical History:  Diagnosis Date  . Abdominal aortic aneurysm ([place])    a. [place] (1/14):  3.3 x 3.4 cm => f/u 11/2013  . Amebic dysentery   . AMEBIC DYSENTERY 11/19/2007   Qualifier: History of  By: Lenna Gilford MD, Deborra Medina   . Anemia 03/07/2017  . ANXIETY 11/19/2007   Qualifier: Diagnosis of  By: Lenna Gilford MD, Deborra Medina   . Arthritis 03/07/2017  . Atherosclerosis of coronary artery bypass graft with unstable angina pectoris ([place]) 04/19/2013  . BACK PAIN, LUMBAR 11/16/2007   Qualifier: Diagnosis of  By: Julien Girt CMA, Leigh    . Benign prostatic hypertrophy   . BENIGN PROSTATIC HYPERTROPHY, HX OF 11/16/2007   Qualifier: Diagnosis of  By: Julien Girt CMA, Leigh    . BRBPR (bright red blood per rectum) 11/01/2016  . CAD (coronary artery disease)    a. s/p CABG in 1979 and 1993;  b. LHC (5/14):  LM, LAD, CFX and RCA occluded; L-LAD ok, dLAD occluded after insertion of LIMA, S-OM occluded, S-PDA/AM 80-90 => PCI with Promus DES; EF 25%  . Cardiomyopathy, ischemic 06/05/2013  . Cerumen impaction    Bilateral  . Chicken pox as a child  . Chronic systolic CHF (congestive heart failure) ([place])   . COLONIC POLYPS 07/01/2008   Qualifier: Diagnosis of  By: Lenna Gilford MD, Deborra Medina   .  Degenerative joint disease   . DEGENERATIVE JOINT DISEASE 11/16/2007   Qualifier: Diagnosis of  By: Julien Girt CMA, Marliss Czar    . Diverticulosis of colon   . DIVERTICULOSIS OF COLON 07/01/2008   Qualifier: Diagnosis of  By: Lenna Gilford MD, Deborra Medina   . Double vision 03/07/2017  . Essential hypertension 11/16/2007   Qualifier: Diagnosis of  By: Julien Girt CMA, Marliss Czar    . Fingernail abnormalities 03/07/2017  . FLANK PAIN, RIGHT 02/03/2010   Qualifier: History of  By: Lenna Gilford MD, Deborra Medina   . GERD (gastroesophageal reflux disease)   . GOUT 11/16/2007   Qualifier: Diagnosis of  By: Julien Girt CMA, Marliss Czar    . Hearing loss 11/24/2014  . Heart murmur   . History of shingles 10/27/2017  . Hypercholesterolemia   . HYPERCHOLESTEROLEMIA 11/16/2007   Qualifier: Diagnosis of  By: Julien Girt CMA, Marliss Czar    . Ischemic cardiomyopathy    a. echo (09/05/13): EF 35%, diffuse HK worsened distal septal, mid/distal inferior and apical region, grade 1 diastolic dysfunction, mild LAE.    [name] Kidney stone 08/17/2011  . Loss of hearing   . Lumbar back pain   . Measles as a child  . Medicare annual wellness visit, subsequent 11/24/2014   Sees  Dr Delman Cheadle for dermatology Sees Dr Roni Bread of Urology Sees Dr Stanford Breed of cardiology Sees Dr Virginia Rochester of Opthamology No further colonoscopies warranted       . Mumps as a child  . Nephrolithiasis   . Pain in joint, lower leg 07/29/2014  . PERIPHERAL VASCULAR DISEASE 11/16/2007   Qualifier: Diagnosis of  By: Julien Girt CMA, Marliss Czar    . Peripheral vascular disease ([place])   . Rectal bleeding 03/07/2017  . Shingles 07/29/2014  . Sun-damaged skin 05/31/2014    Past Surgical History:  Procedure Laterality Date  . CORONARY ANGIOPLASTY WITH STENT PLACEMENT  04/18/2013   RCA       . CORONARY ARTERY BYPASS GRAFT  1979   x4 SVG-DIAG-LAD, SVG-OM-PDA  . CORONARY ARTERY BYPASS GRAFT  1993   Redo x5 by Dr Harlow Asa; [facility], SVG-OM, SVG-AM-PL  . Decompressive laminectomy  01/2006   L2 - scarum by Dr. Shellia Carwin  . HEMORRHOID  SURGERY     fissure with hemorrhoid corrected at age 86  . INGUINAL HERNIA REPAIR  1994   Right by Dr Harlow Asa  . INGUINAL HERNIA REPAIR  1996   Left by Dr. Harlow Asa  . LEFT HEART CATHETERIZATION WITH CORONARY ANGIOGRAM N/A 09/23/2013   Procedure: LEFT HEART CATHETERIZATION WITH CORONARY ANGIOGRAM;  Surgeon: Blane Ohara, MD;  Location: [facility] LAB;  Service: Cardiovascular;  Laterality: N/A;  . lens implants     for vision correction  . PERCUTANEOUS CORONARY STENT INTERVENTION (PCI-S) N/A 04/18/2013   Procedure: PERCUTANEOUS CORONARY STENT INTERVENTION (PCI-S);  Surgeon: Sherren Mocha, MD;  Location: [facility] CATH LAB;  Service: Cardiovascular;  Laterality: N/A;  . TONSILLECTOMY      Vitals:   05/05/20 1113  BP: 126/64  Pulse: 73  SpO2: 95%    Subjective Assessment - 05/05/20 1116    Subjective  Pt. doin ok today.    Pertinent History  PCD, loss of hearing, kidney stone, ischemic cardiomyopathy, HLD, GERD, HTN, chronic systolic CHF, CAD, CABG 0932, 1993, 2014, anxiety, anemia, AAA, L2-sacrum decompresison/laminectomy 2007    Diagnostic tests  lumbar xray 02/28/2020: no acute fractures.  Scoliosis with concavity to the left.  Prior laminectomies L2-S1.  No spondylolisthesis.    Patient Stated Goals  get rid of pain    Currently in Pain?  No/denies    Pain Score  0-No pain    Pain Location  Back    Pain Orientation  Left;Right;Lower         [facility] PT Assessment - 05/05/20 0001      AROM   Right/Left Hip  Left    Left Hip Extension  30   standing    Left Hip Flexion  40   sitting    Left Hip External Rotation   45   sitting    Left Hip Internal Rotation   20   sitting    Left Hip ABduction  44   standing                    [facility] Adult PT Treatment/Exercise - 05/05/20 0001      Self-Care   Self-Care  Other Self-Care Comments    Other Self-Care Comments   Discussed pt. pain pattern and rationale behind L LE stretching       Knee/Hip Exercises: Stretches    Piriformis Stretch  Left;2 reps;30 seconds    Piriformis Stretch Limitations  KTOS supine     Other Knee/Hip Stretches  L Figure-4 glute stretch 2 x 30  sec       Knee/Hip Exercises: Aerobic   Nustep  Lvl 2, 6 min (UE/LE)      Knee/Hip Exercises: Standing   Hip Extension  Right;Left;Stengthening;5 reps;Knee straight    Extension Limitations  45 dg kickback; chair support       Knee/Hip Exercises: Supine   Other Supine Knee/Hip Exercises  Hooklying L clam shell with yellow TB at knees x 10 reps       Knee/Hip Exercises: Sidelying   Clams  L clam shell x 10                PT Short Term Goals - 05/05/20 1108      PT SHORT TERM GOAL #1   Title  Patient to be independent with initial HEP.    Time  3    Period  Weeks    Status  On-going    Target Date  05/13/20        PT Long Term Goals - 05/05/20 1108      PT LONG TERM GOAL #1   Title  Patient to be independent with advanced HEP.    Time  6    Period  Weeks    Status  On-going      PT LONG TERM GOAL #2   Title  Patient to demonstrate lumbar/B hip AROM WFL and without pain limiting.    Time  6    Period  Weeks    Status  On-going      PT LONG TERM GOAL #3   Title  Patient to demonstrate B hip strength >/=4+/5.    Time  6    Period  Weeks    Status  On-going      PT LONG TERM GOAL #4   Title  Patient to demonstrate and recall proper body mechanics when lifting 20lbs from floor.    Time  6    Period  Weeks    Status  On-going      PT LONG TERM GOAL #5   Title  Patient to report tolerance for yard work activities with modifications as necessary.    Time  6    Period  Weeks    Status  On-going            Plan - 05/05/20 1109    Clinical Impression Statement  Barbarann Ehlers doing well today.  Pt. very attentive to proper technique and duration of stretching with HEP today.  Did require increased time between exercise due to pt. tendency to conversate.  Tolerated all LE stretching and proximal hip strengthening  well today.  Ended visit without complaint.    Comorbidities  PCD, loss of hearing, kidney stone, ischemic cardiomyopathy, HLD, GERD, HTN, chronic systolic CHF, CAD, CABG 3419, 1993, 2014, anxiety, anemia, AAA, L2-sacrum decompresison/laminectomy 2007    Rehab Potential  Good    PT Frequency  2x / week    PT Treatment/Interventions  ADLs/Self Care Home Management;Cryotherapy;Electrical Stimulation;Iontophoresis 4mg /ml Dexamethasone;Moist Heat;Balance training;Therapeutic exercise;Therapeutic activities;Functional mobility training;Stair training;Gait training;DME Instruction;Ultrasound;Neuromuscular re-education;Patient/family education;Manual techniques;Taping;Energy conservation;Dry needling;Passive range of motion;Canalith Repostioning    PT Next Visit Plan  reassess HEP; assess hip ROM and further assess hip strength; progress hip stretching and core strengthening    Consulted and Agree with Plan of Care  Patient       Patient will benefit from skilled therapeutic intervention in order to improve the following deficits and impairments:  Cardiopulmonary status limiting activity, Decreased endurance, Hypomobility, Decreased activity tolerance,  Decreased strength, Pain, Difficulty walking, Increased muscle spasms, Improper body mechanics, Decreased range of motion, Impaired flexibility, Postural dysfunction, Dizziness  Visit Diagnosis: Pain in left hip  Pain in right hip  Acute bilateral low back pain without sciatica  Other symptoms and signs involving the musculoskeletal system  Abnormal posture     Problem List Patient Active Problem List   Diagnosis Date Noted  . SOM (serous otitis media) 04/23/2020  . AKI (acute kidney injury) ([place]) 03/14/2020  . Exertional angina ([place]) 03/12/2020  . Grief reaction 12/24/2019  . Chest pain 11/30/2019  . Finger laceration 04/28/2019  . Leg swelling 03/09/2019  . Hyperglycemia 01/22/2019  . Otitis externa 01/22/2019  . Edema 12/17/2018  .  Right hip pain 12/17/2018  . Scrotal bleeding 03/01/2018  . Foot pain, right 03/01/2018  . Hemorrhoid 10/29/2017  . History of shingles 10/27/2017  . Anemia 03/07/2017  . Double vision 03/07/2017  . Arthritis 03/07/2017  . Medicare annual wellness visit, subsequent 11/24/2014  . Hearing loss 11/24/2014  . Pain in joint, lower leg 07/29/2014  . Sun-damaged skin 05/31/2014  . Chicken pox   . Measles   . Mumps   . Cardiomyopathy, ischemic 06/05/2013  . Atherosclerosis of coronary artery bypass graft with unstable angina pectoris ([place]) 04/19/2013  . Kidney stone 08/17/2011  . CAD (coronary artery disease)   . CERUMEN IMPACTION, BILATERAL 02/03/2010  . FLANK PAIN, RIGHT 02/03/2010  . Abdominal aortic aneurysm ([place]) 11/04/2009  . COLONIC POLYPS 07/01/2008  . Anxiety state 11/19/2007  . HYPERCHOLESTEROLEMIA 11/16/2007  . Gout 11/16/2007  . Essential hypertension 11/16/2007  . PERIPHERAL VASCULAR DISEASE 11/16/2007  . GERD 11/16/2007  . Osteoarthritis 11/16/2007  . BACK PAIN, LUMBAR 11/16/2007  . BPH (benign prostatic hyperplasia) 11/16/2007    Bess Harvest, PTA 05/05/20 11:52 AM   [facility] [address]  [place], [place], [address] Phone: [phone]   Fax:  [phone]  Name: Arul Farabee MRN: [id number] Date of Birth: Aug 17, 1924

## 2020-05-08 ENCOUNTER — Other Ambulatory Visit: Payer: Self-pay

## 2020-05-08 ENCOUNTER — Ambulatory Visit: Payer: Medicare Other

## 2020-05-08 DIAGNOSIS — M25552 Pain in left hip: Secondary | ICD-10-CM | POA: Diagnosis not present

## 2020-05-08 DIAGNOSIS — M25551 Pain in right hip: Secondary | ICD-10-CM

## 2020-05-08 DIAGNOSIS — R29898 Other symptoms and signs involving the musculoskeletal system: Secondary | ICD-10-CM

## 2020-05-08 DIAGNOSIS — R293 Abnormal posture: Secondary | ICD-10-CM

## 2020-05-08 DIAGNOSIS — M545 Low back pain, unspecified: Secondary | ICD-10-CM

## 2020-05-08 NOTE — Therapy (Signed)
[facility] [address]  [place], [place], [address] Phone: [phone]   Fax:  [phone]  Physical Therapy Treatment  Patient Details  Name: Daniel Reeves MRN: [id number] Date of Birth: Jan 27, 1924 Referring Provider (PT): Erskine Emery, PA-C   Encounter Date: 05/08/2020   PT End of Session - 05/08/20 1011    Visit Number 3    Number of Visits 13    Date for PT Re-Evaluation 06/03/20    Authorization Type Medicare, AARP, BCBS    PT Start Time 0930    PT Stop Time 1015    PT Time Calculation (min) 45 min    Activity Tolerance Patient tolerated treatment well    Behavior During Therapy [facility] for tasks assessed/performed           Past Medical History:  Diagnosis Date  . Abdominal aortic aneurysm ([place])    a. [place] (1/14):  3.3 x 3.4 cm => f/u 11/2013  . Amebic dysentery   . AMEBIC DYSENTERY 11/19/2007   Qualifier: History of  By: Lenna Gilford MD, Deborra Medina   . Anemia 03/07/2017  . ANXIETY 11/19/2007   Qualifier: Diagnosis of  By: Lenna Gilford MD, Deborra Medina   . Arthritis 03/07/2017  . Atherosclerosis of coronary artery bypass graft with unstable angina pectoris ([place]) 04/19/2013  . BACK PAIN, LUMBAR 11/16/2007   Qualifier: Diagnosis of  By: Julien Girt CMA, Leigh    . Benign prostatic hypertrophy   . BENIGN PROSTATIC HYPERTROPHY, HX OF 11/16/2007   Qualifier: Diagnosis of  By: Julien Girt CMA, Leigh    . BRBPR (bright red blood per rectum) 11/01/2016  . CAD (coronary artery disease)    a. s/p CABG in 1979 and 1993;  b. LHC (5/14):  LM, LAD, CFX and RCA occluded; L-LAD ok, dLAD occluded after insertion of LIMA, S-OM occluded, S-PDA/AM 80-90 => PCI with Promus DES; EF 25%  . Cardiomyopathy, ischemic 06/05/2013  . Cerumen impaction    Bilateral  . Chicken pox as a child  . Chronic systolic CHF (congestive heart failure) ([place])   . COLONIC POLYPS 07/01/2008   Qualifier: Diagnosis of  By: Lenna Gilford MD, Deborra Medina   . Degenerative joint disease     . DEGENERATIVE JOINT DISEASE 11/16/2007   Qualifier: Diagnosis of  By: Julien Girt CMA, Marliss Czar    . Diverticulosis of colon   . DIVERTICULOSIS OF COLON 07/01/2008   Qualifier: Diagnosis of  By: Lenna Gilford MD, Deborra Medina   . Double vision 03/07/2017  . Essential hypertension 11/16/2007   Qualifier: Diagnosis of  By: Julien Girt CMA, Marliss Czar    . Fingernail abnormalities 03/07/2017  . FLANK PAIN, RIGHT 02/03/2010   Qualifier: History of  By: Lenna Gilford MD, Deborra Medina   . GERD (gastroesophageal reflux disease)   . GOUT 11/16/2007   Qualifier: Diagnosis of  By: Julien Girt CMA, Marliss Czar    . Hearing loss 11/24/2014  . Heart murmur   . History of shingles 10/27/2017  . Hypercholesterolemia   . HYPERCHOLESTEROLEMIA 11/16/2007   Qualifier: Diagnosis of  By: Julien Girt CMA, Marliss Czar    . Ischemic cardiomyopathy    a. echo (09/05/13): EF 35%, diffuse HK worsened distal septal, mid/distal inferior and apical region, grade 1 diastolic dysfunction, mild LAE.    [name] Kidney stone 08/17/2011  . Loss of hearing   . Lumbar back pain   . Measles as a child  . Medicare annual wellness visit, subsequent 11/24/2014   Sees Dr Delman Cheadle for dermatology Sees Dr  Roni Bread of Urology Sees Dr Stanford Breed of cardiology Sees Dr Virginia Rochester of Opthamology No further colonoscopies warranted       . Mumps as a child  . Nephrolithiasis   . Pain in joint, lower leg 07/29/2014  . PERIPHERAL VASCULAR DISEASE 11/16/2007   Qualifier: Diagnosis of  By: Julien Girt CMA, Marliss Czar    . Peripheral vascular disease ([place])   . Rectal bleeding 03/07/2017  . Shingles 07/29/2014  . Sun-damaged skin 05/31/2014    Past Surgical History:  Procedure Laterality Date  . CORONARY ANGIOPLASTY WITH STENT PLACEMENT  04/18/2013   RCA       . CORONARY ARTERY BYPASS GRAFT  1979   x4 SVG-DIAG-LAD, SVG-OM-PDA  . CORONARY ARTERY BYPASS GRAFT  1993   Redo x5 by Dr Harlow Asa; [facility], SVG-OM, SVG-AM-PL  . Decompressive laminectomy  01/2006   L2 - scarum by Dr. Shellia Carwin  . HEMORRHOID SURGERY     fissure with  hemorrhoid corrected at age 68  . INGUINAL HERNIA REPAIR  1994   Right by Dr Harlow Asa  . INGUINAL HERNIA REPAIR  1996   Left by Dr. Harlow Asa  . LEFT HEART CATHETERIZATION WITH CORONARY ANGIOGRAM N/A 09/23/2013   Procedure: LEFT HEART CATHETERIZATION WITH CORONARY ANGIOGRAM;  Surgeon: Blane Ohara, MD;  Location: [facility] CATH LAB;  Service: Cardiovascular;  Laterality: N/A;  . lens implants     for vision correction  . PERCUTANEOUS CORONARY STENT INTERVENTION (PCI-S) N/A 04/18/2013   Procedure: PERCUTANEOUS CORONARY STENT INTERVENTION (PCI-S);  Surgeon: Sherren Mocha, MD;  Location: [facility] CATH LAB;  Service: Cardiovascular;  Laterality: N/A;  . TONSILLECTOMY      There were no vitals filed for this visit.   Subjective Assessment - 05/08/20 0934    Subjective Pt reports right hip was bothering him this morning. He exerted himself physically outside yesterday, which he think is the cause of the pain.    Pertinent History PCD, loss of hearing, kidney stone, ischemic cardiomyopathy, HLD, GERD, HTN, chronic systolic CHF, CAD, CABG 6720, 1993, 2014, anxiety, anemia, AAA, L2-sacrum decompresison/laminectomy 2007    Diagnostic tests lumbar xray 02/28/2020: no acute fractures.  Scoliosis with concavity to the left.  Prior laminectomies L2-S1.  No spondylolisthesis.    Patient Stated Goals get rid of pain    Currently in Pain? No/denies              [facility] PT Assessment - 05/08/20 0001      Strength   Overall Strength Comments Seated EOM    Strength Assessment Site Hip    Right/Left Hip Right;Left    Right Hip Flexion 4/5    Right Hip Extension 4-/5    Right Hip External Rotation  4/5    Right Hip Internal Rotation 4/5    Right Hip ABduction 4/5    Right Hip ADduction 4/5    Left Hip Flexion 3+/5    Left Hip Extension 3+/5    Left Hip External Rotation 3+/5    Left Hip Internal Rotation 3+/5    Left Hip ABduction 3+/5    Left Hip ADduction 3+/5                         [facility]  Adult PT Treatment/Exercise - 05/08/20 0001      Knee/Hip Exercises: Stretches   Passive Hamstring Stretch Both;2 reps;30 seconds      Knee/Hip Exercises: Supine   Bridges AROM;Strengthening;Both;1 set;10 reps    Bridges Limitations YTB around knees,  5" hold, abduction x 10 with bridge hold on last rep    Other Supine Knee/Hip Exercises Marching RTB x 20 alt      Knee/Hip Exercises: Sidelying   Clams B clams x 10 YTB above knees    Other Sidelying Knee/Hip Exercises Reverse Clams x 10 B    AAROM LLE for 1st 5 reps                   PT Short Term Goals - 05/05/20 1108      PT SHORT TERM GOAL #1   Title Patient to be independent with initial HEP.    Time 3    Period Weeks    Status On-going    Target Date 05/13/20             PT Long Term Goals - 05/05/20 1108      PT LONG TERM GOAL #1   Title Patient to be independent with advanced HEP.    Time 6    Period Weeks    Status On-going      PT LONG TERM GOAL #2   Title Patient to demonstrate lumbar/B hip AROM WFL and without pain limiting.    Time 6    Period Weeks    Status On-going      PT LONG TERM GOAL #3   Title Patient to demonstrate B hip strength >/=4+/5.    Time 6    Period Weeks    Status On-going      PT LONG TERM GOAL #4   Title Patient to demonstrate and recall proper body mechanics when lifting 20lbs from floor.    Time 6    Period Weeks    Status On-going      PT LONG TERM GOAL #5   Title Patient to report tolerance for yard work activities with modifications as necessary.    Time 6    Period Weeks    Status On-going                 Plan - 05/08/20 1012    Clinical Impression Statement Pt presents with report of R hip pain this morning, but none now in either hip. MMT of hips in sitting revealed weakness in L hip, also noting decrease in stance time on LLE during gait. He tolerated tx well with one instance of L HS cramping during bridge hold + hip ABD with YTB. Pt reported  "piece of cake" throughout, as well as "no pain no gain", expressing desire to be challenged. Did not assess vitals, as pt was not in any acute distress throughout session.    Comorbidities PCD, loss of hearing, kidney stone, ischemic cardiomyopathy, HLD, GERD, HTN, chronic systolic CHF, CAD, CABG 7494, 1993, 2014, anxiety, anemia, AAA, L2-sacrum decompresison/laminectomy 2007    Rehab Potential Good    PT Frequency 2x / week    PT Treatment/Interventions ADLs/Self Care Home Management;Cryotherapy;Electrical Stimulation;Iontophoresis 4mg /ml Dexamethasone;Moist Heat;Balance training;Therapeutic exercise;Therapeutic activities;Functional mobility training;Stair training;Gait training;DME Instruction;Ultrasound;Neuromuscular re-education;Patient/family education;Manual techniques;Taping;Energy conservation;Dry needling;Passive range of motion;Canalith Repostioning    PT Next Visit Plan progress hip strenghtening, stretching, and core strengthening    Consulted and Agree with Plan of Care Patient           Patient will benefit from skilled therapeutic intervention in order to improve the following deficits and impairments:  Cardiopulmonary status limiting activity, Decreased endurance, Hypomobility, Decreased activity tolerance, Decreased strength, Pain, Difficulty walking, Increased muscle spasms, Improper body mechanics, Decreased range of  motion, Impaired flexibility, Postural dysfunction, Dizziness  Visit Diagnosis: Pain in left hip  Abnormal posture  Pain in right hip  Acute bilateral low back pain without sciatica  Other symptoms and signs involving the musculoskeletal system     Problem List Patient Active Problem List   Diagnosis Date Noted  . SOM (serous otitis media) 04/23/2020  . AKI (acute kidney injury) ([place]) 03/14/2020  . Exertional angina ([place]) 03/12/2020  . Grief reaction 12/24/2019  . Chest pain 11/30/2019  . Finger laceration 04/28/2019  . Leg swelling 03/09/2019    . Hyperglycemia 01/22/2019  . Otitis externa 01/22/2019  . Edema 12/17/2018  . Right hip pain 12/17/2018  . Scrotal bleeding 03/01/2018  . Foot pain, right 03/01/2018  . Hemorrhoid 10/29/2017  . History of shingles 10/27/2017  . Anemia 03/07/2017  . Double vision 03/07/2017  . Arthritis 03/07/2017  . Medicare annual wellness visit, subsequent 11/24/2014  . Hearing loss 11/24/2014  . Pain in joint, lower leg 07/29/2014  . Sun-damaged skin 05/31/2014  . Chicken pox   . Measles   . Mumps   . Cardiomyopathy, ischemic 06/05/2013  . Atherosclerosis of coronary artery bypass graft with unstable angina pectoris ([place]) 04/19/2013  . Kidney stone 08/17/2011  . CAD (coronary artery disease)   . CERUMEN IMPACTION, BILATERAL 02/03/2010  . FLANK PAIN, RIGHT 02/03/2010  . Abdominal aortic aneurysm ([place]) 11/04/2009  . COLONIC POLYPS 07/01/2008  . Anxiety state 11/19/2007  . HYPERCHOLESTEROLEMIA 11/16/2007  . Gout 11/16/2007  . Essential hypertension 11/16/2007  . PERIPHERAL VASCULAR DISEASE 11/16/2007  . GERD 11/16/2007  . Osteoarthritis 11/16/2007  . BACK PAIN, LUMBAR 11/16/2007  . BPH (benign prostatic hyperplasia) 11/16/2007    Izell [place], PT, DPT 05/08/2020, 11:59 AM  [facility] [address]  [place], [place], [address] Phone: [phone]   Fax:  [phone]  Name: Daniel Reeves MRN: [id number] Date of Birth: 01-06-1924

## 2020-05-11 ENCOUNTER — Telehealth: Payer: Self-pay | Admitting: Family Medicine

## 2020-05-11 ENCOUNTER — Other Ambulatory Visit: Payer: Self-pay

## 2020-05-11 ENCOUNTER — Ambulatory Visit: Payer: Medicare Other

## 2020-05-11 DIAGNOSIS — Z9181 History of falling: Secondary | ICD-10-CM

## 2020-05-11 DIAGNOSIS — R5381 Other malaise: Secondary | ICD-10-CM

## 2020-05-11 DIAGNOSIS — M25552 Pain in left hip: Secondary | ICD-10-CM | POA: Diagnosis not present

## 2020-05-11 DIAGNOSIS — M25551 Pain in right hip: Secondary | ICD-10-CM

## 2020-05-11 DIAGNOSIS — R29898 Other symptoms and signs involving the musculoskeletal system: Secondary | ICD-10-CM

## 2020-05-11 DIAGNOSIS — R293 Abnormal posture: Secondary | ICD-10-CM

## 2020-05-11 DIAGNOSIS — M545 Low back pain, unspecified: Secondary | ICD-10-CM

## 2020-05-11 NOTE — Therapy (Signed)
[facility] [address]  [place], [place], [address] Phone: [phone]   Fax:  [phone]  Physical Therapy Treatment  Patient Details  Name: Daniel Reeves MRN: [id number] Date of Birth: 1924-08-03 Referring Provider (PT): Erskine Emery, PA-C   Encounter Date: 05/11/2020   PT End of Session - 05/11/20 1027    Visit Number 4    Number of Visits 13    Date for PT Re-Evaluation 06/03/20    Authorization Type Medicare, AARP, BCBS    PT Start Time 1018    PT Stop Time 1105    PT Time Calculation (min) 47 min    Activity Tolerance Patient tolerated treatment well    Behavior During Therapy [facility] for tasks assessed/performed           Past Medical History:  Diagnosis Date  . Abdominal aortic aneurysm ([place])    a. [place] (1/14):  3.3 x 3.4 cm => f/u 11/2013  . Amebic dysentery   . AMEBIC DYSENTERY 11/19/2007   Qualifier: History of  By: Lenna Gilford MD, Deborra Medina   . Anemia 03/07/2017  . ANXIETY 11/19/2007   Qualifier: Diagnosis of  By: Lenna Gilford MD, Deborra Medina   . Arthritis 03/07/2017  . Atherosclerosis of coronary artery bypass graft with unstable angina pectoris ([place]) 04/19/2013  . BACK PAIN, LUMBAR 11/16/2007   Qualifier: Diagnosis of  By: Julien Girt CMA, Leigh    . Benign prostatic hypertrophy   . BENIGN PROSTATIC HYPERTROPHY, HX OF 11/16/2007   Qualifier: Diagnosis of  By: Julien Girt CMA, Leigh    . BRBPR (bright red blood per rectum) 11/01/2016  . CAD (coronary artery disease)    a. s/p CABG in 1979 and 1993;  b. LHC (5/14):  LM, LAD, CFX and RCA occluded; L-LAD ok, dLAD occluded after insertion of LIMA, S-OM occluded, S-PDA/AM 80-90 => PCI with Promus DES; EF 25%  . Cardiomyopathy, ischemic 06/05/2013  . Cerumen impaction    Bilateral  . Chicken pox as a child  . Chronic systolic CHF (congestive heart failure) ([place])   . COLONIC POLYPS 07/01/2008   Qualifier: Diagnosis of  By: Lenna Gilford MD, Deborra Medina   . Degenerative joint disease     . DEGENERATIVE JOINT DISEASE 11/16/2007   Qualifier: Diagnosis of  By: Julien Girt CMA, Marliss Czar    . Diverticulosis of colon   . DIVERTICULOSIS OF COLON 07/01/2008   Qualifier: Diagnosis of  By: Lenna Gilford MD, Deborra Medina   . Double vision 03/07/2017  . Essential hypertension 11/16/2007   Qualifier: Diagnosis of  By: Julien Girt CMA, Marliss Czar    . Fingernail abnormalities 03/07/2017  . FLANK PAIN, RIGHT 02/03/2010   Qualifier: History of  By: Lenna Gilford MD, Deborra Medina   . GERD (gastroesophageal reflux disease)   . GOUT 11/16/2007   Qualifier: Diagnosis of  By: Julien Girt CMA, Marliss Czar    . Hearing loss 11/24/2014  . Heart murmur   . History of shingles 10/27/2017  . Hypercholesterolemia   . HYPERCHOLESTEROLEMIA 11/16/2007   Qualifier: Diagnosis of  By: Julien Girt CMA, Marliss Czar    . Ischemic cardiomyopathy    a. echo (09/05/13): EF 35%, diffuse HK worsened distal septal, mid/distal inferior and apical region, grade 1 diastolic dysfunction, mild LAE.    [name] Kidney stone 08/17/2011  . Loss of hearing   . Lumbar back pain   . Measles as a child  . Medicare annual wellness visit, subsequent 11/24/2014   Sees Dr Delman Cheadle for dermatology Sees Dr  Roni Bread of Urology Sees Dr Stanford Breed of cardiology Sees Dr Virginia Rochester of Opthamology No further colonoscopies warranted       . Mumps as a child  . Nephrolithiasis   . Pain in joint, lower leg 07/29/2014  . PERIPHERAL VASCULAR DISEASE 11/16/2007   Qualifier: Diagnosis of  By: Julien Girt CMA, Marliss Czar    . Peripheral vascular disease ([place])   . Rectal bleeding 03/07/2017  . Shingles 07/29/2014  . Sun-damaged skin 05/31/2014    Past Surgical History:  Procedure Laterality Date  . CORONARY ANGIOPLASTY WITH STENT PLACEMENT  04/18/2013   RCA       . CORONARY ARTERY BYPASS GRAFT  1979   x4 SVG-DIAG-LAD, SVG-OM-PDA  . CORONARY ARTERY BYPASS GRAFT  1993   Redo x5 by Dr Harlow Asa; [facility], SVG-OM, SVG-AM-PL  . Decompressive laminectomy  01/2006   L2 - scarum by Dr. Shellia Carwin  . HEMORRHOID SURGERY     fissure with  hemorrhoid corrected at age 51  . INGUINAL HERNIA REPAIR  1994   Right by Dr Harlow Asa  . INGUINAL HERNIA REPAIR  1996   Left by Dr. Harlow Asa  . LEFT HEART CATHETERIZATION WITH CORONARY ANGIOGRAM N/A 09/23/2013   Procedure: LEFT HEART CATHETERIZATION WITH CORONARY ANGIOGRAM;  Surgeon: Blane Ohara, MD;  Location[facility] C[facility] LAB;  Service: Cardiovascular;  Laterality: N/A;  . lens implants     for vision correction  . PERCUTANEOUS CORONARY STENT INTERVENTION (PCI-S) N/A 04/18/2013   Procedure: PERCUTANEOUS CORONARY STENT INTERVENTION (PCI-S);  Surgeon: Sherren Mocha, MD;  Location: [facility] CATH LAB;  Service: Cardiovascular;  Laterality: N/A;  . TONSILLECTOMY      There were no vitals filed for this visit.   Subjective Assessment - 05/11/20 1028    Subjective Has questions about frequency of HEP performance    Pertinent History PCD, loss of hearing, kidney stone, ischemic cardiomyopathy, HLD, GERD, HTN, chronic systolic CHF, CAD, CABG 8938, 1993, 2014, anxiety, anemia, AAA, L2-sacrum decompresison/laminectomy 2007    Diagnostic tests lumbar xray 02/28/2020: no acute fractures.  Scoliosis with concavity to the left.  Prior laminectomies L2-S1.  No spondylolisthesis.    Patient Stated Goals get rid of pain    Currently in Pain? No/denies    Pain Score 0-No pain                             [facility] Adult PT Treatment/Exercise - 05/11/20 0001      Neuro Re-ed    Neuro Re-ed Details  supervision: tandem walk forward/backwards at counter x 2 laps       Knee/Hip Exercises: Stretches   Piriformis Stretch Left;2 reps;30 seconds    Piriformis Stretch Limitations KTOS supine       Knee/Hip Exercises: Aerobic   Nustep Lvl 3, 6 min (UE/LE)      Knee/Hip Exercises: Standing   Heel Raises Both;15 reps    Heel Raises Limitations counter     Hip Abduction Right;Left;10 reps;Knee straight;Stengthening    Abduction Limitations cues for alignment       Knee/Hip Exercises: Seated   Other  Seated Knee/Hip Exercises Seated B hip IR with yellow TB at ankles+ adduction ball squeeze x 10     Sit to Sand 2 sets;5 reps   + isometric hip abd/ER into red TB at knees      Knee/Hip Exercises: Supine   Bridges with Clamshell Both;10 reps   + isometric hip abd/ER into red Tb at knees  Other Supine Knee/Hip Exercises L march into red looped TB at knees x 15 rpes     Other Supine Knee/Hip Exercises Hooklying L clam shell into red TB at knees x 10                     PT Short Term Goals - 05/11/20 1135      PT SHORT TERM GOAL #1   Title Patient to be independent with initial HEP.    Time 3    Period Weeks    Status Achieved    Target Date 05/13/20             PT Long Term Goals - 05/05/20 1108      PT LONG TERM GOAL #1   Title Patient to be independent with advanced HEP.    Time 6    Period Weeks    Status On-going      PT LONG TERM GOAL #2   Title Patient to demonstrate lumbar/B hip AROM WFL and without pain limiting.    Time 6    Period Weeks    Status On-going      PT LONG TERM GOAL #3   Title Patient to demonstrate B hip strength >/=4+/5.    Time 6    Period Weeks    Status On-going      PT LONG TERM GOAL #4   Title Patient to demonstrate and recall proper body mechanics when lifting 20lbs from floor.    Time 6    Period Weeks    Status On-going      PT LONG TERM GOAL #5   Title Patient to report tolerance for yard work activities with modifications as necessary.    Time 6    Period Weeks    Status On-going                 Plan - 05/11/20 1036    Clinical Impression Statement Daniel Reeves reports he is improving with therapy.  Does report that he has chosen not to perform HEP on days he is in therapy to conserve his energy.  Tolerated progression of focused L hip strengthening activities well today.  Pt. continues to be very attentive to instruction in session and very detail oriented with HEP.  Ended session verbalizing LE fatigue however  pain free.    Comorbidities PCD, loss of hearing, kidney stone, ischemic cardiomyopathy, HLD, GERD, HTN, chronic systolic CHF, CAD, CABG 5102, 1993, 2014, anxiety, anemia, AAA, L2-sacrum decompresison/laminectomy 2007    Rehab Potential Good    PT Treatment/Interventions ADLs/Self Care Home Management;Cryotherapy;Electrical Stimulation;Iontophoresis 4mg /ml Dexamethasone;Moist Heat;Balance training;Therapeutic exercise;Therapeutic activities;Functional mobility training;Stair training;Gait training;DME Instruction;Ultrasound;Neuromuscular re-education;Patient/family education;Manual techniques;Taping;Energy conservation;Dry needling;Passive range of motion;Canalith Repostioning    PT Next Visit Plan progress hip strenghtening, stretching, and core strengthening    Consulted and Agree with Plan of Care Patient           Patient will benefit from skilled therapeutic intervention in order to improve the following deficits and impairments:  Cardiopulmonary status limiting activity, Decreased endurance, Hypomobility, Decreased activity tolerance, Decreased strength, Pain, Difficulty walking, Increased muscle spasms, Improper body mechanics, Decreased range of motion, Impaired flexibility, Postural dysfunction, Dizziness  Visit Diagnosis: Pain in left hip  Abnormal posture  Pain in right hip  Acute bilateral low back pain without sciatica  Other symptoms and signs involving the musculoskeletal system     Problem List Patient Active Problem List   Diagnosis Date Noted  . SOM (  serous otitis media) 04/23/2020  . AKI (acute kidney injury) ([place]) 03/14/2020  . Exertional angina ([place]) 03/12/2020  . Grief reaction 12/24/2019  . Chest pain 11/30/2019  . Finger laceration 04/28/2019  . Leg swelling 03/09/2019  . Hyperglycemia 01/22/2019  . Otitis externa 01/22/2019  . Edema 12/17/2018  . Right hip pain 12/17/2018  . Scrotal bleeding 03/01/2018  . Foot pain, right 03/01/2018  . Hemorrhoid  10/29/2017  . History of shingles 10/27/2017  . Anemia 03/07/2017  . Double vision 03/07/2017  . Arthritis 03/07/2017  . Medicare annual wellness visit, subsequent 11/24/2014  . Hearing loss 11/24/2014  . Pain in joint, lower leg 07/29/2014  . Sun-damaged skin 05/31/2014  . Chicken pox   . Measles   . Mumps   . Cardiomyopathy, ischemic 06/05/2013  . Atherosclerosis of coronary artery bypass graft with unstable angina pectoris ([place]) 04/19/2013  . Kidney stone 08/17/2011  . CAD (coronary artery disease)   . CERUMEN IMPACTION, BILATERAL 02/03/2010  . FLANK PAIN, RIGHT 02/03/2010  . Abdominal aortic aneurysm ([place]) 11/04/2009  . COLONIC POLYPS 07/01/2008  . Anxiety state 11/19/2007  . HYPERCHOLESTEROLEMIA 11/16/2007  . Gout 11/16/2007  . Essential hypertension 11/16/2007  . PERIPHERAL VASCULAR DISEASE 11/16/2007  . GERD 11/16/2007  . Osteoarthritis 11/16/2007  . BACK PAIN, LUMBAR 11/16/2007  . BPH (benign prostatic hyperplasia) 11/16/2007    Bess Harvest, PTA 05/11/20 11:36 AM   [facility] [address]  [place], [place], [address] Phone: [phone]   Fax:  [phone]  Name: Daniel Reeves MRN: [id number] Date of Birth: 05-Jun-1924

## 2020-05-11 NOTE — Telephone Encounter (Signed)
Caller : Daniel Reeves  Call Back # [phone]  Patient is requesting a Civil engineer, contracting, patient states that he has reached out to [place] to a [place] @[phone], per [place], patient needs an approval form PCP so insurance will pay for chair.  Please Advise

## 2020-05-12 DIAGNOSIS — R972 Elevated prostate specific antigen [PSA]: Secondary | ICD-10-CM | POA: Diagnosis not present

## 2020-05-12 NOTE — Telephone Encounter (Signed)
Would use left hip pain, debility and high fall risk

## 2020-05-12 NOTE — Telephone Encounter (Signed)
Are you ok with writing?  If yes please let me know what diagnosis you would like to use and I will take care of the rx.

## 2020-05-13 NOTE — Telephone Encounter (Signed)
Rx faxed over to [facility] and lmom that we faxed rx.

## 2020-05-14 ENCOUNTER — Other Ambulatory Visit: Payer: Self-pay

## 2020-05-14 ENCOUNTER — Ambulatory Visit: Payer: Medicare Other | Admitting: Physical Therapy

## 2020-05-14 ENCOUNTER — Encounter: Payer: Self-pay | Admitting: Physical Therapy

## 2020-05-14 VITALS — BP 122/50 | HR 89

## 2020-05-14 DIAGNOSIS — R29898 Other symptoms and signs involving the musculoskeletal system: Secondary | ICD-10-CM

## 2020-05-14 DIAGNOSIS — M25552 Pain in left hip: Secondary | ICD-10-CM | POA: Diagnosis not present

## 2020-05-14 DIAGNOSIS — R293 Abnormal posture: Secondary | ICD-10-CM

## 2020-05-14 DIAGNOSIS — M25551 Pain in right hip: Secondary | ICD-10-CM

## 2020-05-14 DIAGNOSIS — M545 Low back pain, unspecified: Secondary | ICD-10-CM

## 2020-05-14 NOTE — Therapy (Signed)
[facility] [address]  [place], [place], [address] Phone: [phone]   Fax:  [phone]  Physical Therapy Treatment  Patient Details  Name: Daniel Reeves MRN: [id number] Date of Birth: [birth year] Referring Provider (PT): Erskine Emery, PA-C   Encounter Date: 05/14/2020   PT End of Session - 05/14/20 1115    Visit Number 5    Number of Visits 13    Date for PT Re-Evaluation 06/03/20    Authorization Type Medicare, AARP, BCBS    PT Start Time 0932    PT Stop Time 1016    PT Time Calculation (min) 44 min    Activity Tolerance Patient tolerated treatment well    Behavior During Therapy [facility] for tasks assessed/performed           Past Medical History:  Diagnosis Date  . Abdominal aortic aneurysm ([place])    a. [place] (1/14):  3.3 x 3.4 cm => f/u 11/2013  . Amebic dysentery   . AMEBIC DYSENTERY 11/19/2007   Qualifier: History of  By: Lenna Gilford MD, Deborra Medina   . Anemia 03/07/2017  . ANXIETY 11/19/2007   Qualifier: Diagnosis of  By: Lenna Gilford MD, Deborra Medina   . Arthritis 03/07/2017  . Atherosclerosis of coronary artery bypass graft with unstable angina pectoris ([place]) 04/19/2013  . BACK PAIN, LUMBAR 11/16/2007   Qualifier: Diagnosis of  By: Julien Girt CMA, Leigh    . Benign prostatic hypertrophy   . BENIGN PROSTATIC HYPERTROPHY, HX OF 11/16/2007   Qualifier: Diagnosis of  By: Julien Girt CMA, Leigh    . BRBPR (bright red blood per rectum) 11/01/2016  . CAD (coronary artery disease)    a. s/p CABG in 1979 and 1993;  b. LHC (5/14):  LM, LAD, CFX and RCA occluded; L-LAD ok, dLAD occluded after insertion of LIMA, S-OM occluded, S-PDA/AM 80-90 => PCI with Promus DES; EF 25%  . Cardiomyopathy, ischemic 06/05/2013  . Cerumen impaction    Bilateral  . Chicken pox as a child  . Chronic systolic CHF (congestive heart failure) ([place])   . COLONIC POLYPS 07/01/2008   Qualifier: Diagnosis of  By: Lenna Gilford MD, Deborra Medina   . Degenerative joint disease     . DEGENERATIVE JOINT DISEASE 11/16/2007   Qualifier: Diagnosis of  By: Julien Girt CMA, Marliss Czar    . Diverticulosis of colon   . DIVERTICULOSIS OF COLON 07/01/2008   Qualifier: Diagnosis of  By: Lenna Gilford MD, Deborra Medina   . Double vision 03/07/2017  . Essential hypertension 11/16/2007   Qualifier: Diagnosis of  By: Julien Girt CMA, Marliss Czar    . Fingernail abnormalities 03/07/2017  . FLANK PAIN, RIGHT 02/03/2010   Qualifier: History of  By: Lenna Gilford MD, Deborra Medina   . GERD (gastroesophageal reflux disease)   . GOUT 11/16/2007   Qualifier: Diagnosis of  By: Julien Girt CMA, Marliss Czar    . Hearing loss 11/24/2014  . Heart murmur   . History of shingles 10/27/2017  . Hypercholesterolemia   . HYPERCHOLESTEROLEMIA 11/16/2007   Qualifier: Diagnosis of  By: Julien Girt CMA, Marliss Czar    . Ischemic cardiomyopathy    a. echo (09/05/13): EF 35%, diffuse HK worsened distal septal, mid/distal inferior and apical region, grade 1 diastolic dysfunction, mild LAE.    [name] Kidney stone 08/17/2011  . Loss of hearing   . Lumbar back pain   . Measles as a child  . Medicare annual wellness visit, subsequent 11/24/2014   Sees Dr Delman Cheadle for dermatology Sees Dr  Roni Bread of Urology Sees Dr Stanford Breed of cardiology Sees Dr Virginia Rochester of Opthamology No further colonoscopies warranted       . Mumps as a child  . Nephrolithiasis   . Pain in joint, lower leg 07/29/2014  . PERIPHERAL VASCULAR DISEASE 11/16/2007   Qualifier: Diagnosis of  By: Julien Girt CMA, Marliss Czar    . Peripheral vascular disease ([place])   . Rectal bleeding 03/07/2017  . Shingles 07/29/2014  . Sun-damaged skin 05/31/2014    Past Surgical History:  Procedure Laterality Date  . CORONARY ANGIOPLASTY WITH STENT PLACEMENT  04/18/2013   RCA       . CORONARY ARTERY BYPASS GRAFT  1979   x4 SVG-DIAG-LAD, SVG-OM-PDA  . CORONARY ARTERY BYPASS GRAFT  1993   Redo x5 by Dr Harlow Asa; [facility], SVG-OM, SVG-AM-PL  . Decompressive laminectomy  01/2006   L2 - scarum by Dr. Shellia Carwin  . HEMORRHOID SURGERY     fissure with  hemorrhoid corrected at age 26  . INGUINAL HERNIA REPAIR  1994   Right by Dr Harlow Asa  . INGUINAL HERNIA REPAIR  1996   Left by Dr. Harlow Asa  . LEFT HEART CATHETERIZATION WITH CORONARY ANGIOGRAM N/A 09/23/2013   Procedure: LEFT HEART CATHETERIZATION WITH CORONARY ANGIOGRAM;  Surgeon: Blane Ohara, MD;  Location: [facility] CATH LAB;  Service: Cardiovascular;  Laterality: N/A;  . lens implants     for vision correction  . PERCUTANEOUS CORONARY STENT INTERVENTION (PCI-S) N/A 04/18/2013   Procedure: PERCUTANEOUS CORONARY STENT INTERVENTION (PCI-S);  Surgeon: Sherren Mocha, MD;  Location: [facility] CATH LAB;  Service: Cardiovascular;  Laterality: N/A;  . TONSILLECTOMY      Vitals:   05/14/20 0933 05/14/20 0940  BP: (!) 122/50   Pulse: 74 89  SpO2: 98% 97%     Subjective Assessment - 05/14/20 0935    Subjective Back is getting better. Had some pain this AM which was relieved with Tylenol. Concerned about his pain coming from his kidneys. Denies pain with urination or blood in urine.    Pertinent History PCD, loss of hearing, kidney stone, ischemic cardiomyopathy, HLD, GERD, HTN, chronic systolic CHF, CAD, CABG 7846, 1993, 2014, anxiety, anemia, AAA, L2-sacrum decompresison/laminectomy 2007    Diagnostic tests lumbar xray 02/28/2020: no acute fractures.  Scoliosis with concavity to the left.  Prior laminectomies L2-S1.  No spondylolisthesis.    Patient Stated Goals get rid of pain    Currently in Pain? No/denies                             [facility] Adult PT Treatment/Exercise - 05/14/20 0001      Exercises   Exercises Lumbar      Lumbar Exercises: Stretches   Passive Hamstring Stretch Left;2 reps;30 seconds;Right    Passive Hamstring Stretch Limitations supine with strap   review of proper form for decreased strain in LB   Lower Trunk Rotation Limitations x10 each way to tolerance      Lumbar Exercises: Supine   Bridge 20 reps    Bridge Limitations good ROM      Knee/Hip  Exercises: Aerobic   Nustep Lvl 4, 4 min (UE/LE)   requested to end early d/t fatigue     Knee/Hip Exercises: Standing   Hip Extension Stengthening;Right;Left;1 set;10 reps;Knee straight    Extension Limitations at counter   cues to avoid anterior lean   Other Standing Knee Exercises sidestepping along counter top 2x yellow TB around ankles, 2x around  toes   increased challenge around toes                 PT Education - 05/14/20 1114    Education Details update to HEP; thorough review of HEP with instruction on proper # of sets, reps, etc. of each exercises for improved understanding    Person(s) Educated Patient    Methods Explanation;Demonstration;Tactile cues;Verbal cues;Handout    Comprehension Verbalized understanding;Returned demonstration            PT Short Term Goals - 05/11/20 1135      PT SHORT TERM GOAL #1   Title Patient to be independent with initial HEP.    Time 3    Period Weeks    Status Achieved    Target Date 05/13/20             PT Long Term Goals - 05/05/20 1108      PT LONG TERM GOAL #1   Title Patient to be independent with advanced HEP.    Time 6    Period Weeks    Status On-going      PT LONG TERM GOAL #2   Title Patient to demonstrate lumbar/B hip AROM WFL and without pain limiting.    Time 6    Period Weeks    Status On-going      PT LONG TERM GOAL #3   Title Patient to demonstrate B hip strength >/=4+/5.    Time 6    Period Weeks    Status On-going      PT LONG TERM GOAL #4   Title Patient to demonstrate and recall proper body mechanics when lifting 20lbs from floor.    Time 6    Period Weeks    Status On-going      PT LONG TERM GOAL #5   Title Patient to report tolerance for yard work activities with modifications as necessary.    Time 6    Period Weeks    Status On-going                 Plan - 05/14/20 1115    Clinical Impression Statement Patient reporting that he is concerned about his LBP possibly  stemming from his kidneys. Not entirely sure if pain is associated with movement but denies pain with urination or blood in urine. Patient's report of pain in AM seems more consistent with arthritic type pain, but will continue to screen for red flags. Patient tolerated stretching with slight modification made to technique with HS stretch for improved tolerance. Patient performed bridges with good ROM and no c/o pain. Patient with tendency to lean trunk anteriorly with standing hip strengthening, with good effort to correct according to cues given. Able to perform resisted hip strengthening without c/o pain, thus HEP was updated with exercises that were well-tolerated today. Clarification on HEP provided and all questions answered. Patient denied pain and without complaints at end of session.    Comorbidities PCD, loss of hearing, kidney stone, ischemic cardiomyopathy, HLD, GERD, HTN, chronic systolic CHF, CAD, CABG 9678, 1993, 2014, anxiety, anemia, AAA, L2-sacrum decompresison/laminectomy 2007    Rehab Potential Good    PT Treatment/Interventions ADLs/Self Care Home Management;Cryotherapy;Electrical Stimulation;Iontophoresis 4mg /ml Dexamethasone;Moist Heat;Balance training;Therapeutic exercise;Therapeutic activities;Functional mobility training;Stair training;Gait training;DME Instruction;Ultrasound;Neuromuscular re-education;Patient/family education;Manual techniques;Taping;Energy conservation;Dry needling;Passive range of motion;Canalith Repostioning    PT Next Visit Plan progress hip strenghtening, stretching, and core strengthening    Consulted and Agree with Plan of Care Patient  Patient will benefit from skilled therapeutic intervention in order to improve the following deficits and impairments:  Cardiopulmonary status limiting activity, Decreased endurance, Hypomobility, Decreased activity tolerance, Decreased strength, Pain, Difficulty walking, Increased muscle spasms, Improper body  mechanics, Decreased range of motion, Impaired flexibility, Postural dysfunction, Dizziness  Visit Diagnosis: Pain in left hip  Pain in right hip  Acute bilateral low back pain without sciatica  Other symptoms and signs involving the musculoskeletal system  Abnormal posture     Problem List Patient Active Problem List   Diagnosis Date Noted  . SOM (serous otitis media) 04/23/2020  . AKI (acute kidney injury) ([place]) 03/14/2020  . Exertional angina ([place]) 03/12/2020  . Grief reaction 12/24/2019  . Chest pain 11/30/2019  . Finger laceration 04/28/2019  . Leg swelling 03/09/2019  . Hyperglycemia 01/22/2019  . Otitis externa 01/22/2019  . Edema 12/17/2018  . Right hip pain 12/17/2018  . Scrotal bleeding 03/01/2018  . Foot pain, right 03/01/2018  . Hemorrhoid 10/29/2017  . History of shingles 10/27/2017  . Anemia 03/07/2017  . Double vision 03/07/2017  . Arthritis 03/07/2017  . Medicare annual wellness visit, subsequent 11/24/2014  . Hearing loss 11/24/2014  . Pain in joint, lower leg 07/29/2014  . Sun-damaged skin 05/31/2014  . Chicken pox   . Measles   . Mumps   . Cardiomyopathy, ischemic 06/05/2013  . Atherosclerosis of coronary artery bypass graft with unstable angina pectoris ([place]) 04/19/2013  . Kidney stone 08/17/2011  . CAD (coronary artery disease)   . CERUMEN IMPACTION, BILATERAL 02/03/2010  . FLANK PAIN, RIGHT 02/03/2010  . Abdominal aortic aneurysm ([place]) 11/04/2009  . COLONIC POLYPS 07/01/2008  . Anxiety state 11/19/2007  . HYPERCHOLESTEROLEMIA 11/16/2007  . Gout 11/16/2007  . Essential hypertension 11/16/2007  . PERIPHERAL VASCULAR DISEASE 11/16/2007  . GERD 11/16/2007  . Osteoarthritis 11/16/2007  . BACK PAIN, LUMBAR 11/16/2007  . BPH (benign prostatic hyperplasia) 11/16/2007     Janene Harvey, PT, DPT 05/14/20 11:21 AM   [facility] [address]  [address] [place], [place],  [address] Phone: [phone]   Fax:  [phone]  Name: Daniel Reeves MRN: [id number] Date of Birth: Nov 14, 1924

## 2020-05-18 ENCOUNTER — Ambulatory Visit: Payer: Medicare Other | Admitting: Physical Therapy

## 2020-05-18 ENCOUNTER — Encounter: Payer: Self-pay | Admitting: Physical Therapy

## 2020-05-18 ENCOUNTER — Other Ambulatory Visit: Payer: Self-pay

## 2020-05-18 DIAGNOSIS — M25552 Pain in left hip: Secondary | ICD-10-CM | POA: Diagnosis not present

## 2020-05-18 DIAGNOSIS — M545 Low back pain, unspecified: Secondary | ICD-10-CM

## 2020-05-18 DIAGNOSIS — M25551 Pain in right hip: Secondary | ICD-10-CM

## 2020-05-18 DIAGNOSIS — R293 Abnormal posture: Secondary | ICD-10-CM

## 2020-05-18 DIAGNOSIS — R29898 Other symptoms and signs involving the musculoskeletal system: Secondary | ICD-10-CM

## 2020-05-18 NOTE — Therapy (Signed)
[facility] [address]  [place], [place], [address] Phone: [phone]   Fax:  [phone]  Physical Therapy Treatment  Patient Details  Name: Daniel Reeves MRN: [id number] Date of Birth: [birth year] Referring Provider (PT): Erskine Emery, PA-C   Encounter Date: 05/18/2020   PT End of Session - 05/18/20 1059    Visit Number 6    Number of Visits 13    Date for PT Re-Evaluation 06/03/20    Authorization Type Medicare, AARP, BCBS    PT Start Time 1010    PT Stop Time 1058    PT Time Calculation (min) 48 min    Activity Tolerance Patient tolerated treatment well    Behavior During Therapy [facility] for tasks assessed/performed           Past Medical History:  Diagnosis Date  . Abdominal aortic aneurysm ([place])    a. [place] (1/14):  3.3 x 3.4 cm => f/u 11/2013  . Amebic dysentery   . AMEBIC DYSENTERY 11/19/2007   Qualifier: History of  By: Lenna Gilford MD, Deborra Medina   . Anemia 03/07/2017  . ANXIETY 11/19/2007   Qualifier: Diagnosis of  By: Lenna Gilford MD, Deborra Medina   . Arthritis 03/07/2017  . Atherosclerosis of coronary artery bypass graft with unstable angina pectoris ([place]) 04/19/2013  . BACK PAIN, LUMBAR 11/16/2007   Qualifier: Diagnosis of  By: Julien Girt CMA, Leigh    . Benign prostatic hypertrophy   . BENIGN PROSTATIC HYPERTROPHY, HX OF 11/16/2007   Qualifier: Diagnosis of  By: Julien Girt CMA, Leigh    . BRBPR (bright red blood per rectum) 11/01/2016  . CAD (coronary artery disease)    a. s/p CABG in 1979 and 1993;  b. LHC (5/14):  LM, LAD, CFX and RCA occluded; L-LAD ok, dLAD occluded after insertion of LIMA, S-OM occluded, S-PDA/AM 80-90 => PCI with Promus DES; EF 25%  . Cardiomyopathy, ischemic 06/05/2013  . Cerumen impaction    Bilateral  . Chicken pox as a child  . Chronic systolic CHF (congestive heart failure) ([place])   . COLONIC POLYPS 07/01/2008   Qualifier: Diagnosis of  By: Lenna Gilford MD, Deborra Medina   . Degenerative joint disease     . DEGENERATIVE JOINT DISEASE 11/16/2007   Qualifier: Diagnosis of  By: Julien Girt CMA, Marliss Czar    . Diverticulosis of colon   . DIVERTICULOSIS OF COLON 07/01/2008   Qualifier: Diagnosis of  By: Lenna Gilford MD, Deborra Medina   . Double vision 03/07/2017  . Essential hypertension 11/16/2007   Qualifier: Diagnosis of  By: Julien Girt CMA, Marliss Czar    . Fingernail abnormalities 03/07/2017  . FLANK PAIN, RIGHT 02/03/2010   Qualifier: History of  By: Lenna Gilford MD, Deborra Medina   . GERD (gastroesophageal reflux disease)   . GOUT 11/16/2007   Qualifier: Diagnosis of  By: Julien Girt CMA, Marliss Czar    . Hearing loss 11/24/2014  . Heart murmur   . History of shingles 10/27/2017  . Hypercholesterolemia   . HYPERCHOLESTEROLEMIA 11/16/2007   Qualifier: Diagnosis of  By: Julien Girt CMA, Marliss Czar    . Ischemic cardiomyopathy    a. echo (09/05/13): EF 35%, diffuse HK worsened distal septal, mid/distal inferior and apical region, grade 1 diastolic dysfunction, mild LAE.    [name] Kidney stone 08/17/2011  . Loss of hearing   . Lumbar back pain   . Measles as a child  . Medicare annual wellness visit, subsequent 11/24/2014   Sees Dr Delman Cheadle for dermatology Sees Dr  Roni Bread of Urology Sees Dr Stanford Breed of cardiology Sees Dr Virginia Rochester of Opthamology No further colonoscopies warranted       . Mumps as a child  . Nephrolithiasis   . Pain in joint, lower leg 07/29/2014  . PERIPHERAL VASCULAR DISEASE 11/16/2007   Qualifier: Diagnosis of  By: Julien Girt CMA, Marliss Czar    . Peripheral vascular disease ([place])   . Rectal bleeding 03/07/2017  . Shingles 07/29/2014  . Sun-damaged skin 05/31/2014    Past Surgical History:  Procedure Laterality Date  . CORONARY ANGIOPLASTY WITH STENT PLACEMENT  04/18/2013   RCA       . CORONARY ARTERY BYPASS GRAFT  1979   x4 SVG-DIAG-LAD, SVG-OM-PDA  . CORONARY ARTERY BYPASS GRAFT  1993   Redo x5 by Dr Harlow Asa; [facility], SVG-OM, SVG-AM-PL  . Decompressive laminectomy  01/2006   L2 - scarum by Dr. Shellia Carwin  . HEMORRHOID SURGERY     fissure with  hemorrhoid corrected at age 86  . INGUINAL HERNIA REPAIR  1994   Right by Dr Harlow Asa  . INGUINAL HERNIA REPAIR  1996   Left by Dr. Harlow Asa  . LEFT HEART CATHETERIZATION WITH CORONARY ANGIOGRAM N/A 09/23/2013   Procedure: LEFT HEART CATHETERIZATION WITH CORONARY ANGIOGRAM;  Surgeon: Blane Ohara, MD;  Location[facility] CATH LAB;  Service: Cardiovascular;  Laterality: N/A;  . lens implants     for vision correction  . PERCUTANEOUS CORONARY STENT INTERVENTION (PCI-S) N/A 04/18/2013   Procedure: PERCUTANEOUS CORONARY STENT INTERVENTION (PCI-S);  Surgeon: Sherren Mocha, MD;  Location: [facility] CATH LAB;  Service: Cardiovascular;  Laterality: N/A;  . TONSILLECTOMY      There were no vitals filed for this visit.   Subjective Assessment - 05/18/20 1013    Subjective Having more pain today. Admits to being less active with HEP d/t exercises "not fitting into my schedule."    Pertinent History PCD, loss of hearing, kidney stone, ischemic cardiomyopathy, HLD, GERD, HTN, chronic systolic CHF, CAD, CABG 5625, 1993, 2014, anxiety, anemia, AAA, L2-sacrum decompresison/laminectomy 2007    Diagnostic tests lumbar xray 02/28/2020: no acute fractures.  Scoliosis with concavity to the left.  Prior laminectomies L2-S1.  No spondylolisthesis.    Patient Stated Goals get rid of pain    Currently in Pain? Yes    Pain Score 4     Pain Location Back    Pain Orientation Right;Left;Lower    Pain Descriptors / Indicators Aching    Pain Type Acute pain                             [facility] Adult PT Treatment/Exercise - 05/18/20 0001      Lumbar Exercises: Stretches   Lower Trunk Rotation Limitations x10 each way to tolerance      Lumbar Exercises: Supine   Ab Set 5 reps    AB Set Limitations 5x5" isometric B hip flexion wiht LEs resting on orange pball   cues to avoid Valsalva maneuver   Dead Bug 10 reps    Dead Bug Limitations cues for proper alignment and form    Bridge with clamshell 10 reps   red TB  above knees     Knee/Hip Exercises: Stretches   Hip Flexor Stretch Right;Left;1 rep;30 seconds    Hip Flexor Stretch Limitations at counter; in lunge position   report of more stretch sensation in gastroc vs. hip flexor      Knee/Hip Exercises: Aerobic   Nustep Lvl  3, 6 min (UE/LE)      Knee/Hip Exercises: Seated   Sit to Sand 2 sets;5 reps;without UE support   1st set sitting on 1 airex, 2nd set without                   PT Short Term Goals - 05/11/20 1135      PT SHORT TERM GOAL #1   Title Patient to be independent with initial HEP.    Time 3    Period Weeks    Status Achieved    Target Date 05/13/20             PT Long Term Goals - 05/05/20 1108      PT LONG TERM GOAL #1   Title Patient to be independent with advanced HEP.    Time 6    Period Weeks    Status On-going      PT LONG TERM GOAL #2   Title Patient to demonstrate lumbar/B hip AROM WFL and without pain limiting.    Time 6    Period Weeks    Status On-going      PT LONG TERM GOAL #3   Title Patient to demonstrate B hip strength >/=4+/5.    Time 6    Period Weeks    Status On-going      PT LONG TERM GOAL #4   Title Patient to demonstrate and recall proper body mechanics when lifting 20lbs from floor.    Time 6    Period Weeks    Status On-going      PT LONG TERM GOAL #5   Title Patient to report tolerance for yard work activities with modifications as necessary.    Time 6    Period Weeks    Status On-going                 Plan - 05/18/20 1059    Clinical Impression Statement Patient reporting increased LBP today and admitting to poor HEP compliance d/t HEP "not fitting into my schedule." Counseled patient on benefits of improved HEP compliance and flexibility with daily schedule. Patient reported understanding. Patient notes that he still continues to have pain in the AM which is well-managed by Tylenol. Notes that he has found modifications to dispose of grass cuttings when  moving law. Patient performed progressive hip and core strengthening ther-ex with good effort to correct form according to intermittent cues given. Patient with slight difficulty coordinating UEs/LEs with dead bug exercise. Able to perform bridges with banded resistance at hips with good ability to raise to neutral, but with evidence of B hip flexor tightness remaining. Worked on stretching this muscle group, however patient reporting more sensation of stretch in gastrocs vs. hip flexors. Will work on modified hip flexor stretching positions for improved benefit next session. Patient without complaints at end of session. Patient is progressing well and tolerating sessions without pain.    Comorbidities PCD, loss of hearing, kidney stone, ischemic cardiomyopathy, HLD, GERD, HTN, chronic systolic CHF, CAD, CABG 3662, 1993, 2014, anxiety, anemia, AAA, L2-sacrum decompresison/laminectomy 2007    Rehab Potential Good    PT Treatment/Interventions ADLs/Self Care Home Management;Cryotherapy;Electrical Stimulation;Iontophoresis 4mg /ml Dexamethasone;Moist Heat;Balance training;Therapeutic exercise;Therapeutic activities;Functional mobility training;Stair training;Gait training;DME Instruction;Ultrasound;Neuromuscular re-education;Patient/family education;Manual techniques;Taping;Energy conservation;Dry needling;Passive range of motion;Canalith Repostioning    PT Next Visit Plan progress hip strenghtening, stretching, and core strengthening    Consulted and Agree with Plan of Care Patient  Patient will benefit from skilled therapeutic intervention in order to improve the following deficits and impairments:  Cardiopulmonary status limiting activity, Decreased endurance, Hypomobility, Decreased activity tolerance, Decreased strength, Pain, Difficulty walking, Increased muscle spasms, Improper body mechanics, Decreased range of motion, Impaired flexibility, Postural dysfunction, Dizziness  Visit  Diagnosis: Pain in left hip  Pain in right hip  Acute bilateral low back pain without sciatica  Other symptoms and signs involving the musculoskeletal system  Abnormal posture     Problem List Patient Active Problem List   Diagnosis Date Noted  . SOM (serous otitis media) 04/23/2020  . AKI (acute kidney injury) ([place]) 03/14/2020  . Exertional angina ([place]) 03/12/2020  . Grief reaction 12/24/2019  . Chest pain 11/30/2019  . Finger laceration 04/28/2019  . Leg swelling 03/09/2019  . Hyperglycemia 01/22/2019  . Otitis externa 01/22/2019  . Edema 12/17/2018  . Right hip pain 12/17/2018  . Scrotal bleeding 03/01/2018  . Foot pain, right 03/01/2018  . Hemorrhoid 10/29/2017  . History of shingles 10/27/2017  . Anemia 03/07/2017  . Double vision 03/07/2017  . Arthritis 03/07/2017  . Medicare annual wellness visit, subsequent 11/24/2014  . Hearing loss 11/24/2014  . Pain in joint, lower leg 07/29/2014  . Sun-damaged skin 05/31/2014  . Chicken pox   . Measles   . Mumps   . Cardiomyopathy, ischemic 06/05/2013  . Atherosclerosis of coronary artery bypass graft with unstable angina pectoris ([place]) 04/19/2013  . Kidney stone 08/17/2011  . CAD (coronary artery disease)   . CERUMEN IMPACTION, BILATERAL 02/03/2010  . FLANK PAIN, RIGHT 02/03/2010  . Abdominal aortic aneurysm ([place]) 11/04/2009  . COLONIC POLYPS 07/01/2008  . Anxiety state 11/19/2007  . HYPERCHOLESTEROLEMIA 11/16/2007  . Gout 11/16/2007  . Essential hypertension 11/16/2007  . PERIPHERAL VASCULAR DISEASE 11/16/2007  . GERD 11/16/2007  . Osteoarthritis 11/16/2007  . BACK PAIN, LUMBAR 11/16/2007  . BPH (benign prostatic hyperplasia) 11/16/2007     Janene Harvey, PT, DPT 05/18/20 11:06 AM   [facility] [address]  [place], [place], [address] Phone: [phone]   Fax:  [phone]  Name: Sander Remedios MRN: [id number] Date of  Birth: [birth year]

## 2020-05-21 ENCOUNTER — Encounter: Payer: Self-pay | Admitting: Physical Therapy

## 2020-05-21 ENCOUNTER — Ambulatory Visit: Payer: Medicare Other | Admitting: Physical Therapy

## 2020-05-21 ENCOUNTER — Other Ambulatory Visit: Payer: Self-pay

## 2020-05-21 DIAGNOSIS — M25552 Pain in left hip: Secondary | ICD-10-CM

## 2020-05-21 DIAGNOSIS — R293 Abnormal posture: Secondary | ICD-10-CM

## 2020-05-21 DIAGNOSIS — M545 Low back pain, unspecified: Secondary | ICD-10-CM

## 2020-05-21 DIAGNOSIS — M25551 Pain in right hip: Secondary | ICD-10-CM

## 2020-05-21 DIAGNOSIS — R29898 Other symptoms and signs involving the musculoskeletal system: Secondary | ICD-10-CM

## 2020-05-21 NOTE — Therapy (Signed)
[facility] [address]  [place], [place], [address] Phone: [phone]   Fax:  [phone]  Physical Therapy Treatment  Patient Details  Name: Daniel Reeves MRN: [id number] Date of Birth: November 21, 1924 Referring Provider (PT): Erskine Emery, PA-C   Encounter Date: 05/21/2020   PT End of Session - 05/21/20 1105    Visit Number 7    Number of Visits 13    Date for PT Re-Evaluation 06/03/20    Authorization Type Medicare, AARP, BCBS    PT Start Time [phone]    PT Stop Time 1015    PT Time Calculation (min) 44 min    Activity Tolerance Patient tolerated treatment well    Behavior During Therapy [facility] for tasks assessed/performed           Past Medical History:  Diagnosis Date  . Abdominal aortic aneurysm ([place])    a. [place] (1/14):  3.3 x 3.4 cm => f/u 11/2013  . Amebic dysentery   . AMEBIC DYSENTERY 11/19/2007   Qualifier: History of  By: Lenna Gilford MD, Deborra Medina   . Anemia 03/07/2017  . ANXIETY 11/19/2007   Qualifier: Diagnosis of  By: Lenna Gilford MD, Deborra Medina   . Arthritis 03/07/2017  . Atherosclerosis of coronary artery bypass graft with unstable angina pectoris ([place]) 04/19/2013  . BACK PAIN, LUMBAR 11/16/2007   Qualifier: Diagnosis of  By: Julien Girt CMA, Leigh    . Benign prostatic hypertrophy   . BENIGN PROSTATIC HYPERTROPHY, HX OF 11/16/2007   Qualifier: Diagnosis of  By: Julien Girt CMA, Leigh    . BRBPR (bright red blood per rectum) 11/01/2016  . CAD (coronary artery disease)    a. s/p CABG in 1979 and 1993;  b. LHC (5/14):  LM, LAD, CFX and RCA occluded; L-LAD ok, dLAD occluded after insertion of LIMA, S-OM occluded, S-PDA/AM 80-90 => PCI with Promus DES; EF 25%  . Cardiomyopathy, ischemic 06/05/2013  . Cerumen impaction    Bilateral  . Chicken pox as a child  . Chronic systolic CHF (congestive heart failure) ([place])   . COLONIC POLYPS 07/01/2008   Qualifier: Diagnosis of  By: Lenna Gilford MD, Deborra Medina   . Degenerative joint disease     . DEGENERATIVE JOINT DISEASE 11/16/2007   Qualifier: Diagnosis of  By: Julien Girt CMA, Marliss Czar    . Diverticulosis of colon   . DIVERTICULOSIS OF COLON 07/01/2008   Qualifier: Diagnosis of  By: Lenna Gilford MD, Deborra Medina   . Double vision 03/07/2017  . Essential hypertension 11/16/2007   Qualifier: Diagnosis of  By: Julien Girt CMA, Marliss Czar    . Fingernail abnormalities 03/07/2017  . FLANK PAIN, RIGHT 02/03/2010   Qualifier: History of  By: Lenna Gilford MD, Deborra Medina   . GERD (gastroesophageal reflux disease)   . GOUT 11/16/2007   Qualifier: Diagnosis of  By: Julien Girt CMA, Marliss Czar    . Hearing loss 11/24/2014  . Heart murmur   . History of shingles 10/27/2017  . Hypercholesterolemia   . HYPERCHOLESTEROLEMIA 11/16/2007   Qualifier: Diagnosis of  By: Julien Girt CMA, Marliss Czar    . Ischemic cardiomyopathy    a. echo (09/05/13): EF 35%, diffuse HK worsened distal septal, mid/distal inferior and apical region, grade 1 diastolic dysfunction, mild LAE.    [name] Kidney stone 08/17/2011  . Loss of hearing   . Lumbar back pain   . Measles as a child  . Medicare annual wellness visit, subsequent 11/24/2014   Sees Dr Delman Cheadle for dermatology Sees Dr  Roni Bread of Urology Sees Dr Stanford Breed of cardiology Sees Dr Virginia Rochester of Opthamology No further colonoscopies warranted       . Mumps as a child  . Nephrolithiasis   . Pain in joint, lower leg 07/29/2014  . PERIPHERAL VASCULAR DISEASE 11/16/2007   Qualifier: Diagnosis of  By: Julien Girt CMA, Marliss Czar    . Peripheral vascular disease ([place])   . Rectal bleeding 03/07/2017  . Shingles 07/29/2014  . Sun-damaged skin 05/31/2014    Past Surgical History:  Procedure Laterality Date  . CORONARY ANGIOPLASTY WITH STENT PLACEMENT  04/18/2013   RCA       . CORONARY ARTERY BYPASS GRAFT  1979   x4 SVG-DIAG-LAD, SVG-OM-PDA  . CORONARY ARTERY BYPASS GRAFT  1993   Redo x5 by Dr Harlow Asa; [facility], SVG-OM, SVG-AM-PL  . Decompressive laminectomy  01/2006   L2 - scarum by Dr. Shellia Carwin  . HEMORRHOID SURGERY     fissure with  hemorrhoid corrected at age 51  . INGUINAL HERNIA REPAIR  1994   Right by Dr Harlow Asa  . INGUINAL HERNIA REPAIR  1996   Left by Dr. Harlow Asa  . LEFT HEART CATHETERIZATION WITH CORONARY ANGIOGRAM N/A 09/23/2013   Procedure: LEFT HEART CATHETERIZATION WITH CORONARY ANGIOGRAM;  Surgeon: Blane Ohara, MD;  Location: [facility] CATH LAB;  Service: Cardiovascular;  Laterality: N/A;  . lens implants     for vision correction  . PERCUTANEOUS CORONARY STENT INTERVENTION (PCI-S) N/A 04/18/2013   Procedure: PERCUTANEOUS CORONARY STENT INTERVENTION (PCI-S);  Surgeon: Sherren Mocha, MD;  Location: [facility] CATH LAB;  Service: Cardiovascular;  Laterality: N/A;  . TONSILLECTOMY      There were no vitals filed for this visit.   Subjective Assessment - 05/21/20 0932    Subjective Had some pain this AM which was eased with Tylenol. Had some pain after last session, which was relieved with HEP done at home later that night.    Pertinent History PCD, loss of hearing, kidney stone, ischemic cardiomyopathy, HLD, GERD, HTN, chronic systolic CHF, CAD, CABG 4098, 1993, 2014, anxiety, anemia, AAA, L2-sacrum decompresison/laminectomy 2007    Diagnostic tests lumbar xray 02/28/2020: no acute fractures.  Scoliosis with concavity to the left.  Prior laminectomies L2-S1.  No spondylolisthesis.    Patient Stated Goals get rid of pain    Currently in Pain? No/denies                             [facility] Adult PT Treatment/Exercise - 05/21/20 0001      Lumbar Exercises: Stretches   Other Lumbar Stretch Exercise R/L QL stretch 2x30" in doorway   with CGA     Lumbar Exercises: Aerobic   Recumbent Bike L1 x 29min       Lumbar Exercises: Standing   Row Strengthening;Both;10 reps;Theraband    Theraband Level (Row) Level 2 (Red);Level 3 (Green)    Row Limitations 2nd set with green TB; cues to maintain upright posture     Other Standing Lumbar Exercises R/L paloff press with red TB x10 each side   manual cues to maintain  upright posture     Lumbar Exercises: Sidelying   Clam Right;Left;10 reps    Clam Limitations 2nd set with yellow TB; cues to avoid posterior trunk rotation    Other Sidelying Lumbar Exercises R/L open book stretch x10 each side   good ROM  PT Short Term Goals - 05/11/20 1135      PT SHORT TERM GOAL #1   Title Patient to be independent with initial HEP.    Time 3    Period Weeks    Status Achieved    Target Date 05/13/20             PT Long Term Goals - 05/05/20 1108      PT LONG TERM GOAL #1   Title Patient to be independent with advanced HEP.    Time 6    Period Weeks    Status On-going      PT LONG TERM GOAL #2   Title Patient to demonstrate lumbar/B hip AROM WFL and without pain limiting.    Time 6    Period Weeks    Status On-going      PT LONG TERM GOAL #3   Title Patient to demonstrate B hip strength >/=4+/5.    Time 6    Period Weeks    Status On-going      PT LONG TERM GOAL #4   Title Patient to demonstrate and recall proper body mechanics when lifting 20lbs from floor.    Time 6    Period Weeks    Status On-going      PT LONG TERM GOAL #5   Title Patient to report tolerance for yard work activities with modifications as necessary.    Time 6    Period Weeks    Status On-going                 Plan - 05/21/20 1106    Clinical Impression Statement Patient reporting increased pain after last session, which was relieved from performing HEP at home later that night. Worked on gentle thoracolumbar mobility with patient demonstrating good ROM and tolerance. Proceeded with initiation of clamshells. Patient required cueing to avoid posterior trunk rotation, but able to perform with light banded resistance. Patient performed QL stretching with report of increased stretch sensation in R vs. L QL. Core strengthening with banded resistance was performed with intermittent cues to stand upright. Patient performed all ther-ex without  complaints and no pain at end of session.    Comorbidities PCD, loss of hearing, kidney stone, ischemic cardiomyopathy, HLD, GERD, HTN, chronic systolic CHF, CAD, CABG 7322, 1993, 2014, anxiety, anemia, AAA, L2-sacrum decompresison/laminectomy 2007    Rehab Potential Good    PT Treatment/Interventions ADLs/Self Care Home Management;Cryotherapy;Electrical Stimulation;Iontophoresis 4mg /ml Dexamethasone;Moist Heat;Balance training;Therapeutic exercise;Therapeutic activities;Functional mobility training;Stair training;Gait training;DME Instruction;Ultrasound;Neuromuscular re-education;Patient/family education;Manual techniques;Taping;Energy conservation;Dry needling;Passive range of motion;Canalith Repostioning    PT Next Visit Plan progress hip strenghtening, stretching, and core strengthening    Consulted and Agree with Plan of Care Patient           Patient will benefit from skilled therapeutic intervention in order to improve the following deficits and impairments:  Cardiopulmonary status limiting activity, Decreased endurance, Hypomobility, Decreased activity tolerance, Decreased strength, Pain, Difficulty walking, Increased muscle spasms, Improper body mechanics, Decreased range of motion, Impaired flexibility, Postural dysfunction, Dizziness  Visit Diagnosis: Pain in left hip  Pain in right hip  Acute bilateral low back pain without sciatica  Other symptoms and signs involving the musculoskeletal system  Abnormal posture     Problem List Patient Active Problem List   Diagnosis Date Noted  . SOM (serous otitis media) 04/23/2020  . AKI (acute kidney injury) ([place]) 03/14/2020  . Exertional angina ([place]) 03/12/2020  . Grief reaction 12/24/2019  . Chest pain  11/30/2019  . Finger laceration 04/28/2019  . Leg swelling 03/09/2019  . Hyperglycemia 01/22/2019  . Otitis externa 01/22/2019  . Edema 12/17/2018  . Right hip pain 12/17/2018  . Scrotal bleeding 03/01/2018  . Foot pain,  right 03/01/2018  . Hemorrhoid 10/29/2017  . History of shingles 10/27/2017  . Anemia 03/07/2017  . Double vision 03/07/2017  . Arthritis 03/07/2017  . Medicare annual wellness visit, subsequent 11/24/2014  . Hearing loss 11/24/2014  . Pain in joint, lower leg 07/29/2014  . Sun-damaged skin 05/31/2014  . Chicken pox   . Measles   . Mumps   . Cardiomyopathy, ischemic 06/05/2013  . Atherosclerosis of coronary artery bypass graft with unstable angina pectoris ([place]) 04/19/2013  . Kidney stone 08/17/2011  . CAD (coronary artery disease)   . CERUMEN IMPACTION, BILATERAL 02/03/2010  . FLANK PAIN, RIGHT 02/03/2010  . Abdominal aortic aneurysm ([place]) 11/04/2009  . COLONIC POLYPS 07/01/2008  . Anxiety state 11/19/2007  . HYPERCHOLESTEROLEMIA 11/16/2007  . Gout 11/16/2007  . Essential hypertension 11/16/2007  . PERIPHERAL VASCULAR DISEASE 11/16/2007  . GERD 11/16/2007  . Osteoarthritis 11/16/2007  . BACK PAIN, LUMBAR 11/16/2007  . BPH (benign prostatic hyperplasia) 11/16/2007     Janene Harvey, PT, DPT 05/21/20 11:07 AM   [facility] [address]  [address] [place], [place], [address] Phone: [phone]   Fax:  [phone]  Name: Daniel Reeves MRN: [id number] Date of Birth: 19-Jul-1924

## 2020-05-22 ENCOUNTER — Telehealth: Payer: Self-pay

## 2020-05-22 ENCOUNTER — Other Ambulatory Visit: Payer: Self-pay | Admitting: Cardiology

## 2020-05-22 NOTE — Telephone Encounter (Signed)
Patient called in to speak with the nurse about getting a shower chair. Please give the patient Daniel Reeves a call back at [phone]

## 2020-05-25 ENCOUNTER — Other Ambulatory Visit: Payer: Self-pay

## 2020-05-25 ENCOUNTER — Encounter: Payer: Self-pay | Admitting: Physical Therapy

## 2020-05-25 ENCOUNTER — Ambulatory Visit: Payer: Medicare Other | Admitting: Physical Therapy

## 2020-05-25 DIAGNOSIS — M25552 Pain in left hip: Secondary | ICD-10-CM

## 2020-05-25 DIAGNOSIS — R293 Abnormal posture: Secondary | ICD-10-CM

## 2020-05-25 DIAGNOSIS — R29898 Other symptoms and signs involving the musculoskeletal system: Secondary | ICD-10-CM

## 2020-05-25 DIAGNOSIS — M545 Low back pain, unspecified: Secondary | ICD-10-CM

## 2020-05-25 DIAGNOSIS — M25551 Pain in right hip: Secondary | ICD-10-CM

## 2020-05-25 NOTE — Therapy (Signed)
[facility] [address]  [place], [place], [address] Phone: [phone]   Fax:  [phone]  Physical Therapy Treatment  Patient Details  Name: Daniel Reeves MRN: [id number] Date of Birth: Jan 03, 1924 Referring Provider (PT): Erskine Emery, PA-C   Encounter Date: 05/25/2020   PT End of Session - 05/25/20 1100    Visit Number 8    Number of Visits 13    Date for PT Re-Evaluation 06/03/20    Authorization Type Medicare, AARP, BCBS    PT Start Time 1015    PT Stop Time 1058    PT Time Calculation (min) 43 min    Activity Tolerance Patient tolerated treatment well    Behavior During Therapy [facility] for tasks assessed/performed           Past Medical History:  Diagnosis Date  . Abdominal aortic aneurysm ([place])    a. [place] (1/14):  3.3 x 3.4 cm => f/u 11/2013  . Amebic dysentery   . AMEBIC DYSENTERY 11/19/2007   Qualifier: History of  By: Lenna Gilford MD, Deborra Medina   . Anemia 03/07/2017  . ANXIETY 11/19/2007   Qualifier: Diagnosis of  By: Lenna Gilford MD, Deborra Medina   . Arthritis 03/07/2017  . Atherosclerosis of coronary artery bypass graft with unstable angina pectoris ([place]) 04/19/2013  . BACK PAIN, LUMBAR 11/16/2007   Qualifier: Diagnosis of  By: Julien Girt CMA, Leigh    . Benign prostatic hypertrophy   . BENIGN PROSTATIC HYPERTROPHY, HX OF 11/16/2007   Qualifier: Diagnosis of  By: Julien Girt CMA, Leigh    . BRBPR (bright red blood per rectum) 11/01/2016  . CAD (coronary artery disease)    a. s/p CABG in 1979 and 1993;  b. LHC (5/14):  LM, LAD, CFX and RCA occluded; L-LAD ok, dLAD occluded after insertion of LIMA, S-OM occluded, S-PDA/AM 80-90 => PCI with Promus DES; EF 25%  . Cardiomyopathy, ischemic 06/05/2013  . Cerumen impaction    Bilateral  . Chicken pox as a child  . Chronic systolic CHF (congestive heart failure) ([place])   . COLONIC POLYPS 07/01/2008   Qualifier: Diagnosis of  By: Lenna Gilford MD, Deborra Medina   . Degenerative joint disease     . DEGENERATIVE JOINT DISEASE 11/16/2007   Qualifier: Diagnosis of  By: Julien Girt CMA, Marliss Czar    . Diverticulosis of colon   . DIVERTICULOSIS OF COLON 07/01/2008   Qualifier: Diagnosis of  By: Lenna Gilford MD, Deborra Medina   . Double vision 03/07/2017  . Essential hypertension 11/16/2007   Qualifier: Diagnosis of  By: Julien Girt CMA, Marliss Czar    . Fingernail abnormalities 03/07/2017  . FLANK PAIN, RIGHT 02/03/2010   Qualifier: History of  By: Lenna Gilford MD, Deborra Medina   . GERD (gastroesophageal reflux disease)   . GOUT 11/16/2007   Qualifier: Diagnosis of  By: Julien Girt CMA, Marliss Czar    . Hearing loss 11/24/2014  . Heart murmur   . History of shingles 10/27/2017  . Hypercholesterolemia   . HYPERCHOLESTEROLEMIA 11/16/2007   Qualifier: Diagnosis of  By: Julien Girt CMA, Marliss Czar    . Ischemic cardiomyopathy    a. echo (09/05/13): EF 35%, diffuse HK worsened distal septal, mid/distal inferior and apical region, grade 1 diastolic dysfunction, mild LAE.    [name] Kidney stone 08/17/2011  . Loss of hearing   . Lumbar back pain   . Measles as a child  . Medicare annual wellness visit, subsequent 11/24/2014   Sees Dr Delman Cheadle for dermatology Sees Dr  Roni Bread of Urology Sees Dr Stanford Breed of cardiology Sees Dr Virginia Rochester of Opthamology No further colonoscopies warranted       . Mumps as a child  . Nephrolithiasis   . Pain in joint, lower leg 07/29/2014  . PERIPHERAL VASCULAR DISEASE 11/16/2007   Qualifier: Diagnosis of  By: Julien Girt CMA, Marliss Czar    . Peripheral vascular disease ([place])   . Rectal bleeding 03/07/2017  . Shingles 07/29/2014  . Sun-damaged skin 05/31/2014    Past Surgical History:  Procedure Laterality Date  . CORONARY ANGIOPLASTY WITH STENT PLACEMENT  04/18/2013   RCA       . CORONARY ARTERY BYPASS GRAFT  1979   x4 SVG-DIAG-LAD, SVG-OM-PDA  . CORONARY ARTERY BYPASS GRAFT  1993   Redo x5 by Dr Harlow Asa; [facility], SVG-OM, SVG-AM-PL  . Decompressive laminectomy  01/2006   L2 - scarum by Dr. Shellia Carwin  . HEMORRHOID SURGERY     fissure with  hemorrhoid corrected at age 13  . INGUINAL HERNIA REPAIR  1994   Right by Dr Harlow Asa  . INGUINAL HERNIA REPAIR  1996   Left by Dr. Harlow Asa  . LEFT HEART CATHETERIZATION WITH CORONARY ANGIOGRAM N/A 09/23/2013   Procedure: LEFT HEART CATHETERIZATION WITH CORONARY ANGIOGRAM;  Surgeon: Blane Ohara, MD;  Location: [facility] CATH LAB;  Service: Cardiovascular;  Laterality: N/A;  . lens implants     for vision correction  . PERCUTANEOUS CORONARY STENT INTERVENTION (PCI-S) N/A 04/18/2013   Procedure: PERCUTANEOUS CORONARY STENT INTERVENTION (PCI-S);  Surgeon: Sherren Mocha, MD;  Location: [facility] CATH LAB;  Service: Cardiovascular;  Laterality: N/A;  . TONSILLECTOMY      There were no vitals filed for this visit.   Subjective Assessment - 05/25/20 1016    Subjective Had a big twinge in his back this AM but it was resolved with Tylenol. Worked on fixing his house up for a new owner over the weekend, and finds that he has a lot of other things competing for his time besides his exercises.    Pertinent History PCD, loss of hearing, kidney stone, ischemic cardiomyopathy, HLD, GERD, HTN, chronic systolic CHF, CAD, CABG 6045, 1993, 2014, anxiety, anemia, AAA, L2-sacrum decompresison/laminectomy 2007    Diagnostic tests lumbar xray 02/28/2020: no acute fractures.  Scoliosis with concavity to the left.  Prior laminectomies L2-S1.  No spondylolisthesis.    Patient Stated Goals get rid of pain    Currently in Pain? No/denies                             [facility] Adult PT Treatment/Exercise - 05/25/20 0001      Lumbar Exercises: Sidelying   Other Sidelying Lumbar Exercises R/L open book stretch x10 each side      Knee/Hip Exercises: Stretches   Passive Hamstring Stretch Right;Left;1 rep;30 seconds    Passive Hamstring Stretch Limitations supine with strap      Knee/Hip Exercises: Aerobic   Nustep Lvl 4, 6 min (UE/LE)      Knee/Hip Exercises: Standing   Wall Squat 1 set;10 reps    Wall Squat  Limitations cues to shift L    Other Standing Knee Exercises resisted trunk rotation x7 each side  with yellow TB   attempted with red TB but reported difficulty                 PT Education - 05/25/20 1058    Education Details edu on energy conservation techniques and dividing  weeding tasks into smaller sections to avoid exacerbation of back pain; advised patient to F/U with referring PA after completion of therapy POC    Person(s) Educated Patient    Methods Explanation;Demonstration    Comprehension Verbalized understanding            PT Short Term Goals - 05/11/20 1135      PT SHORT TERM GOAL #1   Title Patient to be independent with initial HEP.    Time 3    Period Weeks    Status Achieved    Target Date 05/13/20             PT Long Term Goals - 05/05/20 1108      PT LONG TERM GOAL #1   Title Patient to be independent with advanced HEP.    Time 6    Period Weeks    Status On-going      PT LONG TERM GOAL #2   Title Patient to demonstrate lumbar/B hip AROM WFL and without pain limiting.    Time 6    Period Weeks    Status On-going      PT LONG TERM GOAL #3   Title Patient to demonstrate B hip strength >/=4+/5.    Time 6    Period Weeks    Status On-going      PT LONG TERM GOAL #4   Title Patient to demonstrate and recall proper body mechanics when lifting 20lbs from floor.    Time 6    Period Weeks    Status On-going      PT LONG TERM GOAL #5   Title Patient to report tolerance for yard work activities with modifications as necessary.    Time 6    Period Weeks    Status On-going                 Plan - 05/25/20 1100    Clinical Impression Statement Patient without new complaints. Patient performed gentle lumbopelvic ROM without complaints. Review of HS stretching required as patient reporting memory problems. Offered another written copy, however patient declined. Initiated wall squats with cues to shift L and cueing for proper  positioning. Patient with tendency to lean trunk forward when performing resisted trunk rotation. Patient reported plans to weed in his garden, thus educated patient on cutting large tasks into smaller tasks to avoid exacerbation of pain. Patient agreeable to suggestions on energy conservation. No complaints at end of session.    Comorbidities PCD, loss of hearing, kidney stone, ischemic cardiomyopathy, HLD, GERD, HTN, chronic systolic CHF, CAD, CABG 1610, 1993, 2014, anxiety, anemia, AAA, L2-sacrum decompresison/laminectomy 2007    Rehab Potential Good    PT Treatment/Interventions ADLs/Self Care Home Management;Cryotherapy;Electrical Stimulation;Iontophoresis 4mg /ml Dexamethasone;Moist Heat;Balance training;Therapeutic exercise;Therapeutic activities;Functional mobility training;Stair training;Gait training;DME Instruction;Ultrasound;Neuromuscular re-education;Patient/family education;Manual techniques;Taping;Energy conservation;Dry needling;Passive range of motion;Canalith Repostioning    PT Next Visit Plan progress hip strenghtening, stretching, and core strengthening    Consulted and Agree with Plan of Care Patient           Patient will benefit from skilled therapeutic intervention in order to improve the following deficits and impairments:  Cardiopulmonary status limiting activity, Decreased endurance, Hypomobility, Decreased activity tolerance, Decreased strength, Pain, Difficulty walking, Increased muscle spasms, Improper body mechanics, Decreased range of motion, Impaired flexibility, Postural dysfunction, Dizziness  Visit Diagnosis: Pain in left hip  Pain in right hip  Acute bilateral low back pain without sciatica  Other symptoms and signs involving the musculoskeletal system  Abnormal posture     Problem List Patient Active Problem List   Diagnosis Date Noted  . SOM (serous otitis media) 04/23/2020  . AKI (acute kidney injury) ([place]) 03/14/2020  . Exertional angina ([place])  03/12/2020  . Grief reaction 12/24/2019  . Chest pain 11/30/2019  . Finger laceration 04/28/2019  . Leg swelling 03/09/2019  . Hyperglycemia 01/22/2019  . Otitis externa 01/22/2019  . Edema 12/17/2018  . Right hip pain 12/17/2018  . Scrotal bleeding 03/01/2018  . Foot pain, right 03/01/2018  . Hemorrhoid 10/29/2017  . History of shingles 10/27/2017  . Anemia 03/07/2017  . Double vision 03/07/2017  . Arthritis 03/07/2017  . Medicare annual wellness visit, subsequent 11/24/2014  . Hearing loss 11/24/2014  . Pain in joint, lower leg 07/29/2014  . Sun-damaged skin 05/31/2014  . Chicken pox   . Measles   . Mumps   . Cardiomyopathy, ischemic 06/05/2013  . Atherosclerosis of coronary artery bypass graft with unstable angina pectoris ([place]) 04/19/2013  . Kidney stone 08/17/2011  . CAD (coronary artery disease)   . CERUMEN IMPACTION, BILATERAL 02/03/2010  . FLANK PAIN, RIGHT 02/03/2010  . Abdominal aortic aneurysm ([place]) 11/04/2009  . COLONIC POLYPS 07/01/2008  . Anxiety state 11/19/2007  . HYPERCHOLESTEROLEMIA 11/16/2007  . Gout 11/16/2007  . Essential hypertension 11/16/2007  . PERIPHERAL VASCULAR DISEASE 11/16/2007  . GERD 11/16/2007  . Osteoarthritis 11/16/2007  . BACK PAIN, LUMBAR 11/16/2007  . BPH (benign prostatic hyperplasia) 11/16/2007     Janene Harvey, PT, DPT 05/25/20 11:02 AM   [facility] [address]  [address] [place], [place], [address] Phone: [phone]   Fax:  [phone]  Name: Daniel Reeves MRN: [id number] Date of Birth: 1924/09/25

## 2020-05-26 NOTE — Telephone Encounter (Signed)
Left message on machine yesterday that I did send in rx for shower chair on 05/07/20 and that [facility] did received it.   Left another message on machine to follow up today and to just call us back if he has not heard anything.

## 2020-05-28 ENCOUNTER — Ambulatory Visit: Payer: Medicare Other | Attending: Physician Assistant | Admitting: Physical Therapy

## 2020-05-28 ENCOUNTER — Other Ambulatory Visit: Payer: Self-pay

## 2020-05-28 ENCOUNTER — Encounter: Payer: Self-pay | Admitting: Physical Therapy

## 2020-05-28 DIAGNOSIS — M25552 Pain in left hip: Secondary | ICD-10-CM | POA: Diagnosis not present

## 2020-05-28 DIAGNOSIS — R29898 Other symptoms and signs involving the musculoskeletal system: Secondary | ICD-10-CM | POA: Diagnosis not present

## 2020-05-28 DIAGNOSIS — M545 Low back pain, unspecified: Secondary | ICD-10-CM

## 2020-05-28 DIAGNOSIS — R293 Abnormal posture: Secondary | ICD-10-CM | POA: Diagnosis not present

## 2020-05-28 DIAGNOSIS — M25551 Pain in right hip: Secondary | ICD-10-CM | POA: Diagnosis not present

## 2020-05-28 NOTE — Therapy (Signed)
[facility] [address]  [place], [place], [address] Phone: [phone]   Fax:  [phone]  Physical Therapy Treatment  Patient Details  Name: Daniel Reeves MRN: [id number] Date of Birth: 1924/02/17 Referring Provider (PT): Erskine Emery, PA-C   Encounter Date: 05/28/2020   PT End of Session - 05/28/20 1013    Visit Number 9    Number of Visits 13    Date for PT Re-Evaluation 06/03/20    Authorization Type Medicare, AARP, BCBS    PT Start Time 0930    PT Stop Time 1012    PT Time Calculation (min) 42 min    Activity Tolerance Patient tolerated treatment well    Behavior During Therapy [facility] for tasks assessed/performed           Past Medical History:  Diagnosis Date  . Abdominal aortic aneurysm ([place])    a. [place] (1/14):  3.3 x 3.4 cm => f/u 11/2013  . Amebic dysentery   . AMEBIC DYSENTERY 11/19/2007   Qualifier: History of  By: Lenna Gilford MD, Deborra Medina   . Anemia 03/07/2017  . ANXIETY 11/19/2007   Qualifier: Diagnosis of  By: Lenna Gilford MD, Deborra Medina   . Arthritis 03/07/2017  . Atherosclerosis of coronary artery bypass graft with unstable angina pectoris ([place]) 04/19/2013  . BACK PAIN, LUMBAR 11/16/2007   Qualifier: Diagnosis of  By: Julien Girt CMA, Leigh    . Benign prostatic hypertrophy   . BENIGN PROSTATIC HYPERTROPHY, HX OF 11/16/2007   Qualifier: Diagnosis of  By: Julien Girt CMA, Leigh    . BRBPR (bright red blood per rectum) 11/01/2016  . CAD (coronary artery disease)    a. s/p CABG in 1979 and 1993;  b. LHC (5/14):  LM, LAD, CFX and RCA occluded; L-LAD ok, dLAD occluded after insertion of LIMA, S-OM occluded, S-PDA/AM 80-90 => PCI with Promus DES; EF 25%  . Cardiomyopathy, ischemic 06/05/2013  . Cerumen impaction    Bilateral  . Chicken pox as a child  . Chronic systolic CHF (congestive heart failure) ([place])   . COLONIC POLYPS 07/01/2008   Qualifier: Diagnosis of  By: Lenna Gilford MD, Deborra Medina   . Degenerative joint disease     . DEGENERATIVE JOINT DISEASE 11/16/2007   Qualifier: Diagnosis of  By: Julien Girt CMA, Marliss Czar    . Diverticulosis of colon   . DIVERTICULOSIS OF COLON 07/01/2008   Qualifier: Diagnosis of  By: Lenna Gilford MD, Deborra Medina   . Double vision 03/07/2017  . Essential hypertension 11/16/2007   Qualifier: Diagnosis of  By: Julien Girt CMA, Marliss Czar    . Fingernail abnormalities 03/07/2017  . FLANK PAIN, RIGHT 02/03/2010   Qualifier: History of  By: Lenna Gilford MD, Deborra Medina   . GERD (gastroesophageal reflux disease)   . GOUT 11/16/2007   Qualifier: Diagnosis of  By: Julien Girt CMA, Marliss Czar    . Hearing loss 11/24/2014  . Heart murmur   . History of shingles 10/27/2017  . Hypercholesterolemia   . HYPERCHOLESTEROLEMIA 11/16/2007   Qualifier: Diagnosis of  By: Julien Girt CMA, Marliss Czar    . Ischemic cardiomyopathy    a. echo (09/05/13): EF 35%, diffuse HK worsened distal septal, mid/distal inferior and apical region, grade 1 diastolic dysfunction, mild LAE.    [name] Kidney stone 08/17/2011  . Loss of hearing   . Lumbar back pain   . Measles as a child  . Medicare annual wellness visit, subsequent 11/24/2014   Sees Dr Delman Cheadle for dermatology Sees Dr  Roni Bread of Urology Sees Dr Stanford Breed of cardiology Sees Dr Virginia Rochester of Opthamology No further colonoscopies warranted       . Mumps as a child  . Nephrolithiasis   . Pain in joint, lower leg 07/29/2014  . PERIPHERAL VASCULAR DISEASE 11/16/2007   Qualifier: Diagnosis of  By: Julien Girt CMA, Marliss Czar    . Peripheral vascular disease ([place])   . Rectal bleeding 03/07/2017  . Shingles 07/29/2014  . Sun-damaged skin 05/31/2014    Past Surgical History:  Procedure Laterality Date  . CORONARY ANGIOPLASTY WITH STENT PLACEMENT  04/18/2013   RCA       . CORONARY ARTERY BYPASS GRAFT  1979   x4 SVG-DIAG-LAD, SVG-OM-PDA  . CORONARY ARTERY BYPASS GRAFT  1993   Redo x5 by Dr Harlow Asa; [facility], SVG-OM, SVG-AM-PL  . Decompressive laminectomy  01/2006   L2 - scarum by Dr. Shellia Carwin  . HEMORRHOID SURGERY     fissure with  hemorrhoid corrected at age 86  . INGUINAL HERNIA REPAIR  1994   Right by Dr Harlow Asa  . INGUINAL HERNIA REPAIR  1996   Left by Dr. Harlow Asa  . LEFT HEART CATHETERIZATION WITH CORONARY ANGIOGRAM N/A 09/23/2013   Procedure: LEFT HEART CATHETERIZATION WITH CORONARY ANGIOGRAM;  Surgeon: Blane Ohara, MD;  Location: [facility] CATH LAB;  Service: Cardiovascular;  Laterality: N/A;  . lens implants     for vision correction  . PERCUTANEOUS CORONARY STENT INTERVENTION (PCI-S) N/A 04/18/2013   Procedure: PERCUTANEOUS CORONARY STENT INTERVENTION (PCI-S);  Surgeon: Sherren Mocha, MD;  Location: [facility] CATH LAB;  Service: Cardiovascular;  Laterality: N/A;  . TONSILLECTOMY      There were no vitals filed for this visit.   Subjective Assessment - 05/28/20 0931    Subjective Did not take Tylenol today and seems to not have any back pain. Was able to do some weeding since last session without pain.    Pertinent History PCD, loss of hearing, kidney stone, ischemic cardiomyopathy, HLD, GERD, HTN, chronic systolic CHF, CAD, CABG 8469, 1993, 2014, anxiety, anemia, AAA, L2-sacrum decompresison/laminectomy 2007    Diagnostic tests lumbar xray 02/28/2020: no acute fractures.  Scoliosis with concavity to the left.  Prior laminectomies L2-S1.  No spondylolisthesis.    Patient Stated Goals get rid of pain    Currently in Pain? No/denies                             [facility] Adult PT Treatment/Exercise - 05/28/20 0001      Self-Care   Self-Care Lifting    Lifting lifting lightweight ball and orange medball from 9" stool with cueing to squat deeper, maintain neutral spine, and keep object close       Lumbar Exercises: Stretches   Passive Hamstring Stretch Left;30 seconds;Right;1 rep    Passive Hamstring Stretch Limitations supine with strap      Lumbar Exercises: Supine   Bridge 10 reps    Bridge Limitations good ROM    Bridge with [employer] 10 reps    Bridge with [employer] Limitations R HS cramp-  relieved with stretching      Knee/Hip Exercises: Stretches   Piriformis Stretch Right;Left;1 rep;30 seconds    Piriformis Stretch Limitations supine KTOS    Other Knee/Hip Stretches R/L figure 4 stretch 30" each in supine      Knee/Hip Exercises: Aerobic   Nustep Lvl 4, 6 min (UE/LE)      Knee/Hip Exercises: Sidelying  Clams with yellow TB x10 each    manual cueing to avoid rotating hips back                   PT Short Term Goals - 05/11/20 1135      PT SHORT TERM GOAL #1   Title Patient to be independent with initial HEP.    Time 3    Period Weeks    Status Achieved    Target Date 05/13/20             PT Long Term Goals - 05/05/20 1108      PT LONG TERM GOAL #1   Title Patient to be independent with advanced HEP.    Time 6    Period Weeks    Status On-going      PT LONG TERM GOAL #2   Title Patient to demonstrate lumbar/B hip AROM WFL and without pain limiting.    Time 6    Period Weeks    Status On-going      PT LONG TERM GOAL #3   Title Patient to demonstrate B hip strength >/=4+/5.    Time 6    Period Weeks    Status On-going      PT LONG TERM GOAL #4   Title Patient to demonstrate and recall proper body mechanics when lifting 20lbs from floor.    Time 6    Period Weeks    Status On-going      PT LONG TERM GOAL #5   Title Patient to report tolerance for yard work activities with modifications as necessary.    Time 6    Period Weeks    Status On-going                 Plan - 05/28/20 1013    Clinical Impression Statement Patient reporting no LBP today and was able to complete weeding since last session without any residual LBP. Patient however notes that he is still concerned about his LBP being kidney-related. Patient performed bridges, which were interrupted by intermittent HS cramps. This pain was resolved with HS stretching. Proceeded with hip rotation strengthening with patient reporting muscle fatigue, but no pain. Practice  proper lifting mechanics using weighted and lightweight objects with patient requiring cueing to deepen squat, maintain neutral spine, and keep object close to center of mass. Patient was able to demonstrate improved form after cueing. Ended session pain-free. Patient progressing well towards goals.    Comorbidities PCD, loss of hearing, kidney stone, ischemic cardiomyopathy, HLD, GERD, HTN, chronic systolic CHF, CAD, CABG 1610, 1993, 2014, anxiety, anemia, AAA, L2-sacrum decompresison/laminectomy 2007    Rehab Potential Good    PT Treatment/Interventions ADLs/Self Care Home Management;Cryotherapy;Electrical Stimulation;Iontophoresis 4mg /ml Dexamethasone;Moist Heat;Balance training;Therapeutic exercise;Therapeutic activities;Functional mobility training;Stair training;Gait training;DME Instruction;Ultrasound;Neuromuscular re-education;Patient/family education;Manual techniques;Taping;Energy conservation;Dry needling;Passive range of motion;Canalith Repostioning    PT Next Visit Plan progress hip strenghtening, stretching, and core strengthening    Consulted and Agree with Plan of Care Patient           Patient will benefit from skilled therapeutic intervention in order to improve the following deficits and impairments:  Cardiopulmonary status limiting activity, Decreased endurance, Hypomobility, Decreased activity tolerance, Decreased strength, Pain, Difficulty walking, Increased muscle spasms, Improper body mechanics, Decreased range of motion, Impaired flexibility, Postural dysfunction, Dizziness  Visit Diagnosis: Pain in left hip  Pain in right hip  Acute bilateral low back pain without sciatica  Other symptoms and signs involving the musculoskeletal system  Abnormal posture     Problem List Patient Active Problem List   Diagnosis Date Noted  . SOM (serous otitis media) 04/23/2020  . AKI (acute kidney injury) ([place]) 03/14/2020  . Exertional angina ([place]) 03/12/2020  . Grief reaction  12/24/2019  . Chest pain 11/30/2019  . Finger laceration 04/28/2019  . Leg swelling 03/09/2019  . Hyperglycemia 01/22/2019  . Otitis externa 01/22/2019  . Edema 12/17/2018  . Right hip pain 12/17/2018  . Scrotal bleeding 03/01/2018  . Foot pain, right 03/01/2018  . Hemorrhoid 10/29/2017  . History of shingles 10/27/2017  . Anemia 03/07/2017  . Double vision 03/07/2017  . Arthritis 03/07/2017  . Medicare annual wellness visit, subsequent 11/24/2014  . Hearing loss 11/24/2014  . Pain in joint, lower leg 07/29/2014  . Sun-damaged skin 05/31/2014  . Chicken pox   . Measles   . Mumps   . Cardiomyopathy, ischemic 06/05/2013  . Atherosclerosis of coronary artery bypass graft with unstable angina pectoris ([place]) 04/19/2013  . Kidney stone 08/17/2011  . CAD (coronary artery disease)   . CERUMEN IMPACTION, BILATERAL 02/03/2010  . FLANK PAIN, RIGHT 02/03/2010  . Abdominal aortic aneurysm ([place]) 11/04/2009  . COLONIC POLYPS 07/01/2008  . Anxiety state 11/19/2007  . HYPERCHOLESTEROLEMIA 11/16/2007  . Gout 11/16/2007  . Essential hypertension 11/16/2007  . PERIPHERAL VASCULAR DISEASE 11/16/2007  . GERD 11/16/2007  . Osteoarthritis 11/16/2007  . BACK PAIN, LUMBAR 11/16/2007  . BPH (benign prostatic hyperplasia) 11/16/2007     Janene Harvey, PT, DPT 05/28/20 10:15 AM   [facility] [address]  [place], [place], [address] Phone: [phone]   Fax:  [phone]  Name: Lajarvis Italiano MRN: [id number] Date of Birth: 1924/05/21

## 2020-06-03 ENCOUNTER — Other Ambulatory Visit: Payer: Self-pay

## 2020-06-03 ENCOUNTER — Ambulatory Visit: Payer: Medicare Other | Admitting: Physical Therapy

## 2020-06-03 ENCOUNTER — Encounter: Payer: Self-pay | Admitting: Physical Therapy

## 2020-06-03 DIAGNOSIS — R293 Abnormal posture: Secondary | ICD-10-CM

## 2020-06-03 DIAGNOSIS — M545 Low back pain, unspecified: Secondary | ICD-10-CM

## 2020-06-03 DIAGNOSIS — M25552 Pain in left hip: Secondary | ICD-10-CM | POA: Diagnosis not present

## 2020-06-03 DIAGNOSIS — M25551 Pain in right hip: Secondary | ICD-10-CM

## 2020-06-03 DIAGNOSIS — R29898 Other symptoms and signs involving the musculoskeletal system: Secondary | ICD-10-CM | POA: Diagnosis not present

## 2020-06-03 NOTE — Therapy (Addendum)
[facility] [address]  [place], [place], [address] Phone: [phone]   Fax:  [phone]  Physical Therapy Treatment  Patient Details  Name: Daniel Reeves MRN: [id number] Date of Birth: [birth year] Referring Provider (PT): Erskine Emery, PA-C    Progress Note Reporting Period 04/22/20 to 06/03/20  See note below for Objective Data and Assessment of Progress/Goals.     Encounter Date: 06/03/2020   PT End of Session - 06/03/20 1205    Visit Number 10    Number of Visits 13    Date for PT Re-Evaluation 06/03/20    Authorization Type Medicare, AARP, BCBS    PT Start Time 1102    PT Stop Time 1200    PT Time Calculation (min) 58 min    Activity Tolerance Patient tolerated treatment well    Behavior During Therapy WFL for tasks assessed/performed           Past Medical History:  Diagnosis Date  . Abdominal aortic aneurysm ([place])    a. [place] (1/14):  3.3 x 3.4 cm => f/u 11/2013  . Amebic dysentery   . AMEBIC DYSENTERY 11/19/2007   Qualifier: History of  By: Lenna Gilford MD, Deborra Medina   . Anemia 03/07/2017  . ANXIETY 11/19/2007   Qualifier: Diagnosis of  By: Lenna Gilford MD, Deborra Medina   . Arthritis 03/07/2017  . Atherosclerosis of coronary artery bypass graft with unstable angina pectoris ([place]) 04/19/2013  . BACK PAIN, LUMBAR 11/16/2007   Qualifier: Diagnosis of  By: Julien Girt CMA, Leigh    . Benign prostatic hypertrophy   . BENIGN PROSTATIC HYPERTROPHY, HX OF 11/16/2007   Qualifier: Diagnosis of  By: Julien Girt CMA, Leigh    . BRBPR (bright red blood per rectum) 11/01/2016  . CAD (coronary artery disease)    a. s/p CABG in 1979 and 1993;  b. LHC (5/14):  LM, LAD, CFX and RCA occluded; L-LAD ok, dLAD occluded after insertion of LIMA, S-OM occluded, S-PDA/AM 80-90 => PCI with Promus DES; EF 25%  . Cardiomyopathy, ischemic 06/05/2013  . Cerumen impaction    Bilateral  . Chicken pox as a child  . Chronic systolic CHF (congestive  heart failure) ([place])   . COLONIC POLYPS 07/01/2008   Qualifier: Diagnosis of  By: Lenna Gilford MD, Deborra Medina   . Degenerative joint disease   . DEGENERATIVE JOINT DISEASE 11/16/2007   Qualifier: Diagnosis of  By: Julien Girt CMA, Marliss Czar    . Diverticulosis of colon   . DIVERTICULOSIS OF COLON 07/01/2008   Qualifier: Diagnosis of  By: Lenna Gilford MD, Deborra Medina   . Double vision 03/07/2017  . Essential hypertension 11/16/2007   Qualifier: Diagnosis of  By: Julien Girt CMA, Marliss Czar    . Fingernail abnormalities 03/07/2017  . FLANK PAIN, RIGHT 02/03/2010   Qualifier: History of  By: Lenna Gilford MD, Deborra Medina   . GERD (gastroesophageal reflux disease)   . GOUT 11/16/2007   Qualifier: Diagnosis of  By: Julien Girt CMA, Marliss Czar    . Hearing loss 11/24/2014  . Heart murmur   . History of shingles 10/27/2017  . Hypercholesterolemia   . HYPERCHOLESTEROLEMIA 11/16/2007   Qualifier: Diagnosis of  By: Julien Girt CMA, Marliss Czar    . Ischemic cardiomyopathy    a. echo (09/05/13): EF 35%, diffuse HK worsened distal septal, mid/distal inferior and apical region, grade 1 diastolic dysfunction, mild LAE.    [name] Kidney stone 08/17/2011  . Loss of hearing   . Lumbar back pain   .  Measles as a child  . Medicare annual wellness visit, subsequent 11/24/2014   Sees Dr Delman Cheadle for dermatology Sees Dr Roni Bread of Urology Sees Dr Stanford Breed of cardiology Sees Dr Virginia Rochester of Opthamology No further colonoscopies warranted       . Mumps as a child  . Nephrolithiasis   . Pain in joint, lower leg 07/29/2014  . PERIPHERAL VASCULAR DISEASE 11/16/2007   Qualifier: Diagnosis of  By: Julien Girt CMA, Marliss Czar    . Peripheral vascular disease ([place])   . Rectal bleeding 03/07/2017  . Shingles 07/29/2014  . Sun-damaged skin 05/31/2014    Past Surgical History:  Procedure Laterality Date  . CORONARY ANGIOPLASTY WITH STENT PLACEMENT  04/18/2013   RCA       . CORONARY ARTERY BYPASS GRAFT  1979   x4 SVG-DIAG-LAD, SVG-OM-PDA  . CORONARY ARTERY BYPASS GRAFT  1993   Redo x5 by Dr Harlow Asa; [facility],  SVG-OM, SVG-AM-PL  . Decompressive laminectomy  01/2006   L2 - scarum by Dr. Shellia Carwin  . HEMORRHOID SURGERY     fissure with hemorrhoid corrected at age 47  . INGUINAL HERNIA REPAIR  1994   Right by Dr Harlow Asa  . INGUINAL HERNIA REPAIR  1996   Left by Dr. Harlow Asa  . LEFT HEART CATHETERIZATION WITH CORONARY ANGIOGRAM N/A 09/23/2013   Procedure: LEFT HEART CATHETERIZATION WITH CORONARY ANGIOGRAM;  Surgeon: Blane Ohara, MD;  Location[facility] CATH LAB;  Service: Cardiovascular;  Laterality: N/A;  . lens implants     for vision correction  . PERCUTANEOUS CORONARY STENT INTERVENTION (PCI-S) N/A 04/18/2013   Procedure: PERCUTANEOUS CORONARY STENT INTERVENTION (PCI-S);  Surgeon: Sherren Mocha, MD;  Location: [facility] CATH LAB;  Service: Cardiovascular;  Laterality: N/A;  . TONSILLECTOMY      There were no vitals filed for this visit.   Subjective Assessment - 06/03/20 1106    Subjective Has had a lot going on lately- went to the Urologist today who advised him of no new issues. Reports that he is still struggling with LBP in the AM, but is alleviated with Tylenol. Feels that he has not improved since starting PT. Upon further discussion with patient, patient admits to having memory problems and does in fact recall an improvement in LBP and functioning since working with PT.    Pertinent History PCD, loss of hearing, kidney stone, ischemic cardiomyopathy, HLD, GERD, HTN, chronic systolic CHF, CAD, CABG 4580, 1993, 2014, anxiety, anemia, AAA, L2-sacrum decompresison/laminectomy 2007    Diagnostic tests lumbar xray 02/28/2020: no acute fractures.  Scoliosis with concavity to the left.  Prior laminectomies L2-S1.  No spondylolisthesis.    Patient Stated Goals get rid of pain    Currently in Pain? Yes    Pain Score 3    Pain Location Knee    Pain Orientation Left    Pain Descriptors / Indicators Aching    Pain Type Acute pain              [facility] PT Assessment - 06/03/20 0001      Assessment    Medical Diagnosis LBP with sciatica    Referring Provider (PT) Erskine Emery, PA-C    Onset Date/Surgical Date 02/28/20      AROM   Right/Left Hip Right;Left    Right Hip Flexion 126    Right Hip External Rotation  35    Right Hip Internal Rotation  28   "pinch" in anterior hip   Right Hip ABduction 20   c/o quad cramp  Left Hip Flexion 119    Left Hip External Rotation  38    Left Hip Internal Rotation  25    Left Hip ABduction 25    Lumbar Flexion floor   bending knees as compensation   Lumbar Extension severely limited   pain in R toes   Lumbar - Right Side Bend jt line    Lumbar - Left Side Bend jt line    Lumbar - Right Rotation mildly limited    Lumbar - Left Rotation WNL      Strength   Overall Strength Comments Seated EOM    Right Hip Flexion 4+/5    Right Hip Extension 4+/5    Right Hip External Rotation  4+/5    Right Hip Internal Rotation 4/5    Right Hip ABduction 4+/5    Right Hip ADduction 4+/5    Left Hip Flexion 4+/5    Left Hip Extension 4+/5    Left Hip External Rotation 4+/5    Left Hip Internal Rotation 4/5    Left Hip ABduction 4+/5    Left Hip ADduction 4+/5                         [facility] Adult PT Treatment/Exercise - 06/03/20 0001      Self-Care   Self-Care Lifting    Lifting lifting 10# box from floor to chest 2x with cues for proper body mechanics      Knee/Hip Exercises: Stretches   Passive Hamstring Stretch Left;1 rep;30 seconds    Passive Hamstring Stretch Limitations supine with strap   to relieve muscle cramp     Knee/Hip Exercises: Aerobic   Nustep Lvl 3, 6 min (LE)                  PT Education - 06/03/20 1204    Education Details discussion on objective progress and remaining impairments; review and consolidation of HEP    Person(s) Educated Patient    Methods Explanation;Demonstration;Tactile cues;Verbal cues;Handout    Comprehension Verbalized understanding;Returned demonstration            PT  Short Term Goals - 06/03/20 1114      PT SHORT TERM GOAL #1   Title Patient to be independent with initial HEP.    Time 3    Period Weeks    Status Achieved    Target Date 05/13/20             PT Long Term Goals - 06/03/20 1114      PT LONG TERM GOAL #1   Title Patient to be independent with advanced HEP.    Time 6    Period Weeks    Status Achieved      PT LONG TERM GOAL #2   Title Patient to demonstrate lumbar/B hip AROM WFL and without pain limiting.    Time 6    Period Weeks    Status Partially Met   limited in B hip IR and lumbar extension     PT LONG TERM GOAL #3   Title Patient to demonstrate B hip strength >/=4+/5.    Time 6    Period Weeks    Status Partially Met   met with exception of B hip IR     PT LONG TERM GOAL #4   Title Patient to demonstrate and recall proper body mechanics when lifting 20lbs from floor.    Time 6    Period Weeks  Status Partially Met   able to perform with 10lbs and good form after cueing     PT LONG TERM GOAL #5   Title Patient to report tolerance for yard work activities with modifications as necessary.    Time 6    Period Weeks    Status Achieved   notes no pain performing these activities while on Tylenol                Plan - 06/03/20 1205    Clinical Impression Statement Patient arrived to session initially reporting no progress with therapy d/t continuing LBP in the AM. However, upon further discussion and questioning, patient admits to having "memory problems" which limit him in his ability to decipher if he feels better with PT. Ultimately, patient noting that he feels that he is able to perform ADLs and yardwork without pain at this time as long as he takes Tylenol daily. Objective measurements today revealed good improvement in lumbar flexion, extension, B sidebending, and L rotation. Hip strength and hip ROM goals now met with exception of B hip IR. Patient was also able to lift a 10lb box from floor to chest  without pain and with good body mechanics after cueing. HEP was reviewed and consolidated for max benefit, and patient reported understanding of this. Patient without complaints at end of session. Patient has made good objective progress towards goals and is able to complete daily activities without pain. Placing patient on 30 day hold at this time.    Comorbidities PCD, loss of hearing, kidney stone, ischemic cardiomyopathy, HLD, GERD, HTN, chronic systolic CHF, CAD, CABG 3299, 1993, 2014, anxiety, anemia, AAA, L2-sacrum decompresison/laminectomy 2007    Rehab Potential Good    PT Treatment/Interventions ADLs/Self Care Home Management;Cryotherapy;Electrical Stimulation;Iontophoresis 51m/ml Dexamethasone;Moist Heat;Balance training;Therapeutic exercise;Therapeutic activities;Functional mobility training;Stair training;Gait training;DME Instruction;Ultrasound;Neuromuscular re-education;Patient/family education;Manual techniques;Taping;Energy conservation;Dry needling;Passive range of motion;Canalith Repostioning    PT Next Visit Plan 30 day hold at this time    Consulted and Agree with Plan of Care Patient           Patient will benefit from skilled therapeutic intervention in order to improve the following deficits and impairments:  Cardiopulmonary status limiting activity, Decreased endurance, Hypomobility, Decreased activity tolerance, Decreased strength, Pain, Difficulty walking, Increased muscle spasms, Improper body mechanics, Decreased range of motion, Impaired flexibility, Postural dysfunction, Dizziness  Visit Diagnosis: Pain in left hip  Pain in right hip  Acute bilateral low back pain without sciatica  Other symptoms and signs involving the musculoskeletal system  Abnormal posture     Problem List Patient Active Problem List   Diagnosis Date Noted  . SOM (serous otitis media) 04/23/2020  . AKI (acute kidney injury) ([place] 03/14/2020  . Exertional angina ([place] 03/12/2020  .  Grief reaction 12/24/2019  . Chest pain 11/30/2019  . Finger laceration 04/28/2019  . Leg swelling 03/09/2019  . Hyperglycemia 01/22/2019  . Otitis externa 01/22/2019  . Edema 12/17/2018  . Right hip pain 12/17/2018  . Scrotal bleeding 03/01/2018  . Foot pain, right 03/01/2018  . Hemorrhoid 10/29/2017  . History of shingles 10/27/2017  . Anemia 03/07/2017  . Double vision 03/07/2017  . Arthritis 03/07/2017  . Medicare annual wellness visit, subsequent 11/24/2014  . Hearing loss 11/24/2014  . Pain in joint, lower leg 07/29/2014  . Sun-damaged skin 05/31/2014  . Chicken pox   . Measles   . Mumps   . Cardiomyopathy, ischemic 06/05/2013  . Atherosclerosis of coronary artery bypass graft with unstable  angina pectoris ([place]) 04/19/2013  . Kidney stone 08/17/2011  . CAD (coronary artery disease)   . CERUMEN IMPACTION, BILATERAL 02/03/2010  . FLANK PAIN, RIGHT 02/03/2010  . Abdominal aortic aneurysm ([place]) 11/04/2009  . COLONIC POLYPS 07/01/2008  . Anxiety state 11/19/2007  . HYPERCHOLESTEROLEMIA 11/16/2007  . Gout 11/16/2007  . Essential hypertension 11/16/2007  . PERIPHERAL VASCULAR DISEASE 11/16/2007  . GERD 11/16/2007  . Osteoarthritis 11/16/2007  . BACK PAIN, LUMBAR 11/16/2007  . BPH (benign prostatic hyperplasia) 11/16/2007     Janene Harvey, PT, DPT 06/03/20 12:14 PM   [facility] [address]  [place], [place], [address] Phone: [phone]   Fax:  [phone]  Name: Mart Colpitts MRN: [id number] Date of Birth: 1924-05-14   PHYSICAL THERAPY DISCHARGE SUMMARY  Visits from Start of Care: 10  Current functional level related to goals / functional outcomes: See above clinical impression; patient did not return after being put on 30 day hold   Remaining deficits: Decreased hip/lumbar AROM, decreased hip strength, difficulty lifting   Education / Equipment: HEP  Plan: Patient  agrees to discharge.  Patient goals were partially met. Patient is being discharged due to being pleased with the current functional level.  ?????     Janene Harvey, PT, DPT 07/28/20 10:39 AM

## 2020-06-30 ENCOUNTER — Encounter: Payer: Self-pay | Admitting: *Deleted

## 2020-07-07 ENCOUNTER — Other Ambulatory Visit: Payer: Self-pay | Admitting: *Deleted

## 2020-07-07 DIAGNOSIS — I714 Abdominal aortic aneurysm, without rupture, unspecified: Secondary | ICD-10-CM

## 2020-07-22 NOTE — Progress Notes (Signed)
HPI: FU CAD; s/p CABG in 1979 and 1993, ischemic CM, systolic CHF, AAA, HTN, HL. Patient underwent cardiac catheterization in May of 2014. The left main, LAD, circumflex and RCA were occluded. The LIMA to the LAD was patent and the distal LAD was occluded after the insertion. Saphenous vein graft to the obtuse marginal was occluded. Saphenous vein graft to the acute marginal and PDA had a high-grade lesion prior to insertion into the PDA of 80-90%. Ejection fraction was 25%. PCI: Promus Premier (3.5x12 mm) DES to the [facility].Repeat catheterization in October 2014 because of recurrent chest pain. The stent placed in the saphenous vein graft to the PDA had mild in-stent restenosis. Medical therapy recommended. Nuclear study 2/17 showed EF 35, inferolateral scar, no ischemia.Last echocardiogram February 2018 showed ejection fraction 91-47%, grade 1 diastolic dysfunction, mild to moderate aortic insufficiency, mild mitral regurgitation. Carotid Dopplers February 2018 showed less than 50% bilateral stenosis.Abdominal ultrasound8/20showed 3.5cm abdominal aortic aneurysm;distal vessel not well visualized. Since he was last seen,there is no dyspnea, exertional chest pain or syncope.  He does have some pain in his left lateral chest wall that increases with inspiration.  This has been going on for 1 day and he has been working vigorously in his yard.  Current Outpatient Medications  Medication Sig Dispense Refill  . allopurinol (ZYLOPRIM) 300 MG tablet Take 1 tablet by mouth once daily 90 tablet 1  . AMBULATORY NON FORMULARY MEDICATION Place 30 g rectally 2 (two) times daily. Medication Name: Nitroglycerin 0.125% gel. Apply pea size amount to the rectum two times a day for 6-8 weeks Avoid exercise within 30 minutes of application. 30 g 1  . aspirin EC 81 MG tablet Take 81 mg by mouth every morning.     . B Complex-C (B-COMPLEX WITH VITAMIN C) tablet Take 1 tablet by mouth daily.     . cetirizine  (ZYRTEC) 10 MG tablet Take 1 tablet (10 mg total) by mouth daily as needed for allergies. 30 tablet 2  . Cholecalciferol (VITAMIN D-3 PO) Take 5,000 Units by mouth daily with breakfast.     . famotidine (PEPCID) 20 MG tablet Take 1 tablet by mouth twice daily 180 tablet 1  . finasteride (PROSCAR) 5 MG tablet Takes every third day    . fluticasone (FLONASE) 50 MCG/ACT nasal spray Place 2 sprays into both nostrils daily as needed for allergies or rhinitis. 16 g 2  . folic acid (FOLVITE) 829 MCG tablet Take 400 mcg by mouth 2 (two) times daily.     [name] HYDROcodone-acetaminophen (NORCO/VICODIN) 5-325 MG tablet Take 0.5 tablets by mouth at bedtime.    . isosorbide mononitrate (IMDUR) 30 MG 24 hr tablet Take 3 tablets by mouth once daily 270 tablet 1  . losartan (COZAAR) 50 MG tablet Take 1 tablet (50 mg total) by mouth daily. 90 tablet 2  . metoprolol succinate (TOPROL-XL) 25 MG 24 hr tablet Take 1/2 (one-half) tablet by mouth once daily 45 tablet 2  . Misc Natural Products (OSTEO BI-FLEX ADV JOINT SHIELD) TABS Take 1 tablet by mouth 2 (two) times daily.     . nitroGLYCERIN (NITROSTAT) 0.4 MG SL tablet DISSOLVE ONE TABLET UNDER THE TONGUE EVERY 5 MINUTES AS NEEDED FOR CHEST PAIN.  DO NOT EXCEED A TOTAL OF 3 DOSES IN 15 MINUTES 25 tablet 2  . simvastatin (ZOCOR) 40 MG tablet TAKE 1 TABLET BY MOUTH ONCE DAILY IN THE EVENING 90 tablet 0   No current facility-administered medications for  this visit.     Past Medical History:  Diagnosis Date  . Abdominal aortic aneurysm ([place])    a. [place] (1/14):  3.3 x 3.4 cm => f/u 11/2013  . Amebic dysentery   . AMEBIC DYSENTERY 11/19/2007   Qualifier: History of  By: Lenna Gilford MD, Deborra Medina   . Anemia 03/07/2017  . ANXIETY 11/19/2007   Qualifier: Diagnosis of  By: Lenna Gilford MD, Deborra Medina   . Arthritis 03/07/2017  . Atherosclerosis of coronary artery bypass graft with unstable angina pectoris ([place]) 04/19/2013  . BACK PAIN, LUMBAR 11/16/2007   Qualifier: Diagnosis of  By: Julien Girt  CMA, Leigh    . Benign prostatic hypertrophy   . BENIGN PROSTATIC HYPERTROPHY, HX OF 11/16/2007   Qualifier: Diagnosis of  By: Julien Girt CMA, Leigh    . BRBPR (bright red blood per rectum) 11/01/2016  . CAD (coronary artery disease)    a. s/p CABG in 1979 and 1993;  b. LHC (5/14):  LM, LAD, CFX and RCA occluded; L-LAD ok, dLAD occluded after insertion of LIMA, S-OM occluded, S-PDA/AM 80-90 => PCI with Promus DES; EF 25%  . Cardiomyopathy, ischemic 06/05/2013  . Cerumen impaction    Bilateral  . Chicken pox as a child  . Chronic systolic CHF (congestive heart failure) ([place])   . COLONIC POLYPS 07/01/2008   Qualifier: Diagnosis of  By: Lenna Gilford MD, Deborra Medina   . Degenerative joint disease   . DEGENERATIVE JOINT DISEASE 11/16/2007   Qualifier: Diagnosis of  By: Julien Girt CMA, Marliss Czar    . Diverticulosis of colon   . DIVERTICULOSIS OF COLON 07/01/2008   Qualifier: Diagnosis of  By: Lenna Gilford MD, Deborra Medina   . Double vision 03/07/2017  . Essential hypertension 11/16/2007   Qualifier: Diagnosis of  By: Julien Girt CMA, Marliss Czar    . Fingernail abnormalities 03/07/2017  . FLANK PAIN, RIGHT 02/03/2010   Qualifier: History of  By: Lenna Gilford MD, Deborra Medina   . GERD (gastroesophageal reflux disease)   . GOUT 11/16/2007   Qualifier: Diagnosis of  By: Julien Girt CMA, Marliss Czar    . Hearing loss 11/24/2014  . Heart murmur   . History of shingles 10/27/2017  . Hypercholesterolemia   . HYPERCHOLESTEROLEMIA 11/16/2007   Qualifier: Diagnosis of  By: Julien Girt CMA, Marliss Czar    . Ischemic cardiomyopathy    a. echo (09/05/13): EF 35%, diffuse HK worsened distal septal, mid/distal inferior and apical region, grade 1 diastolic dysfunction, mild LAE.    [name] Kidney stone 08/17/2011  . Loss of hearing   . Lumbar back pain   . Measles as a child  . Medicare annual wellness visit, subsequent 11/24/2014   Sees Dr Delman Cheadle for dermatology Sees Dr Roni Bread of Urology Sees Dr Stanford Breed of cardiology Sees Dr Virginia Rochester of Opthamology No further colonoscopies warranted       . Mumps  as a child  . Nephrolithiasis   . Pain in joint, lower leg 07/29/2014  . PERIPHERAL VASCULAR DISEASE 11/16/2007   Qualifier: Diagnosis of  By: Julien Girt CMA, Marliss Czar    . Peripheral vascular disease ([place])   . Rectal bleeding 03/07/2017  . Shingles 07/29/2014  . Sun-damaged skin 05/31/2014    Past Surgical History:  Procedure Laterality Date  . CORONARY ANGIOPLASTY WITH STENT PLACEMENT  04/18/2013   RCA       . CORONARY ARTERY BYPASS GRAFT  1979   x4 SVG-DIAG-LAD, SVG-OM-PDA  . CORONARY ARTERY BYPASS GRAFT  1993   Redo x5 by Dr Harlow Asa; [facility] SVG-OM, SVG-AM-PL  .  Decompressive laminectomy  01/2006   L2 - scarum by Dr. Shellia Carwin  . HEMORRHOID SURGERY     fissure with hemorrhoid corrected at age 69  . INGUINAL HERNIA REPAIR  1994   Right by Dr Harlow Asa  . INGUINAL HERNIA REPAIR  1996   Left by Dr. Harlow Asa  . LEFT HEART CATHETERIZATION WITH CORONARY ANGIOGRAM N/A 09/23/2013   Procedure: LEFT HEART CATHETERIZATION WITH CORONARY ANGIOGRAM;  Surgeon: Blane Ohara, MD;  Location[facility] C[facility] LAB;  Service: Cardiovascular;  Laterality: N/A;  . lens implants     for vision correction  . PERCUTANEOUS CORONARY STENT INTERVENTION (PCI-S) N/A 04/18/2013   Procedure: PERCUTANEOUS CORONARY STENT INTERVENTION (PCI-S);  Surgeon: Sherren Mocha, MD;  Location: [facility] CATH LAB;  Service: Cardiovascular;  Laterality: N/A;  . TONSILLECTOMY      Social History   Socioeconomic History  . Marital status: Widowed    Spouse name: [name] x 72 years  . Number of children: 7  . Years of education: Not on file  . Highest education level: Not on file  Occupational History  . 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 Smoking status: Former Smoker    Quit date: 11/28/1944    Years since quitting: 75.7  . Smokeless tobacco: Never Used  Vaping Use  . Vaping Use: Never used  Substance and Sexual Activity  . Alcohol use: Yes    Alcohol/week: 2.0 standard drinks    Types: 2 Standard  drinks or equivalent per week    Comment: daily rum  or wine  . Drug use: No  . Sexual activity: Not Currently    Comment: lives with wife, no dietary restrictions.   Other Topics Concern  . Not on file  Social History Narrative   Married   7 children   Social Determinants of Health   Financial Resource Strain: Low Risk   . Difficulty of Paying Living Expenses: Not hard at all  Food Insecurity: No Food Insecurity  . Worried About Charity fundraiser in the Last Year: Never true  . Ran Out of Food in the Last Year: Never true  Transportation Needs: No Transportation Needs  . Lack of Transportation (Medical): No  . Lack of Transportation (Non-Medical): No  Physical Activity:   . Days of Exercise per Week: Not on file  . Minutes of Exercise per Session: Not on file  Stress:   . Feeling of Stress : Not on file  Social Connections:   . Frequency of Communication with Friends and Family: Not on file  . Frequency of Social Gatherings with Friends and Family: Not on file  . Attends Religious Services: Not on file  . Active Member of Clubs or Organizations: Not on file  . Attends Archivist Meetings: Not on file  . Marital Status: Not on file  Intimate Partner Violence:   . Fear of Current or Ex-Partner: Not on file  . Emotionally Abused: Not on file  . Physically Abused: Not on file  . Sexually Abused: Not on file    Family History  Problem Relation Age of Onset  . Parkinsonism Brother   . Diabetes Maternal Grandmother   . Depression Daughter   . Other Son        4 stents  . Heart disease Son   . Diabetes Son        type 2  . Colon cancer Neg Hx   . Esophageal cancer Neg Hx   .  Rectal cancer Neg Hx   . Stomach cancer Neg Hx     ROS: no fevers or chills, productive cough, hemoptysis, dysphasia, odynophagia, melena, hematochezia, dysuria, hematuria, rash, seizure activity, orthopnea, PND, pedal edema, claudication. Remaining systems are negative.  Physical  Exam: Well-developed well-nourished in no acute distress.  Skin is warm and dry.  HEENT is normal.  Neck is supple.  Chest is clear to auscultation with normal expansion.  Cardiovascular exam is regular rate and rhythm.  Abdominal exam nontender or distended. No masses palpated. Extremities show no edema. neuro grossly intact  ECG-rhythm at a rate of 55, left bundle branch block.  Personally reviewed  A/P  1 coronary artery disease status post coronary artery bypass graft-patient doing well with no chest pain. Continue medical therapy with aspirin and statin.  2 hypertension-patient's blood pressure is controlled. Continue present medical regimen.  3 hyperlipidemia-continue statin.  4 abdominal aortic aneurysm-we will arrange follow-up abdominal ultrasound.  5 ischemic cardiomyopathy-continue ARB and beta-blocker. Clinically he is not in congestive heart failure.  Kirk Ruths, MD

## 2020-07-29 ENCOUNTER — Other Ambulatory Visit: Payer: Self-pay

## 2020-07-29 ENCOUNTER — Ambulatory Visit (HOSPITAL_BASED_OUTPATIENT_CLINIC_OR_DEPARTMENT_OTHER)
Admission: RE | Admit: 2020-07-29 | Discharge: 2020-07-29 | Disposition: A | Discharge status: Home / Self Care | Payer: Medicare Other | Source: Ambulatory Visit | Attending: Cardiology | Admitting: Cardiology

## 2020-07-29 ENCOUNTER — Encounter: Payer: Self-pay | Admitting: Cardiology

## 2020-07-29 ENCOUNTER — Ambulatory Visit (INDEPENDENT_AMBULATORY_CARE_PROVIDER_SITE_OTHER): Payer: Medicare Other | Admitting: Cardiology

## 2020-07-29 VITALS — BP 124/62 | HR 55 | Ht 72.0 in | Wt 158.0 lb

## 2020-07-29 DIAGNOSIS — I714 Abdominal aortic aneurysm, without rupture, unspecified: Secondary | ICD-10-CM

## 2020-07-29 DIAGNOSIS — I10 Essential (primary) hypertension: Secondary | ICD-10-CM

## 2020-07-29 DIAGNOSIS — I2510 Atherosclerotic heart disease of native coronary artery without angina pectoris: Secondary | ICD-10-CM

## 2020-07-29 DIAGNOSIS — E7800 Pure hypercholesterolemia, unspecified: Secondary | ICD-10-CM

## 2020-07-29 DIAGNOSIS — I255 Ischemic cardiomyopathy: Secondary | ICD-10-CM | POA: Diagnosis not present

## 2020-07-29 NOTE — Patient Instructions (Signed)
Medication Instructions:  NO CHANGE *If you need a refill on your cardiac medications before your next appointment, please call your pharmacy*   Follow-Up: At [facility], you and your health needs are our priority.  As part of our continuing mission to provide you with exceptional heart care, we have created designated Provider Care Teams.  These Care Teams include your primary Cardiologist (physician) and Advanced Practice Providers (APPs -  Physician Assistants and Nurse Practitioners) who all work together to provide you with the care you need, when you need it.  We recommend signing up for the patient portal called "MyChart".  Sign up information is provided on this After Visit Summary.  MyChart is used to connect with patients for Virtual Visits (Telemedicine).  Patients are able to view lab/test results, encounter notes, upcoming appointments, etc.  Non-urgent messages can be sent to your provider as well.   To learn more about what you can do with MyChart, go to [email or web address].    Your next appointment:   6 month(s)  The format for your next appointment:   In Person  Provider:   Kirk Ruths, MD

## 2020-07-30 ENCOUNTER — Ambulatory Visit (INDEPENDENT_AMBULATORY_CARE_PROVIDER_SITE_OTHER): Payer: Medicare Other | Admitting: Family Medicine

## 2020-07-30 VITALS — BP 118/62 | HR 62 | Temp 97.9°F | Resp 13 | Ht 68.0 in | Wt 158.0 lb

## 2020-07-30 DIAGNOSIS — R739 Hyperglycemia, unspecified: Secondary | ICD-10-CM

## 2020-07-30 DIAGNOSIS — N289 Disorder of kidney and ureter, unspecified: Secondary | ICD-10-CM | POA: Diagnosis not present

## 2020-07-30 DIAGNOSIS — H9192 Unspecified hearing loss, left ear: Secondary | ICD-10-CM

## 2020-07-30 DIAGNOSIS — I255 Ischemic cardiomyopathy: Secondary | ICD-10-CM | POA: Diagnosis not present

## 2020-07-30 DIAGNOSIS — I10 Essential (primary) hypertension: Secondary | ICD-10-CM

## 2020-07-30 DIAGNOSIS — M109 Gout, unspecified: Secondary | ICD-10-CM | POA: Diagnosis not present

## 2020-07-30 DIAGNOSIS — E7800 Pure hypercholesterolemia, unspecified: Secondary | ICD-10-CM | POA: Diagnosis not present

## 2020-07-30 NOTE — Patient Instructions (Signed)

## 2020-07-30 NOTE — Assessment & Plan Note (Signed)
No change in meds, hydrate and monitor

## 2020-07-30 NOTE — Assessment & Plan Note (Signed)
He reports it is manageable and does not wat a referral at this time. He will let us know wen he is ready for referral

## 2020-07-30 NOTE — Assessment & Plan Note (Signed)
hgba1c acceptable, minimize simple carbs. Increase exercise as tolerated.  

## 2020-07-30 NOTE — Assessment & Plan Note (Signed)
Well controlled, no changes to meds. Encouraged heart healthy diet such as the DASH diet and exercise as tolerated.  °

## 2020-07-30 NOTE — Assessment & Plan Note (Signed)
Tolerating statin, encouraged heart healthy diet, avoid trans fats, minimize simple carbs and saturated fats. Increase exercise as tolerated 

## 2020-07-30 NOTE — Progress Notes (Signed)
Subjective:    Patient ID: Daniel Reeves, male    DOB: May 13, 1924, 86 y.o.   MRN: [id number]  Chief Complaint  Patient presents with  . 3 month followup    HPI Patient is in today for follow up on chronic medical concerns. No recent febrile illness or hospitalizations. He continues to live at home with the support of his children. Manages his ADLs well. His hearing is diminishing but he declines referral and feels it does not limit his activities. Denies CP/palp/SOB/HA/congestion/fevers/GI or GU c/o. Taking meds as prescribed  Past Medical History:  Diagnosis Date  . Abdominal aortic aneurysm ([place])    a. [place] (1/14):  3.3 x 3.4 cm => f/u 11/2013  . Amebic dysentery   . AMEBIC DYSENTERY 11/19/2007   Qualifier: History of  By: Lenna Gilford MD, Deborra Medina   . Anemia 03/07/2017  . ANXIETY 11/19/2007   Qualifier: Diagnosis of  By: Lenna Gilford MD, Deborra Medina   . Arthritis 03/07/2017  . Atherosclerosis of coronary artery bypass graft with unstable angina pectoris ([place]) 04/19/2013  . BACK PAIN, LUMBAR 11/16/2007   Qualifier: Diagnosis of  By: Julien Girt CMA, Leigh    . Benign prostatic hypertrophy   . BENIGN PROSTATIC HYPERTROPHY, HX OF 11/16/2007   Qualifier: Diagnosis of  By: Julien Girt CMA, Leigh    . BRBPR (bright red blood per rectum) 11/01/2016  . CAD (coronary artery disease)    a. s/p CABG in 1979 and 1993;  b. LHC (5/14):  LM, LAD, CFX and RCA occluded; L-LAD ok, dLAD occluded after insertion of LIMA, S-OM occluded, S-PDA/AM 80-90 => PCI with Promus DES; EF 25%  . Cardiomyopathy, ischemic 06/05/2013  . Cerumen impaction    Bilateral  . Chicken pox as a child  . Chronic systolic CHF (congestive heart failure) ([place])   . COLONIC POLYPS 07/01/2008   Qualifier: Diagnosis of  By: Lenna Gilford MD, Deborra Medina   . Degenerative joint disease   . DEGENERATIVE JOINT DISEASE 11/16/2007   Qualifier: Diagnosis of  By: Julien Girt CMA, Marliss Czar    . Diverticulosis of colon   . DIVERTICULOSIS OF COLON 07/01/2008   Qualifier: Diagnosis  of  By: Lenna Gilford MD, Deborra Medina   . Double vision 03/07/2017  . Essential hypertension 11/16/2007   Qualifier: Diagnosis of  By: Julien Girt CMA, Marliss Czar    . Fingernail abnormalities 03/07/2017  . FLANK PAIN, RIGHT 02/03/2010   Qualifier: History of  By: Lenna Gilford MD, Deborra Medina   . GERD (gastroesophageal reflux disease)   . GOUT 11/16/2007   Qualifier: Diagnosis of  By: Julien Girt CMA, Marliss Czar    . Hearing loss 11/24/2014  . Heart murmur   . History of shingles 10/27/2017  . Hypercholesterolemia   . HYPERCHOLESTEROLEMIA 11/16/2007   Qualifier: Diagnosis of  By: Julien Girt CMA, Marliss Czar    . Ischemic cardiomyopathy    a. echo (09/05/13): EF 35%, diffuse HK worsened distal septal, mid/distal inferior and apical region, grade 1 diastolic dysfunction, mild LAE.    [name] Kidney stone 08/17/2011  . Loss of hearing   . Lumbar back pain   . Measles as a child  . Medicare annual wellness visit, subsequent 11/24/2014   Sees Dr Delman Cheadle for dermatology Sees Dr Roni Bread of Urology Sees Dr Stanford Breed of cardiology Sees Dr Virginia Rochester of Opthamology No further colonoscopies warranted       . Mumps as a child  . Nephrolithiasis   . Pain in joint, lower leg 07/29/2014  . PERIPHERAL VASCULAR DISEASE 11/16/2007  Qualifier: Diagnosis of  By: Julien Girt CMA, Leigh    . Peripheral vascular disease ([place])   . Rectal bleeding 03/07/2017  . Shingles 07/29/2014  . Sun-damaged skin 05/31/2014    Past Surgical History:  Procedure Laterality Date  . CORONARY ANGIOPLASTY WITH STENT PLACEMENT  04/18/2013   RCA       . CORONARY ARTERY BYPASS GRAFT  1979   x4 SVG-DIAG-LAD, SVG-OM-PDA  . CORONARY ARTERY BYPASS GRAFT  1993   Redo x5 by Dr Harlow Asa; [facility], SVG-OM, SVG-AM-PL  . Decompressive laminectomy  01/2006   L2 - scarum by Dr. Shellia Carwin  . HEMORRHOID SURGERY     fissure with hemorrhoid corrected at age 86  . INGUINAL HERNIA REPAIR  1994   Right by Dr Harlow Asa  . INGUINAL HERNIA REPAIR  1996   Left by Dr. Harlow Asa  . LEFT HEART CATHETERIZATION WITH CORONARY  ANGIOGRAM N/A 09/23/2013   Procedure: LEFT HEART CATHETERIZATION WITH CORONARY ANGIOGRAM;  Surgeon: Blane Ohara, MD;  Location: [facility] CATH LAB;  Service: Cardiovascular;  Laterality: N/A;  . lens implants     for vision correction  . PERCUTANEOUS CORONARY STENT INTERVENTION (PCI-S) N/A 04/18/2013   Procedure: PERCUTANEOUS CORONARY STENT INTERVENTION (PCI-S);  Surgeon: Sherren Mocha, MD;  Location: [facility] CATH LAB;  Service: Cardiovascular;  Laterality: N/A;  . TONSILLECTOMY      Family History  Problem Relation Age of Onset  . Parkinsonism Brother   . Diabetes Maternal Grandmother   . Depression Daughter   . Other Son        4 stents  . Heart disease Son   . Diabetes Son        type 2  . Colon cancer Neg Hx   . Esophageal cancer Neg Hx   . Rectal cancer Neg Hx   . Stomach cancer Neg Hx     Social History   Socioeconomic History  . Marital status: Widowed    Spouse name: [name] x 72 years  . Number of children: 7  . Years of education: Not on file  . Highest education level: Not on file  Occupational History  . 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 Smoking status: Former Smoker    Quit date: 11/28/1944    Years since quitting: 75.7  . Smokeless tobacco: Never Used  Vaping Use  . Vaping Use: Never used  Substance and Sexual Activity  . Alcohol use: Yes    Alcohol/week: 2.0 standard drinks    Types: 2 Standard drinks or equivalent per week    Comment: daily rum  or wine  . Drug use: No  . Sexual activity: Not Currently    Comment: lives with wife, no dietary restrictions.   Other Topics Concern  . Not on file  Social History Narrative   Married   7 children   Social Determinants of Health   Financial Resource Strain: Low Risk   . Difficulty of Paying Living Expenses: Not hard at all  Food Insecurity: No Food Insecurity  . Worried About Charity fundraiser in the Last Year: Never true  . Ran Out of Food in the Last Year: Never  true  Transportation Needs: No Transportation Needs  . Lack of Transportation (Medical): No  . Lack of Transportation (Non-Medical): No  Physical Activity:   . Days of Exercise per Week: Not on file  . Minutes of Exercise per Session: Not on file  Stress:   . Feeling  of Stress : Not on file  Social Connections:   . Frequency of Communication with Friends and Family: Not on file  . Frequency of Social Gatherings with Friends and Family: Not on file  . Attends Religious Services: Not on file  . Active Member of Clubs or Organizations: Not on file  . Attends Archivist Meetings: Not on file  . Marital Status: Not on file  Intimate Partner Violence:   . Fear of Current or Ex-Partner: Not on file  . Emotionally Abused: Not on file  . Physically Abused: Not on file  . Sexually Abused: Not on file    Outpatient Medications Prior to Visit  Medication Sig Dispense Refill  . allopurinol (ZYLOPRIM) 300 MG tablet Take 1 tablet by mouth once daily 90 tablet 1  . AMBULATORY NON FORMULARY MEDICATION Place 30 g rectally 2 (two) times daily. Medication Name: Nitroglycerin 0.125% gel. Apply pea size amount to the rectum two times a day for 6-8 weeks Avoid exercise within 30 minutes of application. 30 g 1  . aspirin EC 81 MG tablet Take 81 mg by mouth every morning.     . B Complex-C (B-COMPLEX WITH VITAMIN C) tablet Take 1 tablet by mouth daily.     . Cholecalciferol (VITAMIN D-3 PO) Take 5,000 Units by mouth daily with breakfast.     . famotidine (PEPCID) 20 MG tablet Take 1 tablet by mouth twice daily 180 tablet 1  . finasteride (PROSCAR) 5 MG tablet Takes every third day    . fluticasone (FLONASE) 50 MCG/ACT nasal spray Place 2 sprays into both nostrils daily as needed for allergies or rhinitis. 16 g 2  . folic acid (FOLVITE) 244 MCG tablet Take 400 mcg by mouth 2 (two) times daily.     [name] HYDROcodone-acetaminophen (NORCO/VICODIN) 5-325 MG tablet Take 0.5 tablets by mouth at bedtime.      . isosorbide mononitrate (IMDUR) 30 MG 24 hr tablet Take 3 tablets by mouth once daily 270 tablet 1  . losartan (COZAAR) 50 MG tablet Take 1 tablet (50 mg total) by mouth daily. 90 tablet 2  . metoprolol succinate (TOPROL-XL) 25 MG 24 hr tablet Take 1/2 (one-half) tablet by mouth once daily 45 tablet 2  . Misc Natural Products (OSTEO BI-FLEX ADV JOINT SHIELD) TABS Take 1 tablet by mouth 2 (two) times daily.     . nitroGLYCERIN (NITROSTAT) 0.4 MG SL tablet DISSOLVE ONE TABLET UNDER THE TONGUE EVERY 5 MINUTES AS NEEDED FOR CHEST PAIN.  DO NOT EXCEED A TOTAL OF 3 DOSES IN 15 MINUTES 25 tablet 2  . simvastatin (ZOCOR) 40 MG tablet TAKE 1 TABLET BY MOUTH ONCE DAILY IN THE EVENING 90 tablet 0  . cetirizine (ZYRTEC) 10 MG tablet Take 1 tablet (10 mg total) by mouth daily as needed for allergies. 30 tablet 2   No facility-administered medications prior to visit.    Allergies  Allergen Reactions  . Lisinopril     REACTION: dizziness  . Methocarbamol     REACTION: pt states "dizzy"  . Other   . Pregabalin     REACTION: pt states "dizzy"  . Ramipril     REACTION: hives and dizziness    Review of Systems  Constitutional: Negative for fever and malaise/fatigue.  HENT: Positive for hearing loss. Negative for congestion, ear pain and tinnitus.   Eyes: Negative for blurred vision.  Respiratory: Negative for shortness of breath.   Cardiovascular: Negative for chest pain, palpitations and leg swelling.  Gastrointestinal: Negative for abdominal pain, blood in stool and nausea.  Genitourinary: Negative for dysuria and frequency.  Musculoskeletal: Negative for falls.  Skin: Negative for rash.  Neurological: Negative for dizziness, loss of consciousness and headaches.  Endo/Heme/Allergies: Negative for environmental allergies.  Psychiatric/Behavioral: Negative for depression. The patient is not nervous/anxious.         Objective:    Physical Exam Vitals and nursing note reviewed.   Constitutional:      General: He is not in acute distress.    Appearance: He is well-developed.  HENT:     Head: Normocephalic and atraumatic.     Nose: Nose normal.  Eyes:     General:        Right eye: No discharge.        Left eye: No discharge.  Cardiovascular:     Rate and Rhythm: Normal rate and regular rhythm.     Heart sounds: No murmur heard.   Pulmonary:     Effort: Pulmonary effort is normal.     Breath sounds: Normal breath sounds.  Abdominal:     General: Bowel sounds are normal.     Palpations: Abdomen is soft.     Tenderness: There is no abdominal tenderness.  Musculoskeletal:     Cervical back: Normal range of motion and neck supple.  Skin:    General: Skin is warm and dry.  Neurological:     Mental Status: He is alert and oriented to person, place, and time.     BP 118/62 (BP Location: Right Arm, Patient Position: Sitting, Cuff Size: Small)   Pulse 62   Temp 97.9 F (36.6 C) (Oral)   Resp 13   Ht 5\' 8"  (1.727 m)   Wt 158 lb (71.7 kg)   SpO2 97%   BMI 24.02 kg/m  Wt Readings from Last 3 Encounters:  07/30/20 158 lb (71.7 kg)  07/29/20 158 lb (71.7 kg)  04/23/20 155 lb 9.6 oz (70.6 kg)    Diabetic Foot Exam - Simple   No data filed     Lab Results  Component Value Date   WBC 9.1 04/03/2020   HGB 13.2 04/03/2020   HCT 39.2 04/03/2020   PLT 240.0 04/03/2020   GLUCOSE 107 (H) 03/20/2020   CHOL 82 03/12/2020   TRIG 90.0 03/12/2020   HDL 30.50 (L) 03/12/2020   LDLCALC 33 03/12/2020   ALT 21 03/20/2020   AST 27 03/20/2020   NA 137 03/20/2020   K 4.4 03/20/2020   CL 105 03/20/2020   CREATININE 1.22 03/20/2020   BUN 47 (H) 03/20/2020   CO2 24 03/20/2020   TSH 2.21 03/12/2020   PSA 25.19 (H) 04/03/2020   INR 1.09 02/04/2018   HGBA1C 5.7 03/12/2020    Lab Results  Component Value Date   TSH 2.21 03/12/2020   Lab Results  Component Value Date   WBC 9.1 04/03/2020   HGB 13.2 04/03/2020   HCT 39.2 04/03/2020   MCV 99.9 04/03/2020    PLT 240.0 04/03/2020   Lab Results  Component Value Date   NA 137 03/20/2020   K 4.4 03/20/2020   CO2 24 03/20/2020   GLUCOSE 107 (H) 03/20/2020   BUN 47 (H) 03/20/2020   CREATININE 1.22 03/20/2020   BILITOT 0.8 03/20/2020   ALKPHOS 84 03/20/2020   AST 27 03/20/2020   ALT 21 03/20/2020   PROT 6.3 03/20/2020   ALBUMIN 4.0 03/20/2020   CALCIUM 9.6 03/20/2020   ANIONGAP 7 11/30/2019  GFR 55.32 (L) 03/20/2020   Lab Results  Component Value Date   CHOL 82 03/12/2020   Lab Results  Component Value Date   HDL 30.50 (L) 03/12/2020   Lab Results  Component Value Date   LDLCALC 33 03/12/2020   Lab Results  Component Value Date   TRIG 90.0 03/12/2020   Lab Results  Component Value Date   CHOLHDL 3 03/12/2020   Lab Results  Component Value Date   HGBA1C 5.7 03/12/2020       Assessment & Plan:   Problem List Items Addressed This Visit    HYPERCHOLESTEROLEMIA    Tolerating statin, encouraged heart healthy diet, avoid trans fats, minimize simple carbs and saturated fats. Increase exercise as tolerated      Relevant Orders   Lipid panel   Gout - Primary    No change in meds, hydrate and monitor      Relevant Orders   Uric acid   Essential hypertension    Well controlled, no changes to meds. Encouraged heart healthy diet such as the DASH diet and exercise as tolerated.       Relevant Orders   CBC   TSH   Hearing loss    He reports it is manageable and does not wat a referral at this time. He will let us know wen he is ready for referral      Hyperglycemia    hgba1c acceptable, minimize simple carbs. Increase exercise as tolerated.      Relevant Orders   Hemoglobin A1c    Other Visit Diagnoses    Renal insufficiency       Relevant Orders   Comprehensive metabolic panel      I have discontinued Maudie Flakes. Shadrick "Dick"'s cetirizine. I am also having him maintain his folic acid, Osteo Bi-Flex Adv Joint Shield, Cholecalciferol (VITAMIN D-3 PO),  aspirin EC, finasteride, B-complex with vitamin C, AMBULATORY NON FORMULARY MEDICATION, losartan, allopurinol, simvastatin, HYDROcodone-acetaminophen, fluticasone, famotidine, isosorbide mononitrate, metoprolol succinate, and nitroGLYCERIN.  No orders of the defined types were placed in this encounter.    Penni Homans, MD

## 2020-07-31 LAB — COMPREHENSIVE METABOLIC PANEL
AG Ratio: 1.6 (calc) (ref 1.0–2.5)
ALT: 17 U/L (ref 9–46)
AST: 22 U/L (ref 10–35)
Albumin: 4.1 g/dL (ref 3.6–5.1)
Alkaline phosphatase (APISO): 62 U/L (ref 35–144)
BUN/Creatinine Ratio: 28 (calc) — ABNORMAL HIGH (ref 6–22)
BUN: 31 mg/dL — ABNORMAL HIGH (ref 7–25)
CO2: 22 mmol/L (ref 20–32)
Calcium: 9.4 mg/dL (ref 8.6–10.3)
Chloride: 108 mmol/L (ref 98–110)
Creat: 1.11 mg/dL (ref 0.70–1.11)
Globulin: 2.5 g/dL (calc) (ref 1.9–3.7)
Glucose, Bld: 96 mg/dL (ref 65–99)
Potassium: 5.3 mmol/L (ref 3.5–5.3)
Sodium: 139 mmol/L (ref 135–146)
Total Bilirubin: 0.8 mg/dL (ref 0.2–1.2)
Total Protein: 6.6 g/dL (ref 6.1–8.1)

## 2020-07-31 LAB — LIPID PANEL
Cholesterol: 109 mg/dL (ref <200)
HDL: 39 mg/dL — ABNORMAL LOW (ref >=40)
LDL Cholesterol (Calc): 49 mg/dL (calc)
Non-HDL Cholesterol (Calc): 70 mg/dL (calc) (ref <130)
Total CHOL/HDL Ratio: 2.8 (calc) (ref <5.0)
Triglycerides: 128 mg/dL (ref <150)

## 2020-07-31 LAB — HEMOGLOBIN A1C
Hgb A1c MFr Bld: 5.3 % of total Hgb (ref <5.7)
Mean Plasma Glucose: 105 (calc)
eAG (mmol/L): 5.8 (calc)

## 2020-07-31 LAB — TSH: TSH: 1.72 mIU/L (ref 0.40–4.50)

## 2020-07-31 LAB — CBC
HCT: 39.6 % (ref 38.5–50.0)
Hemoglobin: 13.4 g/dL (ref 13.2–17.1)
MCH: 33.8 pg — ABNORMAL HIGH (ref 27.0–33.0)
MCHC: 33.8 g/dL (ref 32.0–36.0)
MCV: 99.7 fL (ref 80.0–100.0)
MPV: 10.3 fL (ref 7.5–12.5)
Platelets: 227 10*3/uL (ref 140–400)
RBC: 3.97 10*6/uL — ABNORMAL LOW (ref 4.20–5.80)
RDW: 12 % (ref 11.0–15.0)
WBC: 8.3 10*3/uL (ref 3.8–10.8)

## 2020-07-31 LAB — URIC ACID: Uric Acid, Serum: 3.1 mg/dL — ABNORMAL LOW (ref 4.0–8.0)

## 2020-08-10 ENCOUNTER — Other Ambulatory Visit: Payer: Self-pay | Admitting: Family Medicine

## 2020-08-10 DIAGNOSIS — I25700 Atherosclerosis of coronary artery bypass graft(s), unspecified, with unstable angina pectoris: Secondary | ICD-10-CM

## 2020-08-10 DIAGNOSIS — E7800 Pure hypercholesterolemia, unspecified: Secondary | ICD-10-CM

## 2020-08-10 DIAGNOSIS — I10 Essential (primary) hypertension: Secondary | ICD-10-CM

## 2020-08-28 DIAGNOSIS — R972 Elevated prostate specific antigen [PSA]: Secondary | ICD-10-CM | POA: Diagnosis not present

## 2020-08-30 ENCOUNTER — Other Ambulatory Visit: Payer: Self-pay | Admitting: Family Medicine

## 2020-09-03 DIAGNOSIS — N401 Enlarged prostate with lower urinary tract symptoms: Secondary | ICD-10-CM | POA: Diagnosis not present

## 2020-09-03 DIAGNOSIS — R351 Nocturia: Secondary | ICD-10-CM | POA: Diagnosis not present

## 2020-09-08 ENCOUNTER — Ambulatory Visit: Payer: Medicare Other | Admitting: Family Medicine

## 2020-09-21 DIAGNOSIS — L814 Other melanin hyperpigmentation: Secondary | ICD-10-CM | POA: Diagnosis not present

## 2020-09-21 DIAGNOSIS — X32XXXS Exposure to sunlight, sequela: Secondary | ICD-10-CM | POA: Diagnosis not present

## 2020-09-21 DIAGNOSIS — L821 Other seborrheic keratosis: Secondary | ICD-10-CM | POA: Diagnosis not present

## 2020-09-21 DIAGNOSIS — Z85828 Personal history of other malignant neoplasm of skin: Secondary | ICD-10-CM | POA: Diagnosis not present

## 2020-09-21 DIAGNOSIS — D692 Other nonthrombocytopenic purpura: Secondary | ICD-10-CM | POA: Diagnosis not present

## 2020-09-21 DIAGNOSIS — L853 Xerosis cutis: Secondary | ICD-10-CM | POA: Diagnosis not present

## 2020-09-21 DIAGNOSIS — D1801 Hemangioma of skin and subcutaneous tissue: Secondary | ICD-10-CM | POA: Diagnosis not present

## 2020-10-13 ENCOUNTER — Encounter: Payer: Self-pay | Admitting: Medical

## 2020-10-13 ENCOUNTER — Ambulatory Visit (INDEPENDENT_AMBULATORY_CARE_PROVIDER_SITE_OTHER): Payer: Medicare Other | Admitting: Medical

## 2020-10-13 ENCOUNTER — Other Ambulatory Visit: Payer: Self-pay

## 2020-10-13 VITALS — BP 125/58 | HR 70 | Resp 18 | Ht 68.0 in | Wt 155.6 lb

## 2020-10-13 DIAGNOSIS — I255 Ischemic cardiomyopathy: Secondary | ICD-10-CM

## 2020-10-13 DIAGNOSIS — L600 Ingrowing nail: Secondary | ICD-10-CM

## 2020-10-13 MED ORDER — CEPHALEXIN 500 MG PO CAPS: 500.0000 mg | ORAL_CAPSULE | Freq: Two times a day (BID) | ORAL | 0 refills | Status: DC

## 2020-10-13 NOTE — Progress Notes (Signed)
Subjective:    Patient ID: Daniel Reeves, male    DOB: 1924-04-10, 86 y.o.   MRN: [id number]  HPI  Pt in with rt great toe pain that is red medial aspect. He cut nail on Saturday. He states medial edge was still attached and he had to pull at nail. Next day medial aspect of nail/toe got pinkish red and tender.   Review of Systems  Constitutional: Negative for chills, fatigue and fever.  Respiratory: Negative for cough, shortness of breath and wheezing.   Cardiovascular: Negative for chest pain and palpitations.  Musculoskeletal:       Rt great toe- medial toe redness and some pain.  Hematological: Negative for adenopathy. Does not bruise/bleed easily.    Past Medical History:  Diagnosis Date  . Abdominal aortic aneurysm ([place])    a. [place] (1/14):  3.3 x 3.4 cm => f/u 11/2013  . Amebic dysentery   . AMEBIC DYSENTERY 11/19/2007   Qualifier: History of  By: Lenna Gilford MD, Deborra Medina   . Anemia 03/07/2017  . ANXIETY 11/19/2007   Qualifier: Diagnosis of  By: Lenna Gilford MD, Deborra Medina   . Arthritis 03/07/2017  . Atherosclerosis of coronary artery bypass graft with unstable angina pectoris ([place]) 04/19/2013  . BACK PAIN, LUMBAR 11/16/2007   Qualifier: Diagnosis of  By: Julien Girt CMA, Leigh    . Benign prostatic hypertrophy   . BENIGN PROSTATIC HYPERTROPHY, HX OF 11/16/2007   Qualifier: Diagnosis of  By: Julien Girt CMA, Leigh    . BRBPR (bright red blood per rectum) 11/01/2016  . CAD (coronary artery disease)    a. s/p CABG in 1979 and 1993;  b. LHC (5/14):  LM, LAD, CFX and RCA occluded; L-LAD ok, dLAD occluded after insertion of LIMA, S-OM occluded, S-PDA/AM 80-90 => PCI with Promus DES; EF 25%  . Cardiomyopathy, ischemic 06/05/2013  . Cerumen impaction    Bilateral  . Chicken pox as a child  . Chronic systolic CHF (congestive heart failure) ([place])   . COLONIC POLYPS 07/01/2008   Qualifier: Diagnosis of  By: Lenna Gilford MD, Deborra Medina   . Degenerative joint disease   . DEGENERATIVE JOINT DISEASE 11/16/2007    Qualifier: Diagnosis of  By: Julien Girt CMA, Marliss Czar    . Diverticulosis of colon   . DIVERTICULOSIS OF COLON 07/01/2008   Qualifier: Diagnosis of  By: Lenna Gilford MD, Deborra Medina   . Double vision 03/07/2017  . Essential hypertension 11/16/2007   Qualifier: Diagnosis of  By: Julien Girt CMA, Marliss Czar    . Fingernail abnormalities 03/07/2017  . FLANK PAIN, RIGHT 02/03/2010   Qualifier: History of  By: Lenna Gilford MD, Deborra Medina   . GERD (gastroesophageal reflux disease)   . GOUT 11/16/2007   Qualifier: Diagnosis of  By: Julien Girt CMA, Marliss Czar    . Hearing loss 11/24/2014  . Heart murmur   . History of shingles 10/27/2017  . Hypercholesterolemia   . HYPERCHOLESTEROLEMIA 11/16/2007   Qualifier: Diagnosis of  By: Julien Girt CMA, Marliss Czar    . Ischemic cardiomyopathy    a. echo (09/05/13): EF 35%, diffuse HK worsened distal septal, mid/distal inferior and apical region, grade 1 diastolic dysfunction, mild LAE.    [name] Kidney stone 08/17/2011  . Loss of hearing   . Lumbar back pain   . Measles as a child  . Medicare annual wellness visit, subsequent 11/24/2014   Sees Dr Delman Cheadle for dermatology Sees Dr Roni Bread of Urology Sees Dr Stanford Breed of cardiology Sees Dr Virginia Rochester of Opthamology No further colonoscopies warranted       .  Mumps as a child  . Nephrolithiasis   . Pain in joint, lower leg 07/29/2014  . PERIPHERAL VASCULAR DISEASE 11/16/2007   Qualifier: Diagnosis of  By: Julien Girt CMA, Marliss Czar    . Peripheral vascular disease ([place])   . Rectal bleeding 03/07/2017  . Shingles 07/29/2014  . Sun-damaged skin 05/31/2014     Social History   Socioeconomic History  . Marital status: Widowed    Spouse name: Daniel Reeves x 72 years  . Number of children: 7  . Years of education: Not on file  . Highest education level: Not on file  Occupational History  . 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 Smoking status: Former Smoker    Quit date: 11/28/1944    Years since quitting: 75.9  . Smokeless tobacco: Never Used  Vaping Use  .  Vaping Use: Never used  Substance and Sexual Activity  . Alcohol use: Yes    Alcohol/week: 2.0 standard drinks    Types: 2 Standard drinks or equivalent per week    Comment: daily rum  or wine  . Drug use: No  . Sexual activity: Not Currently    Comment: lives with wife, no dietary restrictions.   Other Topics Concern  . Not on file  Social History Narrative   Married   7 children   Social Determinants of Health   Financial Resource Strain: Low Risk   . Difficulty of Paying Living Expenses: Not hard at all  Food Insecurity: No Food Insecurity  . Worried About Charity fundraiser in the Last Year: Never true  . Ran Out of Food in the Last Year: Never true  Transportation Needs: No Transportation Needs  . Lack of Transportation (Medical): No  . Lack of Transportation (Non-Medical): No  Physical Activity:   . Days of Exercise per Week: Not on file  . Minutes of Exercise per Session: Not on file  Stress:   . Feeling of Stress : Not on file  Social Connections:   . Frequency of Communication with Friends and Family: Not on file  . Frequency of Social Gatherings with Friends and Family: Not on file  . Attends Religious Services: Not on file  . Active Member of Clubs or Organizations: Not on file  . Attends Archivist Meetings: Not on file  . Marital Status: Not on file  Intimate Partner Violence:   . Fear of Current or Ex-Partner: Not on file  . Emotionally Abused: Not on file  . Physically Abused: Not on file  . Sexually Abused: Not on file    Past Surgical History:  Procedure Laterality Date  . CORONARY ANGIOPLASTY WITH STENT PLACEMENT  04/18/2013   RCA       . CORONARY ARTERY BYPASS GRAFT  1979   x4 SVG-DIAG-LAD, SVG-OM-PDA  . CORONARY ARTERY BYPASS GRAFT  1993   Redo x5 by Dr Harlow Asa; [facility], SVG-OM, SVG-AM-PL  . Decompressive laminectomy  01/2006   L2 - scarum by Dr. Shellia Carwin  . HEMORRHOID SURGERY     fissure with hemorrhoid corrected at age 75    . INGUINAL HERNIA REPAIR  1994   Right by Dr Harlow Asa  . INGUINAL HERNIA REPAIR  1996   Left by Dr. Harlow Asa  . LEFT HEART CATHETERIZATION WITH CORONARY ANGIOGRAM N/A 09/23/2013   Procedure: LEFT HEART CATHETERIZATION WITH CORONARY ANGIOGRAM;  Surgeon: Blane Ohara, MD;  Location: [facility] CATH LAB;  Service: Cardiovascular;  Laterality: N/A;  . lens  implants     for vision correction  . PERCUTANEOUS CORONARY STENT INTERVENTION (PCI-S) N/A 04/18/2013   Procedure: PERCUTANEOUS CORONARY STENT INTERVENTION (PCI-S);  Surgeon: Sherren Mocha, MD;  Location: [facility] CATH LAB;  Service: Cardiovascular;  Laterality: N/A;  . TONSILLECTOMY      Family History  Problem Relation Age of Onset  . Parkinsonism Brother   . Diabetes Maternal Grandmother   . Depression Daughter   . Other Son        4 stents  . Heart disease Son   . Diabetes Son        type 2  . Colon cancer Neg Hx   . Esophageal cancer Neg Hx   . Rectal cancer Neg Hx   . Stomach cancer Neg Hx     Allergies  Allergen Reactions  . Lisinopril     REACTION: dizziness  . Methocarbamol     REACTION: pt states "dizzy"  . Other   . Pregabalin     REACTION: pt states "dizzy"  . Ramipril     REACTION: hives and dizziness    Current Outpatient Medications on File Prior to Visit  Medication Sig Dispense Refill  . allopurinol (ZYLOPRIM) 300 MG tablet Take 1 tablet by mouth once daily 90 tablet 1  . AMBULATORY NON FORMULARY MEDICATION Place 30 g rectally 2 (two) times daily. Medication Name: Nitroglycerin 0.125% gel. Apply pea size amount to the rectum two times a day for 6-8 weeks Avoid exercise within 30 minutes of application. 30 g 1  . aspirin EC 81 MG tablet Take 81 mg by mouth every morning.     . B Complex-C (B-COMPLEX WITH VITAMIN C) tablet Take 1 tablet by mouth daily.     . Cholecalciferol (VITAMIN D-3 PO) Take 5,000 Units by mouth daily with breakfast.     . famotidine (PEPCID) 20 MG tablet Take 1 tablet by mouth twice daily 180  tablet 1  . finasteride (PROSCAR) 5 MG tablet Takes every third day    . fluticasone (FLONASE) 50 MCG/ACT nasal spray Place 2 sprays into both nostrils daily as needed for allergies or rhinitis. 16 g 2  . folic acid (FOLVITE) 546 MCG tablet Take 400 mcg by mouth 2 (two) times daily.     [name] HYDROcodone-acetaminophen (NORCO/VICODIN) 5-325 MG tablet Take 0.5 tablets by mouth at bedtime.    . isosorbide mononitrate (IMDUR) 30 MG 24 hr tablet Take 3 tablets by mouth once daily 270 tablet 1  . losartan (COZAAR) 50 MG tablet Take 1 tablet (50 mg total) by mouth daily. 90 tablet 2  . metoprolol succinate (TOPROL-XL) 25 MG 24 hr tablet Take 1/2 (one-half) tablet by mouth once daily 45 tablet 2  . Misc Natural Products (OSTEO BI-FLEX ADV JOINT SHIELD) TABS Take 1 tablet by mouth 2 (two) times daily.     . nitroGLYCERIN (NITROSTAT) 0.4 MG SL tablet DISSOLVE ONE TABLET UNDER THE TONGUE EVERY 5 MINUTES AS NEEDED FOR CHEST PAIN.  DO NOT EXCEED A TOTAL OF 3 DOSES IN 15 MINUTES 25 tablet 2  . simvastatin (ZOCOR) 40 MG tablet TAKE 1 TABLET BY MOUTH ONCE DAILY IN THE EVENING 90 tablet 1   No current facility-administered medications on file prior to visit.    BP (!) 125/58   Pulse 70   Resp 18   Ht 5\' 8"  (1.727 m)   Wt 155 lb 9.6 oz (70.6 kg)   SpO2 100%   BMI 23.66 kg/m  Objective:   Physical Exam  General- No acute distress. Pleasant patient. Neck- Full range of motion, no jvd Lungs- Clear, even and unlabored. Heart- regular rate and rhythm. Neurologic- CNII- XII grossly intact. Rt foot- great toe medial aspect mild reddish pink and tender. Looks toe mild ingrown.        Assessment & Plan:  You do appear to have rt side medial aspect ingrown toe nail with  infection after mild trauma. Recommend warm salt water soaks twice daily and take keflex antibiotic.  Advise not to trim/cut nail presently.  If redness, swelling and tenderness does not improve may need partial removal of toenail  with podiatrist.  Follow up 7 days or as needed  [employer], [employer]

## 2020-10-13 NOTE — Patient Instructions (Addendum)
You do appear to have rt side medial aspect ingrown toe nail with  infection after mild trauma. Recommend warm salt water soaks twice daily and take keflex antibiotic.  Advise not to trim/cut nail presently.  If redness, swelling and tenderness does not improve may need partial removal of toenail with podiatrist.  Follow up 7 days or as needed

## 2020-10-14 ENCOUNTER — Telehealth: Payer: Self-pay

## 2020-10-14 NOTE — Telephone Encounter (Signed)
It is hard to say exact amount. Enough water to soak great toe  and enough salt to make water look cloudy grey if can't get epson.

## 2020-10-14 NOTE — Telephone Encounter (Signed)
Pt called stating he saw Evern Core yesterday. Pt was told to do salt water rinses. Pt states he usually uses Epson salt but does not have any right now. Pt asked if table salt would be okay. Pt also wanted to know how many tablespoons of salt he needs to use and how often. Please advise.

## 2020-10-14 NOTE — Telephone Encounter (Signed)
Pt.notified

## 2020-10-15 DIAGNOSIS — H26493 Other secondary cataract, bilateral: Secondary | ICD-10-CM | POA: Diagnosis not present

## 2020-10-19 ENCOUNTER — Ambulatory Visit: Payer: Medicare Other | Attending: Internal Medicine

## 2020-10-19 ENCOUNTER — Other Ambulatory Visit (HOSPITAL_BASED_OUTPATIENT_CLINIC_OR_DEPARTMENT_OTHER): Payer: Self-pay | Admitting: Internal Medicine

## 2020-10-19 DIAGNOSIS — Z23 Encounter for immunization: Secondary | ICD-10-CM

## 2020-10-19 MED FILL — PFIZER-BIONTECH COVID-19 VA: 30 | 1 days supply | Qty: 0 | Fill #0

## 2020-10-19 NOTE — Progress Notes (Signed)
° °  Covid-19 Vaccination Clinic  Name:  Jago Carton    MRN: [id number] DOB: 06/27/1924  10/19/2020  Mr. Armor was observed post Covid-19 immunization for 15 minutes without incident. He was provided with Vaccine Information Sheet and instruction to access the V-Safe system.   Mr. Mahar was instructed to call 911 with any severe reactions post vaccine:  Difficulty breathing   Swelling of face and throat   A fast heartbeat   A bad rash all over body   Dizziness and weakness   Immunizations Administered    Name Date Dose VIS Date [place] COVID-19 Vaccine 10/19/2020 10:00 AM 0.3 mL 09/16/2020 Intramuscular   Manufacturer: [place]   Lot: [id number]   [place]: [id number]     Vaccine given by Beckey Rutter, pharmacy student

## 2020-11-03 ENCOUNTER — Ambulatory Visit (INDEPENDENT_AMBULATORY_CARE_PROVIDER_SITE_OTHER): Payer: Medicare Other | Admitting: Family Medicine

## 2020-11-03 ENCOUNTER — Other Ambulatory Visit: Payer: Self-pay

## 2020-11-03 VITALS — BP 130/62 | HR 61 | Temp 97.7°F | Resp 15 | Wt 155.2 lb

## 2020-11-03 DIAGNOSIS — E7800 Pure hypercholesterolemia, unspecified: Secondary | ICD-10-CM | POA: Diagnosis not present

## 2020-11-03 DIAGNOSIS — R739 Hyperglycemia, unspecified: Secondary | ICD-10-CM

## 2020-11-03 DIAGNOSIS — I10 Essential (primary) hypertension: Secondary | ICD-10-CM

## 2020-11-03 DIAGNOSIS — K219 Gastro-esophageal reflux disease without esophagitis: Secondary | ICD-10-CM | POA: Diagnosis not present

## 2020-11-03 DIAGNOSIS — Z23 Encounter for immunization: Secondary | ICD-10-CM

## 2020-11-03 DIAGNOSIS — I255 Ischemic cardiomyopathy: Secondary | ICD-10-CM

## 2020-11-03 DIAGNOSIS — M109 Gout, unspecified: Secondary | ICD-10-CM

## 2020-11-03 DIAGNOSIS — F4321 Adjustment disorder with depressed mood: Secondary | ICD-10-CM | POA: Diagnosis not present

## 2020-11-03 DIAGNOSIS — F411 Generalized anxiety disorder: Secondary | ICD-10-CM | POA: Diagnosis not present

## 2020-11-03 LAB — LIPID PANEL
Cholesterol: 92 mg/dL (ref 0–200)
HDL: 38.7 mg/dL — ABNORMAL LOW (ref >39.00)
LDL Cholesterol: 37 mg/dL (ref 0–99)
NonHDL: 53.58
Total CHOL/HDL Ratio: 2
Triglycerides: 81 mg/dL (ref 0.0–149.0)
VLDL: 16.2 mg/dL (ref 0.0–40.0)

## 2020-11-03 LAB — CBC
HCT: 38.3 % — ABNORMAL LOW (ref 39.0–52.0)
Hemoglobin: 13 g/dL (ref 13.0–17.0)
MCHC: 34 g/dL (ref 30.0–36.0)
MCV: 98 fl (ref 78.0–100.0)
Platelets: 237 10*3/uL (ref 150.0–400.0)
RBC: 3.91 Mil/uL — ABNORMAL LOW (ref 4.22–5.81)
RDW: 14.3 % (ref 11.5–15.5)
WBC: 8.9 10*3/uL (ref 4.0–10.5)

## 2020-11-03 LAB — COMPREHENSIVE METABOLIC PANEL
ALT: 18 U/L (ref 0–53)
AST: 24 U/L (ref 0–37)
Albumin: 4.1 g/dL (ref 3.5–5.2)
Alkaline Phosphatase: 65 U/L (ref 39–117)
BUN: 30 mg/dL — ABNORMAL HIGH (ref 6–23)
CO2: 26 mEq/L (ref 19–32)
Calcium: 9.3 mg/dL (ref 8.4–10.5)
Chloride: 105 mEq/L (ref 96–112)
Creatinine, Ser: 1.1 mg/dL (ref 0.40–1.50)
GFR: 57.44 mL/min — ABNORMAL LOW (ref >60.00)
Glucose, Bld: 88 mg/dL (ref 70–99)
Potassium: 4.4 mEq/L (ref 3.5–5.1)
Sodium: 138 mEq/L (ref 135–145)
Total Bilirubin: 0.8 mg/dL (ref 0.2–1.2)
Total Protein: 6.4 g/dL (ref 6.0–8.3)

## 2020-11-03 LAB — TSH: TSH: 1.75 u[IU]/mL (ref 0.35–4.50)

## 2020-11-03 LAB — HEMOGLOBIN A1C: Hgb A1c MFr Bld: 5.9 % (ref 4.6–6.5)

## 2020-11-03 MED ORDER — FAMOTIDINE 20 MG PO TABS
20.0000 mg | ORAL_TABLET | Freq: Two times a day (BID) | ORAL | 1 refills | Status: DC
Start: 2020-11-03 — End: 2021-05-03

## 2020-11-03 MED ORDER — ISOSORBIDE MONONITRATE ER 30 MG PO TB24
90.0000 mg | ORAL_TABLET | Freq: Every day | ORAL | 1 refills | Status: DC
Start: 2020-11-03 — End: 2021-05-03

## 2020-11-03 NOTE — Assessment & Plan Note (Signed)
Well controlled, no changes to meds. Encouraged heart healthy diet such as the DASH diet and exercise as tolerated.  °

## 2020-11-03 NOTE — Assessment & Plan Note (Signed)
Avoid offending foods, start probiotics. Do not eat large meals in late evening and consider raising head of bed. Refill given on Famotidine today 

## 2020-11-03 NOTE — Patient Instructions (Signed)

## 2020-11-03 NOTE — Assessment & Plan Note (Signed)
hgba1c acceptable, minimize simple carbs. Increase exercise as tolerated.  

## 2020-11-03 NOTE — Assessment & Plan Note (Signed)
Hydrate and monitor, no recent flare 

## 2020-11-04 ENCOUNTER — Other Ambulatory Visit: Payer: Self-pay

## 2020-11-04 DIAGNOSIS — D539 Nutritional anemia, unspecified: Secondary | ICD-10-CM

## 2020-11-04 NOTE — Assessment & Plan Note (Signed)
Hydrate and monitor 

## 2020-11-04 NOTE — Progress Notes (Signed)
Subjective:    Patient ID: Daniel Reeves, male    DOB: November 26, 1924, 86 y.o.   MRN: [id number]  Chief Complaint  Patient presents with  . Follow-up    3 month- rectal bleed from gi hemroids    HPI Patient is in today for follow up on chronic medical concerns. No recent febrile illness or hospitalizations. He continues to work on his house to get it ready to sell. He intends to stay in the house as long as he can. He notes some sadness as he negotiates the loss of his wife but he is managing with the support of his family. Denies CP/palp/SOB/HA/congestion/fevers/GI or GU c/o. Taking meds as prescribed  Past Medical History:  Diagnosis Date  . Abdominal aortic aneurysm ([place])    a. [place] (1/14):  3.3 x 3.4 cm => f/u 11/2013  . Amebic dysentery   . AMEBIC DYSENTERY 11/19/2007   Qualifier: History of  By: Lenna Gilford MD, Deborra Medina   . Anemia 03/07/2017  . ANXIETY 11/19/2007   Qualifier: Diagnosis of  By: Lenna Gilford MD, Deborra Medina   . Arthritis 03/07/2017  . 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 BACK PAIN, LUMBAR 11/16/2007   Qualifier: Diagnosis of  By: Julien Girt CMA, Leigh    . Benign prostatic hypertrophy   . BENIGN PROSTATIC HYPERTROPHY, HX OF 11/16/2007   Qualifier: Diagnosis of  By: Julien Girt CMA, Leigh    . BRBPR (bright red blood per rectum) 11/01/2016  . CAD (coronary artery disease)    a. s/p CABG in 1979 and 1993;  b. LHC (5/14):  LM, LAD, CFX and RCA occluded; L-LAD ok, dLAD occluded after insertion of LIMA, S-OM occluded, S-PDA/AM 80-90 => PCI with Promus DES; EF 25%  . Cardiomyopathy, ischemic 06/05/2013  . Cerumen impaction    Bilateral  . Chicken pox as a child  . Chronic systolic CHF (congestive heart failure) ([place])   . COLONIC POLYPS 07/01/2008   Qualifier: Diagnosis of  By: Lenna Gilford MD, Deborra Medina   . Degenerative joint disease   . DEGENERATIVE JOINT DISEASE 11/16/2007   Qualifier: Diagnosis of  By: Julien Girt CMA, Marliss Czar    .  Diverticulosis of colon   . DIVERTICULOSIS OF COLON 07/01/2008   Qualifier: Diagnosis of  By: Lenna Gilford MD, Deborra Medina   . Double vision 03/07/2017  . Essential hypertension 11/16/2007   Qualifier: Diagnosis of  By: Julien Girt CMA, Marliss Czar    . Fingernail abnormalities 03/07/2017  . FLANK PAIN, RIGHT 02/03/2010   Qualifier: History of  By: Lenna Gilford MD, Deborra Medina   . GERD (gastroesophageal reflux disease)   . GOUT 11/16/2007   Qualifier: Diagnosis of  By: Julien Girt CMA, Marliss Czar    . Hearing loss 11/24/2014  . Heart murmur   . History of shingles 10/27/2017  . Hypercholesterolemia   . HYPERCHOLESTEROLEMIA 11/16/2007   Qualifier: Diagnosis of  By: Julien Girt CMA, Marliss Czar    . Ischemic cardiomyopathy    a. echo (09/05/13): EF 35%, diffuse HK worsened distal septal, mid/distal inferior and apical region, grade 1 diastolic dysfunction, mild LAE.    [name] Kidney stone 08/17/2011  . Loss of hearing   . Lumbar back pain   . Measles as a child  . Medicare annual wellness visit, subsequent 11/24/2014   Sees Dr Delman Cheadle for dermatology Sees Dr Roni Bread of Urology Sees Dr Stanford Breed of cardiology Sees Dr Virginia Rochester of Opthamology No further colonoscopies warranted       . Mumps  as a child  . Nephrolithiasis   . Pain in joint, lower leg 07/29/2014  . PERIPHERAL VASCULAR DISEASE 11/16/2007   Qualifier: Diagnosis of  By: Julien Girt CMA, Marliss Czar    . Peripheral vascular disease ([place])   . Rectal bleeding 03/07/2017  . Shingles 07/29/2014  . Sun-damaged skin 05/31/2014    Past Surgical History:  Procedure Laterality Date  . CORONARY ANGIOPLASTY WITH STENT PLACEMENT  04/18/2013   RCA       . CORONARY ARTERY BYPASS GRAFT  1979   x4 SVG-DIAG-LAD, SVG-OM-PDA  . CORONARY ARTERY BYPASS GRAFT  1993   Redo x5 by Dr Harlow Asa; [facility], SVG-OM, SVG-AM-PL  . Decompressive laminectomy  01/2006   L2 - scarum by Dr. Shellia Carwin  . HEMORRHOID SURGERY     fissure with hemorrhoid corrected at age 60  . INGUINAL HERNIA REPAIR  1994   Right by Dr Harlow Asa  . INGUINAL  HERNIA REPAIR  1996   Left by Dr. Harlow Asa  . LEFT HEART CATHETERIZATION WITH CORONARY ANGIOGRAM N/A 09/23/2013   Procedure: LEFT HEART CATHETERIZATION WITH CORONARY ANGIOGRAM;  Surgeon: Blane Ohara, MD;  Location: [facility] CATH LAB;  Service: Cardiovascular;  Laterality: N/A;  . lens implants     for vision correction  . PERCUTANEOUS CORONARY STENT INTERVENTION (PCI-S) N/A 04/18/2013   Procedure: PERCUTANEOUS CORONARY STENT INTERVENTION (PCI-S);  Surgeon: Sherren Mocha, MD;  Location: [facility] CATH LAB;  Service: Cardiovascular;  Laterality: N/A;  . TONSILLECTOMY      Family History  Problem Relation Age of Onset  . Parkinsonism Brother   . Diabetes Maternal Grandmother   . Depression Daughter   . Other Son        4 stents  . Heart disease Son   . Diabetes Son        type 2  . Colon cancer Neg Hx   . Esophageal cancer Neg Hx   . Rectal cancer Neg Hx   . Stomach cancer Neg Hx     Social History   Socioeconomic History  . Marital status: Widowed    Spouse name: [name] x 72 years  . Number of children: 7  . Years of education: Not on file  . Highest education level: Not on file  Occupational History  . 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 Smoking status: Former Smoker    Quit date: 11/28/1944    Years since quitting: 75.9  . Smokeless tobacco: Never Used  Vaping Use  . Vaping Use: Never used  Substance and Sexual Activity  . Alcohol use: Yes    Alcohol/week: 2.0 standard drinks    Types: 2 Standard drinks or equivalent per week    Comment: daily rum  or wine  . Drug use: No  . Sexual activity: Not Currently    Comment: lives with wife, no dietary restrictions.   Other Topics Concern  . Not on file  Social History Narrative   Married   7 children   Social Determinants of Health   Financial Resource Strain: Low Risk   . Difficulty of Paying Living Expenses: Not hard at all  Food Insecurity: No Food Insecurity  . Worried About Paediatric nurse in the Last Year: Never true  . Ran Out of Food in the Last Year: Never true  Transportation Needs: No Transportation Needs  . Lack of Transportation (Medical): No  . Lack of Transportation (Non-Medical): No  Physical Activity:   . Days  of Exercise per Week: Not on file  . Minutes of Exercise per Session: Not on file  Stress:   . Feeling of Stress : Not on file  Social Connections:   . Frequency of Communication with Friends and Family: Not on file  . Frequency of Social Gatherings with Friends and Family: Not on file  . Attends Religious Services: Not on file  . Active Member of Clubs or Organizations: Not on file  . Attends Archivist Meetings: Not on file  . Marital Status: Not on file  Intimate Partner Violence:   . Fear of Current or Ex-Partner: Not on file  . Emotionally Abused: Not on file  . Physically Abused: Not on file  . Sexually Abused: Not on file    Outpatient Medications Prior to Visit  Medication Sig Dispense Refill  . allopurinol (ZYLOPRIM) 300 MG tablet Take 1 tablet by mouth once daily 90 tablet 1  . AMBULATORY NON FORMULARY MEDICATION Place 30 g rectally 2 (two) times daily. Medication Name: Nitroglycerin 0.125% gel. Apply pea size amount to the rectum two times a day for 6-8 weeks Avoid exercise within 30 minutes of application. 30 g 1  . aspirin EC 81 MG tablet Take 81 mg by mouth every morning.     . B Complex-C (B-COMPLEX WITH VITAMIN C) tablet Take 1 tablet by mouth daily.     . Cholecalciferol (VITAMIN D-3 PO) Take 5,000 Units by mouth daily with breakfast.     . finasteride (PROSCAR) 5 MG tablet Takes every third day    . fluticasone (FLONASE) 50 MCG/ACT nasal spray Place 2 sprays into both nostrils daily as needed for allergies or rhinitis. 16 g 2  . folic acid (FOLVITE) 829 MCG tablet Take 400 mcg by mouth 2 (two) times daily.     [name] HYDROcodone-acetaminophen (NORCO/VICODIN) 5-325 MG tablet Take 0.5 tablets by mouth at bedtime.     [name] losartan (COZAAR) 50 MG tablet Take 1 tablet (50 mg total) by mouth daily. 90 tablet 2  . metoprolol succinate (TOPROL-XL) 25 MG 24 hr tablet Take 1/2 (one-half) tablet by mouth once daily 45 tablet 2  . Misc Natural Products (OSTEO BI-FLEX ADV JOINT SHIELD) TABS Take 1 tablet by mouth 2 (two) times daily.     . nitroGLYCERIN (NITROSTAT) 0.4 MG SL tablet DISSOLVE ONE TABLET UNDER THE TONGUE EVERY 5 MINUTES AS NEEDED FOR CHEST PAIN.  DO NOT EXCEED A TOTAL OF 3 DOSES IN 15 MINUTES 25 tablet 2  . simvastatin (ZOCOR) 40 MG tablet TAKE 1 TABLET BY MOUTH ONCE DAILY IN THE EVENING 90 tablet 1  . famotidine (PEPCID) 20 MG tablet Take 1 tablet by mouth twice daily 180 tablet 1  . isosorbide mononitrate (IMDUR) 30 MG 24 hr tablet Take 3 tablets by mouth once daily 270 tablet 1  . cephALEXin (KEFLEX) 500 MG capsule Take 1 capsule (500 mg total) by mouth 2 (two) times daily. (Patient not taking: Reported on 11/03/2020) 20 capsule 0   No facility-administered medications prior to visit.    Allergies  Allergen Reactions  . Lisinopril     REACTION: dizziness  . Methocarbamol     REACTION: pt states "dizzy"  . Other   . Pregabalin     REACTION: pt states "dizzy"  . Ramipril     REACTION: hives and dizziness    Review of Systems  Constitutional: Positive for malaise/fatigue. Negative for fever.  HENT: Negative for congestion.   Eyes: Negative for blurred  vision.  Respiratory: Negative for shortness of breath.   Cardiovascular: Negative for chest pain, palpitations and leg swelling.  Gastrointestinal: Negative for abdominal pain, blood in stool and nausea.  Genitourinary: Negative for dysuria and frequency.  Musculoskeletal: Negative for falls.  Skin: Negative for rash.  Neurological: Negative for dizziness, loss of consciousness and headaches.  Endo/Heme/Allergies: Negative for environmental allergies.  Psychiatric/Behavioral: Positive for depression. Negative for substance abuse and suicidal  ideas. The patient is not nervous/anxious.        Objective:    Physical Exam Vitals and nursing note reviewed.  Constitutional:      General: He is not in acute distress.    Appearance: He is well-developed.  HENT:     Head: Normocephalic and atraumatic.     Nose: Nose normal.  Eyes:     General:        Right eye: No discharge.        Left eye: No discharge.  Cardiovascular:     Rate and Rhythm: Normal rate and regular rhythm.     Heart sounds: No murmur heard.   Pulmonary:     Effort: Pulmonary effort is normal.     Breath sounds: Normal breath sounds.  Abdominal:     General: Bowel sounds are normal.     Palpations: Abdomen is soft.     Tenderness: There is no abdominal tenderness.  Musculoskeletal:     Cervical back: Normal range of motion and neck supple.  Skin:    General: Skin is warm and dry.  Neurological:     Mental Status: He is alert and oriented to person, place, and time.     BP 130/62 (BP Location: Left Arm, Patient Position: Sitting, Cuff Size: Normal)   Pulse 61   Temp 97.7 F (36.5 C) (Temporal)   Resp 15   Wt 155 lb 3.2 oz (70.4 kg)   SpO2 98%   BMI 23.60 kg/m  Wt Readings from Last 3 Encounters:  11/03/20 155 lb 3.2 oz (70.4 kg)  10/13/20 155 lb 9.6 oz (70.6 kg)  07/30/20 158 lb (71.7 kg)    Diabetic Foot Exam - Simple   No data filed     Lab Results  Component Value Date   WBC 8.9 11/03/2020   HGB 13.0 11/03/2020   HCT 38.3 (L) 11/03/2020   PLT 237.0 11/03/2020   GLUCOSE 88 11/03/2020   CHOL 92 11/03/2020   TRIG 81.0 11/03/2020   HDL 38.70 (L) 11/03/2020   LDLCALC 37 11/03/2020   ALT 18 11/03/2020   AST 24 11/03/2020   NA 138 11/03/2020   K 4.4 11/03/2020   CL 105 11/03/2020   CREATININE 1.10 11/03/2020   BUN 30 (H) 11/03/2020   CO2 26 11/03/2020   TSH 1.75 11/03/2020   PSA 25.19 (H) 04/03/2020   INR 1.09 02/04/2018   HGBA1C 5.9 11/03/2020    Lab Results  Component Value Date   TSH 1.75 11/03/2020   Lab Results   Component Value Date   WBC 8.9 11/03/2020   HGB 13.0 11/03/2020   HCT 38.3 (L) 11/03/2020   MCV 98.0 11/03/2020   PLT 237.0 11/03/2020   Lab Results  Component Value Date   NA 138 11/03/2020   K 4.4 11/03/2020   CO2 26 11/03/2020   GLUCOSE 88 11/03/2020   BUN 30 (H) 11/03/2020   CREATININE 1.10 11/03/2020   BILITOT 0.8 11/03/2020   ALKPHOS 65 11/03/2020   AST 24 11/03/2020   ALT  18 11/03/2020   PROT 6.4 11/03/2020   ALBUMIN 4.1 11/03/2020   CALCIUM 9.3 11/03/2020   ANIONGAP 7 11/30/2019   GFR 57.44 (L) 11/03/2020   Lab Results  Component Value Date   CHOL 92 11/03/2020   Lab Results  Component Value Date   HDL 38.70 (L) 11/03/2020   Lab Results  Component Value Date   LDLCALC 37 11/03/2020   Lab Results  Component Value Date   TRIG 81.0 11/03/2020   Lab Results  Component Value Date   CHOLHDL 2 11/03/2020   Lab Results  Component Value Date   HGBA1C 5.9 11/03/2020       Assessment & Plan:   Problem List Items Addressed This Visit    HYPERCHOLESTEROLEMIA - Primary   Relevant Medications   isosorbide mononitrate (IMDUR) 30 MG 24 hr tablet   Other Relevant Orders   Lipid panel (Completed)   Gout    Hydrate and monitor      Anxiety state    Hydrate and monitor, no recent flare.       Essential hypertension    Well controlled, no changes to meds. Encouraged heart healthy diet such as the DASH diet and exercise as tolerated.       Relevant Medications   isosorbide mononitrate (IMDUR) 30 MG 24 hr tablet   Other Relevant Orders   Comprehensive metabolic panel (Completed)   TSH (Completed)   GERD    Avoid offending foods, start probiotics. Do not eat large meals in late evening and consider raising head of bed. Refill given on Famotidine today      Relevant Medications   famotidine (PEPCID) 20 MG tablet   Other Relevant Orders   CBC (Completed)   Hyperglycemia    hgba1c acceptable, minimize simple carbs. Increase exercise as tolerated.        Relevant Orders   Hemoglobin A1c (Completed)   Grief reaction    He has good support from his children but misses his wife.        Other Visit Diagnoses    Needs flu shot       Relevant Orders   Flu Vaccine QUAD High Dose(Fluad) (Completed)      I have changed Tara F. Passmore "Dick"'s isosorbide mononitrate and famotidine. I am also having him maintain his folic acid, Osteo Bi-Flex Adv Joint Shield, Cholecalciferol (VITAMIN D-3 PO), aspirin EC, finasteride, B-complex with vitamin C, AMBULATORY NON FORMULARY MEDICATION, losartan, HYDROcodone-acetaminophen, fluticasone, metoprolol succinate, nitroGLYCERIN, simvastatin, allopurinol, and cephALEXin.  Meds ordered this encounter  Medications  . isosorbide mononitrate (IMDUR) 30 MG 24 hr tablet    Sig: Take 3 tablets (90 mg total) by mouth daily.    Dispense:  270 tablet    Refill:  1  . famotidine (PEPCID) 20 MG tablet    Sig: Take 1 tablet (20 mg total) by mouth 2 (two) times daily.    Dispense:  180 tablet    Refill:  1     Penni Homans, MD

## 2020-11-04 NOTE — Assessment & Plan Note (Signed)
He has good support from his children but misses his wife.

## 2020-12-01 ENCOUNTER — Other Ambulatory Visit: Payer: Self-pay | Admitting: Cardiology

## 2020-12-02 ENCOUNTER — Other Ambulatory Visit: Payer: Self-pay

## 2020-12-02 ENCOUNTER — Other Ambulatory Visit (INDEPENDENT_AMBULATORY_CARE_PROVIDER_SITE_OTHER): Payer: Medicare Other

## 2020-12-02 DIAGNOSIS — D539 Nutritional anemia, unspecified: Secondary | ICD-10-CM | POA: Diagnosis not present

## 2020-12-02 LAB — VITAMIN B12: Vitamin B-12: 183 pg/mL — ABNORMAL LOW (ref 211–911)

## 2020-12-02 LAB — CBC WITH DIFFERENTIAL/PLATELET
Basophils Absolute: 0 10*3/uL (ref 0.0–0.1)
Basophils Relative: 0.4 % (ref 0.0–3.0)
Eosinophils Absolute: 0.1 10*3/uL (ref 0.0–0.7)
Eosinophils Relative: 1.2 % (ref 0.0–5.0)
HCT: 36.5 % — ABNORMAL LOW (ref 39.0–52.0)
Hemoglobin: 12.3 g/dL — ABNORMAL LOW (ref 13.0–17.0)
Lymphocytes Relative: 17.8 % (ref 12.0–46.0)
Lymphs Abs: 1.4 10*3/uL (ref 0.7–4.0)
MCHC: 33.7 g/dL (ref 30.0–36.0)
MCV: 98.9 fl (ref 78.0–100.0)
Monocytes Absolute: 0.6 10*3/uL (ref 0.1–1.0)
Monocytes Relative: 7.8 % (ref 3.0–12.0)
Neutro Abs: 5.8 10*3/uL (ref 1.4–7.7)
Neutrophils Relative %: 72.8 % (ref 43.0–77.0)
Platelets: 220 10*3/uL (ref 150.0–400.0)
RBC: 3.69 Mil/uL — ABNORMAL LOW (ref 4.22–5.81)
RDW: 14.9 % (ref 11.5–15.5)
WBC: 7.9 10*3/uL (ref 4.0–10.5)

## 2020-12-03 ENCOUNTER — Telehealth: Payer: Self-pay

## 2020-12-03 ENCOUNTER — Other Ambulatory Visit: Payer: Self-pay

## 2020-12-03 NOTE — Telephone Encounter (Signed)
-----   Message from Blima Ledger, CMA sent at 12/03/2020 11:59 AM EST -----  ----- Message ----- From: Bradd Canary, MD Sent: 12/02/2020   3:28 PM EST To: Blima Ledger, CMA, #  So his Vitamin B 12 is now low. See if he is taking the OTC Vitamin B12 SL tab at 1000 mcg daily if not he should start that if he is taking it then we need to start him on shots. 1000 mcg shots [place] weekly x 8 weeks followed by biweekly x 4 weeks and then monthly ----- Message ----- From: Interface, Lab In [facility] Sent: 12/02/2020   1:08 PM EST To: Bradd Canary, MD

## 2020-12-03 NOTE — Telephone Encounter (Signed)
Attempted to call pt to go over lab results. LVM to call back 

## 2020-12-04 ENCOUNTER — Encounter: Payer: Self-pay | Admitting: *Deleted

## 2020-12-04 NOTE — Telephone Encounter (Signed)
Patient called back.  He was given his results yesterday but he wanted a copy.  Copy mailed to patient.

## 2020-12-07 ENCOUNTER — Telehealth: Payer: Self-pay

## 2020-12-07 NOTE — Telephone Encounter (Signed)
Tried calling pt  Phone line is busy call would not go thru

## 2020-12-07 NOTE — Telephone Encounter (Signed)
Caller reports his shoulder is injured and hurts and would like advice. Additional Comment Caller states he has very important business to attend to in the morning, but could be seen in the afternoon. Provided caller with office hours and advised to call back during business hours. Caller declined triage.  Telephone: [phone]

## 2020-12-08 ENCOUNTER — Ambulatory Visit (INDEPENDENT_AMBULATORY_CARE_PROVIDER_SITE_OTHER): Payer: Medicare Other | Admitting: Family

## 2020-12-08 ENCOUNTER — Other Ambulatory Visit: Payer: Self-pay

## 2020-12-08 ENCOUNTER — Ambulatory Visit (HOSPITAL_BASED_OUTPATIENT_CLINIC_OR_DEPARTMENT_OTHER)
Admission: RE | Admit: 2020-12-08 | Discharge: 2020-12-08 | Disposition: A | Discharge status: Home / Self Care | Payer: Medicare Other | Source: Ambulatory Visit | Attending: Family | Admitting: Family

## 2020-12-08 VITALS — BP 137/64 | HR 60 | Temp 98.0°F | Resp 14 | Wt 159.0 lb

## 2020-12-08 DIAGNOSIS — T148XXA Other injury of unspecified body region, initial encounter: Secondary | ICD-10-CM | POA: Diagnosis not present

## 2020-12-08 DIAGNOSIS — M79601 Pain in right arm: Secondary | ICD-10-CM

## 2020-12-08 DIAGNOSIS — M25511 Pain in right shoulder: Secondary | ICD-10-CM | POA: Diagnosis not present

## 2020-12-08 NOTE — Progress Notes (Signed)
Subjective:    Patient ID: Daniel Reeves, male    DOB: [birth year], 87 y.o.   MRN: [id number]  HPI  Patient is a 87 yr old male who presents today with chief complaint of R upper arm pain.  Pain began on Saturday 10/05/20.   State that he has been working around the house trying to get it ready to sell.  His wife is deceased and he has 7 grown children.  Reports that 1 week ago Saturday he recorded in his journal "hurt shoulder."  Can't remember the details of how he hurt his shoulder.  He has a dumpster bin that was half full of wet leaves.  He rolled this dumpster down the street to his curbside pile of leaves.  He attempted to dump the barrel over and "heard something crack."  He is not sure if he had any pain.  He reports a pain and swelling on the inside of the right arm.  "I feel a lump." He has been using tylenol scheduled with some improvement.    Review of Systems See HPI  Past Medical History:  Diagnosis Date  . Abdominal aortic aneurysm ([place])    a. [place] (1/14):  3.3 x 3.4 cm => f/u 11/2013  . Amebic dysentery   . AMEBIC DYSENTERY 11/19/2007   Qualifier: History of  By: Lenna Gilford MD, Deborra Medina   . Anemia 03/07/2017  . ANXIETY 11/19/2007   Qualifier: Diagnosis of  By: Lenna Gilford MD, Deborra Medina   . Arthritis 03/07/2017  . 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 BACK PAIN, LUMBAR 11/16/2007   Qualifier: Diagnosis of  By: Julien Girt CMA, Leigh    . Benign prostatic hypertrophy   . BENIGN PROSTATIC HYPERTROPHY, HX OF 11/16/2007   Qualifier: Diagnosis of  By: Julien Girt CMA, Leigh    . BRBPR (bright red blood per rectum) 11/01/2016  . CAD (coronary artery disease)    a. s/p CABG in 1979 and 1993;  b. LHC (5/14):  LM, LAD, CFX and RCA occluded; L-LAD ok, dLAD occluded after insertion of LIMA, S-OM occluded, S-PDA/AM 80-90 => PCI with Promus DES; EF 25%  . Cardiomyopathy, ischemic 06/05/2013  . Cerumen impaction    Bilateral  . Chicken pox as a  child  . Chronic systolic CHF (congestive heart failure) ([place])   . COLONIC POLYPS 07/01/2008   Qualifier: Diagnosis of  By: Lenna Gilford MD, Deborra Medina   . Degenerative joint disease   . DEGENERATIVE JOINT DISEASE 11/16/2007   Qualifier: Diagnosis of  By: Julien Girt CMA, Marliss Czar    . Diverticulosis of colon   . DIVERTICULOSIS OF COLON 07/01/2008   Qualifier: Diagnosis of  By: Lenna Gilford MD, Deborra Medina   . Double vision 03/07/2017  . Essential hypertension 11/16/2007   Qualifier: Diagnosis of  By: Julien Girt CMA, Marliss Czar    . Fingernail abnormalities 03/07/2017  . FLANK PAIN, RIGHT 02/03/2010   Qualifier: History of  By: Lenna Gilford MD, Deborra Medina   . GERD (gastroesophageal reflux disease)   . GOUT 11/16/2007   Qualifier: Diagnosis of  By: Julien Girt CMA, Marliss Czar    . Hearing loss 11/24/2014  . Heart murmur   . History of shingles 10/27/2017  . Hypercholesterolemia   . HYPERCHOLESTEROLEMIA 11/16/2007   Qualifier: Diagnosis of  By: Julien Girt CMA, Marliss Czar    . Ischemic cardiomyopathy    a. echo (09/05/13): EF 35%, diffuse HK worsened distal septal, mid/distal inferior and apical region, grade 1 diastolic dysfunction,  mild LAE.    [name]. Kidney stone 08/17/2011  . Loss of hearing   . Lumbar back pain   . Measles as a child  . Medicare annual wellness visit, subsequent 11/24/2014   Sees Dr Emily FilbertGould for dermatology Sees Dr Wilson SingerWren of Urology Sees Dr Jens Somrenshaw of cardiology Sees Dr Alecia LemmingPulpa of Opthamology No further colonoscopies warranted       . Mumps as a child  . Nephrolithiasis   . Pain in joint, lower leg 07/29/2014  . PERIPHERAL VASCULAR DISEASE 11/16/2007   Qualifier: Diagnosis of  By: Renaldo FiddlerAdkins CMA, Marliss CzarLeigh    . Peripheral vascular disease ([facility])   . Rectal bleeding 03/07/2017  . Shingles 07/29/2014  . Sun-damaged skin 05/31/2014     Social History   Socioeconomic History  . Marital status: Widowed    Spouse name: Magda Paganiniudrey x 72 years  . Number of children: 7  . Years of education: Not on file  . Highest education level: Not on file  Occupational  History  . 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 Smoking status: Former Smoker    Quit date: 11/28/1944    Years since quitting: 76.0  . Smokeless tobacco: Never Used  Vaping Use  . Vaping Use: Never used  Substance and Sexual Activity  . Alcohol use: Yes    Alcohol/week: 2.0 standard drinks    Types: 2 Standard drinks or equivalent per week    Comment: daily rum  or wine  . Drug use: No  . Sexual activity: Not Currently    Comment: lives with wife, no dietary restrictions.   Other Topics Concern  . Not on file  Social History Narrative   Married   7 children   Social Determinants of Health   Financial Resource Strain: Low Risk   . Difficulty of Paying Living Expenses: Not hard at all  Food Insecurity: No Food Insecurity  . Worried About Programme researcher, broadcasting/film/videounning Out of Food in the Last Year: Never true  . Ran Out of Food in the Last Year: Never true  Transportation Needs: No Transportation Needs  . Lack of Transportation (Medical): No  . Lack of Transportation (Non-Medical): No  Physical Activity: Not on file  Stress: Not on file  Social Connections: Not on file  Intimate Partner Violence: Not on file    Past Surgical History:  Procedure Laterality Date  . CORONARY ANGIOPLASTY WITH STENT PLACEMENT  04/18/2013   RCA       . CORONARY ARTERY BYPASS GRAFT  1979   x4 SVG-DIAG-LAD, SVG-OM-PDA  . CORONARY ARTERY BYPASS GRAFT  1993   Redo x5 by Dr Gerrit FriendsGerkin; [facility]-LAD-DIAG, SVG-OM, SVG-AM-PL  . Decompressive laminectomy  01/2006   L2 - scarum by Dr. Simonne ComeAplington  . HEMORRHOID SURGERY     fissure with hemorrhoid corrected at age 87  . INGUINAL HERNIA REPAIR  1994   Right by Dr Gerrit FriendsGerkin  . INGUINAL HERNIA REPAIR  1996   Left by Dr. Gerrit FriendsGerkin  . LEFT HEART CATHETERIZATION WITH CORONARY ANGIOGRAM N/A 09/23/2013   Procedure: LEFT HEART CATHETERIZATION WITH CORONARY ANGIOGRAM;  Surgeon: Micheline ChapmanMichael D Cooper, MD;  [facility];  Service: Cardiovascular;  Laterality:  N/A;  . lens implants     for vision correction  . PERCUTANEOUS CORONARY STENT INTERVENTION (PCI-S) N/A 04/18/2013   Procedure: PERCUTANEOUS CORONARY STENT INTERVENTION (PCI-S);  Surgeon: Tonny BollmanMichael Cooper, MD;  [facility];  Service: Cardiovascular;  Laterality: N/A;  . TONSILLECTOMY  Family History  Problem Relation Age of Onset  . Parkinsonism Brother   . Diabetes Maternal Grandmother   . Depression Daughter   . Other Son        4 stents  . Heart disease Son   . Diabetes Son        type 2  . Colon cancer Neg Hx   . Esophageal cancer Neg Hx   . Rectal cancer Neg Hx   . Stomach cancer Neg Hx     Allergies  Allergen Reactions  . Lisinopril     REACTION: dizziness  . Methocarbamol     REACTION: pt states "dizzy"  . Other   . Pregabalin     REACTION: pt states "dizzy"  . Ramipril     REACTION: hives and dizziness    Current Outpatient Medications on File Prior to Visit  Medication Sig Dispense Refill  . allopurinol (ZYLOPRIM) 300 MG tablet Take 1 tablet by mouth once daily 90 tablet 1  . AMBULATORY NON FORMULARY MEDICATION Place 30 g rectally 2 (two) times daily. Medication Name: Nitroglycerin 0.125% gel. Apply pea size amount to the rectum two times a day for 6-8 weeks Avoid exercise within 30 minutes of application. 30 g 1  . aspirin EC 81 MG tablet Take 81 mg by mouth every morning.     . B Complex-C (B-COMPLEX WITH VITAMIN C) tablet Take 1 tablet by mouth daily.     . cephALEXin (KEFLEX) 500 MG capsule Take 1 capsule (500 mg total) by mouth 2 (two) times daily. 20 capsule 0  . Cholecalciferol (VITAMIN D-3 PO) Take 5,000 Units by mouth daily with breakfast.    . famotidine (PEPCID) 20 MG tablet Take 1 tablet (20 mg total) by mouth 2 (two) times daily. 180 tablet 1  . finasteride (PROSCAR) 5 MG tablet Takes every third day    . folic acid (FOLVITE) 025 MCG tablet Take 400 mcg by mouth 2 (two) times daily.    . isosorbide mononitrate (IMDUR) 30 MG 24 hr  tablet Take 3 tablets (90 mg total) by mouth daily. 270 tablet 1  . losartan (COZAAR) 50 MG tablet Take 1 tablet by mouth once daily 90 tablet 0  . metoprolol succinate (TOPROL-XL) 25 MG 24 hr tablet Take 1/2 (one-half) tablet by mouth once daily 45 tablet 2  . Misc Natural Products (OSTEO BI-FLEX ADV JOINT SHIELD) TABS Take 1 tablet by mouth 2 (two) times daily.    . nitroGLYCERIN (NITROSTAT) 0.4 MG SL tablet DISSOLVE ONE TABLET UNDER THE TONGUE EVERY 5 MINUTES AS NEEDED FOR CHEST PAIN.  DO NOT EXCEED A TOTAL OF 3 DOSES IN 15 MINUTES 25 tablet 2  . simvastatin (ZOCOR) 40 MG tablet TAKE 1 TABLET BY MOUTH ONCE DAILY IN THE EVENING 90 tablet 1   No current facility-administered medications on file prior to visit.    BP 137/64 (BP Location: Left Arm, Patient Position: Sitting, Cuff Size: Normal)   Pulse 60   Temp 98 F (36.7 C) (Oral)   Resp 14   Wt 159 lb (72.1 kg)   SpO2 100%   BMI 24.18 kg/m       Objective:   Physical Exam Constitutional:      General: He is not in acute distress.    Appearance: He is well-developed and well-nourished.  HENT:     Head: Normocephalic and atraumatic.  Pulmonary:     Effort: Pulmonary effort is normal.  Musculoskeletal:  General: No edema.     Comments: Full ROM of the right shoulder and elbow without significant pain.   Skin:    General: Skin is warm and dry.          Comments: + hematoma with surrounding ecchymosis of right inner arm- mild associated soft tissue tenderness.   Neurological:     Mental Status: He is alert and oriented to person, place, and time.  Psychiatric:        Mood and Affect: Mood and affect normal.        Behavior: Behavior normal.        Thought Content: Thought content normal.           Assessment & Plan:  Hematoma/RUE pain- he is really worried about possibility of fracture.  I clinically doubt this, but will obtain x-ray of the right shoulder and humerus to further evaluate. Recommended tylenol as  needed for pain. He is advised to call if increased pain/swelling/redness.  This visit occurred during the SARS-CoV-2 public health emergency.  Safety protocols were in place, including screening questions prior to the visit, additional usage of staff PPE, and extensive cleaning of exam room while observing appropriate contact time as indicated for disinfecting solutions.

## 2020-12-08 NOTE — Patient Instructions (Signed)
Please continue tylenol as needed. Please complete x-rays on the first floor. Call if increased pain/swelling.

## 2020-12-08 NOTE — Telephone Encounter (Signed)
Appt today w/ Melissa.  

## 2020-12-17 ENCOUNTER — Telehealth: Payer: Self-pay | Admitting: Family Medicine

## 2020-12-17 NOTE — Telephone Encounter (Signed)
Left message on machine to call back  

## 2020-12-17 NOTE — Telephone Encounter (Signed)
Patient would like the doctor report form his x ray on 1/11/20222 stating that nothing was wrong with his right arm

## 2020-12-17 NOTE — Telephone Encounter (Signed)
Advised patient of results and sent a copy.

## 2020-12-20 ENCOUNTER — Other Ambulatory Visit: Payer: Self-pay | Admitting: Cardiology

## 2021-01-12 ENCOUNTER — Other Ambulatory Visit: Payer: Self-pay | Admitting: Family Medicine

## 2021-01-12 ENCOUNTER — Telehealth: Payer: Self-pay | Admitting: Family Medicine

## 2021-01-12 DIAGNOSIS — R197 Diarrhea, unspecified: Secondary | ICD-10-CM

## 2021-01-12 NOTE — Telephone Encounter (Signed)
He needs to be seen, we need vitals and labs. I have ordered him a cbc, cmp, sed rate but he also needs an ifob and an appt for evaluation, if he ca do the labs as quick as possible we can make sure this is not an emergency then he has to be seen by someone and decision made about next steps.

## 2021-01-12 NOTE — Telephone Encounter (Signed)
The Nurse Line called stating that Daniel Reeves refused to go to ED for bloody diarrhea.

## 2021-01-12 NOTE — Telephone Encounter (Signed)
Nurse Assessment Nurse: Micki Riley, RN, Domenick Gong Date/Time (Eastern Time): 01/12/2021 3:08:00 PM Confirm and document reason for call. If symptomatic, describe symptoms. ---Caller states he has been having loose bms/diarrhea, with blood & possible mucus. Denies fever or any other symptoms. Unsure if able to take pepto bismol. States label advises not to take "if you have gout, take baby ASA, have bleeding issues"- which he says he has all of those- advised that based on that warming & his condition, not to take pepto. Does the patient have any new or worsening symptoms? ---Yes Will a triage be completed? ---Yes Related visit to physician within the last 2 weeks? ---N/A Does the PT have any chronic conditions? (i.e. diabetes, asthma, this includes High risk factors for pregnancy, etc.) ---Unknown Is this a behavioral health or substance abuse call? ---No Guidelines Guideline Title Affirmed Question Affirmed Notes Nurse Date/Time Eilene Ghazi Time) Rectal Bleeding High-risk adult (e.g., prior surgery on aorta, abdominal aortic aneurysm) Cazares, RN, Sanjuanita Janie 01/12/2021 3:15:55 PM Disp. Time Eilene Ghazi Time) Disposition Final User 01/12/2021 3:24:35 PM Go to ED Now (or PCP triage) Yes Cazares, RN, Domenick Gong PLEASE NOTE: All timestamps contained within this report are represented as [place] Standard Time. CONFIDENTIALTY NOTICE: This fax transmission is intended only for the addressee. It contains information that is legally privileged, confidential or otherwise protected from use or disclosure. If you are not the intended recipient, you are strictly prohibited from reviewing, disclosing, copying using or disseminating any of this information or taking any action in reliance on or regarding this information. If you have received this fax in error, please notify us immediately by telephone so that we can arrange for its return to [place]. Phone: [phone], Toll-Free: [phone],  Fax: [phone] Page: 2 of 2 Call Id: [id number] [facility] Disagree/Comply Comply Caller Understands Yes PreDisposition InappropriateToAsk Care Advice Given Per Guideline GO TO ED NOW (OR PCP TRIAGE): CARE ADVICE given per Rectal Bleeding (Adult) guideline. Comments User: Domenick Gong, Micki Riley, RN Date/Time Eilene Ghazi Time): 01/12/2021 3:31:12 PM Attempted to call office backline, unable to get through. Called office mainline: provided all information, staff states nurse Rosario Adie will call patient back. - - Per client directives: If patient refuses 911/ED/UC disposition and wants to be seen or speak to the office instead: 1) Tell caller you will relay message to the office and should expect a call back shortly. 2) Disconnect with the caller. 3) Call the backline line number and provide patient's name, DOB, reason for calling, outcome, and call back number. Referrals GO TO FACILITY REFUSED REFERRED TO PCP OFFICE

## 2021-01-13 ENCOUNTER — Other Ambulatory Visit: Payer: Self-pay | Admitting: *Deleted

## 2021-01-13 ENCOUNTER — Other Ambulatory Visit: Payer: Self-pay

## 2021-01-13 ENCOUNTER — Other Ambulatory Visit (INDEPENDENT_AMBULATORY_CARE_PROVIDER_SITE_OTHER): Payer: Medicare Other

## 2021-01-13 DIAGNOSIS — R197 Diarrhea, unspecified: Secondary | ICD-10-CM

## 2021-01-13 LAB — CBC WITH DIFFERENTIAL/PLATELET
Basophils Absolute: 0 10*3/uL (ref 0.0–0.1)
Basophils Relative: 0.4 % (ref 0.0–3.0)
Eosinophils Absolute: 0.1 10*3/uL (ref 0.0–0.7)
Eosinophils Relative: 1.3 % (ref 0.0–5.0)
HCT: 35.8 % — ABNORMAL LOW (ref 39.0–52.0)
Hemoglobin: 12.4 g/dL — ABNORMAL LOW (ref 13.0–17.0)
Lymphocytes Relative: 17.5 % (ref 12.0–46.0)
Lymphs Abs: 1.4 10*3/uL (ref 0.7–4.0)
MCHC: 34.6 g/dL (ref 30.0–36.0)
MCV: 100 fl (ref 78.0–100.0)
Monocytes Absolute: 0.6 10*3/uL (ref 0.1–1.0)
Monocytes Relative: 7.1 % (ref 3.0–12.0)
Neutro Abs: 5.9 10*3/uL (ref 1.4–7.7)
Neutrophils Relative %: 73.7 % (ref 43.0–77.0)
Platelets: 223 10*3/uL (ref 150.0–400.0)
RBC: 3.58 Mil/uL — ABNORMAL LOW (ref 4.22–5.81)
RDW: 15 % (ref 11.5–15.5)
WBC: 8.1 10*3/uL (ref 4.0–10.5)

## 2021-01-13 LAB — COMPREHENSIVE METABOLIC PANEL
ALT: 19 U/L (ref 0–53)
AST: 22 U/L (ref 0–37)
Albumin: 3.8 g/dL (ref 3.5–5.2)
Alkaline Phosphatase: 66 U/L (ref 39–117)
BUN: 22 mg/dL (ref 6–23)
CO2: 24 mEq/L (ref 19–32)
Calcium: 9.1 mg/dL (ref 8.4–10.5)
Chloride: 106 mEq/L (ref 96–112)
Creatinine, Ser: 1.05 mg/dL (ref 0.40–1.50)
GFR: 60.65 mL/min (ref >60.00)
Glucose, Bld: 97 mg/dL (ref 70–99)
Potassium: 4.5 mEq/L (ref 3.5–5.1)
Sodium: 137 mEq/L (ref 135–145)
Total Bilirubin: 0.9 mg/dL (ref 0.2–1.2)
Total Protein: 6.2 g/dL (ref 6.0–8.3)

## 2021-01-13 LAB — SEDIMENTATION RATE: Sed Rate: 3 mm/hr (ref 0–20)

## 2021-01-13 NOTE — Telephone Encounter (Signed)
Patient does not think he needs to be seen.  Advised him at least do the blood work and if abnormal that he must be seen.  Patient will be in today for blood draw and pickup ifob

## 2021-01-15 ENCOUNTER — Telehealth: Payer: Self-pay

## 2021-01-15 ENCOUNTER — Other Ambulatory Visit (INDEPENDENT_AMBULATORY_CARE_PROVIDER_SITE_OTHER): Payer: Medicare Other

## 2021-01-15 ENCOUNTER — Other Ambulatory Visit: Payer: Self-pay | Admitting: Family Medicine

## 2021-01-15 DIAGNOSIS — D649 Anemia, unspecified: Secondary | ICD-10-CM

## 2021-01-15 DIAGNOSIS — R197 Diarrhea, unspecified: Secondary | ICD-10-CM | POA: Diagnosis not present

## 2021-01-15 DIAGNOSIS — R195 Other fecal abnormalities: Secondary | ICD-10-CM

## 2021-01-15 LAB — FECAL OCCULT BLOOD, IMMUNOCHEMICAL: Fecal Occult Bld: POSITIVE — AB

## 2021-01-15 NOTE — Telephone Encounter (Signed)
See result note.  

## 2021-01-15 NOTE — Telephone Encounter (Signed)
Positive Ifob  Caller: Hope  Receiver: Daniel Reeves  Date and Time: 01/15/2021 @ 9:21 am

## 2021-01-28 NOTE — Progress Notes (Signed)
HPI: FU CAD; s/p CABG in 1979 and 1993, ischemic CM, systolic CHF, AAA, HTN, HL. Patient underwent cardiac catheterization in May of 2014. The left main, LAD, circumflex and RCA were occluded. The LIMA to the LAD was patent and the distal LAD was occluded after the insertion. Saphenous vein graft to the obtuse marginal was occluded. Saphenous vein graft to the acute marginal and PDA had a high-grade lesion prior to insertion into the PDA of 80-90%. Ejection fraction was 25%. PCI: Promus Premier (3.5x12 mm) DES to the [facility].Repeat catheterization in October 2014 because of recurrent chest pain. The stent placed in the saphenous vein graft to the PDA had mild in-stent restenosis. Medical therapy recommended. Nuclear study 2/17 showed EF 35, inferolateral scar, no ischemia.Last echocardiogram February 2018 showed ejection fraction 31-54%, grade 1 diastolic dysfunction, mild to moderate aortic insufficiency, mild mitral regurgitation. Carotid Dopplers February 2018 showed less than 50% bilateral stenosis.Abdominal ultrasound9/21showed 4cm abdominal aortic aneurysm. Since he was last seen,patient denies dyspnea, chest pain, palpitations or syncope.  Mild pedal edema.  He recently started iron is complaining of black stool.  However he also notes some hematochezia.  Current Outpatient Medications  Medication Sig Dispense Refill   allopurinol (ZYLOPRIM) 300 MG tablet Take 1 tablet by mouth once daily 90 tablet 1   aspirin EC 81 MG tablet Take 81 mg by mouth every morning.      Cholecalciferol (VITAMIN D-3 PO) Take 5,000 Units by mouth daily with breakfast.     Cyanocobalamin ([employer] B-12) 500 MCG SUBL Place under the tongue.     famotidine (PEPCID) 20 MG tablet Take 1 tablet (20 mg total) by mouth 2 (two) times daily. 180 tablet 1   Ferrous Fumarate-Folic Acid (HEMOCYTE-F) 324-1 MG TABS Take 1 tablet by mouth daily. 30 tablet 3   finasteride (PROSCAR) 5 MG tablet Takes every third day      folic acid (FOLVITE) 008 MCG tablet Take 400 mcg by mouth 2 (two) times daily.     hydrocortisone (ANUSOL-HC) 2.5 % rectal cream Place 1 application rectally 2 (two) times daily. 30 g 0   isosorbide mononitrate (IMDUR) 30 MG 24 hr tablet Take 3 tablets (90 mg total) by mouth daily. 270 tablet 1   losartan (COZAAR) 50 MG tablet Take 1 tablet by mouth once daily 90 tablet 0   metoprolol succinate (TOPROL-XL) 25 MG 24 hr tablet Take 1/2 (one-half) tablet by mouth once daily 45 tablet 0   Misc Natural Products (OSTEO BI-FLEX ADV JOINT SHIELD) TABS Take 1 tablet by mouth 2 (two) times daily.     Multiple Vitamin (MULTIVITAMIN) tablet Take 1 tablet by mouth daily.     nitroGLYCERIN (NITROSTAT) 0.4 MG SL tablet DISSOLVE ONE TABLET UNDER THE TONGUE EVERY 5 MINUTES AS NEEDED FOR CHEST PAIN.  DO NOT EXCEED A TOTAL OF 3 DOSES IN 15 MINUTES 25 tablet 2   simvastatin (ZOCOR) 40 MG tablet TAKE 1 TABLET BY MOUTH ONCE DAILY IN THE EVENING 90 tablet 1   No current facility-administered medications for this visit.     Past Medical History:  Diagnosis Date   Abdominal aortic aneurysm ([place])    a. [place] (1/14):  3.3 x 3.4 cm => f/u 11/2013   Amebic dysentery    AMEBIC DYSENTERY 11/19/2007   Qualifier: History of  By: Lenna Gilford MD, Deborra Medina    Anemia 03/07/2017   ANXIETY 11/19/2007   Qualifier: Diagnosis of  By: Lenna Gilford MD, Deborra Medina  Arthritis 03/07/2017   Atherosclerosis of coronary artery bypass graft with unstable angina pectoris ([place]) 04/19/2013   BACK PAIN, LUMBAR 11/16/2007   Qualifier: Diagnosis of  By: Julien Girt CMA, Leigh     Benign prostatic hypertrophy    BENIGN PROSTATIC HYPERTROPHY, HX OF 11/16/2007   Qualifier: Diagnosis of  By: Julien Girt CMA, Leigh     BRBPR (bright red blood per rectum) 11/01/2016   CAD (coronary artery disease)    a. s/p CABG in 1979 and 1993;  b. LHC (5/14):  LM, LAD, CFX and RCA occluded; L-LAD ok, dLAD occluded after insertion of LIMA, S-OM occluded, S-PDA/AM 80-90 =>  PCI with Promus DES; EF 25%   Cardiomyopathy, ischemic 06/05/2013   Cerumen impaction    Bilateral   Chicken pox as a child   Chronic systolic CHF (congestive heart failure) ([place])    COLONIC POLYPS 07/01/2008   Qualifier: Diagnosis of  By: Lenna Gilford MD, Scott M    Degenerative joint disease    DEGENERATIVE JOINT DISEASE 11/16/2007   Qualifier: Diagnosis of  By: Julien Girt CMA, Leigh     Diverticulosis of colon    DIVERTICULOSIS OF COLON 07/01/2008   Qualifier: Diagnosis of  By: Lenna Gilford MD, Deborra Medina    Double vision 03/07/2017   Essential hypertension 11/16/2007   Qualifier: Diagnosis of  By: Julien Girt CMA, Leigh     Fingernail abnormalities 03/07/2017   FLANK PAIN, RIGHT 02/03/2010   Qualifier: History of  By: Lenna Gilford MD, Deborra Medina    GERD (gastroesophageal reflux disease)    GOUT 11/16/2007   Qualifier: Diagnosis of  By: Julien Girt CMA, Leigh     Hearing loss 11/24/2014   Heart murmur    History of shingles 10/27/2017   Hypercholesterolemia    HYPERCHOLESTEROLEMIA 11/16/2007   Qualifier: Diagnosis of  By: Julien Girt CMA, Leigh     Ischemic cardiomyopathy    a. echo (09/05/13): EF 35%, diffuse HK worsened distal septal, mid/distal inferior and apical region, grade 1 diastolic dysfunction, mild LAE.     Kidney stone 08/17/2011   Loss of hearing    Lumbar back pain    Measles as a child   Medicare annual wellness visit, subsequent 11/24/2014   Sees Dr Delman Cheadle for dermatology Sees Dr Roni Bread of Urology Sees Dr Stanford Breed of cardiology Sees Dr Virginia Rochester of Opthamology No further colonoscopies warranted        Mumps as a child   Nephrolithiasis    Pain in joint, lower leg 07/29/2014   PERIPHERAL VASCULAR DISEASE 11/16/2007   Qualifier: Diagnosis of  By: Julien Girt CMA, Leigh     Peripheral vascular disease ([place])    Rectal bleeding 03/07/2017   Shingles 07/29/2014   Sun-damaged skin 05/31/2014    Past Surgical History:  Procedure Laterality Date   CORONARY ANGIOPLASTY WITH STENT PLACEMENT   04/18/2013   RCA        CORONARY ARTERY BYPASS GRAFT  1979   x4 SVG-DIAG-LAD, SVG-OM-PDA   CORONARY ARTERY BYPASS GRAFT  1993   Redo x5 by Dr Harlow Asa; Durward Fortes, SVG-OM, SVG-AM-PL   Decompressive laminectomy  01/2006   L2 - scarum by Dr. Shellia Carwin   HEMORRHOID SURGERY     fissure with hemorrhoid corrected at age 89   [place]   Right by Dr Lonzo Cloud HERNIA REPAIR  1996   Left by Dr. Harlow Asa   LEFT HEART CATHETERIZATION WITH CORONARY ANGIOGRAM N/A 09/23/2013   Procedure: [place];  Surgeon: Legrand Como  Ree Kida, MD;  [facility];  Service: Cardiovascular;  Laterality: N/A;   lens implants     for vision correction   PERCUTANEOUS CORONARY STENT INTERVENTION (PCI-S) N/A 04/18/2013   Procedure: PERCUTANEOUS CORONARY STENT INTERVENTION (PCI-S);  Surgeon: Sherren Mocha, MD;  Location: [facility] CATH LAB;  Service: Cardiovascular;  Laterality: N/A;   TONSILLECTOMY      Social History   Socioeconomic History   Marital status: Widowed    Spouse name: [name] x 72 years   Number of children: 7   Years of education: Not on file   Highest education level: Not on file  Occupational History   Occupation: Retired - Former Editor, 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   Other Topics Concern   Not on file  Social History Narrative   Married   7 children   Social Determinants of Health   Financial Resource Strain: Low Risk    Difficulty of Paying Living Expenses: Not hard at  all  Food Insecurity: No Food Insecurity   Worried About Charity fundraiser in the Last Year: Never true   Arboriculturist in the Last Year: Never true  Transportation Needs: No Transportation Needs   Lack of Transportation (Medical): No   Lack of Transportation (Non-Medical): No  Physical Activity: Not on file  Stress: Not on file  Social Connections: Not on file  Intimate Partner Violence: Not on file    Family History  Problem Relation Age of Onset   Parkinsonism Brother    Diabetes Maternal Grandmother    Depression Daughter    Other Son        4 stents   Heart disease Son    Diabetes Son        type 2   Colon cancer Neg Hx    Esophageal cancer Neg Hx    Rectal cancer Neg Hx    Stomach cancer Neg Hx     ROS: no fevers or chills, productive cough, hemoptysis, dysphasia, odynophagia, dysuria, hematuria, rash, seizure activity, orthopnea, PND, claudication. Remaining systems are negative.  Physical Exam: Well-developed well-nourished in no acute distress.  Skin is warm and dry.  HEENT is normal.  Neck is supple.  Chest is clear to auscultation with normal expansion.  Cardiovascular exam is regular rate and rhythm.  Abdominal exam nontender or distended. No masses palpated. Extremities show trace edema. neuro grossly intact  A/P  1 CAD-continue ASA and statin.  2 hypertension-BP controlled; continue present meds.  3 hyperlipidemia-continue statin.  4 AAA-schedule fu abdominal ultrasound September 2022.  5 ischemic CM-continue beta blocker; continue ARB.  Repeat echocardiogram.  6 black stool/hematochezia-likely black stool is from iron.  However he also describes some hematochezia.  Repeat hemoglobin.  Follow-up primary care.  Kirk Ruths, MD

## 2021-01-31 ENCOUNTER — Other Ambulatory Visit: Payer: Self-pay | Admitting: Cardiology

## 2021-02-02 ENCOUNTER — Other Ambulatory Visit: Payer: Self-pay

## 2021-02-02 ENCOUNTER — Ambulatory Visit (INDEPENDENT_AMBULATORY_CARE_PROVIDER_SITE_OTHER): Payer: Medicare Other | Admitting: Family Medicine

## 2021-02-02 VITALS — BP 100/60 | HR 58 | Temp 97.6°F | Ht 72.0 in | Wt 162.0 lb

## 2021-02-02 DIAGNOSIS — I059 Rheumatic mitral valve disease, unspecified: Secondary | ICD-10-CM

## 2021-02-02 DIAGNOSIS — I10 Essential (primary) hypertension: Secondary | ICD-10-CM | POA: Diagnosis not present

## 2021-02-02 DIAGNOSIS — M109 Gout, unspecified: Secondary | ICD-10-CM | POA: Diagnosis not present

## 2021-02-02 DIAGNOSIS — E7800 Pure hypercholesterolemia, unspecified: Secondary | ICD-10-CM | POA: Diagnosis not present

## 2021-02-02 DIAGNOSIS — E538 Deficiency of other specified B group vitamins: Secondary | ICD-10-CM

## 2021-02-02 DIAGNOSIS — D649 Anemia, unspecified: Secondary | ICD-10-CM | POA: Diagnosis not present

## 2021-02-02 DIAGNOSIS — R739 Hyperglycemia, unspecified: Secondary | ICD-10-CM

## 2021-02-02 DIAGNOSIS — I2510 Atherosclerotic heart disease of native coronary artery without angina pectoris: Secondary | ICD-10-CM

## 2021-02-02 DIAGNOSIS — I499 Cardiac arrhythmia, unspecified: Secondary | ICD-10-CM | POA: Diagnosis not present

## 2021-02-02 DIAGNOSIS — I2583 Coronary atherosclerosis due to lipid rich plaque: Secondary | ICD-10-CM | POA: Diagnosis not present

## 2021-02-02 LAB — LIPID PANEL
Cholesterol: 81 mg/dL (ref 0–200)
HDL: 39.8 mg/dL (ref >39.00)
LDL Cholesterol: 22 mg/dL (ref 0–99)
NonHDL: 40.88
Total CHOL/HDL Ratio: 2
Triglycerides: 94 mg/dL (ref 0.0–149.0)
VLDL: 18.8 mg/dL (ref 0.0–40.0)

## 2021-02-02 LAB — CBC WITH DIFFERENTIAL/PLATELET
Basophils Absolute: 0 10*3/uL (ref 0.0–0.1)
Basophils Relative: 0.5 % (ref 0.0–3.0)
Eosinophils Absolute: 0.1 10*3/uL (ref 0.0–0.7)
Eosinophils Relative: 1.1 % (ref 0.0–5.0)
HCT: 36.4 % — ABNORMAL LOW (ref 39.0–52.0)
Hemoglobin: 12.3 g/dL — ABNORMAL LOW (ref 13.0–17.0)
Lymphocytes Relative: 18.9 % (ref 12.0–46.0)
Lymphs Abs: 1.5 10*3/uL (ref 0.7–4.0)
MCHC: 33.7 g/dL (ref 30.0–36.0)
MCV: 99.5 fl (ref 78.0–100.0)
Monocytes Absolute: 0.6 10*3/uL (ref 0.1–1.0)
Monocytes Relative: 7.6 % (ref 3.0–12.0)
Neutro Abs: 5.9 10*3/uL (ref 1.4–7.7)
Neutrophils Relative %: 71.9 % (ref 43.0–77.0)
Platelets: 192 10*3/uL (ref 150.0–400.0)
RBC: 3.65 Mil/uL — ABNORMAL LOW (ref 4.22–5.81)
RDW: 13.9 % (ref 11.5–15.5)
WBC: 8.2 10*3/uL (ref 4.0–10.5)

## 2021-02-02 LAB — COMPREHENSIVE METABOLIC PANEL
ALT: 16 U/L (ref 0–53)
AST: 25 U/L (ref 0–37)
Albumin: 4.1 g/dL (ref 3.5–5.2)
Alkaline Phosphatase: 64 U/L (ref 39–117)
BUN: 27 mg/dL — ABNORMAL HIGH (ref 6–23)
CO2: 24 mEq/L (ref 19–32)
Calcium: 9.3 mg/dL (ref 8.4–10.5)
Chloride: 105 mEq/L (ref 96–112)
Creatinine, Ser: 1.05 mg/dL (ref 0.40–1.50)
GFR: 60.63 mL/min (ref >60.00)
Glucose, Bld: 92 mg/dL (ref 70–99)
Potassium: 4.2 mEq/L (ref 3.5–5.1)
Sodium: 137 mEq/L (ref 135–145)
Total Bilirubin: 1 mg/dL (ref 0.2–1.2)
Total Protein: 6.5 g/dL (ref 6.0–8.3)

## 2021-02-02 LAB — URIC ACID: Uric Acid, Serum: 3.1 mg/dL — ABNORMAL LOW (ref 4.0–7.8)

## 2021-02-02 LAB — HEMOGLOBIN A1C: Hgb A1c MFr Bld: 5.6 % (ref 4.6–6.5)

## 2021-02-02 LAB — VITAMIN B12: Vitamin B-12: 539 pg/mL (ref 211–911)

## 2021-02-02 LAB — TSH: TSH: 1.93 u[IU]/mL (ref 0.35–4.50)

## 2021-02-02 NOTE — Patient Instructions (Signed)
[employer] medicine (25th ed., pp. [phone]). [place], [place]: Elsevier.">  Anemia  Anemia is a condition in which there is not enough red blood cells or hemoglobin in the blood. Hemoglobin is a substance in red blood cells that carries oxygen. When you do not have enough red blood cells or hemoglobin (are anemic), your body cannot get enough oxygen and your organs may not work properly. As a result, you may feel very tired or have other problems. What are the causes? Common causes of anemia include:  Excessive bleeding. Anemia can be caused by excessive bleeding inside or outside the body, including bleeding from the intestines or from heavy menstrual periods in females.  Poor nutrition.  Long-lasting (chronic) kidney, thyroid, and liver disease.  Bone marrow disorders, spleen problems, and blood disorders.  Cancer and treatments for cancer.  HIV (human immunodeficiency virus) and AIDS (acquired immunodeficiency syndrome).  Infections, medicines, and autoimmune disorders that destroy red blood cells. What are the signs or symptoms? Symptoms of this condition include:  Minor weakness.  Dizziness.  Headache, or difficulties concentrating and sleeping.  Heartbeats that feel irregular or faster than normal (palpitations).  Shortness of breath, especially with exercise.  Pale skin, lips, and nails, or cold hands and feet.  Indigestion and nausea. Symptoms may occur suddenly or develop slowly. If your anemia is mild, you may not have symptoms. How is this diagnosed? This condition is diagnosed based on blood tests, your medical history, and a physical exam. In some cases, a test may be needed in which cells are removed from the soft tissue inside of a bone and looked at under a microscope (bone marrow biopsy). Your health care provider may also check your stool (feces) for blood and may do additional testing to look for the cause of your bleeding. Other tests may  include:  Imaging tests, such as a CT scan or MRI.  A procedure to see inside your esophagus and stomach (endoscopy).  A procedure to see inside your colon and rectum (colonoscopy). How is this treated? Treatment for this condition depends on the cause. If you continue to lose a lot of blood, you may need to be treated at a hospital. Treatment may include:  Taking supplements of iron, vitamin Q68, or folic acid.  Taking a hormone medicine (erythropoietin) that can help to stimulate red blood cell growth.  Having a blood transfusion. This may be needed if you lose a lot of blood.  Making changes to your diet.  Having surgery to remove your spleen. Follow these instructions at home:  Take over-the-counter and prescription medicines only as told by your health care provider.  Take supplements only as told by your health care provider.  Follow any diet instructions that you were given by your health care provider.  Keep all follow-up visits as told by your health care provider. This is important. Contact a health care provider if:  You develop new bleeding anywhere in the body. Get help right away if:  You are very weak.  You are short of breath.  You have pain in your abdomen or chest.  You are dizzy or feel faint.  You have trouble concentrating.  You have bloody stools, black stools, or tarry stools.  You vomit repeatedly or you vomit up blood. These symptoms may represent a serious problem that is an emergency. Do not wait to see if the symptoms will go away. Get medical help right away. Call your local emergency services (911 in the [place] Do not  drive yourself to the hospital. Summary  Anemia is a condition in which you do not have enough red blood cells or enough of a substance in your red blood cells that carries oxygen (hemoglobin).  Symptoms may occur suddenly or develop slowly.  If your anemia is mild, you may not have symptoms.  This condition is  diagnosed with blood tests, a medical history, and a physical exam. Other tests may be needed.  Treatment for this condition depends on the cause of the anemia. This information is not intended to replace advice given to you by your health care provider. Make sure you discuss any questions you have with your health care provider. Document Revised: 10/22/2019 Document Reviewed: 10/22/2019 Elsevier Patient Education  2021 [employer].  

## 2021-02-02 NOTE — Progress Notes (Signed)
Patient ID: Daniel Reeves, male    DOB: 06-20-1924  Age: 87 y.o. MRN: [id number]    Subjective:  Subjective  HPI Daniel Reeves presents for office visit today. He reports having good days and bad days psychologically. 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   He notes overindulging in food yesterday, and after he developed diarrhea this morning. He had less than 20 episodes. He reports that this is different than his baseline. He always has frequent episodes of diarrhea with blood in the stool, however it has increased this week.  He complains of interminent right foot swelling. He reports that it is more swollen during the morning and resolves at night. He denies any bilateral feet pain or erythema. He denies any palpitations, chest pain, abdominal pain, fever, chills, cough, SOB, HA, N/V, or testicular pain, weight changes, or ear pain.   Review of Systems  Constitutional: Negative for appetite change, chills, diaphoresis, fatigue and fever.  HENT: Negative for congestion, ear discharge, ear pain, rhinorrhea, sinus pressure, sinus pain and sore throat.   Eyes: Negative for pain.  Respiratory: Negative for cough, chest tightness and shortness of breath.   Cardiovascular: Negative for chest pain and palpitations.  Gastrointestinal: Positive for blood in stool and diarrhea. Negative for abdominal pain, constipation, nausea and vomiting.  Genitourinary: Negative for difficulty urinating, dysuria, flank pain, hematuria, penile pain and testicular pain.  Musculoskeletal: Negative for back pain and neck pain.       Right foot swelling  Skin: Negative for rash.  Neurological: Negative for headaches.    History Past Medical History:  Diagnosis Date  . Abdominal aortic aneurysm ([place])    a. [place] (1/14):  3.3 x 3.4 cm => f/u 11/2013  . Amebic dysentery   . AMEBIC DYSENTERY  11/19/2007   Qualifier: History of  By: Lenna Gilford MD, Deborra Medina   . Anemia 03/07/2017  . ANXIETY 11/19/2007   Qualifier: Diagnosis of  By: Lenna Gilford MD, Deborra Medina   . Arthritis 03/07/2017  . 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 BACK PAIN, LUMBAR 11/16/2007   Qualifier: Diagnosis of  By: Julien Girt CMA, Leigh    . Benign prostatic hypertrophy   . BENIGN PROSTATIC HYPERTROPHY, HX OF 11/16/2007   Qualifier: Diagnosis of  By: Julien Girt CMA, Leigh    . BRBPR (bright red blood per rectum) 11/01/2016  . CAD (coronary artery disease)    a. s/p CABG in 1979 and 1993;  b. LHC (5/14):  LM, LAD, CFX and RCA occluded; L-LAD ok, dLAD occluded after insertion of LIMA, S-OM occluded, S-PDA/AM 80-90 => PCI with Promus DES; EF 25%  . Cardiomyopathy, ischemic 06/05/2013  . Cerumen impaction    Bilateral  . Chicken pox as a child  . Chronic systolic CHF (congestive heart failure) ([place])   . COLONIC POLYPS 07/01/2008   Qualifier: Diagnosis of  By: Lenna Gilford MD, Deborra Medina   . Degenerative joint disease   . DEGENERATIVE JOINT DISEASE 11/16/2007   Qualifier: Diagnosis of  By: Julien Girt CMA, Marliss Czar    . Diverticulosis of colon   . DIVERTICULOSIS OF COLON 07/01/2008   Qualifier: Diagnosis of  By: Lenna Gilford MD, Deborra Medina   . Double vision 03/07/2017  . Essential hypertension 11/16/2007   Qualifier: Diagnosis of  By: Julien Girt CMA, Marliss Czar    . Fingernail abnormalities 03/07/2017  .  FLANK PAIN, RIGHT 02/03/2010   Qualifier: History of  By: Lenna Gilford MD, Deborra Medina   . GERD (gastroesophageal reflux disease)   . GOUT 11/16/2007   Qualifier: Diagnosis of  By: Julien Girt CMA, Marliss Czar    . Hearing loss 11/24/2014  . Heart murmur   . History of shingles 10/27/2017  . Hypercholesterolemia   . HYPERCHOLESTEROLEMIA 11/16/2007   Qualifier: Diagnosis of  By: Julien Girt CMA, Marliss Czar    . Ischemic cardiomyopathy    a. echo (09/05/13): EF 35%, diffuse HK worsened distal septal, mid/distal inferior and apical region, grade 1  diastolic dysfunction, mild LAE.    [name] Kidney stone 08/17/2011  . Loss of hearing   . Lumbar back pain   . Measles as a child  . Medicare annual wellness visit, subsequent 11/24/2014   Sees Dr Delman Cheadle for dermatology Sees Dr Roni Bread of Urology Sees Dr Stanford Breed of cardiology Sees Dr Virginia Rochester of Opthamology No further colonoscopies warranted       . Mumps as a child  . Nephrolithiasis   . Pain in joint, lower leg 07/29/2014  . PERIPHERAL VASCULAR DISEASE 11/16/2007   Qualifier: Diagnosis of  By: Julien Girt CMA, Marliss Czar    . Peripheral vascular disease ([place])   . Rectal bleeding 03/07/2017  . Shingles 07/29/2014  . Sun-damaged skin 05/31/2014    He has a past surgical history that includes Inguinal hernia repair (1994); Inguinal hernia repair (1996); Decompressive laminectomy (01/2006); Coronary angioplasty with stent (04/18/2013); Hemorrhoid surgery; Tonsillectomy; percutaneous coronary stent intervention (pci-s) (N/A, 04/18/2013); left heart catheterization with coronary angiogram (N/A, 09/23/2013); Coronary artery bypass graft (1979); Coronary artery bypass graft (1993); and lens implants.   His family history includes Depression in his daughter; Diabetes in his maternal grandmother and son; Heart disease in his son; Other in his son; Parkinsonism in his brother.He reports that he quit smoking about 76 years ago. He has never used smokeless tobacco. He reports current alcohol use of about 2.0 standard drinks of alcohol per week. He reports that he does not use drugs.  Current Outpatient Medications on File Prior to Visit  Medication Sig Dispense Refill  . allopurinol (ZYLOPRIM) 300 MG tablet Take 1 tablet by mouth once daily 90 tablet 1  . AMBULATORY NON FORMULARY MEDICATION Place 30 g rectally 2 (two) times daily. Medication Name: Nitroglycerin 0.125% gel. Apply pea size amount to the rectum two times a day for 6-8 weeks Avoid exercise within 30 minutes of application. 30 g 1  . aspirin EC 81 MG tablet Take 81 mg  by mouth every morning.     . Cholecalciferol (VITAMIN D-3 PO) Take 5,000 Units by mouth daily with breakfast.    . Cyanocobalamin ([employer]S B-12) 500 MCG SUBL Place under the tongue.    . famotidine (PEPCID) 20 MG tablet Take 1 tablet (20 mg total) by mouth 2 (two) times daily. 180 tablet 1  . finasteride (PROSCAR) 5 MG tablet Takes every third day    . folic acid (FOLVITE) 979 MCG tablet Take 400 mcg by mouth 2 (two) times daily.    . isosorbide mononitrate (IMDUR) 30 MG 24 hr tablet Take 3 tablets (90 mg total) by mouth daily. 270 tablet 1  . losartan (COZAAR) 50 MG tablet Take 1 tablet by mouth once daily 90 tablet 0  . metoprolol succinate (TOPROL-XL) 25 MG 24 hr tablet Take 1/2 (one-half) tablet by mouth once daily 45 tablet 0  . Misc Natural Products (OSTEO BI-FLEX ADV JOINT SHIELD) TABS Take 1 tablet  by mouth 2 (two) times daily.    . Multiple Vitamin (MULTIVITAMIN) tablet Take 1 tablet by mouth daily.    . nitroGLYCERIN (NITROSTAT) 0.4 MG SL tablet DISSOLVE ONE TABLET UNDER THE TONGUE EVERY 5 MINUTES AS NEEDED FOR CHEST PAIN.  DO NOT EXCEED A TOTAL OF 3 DOSES IN 15 MINUTES 25 tablet 2  . simvastatin (ZOCOR) 40 MG tablet TAKE 1 TABLET BY MOUTH ONCE DAILY IN THE EVENING 90 tablet 1   No current facility-administered medications on file prior to visit.     Objective:  Objective  Physical Exam Constitutional:      General: He is not in acute distress.    Appearance: He is well-developed and well-nourished. He is not diaphoretic.  HENT:     Head: Normocephalic and atraumatic.     Right Ear: External ear normal.     Left Ear: External ear normal.     Nose: Nose normal.     Mouth/Throat:     Mouth: Oropharynx is clear and moist.     Pharynx: No oropharyngeal exudate.  Eyes:     General:        Right eye: No discharge.        Left eye: No discharge.     Extraocular Movements: EOM normal.     Pupils: Pupils are equal, round, and reactive to light.     Comments: Bilateral conjunctivae  is pink instead of grey  Neck:     Thyroid: No thyromegaly.     Vascular: No JVD.  Cardiovascular:     Rate and Rhythm: Normal rate. Rhythm irregular.     Pulses: Normal pulses and intact distal pulses.          Posterior tibial pulses are 2+ on the right side and 2+ on the left side.     Heart sounds: Murmur heard.   Systolic murmur is present with a grade of 2/6.   Pulmonary:     Effort: Pulmonary effort is normal. No respiratory distress.     Breath sounds: Normal breath sounds. No wheezing or rales.  Chest:     Chest wall: No tenderness.  Abdominal:     General: Bowel sounds are normal. There is no distension.     Palpations: Abdomen is soft. There is no mass.     Tenderness: There is no abdominal tenderness. There is no guarding or rebound.  Genitourinary:    Penis: Normal.      Prostate: Normal.  Musculoskeletal:        General: No tenderness or edema. Normal range of motion.     Cervical back: Normal range of motion and neck supple.     Right lower leg: No edema.     Left lower leg: No edema.     Right ankle: Normal. No swelling.     Left ankle: Normal. No swelling.  Lymphadenopathy:     Cervical: No cervical adenopathy.  Skin:    General: Skin is warm and dry.     Findings: No erythema or rash.  Neurological:     Mental Status: He is alert and oriented to person, place, and time.     Cranial Nerves: No cranial nerve deficit.     Motor: No abnormal muscle tone.     Deep Tendon Reflexes: Reflexes are normal and symmetric. Reflexes normal.  Psychiatric:        Mood and Affect: Mood and affect normal.        Behavior: Behavior normal.  Thought Content: Thought content normal.        Judgment: Judgment normal.    BP 100/60   Pulse (!) 58   Temp 97.6 F (36.4 C)   Ht 6' (1.829 m)   Wt 162 lb (73.5 kg)   SpO2 98%   BMI 21.97 kg/m  Wt Readings from Last 3 Encounters:  02/02/21 162 lb (73.5 kg)  12/08/20 159 lb (72.1 kg)  11/03/20 155 lb 3.2 oz (70.4  kg)     Lab Results  Component Value Date   WBC 8.2 02/02/2021   HGB 12.3 (L) 02/02/2021   HCT 36.4 (L) 02/02/2021   PLT 192.0 02/02/2021   GLUCOSE 92 02/02/2021   CHOL 81 02/02/2021   TRIG 94.0 02/02/2021   HDL 39.80 02/02/2021   LDLCALC 22 02/02/2021   ALT 16 02/02/2021   AST 25 02/02/2021   NA 137 02/02/2021   K 4.2 02/02/2021   CL 105 02/02/2021   CREATININE 1.05 02/02/2021   BUN 27 (H) 02/02/2021   CO2 24 02/02/2021   TSH 1.93 02/02/2021   PSA 25.19 (H) 04/03/2020   INR 1.09 02/04/2018   HGBA1C 5.6 02/02/2021    DG Shoulder Right  Result Date: 12/08/2020 CLINICAL DATA:  Right shoulder pain. EXAM: RIGHT SHOULDER - 2+ VIEW COMPARISON:  Chest x-ray 11/30/2019. FINDINGS: Prior CABG. Atelectatic changes right upper lung. Evidence of fracture, dislocation, or separation. Old right posterior third and sixth rib fractures. IMPRESSION: 1.  Prior CABG.  Atelectatic changes right upper lung. 2. No evidence of acute fracture, dislocation, or separation. Right shoulder is intact. Electronically Signed   By: Marcello Moores  Register   On: 12/08/2020 16:51   DG Humerus Right  Result Date: 12/08/2020 CLINICAL DATA:  Right shoulder pain. EXAM: RIGHT HUMERUS - 2+ VIEW COMPARISON:  Right shoulder 12/08/2010 and chest radiograph 11/30/2019 FINDINGS: Right humerus is intact. Negative for fracture. Right shoulder appears to be located on these images. This probably an old fracture involving the right sixth rib. IMPRESSION: No acute abnormality to the right humerus. Electronically Signed   By: Markus Daft M.D.   On: 12/08/2020 16:54     Assessment & Plan:  Plan    No orders of the defined types were placed in this encounter.   Problem List Items Addressed This Visit    HYPERCHOLESTEROLEMIA - Primary    Encouraged heart healthy diet, increase exercise, avoid trans fats, consider a krill oil cap daily. Tolerating Simvastatin.       Relevant Orders   Lipid panel (Completed)   Gout   Relevant  Orders   Uric acid (Completed)   Essential hypertension    Well controlled, no changes to meds. Encouraged heart healthy diet such as the DASH diet and exercise as tolerated.       Relevant Orders   CBC with Differential/Platelet (Completed)   Comprehensive metabolic panel (Completed)   TSH (Completed)   CAD (coronary artery disease)    His EKG shows frequent ectopic ventricular beats, RBBB and previous infarct. He has appt with cardiology next week and is asymptomatic. Have reviewed with cardiology and he will proceed with next appt and seek care if symptoms develop      Anemia    He continues to see small amounts of blood at the end of most bowel movements and his iron stores while still normal have dropped. Will start him on Hemocyte F daily and he has a follow up appointment with gastroenterology later this week.  Relevant Medications   Cyanocobalamin (CVS B-12) 500 MCG SUBL   Other Relevant Orders   Iron, TIBC and Ferritin Panel (Completed)   Hyperglycemia    hgba1c acceptable, minimize simple carbs. Increase exercise as tolerated.       Relevant Orders   Hemoglobin A1c (Completed)    Other Visit Diagnoses    Mitral valve disease       Relevant Orders   ECHOCARDIOGRAM COMPLETE   Vitamin B12 deficiency       Relevant Orders   Vitamin B12 (Completed)   Intrinsic Factor Antibodies   Irregular heart beat       Relevant Orders   EKG 12-Lead (Completed)      Follow-up: Return in about 3 months (around 05/05/2021).   I,Alexis Bryant,acting as a Education administrator for Penni Homans, MD.,have documented all relevant documentation on the behalf of Penni Homans, MD,as directed by  Penni Homans, MD while in the presence of Penni Homans, MD.

## 2021-02-02 NOTE — Assessment & Plan Note (Signed)
Well controlled, no changes to meds. Encouraged heart healthy diet such as the DASH diet and exercise as tolerated.  °

## 2021-02-03 ENCOUNTER — Encounter: Payer: Self-pay | Admitting: *Deleted

## 2021-02-03 ENCOUNTER — Other Ambulatory Visit: Payer: Self-pay | Admitting: *Deleted

## 2021-02-03 MED ORDER — HEMOCYTE-F 324-1 MG PO TABS: 1.0000 | ORAL_TABLET | Freq: Every day | ORAL | 3 refills | Status: DC

## 2021-02-03 NOTE — Assessment & Plan Note (Signed)
He continues to see small amounts of blood at the end of most bowel movements and his iron stores while still normal have dropped. Will start him on Hemocyte F daily and he has a follow up appointment with gastroenterology later this week.

## 2021-02-03 NOTE — Assessment & Plan Note (Signed)
His EKG shows frequent ectopic ventricular beats, RBBB and previous infarct. He has appt with cardiology next week and is asymptomatic. Have reviewed with cardiology and he will proceed with next appt and seek care if symptoms develop

## 2021-02-03 NOTE — Assessment & Plan Note (Signed)
Encouraged heart healthy diet, increase exercise, avoid trans fats, consider a krill oil cap daily. Tolerating Simvastatin.

## 2021-02-03 NOTE — Assessment & Plan Note (Signed)
hgba1c acceptable, minimize simple carbs. Increase exercise as tolerated.  

## 2021-02-04 ENCOUNTER — Ambulatory Visit (INDEPENDENT_AMBULATORY_CARE_PROVIDER_SITE_OTHER): Payer: Medicare Other | Admitting: Gastroenterology

## 2021-02-04 ENCOUNTER — Encounter: Payer: Self-pay | Admitting: Gastroenterology

## 2021-02-04 VITALS — BP 130/62 | HR 50 | Ht 72.0 in | Wt 162.1 lb

## 2021-02-04 DIAGNOSIS — K641 Second degree hemorrhoids: Secondary | ICD-10-CM

## 2021-02-04 DIAGNOSIS — K921 Melena: Secondary | ICD-10-CM

## 2021-02-04 DIAGNOSIS — R151 Fecal smearing: Secondary | ICD-10-CM | POA: Diagnosis not present

## 2021-02-04 DIAGNOSIS — R195 Other fecal abnormalities: Secondary | ICD-10-CM | POA: Diagnosis not present

## 2021-02-04 LAB — IRON,TIBC AND FERRITIN PANEL
%SAT: 21 % (calc) (ref 20–48)
Ferritin: 104 ng/mL (ref 24–380)
Iron: 56 ug/dL (ref 50–180)
TIBC: 267 mcg/dL (calc) (ref 250–425)

## 2021-02-04 LAB — INTRINSIC FACTOR ANTIBODIES: Intrinsic Factor: POSITIVE — AB

## 2021-02-04 MED ORDER — HYDROCORTISONE (PERIANAL) 2.5 % EX CREA: 1.0000 "application " | TOPICAL_CREAM | Freq: Two times a day (BID) | CUTANEOUS | 0 refills | Status: DC

## 2021-02-04 NOTE — Progress Notes (Signed)
P  Chief Complaint:    FOBT positive stool, hematochezia  GI History: 87 year old male history of CAD/CABG 1979/9093, hyperlipidemia, HTN, BPH, initially seen in the GI clinic on 12/18/2019 for evaluation of hematochezia.  Rectal exam n/f internal hemorrhoids and possible internal anal fissure.  History of anal fissure surgery in his 40s.  Flexible sigmoidoscopy 12/2019 as below.  Started on topical NTG x6 weeks  Endoscopic history: -Flexible sigmoidoscopy (12/2019, Dr. Bryan Lemma): Small internal anal fissure, 8 mm tubular adenoma in sigmoid, sigmoid diverticulosis, localized area of mild inflammation at 20-23 cm from anal verge, located in area of dense diverticulosis (path: Benign), Medium-sized internal hemorrhoids.  Granular mucosa at anal verge (path: Necrotic debris without AIN) -Colonoscopy (02/2010, Dr. Ardis Hughs): Moderate to severe left-sided diverticulosis with peridiverticular edema, tortuosity of the colon.  Normal retroflexion. -Colonoscopy (2007, Dr. Velora Heckler): Benign polyp, repeat in 4 years  HPI:     Patient is a 87 y.o. male presenting to the Gastroenterology Clinic for evaluation of FOBT+ stools.  Starts with firm stool 1st thing in the AM (0800), which progressively gets looser through the AM until about 10:00, with BRB on tissue paper with those last few stools. Has been like this for many years. No dyschezia. Has had episodes of fecal seepage.    Not sure if any of the symptoms improved with topical NTG in 2021.   Labs in 12/2020 reviewed: FOBT positive, normal ESR, CMP.  Stable mild anemia with H/H 12.4/39.8  Labs earlier this month with low normal iron and stable anemia, started on Hemocyte 1 tab daily by Dr. Randel Pigg.  Otherwise normal CMP, A1c, TSH, B12 (improved from 2 months earlier).    Review of systems:     No chest pain, no SOB, no fevers, no urinary sx   Past Medical History:  Diagnosis Date  . Abdominal aortic aneurysm ([place])    a. [place] (1/14):  3.3 x 3.4 cm  => f/u 11/2013  . Amebic dysentery   . AMEBIC DYSENTERY 11/19/2007   Qualifier: History of  By: Lenna Gilford MD, Deborra Medina   . Anemia 03/07/2017  . ANXIETY 11/19/2007   Qualifier: Diagnosis of  By: Lenna Gilford MD, Deborra Medina   . Arthritis 03/07/2017  . Atherosclerosis of coronary artery bypass graft with unstable angina pectoris ([place]) 04/19/2013  . BACK PAIN, LUMBAR 11/16/2007   Qualifier: Diagnosis of  By: Julien Girt CMA, Leigh    . Benign prostatic hypertrophy   . BENIGN PROSTATIC HYPERTROPHY, HX OF 11/16/2007   Qualifier: Diagnosis of  By: Julien Girt CMA, Leigh    . BRBPR (bright red blood per rectum) 11/01/2016  . CAD (coronary artery disease)    a. s/p CABG in 1979 and 1993;  b. LHC (5/14):  LM, LAD, CFX and RCA occluded; L-LAD ok, dLAD occluded after insertion of LIMA, S-OM occluded, S-PDA/AM 80-90 => PCI with Promus DES; EF 25%  . Cardiomyopathy, ischemic 06/05/2013  . Cerumen impaction    Bilateral  . Chicken pox as a child  . Chronic systolic CHF (congestive heart failure) ([place])   . COLONIC POLYPS 07/01/2008   Qualifier: Diagnosis of  By: Lenna Gilford MD, Deborra Medina   . Degenerative joint disease   . DEGENERATIVE JOINT DISEASE 11/16/2007   Qualifier: Diagnosis of  By: Julien Girt CMA, Marliss Czar    . Diverticulosis of colon   . DIVERTICULOSIS OF COLON 07/01/2008   Qualifier: Diagnosis of  By: Lenna Gilford MD, Deborra Medina   . Double vision 03/07/2017  . Essential hypertension 11/16/2007  Qualifier: Diagnosis of  By: Julien Girt CMA, Leigh    . Fingernail abnormalities 03/07/2017  . FLANK PAIN, RIGHT 02/03/2010   Qualifier: History of  By: Lenna Gilford MD, Deborra Medina   . GERD (gastroesophageal reflux disease)   . GOUT 11/16/2007   Qualifier: Diagnosis of  By: Julien Girt CMA, Marliss Czar    . Hearing loss 11/24/2014  . Heart murmur   . History of shingles 10/27/2017  . Hypercholesterolemia   . HYPERCHOLESTEROLEMIA 11/16/2007   Qualifier: Diagnosis of  By: Julien Girt CMA, Marliss Czar    . Ischemic cardiomyopathy    a. echo (09/05/13): EF 35%, diffuse HK worsened distal  septal, mid/distal inferior and apical region, grade 1 diastolic dysfunction, mild LAE.    [name] Kidney stone 08/17/2011  . Loss of hearing   . Lumbar back pain   . Measles as a child  . Medicare annual wellness visit, subsequent 11/24/2014   Sees Dr Delman Cheadle for dermatology Sees Dr Roni Bread of Urology Sees Dr Stanford Breed of cardiology Sees Dr Virginia Rochester of Opthamology No further colonoscopies warranted       . Mumps as a child  . Nephrolithiasis   . Pain in joint, lower leg 07/29/2014  . PERIPHERAL VASCULAR DISEASE 11/16/2007   Qualifier: Diagnosis of  By: Julien Girt CMA, Marliss Czar    . Peripheral vascular disease ([place])   . Rectal bleeding 03/07/2017  . Shingles 07/29/2014  . Sun-damaged skin 05/31/2014    Patient's surgical history, family medical history, social history, medications and allergies were all reviewed in Epic    Current Outpatient Medications  Medication Sig Dispense Refill  . allopurinol (ZYLOPRIM) 300 MG tablet Take 1 tablet by mouth once daily 90 tablet 1  . aspirin EC 81 MG tablet Take 81 mg by mouth every morning.     . Cholecalciferol (VITAMIN D-3 PO) Take 5,000 Units by mouth daily with breakfast.    . Cyanocobalamin ([employer]S B-12) 500 MCG SUBL Place under the tongue.    . famotidine (PEPCID) 20 MG tablet Take 1 tablet (20 mg total) by mouth 2 (two) times daily. 180 tablet 1  . Ferrous Fumarate-Folic Acid (HEMOCYTE-F) 324-1 MG TABS Take 1 tablet by mouth daily. 30 tablet 3  . finasteride (PROSCAR) 5 MG tablet Takes every third day    . folic acid (FOLVITE) 353 MCG tablet Take 400 mcg by mouth 2 (two) times daily.    . isosorbide mononitrate (IMDUR) 30 MG 24 hr tablet Take 3 tablets (90 mg total) by mouth daily. 270 tablet 1  . losartan (COZAAR) 50 MG tablet Take 1 tablet by mouth once daily 90 tablet 0  . metoprolol succinate (TOPROL-XL) 25 MG 24 hr tablet Take 1/2 (one-half) tablet by mouth once daily 45 tablet 0  . Misc Natural Products (OSTEO BI-FLEX ADV JOINT SHIELD) TABS Take 1 tablet by mouth  2 (two) times daily.    . Multiple Vitamin (MULTIVITAMIN) tablet Take 1 tablet by mouth daily.    . nitroGLYCERIN (NITROSTAT) 0.4 MG SL tablet DISSOLVE ONE TABLET UNDER THE TONGUE EVERY 5 MINUTES AS NEEDED FOR CHEST PAIN.  DO NOT EXCEED A TOTAL OF 3 DOSES IN 15 MINUTES 25 tablet 2  . simvastatin (ZOCOR) 40 MG tablet TAKE 1 TABLET BY MOUTH ONCE DAILY IN THE EVENING 90 tablet 1   No current facility-administered medications for this visit.    Physical Exam:     Ht 6' (1.829 m)   Wt 162 lb 2 oz (73.5 kg)   BMI 21.99 kg/m  GENERAL:  Pleasant male in NAD PSYCH: : Cooperative, normal affect NEURO: Alert and oriented x 3, no focal neurologic deficits Rectal: Exam deferred given previous exams for the same   IMPRESSION and PLAN:    1) Hematochezia 2) FOBT positive stool 3) Internal hemorrhoids 4) History of anal fissure  87 year old male with essential longstanding history of scant BRBPR.  Flexible sigmoidoscopy in 2021 with internal hemorrhoids, small internal anal fissure.  Treated with topical NTG without much improvement per history.  Discussed additional DDx today, to include more proximal colonic lesion.  Discussed pros/cons of colonoscopy as well as hemorrhoid banding.  His strong preference was to avoid colonoscopy and proceed with most conservative route possible to start.  -Start with Preparation H suppository mixed with Anusol x10 days -If no significant improvement, can plan for hemorrhoid banding -No plan for colonoscopy given strong patient preference  5) Fecal seepage -Suspect related to hemorrhoids.  Evaluate for improvement with above therapy  I spent 35 minutes of time, including in depth chart review, independent review of results as outlined above, communicating results with the patient directly, face-to-face time with the patient, coordinating care, and ordering studies and medications as appropriate, and documentation.          [place] ,DO, FACG  02/04/2021, 11:45 AM

## 2021-02-04 NOTE — Patient Instructions (Signed)
If you are age 85 or older, your body mass index should be between 23-30. Your Body mass index is 21.99 kg/m. If this is out of the aforementioned range listed, please consider follow up with your Primary Care Provider.  If you are age 24 or younger, your body mass index should be between 19-25. Your Body mass index is 21.99 kg/m. If this is out of the aformentioned range listed, please consider follow up with your Primary Care Provider.    Please purchase the following medications over the counter and take as directed:  Preparation H suppositories enough to take twice a day for 10 days. 20 suppositories in total.  Pick up the hydrocortisone cream (Anusol) at your pharmacy.  Place a pea sized amount of the cream on the Preparation H suppository twice a day for 10 days  Due to recent changes in healthcare laws, you may see the results of your imaging and laboratory studies on MyChart before your provider has had a chance to review them.  We understand that in some cases there may be results that are confusing or concerning to you. Not all laboratory results come back in the same time frame and the provider may be waiting for multiple results in order to interpret others.  Please give [place] 48 hours in order for your provider to thoroughly review all the results before contacting the office for clarification of your results.   Call in 2 weeks to update [place] on your symptoms [phone]  Thank you for choosing me and [facility].  Vito Cirigliano, D.O.

## 2021-02-10 ENCOUNTER — Other Ambulatory Visit: Payer: Self-pay

## 2021-02-10 ENCOUNTER — Ambulatory Visit (INDEPENDENT_AMBULATORY_CARE_PROVIDER_SITE_OTHER): Payer: Medicare Other | Admitting: Cardiology

## 2021-02-10 ENCOUNTER — Encounter: Payer: Self-pay | Admitting: Cardiology

## 2021-02-10 VITALS — BP 118/56 | HR 49 | Ht 72.0 in | Wt 166.4 lb

## 2021-02-10 DIAGNOSIS — E7800 Pure hypercholesterolemia, unspecified: Secondary | ICD-10-CM

## 2021-02-10 DIAGNOSIS — I10 Essential (primary) hypertension: Secondary | ICD-10-CM | POA: Diagnosis not present

## 2021-02-10 DIAGNOSIS — I714 Abdominal aortic aneurysm, without rupture, unspecified: Secondary | ICD-10-CM

## 2021-02-10 DIAGNOSIS — I255 Ischemic cardiomyopathy: Secondary | ICD-10-CM

## 2021-02-10 DIAGNOSIS — I2510 Atherosclerotic heart disease of native coronary artery without angina pectoris: Secondary | ICD-10-CM | POA: Diagnosis not present

## 2021-02-10 NOTE — Patient Instructions (Signed)

## 2021-02-11 ENCOUNTER — Telehealth: Payer: Self-pay | Admitting: Gastroenterology

## 2021-02-11 LAB — CBC
Hematocrit: 34.3 % — ABNORMAL LOW (ref 37.5–51.0)
Hemoglobin: 11.5 g/dL — ABNORMAL LOW (ref 13.0–17.7)
MCH: 33 pg (ref 26.6–33.0)
MCHC: 33.5 g/dL (ref 31.5–35.7)
MCV: 98 fL — ABNORMAL HIGH (ref 79–97)
Platelets: 193 10*3/uL (ref 150–450)
RBC: 3.49 x10E6/uL — ABNORMAL LOW (ref 4.14–5.80)
RDW: 12.4 % (ref 11.6–15.4)
WBC: 9.5 10*3/uL (ref 3.4–10.8)

## 2021-02-11 NOTE — Telephone Encounter (Signed)
Pt is requesting to speak with the nurse again

## 2021-02-11 NOTE — Telephone Encounter (Signed)
Spoke with patient again, he reported all of the same information from this morning.  Advised that I have gotten this information to Dr. Bryan Lemma and we will contact him with further recommendations. Patient prefers to discontinue suppositories, he states that he is not having any issues with passing bowel movements and would like to proceed with banding if you agree.

## 2021-02-11 NOTE — Telephone Encounter (Signed)
Patient called and states he was recommended to start a regimen with suppositories for about 6 days. He said thi sis his 5th day and he having a lot of discomfort and is requesting to speak with a nurse.

## 2021-02-11 NOTE — Telephone Encounter (Signed)
Spoke with patient, he states that on Tuesday, 02/09/21 he inserted the first suppository at 4:30 PM, couldn't take it for the 2nd time, attempted twice, suppositories were floating in the commode because he could not retain it, did not feel it leaving because the numbing from the cream disguised the sensation. Patient wants to discontinue suppositories because he has "screaming loose bowels", he wants to know if you agree with discontinuing the suppositories and cream? Wants to know if you all should proceed with hemorrhoid banding. Please advise, thank you.

## 2021-02-12 NOTE — Telephone Encounter (Signed)
Lm on vm for patient to return call 

## 2021-02-12 NOTE — Telephone Encounter (Signed)
Okay, let us plan to set him up for hemorrhoid banding with me.  Thanks.

## 2021-02-13 ENCOUNTER — Other Ambulatory Visit: Payer: Self-pay | Admitting: Family Medicine

## 2021-02-13 DIAGNOSIS — I25700 Atherosclerosis of coronary artery bypass graft(s), unspecified, with unstable angina pectoris: Secondary | ICD-10-CM

## 2021-02-13 DIAGNOSIS — I10 Essential (primary) hypertension: Secondary | ICD-10-CM

## 2021-02-13 DIAGNOSIS — E7800 Pure hypercholesterolemia, unspecified: Secondary | ICD-10-CM

## 2021-02-15 NOTE — Telephone Encounter (Signed)
Lm on vm for patient to return call 

## 2021-02-16 ENCOUNTER — Encounter: Payer: Self-pay | Admitting: *Deleted

## 2021-02-19 NOTE — Telephone Encounter (Signed)
Spoke with patient to schedule 1st hemorrhoid banding, he is scheduled for Friday, 03/19/21 AT 3:20 PM. Answered all of patient's questions in regards to the hemorrhoid banding, he verbalized understanding and had no further concerns at the end of the call.

## 2021-03-01 ENCOUNTER — Other Ambulatory Visit: Payer: Self-pay | Admitting: Family Medicine

## 2021-03-04 ENCOUNTER — Telehealth: Payer: Self-pay | Admitting: Family Medicine

## 2021-03-04 NOTE — Telephone Encounter (Signed)
Caller : Khalib  Call Back : [phone]  Patient states he has bleeding gums, patient would like to know who Dr Charlett Blake recommends in the absence his dentist to see for gum problem?

## 2021-03-04 NOTE — Telephone Encounter (Signed)
Patient is calling to see if Dr.Blyth got his message

## 2021-03-04 NOTE — Telephone Encounter (Signed)
Pt would like advise

## 2021-03-04 NOTE — Telephone Encounter (Signed)
He really needs a dentist, usually if a dentist is out of town they have a Marketing executive that a patient can see. Confirm he is not having bleeding anywhere else

## 2021-03-05 NOTE — Telephone Encounter (Signed)
Left message on machine to call back  

## 2021-03-08 NOTE — Telephone Encounter (Signed)
Patient will call retired Pharmacist, community (Dr. Georgiann Hahn) office to see if they can take him.

## 2021-03-11 DIAGNOSIS — N401 Enlarged prostate with lower urinary tract symptoms: Secondary | ICD-10-CM | POA: Diagnosis not present

## 2021-03-11 DIAGNOSIS — R351 Nocturia: Secondary | ICD-10-CM | POA: Diagnosis not present

## 2021-03-11 DIAGNOSIS — R972 Elevated prostate specific antigen [PSA]: Secondary | ICD-10-CM | POA: Diagnosis not present

## 2021-03-17 ENCOUNTER — Ambulatory Visit (HOSPITAL_BASED_OUTPATIENT_CLINIC_OR_DEPARTMENT_OTHER)
Admission: RE | Admit: 2021-03-17 | Discharge: 2021-03-17 | Disposition: A | Discharge status: Home / Self Care | Payer: Medicare Other | Source: Ambulatory Visit | Attending: Family Medicine | Admitting: Family Medicine

## 2021-03-17 ENCOUNTER — Other Ambulatory Visit: Payer: Self-pay

## 2021-03-17 DIAGNOSIS — I059 Rheumatic mitral valve disease, unspecified: Secondary | ICD-10-CM | POA: Insufficient documentation

## 2021-03-17 LAB — ECHOCARDIOGRAM COMPLETE
AR max vel: 2.53 cm2
AV Area VTI: 2.77 cm2
AV Area mean vel: 2.57 cm2
AV Mean grad: 2 mmHg
AV Peak grad: 5.9 mmHg
Ao pk vel: 1.21 m/s
Area-P 1/2: 2.74 cm2
P 1/2 time: 979 msec
S' Lateral: 4.69 cm
Single Plane A4C EF: 36.9 %

## 2021-03-19 ENCOUNTER — Other Ambulatory Visit: Payer: Self-pay

## 2021-03-19 ENCOUNTER — Encounter: Payer: Self-pay | Admitting: Gastroenterology

## 2021-03-19 ENCOUNTER — Ambulatory Visit (INDEPENDENT_AMBULATORY_CARE_PROVIDER_SITE_OTHER): Payer: Medicare Other | Admitting: Gastroenterology

## 2021-03-19 DIAGNOSIS — K921 Melena: Secondary | ICD-10-CM | POA: Diagnosis not present

## 2021-03-19 DIAGNOSIS — K641 Second degree hemorrhoids: Secondary | ICD-10-CM

## 2021-03-19 NOTE — Patient Instructions (Signed)
If you are age 85 or older, your body mass index should be between 23-30. Your Body mass index is 21.43 kg/m. If this is out of the aforementioned range listed, please consider follow up with your Primary Care Provider.  If you are age 15 or younger, your body mass index should be between 19-25. Your Body mass index is 21.43 kg/m. If this is out of the aformentioned range listed, please consider follow up with your Primary Care Provider.   HEMORRHOID BANDING PROCEDURE    FOLLOW-UP CARE   1. The procedure you have had should have been relatively painless since the banding of the area involved does not have nerve endings and there is no pain sensation.  The rubber band cuts off the blood supply to the hemorrhoid and the band may fall off as soon as 48 hours after the banding (the band may occasionally be seen in the toilet bowl following a bowel movement). You may notice a temporary feeling of fullness in the rectum which should respond adequately to plain Tylenol or Motrin.  2. Following the banding, avoid strenuous exercise that evening and resume full activity the next day.  A sitz bath (soaking in a warm tub) or bidet is soothing, and can be useful for cleansing the area after bowel movements.     3. To avoid constipation, take two tablespoons of natural wheat bran, natural oat bran, flax, Benefiber or any over the counter fiber supplement and increase your water intake to 7-8 glasses daily.    4. Unless you have been prescribed anorectal medication, do not put anything inside your rectum for two weeks: No suppositories, enemas, fingers, etc.  5. Occasionally, you may have more bleeding than usual after the banding procedure.  This is often from the untreated hemorrhoids rather than the treated one.  Don't be concerned if there is a tablespoon or so of blood.  If there is more blood than this, lie flat with your bottom higher than your head and apply an ice pack to the area. If the bleeding  does not stop within a half an hour or if you feel faint, call our office at [phone] or go to the emergency room.  6. Problems are not common; however, if there is a substantial amount of bleeding, severe pain, chills, fever or difficulty passing urine (very rare) or other problems, you should call us at [phone] or report to the nearest emergency room.  7. Do not stay seated continuously for more than 2-3 hours for a day or two after the procedure.  Tighten your buttock muscles 10-15 times every two hours and take 10-15 deep breaths every 1-2 hours.  Do not spend more than a few minutes on the toilet if you cannot empty your bowel; instead re-visit the toilet at a later time.    Due to recent changes in healthcare laws, you may see the results of your imaging and laboratory studies on MyChart before your provider has had a chance to review them.  We understand that in some cases there may be results that are confusing or concerning to you. Not all laboratory results come back in the same time frame and the provider may be waiting for multiple results in order to interpret others.  Please give [place] 48 hours in order for your provider to thoroughly review all the results before contacting the office for clarification of your results.   Thank you for choosing me and [facility].  [employer]  Cirigliano, D.O.

## 2021-03-19 NOTE — Progress Notes (Signed)
P  Chief Complaint:    Symptomatic Internal Hemorrhoids; Hemorrhoid Band Ligation  GI History: 87 year old male history of CAD/CABG 1979/9093, hyperlipidemia, HTN, BPH, initially seen in the GI clinic on 12/18/2019 for evaluation of hematochezia.  Rectal exam n/f internal hemorrhoids and possible internal anal fissure.  History of anal fissure surgery in his 49s.  Flexible sigmoidoscopy 12/2019 as below.  Treated w/ topical NTG x6 weeks without much improvement.  Treated with Preparation H suppository mixed with Anusol x10 days in 01/2021, again with only slight improvement.  Labs in 12/2020 reviewed: FOBT positive, normal ESR, CMP.  Stable mild anemia with H/H 12.4/39.8.  Patient would like to avoid colonoscopy.  - 03/19/2021: Presents for hemorrhoid banding #1  Endoscopic history: -Flexible sigmoidoscopy (12/2019, Dr. Bryan Lemma): Small internal anal fissure, 8 mm tubular adenoma in sigmoid, sigmoid diverticulosis, localized area of mild inflammation at 20-23 cm from anal verge, located in area of dense diverticulosis (path: Benign), Medium-sized internal hemorrhoids.  Granular mucosa at anal verge (path: Necrotic debris without AIN) -Colonoscopy (02/2010, Dr. Ardis Hughs): Moderate to severe left-sided diverticulosis with peridiverticular edema, tortuosity of the colon. Normal retroflexion. -Colonoscopy (2007, Dr. Velora Heckler): Benign polyp, repeat in 4 years  HPI:     Patient is a 87 y.o. malewith a history of symptomatic internal hemorrhoids (hematochezia, fecal seepage) presenting to the Gastroenterology Clinic for follow-up and ongoing treatment. The patient presents with symptomatic grade 2 hemorrhoids, unresponsive to maximal medical therapy, requesting rubber band ligation of symptomatic hemorrhoidal disease.  No change in medical or surgical history, medications, allergies, social history since last appointment with me.   Review of systems:     No chest pain, no SOB, no fevers, no  urinary sx   Past Medical History:  Diagnosis Date  . Abdominal aortic aneurysm ([place])    a. [place] (1/14):  3.3 x 3.4 cm => f/u 11/2013  . Amebic dysentery   . AMEBIC DYSENTERY 11/19/2007   Qualifier: History of  By: Lenna Gilford MD, Deborra Medina   . Anemia 03/07/2017  . ANXIETY 11/19/2007   Qualifier: Diagnosis of  By: Lenna Gilford MD, Deborra Medina   . Arthritis 03/07/2017  . Atherosclerosis of coronary artery bypass graft with unstable angina pectoris ([place]) 04/19/2013  . BACK PAIN, LUMBAR 11/16/2007   Qualifier: Diagnosis of  By: Julien Girt CMA, Leigh    . Benign prostatic hypertrophy   . BENIGN PROSTATIC HYPERTROPHY, HX OF 11/16/2007   Qualifier: Diagnosis of  By: Julien Girt CMA, Leigh    . BRBPR (bright red blood per rectum) 11/01/2016  . CAD (coronary artery disease)    a. s/p CABG in 1979 and 1993;  b. LHC (5/14):  LM, LAD, CFX and RCA occluded; L-LAD ok, dLAD occluded after insertion of LIMA, S-OM occluded, S-PDA/AM 80-90 => PCI with Promus DES; EF 25%  . Cardiomyopathy, ischemic 06/05/2013  . Cerumen impaction    Bilateral  . Chicken pox as a child  . Chronic systolic CHF (congestive heart failure) ([place])   . COLONIC POLYPS 07/01/2008   Qualifier: Diagnosis of  By: Lenna Gilford MD, Deborra Medina   . Degenerative joint disease   . DEGENERATIVE JOINT DISEASE 11/16/2007   Qualifier: Diagnosis of  By: Julien Girt CMA, Marliss Czar    . Diverticulosis of colon   . DIVERTICULOSIS OF COLON 07/01/2008   Qualifier: Diagnosis of  By: Lenna Gilford MD, Deborra Medina   . Double vision 03/07/2017  . Essential hypertension 11/16/2007   Qualifier: Diagnosis of  By: Julien Girt CMA, Marliss Czar    .  Fingernail abnormalities 03/07/2017  . FLANK PAIN, RIGHT 02/03/2010   Qualifier: History of  By: Lenna Gilford MD, Deborra Medina   . GERD (gastroesophageal reflux disease)   . GOUT 11/16/2007   Qualifier: Diagnosis of  By: Julien Girt CMA, Marliss Czar    . Hearing loss 11/24/2014  . Heart murmur   . History of shingles 10/27/2017  . Hypercholesterolemia   . HYPERCHOLESTEROLEMIA 11/16/2007   Qualifier:  Diagnosis of  By: Julien Girt CMA, Marliss Czar    . Ischemic cardiomyopathy    a. echo (09/05/13): EF 35%, diffuse HK worsened distal septal, mid/distal inferior and apical region, grade 1 diastolic dysfunction, mild LAE.    [name] Kidney stone 08/17/2011  . Loss of hearing   . Lumbar back pain   . Measles as a child  . Medicare annual wellness visit, subsequent 11/24/2014   Sees Dr Delman Cheadle for dermatology Sees Dr Roni Bread of Urology Sees Dr Stanford Breed of cardiology Sees Dr Virginia Rochester of Opthamology No further colonoscopies warranted       . Mumps as a child  . Nephrolithiasis   . Pain in joint, lower leg 07/29/2014  . PERIPHERAL VASCULAR DISEASE 11/16/2007   Qualifier: Diagnosis of  By: Julien Girt CMA, Marliss Czar    . Peripheral vascular disease ([place])   . Rectal bleeding 03/07/2017  . Shingles 07/29/2014  . Sun-damaged skin 05/31/2014    Patient's surgical history, family medical history, social history, medications and allergies were all reviewed in Epic    Current Outpatient Medications  Medication Sig Dispense Refill  . allopurinol (ZYLOPRIM) 300 MG tablet Take 1 tablet (300 mg total) by mouth daily. 90 tablet 1  . aspirin EC 81 MG tablet Take 81 mg by mouth every morning.     . Cholecalciferol (VITAMIN D-3 PO) Take 5,000 Units by mouth daily with breakfast.    . Cyanocobalamin (CVS B-12) 500 MCG SUBL Place under the tongue.    . famotidine (PEPCID) 20 MG tablet Take 1 tablet (20 mg total) by mouth 2 (two) times daily. 180 tablet 1  . Ferrous Fumarate-Folic Acid (HEMOCYTE-F) 324-1 MG TABS Take 1 tablet by mouth daily. 30 tablet 3  . finasteride (PROSCAR) 5 MG tablet Takes every third day    . folic acid (FOLVITE) 166 MCG tablet Take 400 mcg by mouth 2 (two) times daily.    . hydrocortisone (ANUSOL-HC) 2.5 % rectal cream Place 1 application rectally 2 (two) times daily. 30 g 0  . isosorbide mononitrate (IMDUR) 30 MG 24 hr tablet Take 3 tablets (90 mg total) by mouth daily. 270 tablet 1  . losartan (COZAAR) 50 MG tablet Take  1 tablet by mouth once daily 90 tablet 0  . metoprolol succinate (TOPROL-XL) 25 MG 24 hr tablet Take 1/2 (one-half) tablet by mouth once daily 45 tablet 0  . Misc Natural Products (OSTEO BI-FLEX ADV JOINT SHIELD) TABS Take 1 tablet by mouth 2 (two) times daily.    . Multiple Vitamin (MULTIVITAMIN) tablet Take 1 tablet by mouth daily.    . nitroGLYCERIN (NITROSTAT) 0.4 MG SL tablet DISSOLVE ONE TABLET UNDER THE TONGUE EVERY 5 MINUTES AS NEEDED FOR CHEST PAIN.  DO NOT EXCEED A TOTAL OF 3 DOSES IN 15 MINUTES 25 tablet 2  . simvastatin (ZOCOR) 40 MG tablet Take 1 tablet (40 mg total) by mouth every evening. 90 tablet 1   No current facility-administered medications for this visit.    Physical Exam:     BP 138/60   Pulse (!) 58   Ht 6' (  1.829 m)   Wt 158 lb (71.7 kg)   BMI 21.43 kg/m   GENERAL:  Pleasant male in NAD PSYCH: : Cooperative, normal affect NEURO: Alert and oriented x 3, no focal neurologic deficits Rectal exam: Sensation intact and preserved anal wink.  Grade 2 hemorrhoids noted in all positions on anoscopy.  No external anal fissures noted. Normal sphincter tone. No palpable mass. No blood on the exam glove. (Chaperone: Curlene Labrum, CMA).   IMPRESSION and PLAN:    #1.  Symptomatic internal hemorrhoids: PROCEDURE NOTE: The patient presents with symptomatic grade 2 hemorrhoids, unresponsive to maximal medical therapy, requesting rubber band ligation of symptomatic hemorrhoidal disease.  All risks, benefits and alternative forms of therapy were described and informed consent was obtained.  In the Left Lateral Decubitus position, anoscopic examination revealed grade 2 hemorrhoids in the all position(s), including inflamed hemorrhoids in the LL and RA positions. The anorectum was pre-medicated with RectiCare. The decision was made to band the LL internal hemorrhoid, and the [place] was used to perform band ligation without complication.  Digital anorectal examination  was then performed to assure proper positioning of the band, and to adjust the banded tissue as required.  The patient was discharged home without pain or other issues.  Dietary and behavioral recommendations were given and along with follow-up instructions.     The following adjunctive treatments were recommended:  -Resume high-fiber diet with fiber supplement (i.e. Citrucel or Benefiber) with goal for soft stools without straining to have a BM. -Resume adequate fluid intake.  The patient will return in 4 for  follow-up and possible additional banding as required. No complications were encountered and the patient tolerated the procedure well.       Lavena Bullion ,DO, FACG 03/19/2021, 3:03 PM

## 2021-03-22 ENCOUNTER — Encounter: Payer: Self-pay | Admitting: *Deleted

## 2021-03-24 ENCOUNTER — Encounter: Payer: Self-pay | Admitting: *Deleted

## 2021-03-26 ENCOUNTER — Telehealth: Payer: Self-pay | Admitting: Family Medicine

## 2021-03-26 NOTE — Telephone Encounter (Signed)
Pt, wants to increase his ibuprofen dosage Pt, states please leave a message because he might be doing yard work  Please advice

## 2021-03-26 NOTE — Telephone Encounter (Signed)
Spoke with pt wanted to know if he could increase ibuprofen 200 mg from 2 capsules to 3 capsules. Per Mickel Baas after looking at his labs she said it was fine. Pt is aware

## 2021-04-08 ENCOUNTER — Ambulatory Visit (INDEPENDENT_AMBULATORY_CARE_PROVIDER_SITE_OTHER): Payer: Medicare Other | Admitting: Gastroenterology

## 2021-04-08 ENCOUNTER — Encounter: Payer: Self-pay | Admitting: Gastroenterology

## 2021-04-08 ENCOUNTER — Other Ambulatory Visit: Payer: Self-pay

## 2021-04-08 DIAGNOSIS — K641 Second degree hemorrhoids: Secondary | ICD-10-CM | POA: Diagnosis not present

## 2021-04-08 NOTE — Progress Notes (Signed)
P  Chief Complaint:    Symptomatic Internal Hemorrhoids; Hemorrhoid Band Ligation  GI History: 87 year old male history ofCAD/CABG 1979/9093, hyperlipidemia, HTN, BPH, initially seen in the GI clinic on 12/18/2019 for evaluation of hematochezia. Rectal exam n/finternal hemorrhoids and possible internal anal fissure. History of anal fissure surgery in his 85s.  Flexible sigmoidoscopy 12/2019 as below. Treated w/ topical NTG x6 weeks without much improvement.  Treated with Preparation H suppository mixed with Anusol x10 days in 01/2021, again with only slight improvement.  Labs in 12/2020 reviewed: FOBT positive, normal ESR, CMP. Stable mild anemia with H/H 12.4/39.8.  Patient would like to avoid colonoscopy.  -03/19/2021: Successful banding of the LL hemorrhoid -  04/08/2021: Presents for hemorrhoid banding #2  Endoscopic history: -Flexible sigmoidoscopy (12/2019, Dr. Bryan Lemma): Small internal anal fissure, 8 mm tubular adenoma in sigmoid, sigmoid diverticulosis, localized area of mild inflammation at 20-23 cm from anal verge, located in area of dense diverticulosis (path: Benign), Medium-sized internal hemorrhoids. Granular mucosa at anal verge (path: Necrotic debris without AIN) -Colonoscopy (02/2010, Dr. Ardis Hughs): Moderate to severe left-sided diverticulosis with peridiverticular edema, tortuosity of the colon. Normal retroflexion. -Colonoscopy (2007, Dr. Velora Heckler): Benign polyp, repeat in 4 years   HPI:     Patient is a 87 y.o. malewith a history of symptomatic internal hemorrhoids presenting to the Gastroenterology Clinic for follow-up and ongoing treatment. The patient presents with symptomatic grade 2 hemorrhoids, unresponsive to maximal medical therapy, requesting rubber band ligation of symptomatic hemorrhoidal disease.  Overall did well with the first hemorrhoid banding.  Did notice some BRBPR for the first week or so, which has tapered off over the last few days.  Presents  today for hemorrhoid banding #2.  No change in medical or surgical history, medications, allergies, social history since last appointment with me.   Review of systems:     No chest pain, no SOB, no fevers, no urinary sx   Past Medical History:  Diagnosis Date  . Abdominal aortic aneurysm ([place])    a. [place] (1/14):  3.3 x 3.4 cm => f/u 11/2013  . Amebic dysentery   . AMEBIC DYSENTERY 11/19/2007   Qualifier: History of  By: Lenna Gilford MD, Deborra Medina   . Anemia 03/07/2017  . ANXIETY 11/19/2007   Qualifier: Diagnosis of  By: Lenna Gilford MD, Deborra Medina   . Arthritis 03/07/2017  . Atherosclerosis of coronary artery bypass graft with unstable angina pectoris ([place]) 04/19/2013  . BACK PAIN, LUMBAR 11/16/2007   Qualifier: Diagnosis of  By: Julien Girt CMA, Leigh    . Benign prostatic hypertrophy   . BENIGN PROSTATIC HYPERTROPHY, HX OF 11/16/2007   Qualifier: Diagnosis of  By: Julien Girt CMA, Leigh    . BRBPR (bright red blood per rectum) 11/01/2016  . CAD (coronary artery disease)    a. s/p CABG in 1979 and 1993;  b. LHC (5/14):  LM, LAD, CFX and RCA occluded; L-LAD ok, dLAD occluded after insertion of LIMA, S-OM occluded, S-PDA/AM 80-90 => PCI with Promus DES; EF 25%  . Cardiomyopathy, ischemic 06/05/2013  . Cerumen impaction    Bilateral  . Chicken pox as a child  . Chronic systolic CHF (congestive heart failure) ([place])   . COLONIC POLYPS 07/01/2008   Qualifier: Diagnosis of  By: Lenna Gilford MD, Deborra Medina   . Degenerative joint disease   . DEGENERATIVE JOINT DISEASE 11/16/2007   Qualifier: Diagnosis of  By: Julien Girt CMA, Marliss Czar    . Diverticulosis of colon   . DIVERTICULOSIS OF COLON 07/01/2008  Qualifier: Diagnosis of  By: Lenna Gilford MD, Deborra Medina   . Double vision 03/07/2017  . Essential hypertension 11/16/2007   Qualifier: Diagnosis of  By: Julien Girt CMA, Marliss Czar    . Fingernail abnormalities 03/07/2017  . FLANK PAIN, RIGHT 02/03/2010   Qualifier: History of  By: Lenna Gilford MD, Deborra Medina   . GERD (gastroesophageal reflux disease)   . GOUT  11/16/2007   Qualifier: Diagnosis of  By: Julien Girt CMA, Marliss Czar    . Hearing loss 11/24/2014  . Heart murmur   . History of shingles 10/27/2017  . Hypercholesterolemia   . HYPERCHOLESTEROLEMIA 11/16/2007   Qualifier: Diagnosis of  By: Julien Girt CMA, Marliss Czar    . Ischemic cardiomyopathy    a. echo (09/05/13): EF 35%, diffuse HK worsened distal septal, mid/distal inferior and apical region, grade 1 diastolic dysfunction, mild LAE.    [name] Kidney stone 08/17/2011  . Loss of hearing   . Lumbar back pain   . Measles as a child  . Medicare annual wellness visit, subsequent 11/24/2014   Sees Dr Delman Cheadle for dermatology Sees Dr Roni Bread of Urology Sees Dr Stanford Breed of cardiology Sees Dr Virginia Rochester of Opthamology No further colonoscopies warranted       . Mumps as a child  . Nephrolithiasis   . Pain in joint, lower leg 07/29/2014  . PERIPHERAL VASCULAR DISEASE 11/16/2007   Qualifier: Diagnosis of  By: Julien Girt CMA, Marliss Czar    . Peripheral vascular disease ([place])   . Rectal bleeding 03/07/2017  . Shingles 07/29/2014  . Sun-damaged skin 05/31/2014    Patient's surgical history, family medical history, social history, medications and allergies were all reviewed in Epic    Current Outpatient Medications  Medication Sig Dispense Refill  . allopurinol (ZYLOPRIM) 300 MG tablet Take 1 tablet (300 mg total) by mouth daily. 90 tablet 1  . aspirin EC 81 MG tablet Take 81 mg by mouth every morning.     . Cholecalciferol (VITAMIN D-3 PO) Take 5,000 Units by mouth daily with breakfast.    . Cyanocobalamin (CVS B-12) 500 MCG SUBL Place under the tongue.    . famotidine (PEPCID) 20 MG tablet Take 1 tablet (20 mg total) by mouth 2 (two) times daily. 180 tablet 1  . finasteride (PROSCAR) 5 MG tablet Takes every third day    . folic acid (FOLVITE) 546 MCG tablet Take 400 mcg by mouth 2 (two) times daily.    . isosorbide mononitrate (IMDUR) 30 MG 24 hr tablet Take 3 tablets (90 mg total) by mouth daily. 270 tablet 1  . losartan (COZAAR) 50 MG  tablet Take 1 tablet by mouth once daily 90 tablet 0  . metoprolol succinate (TOPROL-XL) 25 MG 24 hr tablet Take 1/2 (one-half) tablet by mouth once daily 45 tablet 0  . Misc Natural Products (OSTEO BI-FLEX ADV JOINT SHIELD) TABS Take 1 tablet by mouth 2 (two) times daily.    . Multiple Vitamin (MULTIVITAMIN) tablet Take 1 tablet by mouth daily.    . simvastatin (ZOCOR) 40 MG tablet Take 1 tablet (40 mg total) by mouth every evening. 90 tablet 1  . nitroGLYCERIN (NITROSTAT) 0.4 MG SL tablet DISSOLVE ONE TABLET UNDER THE TONGUE EVERY 5 MINUTES AS NEEDED FOR CHEST PAIN.  DO NOT EXCEED A TOTAL OF 3 DOSES IN 15 MINUTES (Patient not taking: Reported on 04/08/2021) 25 tablet 2   No current facility-administered medications for this visit.    Physical Exam:     BP 126/60   Pulse (!) 56  Ht 6' (1.829 m)   Wt 157 lb 6 oz (71.4 kg)   BMI 21.34 kg/m   GENERAL:  Pleasant male in NAD PSYCH: : Cooperative, normal affect NEURO: Alert and oriented x 3, no focal neurologic deficits Rectal exam: Sensation intact and preserved anal wink.  Grade 2 hemorrhoids noted in RP position and grade 1 in RA position.  No external anal fissures noted. Normal sphincter tone. No palpable mass. No blood on the exam glove. (Chaperone: Curlene Labrum, CMA).   IMPRESSION and PLAN:    #1.  Symptomatic internal hemorrhoids: PROCEDURE NOTE: The patient presents with symptomatic grade 2 hemorrhoids, unresponsive to maximal medical therapy, requesting rubber band ligation of symptomatic hemorrhoidal disease.  All risks, benefits and alternative forms of therapy were described and informed consent was obtained.  In the Left Lateral Decubitus position, anoscopic examination revealed grade 2 hemorrhoids in the Rp position and grade 1 in RA position  The anorectum was pre-medicated with RectiCare. The decision was made to band the RP internal hemorrhoid, and the [place] was used to perform band ligation without  complication.  Digital anorectal examination was then performed to assure proper positioning of the band, and to adjust the banded tissue as required.  The patient was discharged home without pain or other issues.  Dietary and behavioral recommendations were given and along with follow-up instructions.     The following adjunctive treatments were recommended:  -Resume high-fiber diet with fiber supplement (i.e. Citrucel or Benefiber) with goal for soft stools without straining to have a BM. -Resume adequate fluid intake.  The patient will return as needed follow-up and possible additional banding if continued or recurrence of hemorrhoidal symptoms. No complications were encountered and the patient tolerated the procedure well.    Lavena Bullion ,DO, FACG 04/08/2021, 2:33 PM

## 2021-04-08 NOTE — Patient Instructions (Signed)
If you are age 85 or older, your body mass index should be between 23-30. Your Body mass index is 21.34 kg/m. If this is out of the aforementioned range listed, please consider follow up with your Primary Care Provider.  If you are age 59 or younger, your body mass index should be between 19-25. Your Body mass index is 21.34 kg/m. If this is out of the aformentioned range listed, please consider follow up with your Primary Care Provider.   HEMORRHOID BANDING PROCEDURE    FOLLOW-UP CARE   1. The procedure you have had should have been relatively painless since the banding of the area involved does not have nerve endings and there is no pain sensation.  The rubber band cuts off the blood supply to the hemorrhoid and the band may fall off as soon as 48 hours after the banding (the band may occasionally be seen in the toilet bowl following a bowel movement). You may notice a temporary feeling of fullness in the rectum which should respond adequately to plain Tylenol or Motrin.  2. Following the banding, avoid strenuous exercise that evening and resume full activity the next day.  A sitz bath (soaking in a warm tub) or bidet is soothing, and can be useful for cleansing the area after bowel movements.     3. To avoid constipation, take two tablespoons of natural wheat bran, natural oat bran, flax, Benefiber or any over the counter fiber supplement and increase your water intake to 7-8 glasses daily.    4. Unless you have been prescribed anorectal medication, do not put anything inside your rectum for two weeks: No suppositories, enemas, fingers, etc.  5. Occasionally, you may have more bleeding than usual after the banding procedure.  This is often from the untreated hemorrhoids rather than the treated one.  Don't be concerned if there is a tablespoon or so of blood.  If there is more blood than this, lie flat with your bottom higher than your head and apply an ice pack to the area. If the bleeding  does not stop within a half an hour or if you feel faint, call our office at [phone] or go to the emergency room.  6. Problems are not common; however, if there is a substantial amount of bleeding, severe pain, chills, fever or difficulty passing urine (very rare) or other problems, you should call us at [phone] or report to the nearest emergency room.  7. Do not stay seated continuously for more than 2-3 hours for a day or two after the procedure.  Tighten your buttock muscles 10-15 times every two hours and take 10-15 deep breaths every 1-2 hours.  Do not spend more than a few minutes on the toilet if you cannot empty your bowel; instead re-visit the toilet at a later time.    Due to recent changes in healthcare laws, you may see the results of your imaging and laboratory studies on MyChart before your provider has had a chance to review them.  We understand that in some cases there may be results that are confusing or concerning to you. Not all laboratory results come back in the same time frame and the provider may be waiting for multiple results in order to interpret others.  Please give [place] 48 hours in order for your provider to thoroughly review all the results before contacting the office for clarification of your results.   Thank you for choosing me and [facility].  [employer]  Cirigliano, D.O.

## 2021-04-22 ENCOUNTER — Telehealth: Payer: Self-pay | Admitting: Family Medicine

## 2021-04-22 NOTE — Telephone Encounter (Signed)
No he should not take 4 pills at a time it is safer to take 2 advil 4 x a day and can add 2 Tylenol ES 500 mg tabs 3 x a day to see if that helps the pain. 4 advil at a time is too tough on the kidneys

## 2021-04-22 NOTE — Telephone Encounter (Signed)
Are you ok with him increasing?

## 2021-04-22 NOTE — Telephone Encounter (Signed)
Pt, called he would like to increase his ibuprofen to 4 pills twice a day his have pain to his right leg from hip to his toe pt usually  take 3 pills a day and he states it's not working

## 2021-04-23 ENCOUNTER — Telehealth: Payer: Self-pay

## 2021-04-23 NOTE — Telephone Encounter (Signed)
Spoke with pt and he is aware of instructions

## 2021-04-23 NOTE — Telephone Encounter (Signed)
Pt called back in wanted to know if we could clarify Dr. Frederik Pear instructions and I did but he would like to discuss it with a nurse.

## 2021-04-23 NOTE — Telephone Encounter (Signed)
Spoke with patient regarding medication schedule.  Reviewed PCP instructions and answered all questions.  Patient verbalized understanding, will call back on Tuesday if pain continues/worsens.

## 2021-04-23 NOTE — Telephone Encounter (Signed)
error 

## 2021-05-03 ENCOUNTER — Other Ambulatory Visit: Payer: Self-pay | Admitting: Family Medicine

## 2021-05-04 ENCOUNTER — Ambulatory Visit: Payer: Medicare Other | Admitting: Gastroenterology

## 2021-05-11 ENCOUNTER — Other Ambulatory Visit: Payer: Self-pay

## 2021-05-11 ENCOUNTER — Ambulatory Visit (INDEPENDENT_AMBULATORY_CARE_PROVIDER_SITE_OTHER): Payer: Medicare Other | Admitting: Family Medicine

## 2021-05-11 VITALS — BP 114/62 | HR 72 | Temp 98.2°F | Resp 16 | Wt 156.0 lb

## 2021-05-11 DIAGNOSIS — I10 Essential (primary) hypertension: Secondary | ICD-10-CM | POA: Diagnosis not present

## 2021-05-11 DIAGNOSIS — R739 Hyperglycemia, unspecified: Secondary | ICD-10-CM | POA: Diagnosis not present

## 2021-05-11 DIAGNOSIS — E7800 Pure hypercholesterolemia, unspecified: Secondary | ICD-10-CM

## 2021-05-11 DIAGNOSIS — N189 Chronic kidney disease, unspecified: Secondary | ICD-10-CM | POA: Diagnosis not present

## 2021-05-11 DIAGNOSIS — F411 Generalized anxiety disorder: Secondary | ICD-10-CM | POA: Diagnosis not present

## 2021-05-11 DIAGNOSIS — D510 Vitamin B12 deficiency anemia due to intrinsic factor deficiency: Secondary | ICD-10-CM

## 2021-05-11 DIAGNOSIS — I255 Ischemic cardiomyopathy: Secondary | ICD-10-CM | POA: Diagnosis not present

## 2021-05-11 LAB — COMPREHENSIVE METABOLIC PANEL
ALT: 17 U/L (ref 0–53)
AST: 20 U/L (ref 0–37)
Albumin: 4.3 g/dL (ref 3.5–5.2)
Alkaline Phosphatase: 55 U/L (ref 39–117)
BUN: 31 mg/dL — ABNORMAL HIGH (ref 6–23)
CO2: 23 mEq/L (ref 19–32)
Calcium: 9.4 mg/dL (ref 8.4–10.5)
Chloride: 106 mEq/L (ref 96–112)
Creatinine, Ser: 1.29 mg/dL (ref 0.40–1.50)
GFR: 47.27 mL/min — ABNORMAL LOW (ref >60.00)
Glucose, Bld: 94 mg/dL (ref 70–99)
Potassium: 4.3 mEq/L (ref 3.5–5.1)
Sodium: 137 mEq/L (ref 135–145)
Total Bilirubin: 1.1 mg/dL (ref 0.2–1.2)
Total Protein: 6.5 g/dL (ref 6.0–8.3)

## 2021-05-11 LAB — CBC WITH DIFFERENTIAL/PLATELET
Basophils Absolute: 0 10*3/uL (ref 0.0–0.1)
Basophils Relative: 0.4 % (ref 0.0–3.0)
Eosinophils Absolute: 0.1 10*3/uL (ref 0.0–0.7)
Eosinophils Relative: 1.5 % (ref 0.0–5.0)
HCT: 36.8 % — ABNORMAL LOW (ref 39.0–52.0)
Hemoglobin: 12.6 g/dL — ABNORMAL LOW (ref 13.0–17.0)
Lymphocytes Relative: 17.4 % (ref 12.0–46.0)
Lymphs Abs: 1.5 10*3/uL (ref 0.7–4.0)
MCHC: 34.2 g/dL (ref 30.0–36.0)
MCV: 97.7 fl (ref 78.0–100.0)
Monocytes Absolute: 0.6 10*3/uL (ref 0.1–1.0)
Monocytes Relative: 6.7 % (ref 3.0–12.0)
Neutro Abs: 6.5 10*3/uL (ref 1.4–7.7)
Neutrophils Relative %: 74 % (ref 43.0–77.0)
Platelets: 191 10*3/uL (ref 150.0–400.0)
RBC: 3.77 Mil/uL — ABNORMAL LOW (ref 4.22–5.81)
RDW: 14.9 % (ref 11.5–15.5)
WBC: 8.7 10*3/uL (ref 4.0–10.5)

## 2021-05-11 MED ORDER — CYANOCOBALAMIN 1000 MCG/ML IJ SOLN
1000.0000 ug | Freq: Once | INTRAMUSCULAR | 0 refills | Status: DC
Start: 2021-05-11 — End: 2021-05-11

## 2021-05-11 MED ORDER — CYANOCOBALAMIN 1000 MCG/ML IJ SOLN
1000.0000 ug | Freq: Once | INTRAMUSCULAR | Status: AC
  Administered 2021-05-11: 1000 ug via INTRAMUSCULAR

## 2021-05-11 NOTE — Progress Notes (Signed)
Patient ID: Daniel Reeves, male    DOB: [birth year]  Age: 87 y.o. MRN: [id number]    Subjective:  Subjective  HPI Daniel Reeves presents for office visit today for follow up on hypertension and medication management of pain. He reports that towards the end of April and the rest of May he increased his ibuprofen dosage from 2 to 3 pills and expressed interest in increasing it to 4. However, he was told to try 2 Advil pills and 2 acetaminophen pills. He reports that he takes it 3x times a day at 6 am, 12 noon, and 6 pm. He reports that he usually avoids taking his 3rd dosage, but since he has tried taking the 3rd dosage, he states that his sleep is a lot better. He denies any chest pain, SOB, fever, abdominal pain, cough, chills, sore throat, dysuria, urinary incontinence, back pain, HA, or N/VD.    Review of Systems  Constitutional:  Negative for chills, fatigue and fever.  HENT:  Negative for congestion, rhinorrhea, sinus pressure, sinus pain and sore throat.   Eyes:  Negative for pain.  Respiratory:  Negative for cough and shortness of breath.   Cardiovascular:  Negative for chest pain, palpitations and leg swelling.  Gastrointestinal:  Negative for abdominal pain, blood in stool, diarrhea, nausea and vomiting.  Genitourinary:  Negative for flank pain, frequency and penile pain.  Musculoskeletal:  Negative for back pain.  Neurological:  Negative for headaches.   History Past Medical History:  Diagnosis Date   Abdominal aortic aneurysm ([place])    a. 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 s/p CABG in 1979 and 1993;  b. LHC (5/14):  LM, LAD, CFX and RCA occluded; L-LAD ok, dLAD occluded after insertion of LIMA, S-OM occluded, S-PDA/AM 80-90 => PCI with Promus DES; EF 25%   Cardiomyopathy, ischemic 06/05/2013   Cerumen impaction    Bilateral   Chicken pox as a child   Chronic systolic CHF (congestive heart failure) ([place])    COLONIC POLYPS 07/01/2008   Qualifier: Diagnosis of  By: Lenna Gilford MD, Scott M    Degenerative joint disease    DEGENERATIVE JOINT DISEASE 11/16/2007   Qualifier: Diagnosis of  By: Julien Girt CMA, Leigh     Diverticulosis of colon    DIVERTICULOSIS OF COLON 07/01/2008   Qualifier: Diagnosis of  By: Lenna Gilford MD, Deborra Medina    Double vision 03/07/2017   Essential hypertension 11/16/2007   Qualifier: Diagnosis of  By: Julien Girt CMA, Leigh     Fingernail abnormalities 03/07/2017   FLANK PAIN, RIGHT 02/03/2010   Qualifier: History of  By: Lenna Gilford MD, Deborra Medina    GERD (gastroesophageal reflux disease)    GOUT 11/16/2007   Qualifier: Diagnosis of  By: Julien Girt CMA, Marliss Czar  Hearing loss 11/24/2014   Heart murmur    History of shingles 10/27/2017   Hypercholesterolemia    HYPERCHOLESTEROLEMIA 11/16/2007   Qualifier: Diagnosis of  By: Julien Girt CMA, Leigh     Ischemic cardiomyopathy    a. echo (09/05/13): EF 35%, diffuse HK worsened distal septal, mid/distal inferior and apical region, grade 1 diastolic dysfunction, mild LAE.     Kidney stone 08/17/2011   Loss of hearing    Lumbar back pain    Measles as a child   Medicare annual wellness visit, subsequent 11/24/2014   Sees Dr Delman Cheadle for dermatology Sees Dr Roni Bread of Urology Sees Dr Stanford Breed of cardiology Sees Dr Virginia Rochester of Opthamology No further colonoscopies warranted        Mumps as a child   Nephrolithiasis    Pain in  joint, lower leg 07/29/2014   PERIPHERAL VASCULAR DISEASE 11/16/2007   Qualifier: Diagnosis of  By: Julien Girt CMA, Leigh     Peripheral vascular disease ([address])    Rectal bleeding 03/07/2017   Shingles 07/29/2014   Sun-damaged skin 05/31/2014    He has a past surgical history that includes Inguinal hernia repair (1994); Inguinal hernia repair (1996); Decompressive laminectomy (01/2006); Coronary angioplasty with stent (04/18/2013); Hemorrhoid surgery; Tonsillectomy; percutaneous coronary stent intervention (pci-s) (N/A, 04/18/2013); left heart catheterization with coronary angiogram (N/A, 09/23/2013); Coronary artery bypass graft (1979); Coronary artery bypass graft (1993); and lens implants.   His family history includes Depression in his daughter; Diabetes in his maternal grandmother and son; Heart disease in his son; Other in his son; Parkinsonism in his brother.He reports that he quit smoking about 76 years ago. He has never used smokeless tobacco. He reports current alcohol use of about 2.0 standard drinks of alcohol per week. He reports that he does not use drugs.  Current Outpatient Medications on File Prior to Visit  Medication Sig Dispense Refill   allopurinol (ZYLOPRIM) 300 MG tablet Take 1 tablet (300 mg total) by mouth daily. 90 tablet 1   aspirin EC 81 MG tablet Take 81 mg by mouth every morning.      Cholecalciferol (VITAMIN D-3 PO) Take 5,000 Units by mouth daily with breakfast.     Cyanocobalamin ([employer] B-12) 500 MCG SUBL Place under the tongue.     famotidine (PEPCID) 20 MG tablet Take 1 tablet by mouth twice daily 180 tablet 1   finasteride (PROSCAR) 5 MG tablet Takes every third day     folic acid (FOLVITE) 381 MCG tablet Take 400 mcg by mouth 2 (two) times daily.     isosorbide mononitrate (IMDUR) 30 MG 24 hr tablet Take 3 tablets by mouth once daily 270 tablet 1   losartan (COZAAR) 50 MG tablet Take 1 tablet by mouth once daily 90 tablet 0   metoprolol succinate (TOPROL-XL) 25 MG 24 hr  tablet Take 1/2 (one-half) tablet by mouth once daily 45 tablet 0   Misc Natural Products (OSTEO BI-FLEX ADV JOINT SHIELD) TABS Take 1 tablet by mouth 2 (two) times daily.     Multiple Vitamin (MULTIVITAMIN) tablet Take 1 tablet by mouth daily.     nitroGLYCERIN (NITROSTAT) 0.4 MG SL tablet DISSOLVE ONE TABLET UNDER THE TONGUE EVERY 5 MINUTES AS NEEDED FOR CHEST PAIN.  DO NOT EXCEED A TOTAL OF 3 DOSES IN 15 MINUTES 25 tablet 2   simvastatin (ZOCOR) 40 MG tablet Take 1 tablet (40 mg total) by mouth every evening. 90 tablet 1   No current facility-administered medications on file prior to  visit.     Objective:  Objective  Physical Exam Constitutional:      General: He is not in acute distress.    Appearance: Normal appearance. He is not ill-appearing or toxic-appearing.  HENT:     Head: Normocephalic and atraumatic.     Right Ear: Tympanic membrane, ear canal and external ear normal.     Left Ear: Tympanic membrane, ear canal and external ear normal.     Nose: No congestion or rhinorrhea.  Eyes:     Extraocular Movements: Extraocular movements intact.     Pupils: Pupils are equal, round, and reactive to light.  Cardiovascular:     Rate and Rhythm: Normal rate and regular rhythm.     Pulses: Normal pulses.     Heart sounds: Normal heart sounds. No murmur heard.    Comments: - 1 extra ectopic beat. Pulmonary:     Effort: Pulmonary effort is normal. No respiratory distress.     Breath sounds: Normal breath sounds. No wheezing, rhonchi or rales.  Abdominal:     General: Bowel sounds are normal.     Palpations: Abdomen is soft. There is no mass.     Tenderness: no abdominal tenderness There is no guarding.     Hernia: No hernia is present.  Musculoskeletal:        General: Normal range of motion.     Cervical back: Normal range of motion and neck supple.  Skin:    General: Skin is warm and dry.  Neurological:     Mental Status: He is alert and oriented to person, place, and time.   Psychiatric:        Behavior: Behavior normal.   BP 114/62   Pulse 72   Temp 98.2 F (36.8 C)   Resp 16   Wt 156 lb (70.8 kg)   SpO2 98%   BMI 21.16 kg/m  Wt Readings from Last 3 Encounters:  05/11/21 156 lb (70.8 kg)  04/08/21 157 lb 6 oz (71.4 kg)  03/19/21 158 lb (71.7 kg)     Lab Results  Component Value Date   WBC 8.7 05/11/2021   HGB 12.6 (L) 05/11/2021   HCT 36.8 (L) 05/11/2021   PLT 191.0 05/11/2021   GLUCOSE 94 05/11/2021   CHOL 81 02/02/2021   TRIG 94.0 02/02/2021   HDL 39.80 02/02/2021   LDLCALC 22 02/02/2021   ALT 17 05/11/2021   AST 20 05/11/2021   NA 137 05/11/2021   K 4.3 05/11/2021   CL 106 05/11/2021   CREATININE 1.29 05/11/2021   BUN 31 (H) 05/11/2021   CO2 23 05/11/2021   TSH 1.93 02/02/2021   PSA 25.19 (H) 04/03/2020   INR 1.09 02/04/2018   HGBA1C 5.6 02/02/2021    ECHOCARDIOGRAM COMPLETE  Result Date: 03/17/2021    ECHOCARDIOGRAM REPORT   Patient Name:   Daniel Reeves [facility] Date of Exam: 03/17/2021 Medical Rec #:  [id number]             Height:       72.0 in Accession #:    [id number]            Weight:       166.4 lb Date of Birth:  Sep 05, 1924             BSA:          1.970 m Patient Age:    25 years              BP:  100/60 mmHg Patient Gender: M                     HR:           59 bpm. Exam Location:  [place] Procedure: 2D Echo, Cardiac Doppler and Color Doppler Indications:    Mitral regurgitation  History:        Patient has prior history of Echocardiogram examinations, most                 recent 01/18/2017. CAD, Arrythmias:RBBB; Risk                 Factors:Hypertension.  Sonographer:    Geradine Girt Referring Phys: [place]  1. Left ventricular ejection fraction, by estimation, is 25 to 30%. The left ventricle has severely decreased function. The left ventricle has no regional wall motion abnormalities. Left ventricular diastolic function could not be evaluated.  2. Right ventricular systolic function is  normal. The right ventricular size is normal. There is normal pulmonary artery systolic pressure.  3. The mitral valve is normal in structure. No evidence of mitral valve regurgitation. No evidence of mitral stenosis.  4. The aortic valve is normal in structure. Aortic valve regurgitation is trivial. No aortic stenosis is present.  5. The inferior vena cava is normal in size with greater than 50% respiratory variability, suggesting right atrial pressure of 3 mmHg. FINDINGS  Left Ventricle: Left ventricular ejection fraction, by estimation, is 25 to 30%. The left ventricle has severely decreased function. The left ventricle has no regional wall motion abnormalities. The left ventricular internal cavity size was normal in size. There is no left ventricular hypertrophy. Left ventricular diastolic function could not be evaluated. Right Ventricle: The right ventricular size is normal. No increase in right ventricular wall thickness. Right ventricular systolic function is normal. There is normal pulmonary artery systolic pressure. The tricuspid regurgitant velocity is 2.51 m/s, and  with an assumed right atrial pressure of 3 mmHg, the estimated right ventricular systolic pressure is 09.2 mmHg. Left Atrium: Left atrial size was normal in size. Right Atrium: Right atrial size was normal in size. Pericardium: There is no evidence of pericardial effusion. Mitral Valve: The mitral valve is normal in structure. No evidence of mitral valve regurgitation. No evidence of mitral valve stenosis. Tricuspid Valve: The tricuspid valve is normal in structure. Tricuspid valve regurgitation is mild . No evidence of tricuspid stenosis. Aortic Valve: The aortic valve is normal in structure. Aortic valve regurgitation is trivial. Aortic regurgitation PHT measures 979 msec. No aortic stenosis is present. Aortic valve mean gradient measures 2.0 mmHg. Aortic valve peak gradient measures 5.9  mmHg. Aortic valve area, by VTI measures 2.77 cm.  Pulmonic Valve: The pulmonic valve was normal in structure. Pulmonic valve regurgitation is not visualized. No evidence of pulmonic stenosis. Aorta: The aortic root is normal in size and structure. Venous: The inferior vena cava is normal in size with greater than 50% respiratory variability, suggesting right atrial pressure of 3 mmHg. IAS/Shunts: No atrial level shunt detected by color flow Doppler.  LEFT VENTRICLE PLAX 2D LVIDd:         5.65 cm LVIDs:         4.69 cm LV PW:         0.95 cm LV IVS:        1.10 cm LVOT diam:     2.00 cm LV SV:  68 LV SV Index:   35 LVOT Area:     3.14 cm  LV Volumes (MOD) LV vol d, MOD A4C: 153.0 ml LV vol s, MOD A4C: 96.6 ml LV SV MOD A4C:     153.0 ml LEFT ATRIUM             Index       RIGHT ATRIUM           Index LA diam:        4.00 cm 2.03 cm/m  RA Area:     15.70 cm LA Vol (A2C):   40.7 ml 20.66 ml/m RA Volume:   37.10 ml  18.83 ml/m LA Vol (A4C):   31.7 ml 16.09 ml/m LA Biplane Vol: 37.6 ml 19.08 ml/m  AORTIC VALVE AV Area (Vmax):    2.53 cm AV Area (Vmean):   2.57 cm AV Area (VTI):     2.77 cm AV Vmax:           121.00 cm/s AV Vmean:          68.900 cm/s AV VTI:            0.247 m AV Peak Grad:      5.9 mmHg AV Mean Grad:      2.0 mmHg LVOT Vmax:         97.30 cm/s LVOT Vmean:        56.300 cm/s LVOT VTI:          0.218 m LVOT/AV VTI ratio: 0.88 AI PHT:            979 msec  AORTA Ao Root diam: 3.20 cm Ao Asc diam:  3.10 cm MITRAL VALVE               TRICUSPID VALVE MV Area (PHT): 2.74 cm    TR Peak grad:   25.2 mmHg MV Decel Time: 277 msec    TR Vmax:        251.00 cm/s MV E velocity: 77.10 cm/s MV A velocity: 84.80 cm/s  SHUNTS MV E/A ratio:  0.91        Systemic VTI:  0.22 m                            Systemic Diam: 2.00 cm Jenne Campus MD Electronically signed by Jenne Campus MD Signature Date/Time: 03/17/2021/7:13:37 PM    Final      Assessment & Plan:  Plan    Meds ordered this encounter  Medications   DISCONTD: cyanocobalamin  (,VITAMIN B-12,) 1000 MCG/ML injection    Sig: Inject 1 mL (1,000 mcg total) into the muscle once for 1 dose.    Dispense:  1 mL    Refill:  0   cyanocobalamin ((VITAMIN B-12)) injection 1,000 mcg    Problem List Items Addressed This Visit     HYPERCHOLESTEROLEMIA    Tolerating statin, encouraged heart healthy diet, avoid trans fats, minimize simple carbs and saturated fats. Increase exercise as tolerated       Anxiety state    Today he is mostly frustrated with his need to hire someone to help with some of the work in his yard for the first time at age 79 he is afraid this is the beginning of a decline and he is reassured that it is appropriate to keep moving at a slower pace and report if he weakens further.        Essential  hypertension    Well controlled, no changes to meds. Encouraged heart healthy diet such as the DASH diet and exercise as tolerated.        Relevant Orders   Comprehensive metabolic panel (Completed)   Hyperglycemia    hgba1c acceptable, minimize simple carbs. Increase exercise as tolerated.        Chronic renal disease    Hydrate and monitor       Other Visit Diagnoses     Pernicious anemia    -  Primary   Relevant Medications   cyanocobalamin ((VITAMIN B-12)) injection 1,000 mcg (Completed)   Other Relevant Orders   CBC w/Diff (Completed)       Follow-up: Return for follow up 3 months OV and nurse visit for monthly b12.   I,David Hanna,acting as a scribe for Penni Homans, MD.,have documented all relevant documentation on the behalf of Penni Homans, MD,as directed by  Penni Homans, MD while in the presence of Penni Homans, MD.  I, [name], MD personally performed the services described in this documentation. All medical record entries made by the scribe were at my direction and in my presence. I have reviewed the chart and agree that the record reflects my personal performance and is accurate and complete

## 2021-05-11 NOTE — Patient Instructions (Addendum)
For pain control take Advil/Motrin/Ibuprofen 200 mg tabs, 2 tabs every 6-8 hours. Max of 2400 mg of Ibuprofen in 24 hours. 400 mg per dose is best. (Max of 6 doses a day) (max of 12 tabs in 24 hours but less is best) Can pair this with Tylenol/Acetaminophen ES 500 mg tabs, 1-2 tabs every 6-8 hours. Max of 3000 mg of Acetaminophen in 24 hours (Max of 3 doses or 6 tabs daily)  Remember NSAIDS (nonsteroidal antiinflammatory meds) are all in the same class so you cannot mix them with each other but you can mix one of them with the Tylenol  NSAIDS are listed below Advil/Motrin/Ibuprofen Aleve/Naproxen  Protein is important as we age. Nuts, seeds, meat, dairy, Protein shakes, can add Whey and/ or yellow split powder for protein

## 2021-05-12 ENCOUNTER — Telehealth: Payer: Self-pay

## 2021-05-12 NOTE — Assessment & Plan Note (Signed)
Well controlled, no changes to meds. Encouraged heart healthy diet such as the DASH diet and exercise as tolerated.  °

## 2021-05-12 NOTE — Telephone Encounter (Signed)
Pt called wanting to know if he can stop taking the CVS brand B12 500 mcg sublingual tab now that he is getting B12 injections.  Please advise.

## 2021-05-13 NOTE — Assessment & Plan Note (Signed)
Tolerating statin, encouraged heart healthy diet, avoid trans fats, minimize simple carbs and saturated fats. Increase exercise as tolerated 

## 2021-05-13 NOTE — Assessment & Plan Note (Signed)
hgba1c acceptable, minimize simple carbs. Increase exercise as tolerated.  

## 2021-05-13 NOTE — Assessment & Plan Note (Signed)
Hydrate and monitor 

## 2021-05-13 NOTE — Assessment & Plan Note (Signed)
Today he is mostly frustrated with his need to hire someone to help with some of the work in his yard for the first time at age 87 he is afraid this is the beginning of a decline and he is reassured that it is appropriate to keep moving at a slower pace and report if he weakens further.

## 2021-05-14 ENCOUNTER — Encounter: Payer: Self-pay | Admitting: *Deleted

## 2021-05-14 NOTE — Telephone Encounter (Signed)
Left message on machine to call back  

## 2021-05-14 NOTE — Telephone Encounter (Signed)
Spoke with patient and advised of note below.

## 2021-05-15 ENCOUNTER — Other Ambulatory Visit: Payer: Self-pay | Admitting: Cardiology

## 2021-05-30 DIAGNOSIS — Z20822 Contact with and (suspected) exposure to covid-19: Secondary | ICD-10-CM | POA: Diagnosis not present

## 2021-06-10 ENCOUNTER — Other Ambulatory Visit: Payer: Self-pay

## 2021-06-10 ENCOUNTER — Ambulatory Visit (INDEPENDENT_AMBULATORY_CARE_PROVIDER_SITE_OTHER): Payer: Medicare Other | Admitting: *Deleted

## 2021-06-10 DIAGNOSIS — E538 Deficiency of other specified B group vitamins: Secondary | ICD-10-CM

## 2021-06-10 MED ORDER — CYANOCOBALAMIN 1000 MCG/ML IJ SOLN
1000.0000 ug | Freq: Once | INTRAMUSCULAR | Status: AC
  Administered 2021-06-10: 1000 ug via INTRAMUSCULAR

## 2021-06-10 NOTE — Progress Notes (Addendum)
Daniel Reeves is a 87 y.o. male presents to the office today for b12:1000mg  injections, per physician's orders. Original order: on lab 02/02/21 b12, 1000mg , IM was administered left deloid today. Patient tolerated injection. Patient due for follow up labs/provider appt: yes  Date due: 08/11/21, appt made:  Patient next injection due: 07/11/21, appt made Yes.  Appointment scheduled for 07/13/21.  [name]

## 2021-06-28 ENCOUNTER — Other Ambulatory Visit: Payer: Self-pay | Admitting: Cardiology

## 2021-06-30 DIAGNOSIS — Z20822 Contact with and (suspected) exposure to covid-19: Secondary | ICD-10-CM | POA: Diagnosis not present

## 2021-07-02 ENCOUNTER — Telehealth: Payer: Self-pay | Admitting: Family Medicine

## 2021-07-02 NOTE — Telephone Encounter (Signed)
Lvm to call back

## 2021-07-02 NOTE — Telephone Encounter (Signed)
Pt is aware.  

## 2021-07-02 NOTE — Telephone Encounter (Signed)
PT called stating he cannot longer swallow his Misc Natural Products (OSTEO BI-FLEX ADV JOINT SHIELD pills. He wants advice on what do to next, stop taking the pills or change to other ones

## 2021-07-13 ENCOUNTER — Ambulatory Visit (INDEPENDENT_AMBULATORY_CARE_PROVIDER_SITE_OTHER): Payer: Medicare Other

## 2021-07-13 ENCOUNTER — Ambulatory Visit: Payer: Medicare Other

## 2021-07-13 ENCOUNTER — Other Ambulatory Visit: Payer: Self-pay

## 2021-07-13 DIAGNOSIS — E538 Deficiency of other specified B group vitamins: Secondary | ICD-10-CM | POA: Diagnosis not present

## 2021-07-13 MED ORDER — CYANOCOBALAMIN 1000 MCG/ML IJ SOLN
1000.0000 ug | Freq: Once | INTRAMUSCULAR | Status: AC
  Administered 2021-07-13: 1000 ug via INTRAMUSCULAR

## 2021-07-13 NOTE — Progress Notes (Signed)
Daniel Reeves is a 87 y.o. male presents to the office today for B12:  injections, per physician's orders. Original order: on lat 02/02/2021 Cyanocobalamin (med), 1000 mcg/ml (dose),  IM (route) was administered left deltoid (location) today. Patient tolerated injection. Patient due for follow up labs/provider appt:   Patient next injection due: in one month, appt made for 08-11-2021  Jiles Prows

## 2021-07-23 ENCOUNTER — Encounter: Payer: Self-pay | Admitting: Family Medicine

## 2021-07-26 ENCOUNTER — Telehealth: Payer: Self-pay

## 2021-07-26 NOTE — Telephone Encounter (Signed)
LVM to call back in to set up vv on Wednesday or Friday

## 2021-07-26 NOTE — Telephone Encounter (Signed)
Called pt to set up vv for Wednesday or Friday for anxiety

## 2021-07-28 ENCOUNTER — Telehealth: Payer: Self-pay | Admitting: *Deleted

## 2021-07-28 ENCOUNTER — Other Ambulatory Visit: Payer: Self-pay

## 2021-07-28 ENCOUNTER — Telehealth (INDEPENDENT_AMBULATORY_CARE_PROVIDER_SITE_OTHER): Payer: Medicare Other | Admitting: Family Medicine

## 2021-07-28 DIAGNOSIS — F411 Generalized anxiety disorder: Secondary | ICD-10-CM | POA: Diagnosis not present

## 2021-07-28 DIAGNOSIS — I10 Essential (primary) hypertension: Secondary | ICD-10-CM

## 2021-07-28 MED ORDER — BUSPIRONE HCL 5 MG PO TABS: 5.0000 mg | ORAL_TABLET | Freq: Two times a day (BID) | ORAL | 1 refills | Status: DC | PRN

## 2021-07-28 NOTE — Progress Notes (Signed)
Virtual telephone visit    Virtual Visit via Telephone Note   This visit type was conducted due to national recommendations for restrictions regarding the COVID-19 Pandemic (e.g. social distancing) in an effort to limit this patient's exposure and mitigate transmission in our community. Due to his co-morbid illnesses, this patient is at least at moderate risk for complications without adequate follow up. This format is felt to be most appropriate for this patient at this time. The patient did not have access to video technology or had technical difficulties with video requiring transitioning to audio format only (telephone). Physical exam was limited to content and character of the telephone converstion. S Richarson, CMA was able to get the patient set up on a telephone visit.   Patient location: home Patient and provider in visit Provider location: Office  I discussed the limitations of evaluation and management by telemedicine and the availability of in person appointments. The patient expressed understanding and agreed to proceed.   Visit Date: 07/28/2021  Today's healthcare provider: Penni Homans, MD     Subjective:    Patient ID: Daniel Reeves, male    DOB: Jan 31, 1924, 87 y.o.   MRN: [id number]  Chief Complaint  Patient presents with   Anxiety    Pt did not want to answer GAD questionnaire.     HPI Patient is in today for discussion regarding his increasing levels of anxiety. He is still living at home and has been working on his property for the past 1-2 years getting it ready to sell and he feels he is almost done. He does worry about not being a burden to his kids and is trying to organize his estate and worries about getting everything in order. No complaints of anhedonia or suicidal ideation. He has been discussing his giving up driving with his daughter who has offered to pick up his groceries and meds etc. He is considering. Denies  CP/palp/SOB/HA/congestion/fevers/GI or GU c/o. Taking meds as prescribed   Past Medical History:  Diagnosis Date   Abdominal aortic aneurysm ([place])    a. [place] (1/14):  3.3 x 3.4 cm => f/u 11/2013   Amebic dysentery    AMEBIC DYSENTERY 11/19/2007   Qualifier: History of  By: Lenna Gilford MD, Deborra Medina    Anemia 03/07/2017   ANXIETY 11/19/2007   Qualifier: Diagnosis of  By: Lenna Gilford MD, Deborra Medina    Arthritis 03/07/2017   Atherosclerosis of coronary artery bypass graft with unstable angina pectoris ([place]) 04/19/2013   BACK PAIN, LUMBAR 11/16/2007   Qualifier: Diagnosis of  By: Julien Girt CMA, Leigh     Benign prostatic hypertrophy    BENIGN PROSTATIC HYPERTROPHY, HX OF 11/16/2007   Qualifier: Diagnosis of  By: Julien Girt CMA, Leigh     BRBPR (bright red blood per rectum) 11/01/2016   CAD (coronary artery disease)    a. s/p CABG in 1979 and 1993;  b. LHC (5/14):  LM, LAD, CFX and RCA occluded; L-LAD ok, dLAD occluded after insertion of LIMA, S-OM occluded, S-PDA/AM 80-90 => PCI with Promus DES; EF 25%   Cardiomyopathy, ischemic 06/05/2013   Cerumen impaction    Bilateral   Chicken pox as a child   Chronic systolic CHF (congestive heart failure) ([place])    COLONIC POLYPS 07/01/2008   Qualifier: Diagnosis of  By: Lenna Gilford MD, Scott M    Degenerative joint disease    DEGENERATIVE JOINT DISEASE 11/16/2007   Qualifier: Diagnosis of  By: Julien Girt CMA, Marliss Czar  Diverticulosis of colon    DIVERTICULOSIS OF COLON 07/01/2008   Qualifier: Diagnosis of  By: Lenna Gilford MD, Deborra Medina    Double vision 03/07/2017   Essential hypertension 11/16/2007   Qualifier: Diagnosis of  By: Julien Girt CMA, Leigh     Fingernail abnormalities 03/07/2017   FLANK PAIN, RIGHT 02/03/2010   Qualifier: History of  By: Lenna Gilford MD, Deborra Medina    GERD (gastroesophageal reflux disease)    GOUT 11/16/2007   Qualifier: Diagnosis of  By: Julien Girt CMA, Leigh     Hearing loss 11/24/2014   Heart murmur    History of shingles 10/27/2017   Hypercholesterolemia     HYPERCHOLESTEROLEMIA 11/16/2007   Qualifier: Diagnosis of  By: Julien Girt CMA, Leigh     Ischemic cardiomyopathy    a. echo (09/05/13): EF 35%, diffuse HK worsened distal septal, mid/distal inferior and apical region, grade 1 diastolic dysfunction, mild LAE.     Kidney stone 08/17/2011   Loss of hearing    Lumbar back pain    Measles as a child   Medicare annual wellness visit, subsequent 11/24/2014   Sees Dr Delman Cheadle for dermatology Sees Dr Roni Bread of Urology Sees Dr Stanford Breed of cardiology Sees Dr Virginia Rochester of Opthamology No further colonoscopies warranted        Mumps as a child   Nephrolithiasis    Pain in joint, lower leg 07/29/2014   PERIPHERAL VASCULAR DISEASE 11/16/2007   Qualifier: Diagnosis of  By: Julien Girt CMA, Leigh     Peripheral vascular disease ([place])    Rectal bleeding 03/07/2017   Shingles 07/29/2014   Sun-damaged skin 05/31/2014    Past Surgical History:  Procedure Laterality Date   CORONARY ANGIOPLASTY WITH STENT PLACEMENT  04/18/2013   RCA        CORONARY ARTERY BYPASS GRAFT  1979   x4 SVG-DIAG-LAD, SVG-OM-PDA   CORONARY ARTERY BYPASS GRAFT  1993   Redo x5 by Dr Harlow Asa; Durward Fortes, SVG-OM, SVG-AM-PL   Decompressive laminectomy  01/2006   L2 - scarum by Dr. Shellia Carwin   HEMORRHOID SURGERY     fissure with hemorrhoid corrected at age 36   [place]   Right by Dr Lonzo Cloud HERNIA REPAIR  1996   Left by Dr. Harlow Asa   LEFT HEART CATHETERIZATION WITH CORONARY ANGIOGRAM N/A 09/23/2013   Procedure: [place];  Surgeon: Blane Ohara, MD;  Location: [facility] CATH LAB;  Service: Cardiovascular;  Laterality: N/A;   lens implants     for vision correction   PERCUTANEOUS CORONARY STENT INTERVENTION (PCI-S) N/A 04/18/2013   Procedure: PERCUTANEOUS CORONARY STENT INTERVENTION (PCI-S);  Surgeon: Sherren Mocha, MD;  Location: [facility] CATH LAB;  Service: Cardiovascular;  Laterality: N/A;   TONSILLECTOMY      Family History  Problem  Relation Age of Onset   Parkinsonism Brother    Diabetes Maternal Grandmother    Depression Daughter    Other Son        4 stents   Heart disease Son    Diabetes Son        type 2   Colon cancer Neg Hx    Esophageal cancer Neg Hx    Rectal cancer Neg Hx    Stomach cancer Neg Hx     Social History   Socioeconomic History   Marital status: Widowed    Spouse name: [name] x 72 years   Number of children: 7   Years of education: Not on file  Highest education level: Not on file  Occupational History   Occupation: Retired - Former Editor, 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   Other Topics Concern   Not on file  Social History Narrative   Married   7 children   Social Determinants of Health   Financial Resource Strain: Not on file  Food Insecurity: Not on file  Transportation Needs: Not on file  Physical Activity: Not on file  Stress: Not on file  Social Connections: Not on file  Intimate Partner Violence: Not on file    Outpatient Medications Prior to Visit  Medication Sig Dispense Refill   allopurinol (ZYLOPRIM) 300 MG tablet Take 1 tablet (300 mg total) by mouth daily. 90 tablet 1   aspirin EC 81 MG tablet Take 81 mg by mouth every morning.      Cholecalciferol (VITAMIN D-3 PO) Take 5,000 Units by mouth daily with breakfast.     cyanocobalamin (,VITAMIN B-12,) 1000 MCG/ML injection Inject 1,000 mcg into the muscle every 30 (thirty) days.     famotidine (PEPCID) 20 MG tablet Take 1 tablet by mouth twice daily 180  tablet 1   finasteride (PROSCAR) 5 MG tablet Takes every third day     folic acid (FOLVITE) A999333 MCG tablet Take 400 mcg by mouth 2 (two) times daily.     isosorbide mononitrate (IMDUR) 30 MG 24 hr tablet Take 3 tablets by mouth once daily 270 tablet 1   losartan (COZAAR) 50 MG tablet Take 1 tablet by mouth once daily 90 tablet 1   metoprolol succinate (TOPROL-XL) 25 MG 24 hr tablet Take 1/2 (one-half) tablet by mouth once daily 45 tablet 0   Misc Natural Products (OSTEO BI-FLEX ADV JOINT SHIELD) TABS Take 1 tablet by mouth 2 (two) times daily.     Multiple Vitamin (MULTIVITAMIN) tablet Take 1 tablet by mouth daily.     nitroGLYCERIN (NITROSTAT) 0.4 MG SL tablet DISSOLVE ONE TABLET UNDER THE TONGUE EVERY 5 MINUTES AS NEEDED FOR CHEST PAIN.  DO NOT EXCEED A TOTAL OF 3 DOSES IN 15 MINUTES 25 tablet 2   simvastatin (ZOCOR) 40 MG tablet Take 1 tablet (40 mg total) by mouth every evening. 90 tablet 1   No facility-administered medications prior to visit.    Allergies  Allergen Reactions   Lisinopril     REACTION: dizziness   Methocarbamol     REACTION: pt states "dizzy"   Other    Pregabalin     REACTION: pt states "dizzy"   Ramipril     REACTION: hives and dizziness    Review of Systems  Constitutional:  Negative for fever and malaise/fatigue.  HENT:  Negative for congestion.   Eyes:  Negative for blurred vision.  Respiratory:  Negative for shortness of breath.   Cardiovascular:  Negative for chest pain, palpitations and leg swelling.  Gastrointestinal:  Negative for abdominal pain, blood in stool and  nausea.  Genitourinary:  Negative for dysuria and frequency.  Musculoskeletal:  Negative for falls.  Skin:  Negative for rash.  Neurological:  Negative for dizziness, loss of consciousness and headaches.  Endo/Heme/Allergies:  Negative for environmental allergies.  Psychiatric/Behavioral:  Negative for depression. The patient is nervous/anxious.       Objective:    Physical Exam  unable to obtain via phone  BP 133/68   Pulse 62  Wt Readings from Last 3 Encounters:  05/11/21 156 lb (70.8 kg)  04/08/21 157 lb 6 oz (71.4 kg)  03/19/21 158 lb (71.7 kg)    Diabetic Foot Exam - Simple   No data filed    Lab Results  Component Value Date   WBC 8.7 05/11/2021   HGB 12.6 (L) 05/11/2021   HCT 36.8 (L) 05/11/2021   PLT 191.0 05/11/2021   GLUCOSE 94 05/11/2021   CHOL 81 02/02/2021   TRIG 94.0 02/02/2021   HDL 39.80 02/02/2021   LDLCALC 22 02/02/2021   ALT 17 05/11/2021   AST 20 05/11/2021   NA 137 05/11/2021   K 4.3 05/11/2021   CL 106 05/11/2021   CREATININE 1.29 05/11/2021   BUN 31 (H) 05/11/2021   CO2 23 05/11/2021   TSH 1.93 02/02/2021   PSA 25.19 (H) 04/03/2020   INR 1.09 02/04/2018   HGBA1C 5.6 02/02/2021    Lab Results  Component Value Date   TSH 1.93 02/02/2021   Lab Results  Component Value Date   WBC 8.7 05/11/2021   HGB 12.6 (L) 05/11/2021   HCT 36.8 (L) 05/11/2021   MCV 97.7 05/11/2021   PLT 191.0 05/11/2021   Lab Results  Component Value Date   NA 137 05/11/2021   K 4.3 05/11/2021   CO2 23 05/11/2021   GLUCOSE 94 05/11/2021   BUN 31 (H) 05/11/2021   CREATININE 1.29 05/11/2021   BILITOT 1.1 05/11/2021   ALKPHOS 55 05/11/2021   AST 20 05/11/2021   ALT 17 05/11/2021   PROT 6.5 05/11/2021   ALBUMIN 4.3 05/11/2021   CALCIUM 9.4 05/11/2021   ANIONGAP 7 11/30/2019   GFR 47.27 (L) 05/11/2021   Lab Results  Component Value Date   CHOL 81 02/02/2021   Lab Results  Component Value Date   HDL 39.80 02/02/2021   Lab Results  Component Value Date   LDLCALC 22 02/02/2021   Lab Results  Component Value Date   TRIG 94.0 02/02/2021   Lab Results  Component Value Date   CHOLHDL 2 02/02/2021   Lab Results  Component Value Date   HGBA1C 5.6 02/02/2021       Assessment & Plan:   Problem List Items Addressed This Visit     Anxiety state    Patient is struggling with increased anxiety recently. Buspar 5 mg po bid  prn. He is hesitant to commit to taking anything scheduled but is advised that if he has significant increase in anxiety this medicine works best if taken on a scheduled basis. He and his family have been discussing him not driving any longer and he only drives locally very short distances. He is encouraged to stop driving and let his daughter pick up his groceries and medications. Discussed concerns and plan of care for 31 minutes      Relevant Medications   busPIRone (BUSPAR) 5 MG tablet   Essential hypertension    Monitor and report any concerns, no changes to meds. Encouraged heart healthy diet such as the DASH diet and exercise  as tolerated.        I am having Daniel Flakes. [place] "Dick" start on busPIRone. I am also having him maintain his folic acid, Osteo Bi-Flex Adv Joint Shield, Cholecalciferol (VITAMIN D-3 PO), aspirin EC, finasteride, nitroGLYCERIN, multivitamin, simvastatin, allopurinol, famotidine, isosorbide mononitrate, losartan, metoprolol succinate, and cyanocobalamin.  Meds ordered this encounter  Medications   busPIRone (BUSPAR) 5 MG tablet    Sig: Take 1 tablet (5 mg total) by mouth 2 (two) times daily as needed.    Dispense:  60 tablet    Refill:  1     I discussed the assessment and treatment plan with the patient. The patient was provided an opportunity to ask questions and all were answered. The patient agreed with the plan and demonstrated an understanding of the instructions.   The patient was advised to call back or seek an in-person evaluation if the symptoms worsen or if the condition fails to improve as anticipated.  I provided 31 minutes of non-face-to-face time during this encounter.   Penni Homans, MD [facility] [phone] (phone) [phone] (fax)  [place]

## 2021-07-28 NOTE — Assessment & Plan Note (Addendum)
Patient is struggling with increased anxiety recently. Buspar 5 mg po bid prn. He is hesitant to commit to taking anything scheduled but is advised that if he has significant increase in anxiety this medicine works best if taken on a scheduled basis. He and his family have been discussing him not driving any longer and he only drives locally very short distances. He is encouraged to stop driving and let his daughter pick up his groceries and medications. Discussed concerns and plan of care for 31 minutes

## 2021-07-28 NOTE — Telephone Encounter (Signed)
Left message for pt to call, September is the month to recheck the AAA. Unable to do at the [place] office anymore, there is no tech, would need to be scheduled at the [place].

## 2021-07-28 NOTE — Assessment & Plan Note (Signed)
Monitor and report any concerns, no changes to meds. Encouraged heart healthy diet such as the DASH diet and exercise as tolerated.  ?

## 2021-07-29 NOTE — Telephone Encounter (Signed)
Patient is returning call. Please advise? 

## 2021-07-29 NOTE — Telephone Encounter (Signed)
Spoke with pt, he has an appointment 08/11/21 and he will wait and discuss if the AAA scan is needed or not since he can not have the scan in [place]. Directions to the [place] office given to the patient if he gets scheduled for that. He would need to have the appointment in the afternoon wen he has a driver available.

## 2021-08-06 NOTE — Progress Notes (Signed)
HPI: FU CAD; s/p CABG in 1979 and 1993, ischemic CM, systolic CHF, AAA, HTN, HL. Patient underwent cardiac catheterization in May of 2014. The left main, LAD, circumflex and RCA were occluded. The LIMA to the LAD was patent and the distal LAD was occluded after the insertion. Saphenous vein graft to the obtuse marginal was occluded. Saphenous vein graft to the acute marginal and PDA had a high-grade lesion prior to insertion into the PDA of 80-90%. Ejection fraction was 25%. PCI: Promus Premier (3.5x12 mm) DES to the [facility]. Repeat catheterization in October 2014 because of recurrent chest pain. The stent placed in the saphenous vein graft to the PDA had mild in-stent restenosis. Medical therapy recommended. Nuclear study 2/17 showed EF 35, inferolateral scar, no ischemia. Carotid Dopplers February 2018 showed less than 50% bilateral stenosis. Abdominal ultrasound 9/21 showed 4 cm abdominal aortic aneurysm.  Most recent echocardiogram April 2022 showed ejection fraction 25 to 30%, trace aortic insufficiency.  Since he was last seen, patient states he occasionally feels an irregular heartbeat described as a skipped but not sustained.  He denies dyspnea, chest pain, syncope.  Current Outpatient Medications  Medication Sig Dispense Refill   allopurinol (ZYLOPRIM) 300 MG tablet Take 1 tablet (300 mg total) by mouth daily. 90 tablet 1   aspirin EC 81 MG tablet Take 81 mg by mouth every morning.      busPIRone (BUSPAR) 5 MG tablet Take 1 tablet (5 mg total) by mouth 2 (two) times daily as needed. 60 tablet 1   Cholecalciferol (VITAMIN D-3 PO) Take 5,000 Units by mouth daily with breakfast.     cyanocobalamin (,VITAMIN B-12,) 1000 MCG/ML injection Inject 1,000 mcg into the muscle every 30 (thirty) days.     famotidine (PEPCID) 20 MG tablet Take 1 tablet by mouth twice daily 180 tablet 1   finasteride (PROSCAR) 5 MG tablet Takes every third day     folic acid (FOLVITE) A999333 MCG tablet Take 400 mcg by  mouth 2 (two) times daily.     isosorbide mononitrate (IMDUR) 30 MG 24 hr tablet Take 3 tablets by mouth once daily 270 tablet 1   losartan (COZAAR) 50 MG tablet Take 1 tablet by mouth once daily 90 tablet 1   metoprolol succinate (TOPROL-XL) 25 MG 24 hr tablet Take 1/2 (one-half) tablet by mouth once daily 45 tablet 0   Misc Natural Products (OSTEO BI-FLEX ADV JOINT SHIELD) TABS Take 1 tablet by mouth 2 (two) times daily.     Multiple Vitamin (MULTIVITAMIN) tablet Take 1 tablet by mouth daily.     nitroGLYCERIN (NITROSTAT) 0.4 MG SL tablet DISSOLVE ONE TABLET UNDER THE TONGUE EVERY 5 MINUTES AS NEEDED FOR CHEST PAIN.  DO NOT EXCEED A TOTAL OF 3 DOSES IN 15 MINUTES 25 tablet 2   simvastatin (ZOCOR) 40 MG tablet Take 1 tablet (40 mg total) by mouth every evening. 90 tablet 1   No current facility-administered medications for this visit.     Past Medical History:  Diagnosis Date   Abdominal aortic aneurysm ([place])    a. [place] (1/14):  3.3 x 3.4 cm => f/u 11/2013   Amebic dysentery    AMEBIC DYSENTERY 11/19/2007   Qualifier: History of  By: Lenna Gilford MD, Deborra Medina    Anemia 03/07/2017   ANXIETY 11/19/2007   Qualifier: Diagnosis of  By: Lenna Gilford MD, Deborra Medina    Arthritis 03/07/2017   Atherosclerosis of coronary artery bypass graft with unstable angina pectoris ([place])  04/19/2013   BACK PAIN, LUMBAR 11/16/2007   Qualifier: Diagnosis of  By: Julien Girt CMA, Leigh     Benign prostatic hypertrophy    BENIGN PROSTATIC HYPERTROPHY, HX OF 11/16/2007   Qualifier: Diagnosis of  By: Julien Girt CMA, Leigh     BRBPR (bright red blood per rectum) 11/01/2016   CAD (coronary artery disease)    a. s/p CABG in 1979 and 1993;  b. LHC (5/14):  LM, LAD, CFX and RCA occluded; L-LAD ok, dLAD occluded after insertion of LIMA, S-OM occluded, S-PDA/AM 80-90 => PCI with Promus DES; EF 25%   Cardiomyopathy, ischemic 06/05/2013   Cerumen impaction    Bilateral   Chicken pox as a child   Chronic systolic CHF (congestive heart failure) ([place])     COLONIC POLYPS 07/01/2008   Qualifier: Diagnosis of  By: Lenna Gilford MD, Scott M    Degenerative joint disease    DEGENERATIVE JOINT DISEASE 11/16/2007   Qualifier: Diagnosis of  By: Julien Girt CMA, Leigh     Diverticulosis of colon    DIVERTICULOSIS OF COLON 07/01/2008   Qualifier: Diagnosis of  By: Lenna Gilford MD, Deborra Medina    Double vision 03/07/2017   Essential hypertension 11/16/2007   Qualifier: Diagnosis of  By: Julien Girt CMA, Leigh     Fingernail abnormalities 03/07/2017   FLANK PAIN, RIGHT 02/03/2010   Qualifier: History of  By: Lenna Gilford MD, Deborra Medina    GERD (gastroesophageal reflux disease)    GOUT 11/16/2007   Qualifier: Diagnosis of  By: Julien Girt CMA, Leigh     Hearing loss 11/24/2014   Heart murmur    History of shingles 10/27/2017   Hypercholesterolemia    HYPERCHOLESTEROLEMIA 11/16/2007   Qualifier: Diagnosis of  By: Julien Girt CMA, Leigh     Ischemic cardiomyopathy    a. echo (09/05/13): EF 35%, diffuse HK worsened distal septal, mid/distal inferior and apical region, grade 1 diastolic dysfunction, mild LAE.     Kidney stone 08/17/2011   Loss of hearing    Lumbar back pain    Measles as a child   Medicare annual wellness visit, subsequent 11/24/2014   Sees Dr Delman Cheadle for dermatology Sees Dr Roni Bread of Urology Sees Dr Stanford Breed of cardiology Sees Dr Virginia Rochester of Opthamology No further colonoscopies warranted        Mumps as a child   Nephrolithiasis    Pain in joint, lower leg 07/29/2014   PERIPHERAL VASCULAR DISEASE 11/16/2007   Qualifier: Diagnosis of  By: Julien Girt CMA, Leigh     Peripheral vascular disease ([place])    Rectal bleeding 03/07/2017   Shingles 07/29/2014   Sun-damaged skin 05/31/2014    Past Surgical History:  Procedure Laterality Date   CORONARY ANGIOPLASTY WITH STENT PLACEMENT  04/18/2013   RCA        CORONARY ARTERY BYPASS GRAFT  1979   x4 SVG-DIAG-LAD, SVG-OM-PDA   CORONARY ARTERY BYPASS GRAFT  1993   Redo x5 by Dr Harlow Asa; Durward Fortes, SVG-OM, SVG-AM-PL   Decompressive laminectomy  01/2006    L2 - scarum by Dr. Shellia Carwin   HEMORRHOID SURGERY     fissure with hemorrhoid corrected at age 49   [place]   Right by Dr Lonzo Cloud HERNIA REPAIR  1996   Left by Dr. Harlow Asa   LEFT HEART CATHETERIZATION WITH CORONARY ANGIOGRAM N/A 09/23/2013   Procedure: [place];  Surgeon: Blane Ohara, MD;  Location: [facility] CATH LAB;  Service: Cardiovascular;  Laterality: N/A;  lens implants     for vision correction   PERCUTANEOUS CORONARY STENT INTERVENTION (PCI-S) N/A 04/18/2013   Procedure: PERCUTANEOUS CORONARY STENT INTERVENTION (PCI-S);  Surgeon: Sherren Mocha, MD;  Location: [facility] CATH LAB;  Service: Cardiovascular;  Laterality: N/A;   TONSILLECTOMY      Social History   Socioeconomic History   Marital status: Widowed    Spouse name: [name] x 72 years   Number of children: 7   Years of education: Not on file   Highest education level: Not on file  Occupational History   Occupation: Retired - Former Editor, 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   Other Topics Concern   Not on file  Social History Narrative   Married   7 children   Social Determinants of Health   Financial Resource Strain: Not on file  Food Insecurity: Not on file  Transportation Needs: Not on file  Physical Activity: Not on file  Stress: Not on file  Social Connections: Not on file  Intimate Partner Violence: Not on file    Family History  Problem Relation Age of Onset   Parkinsonism  Brother    Diabetes Maternal Grandmother    Depression Daughter    Other Son        4 stents   Heart disease Son    Diabetes Son        type 2   Colon cancer Neg Hx    Esophageal cancer Neg Hx    Rectal cancer Neg Hx    Stomach cancer Neg Hx     ROS: no fevers or chills, productive cough, hemoptysis, dysphasia, odynophagia, melena, hematochezia, dysuria, hematuria, rash, seizure activity, orthopnea, PND, pedal edema, claudication. Remaining systems are negative.  Physical Exam: Well-developed well-nourished in no acute distress.  Skin is warm and dry.  HEENT is normal.  Neck is supple.  Chest is clear to auscultation with normal expansion.  Cardiovascular exam is regular rate and rhythm.  Abdominal exam nontender or distended. No masses palpated. Extremities show no edema. neuro grossly intact  A/P  1 coronary artery disease-patient denies chest pain.  Continue medical therapy with aspirin and statin.  2 ischemic cardiomyopathy-continue beta-blocker and ARB.  3 hypertension-blood pressure is controlled today.  Continue present medical regimen and follow.  4 hyperlipidemia-continue statin.  5 abdominal aortic aneurysm-plan follow-up ultrasound.  6 palpitations-patient is describing an irregular heartbeat at times described as a skip.  He is regular on examination today.  If he has more frequent episodes in the future will plan monitor.  Kirk Ruths, MD

## 2021-08-11 ENCOUNTER — Ambulatory Visit (INDEPENDENT_AMBULATORY_CARE_PROVIDER_SITE_OTHER): Payer: Medicare Other | Admitting: Cardiology

## 2021-08-11 ENCOUNTER — Telehealth: Payer: Self-pay | Admitting: Cardiology

## 2021-08-11 ENCOUNTER — Ambulatory Visit (INDEPENDENT_AMBULATORY_CARE_PROVIDER_SITE_OTHER): Payer: Medicare Other

## 2021-08-11 ENCOUNTER — Encounter: Payer: Self-pay | Admitting: Cardiology

## 2021-08-11 ENCOUNTER — Other Ambulatory Visit: Payer: Self-pay

## 2021-08-11 VITALS — BP 118/64 | HR 56 | Ht 72.0 in | Wt 149.0 lb

## 2021-08-11 DIAGNOSIS — E538 Deficiency of other specified B group vitamins: Secondary | ICD-10-CM

## 2021-08-11 DIAGNOSIS — I2510 Atherosclerotic heart disease of native coronary artery without angina pectoris: Secondary | ICD-10-CM | POA: Diagnosis not present

## 2021-08-11 DIAGNOSIS — I714 Abdominal aortic aneurysm, without rupture, unspecified: Secondary | ICD-10-CM

## 2021-08-11 DIAGNOSIS — R002 Palpitations: Secondary | ICD-10-CM

## 2021-08-11 DIAGNOSIS — I255 Ischemic cardiomyopathy: Secondary | ICD-10-CM

## 2021-08-11 DIAGNOSIS — I10 Essential (primary) hypertension: Secondary | ICD-10-CM | POA: Diagnosis not present

## 2021-08-11 DIAGNOSIS — E7800 Pure hypercholesterolemia, unspecified: Secondary | ICD-10-CM

## 2021-08-11 MED ORDER — CYANOCOBALAMIN 1000 MCG/ML IJ SOLN
1000.0000 ug | Freq: Once | INTRAMUSCULAR | Status: AC
  Administered 2021-08-11: 1000 ug via INTRAMUSCULAR

## 2021-08-11 NOTE — Progress Notes (Signed)
Daniel Reeves is a 87 y.o. male presents to the office today for Monthly B12 injection, per physician's orders. Original order:  02/02/2021 Cyanocobalamin (med), 1000 mcg/ml (dose),  IM (route) was administered L Deltoid (location) today. Patient tolerated injection. Patient due for follow up labs/provider appt: Appointment pending on 09-07-10--22 with Dr Charlett Blake Patient next injection due: that day  Kathlene November, [place], Darlis Loan

## 2021-08-11 NOTE — Telephone Encounter (Signed)
   Pt would like to know what is syngo dynamics mean, he read it on his AVS. Also, he wanted to know where he can get the extra strength gas x

## 2021-08-11 NOTE — Patient Instructions (Signed)
Medication Instructions:   Your physician recommends that you continue on your current medications as directed. Please refer to the Current Medication list given to you today.  *If you need a refill on your cardiac medications before your next appointment, please call your pharmacy*   Lab Work:  -NONE  If you have labs (blood work) drawn today and your tests are completely normal, you will receive your results only by: [place] (if you have MyChart) OR A paper copy in the mail If you have any lab test that is abnormal or we need to change your treatment, we will call you to review the results.   Testing/Procedures: You are scheduled to have a Abdominal Ultra Sound.    Follow-Up: At [facility], you and your health needs are our priority.  As part of our continuing mission to provide you with exceptional heart care, we have created designated Provider Care Teams.  These Care Teams include your primary Cardiologist (physician) and Advanced Practice Providers (APPs -  Physician Assistants and Nurse Practitioners) who all work together to provide you with the care you need, when you need it.  We recommend signing up for the patient portal called "MyChart".  Sign up information is provided on this After Visit Summary.  MyChart is used to connect with patients for Virtual Visits (Telemedicine).  Patients are able to view lab/test results, encounter notes, upcoming appointments, etc.  Non-urgent messages can be sent to your provider as well.   To learn more about what you can do with MyChart, go to [email or web address].    Your next appointment:   6 month(s)  The format for your next appointment:   In Person  Provider:   Dr. Stanford Breed   Other Instructions -None

## 2021-08-16 ENCOUNTER — Other Ambulatory Visit: Payer: Self-pay | Admitting: Family Medicine

## 2021-08-16 DIAGNOSIS — I10 Essential (primary) hypertension: Secondary | ICD-10-CM

## 2021-08-16 DIAGNOSIS — E7800 Pure hypercholesterolemia, unspecified: Secondary | ICD-10-CM

## 2021-08-16 DIAGNOSIS — I25700 Atherosclerosis of coronary artery bypass graft(s), unspecified, with unstable angina pectoris: Secondary | ICD-10-CM

## 2021-08-17 NOTE — Telephone Encounter (Signed)
Left message for pt to call.

## 2021-08-23 NOTE — Telephone Encounter (Signed)
Left message for pt to call.

## 2021-08-24 ENCOUNTER — Telehealth: Payer: Self-pay | Admitting: Cardiology

## 2021-08-24 NOTE — Telephone Encounter (Signed)
Spoke with patient, advised patient of instructions for his AAA duplex. Per [facility] patient does not have to take [place]. Made patient aware and patient verbalized understanding. Advised patient to call back to office with any issues, questions, or concerns. Patient verbalized understanding.

## 2021-08-24 NOTE — Telephone Encounter (Signed)
Patient is calling because he is having a AAA Duplex done tomorrow and wanted someone to explain directions to him before appt

## 2021-08-25 ENCOUNTER — Ambulatory Visit (HOSPITAL_BASED_OUTPATIENT_CLINIC_OR_DEPARTMENT_OTHER)
Admission: RE | Admit: 2021-08-25 | Discharge: 2021-08-25 | Disposition: A | Discharge status: Home / Self Care | Payer: Medicare Other | Source: Ambulatory Visit | Attending: Cardiology | Admitting: Cardiology

## 2021-08-25 ENCOUNTER — Other Ambulatory Visit: Payer: Self-pay

## 2021-08-25 DIAGNOSIS — I10 Essential (primary) hypertension: Secondary | ICD-10-CM | POA: Diagnosis not present

## 2021-08-25 DIAGNOSIS — R002 Palpitations: Secondary | ICD-10-CM | POA: Diagnosis not present

## 2021-08-25 DIAGNOSIS — E7800 Pure hypercholesterolemia, unspecified: Secondary | ICD-10-CM | POA: Diagnosis not present

## 2021-08-25 DIAGNOSIS — I2510 Atherosclerotic heart disease of native coronary artery without angina pectoris: Secondary | ICD-10-CM | POA: Diagnosis not present

## 2021-08-25 DIAGNOSIS — I255 Ischemic cardiomyopathy: Secondary | ICD-10-CM | POA: Insufficient documentation

## 2021-08-25 DIAGNOSIS — I714 Abdominal aortic aneurysm, without rupture, unspecified: Secondary | ICD-10-CM

## 2021-08-30 ENCOUNTER — Encounter: Payer: Self-pay | Admitting: *Deleted

## 2021-09-01 DIAGNOSIS — R972 Elevated prostate specific antigen [PSA]: Secondary | ICD-10-CM | POA: Diagnosis not present

## 2021-09-07 ENCOUNTER — Other Ambulatory Visit: Payer: Self-pay

## 2021-09-07 ENCOUNTER — Other Ambulatory Visit (HOSPITAL_BASED_OUTPATIENT_CLINIC_OR_DEPARTMENT_OTHER): Payer: Self-pay

## 2021-09-07 ENCOUNTER — Ambulatory Visit (INDEPENDENT_AMBULATORY_CARE_PROVIDER_SITE_OTHER): Payer: Medicare Other | Admitting: Family Medicine

## 2021-09-07 VITALS — BP 110/62 | HR 66 | Temp 98.0°F | Resp 16 | Wt 150.8 lb

## 2021-09-07 DIAGNOSIS — D649 Anemia, unspecified: Secondary | ICD-10-CM | POA: Diagnosis not present

## 2021-09-07 DIAGNOSIS — I2583 Coronary atherosclerosis due to lipid rich plaque: Secondary | ICD-10-CM | POA: Diagnosis not present

## 2021-09-07 DIAGNOSIS — I10 Essential (primary) hypertension: Secondary | ICD-10-CM

## 2021-09-07 DIAGNOSIS — M109 Gout, unspecified: Secondary | ICD-10-CM

## 2021-09-07 DIAGNOSIS — I2510 Atherosclerotic heart disease of native coronary artery without angina pectoris: Secondary | ICD-10-CM | POA: Diagnosis not present

## 2021-09-07 DIAGNOSIS — R739 Hyperglycemia, unspecified: Secondary | ICD-10-CM | POA: Diagnosis not present

## 2021-09-07 DIAGNOSIS — F411 Generalized anxiety disorder: Secondary | ICD-10-CM

## 2021-09-07 DIAGNOSIS — E7800 Pure hypercholesterolemia, unspecified: Secondary | ICD-10-CM

## 2021-09-07 DIAGNOSIS — Z23 Encounter for immunization: Secondary | ICD-10-CM | POA: Diagnosis not present

## 2021-09-07 DIAGNOSIS — I255 Ischemic cardiomyopathy: Secondary | ICD-10-CM

## 2021-09-07 DIAGNOSIS — N189 Chronic kidney disease, unspecified: Secondary | ICD-10-CM

## 2021-09-07 MED ORDER — BUSPIRONE HCL 5 MG PO TABS: 5.0000 mg | ORAL_TABLET | Freq: Two times a day (BID) | ORAL | 3 refills | Status: DC | PRN

## 2021-09-07 MED ORDER — INFLUENZA VAC A&B SA ADJ QUAD 0.5 ML IM PRSY
PREFILLED_SYRINGE | INTRAMUSCULAR | 0 refills | Status: DC
  Filled 2021-09-07: qty 0.5, 1d supply, fill #0

## 2021-09-07 NOTE — Progress Notes (Signed)
Patient ID: Daniel Reeves, male    DOB: 1924/01/15  Age: 87 y.o. MRN: [id number]    Subjective:   Chief Complaint  Patient presents with   Follow-up   Subjective   HPI Daniel Reeves presents for office visit today for follow up on HTN and GERD. He is still dealing with stress and at the moment he is take Buspar 5 mg once daily. He noted experiencing palpitations while measuring his pulse. Denies CP/palp/SOB/HA/congestion/fevers/GI or GU c/o. Taking meds as prescribed. He is experiencing right back pain above the hips that he rates 5/10.   Review of Systems  Constitutional:  Negative for chills, fatigue and fever.  HENT:  Negative for congestion, rhinorrhea, sinus pressure, sinus pain and sore throat.   Eyes:  Negative for pain.  Respiratory:  Negative for cough and shortness of breath.   Cardiovascular:  Positive for palpitations. Negative for chest pain and leg swelling.  Gastrointestinal:  Negative for abdominal pain, blood in stool, diarrhea, nausea and vomiting.  Genitourinary:  Negative for flank pain, frequency and penile pain.  Musculoskeletal:  Positive for back pain (local to right above hip).  Neurological:  Negative for headaches.   History Past Medical History:  Diagnosis Date   Abdominal aortic aneurysm ([place])    a. 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 s/p CABG in 1979 and 1993;  b. LHC (5/14):  LM, LAD, CFX and RCA occluded; L-LAD ok, dLAD occluded after insertion of LIMA, S-OM occluded, S-PDA/AM 80-90 => PCI with Promus DES; EF 25%   Cardiomyopathy, ischemic 06/05/2013   Cerumen impaction    Bilateral   Chicken pox as a child   Chronic systolic CHF (congestive heart failure) ([place])    COLONIC POLYPS 07/01/2008   Qualifier: Diagnosis of  By: Lenna Gilford MD, Scott M    Degenerative joint disease    DEGENERATIVE JOINT DISEASE 11/16/2007   Qualifier: Diagnosis of  By: Julien Girt CMA, Leigh     Diverticulosis of colon    DIVERTICULOSIS OF COLON 07/01/2008   Qualifier: Diagnosis of  By: Lenna Gilford MD, Deborra Medina    Double vision 03/07/2017   Essential hypertension 11/16/2007   Qualifier: Diagnosis of  By: Julien Girt CMA, Leigh     Fingernail abnormalities 03/07/2017   FLANK PAIN, RIGHT 02/03/2010   Qualifier: History of  By: Lenna Gilford MD, Deborra Medina    GERD (gastroesophageal reflux disease)    GOUT 11/16/2007   Qualifier: Diagnosis of  By: Julien Girt CMA, Leigh     Hearing loss 11/24/2014   Heart murmur    History of shingles 10/27/2017   Hypercholesterolemia    HYPERCHOLESTEROLEMIA 11/16/2007   Qualifier: Diagnosis of  By: Julien Girt CMA, Leigh     Ischemic cardiomyopathy    a. echo (  09/05/13): EF 35%, diffuse HK worsened distal septal, mid/distal inferior and apical region, grade 1 diastolic dysfunction, mild LAE.     Kidney stone 08/17/2011   Loss of hearing    Lumbar back pain    Measles as a child   Medicare annual wellness visit, subsequent 11/24/2014   Sees Dr Delman Cheadle for dermatology Sees Dr Roni Bread of Urology Sees Dr Stanford Breed of cardiology Sees Dr Virginia Rochester of Opthamology No further colonoscopies warranted        Mumps as a child   Nephrolithiasis    Pain in joint, lower leg 07/29/2014   PERIPHERAL VASCULAR DISEASE 11/16/2007   Qualifier: Diagnosis of  By: Julien Girt CMA, Leigh     Peripheral vascular disease ([place])    Rectal bleeding 03/07/2017   Shingles 07/29/2014    Sun-damaged skin 05/31/2014    He has a past surgical history that includes Inguinal hernia repair (1994); Inguinal hernia repair (1996); Decompressive laminectomy (01/2006); Coronary angioplasty with stent (04/18/2013); Hemorrhoid surgery; Tonsillectomy; percutaneous coronary stent intervention (pci-s) (N/A, 04/18/2013); left heart catheterization with coronary angiogram (N/A, 09/23/2013); Coronary artery bypass graft (1979); Coronary artery bypass graft (1993); and lens implants.   His family history includes Depression in his daughter; Diabetes in his maternal grandmother and son; Heart disease in his son; Other in his son; Parkinsonism in his brother.He reports that he quit smoking about 76 years ago. He has never used smokeless tobacco. He reports current alcohol use of about 2.0 standard drinks per week. He reports that he does not use drugs.  Current Outpatient Medications on File Prior to Visit  Medication Sig Dispense Refill   allopurinol (ZYLOPRIM) 300 MG tablet Take 1 tablet (300 mg total) by mouth daily. 90 tablet 1   aspirin EC 81 MG tablet Take 81 mg by mouth every morning.      Cholecalciferol (VITAMIN D-3 PO) Take 5,000 Units by mouth daily with breakfast.     cyanocobalamin (,VITAMIN B-12,) 1000 MCG/ML injection Inject 1,000 mcg into the muscle every 30 (thirty) days.     famotidine (PEPCID) 20 MG tablet Take 1 tablet by mouth twice daily 180 tablet 1   finasteride (PROSCAR) 5 MG tablet Takes every third day     folic acid (FOLVITE) 831 MCG tablet Take 400 mcg by mouth 2 (two) times daily.     isosorbide mononitrate (IMDUR) 30 MG 24 hr tablet Take 3 tablets by mouth once daily 270 tablet 1   losartan (COZAAR) 50 MG tablet Take 1 tablet by mouth once daily 90 tablet 1   metoprolol succinate (TOPROL-XL) 25 MG 24 hr tablet Take 1/2 (one-half) tablet by mouth once daily 45 tablet 0   Misc Natural Products (OSTEO BI-FLEX ADV JOINT SHIELD) TABS Take 1 tablet by mouth 2 (two) times daily.      Multiple Vitamin (MULTIVITAMIN) tablet Take 1 tablet by mouth daily.     nitroGLYCERIN (NITROSTAT) 0.4 MG SL tablet DISSOLVE ONE TABLET UNDER THE TONGUE EVERY 5 MINUTES AS NEEDED FOR CHEST PAIN.  DO NOT EXCEED A TOTAL OF 3 DOSES IN 15 MINUTES 25 tablet 2   simvastatin (ZOCOR) 40 MG tablet TAKE 1 TABLET BY MOUTH ONCE DAILY IN THE EVENING 90 tablet 1   No current facility-administered medications on file prior to visit.     Objective:  Objective  Physical Exam Constitutional:      General: He is not in acute distress.    Appearance: Normal appearance. He is not ill-appearing or toxic-appearing.  HENT:  Head: Normocephalic and atraumatic.     Right Ear: Tympanic membrane, ear canal and external ear normal.     Left Ear: Tympanic membrane, ear canal and external ear normal.     Nose: No congestion or rhinorrhea.  Eyes:     Extraocular Movements: Extraocular movements intact.     Pupils: Pupils are equal, round, and reactive to light.  Cardiovascular:     Rate and Rhythm: Normal rate and regular rhythm.     Pulses: Normal pulses.     Heart sounds: Normal heart sounds. No murmur heard. Pulmonary:     Effort: Pulmonary effort is normal. No respiratory distress.     Breath sounds: Normal breath sounds. No wheezing, rhonchi or rales.  Abdominal:     General: Bowel sounds are normal.     Palpations: Abdomen is soft. There is no mass.     Tenderness: There is no abdominal tenderness. There is no guarding.     Hernia: No hernia is present.  Musculoskeletal:        General: Normal range of motion.     Cervical back: Normal range of motion and neck supple.  Skin:    General: Skin is warm and dry.  Neurological:     Mental Status: He is alert and oriented to person, place, and time.  Psychiatric:        Behavior: Behavior normal.   BP 110/62   Pulse 66   Temp 98 F (36.7 C)   Resp 16   Wt 150 lb 12.8 oz (68.4 kg)   SpO2 99%   BMI 20.45 kg/m  Wt Readings from Last 3  Encounters:  09/07/21 150 lb 12.8 oz (68.4 kg)  08/11/21 149 lb (67.6 kg)  05/11/21 156 lb (70.8 kg)     Lab Results  Component Value Date   WBC 8.7 05/11/2021   HGB 12.6 (L) 05/11/2021   HCT 36.8 (L) 05/11/2021   PLT 191.0 05/11/2021   GLUCOSE 94 05/11/2021   CHOL 81 02/02/2021   TRIG 94.0 02/02/2021   HDL 39.80 02/02/2021   LDLCALC 22 02/02/2021   ALT 17 05/11/2021   AST 20 05/11/2021   NA 137 05/11/2021   K 4.3 05/11/2021   CL 106 05/11/2021   CREATININE 1.29 05/11/2021   BUN 31 (H) 05/11/2021   CO2 23 05/11/2021   TSH 1.93 02/02/2021   PSA 25.19 (H) 04/03/2020   INR 1.09 02/04/2018   HGBA1C 5.6 02/02/2021    VAS [place] AAA DUPLEX  Result Date: 08/25/2021 ABDOMINAL AORTA STUDY Patient Name:  RIP HAWES [facility]  Date of Exam:   08/25/2021 Medical Rec #: [id number]              Accession #:    [id number] Date of Birth: 03/09/1924              Patient Gender: M Patient Age:   33 years Exam Location:  [place] Procedure:      VAS [place] AAA DUPLEX Referring Phys: Aaron Edelman CRENSHAW --------------------------------------------------------------------------------  Indications: Follow up exam for known AAA. Risk Factors: Hypertension, past history of smoking, coronary artery disease.  Comparison Study: Previous abdominal aorta study performed on 07/30/20 showed                   AAA in distal portion measuring 3.9 x 4.0 cm Performing Technologist: Hollie Walker RDCS, RVT  Examination Guidelines: A complete evaluation includes B-mode imaging, spectral Doppler, color Doppler, and power Doppler as needed of  all accessible portions of each vessel. Bilateral testing is considered an integral part of a complete examination. Limited examinations for reoccurring indications may be performed as noted.  Abdominal Aorta Findings: +-----------+-------+----------+----------+---------+--------+--------+ Location   AP (cm)Trans (cm)PSV (cm/s)Waveform ThrombusComments  +-----------+-------+----------+----------+---------+--------+--------+ Proximal   1.70   1.60      113       triphasic                 +-----------+-------+----------+----------+---------+--------+--------+ Mid        1.60   1.70      97        triphasic                 +-----------+-------+----------+----------+---------+--------+--------+ Distal     4.00   3.90      307       biphasic Present saccular +-----------+-------+----------+----------+---------+--------+--------+ RT CIA Prox1.1    1.0       165       biphasic                  +-----------+-------+----------+----------+---------+--------+--------+ RT EIA Prox1.0    1.0       85        triphasic                 +-----------+-------+----------+----------+---------+--------+--------+ LT CIA Prox1.0    1.0       116       biphasic                  +-----------+-------+----------+----------+---------+--------+--------+ LT EIA Prox0.9    1.1       102       biphasic                  +-----------+-------+----------+----------+---------+--------+--------+ Extensive atherosclerotic plaque in the distal aorta aneurysm. IVC/Iliac Findings: +--------+------+--------+--------+   IVC   PatentThrombusComments +--------+------+--------+--------+ IVC Proxpatent                 +--------+------+--------+--------+    Summary: Abdominal Aorta: There is evidence of abnormal dilatation of the distal Abdominal aorta. The largest aortic measurement is 4.0 cm. The largest aortic diameter remains essentially unchanged compared to prior exam. Previous diameter measurement was 4.0 cm obtained on 07/29/2020. IVC/Iliac: There is no evidence of thrombus involving the IVC.  *See table(s) above for measurements and observations. Suggest follow up study in 12 months.  Electronically signed by Shirlee More MD on 08/25/2021 at 12:34:45 PM.    Final      Assessment & Plan:  Plan    Meds ordered this encounter   Medications   busPIRone (BUSPAR) 5 MG tablet    Sig: Take 1 tablet (5 mg total) by mouth 2 (two) times daily as needed.    Dispense:  60 tablet    Refill:  3    Please make sure his bottles always say for anxiety please     Problem List Items Addressed This Visit     HYPERCHOLESTEROLEMIA - Primary    Tolerating statin, encouraged heart healthy diet, avoid trans fats, minimize simple carbs and saturated fats. Increase exercise as tolerated      Relevant Orders   Lipid panel   Gout    Hydrate and monitor      Relevant Orders   Uric acid   Anxiety state    Using Buspar 5 mg only in am and skips the evening dose so he can      Relevant Medications   busPIRone (BUSPAR) 5  MG tablet   Essential hypertension    Well controlled, no changes to meds. Encouraged heart healthy diet such as the DASH diet and exercise as tolerated.       Relevant Orders   CBC   Comprehensive metabolic panel   TSH   CAD (coronary artery disease)   Anemia   Hyperglycemia    hgba1c acceptable, minimize simple carbs. Increase exercise as tolerated.       Relevant Orders   Hemoglobin A1c   Chronic renal disease    Hydrate and monitor       Follow-up: Return in about 3 months (around 12/08/2021) for f/u visit.  I, Suezanne Jacquet, acting as a scribe for Penni Homans, MD, have documented all relevent documentation on behalf of Penni Homans, MD, as directed by Penni Homans, MD while in the presence of Penni Homans, MD. DO:09/08/21.  I, [name], MD personally performed the services described in this documentation. All medical record entries made by the scribe were at my direction and in my presence. I have reviewed the chart and agree that the record reflects my personal performance and is accurate and complete

## 2021-09-07 NOTE — Assessment & Plan Note (Signed)
Using Buspar 5 mg only in am and skips the evening dose so he can

## 2021-09-07 NOTE — Patient Instructions (Addendum)
-   Apply moist heat and some lidocaine or aspercreme for back pain  - Covid 3rd booster available downstairs (1st floor) at the [facility] 9am-4pm, Monday-Friday. They do accept walk-ins with no appointments.  - Molnupiravir/Paxlovid is the new COVID medication we can give you if you get COVID so make sure you test if you have symptoms because we have to treat by day 5 of symptoms for it to be effective. If you are positive let us know so we can treat. If a home test is negative and your symptoms are persistent get a PCR test. Can check testing locations at [email or web address] If you are positive we will make an appointment with [place] and we will send in molnupiravir/paxlovid if you would like it. Check with your pharmacy before we meet to confirm they have it in stock, if they do not then we can get the prescription at the [facility].

## 2021-09-07 NOTE — Assessment & Plan Note (Signed)
Well controlled, no changes to meds. Encouraged heart healthy diet such as the DASH diet and exercise as tolerated.  °

## 2021-09-08 DIAGNOSIS — R3915 Urgency of urination: Secondary | ICD-10-CM | POA: Diagnosis not present

## 2021-09-08 DIAGNOSIS — R972 Elevated prostate specific antigen [PSA]: Secondary | ICD-10-CM | POA: Diagnosis not present

## 2021-09-08 DIAGNOSIS — N401 Enlarged prostate with lower urinary tract symptoms: Secondary | ICD-10-CM | POA: Diagnosis not present

## 2021-09-08 DIAGNOSIS — R351 Nocturia: Secondary | ICD-10-CM | POA: Diagnosis not present

## 2021-09-08 NOTE — Assessment & Plan Note (Signed)
Hydrate and monitor 

## 2021-09-08 NOTE — Assessment & Plan Note (Signed)
Tolerating statin, encouraged heart healthy diet, avoid trans fats, minimize simple carbs and saturated fats. Increase exercise as tolerated 

## 2021-09-08 NOTE — Assessment & Plan Note (Signed)
hgba1c acceptable, minimize simple carbs. Increase exercise as tolerated.  

## 2021-09-10 ENCOUNTER — Other Ambulatory Visit: Payer: Self-pay

## 2021-09-10 ENCOUNTER — Ambulatory Visit (INDEPENDENT_AMBULATORY_CARE_PROVIDER_SITE_OTHER): Payer: Medicare Other

## 2021-09-10 DIAGNOSIS — E538 Deficiency of other specified B group vitamins: Secondary | ICD-10-CM | POA: Diagnosis not present

## 2021-09-10 MED ORDER — CYANOCOBALAMIN 1000 MCG/ML IJ SOLN
1000.0000 ug | Freq: Once | INTRAMUSCULAR | Status: AC
  Administered 2021-09-10: 1000 ug via INTRAMUSCULAR

## 2021-09-10 NOTE — Progress Notes (Signed)
Daniel Reeves is a 87 y.o. male presents to the office today for Monthly B12 injection, per physician's orders. Original order:  02/02/2021 Cyanocobalamin (med), 1000 mcg/ml (dose),  IM (route) was administered L Deltoid (location) today. Patient tolerated injection. Patient due for follow up labs/provider appt: Return in about 3 months (around 12/08/2021) for f/u visit.  with Dr Charlett Blake Patient next injection due: 1 month.   Dr Nani Ravens- DOD

## 2021-09-17 ENCOUNTER — Telehealth: Payer: Self-pay | Admitting: Family Medicine

## 2021-09-17 ENCOUNTER — Other Ambulatory Visit: Payer: Self-pay | Admitting: Family Medicine

## 2021-09-17 ENCOUNTER — Telehealth: Payer: Self-pay

## 2021-09-17 MED ORDER — BUSPIRONE HCL 5 MG PO TABS: 5.0000 mg | ORAL_TABLET | Freq: Every day | ORAL | 1 refills | Status: DC

## 2021-09-17 NOTE — Telephone Encounter (Signed)
Pt called stating he has to drive his daughter to to a 7:30am dentist appt for major dental work on 11/8.  The problem is that is the time of day for his major bowel elimination period (between 6am-11am).  He would like to receive written instructions and medication to prevent bowel movement during this time period.  Pt would like to pick up medication at the pharmacy here.  Please advise.

## 2021-09-17 NOTE — Telephone Encounter (Signed)
Pt. Called in and stated that he only takes 1 pill daily instead of 2. He wants to know can the prescription be changed to only 1pill daily so that way he doesn't have so many pills leftover.  busPIRone (BUSPAR) 5 MG tablet

## 2021-09-20 DIAGNOSIS — C44311 Basal cell carcinoma of skin of nose: Secondary | ICD-10-CM | POA: Diagnosis not present

## 2021-09-20 DIAGNOSIS — L821 Other seborrheic keratosis: Secondary | ICD-10-CM | POA: Diagnosis not present

## 2021-09-20 DIAGNOSIS — L814 Other melanin hyperpigmentation: Secondary | ICD-10-CM | POA: Diagnosis not present

## 2021-09-20 DIAGNOSIS — L570 Actinic keratosis: Secondary | ICD-10-CM | POA: Diagnosis not present

## 2021-09-20 DIAGNOSIS — X32XXXS Exposure to sunlight, sequela: Secondary | ICD-10-CM | POA: Diagnosis not present

## 2021-09-20 DIAGNOSIS — D485 Neoplasm of uncertain behavior of skin: Secondary | ICD-10-CM | POA: Diagnosis not present

## 2021-09-20 NOTE — Telephone Encounter (Signed)
Pt aware.

## 2021-09-23 ENCOUNTER — Other Ambulatory Visit: Payer: Self-pay | Admitting: Cardiology

## 2021-09-27 ENCOUNTER — Other Ambulatory Visit: Payer: Self-pay | Admitting: Family Medicine

## 2021-10-13 ENCOUNTER — Other Ambulatory Visit: Payer: Self-pay

## 2021-10-13 ENCOUNTER — Ambulatory Visit (INDEPENDENT_AMBULATORY_CARE_PROVIDER_SITE_OTHER): Payer: Medicare Other | Admitting: *Deleted

## 2021-10-13 DIAGNOSIS — E538 Deficiency of other specified B group vitamins: Secondary | ICD-10-CM

## 2021-10-13 MED ORDER — CYANOCOBALAMIN 1000 MCG/ML IJ SOLN
1000.0000 ug | Freq: Once | INTRAMUSCULAR | Status: AC
  Administered 2021-10-13: 1000 ug via INTRAMUSCULAR

## 2021-10-13 NOTE — Progress Notes (Signed)
Daniel Reeves is a 87 y.o. male presents to the office today for Monthly B12 injection, per physician's orders. Original order:  02/02/2021 Cyanocobalamin (med), 1000 mcg/ml (dose),  IM (route) was administered L Deltoid (location) today. Patient tolerated injection. Patient due for follow up labs/provider appt: Return in about 3 months (around 12/08/2021) for f/u visit.  with Dr Charlett Blake Patient next injection due: 1 month.

## 2021-10-29 ENCOUNTER — Telehealth: Payer: Self-pay | Admitting: Family Medicine

## 2021-10-29 NOTE — Telephone Encounter (Signed)
fyi

## 2021-10-29 NOTE — Telephone Encounter (Signed)
Pt. Wanted to inform you that he signed  up for [employer]. He stated the program told him to inform his PCP. He said he will only participate if it is no cost to him, if not he will not participate.

## 2021-10-31 ENCOUNTER — Other Ambulatory Visit: Payer: Self-pay | Admitting: Family Medicine

## 2021-11-03 ENCOUNTER — Telehealth: Payer: Self-pay | Admitting: Family Medicine

## 2021-11-03 NOTE — Telephone Encounter (Signed)
[employer] called to notify they sent a fax over 12/5 for medical necessity form that needs signature for insurance. Please advise.

## 2021-11-03 NOTE — Telephone Encounter (Signed)
Paperwork placed in yellow bin °

## 2021-11-04 NOTE — Telephone Encounter (Signed)
Paperwork faxed °

## 2021-11-06 ENCOUNTER — Other Ambulatory Visit: Payer: Self-pay | Admitting: Family Medicine

## 2021-11-08 DIAGNOSIS — C44311 Basal cell carcinoma of skin of nose: Secondary | ICD-10-CM | POA: Diagnosis not present

## 2021-11-09 NOTE — Telephone Encounter (Signed)
[employer] [place]: [phone] Fax: [phone]  Received fax and form but chart notes didn't include medical notes that qualifies him for products such as history of UTI's or catheters etc. She said you can contact them if you have further questions on what is needed.

## 2021-11-10 NOTE — Telephone Encounter (Signed)
Urology note sent to [facility]

## 2021-11-11 ENCOUNTER — Other Ambulatory Visit: Payer: Self-pay | Admitting: Cardiology

## 2021-11-12 ENCOUNTER — Ambulatory Visit (INDEPENDENT_AMBULATORY_CARE_PROVIDER_SITE_OTHER): Payer: Medicare Other | Admitting: *Deleted

## 2021-11-12 DIAGNOSIS — E538 Deficiency of other specified B group vitamins: Secondary | ICD-10-CM | POA: Diagnosis not present

## 2021-11-12 MED ORDER — CYANOCOBALAMIN 1000 MCG/ML IJ SOLN
1000.0000 ug | Freq: Once | INTRAMUSCULAR | Status: AC
  Administered 2021-11-12: 1000 ug via INTRAMUSCULAR

## 2021-11-12 NOTE — Progress Notes (Signed)
Daniel Reeves is a 87 y.o. male presents to the office today for Monthly B12 injection, per physician's orders.  Original order:  02/02/2021  Cyanocobalamin (med), 1000 mcg/ml (dose),  IM (route) was administered L Deltoid (location) today. Patient tolerated injection.  Patient due for follow up labs/provider appt: Return in about 3 months (around 12/08/2021) for f/u visit.  with Dr Charlett Blake  Patient next injection due: 1 month.  Patient scheduled.

## 2021-12-02 ENCOUNTER — Telehealth: Payer: Self-pay | Admitting: Family Medicine

## 2021-12-02 NOTE — Telephone Encounter (Signed)
Left message for patient to call back and schedule Medicare Annual Wellness Visit (AWV) in office.   If not able to come in office, please offer to do virtually or by telephone.  Left office number and my jabber [phone].  Last AWV:04/23/2020  Please schedule at anytime with Nurse Health Advisor.

## 2021-12-02 NOTE — Telephone Encounter (Signed)
Pt stated he felt he had kidney pain when he was sitting in the car. He stated it no longer hurt but wanted to make an appointment with dr. Charlett Blake. Given his symptoms, triaged him to make sure he could wait until an appointment. Please advise.

## 2021-12-03 ENCOUNTER — Telehealth: Payer: Self-pay | Admitting: *Deleted

## 2021-12-03 ENCOUNTER — Ambulatory Visit (INDEPENDENT_AMBULATORY_CARE_PROVIDER_SITE_OTHER): Payer: Medicare Other

## 2021-12-03 VITALS — Ht 72.0 in | Wt 144.0 lb

## 2021-12-03 DIAGNOSIS — Z0000 Encounter for general adult medical examination without abnormal findings: Secondary | ICD-10-CM | POA: Diagnosis not present

## 2021-12-03 NOTE — Telephone Encounter (Signed)
Contact Type Call Who Is Calling Patient / Member / Family / Caregiver Call Type Triage / Clinical Relationship To Patient Self Return Phone Number [phone] (Primary) Chief Complaint Back Pain - General Reason for Call Symptomatic / Request for Health Information Initial Comment Caller states he was transferred to speak with nurse regarding pain in his back. Translation No Nurse Assessment Nurse: Alyson Ingles, RN, Wells Guiles Date/Time (Eastern Time): 12/02/2021 5:01:05 PM Confirm and document reason for call. If symptomatic, describe symptoms. ---Caller states that he has lower back pain. Noticed when he sat in passenger seat of the car around the belt line of his trousers. Rates pain a 5 on 0/10 scale. Denies fever  12/02/2021 5:23:05 PM SEE PCP WITHIN 3 DAYS Yes Mckenzie, RN, Wells Guiles

## 2021-12-03 NOTE — Progress Notes (Signed)
Subjective:   Daniel Reeves is a 88 y.o. male who presents for Medicare Annual/Subsequent preventive examination.  I connected with [name] today by telephone and verified that I am speaking with the correct person using two identifiers. Location patient: home Location provider: work Persons participating in the virtual visit: patient, Marine scientist.    I discussed the limitations, risks, security and privacy concerns of performing an evaluation and management service by telephone and the availability of in person appointments. I also discussed with the patient that there may be a patient responsible charge related to this service. The patient expressed understanding and verbally consented to this telephonic visit.    Interactive audio and video telecommunications were attempted between this provider and patient, however failed, due to patient having technical difficulties OR patient did not have access to video capability.  We continued and completed visit with audio only.  Some vital signs may be absent or patient reported.   Time Spent with patient on telephone encounter: 60 minutes   Review of Systems     Cardiac Risk Factors include: advanced age (>59men, >20 women);male gender;hypertension;dyslipidemia     Objective:    Today's Vitals   12/03/21 1542 12/03/21 1543  Weight: 144 lb (65.3 kg)   Height: 6' (1.829 m)   PainSc:  5    Body mass index is 19.53 kg/m.  Advanced Directives 12/03/2021 04/23/2020 04/22/2020 02/28/2020 11/30/2019 05/26/2019 07/01/2016  Does Patient Have a Medical Advance Directive? Yes Yes Yes No Yes No Yes  Type of Paramedic of [place];Living will [place];Living will - - [place];Living will - [place];Living will  Does patient want to make changes to medical advance directive? - No - Patient declined No - Patient declined - - - -  Copy of [place] in  Chart? Yes - validated most recent copy scanned in chart (See row information) Yes - validated most recent copy scanned in chart (See row information) - - - - No - copy requested  Would patient like information on creating a medical advance directive? - - - - - - -  Pre-existing out of facility DNR order (yellow form or pink MOST form) - - - - - - -    Current Medications (verified) Outpatient Encounter Medications as of 12/03/2021  Medication Sig   acetaminophen (TYLENOL) 500 MG tablet Take 500 mg by mouth.   aspirin EC 81 MG tablet Take 81 mg by mouth every morning.    b complex vitamins capsule Take 1 capsule by mouth daily.   busPIRone (BUSPAR) 5 MG tablet Take 1 tablet (5 mg total) by mouth daily.   Cholecalciferol (VITAMIN D-3 PO) Take 5,000 Units by mouth daily with breakfast.   cyanocobalamin (,VITAMIN B-12,) 1000 MCG/ML injection Inject 1,000 mcg into the muscle every 30 (thirty) days.   famotidine (PEPCID) 20 MG tablet Take 1 tablet by mouth twice daily   finasteride (PROSCAR) 5 MG tablet Takes every third day   folic acid (FOLVITE) 662 MCG tablet Take 400 mcg by mouth 2 (two) times daily.   ibuprofen (ADVIL) 200 MG tablet Take 200 mg by mouth every 6 (six) hours as needed.   isosorbide mononitrate (IMDUR) 30 MG 24 hr tablet Take 3 tablets by mouth once daily   losartan (COZAAR) 50 MG tablet Take 1 tablet by mouth once daily   metoprolol succinate (TOPROL-XL) 25 MG 24 hr tablet Take 1/2 (one-half) tablet by mouth  once daily   Misc Natural Products (OSTEO BI-FLEX ADV JOINT SHIELD) TABS Take 1 tablet by mouth 2 (two) times daily.   Multiple Vitamin (MULTIVITAMIN) tablet Take 1 tablet by mouth daily.   nitroGLYCERIN (NITROSTAT) 0.4 MG SL tablet DISSOLVE ONE TABLET UNDER THE TONGUE EVERY 5 MINUTES AS NEEDED FOR CHEST PAIN.  DO NOT EXCEED A TOTAL OF 3 DOSES IN 15 MINUTES   simvastatin (ZOCOR) 40 MG tablet TAKE 1 TABLET BY MOUTH ONCE DAILY IN THE EVENING   [DISCONTINUED] allopurinol  (ZYLOPRIM) 300 MG tablet Take 1 tablet by mouth once daily   influenza vaccine adjuvanted (FLUAD) 0.5 ML injection Inject into the muscle. (Patient not taking: Reported on 12/03/2021)   No facility-administered encounter medications on file as of 12/03/2021.    Allergies (verified) Lisinopril, Methocarbamol, Other, Pregabalin, and Ramipril   History: Past Medical History:  Diagnosis Date   Abdominal aortic aneurysm    a. 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 s/p CABG in 1979 and 1993;  b. LHC (5/14):  LM, LAD, CFX and RCA occluded; L-LAD ok, dLAD occluded after insertion of LIMA, S-OM occluded, S-PDA/AM 80-90 => PCI with Promus DES; EF 25%   Cardiomyopathy, ischemic 06/05/2013   Cerumen impaction    Bilateral   Chicken pox as a child   Chronic systolic CHF (congestive heart failure) ([place])    COLONIC POLYPS 07/01/2008   Qualifier: Diagnosis of  By: Lenna Gilford MD, Scott M    Degenerative joint disease    DEGENERATIVE JOINT DISEASE 11/16/2007   Qualifier: Diagnosis of  By: Julien Girt CMA, Leigh     Diverticulosis of colon    DIVERTICULOSIS OF COLON 07/01/2008   Qualifier: Diagnosis of  By: Lenna Gilford MD, Deborra Medina    Double vision 03/07/2017   Essential hypertension 11/16/2007   Qualifier: Diagnosis of  By: Julien Girt CMA, Leigh     Fingernail abnormalities 03/07/2017   FLANK PAIN, RIGHT 02/03/2010    Qualifier: History of  By: Lenna Gilford MD, Deborra Medina    GERD (gastroesophageal reflux disease)    GOUT 11/16/2007   Qualifier: Diagnosis of  By: Julien Girt CMA, Leigh     Hearing loss 11/24/2014   Heart murmur    History of shingles 10/27/2017   Hypercholesterolemia    HYPERCHOLESTEROLEMIA 11/16/2007   Qualifier: Diagnosis of  By: Julien Girt CMA, Leigh     Ischemic cardiomyopathy    a. echo (09/05/13): EF 35%, diffuse HK worsened distal septal, mid/distal inferior and apical region, grade 1 diastolic dysfunction, mild LAE.     Kidney stone 08/17/2011   Loss of hearing    Lumbar back pain    Measles as a child   Medicare annual wellness visit, subsequent 11/24/2014   Sees Dr Delman Cheadle for dermatology Sees Dr Roni Bread  of Urology Sees Dr Stanford Breed of cardiology Sees Dr Virginia Rochester of Opthamology No further colonoscopies warranted        Mumps as a child   Nephrolithiasis    Pain in joint, lower leg 07/29/2014   PERIPHERAL VASCULAR DISEASE 11/16/2007   Qualifier: Diagnosis of  By: Julien Girt CMA, Leigh     Peripheral vascular disease ([place])    Rectal bleeding 03/07/2017   Shingles 07/29/2014   Sun-damaged skin 05/31/2014   Past Surgical History:  Procedure Laterality Date   CORONARY ANGIOPLASTY WITH STENT PLACEMENT  04/18/2013   RCA        CORONARY ARTERY BYPASS GRAFT  1979   x4 SVG-DIAG-LAD, SVG-OM-PDA   CORONARY ARTERY BYPASS GRAFT  1993   Redo x5 by Dr Harlow Asa; Durward Fortes, SVG-OM, SVG-AM-PL   Decompressive laminectomy  01/2006   L2 - scarum by Dr. Shellia Carwin   HEMORRHOID SURGERY     fissure with hemorrhoid corrected at age 85   [place]   Right by Dr Lonzo Cloud HERNIA REPAIR  1996   Left by Dr. Harlow Asa   LEFT HEART CATHETERIZATION WITH CORONARY ANGIOGRAM N/A 09/23/2013   Procedure: LEFT HEART CATHETERIZATION WITH CORONARY ANGIOGRAM;  Surgeon: Blane Ohara, MD;  Location: [facility] CATH LAB;  Service: Cardiovascular;  Laterality: N/A;   lens implants     for vision correction   PERCUTANEOUS  CORONARY STENT INTERVENTION (PCI-S) N/A 04/18/2013   Procedure: PERCUTANEOUS CORONARY STENT INTERVENTION (PCI-S);  Surgeon: Sherren Mocha, MD;  Location: [facility] C[facility] LAB;  Service: Cardiovascular;  Laterality: N/A;   TONSILLECTOMY     Family History  Problem Relation Age of Onset   Parkinsonism Brother    Diabetes Maternal Grandmother    Depression Daughter    Other Son        4 stents   Heart disease Son    Diabetes Son        type 2   Colon cancer Neg Hx    Esophageal cancer Neg Hx    Rectal cancer Neg Hx    Stomach cancer Neg Hx    Social History   Socioeconomic History   Marital status: Widowed    Spouse name: [name] x 72 years   Number of children: 7   Years of education: Not on file   Highest education level: Not on file  Occupational History   Occupation: Retired - Former Editor, 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   Other Topics Concern   Not on file  Social History Narrative   Married   7 children   Social Determinants of Health   Financial Resource Strain: Low Risk    Difficulty of Paying Living Expenses: Not hard at all  Food Insecurity: No Food Insecurity   Worried About Charity fundraiser in the Last Year: Never true   Arboriculturist in the Last Year: Never true  Transportation Needs: No Transportation Needs  Lack of Transportation (Medical): No   Lack of Transportation (Non-Medical): No  Physical Activity: Inactive   Days of Exercise per Week: 0 days   Minutes of Exercise per Session: 0 min  Stress: No Stress  Concern Present   Feeling of Stress : Not at all  Social Connections: Moderately Isolated   Frequency of Communication with Friends and Family: More than three times a week   Frequency of Social Gatherings with Friends and Family: More than three times a week   Attends Religious Services: 1 to 4 times per year   Active Member of [employer] or Organizations: No   Attends Archivist Meetings: Never   Marital Status: Widowed    Tobacco Counseling Counseling given: Not Answered   Clinical Intake:  Pre-visit preparation completed: Yes  Pain : 0-10 Pain Score: 5  Pain Type: Chronic pain Pain Location: Back Pain Orientation: Right Pain Onset: More than a month ago Pain Frequency: Constant     BMI - recorded: 19.24 Nutritional Status: BMI of 19-24  Normal Nutritional Risks: Unintentional weight loss Diabetes: No  How often do you need to have someone help you when you read instructions, pamphlets, or other written materials from your doctor or pharmacy?: 1 - Never  Diabetic?No  Interpreter Needed?: No  Information entered by :: Caroleen Hamman LPN   Activities of Daily Living In your present state of health, do you have any difficulty performing the following activities: 12/03/2021  Hearing? Y  Vision? N  Difficulty concentrating or making decisions? N  Walking or climbing stairs? N  Dressing or bathing? N  Doing errands, shopping? N  Preparing Food and eating ? N  Using the Toilet? N  In the past six months, have you accidently leaked urine? Y  Do you have problems with loss of bowel control? Y  Managing your Medications? N  Managing your Finances? N  Housekeeping or managing your Housekeeping? N  Some recent data might be hidden    Patient Care Team: [name], MD as PCP - General (Family Medicine) Irine Seal, MD as Consulting Physician (Urology) Stanford Breed Denice Bors, MD as Consulting Physician (Cardiology) Iona Beard, [place] as Consulting Physician  (Optometry) Arlyn Leak, MD (Dentistry)  Indicate any recent Medical Services you may have received from other than [facility] providers in the past year (date may be approximate).     Assessment:   This is a routine wellness examination for Daniel Reeves.  Hearing/Vision screen Hearing Screening - Comments:: C/o of hearing loss-[facility] Vision Screening - Comments:: Last eye exam-[facility]-more than a year ago  Dietary issues and exercise activities discussed: Current Exercise Habits: The patient does not participate in regular exercise at present   Goals Addressed               This Visit's Progress     Patient Stated     Patient Stated (pt-stated)   On track     Maintain current state of health.       Depression Screen PHQ 2/9 Scores 12/03/2021 05/11/2021 04/23/2020 10/27/2017 07/01/2016 11/24/2014 11/24/2014  PHQ - 2 Score 0 0 1 0 0 0 0    Fall Risk Fall Risk  12/03/2021 02/02/2021 04/23/2020 10/16/2018 11/02/2017  Falls in the past year? 0 0 0 1 No  Comment - - - [facility]i Telephone Survey: data to providers prior to load [employer] Survey: data to providers prior to load  Number  falls in past yr: 0 0 0 1 -  Comment - - - Emmi Telephone Survey Actual Response = 1 -  Injury with Fall? 0 0 0 0 -  Risk for fall due to : - - - - -  Follow up Falls prevention discussed Falls evaluation completed Education provided;Falls prevention discussed - -    FALL RISK PREVENTION PERTAINING TO THE HOME:  Any stairs in or around the home? Yes  If so, are there any without handrails? No  Home free of loose throw rugs in walkways, pet beds, electrical cords, etc? No patient advised of fall hazard Adequate lighting in your home to reduce risk of falls? Yes   ASSISTIVE DEVICES UTILIZED TO PREVENT FALLS:  Life alert? No  Use of a cane, walker or w/c? Yes cane occasionally Grab bars in the bathroom? No  Shower chair or bench in shower? Yes  Elevated toilet  seat or a handicapped toilet? Yes   TIMED UP AND GO:  Was the test performed? No . Phone visit  Cognitive Function:Normal cognitive status assessed by this Nurse Health Advisor. No abnormalities found.   MMSE - Mini Mental State Exam 07/01/2016  Orientation to time 4  Orientation to Place 5  Registration 3  Attention/ Calculation 5  Recall 3  Language- name 2 objects 2  Language- repeat 1  Language- follow 3 step command 3  Language- read & follow direction 1  Write a sentence 1  Copy design 1  Total score 29        Immunizations Immunization History  Administered Date(s) Administered   Fluad Quad(high Dose 65+) 08/13/2019, 11/03/2020, 09/07/2021   Influenza, High Dose Seasonal PF 08/25/2016, 09/04/2017, 10/03/2018   Influenza,inj,Quad PF,6+ Mos 10/10/2013, 11/24/2014, 11/16/2015   PFIZER(Purple Top)SARS-COV-2 Vaccination 01/30/2020, 02/26/2020, 10/19/2020   Pneumococcal Conjugate-13 11/16/2015   Pneumococcal Polysaccharide-23 07/11/2013   Tdap 07/11/2013, 04/26/2019   Zoster, Live 12/17/2015    TDAP status: Up to date  Flu Vaccine status: Up to date  Pneumococcal vaccine status: Up to date  Covid-19 vaccine status: Information provided on how to obtain vaccines.   Qualifies for Shingles Vaccine? Yes   Zostavax completed Yes   Shingrix Completed?: No.    Education has been provided regarding the importance of this vaccine. Patient has been advised to call insurance company to determine out of pocket expense if they have not yet received this vaccine. Advised may also receive vaccine at local pharmacy or Health Dept. Verbalized acceptance and understanding.  Screening Tests Health Maintenance  Topic Date Due   Zoster Vaccines- Shingrix (1 of 2) Never done   COVID-19 Vaccine (4 - Booster for Pfizer series) 12/14/2020   TETANUS/TDAP  04/25/2029   Pneumonia Vaccine 42+ Years old  Completed   INFLUENZA VACCINE  Completed   HPV VACCINES  Aged Out    Health  Maintenance  Health Maintenance Due  Topic Date Due   Zoster Vaccines- Shingrix (1 of 2) Never done   COVID-19 Vaccine (4 - Booster for Pfizer series) 12/14/2020    Colorectal cancer screening: No longer required.   Lung Cancer Screening: (Low Dose CT Chest recommended if Age 70-80 years, 30 pack-year currently smoking OR have quit w/in 15years.) does not qualify.     Additional Screening:  Hepatitis C Screening: does not qualify  Vision Screening: Recommended annual ophthalmology exams for early detection of glaucoma and other disorders of the eye. Is the patient up to date with their annual eye exam?  No  Who is the provider or what is the name of the office in which the patient attends annual eye exams? [facility]-Patient advised to make an appt   Dental Screening: Recommended annual dental exams for proper oral hygiene  Community Resource Referral / Chronic Care Management: CRR required this visit?  No   CCM required this visit?  No      Plan:     I have personally reviewed and noted the following in the patients chart:   Medical and social history Use of alcohol, tobacco or illicit drugs  Current medications and supplements including opioid prescriptions. Patient is not currently taking opioid prescriptions. Functional ability and status Nutritional status Physical activity Advanced directives List of other physicians Hospitalizations, surgeries, and ER visits in previous 12 months Vitals Screenings to include cognitive, depression, and falls Referrals and appointments  In addition, I have reviewed and discussed with patient certain preventive protocols, quality metrics, and best practice recommendations. A written personalized care plan for preventive services as well as general preventive health recommendations were provided to patient.   Due to this being a telephonic visit, the after visit summary with patients personalized plan was offered to patient via  mail or my-chart. Per request, patient was mailed a copy of AVS.   Marta Antu, LPN   [id number]  Nurse Health Advisor  Nurse Notes: None

## 2021-12-03 NOTE — Patient Instructions (Signed)
[name] , Thank you for taking time to complete your Medicare Wellness Visit. I appreciate your ongoing commitment to your health goals. Please review the following plan we discussed and let me know if I can assist you in the future.   Screening recommendations/referrals: Colonoscopy: No longer required Recommended yearly ophthalmology/optometry visit for glaucoma screening and checkup Recommended yearly dental visit for hygiene and checkup  Vaccinations: Influenza vaccine: Up to date Pneumococcal vaccine: Up to date Tdap vaccine: Up to date Shingles vaccine: Discuss with pharmacy   Covid-19: Booster available at the pharmacy  Advanced directives: Copy in chart  Conditions/risks identified: See problem list  Next appointment: Follow up in one year for your annual wellness visit.   Preventive Care 86 Years and Older, Male Preventive care refers to lifestyle choices and visits with your health care provider that can promote health and wellness. What does preventive care include? A yearly physical exam. This is also called an annual well check. Dental exams once or twice a year. Routine eye exams. Ask your health care provider how often you should have your eyes checked. Personal lifestyle choices, including: Daily care of your teeth and gums. Regular physical activity. Eating a healthy diet. Avoiding tobacco and drug use. Limiting alcohol use. Practicing safe sex. Taking low doses of aspirin every day. Taking vitamin and mineral supplements as recommended by your health care provider. What happens during an annual well check? The services and screenings done by your health care provider during your annual well check will depend on your age, overall health, lifestyle risk factors, and family history of disease. Counseling  Your health care provider may ask you questions about your: Alcohol use. Tobacco use. Drug use. Emotional well-being. Home and relationship  well-being. Sexual activity. Eating habits. History of falls. Memory and ability to understand (cognition). Work and work Statistician. Screening  You may have the following tests or measurements: Height, weight, and BMI. Blood pressure. Lipid and cholesterol levels. These may be checked every 5 years, or more frequently if you are over 18 years old. Skin check. Lung cancer screening. You may have this screening every year starting at age 86 if you have a 30-pack-year history of smoking and currently smoke or have quit within the past 15 years. Fecal occult blood test (FOBT) of the stool. You may have this test every year starting at age 86. Flexible sigmoidoscopy or colonoscopy. You may have a sigmoidoscopy every 5 years or a colonoscopy every 10 years starting at age 59. Prostate cancer screening. Recommendations will vary depending on your family history and other risks. Hepatitis C blood test. Hepatitis B blood test. Sexually transmitted disease (STD) testing. Diabetes screening. This is done by checking your blood sugar (glucose) after you have not eaten for a while (fasting). You may have this done every 1-3 years. Abdominal aortic aneurysm (AAA) screening. You may need this if you are a current or former smoker. Osteoporosis. You may be screened starting at age 86 if you are at high risk. Talk with your health care provider about your test results, treatment options, and if necessary, the need for more tests. Vaccines  Your health care provider may recommend certain vaccines, such as: Influenza vaccine. This is recommended every year. Tetanus, diphtheria, and acellular pertussis (Tdap, Td) vaccine. You may need a Td booster every 10 years. Zoster vaccine. You may need this after age 86. Pneumococcal 13-valent conjugate (PCV13) vaccine. One dose is recommended after age 86. Pneumococcal polysaccharide (PPSV23) vaccine. One dose  is recommended after age 86. Talk to your health care  provider about which screenings and vaccines you need and how often you need them. This information is not intended to replace advice given to you by your health care provider. Make sure you discuss any questions you have with your health care provider. Document Released: 12/11/2015 Document Revised: 08/03/2016 Document Reviewed: 09/15/2015 Elsevier Interactive Patient Education  2017 [place] Prevention in the Home Falls can cause injuries. They can happen to people of all ages. There are many things you can do to make your home safe and to help prevent falls. What can I do on the outside of my home? Regularly fix the edges of walkways and driveways and fix any cracks. Remove anything that might make you trip as you walk through a door, such as a raised step or threshold. Trim any bushes or trees on the path to your home. Use bright outdoor lighting. Clear any walking paths of anything that might make someone trip, such as rocks or tools. Regularly check to see if handrails are loose or broken. Make sure that both sides of any steps have handrails. Any raised decks and porches should have guardrails on the edges. Have any leaves, snow, or ice cleared regularly. Use sand or salt on walking paths during winter. Clean up any spills in your garage right away. This includes oil or grease spills. What can I do in the bathroom? Use night lights. Install grab bars by the toilet and in the tub and shower. Do not use towel bars as grab bars. Use non-skid mats or decals in the tub or shower. If you need to sit down in the shower, use a plastic, non-slip stool. Keep the floor dry. Clean up any water that spills on the floor as soon as it happens. Remove soap buildup in the tub or shower regularly. Attach bath mats securely with double-sided non-slip rug tape. Do not have throw rugs and other things on the floor that can make you trip. What can I do in the bedroom? Use night lights. Make  sure that you have a light by your bed that is easy to reach. Do not use any sheets or blankets that are too big for your bed. They should not hang down onto the floor. Have a firm chair that has side arms. You can use this for support while you get dressed. Do not have throw rugs and other things on the floor that can make you trip. What can I do in the kitchen? Clean up any spills right away. Avoid walking on wet floors. Keep items that you use a lot in easy-to-reach places. If you need to reach something above you, use a strong step stool that has a grab bar. Keep electrical cords out of the way. Do not use floor polish or wax that makes floors slippery. If you must use wax, use non-skid floor wax. Do not have throw rugs and other things on the floor that can make you trip. What can I do with my stairs? Do not leave any items on the stairs. Make sure that there are handrails on both sides of the stairs and use them. Fix handrails that are broken or loose. Make sure that handrails are as long as the stairways. Check any carpeting to make sure that it is firmly attached to the stairs. Fix any carpet that is loose or worn. Avoid having throw rugs at the top or bottom of the stairs.  If you do have throw rugs, attach them to the floor with carpet tape. Make sure that you have a light switch at the top of the stairs and the bottom of the stairs. If you do not have them, ask someone to add them for you. What else can I do to help prevent falls? Wear shoes that: Do not have high heels. Have rubber bottoms. Are comfortable and fit you well. Are closed at the toe. Do not wear sandals. If you use a stepladder: Make sure that it is fully opened. Do not climb a closed stepladder. Make sure that both sides of the stepladder are locked into place. Ask someone to hold it for you, if possible. Clearly mark and make sure that you can see: Any grab bars or handrails. First and last steps. Where the  edge of each step is. Use tools that help you move around (mobility aids) if they are needed. These include: Canes. Walkers. Scooters. Crutches. Turn on the lights when you go into a dark area. Replace any light bulbs as soon as they burn out. Set up your furniture so you have a clear path. Avoid moving your furniture around. If any of your floors are uneven, fix them. If there are any pets around you, be aware of where they are. Review your medicines with your doctor. Some medicines can make you feel dizzy. This can increase your chance of falling. Ask your doctor what other things that you can do to help prevent falls. This information is not intended to replace advice given to you by your health care provider. Make sure you discuss any questions you have with your health care provider. Document Released: 09/10/2009 Document Revised: 04/21/2016 Document Reviewed: 12/19/2014 Elsevier Interactive Patient Education  2017 [employer].

## 2021-12-03 NOTE — Telephone Encounter (Signed)
Pt declined labs

## 2021-12-08 DIAGNOSIS — R972 Elevated prostate specific antigen [PSA]: Secondary | ICD-10-CM | POA: Diagnosis not present

## 2021-12-08 LAB — PSA: PSA: 25.4

## 2021-12-10 ENCOUNTER — Telehealth: Payer: Self-pay | Admitting: Family Medicine

## 2021-12-10 NOTE — Telephone Encounter (Signed)
Pt called and stated he has never used any of the materials that came with his catheter. He was wondering if there was any way he could give this to Dr. Charlett Blake or his urologist, and they could put all of these items to use. He stated he has no use for the 90 catheters, clamp, bedside, or the packet that came with all of this. Daughter stated it is okay to leave a message on his phone if he does not pick up. Please advise.

## 2021-12-10 NOTE — Telephone Encounter (Signed)
Spoke with pt let him know that we would have no use for those things here at the office and call urology to see if they need them

## 2021-12-14 ENCOUNTER — Ambulatory Visit (INDEPENDENT_AMBULATORY_CARE_PROVIDER_SITE_OTHER): Payer: Medicare Other

## 2021-12-14 DIAGNOSIS — E538 Deficiency of other specified B group vitamins: Secondary | ICD-10-CM | POA: Diagnosis not present

## 2021-12-14 MED ORDER — CYANOCOBALAMIN 1000 MCG/ML IJ SOLN
1000.0000 ug | Freq: Once | INTRAMUSCULAR | Status: AC
  Administered 2021-12-14: 1000 ug via INTRAMUSCULAR

## 2021-12-14 NOTE — Progress Notes (Signed)
Pt here for monthly B12 injection per PCP order.   B12 1032mcg given IM in left deltoid, and pt tolerated injection well.  Next B12 injection scheduled for one month.   Patient reviewed results and provider comments via [place].

## 2021-12-22 DIAGNOSIS — Z0283 Encounter for blood-alcohol and blood-drug test: Secondary | ICD-10-CM | POA: Diagnosis not present

## 2021-12-22 DIAGNOSIS — Z20822 Contact with and (suspected) exposure to covid-19: Secondary | ICD-10-CM | POA: Diagnosis not present

## 2022-01-04 ENCOUNTER — Ambulatory Visit (INDEPENDENT_AMBULATORY_CARE_PROVIDER_SITE_OTHER): Payer: Medicare Other | Admitting: Family Medicine

## 2022-01-04 ENCOUNTER — Encounter: Payer: Self-pay | Admitting: Family Medicine

## 2022-01-04 VITALS — BP 106/58 | HR 63 | Temp 97.7°F | Resp 16 | Wt 155.6 lb

## 2022-01-04 DIAGNOSIS — I10 Essential (primary) hypertension: Secondary | ICD-10-CM | POA: Diagnosis not present

## 2022-01-04 DIAGNOSIS — R739 Hyperglycemia, unspecified: Secondary | ICD-10-CM

## 2022-01-04 DIAGNOSIS — D649 Anemia, unspecified: Secondary | ICD-10-CM | POA: Diagnosis not present

## 2022-01-04 DIAGNOSIS — N189 Chronic kidney disease, unspecified: Secondary | ICD-10-CM | POA: Diagnosis not present

## 2022-01-04 DIAGNOSIS — C4490 Unspecified malignant neoplasm of skin, unspecified: Secondary | ICD-10-CM | POA: Diagnosis not present

## 2022-01-04 DIAGNOSIS — E7800 Pure hypercholesterolemia, unspecified: Secondary | ICD-10-CM

## 2022-01-04 LAB — COMPREHENSIVE METABOLIC PANEL
ALT: 17 U/L (ref 0–53)
AST: 27 U/L (ref 0–37)
Albumin: 4.1 g/dL (ref 3.5–5.2)
Alkaline Phosphatase: 58 U/L (ref 39–117)
BUN: 28 mg/dL — ABNORMAL HIGH (ref 6–23)
CO2: 27 mEq/L (ref 19–32)
Calcium: 9.7 mg/dL (ref 8.4–10.5)
Chloride: 104 mEq/L (ref 96–112)
Creatinine, Ser: 1.38 mg/dL (ref 0.40–1.50)
GFR: 43.4 mL/min — ABNORMAL LOW (ref >60.00)
Glucose, Bld: 89 mg/dL (ref 70–99)
Potassium: 4.8 mEq/L (ref 3.5–5.1)
Sodium: 137 mEq/L (ref 135–145)
Total Bilirubin: 1.3 mg/dL — ABNORMAL HIGH (ref 0.2–1.2)
Total Protein: 6.3 g/dL (ref 6.0–8.3)

## 2022-01-04 LAB — LIPID PANEL
Cholesterol: 99 mg/dL (ref 0–200)
HDL: 41.5 mg/dL (ref >39.00)
LDL Cholesterol: 38 mg/dL (ref 0–99)
NonHDL: 57.59
Total CHOL/HDL Ratio: 2
Triglycerides: 97 mg/dL (ref 0.0–149.0)
VLDL: 19.4 mg/dL (ref 0.0–40.0)

## 2022-01-04 LAB — CBC
HCT: 37.2 % — ABNORMAL LOW (ref 39.0–52.0)
Hemoglobin: 12.3 g/dL — ABNORMAL LOW (ref 13.0–17.0)
MCHC: 33.2 g/dL (ref 30.0–36.0)
MCV: 100.1 fl — ABNORMAL HIGH (ref 78.0–100.0)
Platelets: 188 10*3/uL (ref 150.0–400.0)
RBC: 3.71 Mil/uL — ABNORMAL LOW (ref 4.22–5.81)
RDW: 14.2 % (ref 11.5–15.5)
WBC: 8.3 10*3/uL (ref 4.0–10.5)

## 2022-01-04 LAB — IRON,TIBC AND FERRITIN PANEL
%SAT: 51 % (calc) — ABNORMAL HIGH (ref 20–48)
Ferritin: 115 ng/mL (ref 24–380)
Iron: 132 ug/dL (ref 50–180)
TIBC: 261 mcg/dL (calc) (ref 250–425)

## 2022-01-04 LAB — TSH: TSH: 1.67 u[IU]/mL (ref 0.35–5.50)

## 2022-01-04 LAB — HEMOGLOBIN A1C: Hgb A1c MFr Bld: 5.8 % (ref 4.6–6.5)

## 2022-01-04 NOTE — Patient Instructions (Signed)

## 2022-01-07 ENCOUNTER — Telehealth: Payer: Self-pay

## 2022-01-07 NOTE — Telephone Encounter (Signed)
Pt's PSA results was 25.40 from urology

## 2022-01-10 DIAGNOSIS — Z20822 Contact with and (suspected) exposure to covid-19: Secondary | ICD-10-CM | POA: Diagnosis not present

## 2022-01-10 DIAGNOSIS — C4490 Unspecified malignant neoplasm of skin, unspecified: Secondary | ICD-10-CM | POA: Insufficient documentation

## 2022-01-10 NOTE — Assessment & Plan Note (Signed)
Hydrate and monitor 

## 2022-01-10 NOTE — Assessment & Plan Note (Signed)
Encourage heart healthy diet such as MIND or DASH diet, increase exercise, avoid trans fats, simple carbohydrates and processed foods, consider a krill or fish or flaxseed oil cap daily.  °

## 2022-01-10 NOTE — Telephone Encounter (Signed)
LVM to call back.

## 2022-01-10 NOTE — Assessment & Plan Note (Signed)
Well controlled, no changes to meds. Encouraged heart healthy diet such as the DASH diet and exercise as tolerated.  °

## 2022-01-10 NOTE — Progress Notes (Signed)
Subjective:    Patient ID: Daniel Reeves, male    DOB: 11/19/1924, 88 y.o.   MRN: [id number]  Chief Complaint  Patient presents with   Follow-up    HPI Patient is in today for follow up on chronic medical concerns.no recent febrile illness or hospitalizations. He is accompanied by family and is doing well. He continues to live at home. Is maintaining a heart healthy diet. He stays active. Denies CP/palp/SOB/HA/congestion/fevers/GI or GU c/o. Taking meds as prescribed   Past Medical History:  Diagnosis Date   Abdominal aortic aneurysm    a. 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 s/p CABG in 1979 and 1993;  b. LHC (5/14):  LM, LAD, CFX and RCA occluded; L-LAD ok, dLAD occluded after insertion of LIMA, S-OM occluded, S-PDA/AM 80-90 => PCI with Promus DES; EF 25%   Cardiomyopathy, ischemic 06/05/2013   Cerumen impaction    Bilateral   Chicken pox as a child   Chronic systolic CHF (congestive heart failure) ([place])    COLONIC POLYPS 07/01/2008   Qualifier: Diagnosis of  By: Lenna Gilford MD, Scott M    Degenerative joint disease    DEGENERATIVE JOINT DISEASE 11/16/2007   Qualifier: Diagnosis of  By: Julien Girt CMA, Leigh     Diverticulosis of colon    DIVERTICULOSIS OF COLON 07/01/2008   Qualifier: Diagnosis of  By: Lenna Gilford MD, Deborra Medina    Double vision 03/07/2017   Essential hypertension  11/16/2007   Qualifier: Diagnosis of  By: Julien Girt CMA, Leigh     Fingernail abnormalities 03/07/2017   FLANK PAIN, RIGHT 02/03/2010   Qualifier: History of  By: Lenna Gilford MD, Deborra Medina    GERD (gastroesophageal reflux disease)    GOUT 11/16/2007   Qualifier: Diagnosis of  By: Julien Girt CMA, Leigh     Hearing loss 11/24/2014   Heart murmur    History of shingles 10/27/2017   Hypercholesterolemia    HYPERCHOLESTEROLEMIA 11/16/2007   Qualifier: Diagnosis of  By: Julien Girt CMA, Leigh     Ischemic cardiomyopathy    a. echo (09/05/13): EF 35%, diffuse HK worsened distal septal, mid/distal inferior and apical region, grade 1 diastolic dysfunction, mild LAE.     Kidney stone 08/17/2011   Loss of hearing    Lumbar back pain    Measles as a child   Medicare annual wellness visit, subsequent 11/24/2014   Sees Dr Delman Cheadle for dermatology Sees Dr Roni Bread of Urology Sees Dr Stanford Breed of cardiology Sees Dr Virginia Rochester of Opthamology No further colonoscopies warranted        Mumps as a child   Nephrolithiasis    Pain in joint, lower leg 07/29/2014   PERIPHERAL VASCULAR DISEASE 11/16/2007   Qualifier: Diagnosis of  By: Julien Girt CMA, Marliss Czar  Peripheral vascular disease ([place])    Rectal bleeding 03/07/2017   Shingles 07/29/2014   Sun-damaged skin 05/31/2014    Past Surgical History:  Procedure Laterality Date   CORONARY ANGIOPLASTY WITH STENT PLACEMENT  04/18/2013   RCA        CORONARY ARTERY BYPASS GRAFT  1979   x4 SVG-DIAG-LAD, SVG-OM-PDA   CORONARY ARTERY BYPASS GRAFT  1993   Redo x5 by Dr Harlow Asa; Durward Fortes, SVG-OM, SVG-AM-PL   Decompressive laminectomy  01/2006   L2 - scarum by Dr. Shellia Carwin   HEMORRHOID SURGERY     fissure with hemorrhoid corrected at age 76   [place]   Right by Dr Lonzo Cloud HERNIA REPAIR  1996   Left by Dr. Harlow Asa   LEFT HEART CATHETERIZATION WITH CORONARY ANGIOGRAM N/A 09/23/2013   Procedure: LEFT HEART CATHETERIZATION WITH CORONARY ANGIOGRAM;  Surgeon: Blane Ohara, MD;  Location: [facility] C[facility] LAB;  Service: Cardiovascular;  Laterality: N/A;   lens implants     for vision correction   PERCUTANEOUS CORONARY STENT INTERVENTION (PCI-S) N/A 04/18/2013   Procedure: PERCUTANEOUS CORONARY STENT INTERVENTION (PCI-S);  Surgeon: Sherren Mocha, MD;  Location: [facility] C[facility] LAB;  Service: Cardiovascular;  Laterality: N/A;   TONSILLECTOMY      Family History  Problem Relation Age of Onset   Parkinsonism Brother    Diabetes Maternal Grandmother    Depression Daughter    Other Son        4 stents   Heart disease Son    Diabetes Son        type 2   Colon cancer Neg Hx    Esophageal cancer Neg Hx    Rectal cancer Neg Hx    Stomach cancer Neg Hx     Social History   Socioeconomic History   Marital status: Widowed    Spouse name: [name] x 72 years   Number of children: 7   Years of education: Not on file   Highest education level: Not on file  Occupational History   Occupation: Retired - Former Editor, 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   Other Topics Concern   Not on file  Social History Narrative   Married   7 children   Social Determinants of Health   Financial Resource Strain: Low Risk    Difficulty of Paying Living Expenses: Not hard at all  Food Insecurity: No Food Insecurity   Worried About Charity fundraiser in the Last Year: Never true   Arboriculturist in the Last Year: Never true  Transportation Needs: No Transportation Needs   Lack of Transportation (Medical): No   Lack of Transportation  (Non-Medical): No  Physical Activity: Inactive   Days of Exercise per Week: 0 days   Minutes of Exercise per Session: 0 min  Stress: No Stress Concern Present   Feeling of Stress : Not at all  Social Connections: Moderately  Isolated   Frequency of Communication with Friends and Family: More than three times a week   Frequency of Social Gatherings with Friends and Family: More than three times a week   Attends Religious Services: 1 to 4 times per year   Active Member of [employer] or Organizations: No   Attends Archivist Meetings: Never   Marital Status: Widowed  Human resources officer Violence: Not At Risk   Fear of Current or Ex-Partner: No   Emotionally Abused: No   Physically Abused: No   Sexually Abused: No    Outpatient Medications Prior to Visit  Medication Sig Dispense Refill   acetaminophen (TYLENOL) 500 MG tablet Take 500 mg by mouth.     aspirin EC 81 MG tablet Take 81 mg by mouth every morning.      b complex vitamins capsule Take 1 capsule by mouth daily.     busPIRone (BUSPAR) 5 MG tablet Take 1 tablet (5 mg total) by mouth daily. 30 tablet 1   Cholecalciferol (VITAMIN D-3 PO) Take 5,000 Units by mouth daily with breakfast.     cyanocobalamin (,VITAMIN B-12,) 1000 MCG/ML injection Inject 1,000 mcg into the muscle every 30 (thirty) days.     famotidine (PEPCID) 20 MG tablet Take 1 tablet by mouth twice daily 180 tablet 0   finasteride (PROSCAR) 5 MG tablet Takes every third day     folic acid (FOLVITE) 638 MCG tablet Take 400 mcg by mouth 2 (two) times daily.     ibuprofen (ADVIL) 200 MG tablet Take 200 mg by mouth every 6 (six) hours as needed.     isosorbide mononitrate (IMDUR) 30 MG 24 hr tablet Take 3 tablets by mouth once daily 270 tablet 0   losartan (COZAAR) 50 MG tablet Take 1 tablet by mouth once daily 90 tablet 0   metoprolol succinate (TOPROL-XL) 25 MG 24 hr tablet Take 1/2 (one-half) tablet by mouth once daily 45 tablet 3   Misc Natural Products (OSTEO  BI-FLEX ADV JOINT SHIELD) TABS Take 1 tablet by mouth 2 (two) times daily.     Multiple Vitamin (MULTIVITAMIN) tablet Take 1 tablet by mouth daily.     nitroGLYCERIN (NITROSTAT) 0.4 MG SL tablet DISSOLVE ONE TABLET UNDER THE TONGUE EVERY 5 MINUTES AS NEEDED FOR CHEST PAIN.  DO NOT EXCEED A TOTAL OF 3 DOSES IN 15 MINUTES 25 tablet 2   simvastatin (ZOCOR) 40 MG tablet TAKE 1 TABLET BY MOUTH ONCE DAILY IN THE EVENING 90 tablet 1   influenza vaccine adjuvanted (FLUAD) 0.5 ML injection Inject into the muscle. 0.5 mL 0   No facility-administered medications prior to visit.    Allergies  Allergen Reactions   Lisinopril     REACTION: dizziness   Methocarbamol     REACTION: pt states "dizzy"   Other    Pregabalin     REACTION: pt states "dizzy"   Ramipril     REACTION: hives and dizziness    Review of Systems  Constitutional:  Positive for malaise/fatigue. Negative for fever.  HENT:  Negative for congestion.   Eyes:  Negative for blurred vision.  Respiratory:  Negative for shortness of breath.   Cardiovascular:  Negative for chest pain, palpitations and leg swelling.  Gastrointestinal:  Negative for abdominal pain, blood in stool and nausea.  Genitourinary:  Negative for dysuria and frequency.  Musculoskeletal:  Negative for falls.  Skin:  Negative for rash.  Neurological:  Negative for dizziness, loss of consciousness and headaches.  Endo/Heme/Allergies:  Negative for environmental allergies.  Psychiatric/Behavioral:  Negative for depression. The patient is not nervous/anxious.       Objective:    Physical Exam Constitutional:      General: He is not in acute distress.    Appearance: Normal appearance. He is not ill-appearing or toxic-appearing.  HENT:     Head: Normocephalic and atraumatic.     Right Ear: External ear normal.     Left Ear: External ear normal.     Nose: Nose normal.     Mouth/Throat:     Mouth: Mucous membranes are moist.  Eyes:     General:        Right  eye: No discharge.        Left eye: No discharge.     Conjunctiva/sclera: Conjunctivae normal.  Cardiovascular:     Rate and Rhythm: Normal rate and regular rhythm.  Pulmonary:     Effort: Pulmonary effort is normal.     Breath sounds: No wheezing.  Abdominal:     General: Bowel sounds are normal.     Palpations: Abdomen is soft.  Skin:    Findings: No rash.  Neurological:     Mental Status: He is alert and oriented to person, place, and time.  Psychiatric:        Behavior: Behavior normal.    BP (!) 106/58    Pulse 63    Temp 97.7 F (36.5 C)    Resp 16    Wt 155 lb 9.6 oz (70.6 kg)    SpO2 97%    BMI 21.10 kg/m  Wt Readings from Last 3 Encounters:  01/04/22 155 lb 9.6 oz (70.6 kg)  12/03/21 144 lb (65.3 kg)  09/07/21 150 lb 12.8 oz (68.4 kg)    Diabetic Foot Exam - Simple   No data filed    Lab Results  Component Value Date   WBC 8.3 01/04/2022   HGB 12.3 (L) 01/04/2022   HCT 37.2 (L) 01/04/2022   PLT 188.0 01/04/2022   GLUCOSE 89 01/04/2022   CHOL 99 01/04/2022   TRIG 97.0 01/04/2022   HDL 41.50 01/04/2022   LDLCALC 38 01/04/2022   ALT 17 01/04/2022   AST 27 01/04/2022   NA 137 01/04/2022   K 4.8 01/04/2022   CL 104 01/04/2022   CREATININE 1.38 01/04/2022   BUN 28 (H) 01/04/2022   CO2 27 01/04/2022   TSH 1.67 01/04/2022   PSA 25.40 12/08/2021   INR 1.09 02/04/2018   HGBA1C 5.8 01/04/2022    Lab Results  Component Value Date   TSH 1.67 01/04/2022   Lab Results  Component Value Date   WBC 8.3 01/04/2022   HGB 12.3 (L) 01/04/2022   HCT 37.2 (L) 01/04/2022   MCV 100.1 (H) 01/04/2022   PLT 188.0 01/04/2022   Lab Results  Component Value Date   NA 137 01/04/2022   K 4.8 01/04/2022   CO2 27 01/04/2022   GLUCOSE 89 01/04/2022   BUN 28 (H) 01/04/2022   CREATININE 1.38 01/04/2022   BILITOT 1.3 (H) 01/04/2022   ALKPHOS 58 01/04/2022   AST 27 01/04/2022   ALT 17 01/04/2022   PROT 6.3 01/04/2022   ALBUMIN 4.1 01/04/2022   CALCIUM 9.7 01/04/2022    ANIONGAP 7 11/30/2019   GFR 43.40 (L) 01/04/2022   Lab Results  Component Value Date   CHOL 99 01/04/2022   Lab Results  Component Value Date   HDL 41.50 01/04/2022   Lab Results  Component Value Date   LDLCALC 38 01/04/2022   Lab Results  Component Value Date   TRIG 97.0 01/04/2022   Lab Results  Component Value Date   CHOLHDL 2 01/04/2022   Lab Results  Component Value Date   HGBA1C 5.8 01/04/2022       Assessment & Plan:   Problem List Items Addressed This Visit     HYPERCHOLESTEROLEMIA - Primary    Encourage heart healthy diet such as MIND or DASH diet, increase exercise, avoid trans fats, simple carbohydrates and processed foods, consider a krill or fish or flaxseed oil cap daily.       Relevant Orders   Lipid panel (Completed)   Essential hypertension    Well controlled, no changes to meds. Encouraged heart healthy diet such as the DASH diet and exercise as tolerated.       Relevant Orders   CBC (Completed)   Comprehensive metabolic panel (Completed)   TSH (Completed)   Anemia   Relevant Orders   CBC (Completed)   Iron, TIBC and Ferritin Panel (Completed)   Hyperglycemia    hgba1c acceptable, minimize simple carbs. Increase exercise as tolerated.       Relevant Orders   Hemoglobin A1c (Completed)   Chronic renal disease    Hydrate and monitor      Skin cancer    On his nose, is following with [facility]       I have discontinued Maudie Flakes. Alfrey "Dick"'s influenza vaccine adjuvanted. I am also having him maintain his folic acid, Osteo Bi-Flex Adv Joint Shield, Cholecalciferol (VITAMIN D-3 PO), aspirin EC, finasteride, nitroGLYCERIN, multivitamin, cyanocobalamin, simvastatin, busPIRone, metoprolol succinate, isosorbide mononitrate, famotidine, losartan, b complex vitamins, ibuprofen, and acetaminophen.  No orders of the defined types were placed in this encounter.    Penni Homans, MD

## 2022-01-10 NOTE — Assessment & Plan Note (Signed)
On his nose, is following with [facility]

## 2022-01-10 NOTE — Assessment & Plan Note (Signed)
hgba1c acceptable, minimize simple carbs. Increase exercise as tolerated.  

## 2022-01-10 NOTE — Telephone Encounter (Signed)
Pt aware.

## 2022-01-12 ENCOUNTER — Ambulatory Visit (INDEPENDENT_AMBULATORY_CARE_PROVIDER_SITE_OTHER): Payer: Medicare Other

## 2022-01-12 DIAGNOSIS — E538 Deficiency of other specified B group vitamins: Secondary | ICD-10-CM

## 2022-01-12 MED ORDER — CYANOCOBALAMIN 1000 MCG/ML IJ SOLN
1000.0000 ug | INTRAMUSCULAR | Status: DC
  Administered 2022-01-12: 1000 ug via INTRAMUSCULAR

## 2022-01-12 NOTE — Progress Notes (Signed)
Pt here for monthly B12 injection per PCP order.    B12 1013mcg given IM in left deltoid, and pt tolerated injection well.   Next B12 injection scheduled for one month.

## 2022-01-19 DIAGNOSIS — Z20822 Contact with and (suspected) exposure to covid-19: Secondary | ICD-10-CM | POA: Diagnosis not present

## 2022-01-20 DIAGNOSIS — Z20822 Contact with and (suspected) exposure to covid-19: Secondary | ICD-10-CM | POA: Diagnosis not present

## 2022-01-24 DIAGNOSIS — Z20822 Contact with and (suspected) exposure to covid-19: Secondary | ICD-10-CM | POA: Diagnosis not present

## 2022-02-04 ENCOUNTER — Other Ambulatory Visit: Payer: Self-pay | Admitting: Family Medicine

## 2022-02-04 ENCOUNTER — Other Ambulatory Visit: Payer: Self-pay | Admitting: Cardiology

## 2022-02-04 DIAGNOSIS — I25700 Atherosclerosis of coronary artery bypass graft(s), unspecified, with unstable angina pectoris: Secondary | ICD-10-CM

## 2022-02-04 DIAGNOSIS — I10 Essential (primary) hypertension: Secondary | ICD-10-CM

## 2022-02-04 DIAGNOSIS — E7800 Pure hypercholesterolemia, unspecified: Secondary | ICD-10-CM

## 2022-02-06 DIAGNOSIS — Z20828 Contact with and (suspected) exposure to other viral communicable diseases: Secondary | ICD-10-CM | POA: Diagnosis not present

## 2022-02-07 DIAGNOSIS — Z20822 Contact with and (suspected) exposure to covid-19: Secondary | ICD-10-CM | POA: Diagnosis not present

## 2022-02-08 ENCOUNTER — Other Ambulatory Visit: Payer: Self-pay | Admitting: Family Medicine

## 2022-02-09 ENCOUNTER — Ambulatory Visit (INDEPENDENT_AMBULATORY_CARE_PROVIDER_SITE_OTHER): Payer: Medicare Other

## 2022-02-09 DIAGNOSIS — E538 Deficiency of other specified B group vitamins: Secondary | ICD-10-CM

## 2022-02-09 MED ORDER — CYANOCOBALAMIN 1000 MCG/ML IJ SOLN
1000.0000 ug | INTRAMUSCULAR | Status: DC
  Administered 2022-02-09 – 2022-06-10 (×2): 1000 ug via INTRAMUSCULAR

## 2022-02-09 NOTE — Progress Notes (Signed)
? ? ? ? ?HPI: FU CAD; s/p CABG in 1979 and 1993, ischemic CM, systolic CHF, AAA, HTN, HL. Patient underwent cardiac catheterization in May of 2014. The left main, LAD, circumflex and RCA were occluded. The LIMA to the LAD was patent and the distal LAD was occluded after the insertion. Saphenous vein graft to the obtuse marginal was occluded. Saphenous vein graft to the acute marginal and PDA had a high-grade lesion prior to insertion into the PDA of 80-90%. Ejection fraction was 25%. PCI: Promus Premier (3.5x12 mm) DES to the [facility]. Repeat catheterization in October 2014 because of recurrent chest pain. The stent placed in the saphenous vein graft to the PDA had mild in-stent restenosis. Medical therapy recommended. Nuclear study 2/17 showed EF 35, inferolateral scar, no ischemia. Carotid Dopplers February 2018 showed less than 50% bilateral stenosis. Most recent echocardiogram April 2022 showed ejection fraction 25 to 30%, trace aortic insufficiency.  Abdominal ultrasound September 2022 showed 4 cm abdominal aortic aneurysm.  Since he was last seen, patient denies dyspnea, chest pain, palpitations or syncope. ? ?Current Outpatient Medications  ?Medication Sig Dispense Refill  ? acetaminophen (TYLENOL) 500 MG tablet Take 500 mg by mouth.    ? aspirin EC 81 MG tablet Take 81 mg by mouth every morning.     ? b complex vitamins capsule Take 1 capsule by mouth daily.    ? busPIRone (BUSPAR) 5 MG tablet Take 1 tablet (5 mg total) by mouth daily. 30 tablet 1  ? Cholecalciferol (VITAMIN D-3 PO) Take 5,000 Units by mouth daily with breakfast.    ? cyanocobalamin (,VITAMIN B-12,) 1000 MCG/ML injection Inject 1,000 mcg into the muscle every 30 (thirty) days.    ? famotidine (PEPCID) 20 MG tablet Take 1 tablet by mouth twice daily 180 tablet 0  ? finasteride (PROSCAR) 5 MG tablet Takes every third day    ? folic acid (FOLVITE) 009 MCG tablet Take 400 mcg by mouth 2 (two) times daily.    ? ibuprofen (ADVIL) 200 MG tablet Take  200 mg by mouth every 6 (six) hours as needed.    ? isosorbide mononitrate (IMDUR) 30 MG 24 hr tablet Take 3 tablets by mouth once daily 270 tablet 0  ? losartan (COZAAR) 50 MG tablet Take 1 tablet by mouth once daily 90 tablet 3  ? metoprolol succinate (TOPROL-XL) 25 MG 24 hr tablet Take 1/2 (one-half) tablet by mouth once daily 45 tablet 3  ? Misc Natural Products (OSTEO BI-FLEX ADV JOINT SHIELD) TABS Take 1 tablet by mouth 2 (two) times daily.    ? Multiple Vitamin (MULTIVITAMIN) tablet Take 1 tablet by mouth daily.    ? nitroGLYCERIN (NITROSTAT) 0.4 MG SL tablet DISSOLVE ONE TABLET UNDER THE TONGUE EVERY 5 MINUTES AS NEEDED FOR CHEST PAIN.  DO NOT EXCEED A TOTAL OF 3 DOSES IN 15 MINUTES 25 tablet 2  ? simvastatin (ZOCOR) 40 MG tablet TAKE 1 TABLET BY MOUTH ONCE DAILY IN THE EVENING 90 tablet 1  ? ?Current Facility-Administered Medications  ?Medication Dose Route Frequency Provider Last Rate Last Admin  ? cyanocobalamin ((VITAMIN B-12)) injection 1,000 mcg  1,000 mcg Intramuscular Q30 days Penni Homans A, MD   1,000 mcg at 01/12/22 1107  ? cyanocobalamin ((VITAMIN B-12)) injection 1,000 mcg  1,000 mcg Intramuscular Q30 days [name], MD   1,000 mcg at 02/09/22 1124  ? ? ? ?Past Medical History:  ?Diagnosis Date  ? Abdominal aortic aneurysm   ? a. [place] (1/14):  3.3 x 3.4 cm => f/u 11/2013  ? Amebic dysentery   ? AMEBIC DYSENTERY 11/19/2007  ? Qualifier: History of  By: Lenna Gilford MD, Deborra Medina   ? Anemia 03/07/2017  ? ANXIETY 11/19/2007  ? Qualifier: Diagnosis of  By: Lenna Gilford MD, Deborra Medina   ? Arthritis 03/07/2017  ? Atherosclerosis of coronary artery bypass graft with unstable angina pectoris ([place]) 04/19/2013  ? BACK PAIN, LUMBAR 11/16/2007  ? Qualifier: Diagnosis of  By: Julien Girt CMA, Leigh    ? Benign prostatic hypertrophy   ? BENIGN PROSTATIC HYPERTROPHY, HX OF 11/16/2007  ? Qualifier: Diagnosis of  By: Julien Girt CMA, Leigh    ? BRBPR (bright red blood per rectum) 11/01/2016  ? CAD (coronary artery disease)   ? a. s/p CABG  in 1979 and 1993;  b. LHC (5/14):  LM, LAD, CFX and RCA occluded; L-LAD ok, dLAD occluded after insertion of LIMA, S-OM occluded, S-PDA/AM 80-90 => PCI with Promus DES; EF 25%  ? Cardiomyopathy, ischemic 06/05/2013  ? Cerumen impaction   ? Bilateral  ? Chicken pox as a child  ? Chronic systolic CHF (congestive heart failure) ([place])   ? COLONIC POLYPS 07/01/2008  ? Qualifier: Diagnosis of  By: Lenna Gilford MD, Deborra Medina   ? Degenerative joint disease   ? DEGENERATIVE JOINT DISEASE 11/16/2007  ? Qualifier: Diagnosis of  By: Julien Girt CMA, Leigh    ? Diverticulosis of colon   ? DIVERTICULOSIS OF COLON 07/01/2008  ? Qualifier: Diagnosis of  By: Lenna Gilford MD, Deborra Medina   ? Double vision 03/07/2017  ? Essential hypertension 11/16/2007  ? Qualifier: Diagnosis of  By: Julien Girt CMA, Leigh    ? Fingernail abnormalities 03/07/2017  ? FLANK PAIN, RIGHT 02/03/2010  ? Qualifier: History of  By: Lenna Gilford MD, Deborra Medina   ? GERD (gastroesophageal reflux disease)   ? GOUT 11/16/2007  ? Qualifier: Diagnosis of  By: Julien Girt CMA, Leigh    ? Hearing loss 11/24/2014  ? Heart murmur   ? History of shingles 10/27/2017  ? Hypercholesterolemia   ? HYPERCHOLESTEROLEMIA 11/16/2007  ? Qualifier: Diagnosis of  By: Julien Girt CMA, Leigh    ? Ischemic cardiomyopathy   ? a. echo (09/05/13): EF 35%, diffuse HK worsened distal septal, mid/distal inferior and apical region, grade 1 diastolic dysfunction, mild LAE.    ? Kidney stone 08/17/2011  ? Loss of hearing   ? Lumbar back pain   ? Measles as a child  ? Medicare annual wellness visit, subsequent 11/24/2014  ? Sees Dr Delman Cheadle for dermatology Sees Dr Roni Bread of Urology Sees Dr Stanford Breed of cardiology Sees Dr Virginia Rochester of Opthamology No further colonoscopies warranted       ? Mumps as a child  ? Nephrolithiasis   ? Pain in joint, lower leg 07/29/2014  ? PERIPHERAL VASCULAR DISEASE 11/16/2007  ? Qualifier: Diagnosis of  By: Julien Girt CMA, Leigh    ? Peripheral vascular disease ([place])   ? Rectal bleeding 03/07/2017  ? Shingles 07/29/2014  ? Sun-damaged skin  05/31/2014  ? ? ?Past Surgical History:  ?Procedure Laterality Date  ? CORONARY ANGIOPLASTY WITH STENT PLACEMENT  04/18/2013  ? RCA       ? CORONARY ARTERY BYPASS GRAFT  1979  ? x4 SVG-DIAG-LAD, SVG-OM-PDA  ? CORONARY ARTERY BYPASS GRAFT  1993  ? Redo x5 by Dr Harlow Asa; Durward Fortes, SVG-OM, SVG-AM-PL  ? Decompressive laminectomy  01/2006  ? L2 - scarum by Dr. Shellia Carwin  ? HEMORRHOID SURGERY    ? fissure with hemorrhoid corrected  at age 38  ? [place]  ? Right by Dr Harlow Asa  ? [place]  ? Left by Dr. Harlow Asa  ? LEFT HEART CATHETERIZATION WITH CORONARY ANGIOGRAM N/A 09/23/2013  ? Procedure: LEFT HEART CATHETERIZATION WITH CORONARY ANGIOGRAM;  Surgeon: Blane Ohara, MD;  Location: [facility] CATH LAB;  Service: Cardiovascular;  Laterality: N/A;  ? lens implants    ? for vision correction  ? PERCUTANEOUS CORONARY STENT INTERVENTION (PCI-S) N/A 04/18/2013  ? Procedure: PERCUTANEOUS CORONARY STENT INTERVENTION (PCI-S);  Surgeon: Sherren Mocha, MD;  Location: [facility] CATH LAB;  Service: Cardiovascular;  Laterality: N/A;  ? TONSILLECTOMY    ? ? ?Social History  ? ?Socioeconomic History  ? Marital status: Widowed  ?  Spouse name: [name] x 72 years  ? Number of children: 7  ? Years of education: Not on file  ? Highest education level: Not on file  ?Occupational History  ? 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 Smoking status: Former  ?  Types: Cigarettes  ?  Quit date: 11/28/1944  ?  Years since quitting: 77.2  ? Smokeless tobacco: Never  ?Vaping Use  ? Vaping Use: Never used  ?Substance and Sexual Activity  ? Alcohol use: Yes  ?  Alcohol/week: 2.0 standard drinks  ?  Types: 2 Standard drinks or equivalent per week  ?  Comment: occasional alcohol use  ? Drug use: No  ? Sexual activity: Not Currently  ?  Comment: lives with wife, no dietary restrictions.   ?Other Topics Concern  ? Not on file  ?Social History Narrative  ? Married  ? 7 children  ? ?Social Determinants of  Health  ? ?Financial Resource Strain: Low Risk   ? Difficulty of Paying Living Expenses: Not hard at all  ?Food Insecurity: No Food Insecurity  ? Worried About Charity fundraiser in the Last Year: Never tr

## 2022-02-09 NOTE — Progress Notes (Signed)
Pt here for monthly B12 injection per Dr. Charlett Blake ? ?B12 1018mg given left IM, and pt tolerated injection well. ? ?Next B12 injection scheduled for 03/16/2022 ?

## 2022-02-16 ENCOUNTER — Other Ambulatory Visit: Payer: Self-pay

## 2022-02-16 ENCOUNTER — Encounter: Payer: Self-pay | Admitting: Cardiology

## 2022-02-16 ENCOUNTER — Ambulatory Visit (INDEPENDENT_AMBULATORY_CARE_PROVIDER_SITE_OTHER): Payer: Medicare Other | Admitting: Cardiology

## 2022-02-16 VITALS — BP 114/58 | HR 49 | Ht 72.0 in | Wt 152.1 lb

## 2022-02-16 DIAGNOSIS — I255 Ischemic cardiomyopathy: Secondary | ICD-10-CM | POA: Diagnosis not present

## 2022-02-16 DIAGNOSIS — I10 Essential (primary) hypertension: Secondary | ICD-10-CM | POA: Diagnosis not present

## 2022-02-16 DIAGNOSIS — I714 Abdominal aortic aneurysm, without rupture, unspecified: Secondary | ICD-10-CM | POA: Diagnosis not present

## 2022-02-16 DIAGNOSIS — I2510 Atherosclerotic heart disease of native coronary artery without angina pectoris: Secondary | ICD-10-CM | POA: Diagnosis not present

## 2022-02-16 DIAGNOSIS — E7800 Pure hypercholesterolemia, unspecified: Secondary | ICD-10-CM

## 2022-02-16 NOTE — Patient Instructions (Signed)
?  Follow-Up: ?At [facility], you and your health needs are our priority.  As part of our continuing mission to provide you with exceptional heart care, we have created designated Provider Care Teams.  These Care Teams include your primary Cardiologist (physician) and Advanced Practice Providers (APPs -  Physician Assistants and Nurse Practitioners) who all work together to provide you with the care you need, when you need it. ? ?We recommend signing up for the patient portal called "MyChart".  Sign up information is provided on this After Visit Summary.  MyChart is used to connect with patients for Virtual Visits (Telemedicine).  Patients are able to view lab/test results, encounter notes, upcoming appointments, etc.  Non-urgent messages can be sent to your provider as well.   ?To learn more about what you can do with MyChart, go to [email or web address].   ? ?Your next appointment:   ?6 month(s) ? ?The format for your next appointment:   ?In Person ? ? ?

## 2022-02-20 DIAGNOSIS — Z20822 Contact with and (suspected) exposure to covid-19: Secondary | ICD-10-CM | POA: Diagnosis not present

## 2022-02-25 DIAGNOSIS — Z20822 Contact with and (suspected) exposure to covid-19: Secondary | ICD-10-CM | POA: Diagnosis not present

## 2022-02-28 DIAGNOSIS — Z20822 Contact with and (suspected) exposure to covid-19: Secondary | ICD-10-CM | POA: Diagnosis not present

## 2022-02-28 DIAGNOSIS — R059 Cough, unspecified: Secondary | ICD-10-CM | POA: Diagnosis not present

## 2022-02-28 DIAGNOSIS — R051 Acute cough: Secondary | ICD-10-CM | POA: Diagnosis not present

## 2022-03-01 ENCOUNTER — Telehealth: Payer: Self-pay | Admitting: Family Medicine

## 2022-03-01 NOTE — Telephone Encounter (Signed)
Pt states he was outside picking up some twigs outside and took a nap. When he woke up his foot was slightly swollen and the last 3 toes were stiff and hard to bend. He would like to know if this is something to be concerned about and would like a call as soon as possible. Advised pt nurse was informed and recommended to elevate and ice and she will give him a call to go over his symptoms as soon as she can. Please advise.  ?

## 2022-03-01 NOTE — Telephone Encounter (Signed)
Pt advised to elevate and ice. Advised pt to let us know if it does not improve so we can get him in to see someone. Pt verbalized understanding.  ?

## 2022-03-02 DIAGNOSIS — R972 Elevated prostate specific antigen [PSA]: Secondary | ICD-10-CM | POA: Diagnosis not present

## 2022-03-02 DIAGNOSIS — Z20822 Contact with and (suspected) exposure to covid-19: Secondary | ICD-10-CM | POA: Diagnosis not present

## 2022-03-03 DIAGNOSIS — Z20822 Contact with and (suspected) exposure to covid-19: Secondary | ICD-10-CM | POA: Diagnosis not present

## 2022-03-04 ENCOUNTER — Emergency Department (INDEPENDENT_AMBULATORY_CARE_PROVIDER_SITE_OTHER)
Admission: RE | Admit: 2022-03-04 | Discharge: 2022-03-04 | Disposition: A | Discharge status: Home / Self Care | Payer: Medicare Other | Source: Ambulatory Visit

## 2022-03-04 VITALS — BP 153/72 | HR 56 | Temp 98.2°F | Resp 18 | Ht 72.0 in | Wt 146.0 lb

## 2022-03-04 DIAGNOSIS — R609 Edema, unspecified: Secondary | ICD-10-CM | POA: Diagnosis not present

## 2022-03-04 DIAGNOSIS — R590 Localized enlarged lymph nodes: Secondary | ICD-10-CM | POA: Diagnosis not present

## 2022-03-04 DIAGNOSIS — Z20822 Contact with and (suspected) exposure to covid-19: Secondary | ICD-10-CM | POA: Diagnosis not present

## 2022-03-04 NOTE — ED Triage Notes (Signed)
Lump noted to R groin per pt - came up this morning - no pain per pt  ?Swelling to right foot noted  ?Pt here with his daughter - Izora Gala ?

## 2022-03-04 NOTE — Discharge Instructions (Addendum)
Advised patient and daughter that small bump at right inguinal crease is most likely engorged lymph node should resolve on its own over the next 2 to 3 weeks.  Advised bilateral lower extremity pedal edema is dependent edema and have encouraged patient to wear properly fitted compression stockings/TED hose which [facility] can order for him and to keep feet up as much as possible when at home to improve venous return. Advised patient if symptoms worsen and/or unresolved please follow-up with PCP or here for further evaluation. ?

## 2022-03-04 NOTE — ED Provider Notes (Signed)
?[place] ? ? ? ?CSN: [id number] ?Arrival date & time: 03/04/22  1645 ? ? ?  ? ?History   ?Chief Complaint ?Chief Complaint  ?Patient presents with  ? Foot Swelling  ?  right  ? ? ?HPI ?Daniel Reeves is a 88 y.o. male.  ? ?HPI Very pleasant 64--year-old male presents with lump of right groin that came up this morning and right foot swelling.  Patient is accompanied by his daughter this morning.  PMH significant for abdominal aortic aneurysm, chronic systolic heart failure, ischemic cardiomyopathy, and diverticulosis. ? ?Past Medical History:  ?Diagnosis Date  ? Abdominal aortic aneurysm ([place])   ? a. [place] (1/14):  3.3 x 3.4 cm => f/u 11/2013  ? Amebic dysentery   ? AMEBIC DYSENTERY 11/19/2007  ? Qualifier: History of  By: Lenna Gilford MD, Deborra Medina   ? Anemia 03/07/2017  ? ANXIETY 11/19/2007  ? Qualifier: Diagnosis of  By: Lenna Gilford MD, Deborra Medina   ? Arthritis 03/07/2017  ? Atherosclerosis of coronary artery bypass graft with unstable angina pectoris ([place]) 04/19/2013  ? BACK PAIN, LUMBAR 11/16/2007  ? Qualifier: Diagnosis of  By: Julien Girt CMA, Leigh    ? Benign prostatic hypertrophy   ? BENIGN PROSTATIC HYPERTROPHY, HX OF 11/16/2007  ? Qualifier: Diagnosis of  By: Julien Girt CMA, Leigh    ? BRBPR (bright red blood per rectum) 11/01/2016  ? CAD (coronary artery disease)   ? a. s/p CABG in 1979 and 1993;  b. LHC (5/14):  LM, LAD, CFX and RCA occluded; L-LAD ok, dLAD occluded after insertion of LIMA, S-OM occluded, S-PDA/AM 80-90 => PCI with Promus DES; EF 25%  ? Cardiomyopathy, ischemic 06/05/2013  ? Cerumen impaction   ? Bilateral  ? Chicken pox as a child  ? Chronic systolic CHF (congestive heart failure) ([place])   ? COLONIC POLYPS 07/01/2008  ? Qualifier: Diagnosis of  By: Lenna Gilford MD, Deborra Medina   ? Degenerative joint disease   ? DEGENERATIVE JOINT DISEASE 11/16/2007  ? Qualifier: Diagnosis of  By: Julien Girt CMA, Leigh    ? Diverticulosis of colon   ? DIVERTICULOSIS OF COLON 07/01/2008  ? Qualifier: Diagnosis of  By: Lenna Gilford MD, Deborra Medina   ?  Double vision 03/07/2017  ? Essential hypertension 11/16/2007  ? Qualifier: Diagnosis of  By: Julien Girt CMA, Leigh    ? Fingernail abnormalities 03/07/2017  ? FLANK PAIN, RIGHT 02/03/2010  ? Qualifier: History of  By: Lenna Gilford MD, Deborra Medina   ? GERD (gastroesophageal reflux disease)   ? GOUT 11/16/2007  ? Qualifier: Diagnosis of  By: Julien Girt CMA, Leigh    ? Hearing loss 11/24/2014  ? Heart murmur   ? History of shingles 10/27/2017  ? Hypercholesterolemia   ? HYPERCHOLESTEROLEMIA 11/16/2007  ? Qualifier: Diagnosis of  By: Julien Girt CMA, Leigh    ? Ischemic cardiomyopathy   ? a. echo (09/05/13): EF 35%, diffuse HK worsened distal septal, mid/distal inferior and apical region, grade 1 diastolic dysfunction, mild LAE.    ? Kidney stone 08/17/2011  ? Loss of hearing   ? Lumbar back pain   ? Measles as a child  ? Medicare annual wellness visit, subsequent 11/24/2014  ? Sees Dr Delman Cheadle for dermatology Sees Dr Roni Bread of Urology Sees Dr Stanford Breed of cardiology Sees Dr Virginia Rochester of Opthamology No further colonoscopies warranted       ? Mumps as a child  ? Nephrolithiasis   ? Pain in joint, lower leg 07/29/2014  ? PERIPHERAL VASCULAR DISEASE 11/16/2007  ?  Qualifier: Diagnosis of  By: Julien Girt CMA, Leigh    ? Peripheral vascular disease ([place])   ? Rectal bleeding 03/07/2017  ? Shingles 07/29/2014  ? Sun-damaged skin 05/31/2014  ? ? ?Patient Active Problem List  ? Diagnosis Date Noted  ? Skin cancer 01/10/2022  ? Chronic renal disease 03/14/2020  ? Grief reaction 12/24/2019  ? Hyperglycemia 01/22/2019  ? Edema 12/17/2018  ? Right hip pain 12/17/2018  ? Foot pain, right 03/01/2018  ? Hemorrhoid 10/29/2017  ? History of shingles 10/27/2017  ? Anemia 03/07/2017  ? Medicare annual wellness visit, subsequent 11/24/2014  ? Hearing loss 11/24/2014  ? Pain in joint, lower leg 07/29/2014  ? Sun-damaged skin 05/31/2014  ? Cardiomyopathy, ischemic 06/05/2013  ? Kidney stone 08/17/2011  ? CAD (coronary artery disease)   ? FLANK PAIN, RIGHT 02/03/2010  ? Abdominal aortic  aneurysm ([place]) 11/04/2009  ? COLONIC POLYPS 07/01/2008  ? Anxiety state 11/19/2007  ? HYPERCHOLESTEROLEMIA 11/16/2007  ? Gout 11/16/2007  ? Essential hypertension 11/16/2007  ? PERIPHERAL VASCULAR DISEASE 11/16/2007  ? GERD 11/16/2007  ? Osteoarthritis 11/16/2007  ? BACK PAIN, LUMBAR 11/16/2007  ? BPH (benign prostatic hyperplasia) 11/16/2007  ? ? ?Past Surgical History:  ?Procedure Laterality Date  ? CORONARY ANGIOPLASTY WITH STENT PLACEMENT  04/18/2013  ? RCA       ? CORONARY ARTERY BYPASS GRAFT  1979  ? x4 SVG-DIAG-LAD, SVG-OM-PDA  ? CORONARY ARTERY BYPASS GRAFT  1993  ? Redo x5 by Dr Harlow Asa; Durward Fortes, SVG-OM, SVG-AM-PL  ? Decompressive laminectomy  01/2006  ? L2 - scarum by Dr. Shellia Carwin  ? HEMORRHOID SURGERY    ? fissure with hemorrhoid corrected at age 43  ? [place]  ? Right by Dr Harlow Asa  ? [place]  ? Left by Dr. Harlow Asa  ? LEFT HEART CATHETERIZATION WITH CORONARY ANGIOGRAM N/A 09/23/2013  ? Procedure: LEFT HEART CATHETERIZATION WITH CORONARY ANGIOGRAM;  Surgeon: Blane Ohara, MD;  Location: [facility] CATH LAB;  Service: Cardiovascular;  Laterality: N/A;  ? lens implants    ? for vision correction  ? PERCUTANEOUS CORONARY STENT INTERVENTION (PCI-S) N/A 04/18/2013  ? Procedure: PERCUTANEOUS CORONARY STENT INTERVENTION (PCI-S);  Surgeon: Sherren Mocha, MD;  Location: [facility] CATH LAB;  Service: Cardiovascular;  Laterality: N/A;  ? TONSILLECTOMY    ? ? ? ? ? ?Home Medications   ? ?Prior to Admission medications   ?Medication Sig Start Date End Date Taking? Authorizing Provider  ?acetaminophen (TYLENOL) 500 MG tablet Take 500 mg by mouth.    [provider]  ?aspirin EC 81 MG tablet Take 81 mg by mouth every morning.     [provider]  ?b complex vitamins capsule Take 1 capsule by mouth daily.    [provider]  ?busPIRone (BUSPAR) 5 MG tablet Take 1 tablet (5 mg total) by mouth daily. 09/17/21   [name], MD  ?Cholecalciferol (VITAMIN D-3  PO) Take 5,000 Units by mouth daily with breakfast.    [provider]  ?cyanocobalamin (,VITAMIN B-12,) 1000 MCG/ML injection Inject 1,000 mcg into the muscle every 30 (thirty) days. 05/12/21   [provider]  ?famotidine (PEPCID) 20 MG tablet Take 1 tablet by mouth twice daily 02/08/22   [name], MD  ?finasteride (PROSCAR) 5 MG tablet Takes every third day    [provider]  ?folic acid (FOLVITE) 195 MCG tablet Take 400 mcg by mouth 2 (two) times daily.    [provider]  ?ibuprofen (ADVIL) 200 MG tablet Take 200 mg by mouth every 6 (six) hours as needed.    [provider]  ?isosorbide mononitrate (IMDUR) 30 MG 24 hr tablet Take 3 tablets by mouth once daily 02/08/22   [name], MD  ?losartan (COZAAR) 50 MG tablet Take 1 tablet by mouth once daily 02/04/22   Lelon Perla, MD  ?metoprolol succinate (TOPROL-XL) 25 MG 24 hr tablet Take 1/2 (one-half) tablet by mouth once daily 09/23/21   Lelon Perla, MD  ?Misc Natural Products (OSTEO BI-FLEX ADV JOINT SHIELD) TABS Take 1 tablet by mouth 2 (two) times daily.    [provider]  ?Multiple Vitamin (MULTIVITAMIN) tablet Take 1 tablet by mouth daily.    [provider]  ?nitroGLYCERIN (NITROSTAT) 0.4 MG SL tablet DISSOLVE ONE TABLET UNDER THE TONGUE EVERY 5 MINUTES AS NEEDED FOR CHEST PAIN.  DO NOT EXCEED A TOTAL OF 3 DOSES IN 15 MINUTES 05/22/20   Lelon Perla, MD  ?simvastatin (ZOCOR) 40 MG tablet TAKE 1 TABLET BY MOUTH ONCE DAILY IN THE EVENING 02/04/22   [name], MD  ? ? ?Family History ?Family History  ?Problem Relation Age of Onset  ? Parkinsonism Brother   ? Diabetes Maternal Grandmother   ? Depression Daughter   ? Other Son   ?     4 stents  ? Heart disease Son   ? Diabetes Son   ?     type 2  ? Colon cancer Neg Hx   ? Esophageal cancer Neg Hx   ? Rectal cancer Neg Hx   ? Stomach cancer Neg Hx   ? ? ?Social History ?Social History  ? ?Tobacco Use  ? Smoking status:  Former  ?  Types: Cigarettes  ?  Quit date: 11/28/1944  ?  Years since quitting: 77.3  ? Smokeless tobacco: Never  ?Vaping Use  ? Vaping Use: Never used  ?Substance Use Topics  ? Alcohol use: Yes  ?  Alcohol/w

## 2022-03-07 ENCOUNTER — Other Ambulatory Visit: Payer: Self-pay | Admitting: Family Medicine

## 2022-03-07 ENCOUNTER — Telehealth: Payer: Self-pay

## 2022-03-07 NOTE — Telephone Encounter (Signed)
Pt went to [facility].

## 2022-03-07 NOTE — Telephone Encounter (Signed)
Pt was seen at [facility] on 03/06/22. ? ?Patient Name: Daniel Reeves ?Gender: Male ?DOB: Sep 22, 1924 ?[employer] [facility] - Client ?Client Site [facility] - Night ?Provider Penni Homans - MD ?Contact Type Call ?Who Is Calling Patient / Member / Family / Caregiver ?Call Type Triage / Clinical ?Relationship To Patient Self ?Return Phone Number [phone] (Primary) ?Chief Complaint Skin Lesion - Moles/ Lumps/ Growths ?Reason for Call Symptomatic / Request for Health Information ?Initial Comment The caller states that he has lump on his right ?side (where his appendix should be). The caller ?reports that the lump is not painful but he is still ?concerned. ?Translation No ?Nurse Assessment ?Nurse: Nicki Reaper, RN, Malachy Mood Date/Time (Eastern Time): 03/04/2022 3:39:19 PM ?Confirm and document reason for call. If ?symptomatic, describe symptoms. ?---Caller states he has a lump on his RLQ that started ?2 hrs ago after his nap, he circled the area a pen, lump ?is the size of the end of his finger, if he lays his arm ?between his joint he has mild pain, denies pain and ?other symptoms, ? ?

## 2022-03-09 ENCOUNTER — Other Ambulatory Visit: Payer: Self-pay | Admitting: Family Medicine

## 2022-03-10 DIAGNOSIS — Z20822 Contact with and (suspected) exposure to covid-19: Secondary | ICD-10-CM | POA: Diagnosis not present

## 2022-03-14 DIAGNOSIS — N401 Enlarged prostate with lower urinary tract symptoms: Secondary | ICD-10-CM | POA: Diagnosis not present

## 2022-03-14 DIAGNOSIS — R351 Nocturia: Secondary | ICD-10-CM | POA: Diagnosis not present

## 2022-03-14 DIAGNOSIS — R972 Elevated prostate specific antigen [PSA]: Secondary | ICD-10-CM | POA: Diagnosis not present

## 2022-03-14 DIAGNOSIS — Z20822 Contact with and (suspected) exposure to covid-19: Secondary | ICD-10-CM | POA: Diagnosis not present

## 2022-03-16 ENCOUNTER — Ambulatory Visit (INDEPENDENT_AMBULATORY_CARE_PROVIDER_SITE_OTHER): Payer: Medicare Other

## 2022-03-16 DIAGNOSIS — E538 Deficiency of other specified B group vitamins: Secondary | ICD-10-CM | POA: Diagnosis not present

## 2022-03-16 MED ORDER — CYANOCOBALAMIN 1000 MCG/ML IJ SOLN
1000.0000 ug | Freq: Once | INTRAMUSCULAR | Status: AC
  Administered 2022-03-16: 1000 ug via INTRAMUSCULAR

## 2022-03-16 NOTE — Progress Notes (Signed)
Pt here for monthly B12 injection per Dr. Charlett Blake ? ?B12 1016mg given left IM, and pt tolerated injection well. ? ?Next B12 injection scheduled for  ? ?

## 2022-03-17 DIAGNOSIS — Z20822 Contact with and (suspected) exposure to covid-19: Secondary | ICD-10-CM | POA: Diagnosis not present

## 2022-03-24 DIAGNOSIS — Z20822 Contact with and (suspected) exposure to covid-19: Secondary | ICD-10-CM | POA: Diagnosis not present

## 2022-03-28 DIAGNOSIS — Z20822 Contact with and (suspected) exposure to covid-19: Secondary | ICD-10-CM | POA: Diagnosis not present

## 2022-03-30 DIAGNOSIS — Z20822 Contact with and (suspected) exposure to covid-19: Secondary | ICD-10-CM | POA: Diagnosis not present

## 2022-03-31 DIAGNOSIS — Z20822 Contact with and (suspected) exposure to covid-19: Secondary | ICD-10-CM | POA: Diagnosis not present

## 2022-04-04 DIAGNOSIS — Z20822 Contact with and (suspected) exposure to covid-19: Secondary | ICD-10-CM | POA: Diagnosis not present

## 2022-04-07 DIAGNOSIS — Z20822 Contact with and (suspected) exposure to covid-19: Secondary | ICD-10-CM | POA: Diagnosis not present

## 2022-04-14 ENCOUNTER — Ambulatory Visit: Payer: Medicare Other

## 2022-04-25 ENCOUNTER — Emergency Department (INDEPENDENT_AMBULATORY_CARE_PROVIDER_SITE_OTHER)
Admission: RE | Admit: 2022-04-25 | Discharge: 2022-04-25 | Disposition: A | Discharge status: Home / Self Care | Payer: Medicare Other | Source: Ambulatory Visit

## 2022-04-25 ENCOUNTER — Emergency Department (INDEPENDENT_AMBULATORY_CARE_PROVIDER_SITE_OTHER): Payer: Medicare Other

## 2022-04-25 VITALS — BP 102/52 | HR 82 | Temp 97.8°F | Resp 16

## 2022-04-25 DIAGNOSIS — M5430 Sciatica, unspecified side: Secondary | ICD-10-CM

## 2022-04-25 DIAGNOSIS — M62830 Muscle spasm of back: Secondary | ICD-10-CM | POA: Diagnosis not present

## 2022-04-25 DIAGNOSIS — M5442 Lumbago with sciatica, left side: Secondary | ICD-10-CM

## 2022-04-25 DIAGNOSIS — M5441 Lumbago with sciatica, right side: Secondary | ICD-10-CM | POA: Diagnosis not present

## 2022-04-25 DIAGNOSIS — M545 Low back pain, unspecified: Secondary | ICD-10-CM | POA: Diagnosis not present

## 2022-04-25 MED ORDER — BACLOFEN 10 MG PO TABS: 10.0000 mg | ORAL_TABLET | Freq: Three times a day (TID) | ORAL | 0 refills | Status: DC

## 2022-04-25 MED ORDER — CELECOXIB 100 MG PO CAPS: 100.0000 mg | ORAL_CAPSULE | Freq: Two times a day (BID) | ORAL | 0 refills | Status: AC

## 2022-04-25 MED ORDER — TRAMADOL HCL 50 MG PO TABS: 50.0000 mg | ORAL_TABLET | Freq: Every day | ORAL | 0 refills | Status: DC | PRN

## 2022-04-25 NOTE — ED Triage Notes (Signed)
Pt presents with bilateral lower back pain x 3days

## 2022-04-25 NOTE — Discharge Instructions (Addendum)
Advised/informed patient/daughter of lower back x-ray results with hard copy provided to them.  Instructed patient/daughter to discontinue morning Ibuprofen/Tylenol regimen for the next 2-3 weeks.  Advised patient may take medication (Celebrex)  daily for radiating low back pain.  Advised may use Tramadol for breakthrough pain, or as needed.  Advised may use Baclofen daily or as needed for accompanying back spasms.  Advised/encouraged patient to avoid offending activities; including repetitive motion activities, involving affected area of lower back for the next 7 to 10 days.  Advised/daughter if symptoms worsen and/or unresolved please follow-up with PCP or here for further evaluation.

## 2022-04-25 NOTE — ED Provider Notes (Signed)
[facility]    CSN: [id number] Arrival date & time: 04/25/22  1341      History   Chief Complaint Chief Complaint  Patient presents with   Back Pain    Entered by patient    HPI Daniel Reeves is a 88 y.o. male.   HPI Very pleasant 88 year old male presents with bilateral lower back pain for 3 days.  Patient is accompanied by his daughter this afternoon.  PMH significant for CAD, chronic systolic CHF, ischemic cardiomyopathy, AAA, lumbar back pain.  Patient's daughter reports has been working in his rock garden and yard for the past 2 days and may have overdone it with his lower back.  Past Medical History:  Diagnosis Date   Abdominal aortic aneurysm ([place])    a. [place] (1/14):  3.3 x 3.4 cm => f/u 11/2013   Amebic dysentery    AMEBIC DYSENTERY 11/19/2007   Qualifier: History of  By: Lenna Gilford MD, Deborra Medina    Anemia 03/07/2017   ANXIETY 11/19/2007   Qualifier: Diagnosis of  By: Lenna Gilford MD, Deborra Medina    Arthritis 03/07/2017   Atherosclerosis of coronary artery bypass graft with unstable angina pectoris ([place]) 04/19/2013   BACK PAIN, LUMBAR 11/16/2007   Qualifier: Diagnosis of  By: Julien Girt CMA, Leigh     Benign prostatic hypertrophy    BENIGN PROSTATIC HYPERTROPHY, HX OF 11/16/2007   Qualifier: Diagnosis of  By: Julien Girt CMA, Leigh     BRBPR (bright red blood per rectum) 11/01/2016   CAD (coronary artery disease)    a. s/p CABG in 1979 and 1993;  b. LHC (5/14):  LM, LAD, CFX and RCA occluded; L-LAD ok, dLAD occluded after insertion of LIMA, S-OM occluded, S-PDA/AM 80-90 => PCI with Promus DES; EF 25%   Cardiomyopathy, ischemic 06/05/2013   Cerumen impaction    Bilateral   Chicken pox as a child   Chronic systolic CHF (congestive heart failure) ([place])    COLONIC POLYPS 07/01/2008   Qualifier: Diagnosis of  By: Lenna Gilford MD, Scott M    Degenerative joint disease    DEGENERATIVE JOINT DISEASE 11/16/2007   Qualifier: Diagnosis of  By: Julien Girt CMA, Leigh     Diverticulosis of colon     DIVERTICULOSIS OF COLON 07/01/2008   Qualifier: Diagnosis of  By: Lenna Gilford MD, Deborra Medina    Double vision 03/07/2017   Essential hypertension 11/16/2007   Qualifier: Diagnosis of  By: Julien Girt CMA, Leigh     Fingernail abnormalities 03/07/2017   FLANK PAIN, RIGHT 02/03/2010   Qualifier: History of  By: Lenna Gilford MD, Deborra Medina    GERD (gastroesophageal reflux disease)    GOUT 11/16/2007   Qualifier: Diagnosis of  By: Julien Girt CMA, Leigh     Hearing loss 11/24/2014   Heart murmur    History of shingles 10/27/2017   Hypercholesterolemia    HYPERCHOLESTEROLEMIA 11/16/2007   Qualifier: Diagnosis of  By: Julien Girt CMA, Leigh     Ischemic cardiomyopathy    a. echo (09/05/13): EF 35%, diffuse HK worsened distal septal, mid/distal inferior and apical region, grade 1 diastolic dysfunction, mild LAE.     Kidney stone 08/17/2011   Loss of hearing    Lumbar back pain    Measles as a child   Medicare annual wellness visit, subsequent 11/24/2014   Sees Dr Delman Cheadle for dermatology Sees Dr Roni Bread of Urology Sees Dr Stanford Breed of cardiology Sees Dr Virginia Rochester of Opthamology No further colonoscopies warranted        Mumps  as a child   Nephrolithiasis    Pain in joint, lower leg 07/29/2014   PERIPHERAL VASCULAR DISEASE 11/16/2007   Qualifier: Diagnosis of  By: Julien Girt CMA, Leigh     Peripheral vascular disease ([place])    Rectal bleeding 03/07/2017   Shingles 07/29/2014   Sun-damaged skin 05/31/2014    Patient Active Problem List   Diagnosis Date Noted   Skin cancer 01/10/2022   Chronic renal disease 03/14/2020   Grief reaction 12/24/2019   Hyperglycemia 01/22/2019   Edema 12/17/2018   Right hip pain 12/17/2018   Foot pain, right 03/01/2018   Hemorrhoid 10/29/2017   History of shingles 10/27/2017   Anemia 03/07/2017   Medicare annual wellness visit, subsequent 11/24/2014   Hearing loss 11/24/2014   Pain in joint, lower leg 07/29/2014   Sun-damaged skin 05/31/2014   Cardiomyopathy, ischemic 06/05/2013   Kidney stone 08/17/2011    CAD (coronary artery disease)    FLANK PAIN, RIGHT 02/03/2010   Abdominal aortic aneurysm ([place]) 11/04/2009   COLONIC POLYPS 07/01/2008   Anxiety state 11/19/2007   HYPERCHOLESTEROLEMIA 11/16/2007   Gout 11/16/2007   Essential hypertension 11/16/2007   PERIPHERAL VASCULAR DISEASE 11/16/2007   GERD 11/16/2007   Osteoarthritis 11/16/2007   BACK PAIN, LUMBAR 11/16/2007   BPH (benign prostatic hyperplasia) 11/16/2007    Past Surgical History:  Procedure Laterality Date   CORONARY ANGIOPLASTY WITH STENT PLACEMENT  04/18/2013   RCA        CORONARY ARTERY BYPASS GRAFT  1979   x4 SVG-DIAG-LAD, SVG-OM-PDA   CORONARY ARTERY BYPASS GRAFT  1993   Redo x5 by Dr Harlow Asa; Durward Fortes, SVG-OM, SVG-AM-PL   Decompressive laminectomy  01/2006   L2 - scarum by Dr. Shellia Carwin   HEMORRHOID SURGERY     fissure with hemorrhoid corrected at age 32   [place]   Right by Dr Lonzo Cloud HERNIA REPAIR  1996   Left by Dr. Harlow Asa   LEFT HEART CATHETERIZATION WITH CORONARY ANGIOGRAM N/A 09/23/2013   Procedure: LEFT HEART CATHETERIZATION WITH CORONARY ANGIOGRAM;  Surgeon: Blane Ohara, MD;  Location: [facility] CATH LAB;  Service: Cardiovascular;  Laterality: N/A;   lens implants     for vision correction   PERCUTANEOUS CORONARY STENT INTERVENTION (PCI-S) N/A 04/18/2013   Procedure: PERCUTANEOUS CORONARY STENT INTERVENTION (PCI-S);  Surgeon: Sherren Mocha, MD;  Location: [facility] CATH LAB;  Service: Cardiovascular;  Laterality: N/A;   TONSILLECTOMY         Home Medications    Prior to Admission medications   Medication Sig Start Date End Date Taking? Authorizing Provider  baclofen (LIORESAL) 10 MG tablet Take 1 tablet (10 mg total) by mouth 3 (three) times daily. 04/25/22  Yes Eliezer Lofts, FNP  celecoxib (CELEBREX) 100 MG capsule Take 1 capsule (100 mg total) by mouth 2 (two) times daily for 15 days. 04/25/22 05/10/22 Yes Eliezer Lofts, FNP  Cholecalciferol (VITAMIN D3) 1.25 MG (50000  UT) CAPS Take by mouth.   Yes [provider]  traMADol (ULTRAM) 50 MG tablet Take 1 tablet (50 mg total) by mouth daily as needed. 04/25/22  Yes Eliezer Lofts, FNP  allopurinol (ZYLOPRIM) 300 MG tablet Take 1 tablet by mouth once daily Patient not taking: Reported on 04/25/2022 03/09/22   [name], MD  aspirin EC 81 MG tablet Take 81 mg by mouth every morning.     [provider]  b complex vitamins capsule Take 1 capsule by mouth daily.  [provider]  busPIRone (BUSPAR) 5 MG tablet Take 1 tablet (5 mg total) by mouth daily. 09/17/21   [name], MD  Cholecalciferol (VITAMIN D-3 PO) Take 5,000 Units by mouth daily with breakfast.    [provider]  cyanocobalamin (,VITAMIN B-12,) 1000 MCG/ML injection Inject 1,000 mcg into the muscle every 30 (thirty) days. Patient not taking: Reported on 04/25/2022 05/12/21   [provider]  famotidine (PEPCID) 20 MG tablet Take 1 tablet by mouth twice daily 02/08/22   [name], MD  finasteride (PROSCAR) 5 MG tablet Takes every third day    [provider]  folic acid (FOLVITE) 062 MCG tablet Take 400 mcg by mouth 2 (two) times daily.    [provider]  isosorbide mononitrate (IMDUR) 30 MG 24 hr tablet Take 3 tablets by mouth once daily 02/08/22   [name], MD  losartan (COZAAR) 50 MG tablet Take 1 tablet by mouth once daily 02/04/22   Lelon Perla, MD  metoprolol succinate (TOPROL-XL) 25 MG 24 hr tablet Take 1/2 (one-half) tablet by mouth once daily 09/23/21   Lelon Perla, MD  Misc Natural Products (OSTEO BI-FLEX ADV JOINT SHIELD) TABS Take 1 tablet by mouth 2 (two) times daily.    [provider]  Multiple Vitamin (MULTIVITAMIN) tablet Take 1 tablet by mouth daily.    [provider]  nitroGLYCERIN (NITROSTAT) 0.4 MG SL tablet DISSOLVE ONE TABLET UNDER THE TONGUE EVERY 5 MINUTES AS NEEDED FOR CHEST PAIN.  DO NOT EXCEED A TOTAL OF 3 DOSES IN 15  MINUTES 05/22/20   Lelon Perla, MD  simvastatin (ZOCOR) 40 MG tablet TAKE 1 TABLET BY MOUTH ONCE DAILY IN THE EVENING 02/04/22   [name], MD    Family History Family History  Problem Relation Age of Onset   Parkinsonism Brother    Diabetes Maternal Grandmother    Depression Daughter    Other Son        4 stents   Heart disease Son    Diabetes Son        type 2   Colon cancer Neg Hx    Esophageal cancer Neg Hx    Rectal cancer Neg Hx    Stomach cancer Neg Hx     Social History Social History   Tobacco Use   Smoking status: Former    Types: Cigarettes    Quit date: 11/28/1944    Years since quitting: 77.4   Smokeless tobacco: Never  Vaping Use   Vaping Use: Never used  Substance Use Topics   Alcohol use: Yes    Alcohol/week: 2.0 standard drinks    Types: 2 Standard drinks or equivalent per week    Comment: occasional alcohol use   Drug use: No     Allergies   Lisinopril, Methocarbamol, Other, Pregabalin, and Ramipril   Review of Systems Review of Systems  Musculoskeletal:  Positive for back pain.    Physical Exam Triage Vital Signs ED Triage Vitals  Enc Vitals Group     BP      Pulse      Resp      Temp      Temp src      SpO2      Weight      Height      Head Circumference      Peak Flow      Pain Score      Pain Loc  Pain Edu?      Excl. in [place]?    No data found.  Updated Vital Signs BP (!) 102/52 (BP Location: Left Arm)   Pulse 82   Temp 97.8 F (36.6 C) (Oral)   Resp 16   SpO2 97%      Physical Exam Vitals and nursing note reviewed.  Constitutional:      Appearance: Normal appearance. He is normal weight.  HENT:     Head: Normocephalic and atraumatic.     Mouth/Throat:     Mouth: Mucous membranes are moist.     Pharynx: Oropharynx is clear.  Eyes:     Extraocular Movements: Extraocular movements intact.     Conjunctiva/sclera: Conjunctivae normal.     Pupils: Pupils are equal, round, and reactive to light.   Cardiovascular:     Rate and Rhythm: Normal rate and regular rhythm.     Pulses: Normal pulses.     Heart sounds: Normal heart sounds.  Pulmonary:     Effort: Pulmonary effort is normal.     Breath sounds: Normal breath sounds. No wheezing, rhonchi or rales.  Musculoskeletal:     Cervical back: Normal range of motion and neck supple.     Comments: Lumbar sacral spine: TTP over spinous processes, bilateral paraspinous muscles, and bilateral spinal erectors  Neurological:     General: No focal deficit present.     Mental Status: He is alert and oriented to person, place, and time. Mental status is at baseline.     Cranial Nerves: No cranial nerve deficit.     Sensory: No sensory deficit.     Motor: No weakness.     Coordination: Coordination normal.     Comments: Patient is seated comfortably in wheelchair today     UC Treatments / Results  Labs (all labs ordered are listed, but only abnormal results are displayed) Labs Reviewed - No data to display  EKG   Radiology DG Lumbar Spine Complete  Result Date: 04/25/2022 CLINICAL DATA:  Low back pain with bilateral sciatica for 3 days. No injury. EXAM: LUMBAR SPINE - COMPLETE 4+ VIEW COMPARISON:  02/28/2020. FINDINGS: No fracture or bone lesion. No spondylolisthesis. Moderate dextroscoliosis, apex in the mid lumbar spine. Moderate loss of disc height at L2-L3 with moderate to marked loss of disc height at L3-L4 and L4-L5. Laminectomy defects along the mid to lower lumbar spine. Skeletal structures are diffusely demineralized. Dense aortic atherosclerotic calcifications are stable. IMPRESSION: 1. No fracture or acute finding. 2. Dextroscoliosis and significant degenerative changes as detailed, without change from the prior study. Electronically Signed   By: Lajean Manes M.D.   On: 04/25/2022 15:18    Procedures Procedures (including critical care time)  Medications Ordered in UC Medications - No data to display  Initial Impression /  Assessment and Plan / UC Course  I have reviewed the triage vital signs and the nursing notes.  Pertinent labs & imaging results that were available during my care of the patient were reviewed by me and considered in my medical decision making (see chart for details).     MDM: 1.  Sciatica, unspecified laterality-Rx'd Celebrex, Tramadol; 2.  Muscle spasms of back-Rx'd Baclofen. Advised/informed patient/daughter of lower back x-ray results with hard copy provided to them.  Instructed patient/daughter to discontinue morning Ibuprofen/Tylenol regimen for the next 2-3 weeks.  Advised patient may take medication (Celebrex)  daily for radiating low back pain.  Advised may use Tramadol for breakthrough pain, or as needed.  Advised may use Baclofen daily or as needed for accompanying back spasms.  Advised/encouraged patient to avoid offending activities; including repetitive motion activities, involving affected area of lower back for the next 7 to 10 days.  Advised/daughter if symptoms worsen and/or unresolved please follow-up with PCP or here for further evaluation.  Discharged home, hemodynamically stable. Final Clinical Impressions(s) / UC Diagnoses   Final diagnoses:  Sciatica, unspecified laterality  Muscle spasm of back     Discharge Instructions      Advised/informed patient/daughter of lower back x-ray results with hard copy provided to them.  Instructed patient/daughter to discontinue morning Ibuprofen/Tylenol regimen for the next 2-3 weeks.  Advised patient may take medication (Celebrex)  daily for radiating low back pain.  Advised may use Tramadol for breakthrough pain, or as needed.  Advised may use Baclofen daily or as needed for accompanying back spasms.  Advised/encouraged patient to avoid offending activities; including repetitive motion activities, involving affected area of lower back for the next 7 to 10 days.  Advised/daughter if symptoms worsen and/or unresolved please follow-up with  PCP or here for further evaluation.     ED Prescriptions     Medication Sig Dispense Auth. Provider   celecoxib (CELEBREX) 100 MG capsule Take 1 capsule (100 mg total) by mouth 2 (two) times daily for 15 days. 30 capsule Eliezer Lofts, FNP   baclofen (LIORESAL) 10 MG tablet Take 1 tablet (10 mg total) by mouth 3 (three) times daily. 30 each Eliezer Lofts, FNP   traMADol (ULTRAM) 50 MG tablet Take 1 tablet (50 mg total) by mouth daily as needed. 30 tablet Eliezer Lofts, FNP      I have reviewed the PDMP during this encounter.   Eliezer Lofts, Alexis 04/25/22 1552

## 2022-04-28 ENCOUNTER — Telehealth: Payer: Self-pay | Admitting: Family Medicine

## 2022-04-28 NOTE — Telephone Encounter (Signed)
Pt's daughter stated that her dad is having back pain and would like suggestions. Please contact the daughter's cell phone

## 2022-04-28 NOTE — Telephone Encounter (Signed)
Pt has upcoming appointment with you next week.

## 2022-04-29 DIAGNOSIS — E7800 Pure hypercholesterolemia, unspecified: Secondary | ICD-10-CM | POA: Diagnosis present

## 2022-04-29 DIAGNOSIS — M4156 Other secondary scoliosis, lumbar region: Secondary | ICD-10-CM | POA: Diagnosis present

## 2022-04-29 DIAGNOSIS — Y999 Unspecified external cause status: Secondary | ICD-10-CM | POA: Diagnosis not present

## 2022-04-29 DIAGNOSIS — I255 Ischemic cardiomyopathy: Secondary | ICD-10-CM | POA: Diagnosis present

## 2022-04-29 DIAGNOSIS — N189 Chronic kidney disease, unspecified: Secondary | ICD-10-CM | POA: Diagnosis present

## 2022-04-29 DIAGNOSIS — I714 Abdominal aortic aneurysm, without rupture, unspecified: Secondary | ICD-10-CM | POA: Diagnosis not present

## 2022-04-29 DIAGNOSIS — M549 Dorsalgia, unspecified: Secondary | ICD-10-CM | POA: Diagnosis not present

## 2022-04-29 DIAGNOSIS — M4846XA Fatigue fracture of vertebra, lumbar region, initial encounter for fracture: Secondary | ICD-10-CM | POA: Diagnosis present

## 2022-04-29 DIAGNOSIS — Z743 Need for continuous supervision: Secondary | ICD-10-CM | POA: Diagnosis not present

## 2022-04-29 DIAGNOSIS — K219 Gastro-esophageal reflux disease without esophagitis: Secondary | ICD-10-CM | POA: Diagnosis present

## 2022-04-29 DIAGNOSIS — I447 Left bundle-branch block, unspecified: Secondary | ICD-10-CM | POA: Diagnosis not present

## 2022-04-29 DIAGNOSIS — M1990 Unspecified osteoarthritis, unspecified site: Secondary | ICD-10-CM | POA: Diagnosis not present

## 2022-04-29 DIAGNOSIS — K5120 Ulcerative (chronic) proctitis without complications: Secondary | ICD-10-CM | POA: Diagnosis not present

## 2022-04-29 DIAGNOSIS — I5022 Chronic systolic (congestive) heart failure: Secondary | ICD-10-CM | POA: Diagnosis present

## 2022-04-29 DIAGNOSIS — T40425A Adverse effect of tramadol, initial encounter: Secondary | ICD-10-CM | POA: Diagnosis present

## 2022-04-29 DIAGNOSIS — Z951 Presence of aortocoronary bypass graft: Secondary | ICD-10-CM | POA: Diagnosis not present

## 2022-04-29 DIAGNOSIS — N281 Cyst of kidney, acquired: Secondary | ICD-10-CM | POA: Diagnosis not present

## 2022-04-29 DIAGNOSIS — Z955 Presence of coronary angioplasty implant and graft: Secondary | ICD-10-CM | POA: Diagnosis not present

## 2022-04-29 DIAGNOSIS — I10 Essential (primary) hypertension: Secondary | ICD-10-CM | POA: Diagnosis not present

## 2022-04-29 DIAGNOSIS — I2510 Atherosclerotic heart disease of native coronary artery without angina pectoris: Secondary | ICD-10-CM | POA: Diagnosis present

## 2022-04-29 DIAGNOSIS — Z681 Body mass index (BMI) 19 or less, adult: Secondary | ICD-10-CM | POA: Diagnosis not present

## 2022-04-29 DIAGNOSIS — F419 Anxiety disorder, unspecified: Secondary | ICD-10-CM | POA: Diagnosis not present

## 2022-04-29 DIAGNOSIS — Z87891 Personal history of nicotine dependence: Secondary | ICD-10-CM | POA: Diagnosis not present

## 2022-04-29 DIAGNOSIS — R4182 Altered mental status, unspecified: Secondary | ICD-10-CM | POA: Diagnosis not present

## 2022-04-29 DIAGNOSIS — S32019A Unspecified fracture of first lumbar vertebra, initial encounter for closed fracture: Secondary | ICD-10-CM | POA: Diagnosis not present

## 2022-04-29 DIAGNOSIS — S32039A Unspecified fracture of third lumbar vertebra, initial encounter for closed fracture: Secondary | ICD-10-CM | POA: Diagnosis not present

## 2022-04-29 DIAGNOSIS — S32019D Unspecified fracture of first lumbar vertebra, subsequent encounter for fracture with routine healing: Secondary | ICD-10-CM | POA: Diagnosis not present

## 2022-04-29 DIAGNOSIS — I7143 Infrarenal abdominal aortic aneurysm, without rupture: Secondary | ICD-10-CM | POA: Diagnosis not present

## 2022-04-29 DIAGNOSIS — R636 Underweight: Secondary | ICD-10-CM | POA: Diagnosis present

## 2022-04-29 DIAGNOSIS — S32010A Wedge compression fracture of first lumbar vertebra, initial encounter for closed fracture: Secondary | ICD-10-CM | POA: Diagnosis not present

## 2022-04-29 DIAGNOSIS — G9340 Encephalopathy, unspecified: Secondary | ICD-10-CM | POA: Diagnosis not present

## 2022-04-29 DIAGNOSIS — G928 Other toxic encephalopathy: Secondary | ICD-10-CM | POA: Diagnosis present

## 2022-04-29 DIAGNOSIS — E860 Dehydration: Secondary | ICD-10-CM | POA: Diagnosis not present

## 2022-04-29 DIAGNOSIS — I739 Peripheral vascular disease, unspecified: Secondary | ICD-10-CM | POA: Diagnosis not present

## 2022-04-29 DIAGNOSIS — J9811 Atelectasis: Secondary | ICD-10-CM | POA: Diagnosis not present

## 2022-04-29 DIAGNOSIS — D649 Anemia, unspecified: Secondary | ICD-10-CM | POA: Diagnosis present

## 2022-04-29 DIAGNOSIS — R32 Unspecified urinary incontinence: Secondary | ICD-10-CM | POA: Diagnosis present

## 2022-04-29 DIAGNOSIS — M47816 Spondylosis without myelopathy or radiculopathy, lumbar region: Secondary | ICD-10-CM | POA: Diagnosis present

## 2022-04-29 DIAGNOSIS — N400 Enlarged prostate without lower urinary tract symptoms: Secondary | ICD-10-CM | POA: Diagnosis not present

## 2022-04-29 DIAGNOSIS — K6289 Other specified diseases of anus and rectum: Secondary | ICD-10-CM | POA: Diagnosis not present

## 2022-04-29 DIAGNOSIS — I130 Hypertensive heart and chronic kidney disease with heart failure and stage 1 through stage 4 chronic kidney disease, or unspecified chronic kidney disease: Secondary | ICD-10-CM | POA: Diagnosis present

## 2022-04-29 DIAGNOSIS — I5020 Unspecified systolic (congestive) heart failure: Secondary | ICD-10-CM | POA: Diagnosis not present

## 2022-04-29 DIAGNOSIS — X58XXXA Exposure to other specified factors, initial encounter: Secondary | ICD-10-CM | POA: Diagnosis not present

## 2022-04-29 DIAGNOSIS — D72829 Elevated white blood cell count, unspecified: Secondary | ICD-10-CM | POA: Diagnosis not present

## 2022-04-29 DIAGNOSIS — T428X5A Adverse effect of antiparkinsonism drugs and other central muscle-tone depressants, initial encounter: Secondary | ICD-10-CM | POA: Diagnosis present

## 2022-04-29 DIAGNOSIS — I959 Hypotension, unspecified: Secondary | ICD-10-CM | POA: Diagnosis not present

## 2022-04-29 DIAGNOSIS — R410 Disorientation, unspecified: Secondary | ICD-10-CM | POA: Diagnosis not present

## 2022-04-29 DIAGNOSIS — R52 Pain, unspecified: Secondary | ICD-10-CM | POA: Diagnosis not present

## 2022-04-29 NOTE — Telephone Encounter (Signed)
Just FYI:  Spoke with pt's daughter and gave the recommendations. Pt is currently in the ED, pt's daughter states that he was seen in urgent care for back pain and they started him on some new meds (celebrex, tramadol, baclofen). His physical and cognitive abilities then began to decline after taking these meds. Normally, he is able to ambulate with no cognitive problems. Pt's daughter stated that after he started the medication he could no longer walk and he is confused. Pt didn't know who his daughter is or what his location is.   Pt is starting to come around after some fluids but is still confused. Pt's daughter will update [place] on any new developments.

## 2022-04-30 DIAGNOSIS — E860 Dehydration: Secondary | ICD-10-CM | POA: Diagnosis not present

## 2022-04-30 DIAGNOSIS — K6289 Other specified diseases of anus and rectum: Secondary | ICD-10-CM | POA: Diagnosis not present

## 2022-04-30 DIAGNOSIS — I714 Abdominal aortic aneurysm, without rupture, unspecified: Secondary | ICD-10-CM | POA: Diagnosis not present

## 2022-04-30 DIAGNOSIS — G9340 Encephalopathy, unspecified: Secondary | ICD-10-CM | POA: Diagnosis not present

## 2022-04-30 DIAGNOSIS — D72829 Elevated white blood cell count, unspecified: Secondary | ICD-10-CM | POA: Diagnosis not present

## 2022-04-30 DIAGNOSIS — S32019A Unspecified fracture of first lumbar vertebra, initial encounter for closed fracture: Secondary | ICD-10-CM | POA: Diagnosis not present

## 2022-04-30 DIAGNOSIS — S32039A Unspecified fracture of third lumbar vertebra, initial encounter for closed fracture: Secondary | ICD-10-CM | POA: Diagnosis not present

## 2022-04-30 DIAGNOSIS — I10 Essential (primary) hypertension: Secondary | ICD-10-CM | POA: Diagnosis not present

## 2022-05-01 DIAGNOSIS — F419 Anxiety disorder, unspecified: Secondary | ICD-10-CM | POA: Diagnosis not present

## 2022-05-01 DIAGNOSIS — T40425A Adverse effect of tramadol, initial encounter: Secondary | ICD-10-CM | POA: Diagnosis present

## 2022-05-01 DIAGNOSIS — S32019D Unspecified fracture of first lumbar vertebra, subsequent encounter for fracture with routine healing: Secondary | ICD-10-CM | POA: Diagnosis not present

## 2022-05-01 DIAGNOSIS — X58XXXA Exposure to other specified factors, initial encounter: Secondary | ICD-10-CM | POA: Diagnosis not present

## 2022-05-01 DIAGNOSIS — I5020 Unspecified systolic (congestive) heart failure: Secondary | ICD-10-CM | POA: Diagnosis not present

## 2022-05-01 DIAGNOSIS — N189 Chronic kidney disease, unspecified: Secondary | ICD-10-CM | POA: Diagnosis present

## 2022-05-01 DIAGNOSIS — S32039A Unspecified fracture of third lumbar vertebra, initial encounter for closed fracture: Secondary | ICD-10-CM | POA: Diagnosis not present

## 2022-05-01 DIAGNOSIS — E7800 Pure hypercholesterolemia, unspecified: Secondary | ICD-10-CM | POA: Diagnosis present

## 2022-05-01 DIAGNOSIS — I2510 Atherosclerotic heart disease of native coronary artery without angina pectoris: Secondary | ICD-10-CM | POA: Diagnosis present

## 2022-05-01 DIAGNOSIS — I255 Ischemic cardiomyopathy: Secondary | ICD-10-CM | POA: Diagnosis present

## 2022-05-01 DIAGNOSIS — M1990 Unspecified osteoarthritis, unspecified site: Secondary | ICD-10-CM | POA: Diagnosis not present

## 2022-05-01 DIAGNOSIS — I714 Abdominal aortic aneurysm, without rupture, unspecified: Secondary | ICD-10-CM | POA: Diagnosis not present

## 2022-05-01 DIAGNOSIS — Z681 Body mass index (BMI) 19 or less, adult: Secondary | ICD-10-CM | POA: Diagnosis not present

## 2022-05-01 DIAGNOSIS — D72829 Elevated white blood cell count, unspecified: Secondary | ICD-10-CM | POA: Diagnosis not present

## 2022-05-01 DIAGNOSIS — D649 Anemia, unspecified: Secondary | ICD-10-CM | POA: Diagnosis present

## 2022-05-01 DIAGNOSIS — E860 Dehydration: Secondary | ICD-10-CM | POA: Diagnosis present

## 2022-05-01 DIAGNOSIS — T428X5A Adverse effect of antiparkinsonism drugs and other central muscle-tone depressants, initial encounter: Secondary | ICD-10-CM | POA: Diagnosis present

## 2022-05-01 DIAGNOSIS — G9340 Encephalopathy, unspecified: Secondary | ICD-10-CM | POA: Diagnosis not present

## 2022-05-01 DIAGNOSIS — I130 Hypertensive heart and chronic kidney disease with heart failure and stage 1 through stage 4 chronic kidney disease, or unspecified chronic kidney disease: Secondary | ICD-10-CM | POA: Diagnosis present

## 2022-05-01 DIAGNOSIS — I10 Essential (primary) hypertension: Secondary | ICD-10-CM | POA: Diagnosis not present

## 2022-05-01 DIAGNOSIS — S32010A Wedge compression fracture of first lumbar vertebra, initial encounter for closed fracture: Secondary | ICD-10-CM | POA: Diagnosis not present

## 2022-05-01 DIAGNOSIS — Z743 Need for continuous supervision: Secondary | ICD-10-CM | POA: Diagnosis not present

## 2022-05-01 DIAGNOSIS — K6289 Other specified diseases of anus and rectum: Secondary | ICD-10-CM | POA: Diagnosis present

## 2022-05-01 DIAGNOSIS — R32 Unspecified urinary incontinence: Secondary | ICD-10-CM | POA: Diagnosis present

## 2022-05-01 DIAGNOSIS — S32019A Unspecified fracture of first lumbar vertebra, initial encounter for closed fracture: Secondary | ICD-10-CM | POA: Diagnosis not present

## 2022-05-01 DIAGNOSIS — M4846XA Fatigue fracture of vertebra, lumbar region, initial encounter for fracture: Secondary | ICD-10-CM | POA: Diagnosis present

## 2022-05-01 DIAGNOSIS — Z87891 Personal history of nicotine dependence: Secondary | ICD-10-CM | POA: Diagnosis not present

## 2022-05-01 DIAGNOSIS — M47816 Spondylosis without myelopathy or radiculopathy, lumbar region: Secondary | ICD-10-CM | POA: Diagnosis present

## 2022-05-01 DIAGNOSIS — R636 Underweight: Secondary | ICD-10-CM | POA: Diagnosis present

## 2022-05-01 DIAGNOSIS — M4156 Other secondary scoliosis, lumbar region: Secondary | ICD-10-CM | POA: Diagnosis present

## 2022-05-01 DIAGNOSIS — G928 Other toxic encephalopathy: Secondary | ICD-10-CM | POA: Diagnosis present

## 2022-05-01 DIAGNOSIS — I5022 Chronic systolic (congestive) heart failure: Secondary | ICD-10-CM | POA: Diagnosis present

## 2022-05-01 DIAGNOSIS — K5120 Ulcerative (chronic) proctitis without complications: Secondary | ICD-10-CM | POA: Diagnosis not present

## 2022-05-01 DIAGNOSIS — I739 Peripheral vascular disease, unspecified: Secondary | ICD-10-CM | POA: Diagnosis not present

## 2022-05-01 DIAGNOSIS — I959 Hypotension, unspecified: Secondary | ICD-10-CM | POA: Diagnosis not present

## 2022-05-01 DIAGNOSIS — N400 Enlarged prostate without lower urinary tract symptoms: Secondary | ICD-10-CM | POA: Diagnosis present

## 2022-05-01 DIAGNOSIS — I7143 Infrarenal abdominal aortic aneurysm, without rupture: Secondary | ICD-10-CM | POA: Diagnosis present

## 2022-05-01 DIAGNOSIS — Z951 Presence of aortocoronary bypass graft: Secondary | ICD-10-CM | POA: Diagnosis not present

## 2022-05-01 DIAGNOSIS — K219 Gastro-esophageal reflux disease without esophagitis: Secondary | ICD-10-CM | POA: Diagnosis present

## 2022-05-01 DIAGNOSIS — Z955 Presence of coronary angioplasty implant and graft: Secondary | ICD-10-CM | POA: Diagnosis not present

## 2022-05-03 ENCOUNTER — Ambulatory Visit: Payer: Medicare Other | Admitting: Family Medicine

## 2022-05-03 DIAGNOSIS — I959 Hypotension, unspecified: Secondary | ICD-10-CM | POA: Diagnosis not present

## 2022-05-03 DIAGNOSIS — X58XXXA Exposure to other specified factors, initial encounter: Secondary | ICD-10-CM | POA: Diagnosis not present

## 2022-05-03 DIAGNOSIS — F419 Anxiety disorder, unspecified: Secondary | ICD-10-CM | POA: Diagnosis not present

## 2022-05-03 DIAGNOSIS — M1990 Unspecified osteoarthritis, unspecified site: Secondary | ICD-10-CM | POA: Diagnosis not present

## 2022-05-03 DIAGNOSIS — I739 Peripheral vascular disease, unspecified: Secondary | ICD-10-CM | POA: Diagnosis not present

## 2022-05-03 DIAGNOSIS — I255 Ischemic cardiomyopathy: Secondary | ICD-10-CM | POA: Diagnosis not present

## 2022-05-03 DIAGNOSIS — D72829 Elevated white blood cell count, unspecified: Secondary | ICD-10-CM | POA: Diagnosis not present

## 2022-05-03 DIAGNOSIS — K5120 Ulcerative (chronic) proctitis without complications: Secondary | ICD-10-CM | POA: Diagnosis not present

## 2022-05-03 DIAGNOSIS — D649 Anemia, unspecified: Secondary | ICD-10-CM | POA: Diagnosis not present

## 2022-05-03 DIAGNOSIS — I2510 Atherosclerotic heart disease of native coronary artery without angina pectoris: Secondary | ICD-10-CM | POA: Diagnosis not present

## 2022-05-03 DIAGNOSIS — G9340 Encephalopathy, unspecified: Secondary | ICD-10-CM | POA: Diagnosis not present

## 2022-05-03 DIAGNOSIS — I10 Essential (primary) hypertension: Secondary | ICD-10-CM | POA: Diagnosis not present

## 2022-05-03 DIAGNOSIS — N400 Enlarged prostate without lower urinary tract symptoms: Secondary | ICD-10-CM | POA: Diagnosis not present

## 2022-05-03 DIAGNOSIS — E860 Dehydration: Secondary | ICD-10-CM | POA: Diagnosis not present

## 2022-05-03 DIAGNOSIS — N189 Chronic kidney disease, unspecified: Secondary | ICD-10-CM | POA: Diagnosis not present

## 2022-05-03 DIAGNOSIS — K6289 Other specified diseases of anus and rectum: Secondary | ICD-10-CM | POA: Diagnosis not present

## 2022-05-03 DIAGNOSIS — Z955 Presence of coronary angioplasty implant and graft: Secondary | ICD-10-CM | POA: Diagnosis not present

## 2022-05-03 DIAGNOSIS — M1A09X0 Idiopathic chronic gout, multiple sites, without tophus (tophi): Secondary | ICD-10-CM | POA: Diagnosis not present

## 2022-05-03 DIAGNOSIS — I7143 Infrarenal abdominal aortic aneurysm, without rupture: Secondary | ICD-10-CM | POA: Diagnosis not present

## 2022-05-03 DIAGNOSIS — E7800 Pure hypercholesterolemia, unspecified: Secondary | ICD-10-CM | POA: Diagnosis not present

## 2022-05-03 DIAGNOSIS — M8008XA Age-related osteoporosis with current pathological fracture, vertebra(e), initial encounter for fracture: Secondary | ICD-10-CM | POA: Diagnosis not present

## 2022-05-03 DIAGNOSIS — I714 Abdominal aortic aneurysm, without rupture, unspecified: Secondary | ICD-10-CM | POA: Diagnosis not present

## 2022-05-03 DIAGNOSIS — K219 Gastro-esophageal reflux disease without esophagitis: Secondary | ICD-10-CM | POA: Diagnosis not present

## 2022-05-03 DIAGNOSIS — I5020 Unspecified systolic (congestive) heart failure: Secondary | ICD-10-CM | POA: Diagnosis not present

## 2022-05-03 DIAGNOSIS — S32039A Unspecified fracture of third lumbar vertebra, initial encounter for closed fracture: Secondary | ICD-10-CM | POA: Diagnosis not present

## 2022-05-03 DIAGNOSIS — S32019D Unspecified fracture of first lumbar vertebra, subsequent encounter for fracture with routine healing: Secondary | ICD-10-CM | POA: Diagnosis not present

## 2022-05-03 DIAGNOSIS — S32019A Unspecified fracture of first lumbar vertebra, initial encounter for closed fracture: Secondary | ICD-10-CM | POA: Diagnosis not present

## 2022-05-03 DIAGNOSIS — I130 Hypertensive heart and chronic kidney disease with heart failure and stage 1 through stage 4 chronic kidney disease, or unspecified chronic kidney disease: Secondary | ICD-10-CM | POA: Diagnosis not present

## 2022-05-03 DIAGNOSIS — Z743 Need for continuous supervision: Secondary | ICD-10-CM | POA: Diagnosis not present

## 2022-05-04 DIAGNOSIS — I10 Essential (primary) hypertension: Secondary | ICD-10-CM | POA: Diagnosis not present

## 2022-05-04 DIAGNOSIS — K219 Gastro-esophageal reflux disease without esophagitis: Secondary | ICD-10-CM | POA: Diagnosis not present

## 2022-05-04 DIAGNOSIS — M1A09X0 Idiopathic chronic gout, multiple sites, without tophus (tophi): Secondary | ICD-10-CM | POA: Diagnosis not present

## 2022-05-04 DIAGNOSIS — I7143 Infrarenal abdominal aortic aneurysm, without rupture: Secondary | ICD-10-CM | POA: Diagnosis not present

## 2022-05-04 DIAGNOSIS — F419 Anxiety disorder, unspecified: Secondary | ICD-10-CM | POA: Diagnosis not present

## 2022-05-19 DIAGNOSIS — M8008XA Age-related osteoporosis with current pathological fracture, vertebra(e), initial encounter for fracture: Secondary | ICD-10-CM | POA: Diagnosis not present

## 2022-05-20 ENCOUNTER — Telehealth: Payer: Self-pay | Admitting: Family Medicine

## 2022-05-21 DIAGNOSIS — D631 Anemia in chronic kidney disease: Secondary | ICD-10-CM | POA: Diagnosis not present

## 2022-05-21 DIAGNOSIS — I130 Hypertensive heart and chronic kidney disease with heart failure and stage 1 through stage 4 chronic kidney disease, or unspecified chronic kidney disease: Secondary | ICD-10-CM | POA: Diagnosis not present

## 2022-05-21 DIAGNOSIS — G9340 Encephalopathy, unspecified: Secondary | ICD-10-CM | POA: Diagnosis not present

## 2022-05-21 DIAGNOSIS — I739 Peripheral vascular disease, unspecified: Secondary | ICD-10-CM | POA: Diagnosis not present

## 2022-05-21 DIAGNOSIS — K219 Gastro-esophageal reflux disease without esophagitis: Secondary | ICD-10-CM | POA: Diagnosis not present

## 2022-05-21 DIAGNOSIS — I5022 Chronic systolic (congestive) heart failure: Secondary | ICD-10-CM | POA: Diagnosis not present

## 2022-05-21 DIAGNOSIS — N189 Chronic kidney disease, unspecified: Secondary | ICD-10-CM | POA: Diagnosis not present

## 2022-05-21 DIAGNOSIS — F32A Depression, unspecified: Secondary | ICD-10-CM | POA: Diagnosis not present

## 2022-05-21 DIAGNOSIS — Z87891 Personal history of nicotine dependence: Secondary | ICD-10-CM | POA: Diagnosis not present

## 2022-05-21 DIAGNOSIS — I7143 Infrarenal abdominal aortic aneurysm, without rupture: Secondary | ICD-10-CM | POA: Diagnosis not present

## 2022-05-21 DIAGNOSIS — I2510 Atherosclerotic heart disease of native coronary artery without angina pectoris: Secondary | ICD-10-CM | POA: Diagnosis not present

## 2022-05-21 DIAGNOSIS — I255 Ischemic cardiomyopathy: Secondary | ICD-10-CM | POA: Diagnosis not present

## 2022-05-21 DIAGNOSIS — Z9181 History of falling: Secondary | ICD-10-CM | POA: Diagnosis not present

## 2022-05-21 DIAGNOSIS — M1030 Gout due to renal impairment, unspecified site: Secondary | ICD-10-CM | POA: Diagnosis not present

## 2022-05-21 DIAGNOSIS — F419 Anxiety disorder, unspecified: Secondary | ICD-10-CM | POA: Diagnosis not present

## 2022-05-21 DIAGNOSIS — M4856XD Collapsed vertebra, not elsewhere classified, lumbar region, subsequent encounter for fracture with routine healing: Secondary | ICD-10-CM | POA: Diagnosis not present

## 2022-05-23 ENCOUNTER — Telehealth: Payer: Self-pay | Admitting: Family Medicine

## 2022-05-23 ENCOUNTER — Telehealth: Payer: Self-pay

## 2022-05-24 DIAGNOSIS — I5022 Chronic systolic (congestive) heart failure: Secondary | ICD-10-CM | POA: Diagnosis not present

## 2022-05-24 DIAGNOSIS — I2510 Atherosclerotic heart disease of native coronary artery without angina pectoris: Secondary | ICD-10-CM | POA: Diagnosis not present

## 2022-05-24 DIAGNOSIS — I130 Hypertensive heart and chronic kidney disease with heart failure and stage 1 through stage 4 chronic kidney disease, or unspecified chronic kidney disease: Secondary | ICD-10-CM | POA: Diagnosis not present

## 2022-05-24 DIAGNOSIS — M4856XD Collapsed vertebra, not elsewhere classified, lumbar region, subsequent encounter for fracture with routine healing: Secondary | ICD-10-CM | POA: Diagnosis not present

## 2022-05-24 DIAGNOSIS — I7143 Infrarenal abdominal aortic aneurysm, without rupture: Secondary | ICD-10-CM | POA: Diagnosis not present

## 2022-05-24 DIAGNOSIS — N189 Chronic kidney disease, unspecified: Secondary | ICD-10-CM | POA: Diagnosis not present

## 2022-05-25 DIAGNOSIS — M4856XD Collapsed vertebra, not elsewhere classified, lumbar region, subsequent encounter for fracture with routine healing: Secondary | ICD-10-CM | POA: Diagnosis not present

## 2022-05-25 DIAGNOSIS — I130 Hypertensive heart and chronic kidney disease with heart failure and stage 1 through stage 4 chronic kidney disease, or unspecified chronic kidney disease: Secondary | ICD-10-CM | POA: Diagnosis not present

## 2022-05-25 DIAGNOSIS — I7143 Infrarenal abdominal aortic aneurysm, without rupture: Secondary | ICD-10-CM | POA: Diagnosis not present

## 2022-05-25 DIAGNOSIS — N189 Chronic kidney disease, unspecified: Secondary | ICD-10-CM | POA: Diagnosis not present

## 2022-05-25 DIAGNOSIS — I5022 Chronic systolic (congestive) heart failure: Secondary | ICD-10-CM | POA: Diagnosis not present

## 2022-05-25 DIAGNOSIS — I2510 Atherosclerotic heart disease of native coronary artery without angina pectoris: Secondary | ICD-10-CM | POA: Diagnosis not present

## 2022-05-27 DIAGNOSIS — I2510 Atherosclerotic heart disease of native coronary artery without angina pectoris: Secondary | ICD-10-CM | POA: Diagnosis not present

## 2022-05-27 DIAGNOSIS — I130 Hypertensive heart and chronic kidney disease with heart failure and stage 1 through stage 4 chronic kidney disease, or unspecified chronic kidney disease: Secondary | ICD-10-CM | POA: Diagnosis not present

## 2022-05-27 DIAGNOSIS — I7143 Infrarenal abdominal aortic aneurysm, without rupture: Secondary | ICD-10-CM | POA: Diagnosis not present

## 2022-05-27 DIAGNOSIS — I5022 Chronic systolic (congestive) heart failure: Secondary | ICD-10-CM | POA: Diagnosis not present

## 2022-05-27 DIAGNOSIS — M4856XD Collapsed vertebra, not elsewhere classified, lumbar region, subsequent encounter for fracture with routine healing: Secondary | ICD-10-CM | POA: Diagnosis not present

## 2022-05-27 DIAGNOSIS — N189 Chronic kidney disease, unspecified: Secondary | ICD-10-CM | POA: Diagnosis not present

## 2022-05-30 ENCOUNTER — Encounter: Payer: Self-pay | Admitting: Family Medicine

## 2022-05-30 ENCOUNTER — Other Ambulatory Visit: Payer: Self-pay | Admitting: Family Medicine

## 2022-05-30 ENCOUNTER — Ambulatory Visit (INDEPENDENT_AMBULATORY_CARE_PROVIDER_SITE_OTHER): Payer: Medicare Other | Admitting: Family Medicine

## 2022-05-30 VITALS — BP 116/56 | HR 59 | Ht 72.0 in

## 2022-05-30 DIAGNOSIS — I7143 Infrarenal abdominal aortic aneurysm, without rupture: Secondary | ICD-10-CM | POA: Diagnosis not present

## 2022-05-30 DIAGNOSIS — I5022 Chronic systolic (congestive) heart failure: Secondary | ICD-10-CM | POA: Diagnosis not present

## 2022-05-30 DIAGNOSIS — M4856XD Collapsed vertebra, not elsewhere classified, lumbar region, subsequent encounter for fracture with routine healing: Secondary | ICD-10-CM | POA: Diagnosis not present

## 2022-05-30 DIAGNOSIS — N189 Chronic kidney disease, unspecified: Secondary | ICD-10-CM | POA: Diagnosis not present

## 2022-05-30 DIAGNOSIS — I2510 Atherosclerotic heart disease of native coronary artery without angina pectoris: Secondary | ICD-10-CM | POA: Diagnosis not present

## 2022-05-30 DIAGNOSIS — I130 Hypertensive heart and chronic kidney disease with heart failure and stage 1 through stage 4 chronic kidney disease, or unspecified chronic kidney disease: Secondary | ICD-10-CM | POA: Diagnosis not present

## 2022-05-30 DIAGNOSIS — M545 Low back pain, unspecified: Secondary | ICD-10-CM | POA: Diagnosis not present

## 2022-05-30 DIAGNOSIS — I714 Abdominal aortic aneurysm, without rupture, unspecified: Secondary | ICD-10-CM | POA: Diagnosis not present

## 2022-05-30 DIAGNOSIS — Z09 Encounter for follow-up examination after completed treatment for conditions other than malignant neoplasm: Secondary | ICD-10-CM

## 2022-05-30 LAB — COMPREHENSIVE METABOLIC PANEL
ALT: 12 U/L (ref 0–53)
AST: 19 U/L (ref 0–37)
Albumin: 4 g/dL (ref 3.5–5.2)
Alkaline Phosphatase: 89 U/L (ref 39–117)
BUN: 22 mg/dL (ref 6–23)
CO2: 24 mEq/L (ref 19–32)
Calcium: 9.4 mg/dL (ref 8.4–10.5)
Chloride: 103 mEq/L (ref 96–112)
Creatinine, Ser: 1.09 mg/dL (ref 0.40–1.50)
GFR: 57.43 mL/min — ABNORMAL LOW (ref >60.00)
Glucose, Bld: 92 mg/dL (ref 70–99)
Potassium: 4.3 mEq/L (ref 3.5–5.1)
Sodium: 137 mEq/L (ref 135–145)
Total Bilirubin: 1 mg/dL (ref 0.2–1.2)
Total Protein: 6.1 g/dL (ref 6.0–8.3)

## 2022-05-30 LAB — CBC
HCT: 34.1 % — ABNORMAL LOW (ref 39.0–52.0)
Hemoglobin: 11.4 g/dL — ABNORMAL LOW (ref 13.0–17.0)
MCHC: 33.6 g/dL (ref 30.0–36.0)
MCV: 99.3 fl (ref 78.0–100.0)
Platelets: 197 10*3/uL (ref 150.0–400.0)
RBC: 3.43 Mil/uL — ABNORMAL LOW (ref 4.22–5.81)
RDW: 15 % (ref 11.5–15.5)
WBC: 7.1 10*3/uL (ref 4.0–10.5)

## 2022-05-30 NOTE — Assessment & Plan Note (Signed)
Continue tylenol/ibuprofen for pain management - discussed monitoring and safety. Keep upcoming follow-up with spine specialists, PT, OT, etc. Patient aware of signs/symptoms requiring further/urgent evaluation.

## 2022-05-30 NOTE — Assessment & Plan Note (Signed)
Recommend following up with Dr. Jeffie Pollock, based on hospital findings. Patient aware of signs/symptoms requiring further/urgent evaluation.

## 2022-05-30 NOTE — Progress Notes (Signed)
Acute Office Visit  Subjective:     Patient ID: [place], male    DOB: 1924-10-26, 88 y.o.   MRN: [id number]   CC: hospital follow-up   HPI Patient is in today for hospital follow-up.   Admission: 04/29/22 ([place]) Discharge: 05/03/22  Patient was admitted for altered mental status related to sedating medications. He had previously hurt his back working in the yard and was given tramadol and baclofen from urgent care. Since then he progressively worsened, became incontinent of stool/urine and unable ro ambulated along with increasing confusion.  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 Chronic L3 fracture. Patient improved after IV hydration and mentation returned to baseline.  For acute L1 and chronic L3 fractures, spine surgery recommended pain control, PT, OT, and outpatient follow-up to discuss kyphoplasty. Rehab was recommended at hospital discharge. Vascular surgery was consulted for AAA and family deiced 2 week follow-up to further discuss after allowing time for patient's mental status to improve.  Daughter brings him to appointment today.  He was at [facility] rehab for about 20 days. He is feeling better, but "worn out." Mentation is back to baseline. They have one more [facility] OT visit and maybe another week of PT. Then he is looking forward to getting back into his usual routine, nap schedule, etc.   They have already followed with spine doctor. Area is healing, but kyphoplasty no longer an option because of collapse. They follow-up with him in 2 months to ensure it is healing appropriately. While in rehab he started PT, but had no pain medications. He has restarted his regular  Tylenol, but would like to restart his prior ibuprofen since he is no longer on the Celebrex.  They had talked to vascular (Dr. Jeffie Pollock)  prior to admission and decided they would continue to monitor annually as surgery was not recommended quite yet based on risks with his age. Since patient had already decided he wasn't interested in surgery, they were not going to monitor more frequently than that, therefore they didn't want to proceed with any operation in hospital.  They are going to contact Dr. Jeffie Pollock and keep him updated and schedule appropriate follow-up.     ROS All review of systems negative except what is listed in the HPI      Objective:    BP (!) 116/56   Pulse (!) 59   Ht 6' (1.829 m)   BMI 19.80 kg/m    Physical Exam Vitals reviewed.  Constitutional:      Appearance: Normal appearance.  HENT:     Head: Normocephalic and atraumatic.  Cardiovascular:     Rate and Rhythm: Normal rate and regular rhythm.  Pulmonary:     Effort: Pulmonary effort is normal.     Breath sounds: Normal breath sounds.  Musculoskeletal:     Right lower leg: No edema.     Left lower leg: No edema.     Comments: Back brace in place  Skin:    General: Skin is warm and dry.  Neurological:     General: No focal deficit present.     Mental Status: He is alert and oriented to person, place, and time. Mental status is at baseline.  Psychiatric:  Mood and Affect: Mood normal.        Behavior: Behavior normal.        Thought Content: Thought content normal.        Judgment: Judgment normal.         No results found for any visits on 05/30/22.      Assessment & Plan:   Problem List Items Addressed This Visit       Cardiovascular and Mediastinum   Abdominal aortic aneurysm ([place])    Recommend following up with Dr. Jeffie Pollock, based on hospital findings. Patient aware of signs/symptoms requiring further/urgent evaluation.       Relevant Orders   CBC   Comprehensive metabolic  panel     Other   BACK PAIN, LUMBAR    Continue tylenol/ibuprofen for pain management - discussed monitoring and safety. Keep upcoming follow-up with spine specialists, PT, OT, etc. Patient aware of signs/symptoms requiring further/urgent evaluation.       Relevant Orders   CBC   Comprehensive metabolic panel   Other Visit Diagnoses     Hospital discharge follow-up    -  Primary Glad you are making improvement! Rechecking a few labs today. Continue your meds as prescribed. Okay to use occasional ibuprofen with tylenol for pain management.  Reach out to Dr. Jeffie Pollock regarding follow-up on the aneurysm.    Relevant Orders   CBC   Comprehensive metabolic panel       No orders of the defined types were placed in this encounter.   Return in about 2 months (around 07/31/2022) for PCP routine follow-up, schedule next B12 shot anytime .  I spent 30 minutes dedicated to the care of this patient on the date of this encounter to include pre-visit chart review of prior notes and results, face-to-face time with the patient performing a medically appropriate exam, counseling/education regarding medications, follow-up, and post-visit documentation and ordering of labs as indicated.    Terrilyn Saver, NP

## 2022-05-30 NOTE — Patient Instructions (Signed)
Glad you are making improvement! Rechecking a few labs today. Continue your meds as prescribed. Okay to use occasional ibuprofen with tylenol for pain management.  Reach out to Dr. Jeffie Pollock regarding follow-up on the aneurysm.

## 2022-06-01 ENCOUNTER — Encounter: Payer: Self-pay | Admitting: *Deleted

## 2022-06-01 ENCOUNTER — Telehealth: Payer: Self-pay | Admitting: Family Medicine

## 2022-06-01 DIAGNOSIS — I130 Hypertensive heart and chronic kidney disease with heart failure and stage 1 through stage 4 chronic kidney disease, or unspecified chronic kidney disease: Secondary | ICD-10-CM | POA: Diagnosis not present

## 2022-06-01 DIAGNOSIS — I2510 Atherosclerotic heart disease of native coronary artery without angina pectoris: Secondary | ICD-10-CM | POA: Diagnosis not present

## 2022-06-01 DIAGNOSIS — N189 Chronic kidney disease, unspecified: Secondary | ICD-10-CM | POA: Diagnosis not present

## 2022-06-01 DIAGNOSIS — I5022 Chronic systolic (congestive) heart failure: Secondary | ICD-10-CM | POA: Diagnosis not present

## 2022-06-01 DIAGNOSIS — M4856XD Collapsed vertebra, not elsewhere classified, lumbar region, subsequent encounter for fracture with routine healing: Secondary | ICD-10-CM | POA: Diagnosis not present

## 2022-06-01 DIAGNOSIS — I7143 Infrarenal abdominal aortic aneurysm, without rupture: Secondary | ICD-10-CM | POA: Diagnosis not present

## 2022-06-01 NOTE — Telephone Encounter (Signed)
Discharge from OT discussed at last OV and family agrees. Gave the verbal order to ok discharge of OT.

## 2022-06-01 NOTE — Telephone Encounter (Signed)
Daniel Reeves [facility] OT) called stating the pt is requesting discharge of OT services.

## 2022-06-02 DIAGNOSIS — I7143 Infrarenal abdominal aortic aneurysm, without rupture: Secondary | ICD-10-CM | POA: Diagnosis not present

## 2022-06-02 DIAGNOSIS — N189 Chronic kidney disease, unspecified: Secondary | ICD-10-CM | POA: Diagnosis not present

## 2022-06-02 DIAGNOSIS — I130 Hypertensive heart and chronic kidney disease with heart failure and stage 1 through stage 4 chronic kidney disease, or unspecified chronic kidney disease: Secondary | ICD-10-CM | POA: Diagnosis not present

## 2022-06-02 DIAGNOSIS — I5022 Chronic systolic (congestive) heart failure: Secondary | ICD-10-CM | POA: Diagnosis not present

## 2022-06-02 DIAGNOSIS — M4856XD Collapsed vertebra, not elsewhere classified, lumbar region, subsequent encounter for fracture with routine healing: Secondary | ICD-10-CM | POA: Diagnosis not present

## 2022-06-02 DIAGNOSIS — I2510 Atherosclerotic heart disease of native coronary artery without angina pectoris: Secondary | ICD-10-CM | POA: Diagnosis not present

## 2022-06-07 ENCOUNTER — Other Ambulatory Visit: Payer: Self-pay | Admitting: Family Medicine

## 2022-06-10 ENCOUNTER — Ambulatory Visit (INDEPENDENT_AMBULATORY_CARE_PROVIDER_SITE_OTHER): Payer: Medicare Other

## 2022-06-10 DIAGNOSIS — E538 Deficiency of other specified B group vitamins: Secondary | ICD-10-CM | POA: Diagnosis not present

## 2022-06-10 NOTE — Progress Notes (Signed)
Daniel Reeves is a 88 y.o. male  presents to the office today for a B12 injection, per physician's orders. Original order: per [name], MD  cyanocobalamin, 1000 mg/ml IM was administered in left deltoid today.   Patient tolerated injection well.   Next appointment:  07/18/22

## 2022-06-11 ENCOUNTER — Other Ambulatory Visit: Payer: Self-pay | Admitting: Family Medicine

## 2022-06-14 ENCOUNTER — Other Ambulatory Visit: Payer: Self-pay

## 2022-06-14 MED ORDER — FAMOTIDINE 20 MG PO TABS: 20.0000 mg | ORAL_TABLET | Freq: Two times a day (BID) | ORAL | 1 refills | Status: DC

## 2022-06-30 ENCOUNTER — Telehealth: Payer: Self-pay | Admitting: Family Medicine

## 2022-06-30 NOTE — Telephone Encounter (Signed)
Patient called to advise that he is still having a lot of pain in his right buttock. He had decreased to 2 acetaminophen and 1 ibuprofen every 6 hours but wants to know if Dr. Charlett Blake recommends him going back to 2 acetaminophen and 2 ibuprofen every 6 hours or should he take aleve to help alleviate his pain. Please call patient to advise.

## 2022-07-01 ENCOUNTER — Other Ambulatory Visit: Payer: Self-pay | Admitting: Family Medicine

## 2022-07-01 NOTE — Telephone Encounter (Signed)
Pt notified of provider instructions and will continue with 2 acetaminophen and 2 ibuprofen q6h.

## 2022-07-01 NOTE — Telephone Encounter (Signed)
LMOM for pt to return call. 

## 2022-07-15 ENCOUNTER — Ambulatory Visit: Payer: Medicare Other

## 2022-07-18 ENCOUNTER — Ambulatory Visit (INDEPENDENT_AMBULATORY_CARE_PROVIDER_SITE_OTHER): Payer: Medicare Other | Admitting: Family Medicine

## 2022-07-18 ENCOUNTER — Encounter: Payer: Self-pay | Admitting: Family Medicine

## 2022-07-18 VITALS — BP 141/76 | HR 67 | Temp 97.7°F | Resp 18 | Ht 72.0 in | Wt 145.5 lb

## 2022-07-18 DIAGNOSIS — I10 Essential (primary) hypertension: Secondary | ICD-10-CM | POA: Diagnosis not present

## 2022-07-18 DIAGNOSIS — E7800 Pure hypercholesterolemia, unspecified: Secondary | ICD-10-CM

## 2022-07-18 DIAGNOSIS — R739 Hyperglycemia, unspecified: Secondary | ICD-10-CM | POA: Diagnosis not present

## 2022-07-18 DIAGNOSIS — N189 Chronic kidney disease, unspecified: Secondary | ICD-10-CM

## 2022-07-18 DIAGNOSIS — M109 Gout, unspecified: Secondary | ICD-10-CM

## 2022-07-18 DIAGNOSIS — E538 Deficiency of other specified B group vitamins: Secondary | ICD-10-CM | POA: Diagnosis not present

## 2022-07-18 DIAGNOSIS — I255 Ischemic cardiomyopathy: Secondary | ICD-10-CM

## 2022-07-18 MED ORDER — CYANOCOBALAMIN 1000 MCG/ML IJ SOLN
1000.0000 ug | Freq: Once | INTRAMUSCULAR | Status: AC
  Administered 2022-07-18: 1000 ug via INTRAMUSCULAR

## 2022-07-18 NOTE — Assessment & Plan Note (Signed)
Hydrate and monitor 

## 2022-07-18 NOTE — Assessment & Plan Note (Signed)
hgba1c acceptable, minimize simple carbs. Increase exercise as tolerated.  

## 2022-07-18 NOTE — Assessment & Plan Note (Signed)
Well controlled, no changes to meds. Encouraged heart healthy diet such as the DASH diet and exercise as tolerated.  °

## 2022-07-18 NOTE — Assessment & Plan Note (Signed)
Tolerating statin, encouraged heart healthy diet, avoid trans fats, minimize simple carbs and saturated fats. Increase exercise as tolerated 

## 2022-07-18 NOTE — Progress Notes (Signed)
Subjective:    Patient ID: Daniel Reeves, male    DOB: January 23, 1924, 88 y.o.   MRN: [id number]  Chief Complaint  Patient presents with   Follow-up    B12 injection today    HPI Patient is in today for follow up on chronic medical concerns. No recent febrile illness or acute hospitalizations. He continues to keep up his house so it will be easy to sell it when he no longer wants it. For now he is managing his adls well with the help of family. Denies CP/palp/SOB/HA/congestion/fevers/GI or GU c/o. Taking meds as prescribed. He has been diagnosed with skin cancer on his nose but he and dermatology have chosen to just monitor  Past Medical History:  Diagnosis Date   Abdominal aortic aneurysm ([place])    a. 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 s/p CABG in 1979 and 1993;  b. LHC (5/14):  LM, LAD, CFX and RCA occluded; L-LAD ok, dLAD occluded after insertion of LIMA, S-OM occluded, S-PDA/AM 80-90 => PCI with Promus DES; EF 25%   Cardiomyopathy, ischemic 06/05/2013   Cerumen impaction    Bilateral   Chicken pox as a child   Chronic systolic CHF (congestive heart failure) ([place])    COLONIC POLYPS 07/01/2008   Qualifier: Diagnosis of  By: Lenna Gilford MD, Scott M    Degenerative joint disease    DEGENERATIVE JOINT DISEASE 11/16/2007   Qualifier: Diagnosis of  By: Julien Girt CMA, Leigh      Diverticulosis of colon    DIVERTICULOSIS OF COLON 07/01/2008   Qualifier: Diagnosis of  By: Lenna Gilford MD, Deborra Medina    Double vision 03/07/2017   Essential hypertension 11/16/2007   Qualifier: Diagnosis of  By: Julien Girt CMA, Leigh     Fingernail abnormalities 03/07/2017   FLANK PAIN, RIGHT 02/03/2010   Qualifier: History of  By: Lenna Gilford MD, Deborra Medina    GERD (gastroesophageal reflux disease)    GOUT 11/16/2007   Qualifier: Diagnosis of  By: Julien Girt CMA, Leigh     Hearing loss 11/24/2014   Heart murmur    History of shingles 10/27/2017   Hypercholesterolemia    HYPERCHOLESTEROLEMIA 11/16/2007   Qualifier: Diagnosis of  By: Julien Girt CMA, Leigh     Ischemic cardiomyopathy    a. echo (09/05/13): EF 35%, diffuse HK worsened distal septal, mid/distal inferior and apical region, grade 1 diastolic dysfunction, mild LAE.     Kidney stone 08/17/2011   Loss of hearing    Lumbar back pain    Measles as a child   Medicare annual wellness visit, subsequent 11/24/2014   Sees Dr Delman Cheadle for dermatology Sees Dr Roni Bread of Urology Sees Dr Stanford Breed of cardiology Sees Dr Virginia Rochester of Opthamology No further colonoscopies warranted  Mumps as a child   Nephrolithiasis    Pain in joint, lower leg 07/29/2014   PERIPHERAL VASCULAR DISEASE 11/16/2007   Qualifier: Diagnosis of  By: Julien Girt CMA, Leigh     Peripheral vascular disease ([place])    Rectal bleeding 03/07/2017   Shingles 07/29/2014   Sun-damaged skin 05/31/2014    Past Surgical History:  Procedure Laterality Date   CORONARY ANGIOPLASTY WITH STENT PLACEMENT  04/18/2013   RCA        CORONARY ARTERY BYPASS GRAFT  1979   x4 SVG-DIAG-LAD, SVG-OM-PDA   CORONARY ARTERY BYPASS GRAFT  1993   Redo x5 by Dr Harlow Asa; Durward Fortes, SVG-OM, SVG-AM-PL   Decompressive laminectomy  01/2006   L2 - scarum by Dr. Shellia Carwin   HEMORRHOID SURGERY     fissure with hemorrhoid corrected at age 11   Scott   Right by Dr Lonzo Cloud HERNIA REPAIR  1996   Left by Dr.  Harlow Asa   LEFT HEART CATHETERIZATION WITH CORONARY ANGIOGRAM N/A 09/23/2013   Procedure: LEFT HEART CATHETERIZATION WITH CORONARY ANGIOGRAM;  Surgeon: Blane Ohara, MD;  Location: [facility] CATH LAB;  Service: Cardiovascular;  Laterality: N/A;   lens implants     for vision correction   PERCUTANEOUS CORONARY STENT INTERVENTION (PCI-S) N/A 04/18/2013   Procedure: PERCUTANEOUS CORONARY STENT INTERVENTION (PCI-S);  Surgeon: Sherren Mocha, MD;  Location[facility] C[facility] LAB;  Service: Cardiovascular;  Laterality: N/A;   TONSILLECTOMY      Family History  Problem Relation Age of Onset   Parkinsonism Brother    Diabetes Maternal Grandmother    Depression Daughter    Other Son        4 stents   Heart disease Son    Diabetes Son        type 2   Colon cancer Neg Hx    Esophageal cancer Neg Hx    Rectal cancer Neg Hx    Stomach cancer Neg Hx     Social History   Socioeconomic History   Marital status: Widowed    Spouse name: [name] x 72 years   Number of children: 7   Years of education: Not on file   Highest education level: Not on file  Occupational History   Occupation: Retired - Former Editor, 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   Other Topics Concern   Not on file  Social History Narrative   Married   7 children   Social Determinants of Health   Financial Resource Strain: Low Risk  (12/03/2021)   Overall Financial Resource Strain (CARDIA)    Difficulty of Paying Living Expenses: Not hard at all  Food Insecurity: No Food Insecurity  (12/03/2021)   Hunger Vital Sign    Worried About Running Out of Food in the Last Year: Never true    Ran Out of Food in the Last Year: Never true  Transportation Needs: No Transportation Needs (  12/03/2021)   PRAPARE - Hydrologist (Medical): No    Lack of Transportation (Non-Medical): No  Physical Activity: Inactive (12/03/2021)   Exercise Vital Sign    Days of Exercise per Week: 0 days    Minutes of Exercise per Session: 0 min  Stress: No Stress Concern Present (12/03/2021)   [place]    Feeling of Stress : Not at all  Social Connections: Moderately Isolated (12/03/2021)   Social Connection and Isolation Panel [NHANES]    Frequency of Communication with Friends and Family: More than three times a week    Frequency of Social Gatherings with Friends and Family: More than three times a week    Attends Religious Services: 1 to 4 times per year    Active Member of [employer] or Organizations: No    Attends Archivist Meetings: Never    Marital Status: Widowed  Intimate Partner Violence: Not At Risk (12/03/2021)   Humiliation, Afraid, Rape, and Kick questionnaire    Fear of Current or Ex-Partner: No    Emotionally Abused: No    Physically Abused: No    Sexually Abused: No    Outpatient Medications Prior to Visit  Medication Sig Dispense Refill   allopurinol (ZYLOPRIM) 300 MG tablet Take 1 tablet by mouth once daily 90 tablet 0   aspirin EC 81 MG tablet Take 81 mg by mouth every morning.      b complex vitamins capsule Take 1 capsule by mouth daily.     busPIRone (BUSPAR) 5 MG tablet Take 1 tablet by mouth twice daily as needed 60 tablet 0   Cholecalciferol (VITAMIN D-3 PO) Take 5,000 Units by mouth daily with breakfast.     famotidine (PEPCID) 20 MG tablet Take 1 tablet (20 mg total) by mouth 2 (two) times daily. 180 tablet 1   finasteride (PROSCAR) 5 MG tablet Takes every third day     folic  acid (FOLVITE) 001 MCG tablet Take 400 mcg by mouth 2 (two) times daily.     isosorbide mononitrate (IMDUR) 30 MG 24 hr tablet Take 3 tablets by mouth once daily 270 tablet 0   losartan (COZAAR) 50 MG tablet Take 1 tablet by mouth once daily 90 tablet 3   metoprolol succinate (TOPROL-XL) 25 MG 24 hr tablet Take 1/2 (one-half) tablet by mouth once daily 45 tablet 3   Misc Natural Products (OSTEO BI-FLEX ADV JOINT SHIELD) TABS Take 1 tablet by mouth 2 (two) times daily.     Multiple Vitamin (MULTIVITAMIN) tablet Take 1 tablet by mouth daily.     nitroGLYCERIN (NITROSTAT) 0.4 MG SL tablet DISSOLVE ONE TABLET UNDER THE TONGUE EVERY 5 MINUTES AS NEEDED FOR CHEST PAIN.  DO NOT EXCEED A TOTAL OF 3 DOSES IN 15 MINUTES 25 tablet 2   simvastatin (ZOCOR) 40 MG tablet TAKE 1 TABLET BY MOUTH ONCE DAILY IN THE EVENING 90 tablet 1   Cholecalciferol (VITAMIN D3) 1.25 MG (50000 UT) CAPS Take by mouth.     cyanocobalamin (,VITAMIN B-12,) 1000 MCG/ML injection Inject 1,000 mcg into the muscle every 30 (thirty) days. (Patient not taking: Reported on 04/25/2022)     cyanocobalamin ((VITAMIN B-12)) injection 1,000 mcg      cyanocobalamin ((VITAMIN B-12)) injection 1,000 mcg      No facility-administered medications prior to visit.    Allergies  Allergen Reactions   Baclofen Other (See Comments)    Altered mental status  Celecoxib Other (See Comments)    Altered mental status   Lisinopril     REACTION: dizziness   Methocarbamol     REACTION: pt states "dizzy"   Other    Pregabalin     REACTION: pt states "dizzy"   Ramipril     REACTION: hives and dizziness   Tramadol Other (See Comments)    Altered mental status    Review of Systems  Constitutional:  Positive for malaise/fatigue. Negative for fever.  HENT:  Negative for congestion.   Eyes:  Negative for blurred vision.  Respiratory:  Negative for shortness of breath.   Cardiovascular:  Negative for chest pain, palpitations and leg swelling.   Gastrointestinal:  Negative for abdominal pain, blood in stool and nausea.  Genitourinary:  Negative for dysuria and frequency.  Musculoskeletal:  Negative for falls.  Skin:  Positive for rash.  Neurological:  Negative for dizziness, loss of consciousness and headaches.  Endo/Heme/Allergies:  Negative for environmental allergies.  Psychiatric/Behavioral:  Negative for depression. The patient is not nervous/anxious.        Objective:    Physical Exam Constitutional:      General: He is not in acute distress.    Appearance: Normal appearance. He is normal weight. He is not ill-appearing or toxic-appearing.  HENT:     Head: Normocephalic and atraumatic.     Right Ear: Tympanic membrane and external ear normal.     Left Ear: Tympanic membrane and external ear normal.     Nose: Nose normal.  Eyes:     General:        Right eye: No discharge.        Left eye: No discharge.  Cardiovascular:     Heart sounds: No murmur heard. Pulmonary:     Effort: Pulmonary effort is normal.  Abdominal:     General: There is distension.     Palpations: Abdomen is soft. There is no mass.     Tenderness: There is no abdominal tenderness.  Skin:    Findings: No rash.     Comments: Scab on right side of nose  Neurological:     Mental Status: He is alert and oriented to person, place, and time.  Psychiatric:        Behavior: Behavior normal.     BP (!) 141/76   Pulse 67   Temp 97.7 F (36.5 C) (Oral)   Resp 18   Ht 6' (1.829 m)   Wt 145 lb 8 oz (66 kg)   SpO2 99%   BMI 19.73 kg/m  Wt Readings from Last 3 Encounters:  07/18/22 145 lb 8 oz (66 kg)  03/04/22 146 lb (66.2 kg)  02/16/22 152 lb 1.3 oz (69 kg)    Diabetic Foot Exam - Simple   No data filed    Lab Results  Component Value Date   WBC 7.1 05/30/2022   HGB 11.4 (L) 05/30/2022   HCT 34.1 (L) 05/30/2022   PLT 197.0 05/30/2022   GLUCOSE 92 05/30/2022   CHOL 99 01/04/2022   TRIG 97.0 01/04/2022   HDL 41.50 01/04/2022    LDLCALC 38 01/04/2022   ALT 12 05/30/2022   AST 19 05/30/2022   NA 137 05/30/2022   K 4.3 05/30/2022   CL 103 05/30/2022   CREATININE 1.09 05/30/2022   BUN 22 05/30/2022   CO2 24 05/30/2022   TSH 1.67 01/04/2022   PSA 25.40 12/08/2021   INR 1.09 02/04/2018   HGBA1C 5.8 01/04/2022  Lab Results  Component Value Date   TSH 1.67 01/04/2022   Lab Results  Component Value Date   WBC 7.1 05/30/2022   HGB 11.4 (L) 05/30/2022   HCT 34.1 (L) 05/30/2022   MCV 99.3 05/30/2022   PLT 197.0 05/30/2022   Lab Results  Component Value Date   NA 137 05/30/2022   K 4.3 05/30/2022   CO2 24 05/30/2022   GLUCOSE 92 05/30/2022   BUN 22 05/30/2022   CREATININE 1.09 05/30/2022   BILITOT 1.0 05/30/2022   ALKPHOS 89 05/30/2022   AST 19 05/30/2022   ALT 12 05/30/2022   PROT 6.1 05/30/2022   ALBUMIN 4.0 05/30/2022   CALCIUM 9.4 05/30/2022   ANIONGAP 7 11/30/2019   GFR 57.43 (L) 05/30/2022   Lab Results  Component Value Date   CHOL 99 01/04/2022   Lab Results  Component Value Date   HDL 41.50 01/04/2022   Lab Results  Component Value Date   LDLCALC 38 01/04/2022   Lab Results  Component Value Date   TRIG 97.0 01/04/2022   Lab Results  Component Value Date   CHOLHDL 2 01/04/2022   Lab Results  Component Value Date   HGBA1C 5.8 01/04/2022       Assessment & Plan:   Problem List Items Addressed This Visit     HYPERCHOLESTEROLEMIA    Tolerating statin, encouraged heart healthy diet, avoid trans fats, minimize simple carbs and saturated fats. Increase exercise as tolerated      Relevant Orders   Lipid panel   Gout    Hydrate and monitor      Essential hypertension    Well controlled, no changes to meds. Encouraged heart healthy diet such as the DASH diet and exercise as tolerated.       Relevant Orders   CBC   Comprehensive metabolic panel   TSH   Hyperglycemia    hgba1c acceptable, minimize simple carbs. Increase exercise as tolerated.       Relevant  Orders   Hemoglobin A1c   Chronic renal disease    Hydrate and monitor      Other Visit Diagnoses     Vitamin B12 deficiency    -  Primary   Relevant Medications   cyanocobalamin (VITAMIN B12) injection 1,000 mcg (Completed)   Other Relevant Orders   Vitamin B12       I have discontinued Maudie Flakes. [place] "Dick"'s cyanocobalamin and Vitamin D3. I am also having him maintain his folic acid, Osteo Bi-Flex Adv Joint Shield, Cholecalciferol (VITAMIN D-3 PO), aspirin EC, finasteride, nitroGLYCERIN, multivitamin, metoprolol succinate, b complex vitamins, losartan, simvastatin, isosorbide mononitrate, allopurinol, famotidine, and busPIRone. We will stop administering cyanocobalamin and cyanocobalamin. Additionally, we administered cyanocobalamin.  Meds ordered this encounter  Medications   cyanocobalamin (VITAMIN B12) injection 1,000 mcg     Penni Homans, MD

## 2022-07-19 DIAGNOSIS — M8008XD Age-related osteoporosis with current pathological fracture, vertebra(e), subsequent encounter for fracture with routine healing: Secondary | ICD-10-CM | POA: Diagnosis not present

## 2022-07-27 NOTE — Progress Notes (Signed)
HPI: FU CAD; s/p CABG in 1979 and 1993, ischemic CM, systolic CHF, AAA, HTN, HL. Patient underwent cardiac catheterization in May of 2014. The left main, LAD, circumflex and RCA were occluded. The LIMA to the LAD was patent and the distal LAD was occluded after the insertion. Saphenous vein graft to the obtuse marginal was occluded. Saphenous vein graft to the acute marginal and PDA had a high-grade lesion prior to insertion into the PDA of 80-90%. Ejection fraction was 25%. PCI: Promus Premier (3.5x12 mm) DES to the [facility]. Repeat catheterization in October 2014 because of recurrent chest pain. The stent placed in the saphenous vein graft to the PDA had mild in-stent restenosis. Medical therapy recommended. Nuclear study 2/17 showed EF 35, inferolateral scar, no ischemia. Carotid Dopplers February 2018 showed less than 50% bilateral stenosis. Most recent echocardiogram April 2022 showed ejection fraction 25 to 30%, trace aortic insufficiency.  Abdominal ultrasound September 2022 showed 4 cm abdominal aortic aneurysm.  Since he was last seen, patient denies dyspnea, exertional chest pain, palpitations.  Occasional brief 1 to 2-second pain in various locations on his chest.  He did fall in May outside with a question of syncope.  Current Outpatient Medications  Medication Sig Dispense Refill   allopurinol (ZYLOPRIM) 300 MG tablet Take 1 tablet by mouth once daily 90 tablet 0   aspirin EC 81 MG tablet Take 81 mg by mouth every morning.      b complex vitamins capsule Take 1 capsule by mouth daily.     busPIRone (BUSPAR) 5 MG tablet Take 1 tablet by mouth twice daily as needed 60 tablet 0   Cholecalciferol (VITAMIN D-3 PO) Take 5,000 Units by mouth daily with breakfast.     famotidine (PEPCID) 20 MG tablet Take 1 tablet (20 mg total) by mouth 2 (two) times daily. 180 tablet 1   finasteride (PROSCAR) 5 MG tablet Takes every third day     folic acid (FOLVITE) 774 MCG tablet Take 400 mcg by mouth 2 (two)  times daily.     isosorbide mononitrate (IMDUR) 30 MG 24 hr tablet Take 3 tablets by mouth once daily 270 tablet 0   losartan (COZAAR) 50 MG tablet Take 1 tablet by mouth once daily 90 tablet 3   metoprolol succinate (TOPROL-XL) 25 MG 24 hr tablet Take 1/2 (one-half) tablet by mouth once daily 45 tablet 3   Misc Natural Products (OSTEO BI-FLEX ADV JOINT SHIELD) TABS Take 1 tablet by mouth 2 (two) times daily.     Multiple Vitamin (MULTIVITAMIN) tablet Take 1 tablet by mouth daily.     nitroGLYCERIN (NITROSTAT) 0.4 MG SL tablet DISSOLVE ONE TABLET UNDER THE TONGUE EVERY 5 MINUTES AS NEEDED FOR CHEST PAIN.  DO NOT EXCEED A TOTAL OF 3 DOSES IN 15 MINUTES 25 tablet 2   simvastatin (ZOCOR) 40 MG tablet TAKE 1 TABLET BY MOUTH ONCE DAILY IN THE EVENING 90 tablet 1   No current facility-administered medications for this visit.     Past Medical History:  Diagnosis Date   Abdominal aortic aneurysm ([place])    a. [place] (1/14):  3.3 x 3.4 cm => f/u 11/2013   Amebic dysentery    AMEBIC DYSENTERY 11/19/2007   Qualifier: History of  By: Lenna Gilford MD, Deborra Medina    Anemia 03/07/2017   ANXIETY 11/19/2007   Qualifier: Diagnosis of  By: Lenna Gilford MD, Deborra Medina    Arthritis 03/07/2017   Atherosclerosis of coronary artery bypass graft with unstable angina  pectoris ([place]) 04/19/2013   BACK PAIN, LUMBAR 11/16/2007   Qualifier: Diagnosis of  By: Julien Girt CMA, Leigh     Benign prostatic hypertrophy    BENIGN PROSTATIC HYPERTROPHY, HX OF 11/16/2007   Qualifier: Diagnosis of  By: Julien Girt CMA, Leigh     BRBPR (bright red blood per rectum) 11/01/2016   CAD (coronary artery disease)    a. s/p CABG in 1979 and 1993;  b. LHC (5/14):  LM, LAD, CFX and RCA occluded; L-LAD ok, dLAD occluded after insertion of LIMA, S-OM occluded, S-PDA/AM 80-90 => PCI with Promus DES; EF 25%   Cardiomyopathy, ischemic 06/05/2013   Cerumen impaction    Bilateral   Chicken pox as a child   Chronic systolic CHF (congestive heart failure) ([place])    COLONIC POLYPS  07/01/2008   Qualifier: Diagnosis of  By: Lenna Gilford MD, Scott M    Degenerative joint disease    DEGENERATIVE JOINT DISEASE 11/16/2007   Qualifier: Diagnosis of  By: Julien Girt CMA, Leigh     Diverticulosis of colon    DIVERTICULOSIS OF COLON 07/01/2008   Qualifier: Diagnosis of  By: Lenna Gilford MD, Deborra Medina    Double vision 03/07/2017   Essential hypertension 11/16/2007   Qualifier: Diagnosis of  By: Julien Girt CMA, Leigh     Fingernail abnormalities 03/07/2017   FLANK PAIN, RIGHT 02/03/2010   Qualifier: History of  By: Lenna Gilford MD, Deborra Medina    GERD (gastroesophageal reflux disease)    GOUT 11/16/2007   Qualifier: Diagnosis of  By: Julien Girt CMA, Leigh     Hearing loss 11/24/2014   Heart murmur    History of shingles 10/27/2017   Hypercholesterolemia    HYPERCHOLESTEROLEMIA 11/16/2007   Qualifier: Diagnosis of  By: Julien Girt CMA, Leigh     Ischemic cardiomyopathy    a. echo (09/05/13): EF 35%, diffuse HK worsened distal septal, mid/distal inferior and apical region, grade 1 diastolic dysfunction, mild LAE.     Kidney stone 08/17/2011   Loss of hearing    Lumbar back pain    Measles as a child   Medicare annual wellness visit, subsequent 11/24/2014   Sees Dr Delman Cheadle for dermatology Sees Dr Roni Bread of Urology Sees Dr Stanford Breed of cardiology Sees Dr Virginia Rochester of Opthamology No further colonoscopies warranted        Mumps as a child   Nephrolithiasis    Pain in joint, lower leg 07/29/2014   PERIPHERAL VASCULAR DISEASE 11/16/2007   Qualifier: Diagnosis of  By: Julien Girt CMA, Leigh     Peripheral vascular disease ([place])    Rectal bleeding 03/07/2017   Shingles 07/29/2014   Sun-damaged skin 05/31/2014    Past Surgical History:  Procedure Laterality Date   CORONARY ANGIOPLASTY WITH STENT PLACEMENT  04/18/2013   RCA        CORONARY ARTERY BYPASS GRAFT  1979   x4 SVG-DIAG-LAD, SVG-OM-PDA   CORONARY ARTERY BYPASS GRAFT  1993   Redo x5 by Dr Harlow Asa; Durward Fortes, SVG-OM, SVG-AM-PL   Decompressive laminectomy  01/2006   L2 - scarum by  Dr. Shellia Carwin   HEMORRHOID SURGERY     fissure with hemorrhoid corrected at age 36   [place]   Right by Dr Lonzo Cloud HERNIA REPAIR  1996   Left by Dr. Harlow Asa   LEFT HEART CATHETERIZATION WITH CORONARY ANGIOGRAM N/A 09/23/2013   Procedure: [place];  Surgeon: Blane Ohara, MD;  Location: [facility] CATH LAB;  Service: Cardiovascular;  Laterality:  N/A;   lens implants     for vision correction   PERCUTANEOUS CORONARY STENT INTERVENTION (PCI-S) N/A 04/18/2013   Procedure: PERCUTANEOUS CORONARY STENT INTERVENTION (PCI-S);  Surgeon: Sherren Mocha, MD;  Location: [facility] CATH LAB;  Service: Cardiovascular;  Laterality: N/A;   TONSILLECTOMY      Social History   Socioeconomic History   Marital status: Widowed    Spouse name: [name] x 72 years   Number of children: 7   Years of education: Not on file   Highest education level: Not on file  Occupational History   Occupation: Retired - Former Editor, 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   Other Topics Concern   Not on file  Social History Narrative   Married   7 children   Social Determinants of Health   Financial Resource Strain: Low Risk  (12/03/2021)   Overall Financial Resource Strain (CARDIA)    Difficulty of Paying Living Expenses: Not hard at all  Food Insecurity: No Food Insecurity (12/03/2021)   Hunger Vital Sign    Worried About Running Out of Food in the Last Year: Never true    Ran Out of Food in the Last Year: Never  true  Transportation Needs: No Transportation Needs (12/03/2021)   PRAPARE - Hydrologist (Medical): No    Lack of Transportation (Non-Medical): No  Physical Activity: Inactive (12/03/2021)   Exercise Vital Sign    Days of Exercise per Week: 0 days    Minutes of Exercise per Session: 0 min  Stress: No Stress Concern Present (12/03/2021)   [place]    Feeling of Stress : Not at all  Social Connections: Moderately Isolated (12/03/2021)   Social Connection and Isolation Panel [NHANES]    Frequency of Communication with Friends and Family: More than three times a week    Frequency of Social Gatherings with Friends and Family: More than three times a week    Attends Religious Services: 1 to 4 times per year    Active Member of [employer] or Organizations: No    Attends Archivist Meetings: Never    Marital Status: Widowed  Intimate Partner Violence: Not At Risk (12/03/2021)   Humiliation, Afraid, Rape, and Kick questionnaire    Fear of Current or Ex-Partner: No    Emotionally Abused: No    Physically Abused: No    Sexually Abused: No    Family History  Problem Relation Age of Onset   Parkinsonism Brother    Diabetes Maternal Grandmother    Depression Daughter    Other Son        4 stents   Heart disease Son    Diabetes Son        type 2   Colon cancer Neg Hx    Esophageal cancer Neg Hx    Rectal cancer Neg Hx    Stomach cancer  Neg Hx     ROS: Back pain but no fevers or chills, productive cough, hemoptysis, dysphasia, odynophagia, melena, hematochezia, dysuria, hematuria, rash, seizure activity, orthopnea, PND, claudication. Remaining systems are negative.  Physical Exam: Well-developed well-nourished in no acute distress.  Skin is warm and dry.  HEENT is normal.  Neck is supple.  Chest is clear to auscultation with normal expansion.  Cardiovascular exam is regular rate and rhythm.   Abdominal exam nontender or distended. No masses palpated. Extremities show trace edema. neuro grossly intact  ECG-sinus bradycardia at a rate of 51, left bundle branch block.  Personally reviewed  A/P  1 coronary artery disease-patient denies exertional chest pain.  Continue aspirin and statin.  2 ischemic cardiomyopathy-continue ARB/beta-blocker.  Given his age I have been conservative with medication adjustments as he has done well for quite some time.  We will therefore not add SGLT2 inhibitor or spironolactone.  3 hypertension-patient's blood pressure is controlled.  Continue present medical regimen.  4 hyperlipidemia-continue statin.  5 history of palpitations-no recent symptoms.  6 history of abdominal aortic aneurysm-we discussed at previous office visit that he is 88 years old and he stated he would not want major procedures even if aneurysm increased in size.  We have therefore elected not to pursue further imaging as it would not change our management.  Kirk Ruths, MD

## 2022-07-28 ENCOUNTER — Telehealth: Payer: Self-pay | Admitting: Family Medicine

## 2022-07-28 NOTE — Telephone Encounter (Signed)
Pt stated he is having the worst back pain he has ever experienced and is very concerned. He stated he had no one to take him to the ER and does not know what to do. Went ahead and put him on with Dr.Lowne tomorrow as pcp was not available. Advised pt I would send a message to pcp so they are aware and offered to transfer to triage as he stated this is very severe. He stated he wanted as many people to be involved as possible, and transferred pt.

## 2022-07-29 ENCOUNTER — Ambulatory Visit (HOSPITAL_BASED_OUTPATIENT_CLINIC_OR_DEPARTMENT_OTHER)
Admission: RE | Admit: 2022-07-29 | Discharge: 2022-07-29 | Disposition: A | Discharge status: Home / Self Care | Payer: Medicare Other | Source: Ambulatory Visit | Attending: Family Medicine | Admitting: Family Medicine

## 2022-07-29 ENCOUNTER — Encounter: Payer: Self-pay | Admitting: Family Medicine

## 2022-07-29 ENCOUNTER — Ambulatory Visit (INDEPENDENT_AMBULATORY_CARE_PROVIDER_SITE_OTHER): Payer: Medicare Other | Admitting: Family Medicine

## 2022-07-29 VITALS — BP 110/70 | HR 60 | Temp 98.2°F | Resp 16 | Ht 72.0 in | Wt 148.8 lb

## 2022-07-29 DIAGNOSIS — M545 Low back pain, unspecified: Secondary | ICD-10-CM | POA: Diagnosis not present

## 2022-07-29 DIAGNOSIS — M25551 Pain in right hip: Secondary | ICD-10-CM | POA: Diagnosis not present

## 2022-07-29 DIAGNOSIS — M5441 Lumbago with sciatica, right side: Secondary | ICD-10-CM | POA: Diagnosis not present

## 2022-07-29 DIAGNOSIS — I255 Ischemic cardiomyopathy: Secondary | ICD-10-CM | POA: Diagnosis not present

## 2022-07-29 DIAGNOSIS — M549 Dorsalgia, unspecified: Secondary | ICD-10-CM | POA: Diagnosis not present

## 2022-07-29 DIAGNOSIS — M8588 Other specified disorders of bone density and structure, other site: Secondary | ICD-10-CM | POA: Diagnosis not present

## 2022-07-29 NOTE — Telephone Encounter (Signed)
Nurse Assessment Nurse: Joya Gaskins, RN, Vonna Kotyk Date/Time Eilene Ghazi Time): 07/28/2022 4:44:46 PM Confirm and document reason for call. If symptomatic, describe symptoms. ---Caller states that he woke up from a nap this afternoon, and he sat up and started to walk to bathroom, and while having a BM, he had severe pain in his back, and movement makes it return. Hx of spinal dislocation. Does the patient have any new or worsening symptoms? ---Yes Will a triage be completed? ---Yes Related visit to physician within the last 2 weeks? ---Yes Does the PT have any chronic conditions? (i.e. diabetes, asthma, this includes High risk factors for pregnancy, etc.) ---Yes List chronic conditions. ---spinal dislocation hx, Is this a behavioral health or substance abuse call? ---No Guidelines Guideline Title Affirmed Question Affirmed Notes Nurse Date/Time (Eastern Time) Back Pain [1] SEVERE back pain (e.g., excruciating) AND [2] sudden onset AND [3] age [name] 07/28/2022 4:48:53 PM PLEASE NOTE: All timestamps contained within this report are represented as [place] Standard Time. CONFIDENTIALTY NOTICE: This fax transmission is intended only for the addressee. It contains information that is legally privileged, confidential or otherwise protected from use or disclosure. If you are not the intended recipient, you are strictly prohibited from reviewing, disclosing, copying using or disseminating any of this information or taking any action in reliance on or regarding this information. If you have received this fax in error, please notify us immediately by telephone so that we can arrange for its return to [place]. Phone: [phone], Toll-Free: [phone], Fax: [phone] Page: 2 of 2 Call Id: [id number] [place]. Time Eilene Ghazi Time) Disposition Final User 07/28/2022 4:51:22 PM Go to ED Now Yes Joya Gaskins, RN, Vonna Kotyk Final Disposition 07/28/2022 4:51:22 PM Go to ED Now Yes Joya Gaskins, RN,  Aviva Kluver Disagree/Comply Comply Caller Understands Yes PreDisposition Did not know what to do Care Advice Given Per Guideline GO TO ED NOW: CARE ADVICE given per Back Pain (Adult) guideline. Referrals [facility] - ED

## 2022-07-29 NOTE — Progress Notes (Signed)
Established Patient Office Visit  Subjective   Patient ID: Daniel Reeves, male    DOB: [birth year]  Age: 88 y.o. MRN: [id number]  Chief Complaint  Patient presents with   Back Pain    Pt states he rehurt his back yesterday. Pt states he rushing from bed to the bathroom and had a accident on himself and twisted the wrong way and irritated an old injury.     HPI Pt is here c/o back pain  He take 2 asap and 2 iB daily but pain is severe    Review of Systems  Constitutional:  Negative for fever and malaise/fatigue.  HENT:  Negative for congestion.   Eyes:  Negative for blurred vision.  Respiratory:  Negative for shortness of breath.   Cardiovascular:  Negative for chest pain, palpitations and leg swelling.  Gastrointestinal:  Negative for abdominal pain, blood in stool and nausea.  Genitourinary:  Negative for dysuria and frequency.  Musculoskeletal:  Positive for back pain. Negative for falls.  Skin:  Negative for rash.  Neurological:  Negative for dizziness, loss of consciousness and headaches.  Endo/Heme/Allergies:  Negative for environmental allergies.  Psychiatric/Behavioral:  Negative for depression. The patient is not nervous/anxious.       Objective:     BP 110/70 (BP Location: Left Arm, Patient Position: Sitting, Cuff Size: Normal)   Pulse 60   Temp 98.2 F (36.8 C) (Oral)   Resp 16   Ht 6' (1.829 m)   Wt 148 lb 12.8 oz (67.5 kg)   SpO2 97%   BMI 20.18 kg/m    Physical Exam Vitals and nursing note reviewed.  Constitutional:      Appearance: He is well-developed.  HENT:     Head: Normocephalic and atraumatic.  Eyes:     Pupils: Pupils are equal, round, and reactive to light.  Neck:     Thyroid: No thyromegaly.  Cardiovascular:     Rate and Rhythm: Normal rate and regular rhythm.     Heart sounds: No murmur heard. Pulmonary:     Effort: Pulmonary effort is normal. No respiratory distress.     Breath sounds: Normal breath sounds. No wheezing or  rales.  Chest:     Chest wall: No tenderness.  Musculoskeletal:        General: Tenderness and signs of injury present.     Cervical back: Normal range of motion and neck supple.     Right hip: Tenderness present. Normal range of motion. Normal strength.     Left hip: Tenderness present. Normal range of motion. Normal strength.     Right foot: Bony tenderness present. No swelling.     Left foot: Bony tenderness present. No swelling.     Comments: Pt is wearing back brace   Skin:    General: Skin is warm and dry.  Neurological:     Mental Status: He is alert and oriented to person, place, and time.  Psychiatric:        Behavior: Behavior normal.        Thought Content: Thought content normal.        Judgment: Judgment normal.      No results found for any visits on 07/29/22.    The ASCVD Risk score (Arnett DK, et al., 2019) failed to calculate for the following reasons:   The 2019 ASCVD risk score is only valid for ages 40 to 39    Assessment & Plan:   Problem List Items Addressed This  Visit       Unprioritized   Pain of right hip - Primary    con't tylenol  con't patches Pt does not want pain meds Xray today F/u ortho if pain worsens / con't      Relevant Orders   DG Hip Unilat W OR W/O Pelvis 2-3 Views Right (Completed)   DG Lumbar Spine Complete (Completed)   BACK PAIN, LUMBAR    Recheck xrays  F/u ortho or go to er if pain worsens  Pt does not want pain meds       Relevant Orders   DG Hip Unilat W OR W/O Pelvis 2-3 Views Right (Completed)   DG Lumbar Spine Complete (Completed)    No follow-ups on file.    Ann Held, DO

## 2022-07-29 NOTE — Assessment & Plan Note (Signed)
Recheck xrays  F/u ortho or go to er if pain worsens  Pt does not want pain meds

## 2022-07-29 NOTE — Assessment & Plan Note (Signed)
con't tylenol  con't patches Pt does not want pain meds Xray today F/u ortho if pain worsens / con't

## 2022-08-03 ENCOUNTER — Other Ambulatory Visit (INDEPENDENT_AMBULATORY_CARE_PROVIDER_SITE_OTHER): Payer: Medicare Other

## 2022-08-03 DIAGNOSIS — R739 Hyperglycemia, unspecified: Secondary | ICD-10-CM

## 2022-08-03 DIAGNOSIS — I10 Essential (primary) hypertension: Secondary | ICD-10-CM

## 2022-08-03 DIAGNOSIS — E7800 Pure hypercholesterolemia, unspecified: Secondary | ICD-10-CM | POA: Diagnosis not present

## 2022-08-03 DIAGNOSIS — E538 Deficiency of other specified B group vitamins: Secondary | ICD-10-CM | POA: Diagnosis not present

## 2022-08-03 LAB — LIPID PANEL
Cholesterol: 76 mg/dL (ref 0–200)
HDL: 37.3 mg/dL — ABNORMAL LOW (ref >39.00)
LDL Cholesterol: 23 mg/dL (ref 0–99)
NonHDL: 39.17
Total CHOL/HDL Ratio: 2
Triglycerides: 80 mg/dL (ref 0.0–149.0)
VLDL: 16 mg/dL (ref 0.0–40.0)

## 2022-08-03 LAB — COMPREHENSIVE METABOLIC PANEL
ALT: 12 U/L (ref 0–53)
AST: 19 U/L (ref 0–37)
Albumin: 3.9 g/dL (ref 3.5–5.2)
Alkaline Phosphatase: 55 U/L (ref 39–117)
BUN: 25 mg/dL — ABNORMAL HIGH (ref 6–23)
CO2: 24 mEq/L (ref 19–32)
Calcium: 9.4 mg/dL (ref 8.4–10.5)
Chloride: 104 mEq/L (ref 96–112)
Creatinine, Ser: 1.25 mg/dL (ref 0.40–1.50)
GFR: 48.67 mL/min — ABNORMAL LOW (ref >60.00)
Glucose, Bld: 85 mg/dL (ref 70–99)
Potassium: 4.7 mEq/L (ref 3.5–5.1)
Sodium: 136 mEq/L (ref 135–145)
Total Bilirubin: 0.9 mg/dL (ref 0.2–1.2)
Total Protein: 6.1 g/dL (ref 6.0–8.3)

## 2022-08-03 LAB — VITAMIN B12: Vitamin B-12: 707 pg/mL (ref 211–911)

## 2022-08-03 LAB — TSH: TSH: 1.38 u[IU]/mL (ref 0.35–5.50)

## 2022-08-03 LAB — CBC
HCT: 35.5 % — ABNORMAL LOW (ref 39.0–52.0)
Hemoglobin: 11.9 g/dL — ABNORMAL LOW (ref 13.0–17.0)
MCHC: 33.6 g/dL (ref 30.0–36.0)
MCV: 100.1 fl — ABNORMAL HIGH (ref 78.0–100.0)
Platelets: 177 10*3/uL (ref 150.0–400.0)
RBC: 3.54 Mil/uL — ABNORMAL LOW (ref 4.22–5.81)
RDW: 15.2 % (ref 11.5–15.5)
WBC: 7.8 10*3/uL (ref 4.0–10.5)

## 2022-08-03 LAB — HEMOGLOBIN A1C: Hgb A1c MFr Bld: 5.8 % (ref 4.6–6.5)

## 2022-08-10 ENCOUNTER — Encounter: Payer: Self-pay | Admitting: Cardiology

## 2022-08-10 ENCOUNTER — Ambulatory Visit: Payer: Medicare Other | Attending: Cardiology | Admitting: Cardiology

## 2022-08-10 VITALS — BP 124/58 | HR 51 | Ht 71.0 in | Wt 147.1 lb

## 2022-08-10 DIAGNOSIS — I2510 Atherosclerotic heart disease of native coronary artery without angina pectoris: Secondary | ICD-10-CM | POA: Insufficient documentation

## 2022-08-10 DIAGNOSIS — E7800 Pure hypercholesterolemia, unspecified: Secondary | ICD-10-CM | POA: Insufficient documentation

## 2022-08-10 DIAGNOSIS — I714 Abdominal aortic aneurysm, without rupture, unspecified: Secondary | ICD-10-CM | POA: Diagnosis not present

## 2022-08-10 DIAGNOSIS — I255 Ischemic cardiomyopathy: Secondary | ICD-10-CM | POA: Diagnosis not present

## 2022-08-10 DIAGNOSIS — I10 Essential (primary) hypertension: Secondary | ICD-10-CM | POA: Insufficient documentation

## 2022-08-10 DIAGNOSIS — R002 Palpitations: Secondary | ICD-10-CM | POA: Diagnosis not present

## 2022-08-10 NOTE — Patient Instructions (Signed)
  Follow-Up: At [facility], you and your health needs are our priority.  As part of our continuing mission to provide you with exceptional heart care, we have created designated Provider Care Teams.  These Care Teams include your primary Cardiologist (physician) and Advanced Practice Providers (APPs -  Physician Assistants and Nurse Practitioners) who all work together to provide you with the care you need, when you need it.  We recommend signing up for the patient portal called "MyChart".  Sign up information is provided on this After Visit Summary.  MyChart is used to connect with patients for Virtual Visits (Telemedicine).  Patients are able to view lab/test results, encounter notes, upcoming appointments, etc.  Non-urgent messages can be sent to your provider as well.   To learn more about what you can do with MyChart, go to [email or web address].    Your next appointment:   6 month(s)  The format for your next appointment:   In Person  Provider:   Brian Crenshaw, MD    

## 2022-08-17 ENCOUNTER — Telehealth: Payer: Self-pay | Admitting: Family Medicine

## 2022-08-17 NOTE — Telephone Encounter (Signed)
Per 02/04/22 result note pt should be getting injection once a month.  Last injection was 07/18/22 and he is due for b12 injection tomorrow or after. Attempted to reach pt and left message to return my call. Needs nurse visit tomorrow or any day thereafter.

## 2022-08-17 NOTE — Telephone Encounter (Signed)
Pt called asking what was the frequency of his B12 shots. After reviewing chart, it looks like at one point he was doing them monthly but not sure as of now what the frequency is supposed to be. Please Advise.

## 2022-08-17 NOTE — Telephone Encounter (Signed)
Pt returned my call and was advised of below. Pt voices understanding and scheduled NV for 08/23/22 at 2:30pm.  He will have to confirm transportation and will let us know if appt will need to be changed.

## 2022-08-23 ENCOUNTER — Ambulatory Visit (INDEPENDENT_AMBULATORY_CARE_PROVIDER_SITE_OTHER): Payer: Medicare Other

## 2022-08-23 DIAGNOSIS — E538 Deficiency of other specified B group vitamins: Secondary | ICD-10-CM | POA: Diagnosis not present

## 2022-08-23 MED ORDER — CYANOCOBALAMIN 1000 MCG/ML IJ SOLN
1000.0000 ug | Freq: Once | INTRAMUSCULAR | Status: AC
  Administered 2022-08-23: 1000 ug via INTRAMUSCULAR

## 2022-08-23 NOTE — Progress Notes (Signed)
Daniel Reeves is a 88 y.o. male presents to the office today for Monthly B12 injection, per physician's orders. Original order:  02/02/2021- Last lab: 07/18/22 Cyanocobalamin (med), 1000 mcg/ml (dose),  IM (route) was administered L  Deltoid (location) today. Patient tolerated injection. Patient due for follow up labs/provider appt: Return in about 3 months (around 10/18/2022) for f/u visit.  with Dr Charlett Blake Patient next injection due: 1 month.  Creft, Darlis Loan

## 2022-08-31 DIAGNOSIS — R972 Elevated prostate specific antigen [PSA]: Secondary | ICD-10-CM | POA: Diagnosis not present

## 2022-09-14 ENCOUNTER — Other Ambulatory Visit: Payer: Self-pay | Admitting: Family Medicine

## 2022-09-16 DIAGNOSIS — R972 Elevated prostate specific antigen [PSA]: Secondary | ICD-10-CM | POA: Diagnosis not present

## 2022-09-16 DIAGNOSIS — R351 Nocturia: Secondary | ICD-10-CM | POA: Diagnosis not present

## 2022-09-16 DIAGNOSIS — N403 Nodular prostate with lower urinary tract symptoms: Secondary | ICD-10-CM | POA: Diagnosis not present

## 2022-09-20 DIAGNOSIS — M8008XD Age-related osteoporosis with current pathological fracture, vertebra(e), subsequent encounter for fracture with routine healing: Secondary | ICD-10-CM | POA: Diagnosis not present

## 2022-09-21 DIAGNOSIS — D1801 Hemangioma of skin and subcutaneous tissue: Secondary | ICD-10-CM | POA: Diagnosis not present

## 2022-09-21 DIAGNOSIS — L821 Other seborrheic keratosis: Secondary | ICD-10-CM | POA: Diagnosis not present

## 2022-09-21 DIAGNOSIS — L814 Other melanin hyperpigmentation: Secondary | ICD-10-CM | POA: Diagnosis not present

## 2022-09-21 DIAGNOSIS — C44519 Basal cell carcinoma of skin of other part of trunk: Secondary | ICD-10-CM | POA: Diagnosis not present

## 2022-09-21 DIAGNOSIS — Z85828 Personal history of other malignant neoplasm of skin: Secondary | ICD-10-CM | POA: Diagnosis not present

## 2022-09-21 DIAGNOSIS — C44311 Basal cell carcinoma of skin of nose: Secondary | ICD-10-CM | POA: Diagnosis not present

## 2022-09-21 DIAGNOSIS — X32XXXS Exposure to sunlight, sequela: Secondary | ICD-10-CM | POA: Diagnosis not present

## 2022-09-22 ENCOUNTER — Ambulatory Visit (INDEPENDENT_AMBULATORY_CARE_PROVIDER_SITE_OTHER): Payer: Medicare Other

## 2022-09-22 DIAGNOSIS — E538 Deficiency of other specified B group vitamins: Secondary | ICD-10-CM

## 2022-09-22 MED ORDER — CYANOCOBALAMIN 1000 MCG/ML IJ SOLN
1000.0000 ug | Freq: Once | INTRAMUSCULAR | Status: AC
  Administered 2022-09-22: 1000 ug via INTRAMUSCULAR

## 2022-09-22 NOTE — Progress Notes (Signed)
Daniel Reeves is a 88 y.o. male presents to the office today for Monthly B12 injection, per physician's orders. Original order:  02/02/2021- Last lab: 08/03/22 Cyanocobalamin (med), 1000 mcg/ml (dose),  IM (route) was administered L Deltoid (location) today. Patient tolerated injection. Patient due for follow up labs/provider appt: Return in about 3 months (around 10/18/2022) for f/u visit.  with Dr Charlett Blake Patient next injection due: 1 month, appointment pending. Will receive B12 injection at that time.   Creft, Darlis Loan

## 2022-10-17 ENCOUNTER — Other Ambulatory Visit: Payer: Self-pay | Admitting: Family Medicine

## 2022-10-17 DIAGNOSIS — I25700 Atherosclerosis of coronary artery bypass graft(s), unspecified, with unstable angina pectoris: Secondary | ICD-10-CM

## 2022-10-17 DIAGNOSIS — E7800 Pure hypercholesterolemia, unspecified: Secondary | ICD-10-CM

## 2022-10-17 DIAGNOSIS — I10 Essential (primary) hypertension: Secondary | ICD-10-CM

## 2022-10-18 ENCOUNTER — Other Ambulatory Visit: Payer: Medicare Other

## 2022-10-23 ENCOUNTER — Other Ambulatory Visit: Payer: Self-pay | Admitting: Family Medicine

## 2022-10-23 DIAGNOSIS — E7800 Pure hypercholesterolemia, unspecified: Secondary | ICD-10-CM

## 2022-10-23 DIAGNOSIS — I25700 Atherosclerosis of coronary artery bypass graft(s), unspecified, with unstable angina pectoris: Secondary | ICD-10-CM

## 2022-10-23 DIAGNOSIS — I10 Essential (primary) hypertension: Secondary | ICD-10-CM

## 2022-10-24 NOTE — Assessment & Plan Note (Deleted)
Well controlled, no changes to meds. Encouraged heart healthy diet such as the DASH diet and exercise as tolerated.  °

## 2022-10-24 NOTE — Assessment & Plan Note (Deleted)
Encourage heart healthy diet such as MIND or DASH diet, increase exercise, avoid trans fats, simple carbohydrates and processed foods, consider a krill or fish or flaxseed oil cap daily. Tolerating statin 

## 2022-10-24 NOTE — Assessment & Plan Note (Deleted)
Hydrate and monitor 

## 2022-10-25 ENCOUNTER — Ambulatory Visit: Payer: Medicare Other | Admitting: Family Medicine

## 2022-10-25 DIAGNOSIS — E7800 Pure hypercholesterolemia, unspecified: Secondary | ICD-10-CM

## 2022-10-25 DIAGNOSIS — M109 Gout, unspecified: Secondary | ICD-10-CM

## 2022-10-25 DIAGNOSIS — I10 Essential (primary) hypertension: Secondary | ICD-10-CM

## 2022-10-25 NOTE — Progress Notes (Shared)
Subjective:   By signing my name below, I, [name], attest that this documentation has been prepared under the direction and in the presence of [name], MD., 10/25/2022.     Patient ID: Daniel [facility], male    DOB: [birth year], 88 y.o.   MRN: [id number]  No chief complaint on file.  HPI Patient is in today for an office visit. Patient denies CP/ palp/ SOB/ HA/ congestion/fevers/GI or GU c/o.  Past Medical History:  Diagnosis Date   Abdominal aortic aneurysm ([place])    a. 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 s/p CABG in 1979 and 1993;  b. LHC (5/14):  LM, LAD, CFX and RCA occluded; L-LAD ok, dLAD occluded after insertion of LIMA, S-OM occluded, S-PDA/AM 80-90 => PCI with Promus DES; EF 25%   Cardiomyopathy, ischemic 06/05/2013   Cerumen impaction    Bilateral   Chicken pox as a child   Chronic systolic CHF (congestive heart failure) ([place])    COLONIC POLYPS 07/01/2008   Qualifier: Diagnosis of  By: Lenna Gilford MD, Scott M    Degenerative joint disease    DEGENERATIVE JOINT DISEASE 11/16/2007   Qualifier: Diagnosis of  By: Julien Girt CMA, Leigh     Diverticulosis of colon    DIVERTICULOSIS OF COLON 07/01/2008   Qualifier: Diagnosis of  By: Lenna Gilford MD, Deborra Medina    Double vision 03/07/2017   Essential hypertension 11/16/2007   Qualifier: Diagnosis of  By: Julien Girt CMA, Leigh      Fingernail abnormalities 03/07/2017   FLANK PAIN, RIGHT 02/03/2010   Qualifier: History of  By: Lenna Gilford MD, Deborra Medina    GERD (gastroesophageal reflux disease)    GOUT 11/16/2007   Qualifier: Diagnosis of  By: Julien Girt CMA, Leigh     Hearing loss 11/24/2014   Heart murmur    History of shingles 10/27/2017   Hypercholesterolemia    HYPERCHOLESTEROLEMIA 11/16/2007   Qualifier: Diagnosis of  By: Julien Girt CMA, Leigh     Ischemic cardiomyopathy    a. echo (09/05/13): EF 35%, diffuse HK worsened distal septal, mid/distal inferior and apical region, grade 1 diastolic dysfunction, mild LAE.     Kidney stone 08/17/2011   Loss of hearing    Lumbar back pain    Measles as a child   Medicare annual wellness visit, subsequent 11/24/2014   Sees Dr Delman Cheadle for dermatology Sees Dr Roni Bread of Urology Sees Dr Stanford Breed of cardiology Sees Dr Virginia Rochester of Opthamology No further colonoscopies warranted        Mumps as a child   Nephrolithiasis    Pain in joint, lower leg 07/29/2014   PERIPHERAL VASCULAR DISEASE 11/16/2007   Qualifier: Diagnosis of  By: Julien Girt CMA, Leigh     Peripheral vascular disease ([place])  Rectal bleeding 03/07/2017   Shingles 07/29/2014   Sun-damaged skin 05/31/2014   Past Surgical History:  Procedure Laterality Date   CORONARY ANGIOPLASTY WITH STENT PLACEMENT  04/18/2013   RCA        CORONARY ARTERY BYPASS GRAFT  1979   x4 SVG-DIAG-LAD, SVG-OM-PDA   CORONARY ARTERY BYPASS GRAFT  1993   Redo x5 by Dr Harlow Asa; Durward Fortes, SVG-OM, SVG-AM-PL   Decompressive laminectomy  01/2006   L2 - scarum by Dr. Shellia Carwin   HEMORRHOID SURGERY     fissure with hemorrhoid corrected at age 73   [place]   Right by Dr Lonzo Cloud HERNIA REPAIR  1996   Left by Dr. Harlow Asa   LEFT HEART CATHETERIZATION WITH CORONARY ANGIOGRAM N/A 09/23/2013   Procedure: LEFT HEART CATHETERIZATION WITH CORONARY ANGIOGRAM;  Surgeon: Blane Ohara, MD;  Location: [facility] CATH LAB;  Service: Cardiovascular;   Laterality: N/A;   lens implants     for vision correction   PERCUTANEOUS CORONARY STENT INTERVENTION (PCI-S) N/A 04/18/2013   Procedure: PERCUTANEOUS CORONARY STENT INTERVENTION (PCI-S);  Surgeon: Sherren Mocha, MD;  Location: [facility] CATH LAB;  Service: Cardiovascular;  Laterality: N/A;   TONSILLECTOMY     Family History  Problem Relation Age of Onset   Parkinsonism Brother    Diabetes Maternal Grandmother    Depression Daughter    Other Son        4 stents   Heart disease Son    Diabetes Son        type 2   Colon cancer Neg Hx    Esophageal cancer Neg Hx    Rectal cancer Neg Hx    Stomach cancer Neg Hx    Social History   Socioeconomic History   Marital status: Widowed    Spouse name: [name] x 72 years   Number of children: 7   Years of education: Not on file   Highest education level: Not on file  Occupational History   Occupation: Retired - Former Editor, 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   Other Topics Concern   Not on file  Social History Narrative   Married   7 children   Social Determinants of Health   Financial Resource Strain: Low Risk  (12/03/2021)   Overall Financial Resource Strain (CARDIA)    Difficulty of Paying Living Expenses: Not hard at all  Food Insecurity: No Food Insecurity (12/03/2021)   Hunger Vital Sign    Worried About Running Out of Food in the Last Year: Never true    Ran Out of Food in the Last Year: Never true  Transportation Needs: No Transportation Needs (12/03/2021)   PRAPARE - Armed forces logistics/support/administrative officer (Medical): No    Lack of Transportation (Non-Medical): No  Physical Activity: Inactive (12/03/2021)   Exercise Vital Sign    Days of Exercise per Week: 0 days    Minutes  of Exercise per Session: 0 min  Stress: No Stress Concern Present (12/03/2021)   [place]    Feeling of Stress : Not at all  Social Connections: Moderately Isolated (12/03/2021)   Social Connection and Isolation Panel [NHANES]    Frequency of Communication with Friends and Family: More than three times a week    Frequency of Social Gatherings with Friends and Family: More than three times a week    Attends Religious Services: 1 to 4 times per year    Active Member of [employer] or Organizations: No    Attends Archivist Meetings: Never    Marital Status: Widowed  Intimate Partner Violence: Not At Risk (12/03/2021)   Humiliation, Afraid, Rape, and Kick questionnaire    Fear of Current or Ex-Partner: No    Emotionally Abused: No    Physically Abused: No    Sexually Abused: No   Outpatient Medications Prior to Visit  Medication Sig Dispense Refill   allopurinol (ZYLOPRIM) 300 MG tablet Take 1 tablet by mouth once daily 90 tablet 0   aspirin EC 81 MG tablet Take 81 mg by mouth every morning.      b complex vitamins capsule Take 1 capsule by mouth daily.     busPIRone (BUSPAR) 5 MG tablet Take 1 tablet by mouth twice daily as needed 60 tablet 0   Cholecalciferol (VITAMIN D-3 PO) Take 5,000 Units by mouth daily with breakfast.     famotidine (PEPCID) 20 MG tablet Take 1 tablet (20 mg total) by mouth 2 (two) times daily. 180 tablet 1   finasteride (PROSCAR) 5 MG tablet Takes every third day     folic acid (FOLVITE) 878 MCG tablet Take 400 mcg by mouth 2 (two) times daily.     isosorbide mononitrate (IMDUR) 30 MG 24 hr tablet Take 3 tablets by mouth once daily 270 tablet 0   losartan (COZAAR) 50 MG tablet Take 1 tablet by mouth once  daily 90 tablet 3   metoprolol succinate (TOPROL-XL) 25 MG 24 hr tablet Take 1/2 (one-half) tablet by mouth once daily 45 tablet 3   Misc Natural Products (OSTEO BI-FLEX ADV JOINT SHIELD) TABS Take 1 tablet by mouth 2 (two) times daily.     Multiple Vitamin (MULTIVITAMIN) tablet Take 1 tablet by mouth daily.     nitroGLYCERIN (NITROSTAT) 0.4 MG SL tablet DISSOLVE ONE TABLET UNDER THE TONGUE EVERY 5 MINUTES AS NEEDED FOR CHEST PAIN.  DO NOT EXCEED A TOTAL OF 3 DOSES IN 15 MINUTES 25 tablet 2   simvastatin (ZOCOR) 40 MG tablet Take 1 tablet (40 mg total) by mouth every evening. 90 tablet 0   No facility-administered medications prior to visit.   Allergies  Allergen Reactions   Baclofen Other (See Comments)    Altered mental status   Celecoxib Other (See Comments)    Altered mental status   Lisinopril     REACTION: dizziness   Methocarbamol     REACTION: pt states "dizzy"   Other    Pregabalin     REACTION: pt states "dizzy"   Ramipril     REACTION: hives and dizziness   Tramadol Other (See Comments)    Altered mental status   Review of Systems  Constitutional:  Negative for chills and fever.  HENT:  Negative for congestion.   Respiratory:  Negative for shortness of breath.   Cardiovascular:  Negative for chest pain and palpitations.  Gastrointestinal:  Negative  for abdominal pain, blood in stool, constipation, diarrhea, nausea and vomiting.  Genitourinary:  Negative for dysuria, frequency, hematuria and urgency.  Skin:           Neurological:  Negative for headaches.      Objective:    Physical Exam Constitutional:      General: He is not in acute distress.    Appearance: Normal appearance. He is normal weight. He is not ill-appearing.  HENT:     Head: Normocephalic and atraumatic.     Right Ear: External ear normal.     Left Ear: External ear normal.     Nose: Nose normal.     Mouth/Throat:     Mouth: Mucous membranes are moist.     Pharynx: Oropharynx is clear.   Eyes:     General:        Right eye: No discharge.        Left eye: No discharge.     Extraocular Movements: Extraocular movements intact.     Pupils: Pupils are equal, round, and reactive to light.  Cardiovascular:     Rate and Rhythm: Normal rate and regular rhythm.     Pulses: Normal pulses.     Heart sounds: Normal heart sounds. No murmur heard.    No gallop.  Pulmonary:     Effort: Pulmonary effort is normal. No respiratory distress.     Breath sounds: Normal breath sounds. No wheezing or rales.  Abdominal:     General: Bowel sounds are normal.     Tenderness: There is no abdominal tenderness. There is no guarding.  Musculoskeletal:        General: Normal range of motion.     Right lower leg: No edema.     Left lower leg: No edema.  Skin:    General: Skin is warm and dry.  Neurological:     Mental Status: He is alert and oriented to person, place, and time.  Psychiatric:        Mood and Affect: Mood normal.        Behavior: Behavior normal.        Judgment: Judgment normal.    There were no vitals taken for this visit. Wt Readings from Last 3 Encounters:  08/10/22 147 lb 1.9 oz (66.7 kg)  07/29/22 148 lb 12.8 oz (67.5 kg)  07/18/22 145 lb 8 oz (66 kg)   Diabetic Foot Exam - Simple   No data filed    Lab Results  Component Value Date   WBC 7.8 08/03/2022   HGB 11.9 (L) 08/03/2022   HCT 35.5 (L) 08/03/2022   PLT 177.0 08/03/2022   GLUCOSE 85 08/03/2022   CHOL 76 08/03/2022   TRIG 80.0 08/03/2022   HDL 37.30 (L) 08/03/2022   LDLCALC 23 08/03/2022   ALT 12 08/03/2022   AST 19 08/03/2022   NA 136 08/03/2022   K 4.7 08/03/2022   CL 104 08/03/2022   CREATININE 1.25 08/03/2022   BUN 25 (H) 08/03/2022   CO2 24 08/03/2022   TSH 1.38 08/03/2022   PSA 25.40 12/08/2021   INR 1.09 02/04/2018   HGBA1C 5.8 08/03/2022   Lab Results  Component Value Date   TSH 1.38 08/03/2022   Lab Results  Component Value Date   WBC 7.8 08/03/2022   HGB 11.9 (L)  08/03/2022   HCT 35.5 (L) 08/03/2022   MCV 100.1 (H) 08/03/2022   PLT 177.0 08/03/2022   Lab Results  Component Value Date  NA 136 08/03/2022   K 4.7 08/03/2022   CO2 24 08/03/2022   GLUCOSE 85 08/03/2022   BUN 25 (H) 08/03/2022   CREATININE 1.25 08/03/2022   BILITOT 0.9 08/03/2022   ALKPHOS 55 08/03/2022   AST 19 08/03/2022   ALT 12 08/03/2022   PROT 6.1 08/03/2022   ALBUMIN 3.9 08/03/2022   CALCIUM 9.4 08/03/2022   ANIONGAP 7 11/30/2019   GFR 48.67 (L) 08/03/2022   Lab Results  Component Value Date   CHOL 76 08/03/2022   Lab Results  Component Value Date   HDL 37.30 (L) 08/03/2022   Lab Results  Component Value Date   LDLCALC 23 08/03/2022   Lab Results  Component Value Date   TRIG 80.0 08/03/2022   Lab Results  Component Value Date   CHOLHDL 2 08/03/2022   Lab Results  Component Value Date   HGBA1C 5.8 08/03/2022      Assessment & Plan:   Problem List Items Addressed This Visit       Cardiovascular and Mediastinum   Essential hypertension - Primary     Other   HYPERCHOLESTEROLEMIA   Gout   No orders of the defined types were placed in this encounter.  I, [name], personally preformed the services described in this documentation.  All medical record entries made by the scribe were at my direction and in my presence.  I have reviewed the chart and discharge instructions (if applicable) and agree that the record reflects my personal performance and is accurate and complete. 10/25/2022  I,Mohammed Iqbal,acting as a scribe for Penni Homans, MD.,have documented all relevant documentation on the behalf of Penni Homans, MD,as directed by  Penni Homans, MD while in the presence of Penni Homans, MD.  [name]

## 2022-10-31 ENCOUNTER — Encounter (HOSPITAL_BASED_OUTPATIENT_CLINIC_OR_DEPARTMENT_OTHER): Payer: Self-pay

## 2022-10-31 ENCOUNTER — Other Ambulatory Visit: Payer: Self-pay

## 2022-10-31 ENCOUNTER — Telehealth: Payer: Self-pay | Admitting: Family Medicine

## 2022-10-31 ENCOUNTER — Emergency Department (HOSPITAL_BASED_OUTPATIENT_CLINIC_OR_DEPARTMENT_OTHER)
Admission: EM | Admit: 2022-10-31 | Discharge: 2022-10-31 | Disposition: A | Discharge status: Home / Self Care | Payer: Medicare Other | Attending: Emergency Medicine | Admitting: Emergency Medicine

## 2022-10-31 DIAGNOSIS — Z79899 Other long term (current) drug therapy: Secondary | ICD-10-CM | POA: Diagnosis not present

## 2022-10-31 DIAGNOSIS — Z7982 Long term (current) use of aspirin: Secondary | ICD-10-CM | POA: Insufficient documentation

## 2022-10-31 DIAGNOSIS — H7292 Unspecified perforation of tympanic membrane, left ear: Secondary | ICD-10-CM | POA: Insufficient documentation

## 2022-10-31 DIAGNOSIS — H938X2 Other specified disorders of left ear: Secondary | ICD-10-CM | POA: Diagnosis present

## 2022-10-31 MED ORDER — CIPROFLOXACIN-DEXAMETHASONE 0.3-0.1 % OT SUSP
4.0000 [drp] | Freq: Once | OTIC | Status: AC
  Administered 2022-10-31: 4 [drp] via OTIC

## 2022-10-31 MED ORDER — CIPROFLOXACIN-DEXAMETHASONE 0.3-0.1 % OT SUSP: 4.0000 [drp] | Freq: Two times a day (BID) | OTIC | 0 refills | Status: DC

## 2022-10-31 NOTE — Telephone Encounter (Signed)
Per Juanda Crumble patients daughter will be taking him to ER because she believes he needs to go.

## 2022-10-31 NOTE — ED Triage Notes (Signed)
Pt c/o "oozing blood" from L ear, denies associated pain, on baby ASA, but states he did not take any meds PTA  12/7 follow up w PCP

## 2022-10-31 NOTE — Telephone Encounter (Signed)
Pt stated he woke up at 4 this morning with blood coming out of his ear. Warm transferred to triage.

## 2022-10-31 NOTE — Discharge Instructions (Addendum)
It appears that your left tympanic membrane is likely ruptured or perforated. Start applying the antibiotics that are prescribed. Call the ENT doctor for a follow-up appointment in 7 to 14 days.

## 2022-10-31 NOTE — Telephone Encounter (Signed)
Called pt to check on him, pt stated he was ok not sure what happen. Pt stated  Was sleeping and wake up with some bleeding from earlobe, no pain or discomfort.Pt has appt on 11/03/22 With [place] states he will be ok till then.

## 2022-10-31 NOTE — ED Provider Notes (Signed)
[facility] Provider Note   CSN: [id number] Arrival date & time: 10/31/22  1004     History  Chief Complaint  Patient presents with   Ear Drainage    Daniel Reeves is a 88 y.o. male.  HPI    88 year old male comes in with chief complaint of ear drainage.  Patient states that he woke up in the middle night with blood oozing out of his left ear.  He denies any trauma.  He is unsure if he has a habit of picking his ear in his sleep.  He denies any Q-tip usage.  He denies any ringing in the ear, hearing loss, balance issues.  Home Medications Prior to Admission medications   Medication Sig Start Date End Date Taking? Authorizing Provider  allopurinol (ZYLOPRIM) 300 MG tablet Take 1 tablet by mouth once daily 06/08/22   [name], MD  aspirin EC 81 MG tablet Take 81 mg by mouth every morning.     [provider]  b complex vitamins capsule Take 1 capsule by mouth daily.    [provider]  busPIRone (BUSPAR) 5 MG tablet Take 1 tablet by mouth twice daily as needed 07/04/22   [name], MD  Cholecalciferol (VITAMIN D-3 PO) Take 5,000 Units by mouth daily with breakfast.    [provider]  famotidine (PEPCID) 20 MG tablet Take 1 tablet (20 mg total) by mouth 2 (two) times daily. 06/14/22   [name], MD  finasteride (PROSCAR) 5 MG tablet Takes every third day    [provider]  folic acid (FOLVITE) 884 MCG tablet Take 400 mcg by mouth 2 (two) times daily.    [provider]  isosorbide mononitrate (IMDUR) 30 MG 24 hr tablet Take 3 tablets by mouth once daily 09/14/22   [name], MD  losartan (COZAAR) 50 MG tablet Take 1 tablet by mouth once daily 02/04/22   Lelon Perla, MD  metoprolol succinate (TOPROL-XL) 25 MG 24 hr tablet Take 1/2 (one-half) tablet by mouth once daily 09/23/21   Lelon Perla, MD  Misc Natural Products (OSTEO BI-FLEX ADV JOINT SHIELD) TABS Take 1 tablet  by mouth 2 (two) times daily.    [provider]  Multiple Vitamin (MULTIVITAMIN) tablet Take 1 tablet by mouth daily.    [provider]  nitroGLYCERIN (NITROSTAT) 0.4 MG SL tablet DISSOLVE ONE TABLET UNDER THE TONGUE EVERY 5 MINUTES AS NEEDED FOR CHEST PAIN.  DO NOT EXCEED A TOTAL OF 3 DOSES IN 15 MINUTES 05/22/20   Lelon Perla, MD  simvastatin (ZOCOR) 40 MG tablet Take 1 tablet (40 mg total) by mouth every evening. 10/17/22   [name], MD      Allergies    Baclofen, Celecoxib, Lisinopril, Methocarbamol, Other, Pregabalin, Ramipril, and Tramadol    Review of Systems   Review of Systems  Physical Exam Updated Vital Signs BP (!) 143/80   Pulse 72   Temp 98.2 F (36.8 C)   Resp 16   SpO2 100%  Physical Exam Vitals and nursing note reviewed.  Constitutional:      Appearance: He is well-developed.  HENT:     Head: Atraumatic.     Comments: External canal of the left ear has dried blood in it.  I cannot visualize the tympanic membrane.  Patient has baseline hearing loss.  Unable to determine if the hearing loss is worse. Eyes:     Pupils: Pupils  are equal, round, and reactive to light.  Cardiovascular:     Rate and Rhythm: Normal rate.  Pulmonary:     Effort: Pulmonary effort is normal.  Musculoskeletal:     Cervical back: Neck supple.  Skin:    General: Skin is warm.  Neurological:     Mental Status: He is alert and oriented to person, place, and time.     ED Results / Procedures / Treatments   Labs (all labs ordered are listed, but only abnormal results are displayed) Labs Reviewed - No data to display  EKG None  Radiology No results found.  Procedures Procedures    Medications Ordered in ED Medications  ciprofloxacin-dexamethasone (CIPRODEX) 0.3-0.1 % OTIC (EAR) suspension 4 drop (4 drops Left EAR Given 10/31/22 1447)    ED Course/ Medical Decision Making/ A&P                           Medical Decision Making 88 year old  patient comes in with chief complaint of bleeding from the left ear. It appears that he has a ruptured left TM.  No clear evidence of hearing loss or ringing in the ear.  Patient denies any gushing noise either.  He has no nystagmus.  We will put him on Ciprodex.  We will advised him to follow-up with ENT.  Risk Prescription drug management.     Final Clinical Impression(s) / ED Diagnoses Final diagnoses:  Perforated tympanic membrane on examination, left    Rx / DC Orders ED Discharge Orders          Ordered    ciprofloxacin-dexamethasone (CIPRODEX) OTIC suspension  2 times daily,   Status:  Discontinued        10/31/22 1437              Varney Biles, MD 10/31/22 1531

## 2022-11-01 NOTE — Progress Notes (Signed)
Subjective:   By signing my name below, I, Daniel Reeves, attest that this documentation has been prepared under the direction and in the presence of Daniel Reeves., 11/03/2022.    Patient ID: Daniel Reeves, male    DOB: [birth year], 88 y.o.   MRN: [id number]  Chief Complaint  Patient presents with   Follow-up    Follow up   HPI Patient is in today for an office visit and is accompanied by his daughter. Denies CP/ palpitations/ SOB/ HA/ congestion/ fevers/GI or GU c/o.  Dental He had a tooth removed last week which will be artificially replaced. He denies residual pain.  Dermatology Patient reports that the scab on his right nostril reappears every time he removes it. Denies pain or tenderness upon touch. He is not interested in undergoing a grafting procedure.  Perforated Tympanic Membrane (Left Ear) Patient was admitted to the ED on 10/31/2022 due to a perforated tympanic membrane of the left ear. He reports that he awoke with blood running out of his ear and denies any trauma. He has baseline hearing loss. He is currently 3 days into a 7 day regimen of Ciprodex otic suspension which he uses twice daily.   Weight Management Body mass index is 19.67 kg/Reeves. Wt Readings from Last 3 Encounters:  11/03/22 145 lb (65.8 kg)  08/10/22 147 lb 1.9 oz (66.7 kg)  07/29/22 148 lb 12.8 oz (67.5 kg)   Past Medical History:  Diagnosis Date   Abdominal aortic aneurysm ([place])    a. 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 s/p CABG in 1979 and 1993;  b. LHC (5/14):  LM, LAD, CFX and RCA occluded; L-LAD ok, dLAD occluded after insertion of LIMA, S-OM occluded, S-PDA/AM 80-90 => PCI with Promus DES; EF 25%   Cardiomyopathy, ischemic 06/05/2013   Cerumen impaction    Bilateral   Chicken pox as a child   Chronic systolic CHF (congestive heart failure) ([place])    COLONIC POLYPS 07/01/2008   Qualifier: Diagnosis of  By: Daniel Reeves, Daniel Reeves    Degenerative joint disease    DEGENERATIVE JOINT DISEASE 11/16/2007   Qualifier: Diagnosis of  By: Daniel Reeves, Daniel Reeves     Diverticulosis of colon    DIVERTICULOSIS OF COLON 07/01/2008   Qualifier: Diagnosis of  By: Daniel Reeves, Daniel Reeves    Double vision 03/07/2017   Essential hypertension 11/16/2007   Qualifier: Diagnosis of  By: Daniel Reeves, Daniel Reeves     Fingernail abnormalities 03/07/2017   FLANK PAIN, RIGHT 02/03/2010   Qualifier: History of  By: Daniel Reeves, Daniel Reeves    GERD (gastroesophageal reflux disease)    GOUT 11/16/2007   Qualifier: Diagnosis of  By: Daniel Reeves, Marliss Czar     Hearing  loss 11/24/2014   Heart murmur    History of shingles 10/27/2017   Hypercholesterolemia    HYPERCHOLESTEROLEMIA 11/16/2007   Qualifier: Diagnosis of  By: Daniel Reeves, Daniel Reeves     Ischemic cardiomyopathy    a. echo (09/05/13): EF 35%, diffuse HK worsened distal septal, mid/distal inferior and apical region, grade 1 diastolic dysfunction, mild LAE.     Kidney stone 08/17/2011   Loss of hearing    Lumbar back pain    Measles as a child   Medicare annual wellness visit, subsequent 11/24/2014   Sees Daniel Reeves for dermatology Sees Daniel Reeves of Urology Sees Daniel Reeves of cardiology Sees Daniel Reeves of Opthamology No further colonoscopies warranted        Mumps as a child   Nephrolithiasis    Pain in joint, lower leg 07/29/2014   PERIPHERAL VASCULAR DISEASE 11/16/2007    Qualifier: Diagnosis of  By: Daniel Reeves, Daniel Reeves     Peripheral vascular disease ([place])    Rectal bleeding 03/07/2017   Shingles 07/29/2014   Sun-damaged skin 05/31/2014   Past Surgical History:  Procedure Laterality Date   CORONARY ANGIOPLASTY WITH STENT PLACEMENT  04/18/2013   RCA        CORONARY ARTERY BYPASS GRAFT  1979   x4 SVG-DIAG-LAD, SVG-OM-PDA   CORONARY ARTERY BYPASS GRAFT  1993   Redo x5 by Daniel Daniel Reeves; Daniel Reeves, SVG-OM, SVG-AM-PL   Decompressive laminectomy  01/2006   L2 - scarum by Daniel. Shellia Reeves   HEMORRHOID SURGERY     fissure with hemorrhoid corrected at age 50   [place]   Right by Daniel Reeves HERNIA REPAIR  1996   Left by Daniel. Harlow Reeves   LEFT HEART CATHETERIZATION WITH CORONARY ANGIOGRAM N/A 09/23/2013   Procedure: [place];  Surgeon: Daniel Ohara, Reeves;  Location[facility] CATH LAB;  Service: Cardiovascular;  Laterality: N/A;   lens implants     for vision correction   PERCUTANEOUS CORONARY STENT INTERVENTION (PCI-S) N/A 04/18/2013   Procedure: PERCUTANEOUS CORONARY STENT INTERVENTION (PCI-S);  Surgeon: Daniel Mocha, Reeves;  Location: [facility] CATH LAB;  Service: Cardiovascular;  Laterality: N/A;   TONSILLECTOMY     Family History  Problem Relation Age of Onset   Parkinsonism Brother    Diabetes Maternal Grandmother    Depression Daughter    Other Son        4 stents   Heart disease Son    Diabetes Son        type 2   Colon cancer Neg Hx    Esophageal cancer Neg Hx    Rectal cancer Neg Hx    Stomach cancer Neg Hx    Social History   Socioeconomic History   Marital status: Widowed    Spouse name: [name] x 72 years   Number of children: 7   Years of education: Not on file   Highest education level: Not on file  Occupational History   Occupation: Retired - Former Editor, 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   Other Topics Concern   Not on file  Social History Narrative   Married   7 children   Social Determinants of Health   Financial Resource Strain: Low Risk  (12/03/2021)   Overall Financial Resource Strain (CARDIA)    Difficulty of Paying Living Expenses: Not hard at all  Food Insecurity: No Food Insecurity (12/03/2021)   Hunger Vital Sign    Worried About Running Out of Food in the Last Year: Never true    Ran Out of Food in the Last Year: Never true  Transportation Needs: No Transportation Needs (12/03/2021)   PRAPARE - Hydrologist (Medical): No    Lack of Transportation (Non-Medical): No  Physical Activity: Inactive (12/03/2021)   Exercise Vital Sign    Days of Exercise per Week: 0 days    Minutes of Exercise per Session: 0 min  Stress: No Stress Concern Present (12/03/2021)   [place]    Feeling of Stress : Not at all  Social Connections: Moderately Isolated (12/03/2021)   Social Connection and Isolation Panel [NHANES]    Frequency of Communication with Friends and Family: More than three times a week    Frequency of Social Gatherings with Friends and Family: More than three times a week    Attends Religious Services: 1 to 4 times per year    Active Member of [employer] or Organizations: No    Attends Archivist Meetings: Never    Marital Status: Widowed  Intimate Partner Violence: Not At Risk (12/03/2021)   Humiliation, Afraid, Rape, and Kick questionnaire    Fear of Current or Ex-Partner: No    Emotionally Abused: No     Physically Abused: No    Sexually Abused: No   Outpatient Medications Prior to Visit  Medication Sig Dispense Refill   allopurinol (ZYLOPRIM) 300 MG tablet Take 1 tablet by mouth once daily 90 tablet 0   aspirin EC 81 MG tablet Take 81 mg by mouth every morning.      b complex vitamins capsule Take 1 capsule by mouth daily.     busPIRone (BUSPAR) 5 MG tablet Take 1 tablet by mouth twice daily as needed 60 tablet 0   Cholecalciferol (VITAMIN D-3 PO) Take 5,000 Units by mouth daily with breakfast.     famotidine (PEPCID) 20 MG tablet Take 1 tablet (20 mg total) by mouth 2 (two) times daily. 180 tablet 1   finasteride (PROSCAR) 5 MG tablet Takes every third day     folic acid (FOLVITE) 277 MCG tablet Take 400 mcg by mouth 2 (two) times daily.     isosorbide mononitrate (IMDUR) 30 MG 24 hr tablet Take 3 tablets by mouth once daily 270 tablet 0   losartan (COZAAR) 50 MG tablet Take 1 tablet by mouth once daily 90 tablet 3   metoprolol succinate (TOPROL-XL) 25 MG 24 hr tablet Take 1/2 (one-half) tablet by mouth once daily 45 tablet 3   Misc Natural Products (OSTEO BI-FLEX ADV JOINT SHIELD) TABS Take 1 tablet by mouth 2 (two) times daily.     Multiple Vitamin (MULTIVITAMIN) tablet Take 1 tablet by mouth daily.     nitroGLYCERIN (NITROSTAT) 0.4 MG SL tablet DISSOLVE ONE TABLET UNDER THE TONGUE EVERY 5 MINUTES AS NEEDED FOR CHEST PAIN.  DO NOT  EXCEED A TOTAL OF 3 DOSES IN 15 MINUTES 25 tablet 2   simvastatin (ZOCOR) 40 MG tablet Take 1 tablet (40 mg total) by mouth every evening. 90 tablet 0   No facility-administered medications prior to visit.   Allergies  Allergen Reactions   Baclofen Other (See Comments)    Altered mental status   Celecoxib Other (See Comments)    Altered mental status   Lisinopril     REACTION: dizziness   Methocarbamol     REACTION: pt states "dizzy"   Other    Pregabalin     REACTION: pt states "dizzy"   Ramipril     REACTION: hives and dizziness   Tramadol  Other (See Comments)    Altered mental status   Review of Systems  Constitutional:  Negative for chills and fever.  HENT:  Negative for congestion.   Respiratory:  Negative for shortness of breath.   Cardiovascular:  Negative for chest pain and palpitations.  Gastrointestinal:  Negative for abdominal pain, blood in stool, constipation, diarrhea, nausea and vomiting.  Genitourinary:  Negative for dysuria, frequency, hematuria and urgency.  Skin:           Neurological:  Negative for headaches.      Objective:    Physical Exam Exam conducted with a chaperone present.  Constitutional:      General: He is not in acute distress.    Appearance: Normal appearance. He is normal weight. He is not ill-appearing.  HENT:     Head: Normocephalic and atraumatic.     Right Ear: Tympanic membrane, ear canal and external ear normal.     Left Ear: Ear canal and external ear normal. Decreased hearing noted. Tympanic membrane is perforated. Tympanic membrane is not erythematous or retracted.     Nose: Nose normal.     Mouth/Throat:     Mouth: Mucous membranes are moist.     Pharynx: Oropharynx is clear.  Eyes:     General:        Right eye: No discharge.        Left eye: No discharge.     Extraocular Movements: Extraocular movements intact.     Conjunctiva/sclera: Conjunctivae normal.     Pupils: Pupils are equal, round, and reactive to light.  Cardiovascular:     Rate and Rhythm: Normal rate and regular rhythm.     Pulses: Normal pulses.     Heart sounds: Normal heart sounds. No murmur heard.    No gallop.  Pulmonary:     Effort: Pulmonary effort is normal. No respiratory distress.     Breath sounds: Normal breath sounds. No wheezing or rales.  Abdominal:     General: Bowel sounds are normal.     Palpations: Abdomen is soft.     Tenderness: There is no abdominal tenderness. There is no guarding.  Musculoskeletal:        General: Normal range of motion.     Cervical back: Normal range  of motion.     Right lower leg: No edema.     Left lower leg: No edema.  Skin:    General: Skin is warm and dry.  Neurological:     Mental Status: He is alert and oriented to person, place, and time.  Psychiatric:        Mood and Affect: Mood normal.        Behavior: Behavior normal.        Judgment: Judgment normal.    BP 120/76 (  BP Location: Right Arm, Patient Position: Sitting, Cuff Size: Normal)   Pulse 65   Temp 98 F (36.7 C) (Oral)   Resp 16   Ht 6' (1.829 Reeves)   Wt 145 lb (65.8 kg)   SpO2 98%   BMI 19.67 kg/Reeves  Wt Readings from Last 3 Encounters:  11/03/22 145 lb (65.8 kg)  08/10/22 147 lb 1.9 oz (66.7 kg)  07/29/22 148 lb 12.8 oz (67.5 kg)   Diabetic Foot Exam - Simple   No data filed    Lab Results  Component Value Date   WBC 7.2 11/03/2022   HGB 11.6 (L) 11/03/2022   HCT 33.9 (L) 11/03/2022   PLT 160.0 11/03/2022   GLUCOSE 118 (H) 11/03/2022   CHOL 77 11/03/2022   TRIG 83.0 11/03/2022   HDL 40.30 11/03/2022   LDLCALC 20 11/03/2022   ALT 20 11/03/2022   AST 28 11/03/2022   NA 141 11/03/2022   K 4.3 11/03/2022   CL 108 11/03/2022   CREATININE 1.24 11/03/2022   BUN 41 (H) 11/03/2022   CO2 27 11/03/2022   TSH 1.17 11/03/2022   PSA 25.40 12/08/2021   INR 1.09 02/04/2018   HGBA1C 5.9 11/03/2022   Lab Results  Component Value Date   TSH 1.17 11/03/2022   Lab Results  Component Value Date   WBC 7.2 11/03/2022   HGB 11.6 (L) 11/03/2022   HCT 33.9 (L) 11/03/2022   MCV 100.8 (H) 11/03/2022   PLT 160.0 11/03/2022   Lab Results  Component Value Date   NA 141 11/03/2022   K 4.3 11/03/2022   CO2 27 11/03/2022   GLUCOSE 118 (H) 11/03/2022   BUN 41 (H) 11/03/2022   CREATININE 1.24 11/03/2022   BILITOT 0.9 11/03/2022   ALKPHOS 65 11/03/2022   AST 28 11/03/2022   ALT 20 11/03/2022   PROT 6.1 11/03/2022   ALBUMIN 4.0 11/03/2022   CALCIUM 9.2 11/03/2022   ANIONGAP 7 11/30/2019   GFR 49.05 (L) 11/03/2022   Lab Results  Component Value Date    CHOL 77 11/03/2022   Lab Results  Component Value Date   HDL 40.30 11/03/2022   Lab Results  Component Value Date   LDLCALC 20 11/03/2022   Lab Results  Component Value Date   TRIG 83.0 11/03/2022   Lab Results  Component Value Date   CHOLHDL 2 11/03/2022   Lab Results  Component Value Date   HGBA1C 5.9 11/03/2022      Assessment & Plan:   Immunizations Patient has been informed about receiving the RSV immunization. He will receive a high-dose Influenza immunization today. Immunization History  Administered Date(s) Administered   Fluad Quad(high Dose 65+) 08/13/2019, 11/03/2020, 09/07/2021, 11/03/2022   Influenza, High Dose Seasonal PF 08/25/2016, 09/04/2017, 10/03/2018   Influenza,inj,Quad PF,6+ Mos 10/10/2013, 11/24/2014, 11/16/2015   PFIZER(Purple Top)SARS-COV-2 Vaccination 01/30/2020, 02/26/2020, 10/19/2020   Pfizer Covid-19 Vaccine Bivalent Booster 64yr & up 12/06/2021   Pneumococcal Conjugate-13 11/16/2015   Pneumococcal Polysaccharide-23 07/11/2013   Tdap 07/11/2013, 04/26/2019   Zoster Recombinat (Shingrix) 12/06/2021   Zoster, Live 12/17/2015   Labs Blood work today will check gout along with routine markers.  Perforated Tympanic Membrane (Left Ear) An ENT referral has been made today.  Vitamin B12 Patient will receive a vitamin B12 injection today.  Problem List Items Addressed This Visit     HYPERCHOLESTEROLEMIA    Encourage heart healthy diet such as MIND or DASH diet, increase exercise, avoid trans fats, simple carbohydrates and processed foods,  consider a krill or fish or flaxseed oil cap daily. Tolerating statin.        Relevant Orders   Lipid panel (Completed)   Gout    Hydrate and monitor, uric acid now low, stop Allopurinol      Relevant Orders   Uric acid (Completed)   Essential hypertension - Primary    Well controlled, no changes to meds. Encouraged heart healthy diet such as the DASH diet and exercise as tolerated.         Relevant Orders   CBC with Differential/Platelet (Completed)   Comprehensive metabolic panel (Completed)   TSH (Completed)   GERD    Avoid offending foods, start probiotics. Do not eat large meals in late evening and consider raising head of bed. Refill given on Famotidine today      Hearing loss   Relevant Orders   Ambulatory referral to ENT   Hyperglycemia    hgba1c acceptable, minimize simple carbs. Increase exercise as tolerated.       Relevant Orders   Hemoglobin A1c (Completed)   Tympanic membrane perforation, left    Ith hearing loss, he is continuing the drops given him at the ER. Is referred to ENT for further consideraiton      B12 deficiency   Other Visit Diagnoses     Need for influenza vaccination       Relevant Orders   Flu Vaccine QUAD High Dose(Fluad) (Completed)      Meds ordered this encounter  Medications   cyanocobalamin (VITAMIN B12) injection 1,000 mcg   I, Penni Homans, Reeves, personally preformed the services described in this documentation.  All medical record entries made by the scribe were at my direction and in my presence.  I have reviewed the chart and discharge instructions (if applicable) and agree that the record reflects my personal performance and is accurate and complete. 11/03/2022  I,Mohammed Iqbal,acting as a scribe for Penni Homans, Reeves.,have documented all relevant documentation on the behalf of Penni Homans, Reeves,as directed by  Penni Homans, Reeves while in the presence of Penni Homans, Reeves.  Penni Homans, Reeves

## 2022-11-02 NOTE — Assessment & Plan Note (Signed)
Avoid offending foods, start probiotics. Do not eat large meals in late evening and consider raising head of bed. Refill given on Famotidine today

## 2022-11-02 NOTE — Assessment & Plan Note (Addendum)
Hydrate and monitor, uric acid now low, stop Allopurinol

## 2022-11-02 NOTE — Assessment & Plan Note (Signed)
Encourage heart healthy diet such as MIND or DASH diet, increase exercise, avoid trans fats, simple carbohydrates and processed foods, consider a krill or fish or flaxseed oil cap daily. Tolerating statin 

## 2022-11-02 NOTE — Assessment & Plan Note (Signed)
hgba1c acceptable, minimize simple carbs. Increase exercise as tolerated.  

## 2022-11-02 NOTE — Assessment & Plan Note (Signed)
Well controlled, no changes to meds. Encouraged heart healthy diet such as the DASH diet and exercise as tolerated.  °

## 2022-11-03 ENCOUNTER — Ambulatory Visit (INDEPENDENT_AMBULATORY_CARE_PROVIDER_SITE_OTHER): Payer: Medicare Other | Admitting: Family Medicine

## 2022-11-03 VITALS — BP 120/76 | HR 65 | Temp 98.0°F | Resp 16 | Ht 72.0 in | Wt 145.0 lb

## 2022-11-03 DIAGNOSIS — I10 Essential (primary) hypertension: Secondary | ICD-10-CM | POA: Diagnosis not present

## 2022-11-03 DIAGNOSIS — R739 Hyperglycemia, unspecified: Secondary | ICD-10-CM | POA: Diagnosis not present

## 2022-11-03 DIAGNOSIS — M109 Gout, unspecified: Secondary | ICD-10-CM

## 2022-11-03 DIAGNOSIS — E538 Deficiency of other specified B group vitamins: Secondary | ICD-10-CM | POA: Diagnosis not present

## 2022-11-03 DIAGNOSIS — H7292 Unspecified perforation of tympanic membrane, left ear: Secondary | ICD-10-CM

## 2022-11-03 DIAGNOSIS — E7800 Pure hypercholesterolemia, unspecified: Secondary | ICD-10-CM | POA: Diagnosis not present

## 2022-11-03 DIAGNOSIS — H9190 Unspecified hearing loss, unspecified ear: Secondary | ICD-10-CM | POA: Diagnosis not present

## 2022-11-03 DIAGNOSIS — I255 Ischemic cardiomyopathy: Secondary | ICD-10-CM | POA: Diagnosis not present

## 2022-11-03 DIAGNOSIS — Z23 Encounter for immunization: Secondary | ICD-10-CM

## 2022-11-03 DIAGNOSIS — K219 Gastro-esophageal reflux disease without esophagitis: Secondary | ICD-10-CM | POA: Diagnosis not present

## 2022-11-03 MED ORDER — CYANOCOBALAMIN 1000 MCG/ML IJ SOLN
1000.0000 ug | Freq: Once | INTRAMUSCULAR | Status: AC
  Administered 2022-11-03: 1000 ug via INTRAMUSCULAR

## 2022-11-03 NOTE — Patient Instructions (Signed)
Eardrum Rupture, Adult  An eardrum rupture is a hole (perforation) in the eardrum. The eardrum is a thin, round tissue inside of the ear that separates the ear canal from the middle ear. The eardrum is also called the tympanic membrane. It transfers sound vibrations through small bones in the middle ear to the hearing nerve in the inner ear. It also protects the middle ear from germs. An eardrum rupture can cause pain and hearing loss. What are the causes? This condition may be caused by: An infection. A sudden injury, such as from: Inserting a thin, sharp object into the ear. A hit to the side of the head, especially by an open hand. Falling onto water or a flat surface. A rapid change in pressure, such as from flying or scuba diving. A sudden increase in pressure against the eardrum, such as from an explosion or a very loud noise. Inserting a cotton-tipped swab in the ear. A long-term eustachian tube disorder. Eustachian tubes are parts of the body that connect each middle ear space to the back of the nose. A medical procedure or surgery, such as a procedure to remove wax from the ear canal. Removing a pressure equalization tube(PE tube) that was surgically placed through the eardrum. Having a PE tube fall out. What increases the risk? You are more likely to develop this condition if: You have had PE tubes inserted in your ears. You have an ear infection. You play sports that: Involve balls or contact with other players. Take place in water, such as diving, scuba diving, or waterskiing. What are the signs or symptoms? Symptoms of this condition include: Sudden pain at the time of the injury. Ear pain that suddenly improves. Ringing in the ear after the injury. Drainage from the ear. The drainage may be clear, cloudy or pus-like, or bloody. Hearing loss. Dizziness. How is this diagnosed? This condition is diagnosed based on your symptoms and medical history as well as a physical  exam. Your health care provider can usually see a perforation using an ear scope (otoscope). You may have tests, such as: A hearing test (audiogram) to check for hearing loss. A test in which a sample of ear drainage is tested for infection (culture). How is this treated? An eardrum typically heals on its own within a few weeks. If your eardrum does not heal, your health care provider may recommend a procedure to place a patch over your eardrum or surgery to repair your eardrum. Your health care provider may also prescribe antibiotic medicines to help prevent infection. If the ear heals completely, any hearing loss should be temporary. Follow these instructions at home: Medicines Take over-the-counter and prescription medicines only as told by your health care provider. If you were prescribed an antibiotic medicine, use it as told by your health care provider. Do not stop using the antibiotic even if you start to feel better. Ear care Keep your ear dry. This is very important. Follow instructions from your health care provider about how to keep your ear dry. You may need to wear waterproof earplugs when bathing and swimming. If directed, apply heat to your affected ear as often as told by your health care provider. Use the heat source that your health care provider recommends, such as a moist heat pack or a heating pad. This will help to relieve pain. Place a towel between your skin and the heat source. Leave the heat on for 20-30 minutes. Remove the heat if your skin turns bright red.   This is especially important if you are unable to feel pain, heat, or cold. You have a greater risk of getting burned. General instructions Return to sports and activities as told by your health care provider. Ask your health care provider what activities are safe for you. Wear headgear with ear protection when you play sports in which ear injuries are common. Talk to your health care provider before traveling by  plane. Keep all follow-up visits. This is important. Contact a health care provider if: You have a fever. You have ear pain. You have mucus or blood draining from your ear. You have hearing loss, dizziness, or ringing in your ear. Get help right away if: You have sudden hearing loss. You are very dizzy. You have severe ear pain. Your face feels weak or becomes limp (paralyzed). These symptoms may represent a serious problem that is an emergency. Do not wait to see if the symptoms will go away. Get medical help right away. Call your local emergency services (911 in the [place] Do not drive yourself to the hospital. Summary An eardrum rupture is a hole (perforation) in the eardrum that can cause pain and hearing loss. It is usually caused by a sudden injury to the ear. The eardrum will likely heal on its own within a few weeks. In some cases, surgery may be necessary. Follow instructions from your health care provider about how to keep your ear dry as it heals. This information is not intended to replace advice given to you by your health care provider. Make sure you discuss any questions you have with your health care provider. Document Revised: 10/05/2020 Document Reviewed: 10/05/2020 Elsevier Patient Education  2023 [employer].  

## 2022-11-04 LAB — COMPREHENSIVE METABOLIC PANEL
ALT: 20 U/L (ref 0–53)
AST: 28 U/L (ref 0–37)
Albumin: 4 g/dL (ref 3.5–5.2)
Alkaline Phosphatase: 65 U/L (ref 39–117)
BUN: 41 mg/dL — ABNORMAL HIGH (ref 6–23)
CO2: 27 mEq/L (ref 19–32)
Calcium: 9.2 mg/dL (ref 8.4–10.5)
Chloride: 108 mEq/L (ref 96–112)
Creatinine, Ser: 1.24 mg/dL (ref 0.40–1.50)
GFR: 49.05 mL/min — ABNORMAL LOW (ref >60.00)
Glucose, Bld: 118 mg/dL — ABNORMAL HIGH (ref 70–99)
Potassium: 4.3 mEq/L (ref 3.5–5.1)
Sodium: 141 mEq/L (ref 135–145)
Total Bilirubin: 0.9 mg/dL (ref 0.2–1.2)
Total Protein: 6.1 g/dL (ref 6.0–8.3)

## 2022-11-04 LAB — CBC WITH DIFFERENTIAL/PLATELET
Basophils Absolute: 0.1 10*3/uL (ref 0.0–0.1)
Basophils Relative: 0.8 % (ref 0.0–3.0)
Eosinophils Absolute: 0.1 10*3/uL (ref 0.0–0.7)
Eosinophils Relative: 2 % (ref 0.0–5.0)
HCT: 33.9 % — ABNORMAL LOW (ref 39.0–52.0)
Hemoglobin: 11.6 g/dL — ABNORMAL LOW (ref 13.0–17.0)
Lymphocytes Relative: 19.6 % (ref 12.0–46.0)
Lymphs Abs: 1.4 10*3/uL (ref 0.7–4.0)
MCHC: 34.2 g/dL (ref 30.0–36.0)
MCV: 100.8 fl — ABNORMAL HIGH (ref 78.0–100.0)
Monocytes Absolute: 0.6 10*3/uL (ref 0.1–1.0)
Monocytes Relative: 8.2 % (ref 3.0–12.0)
Neutro Abs: 5 10*3/uL (ref 1.4–7.7)
Neutrophils Relative %: 69.4 % (ref 43.0–77.0)
Platelets: 160 10*3/uL (ref 150.0–400.0)
RBC: 3.36 Mil/uL — ABNORMAL LOW (ref 4.22–5.81)
RDW: 14.1 % (ref 11.5–15.5)
WBC: 7.2 10*3/uL (ref 4.0–10.5)

## 2022-11-04 LAB — URIC ACID: Uric Acid, Serum: 2.6 mg/dL — ABNORMAL LOW (ref 4.0–7.8)

## 2022-11-04 LAB — LIPID PANEL
Cholesterol: 77 mg/dL (ref 0–200)
HDL: 40.3 mg/dL (ref >39.00)
LDL Cholesterol: 20 mg/dL (ref 0–99)
NonHDL: 36.23
Total CHOL/HDL Ratio: 2
Triglycerides: 83 mg/dL (ref 0.0–149.0)
VLDL: 16.6 mg/dL (ref 0.0–40.0)

## 2022-11-04 LAB — HEMOGLOBIN A1C: Hgb A1c MFr Bld: 5.9 % (ref 4.6–6.5)

## 2022-11-04 LAB — TSH: TSH: 1.17 u[IU]/mL (ref 0.35–5.50)

## 2022-11-05 DIAGNOSIS — E538 Deficiency of other specified B group vitamins: Secondary | ICD-10-CM | POA: Insufficient documentation

## 2022-11-05 DIAGNOSIS — H7292 Unspecified perforation of tympanic membrane, left ear: Secondary | ICD-10-CM | POA: Insufficient documentation

## 2022-11-05 NOTE — Assessment & Plan Note (Signed)
Ith hearing loss, he is continuing the drops given him at the ER. Is referred to ENT for further consideraiton

## 2022-11-07 ENCOUNTER — Other Ambulatory Visit: Payer: Self-pay

## 2022-11-07 DIAGNOSIS — M109 Gout, unspecified: Secondary | ICD-10-CM

## 2022-11-10 DIAGNOSIS — S00412A Abrasion of left ear, initial encounter: Secondary | ICD-10-CM | POA: Diagnosis not present

## 2022-11-16 ENCOUNTER — Encounter: Payer: Self-pay | Admitting: Family Medicine

## 2022-11-18 ENCOUNTER — Telehealth: Payer: Self-pay | Admitting: Gastroenterology

## 2022-11-18 NOTE — Telephone Encounter (Signed)
Inbound call from patient stating that he got the letter in the mail about the [place] office closing. Patient stated that the letter has information cut off and is wanting to know what was missing. I advised patient that the office had closed but if he wanted to continue his care with Dr. Bryan Lemma he did not have to worry about requesting his medical records. Patient stated he understood but still wanted to know what was cut off. I do not see the letter in the letters tab so I was not able to give him the information. Patient stated he would like a call back from the nurse to discuss. Please advise.

## 2022-11-22 ENCOUNTER — Other Ambulatory Visit: Payer: Self-pay | Admitting: Family Medicine

## 2022-11-22 ENCOUNTER — Telehealth: Payer: Self-pay | Admitting: Cardiology

## 2022-11-22 ENCOUNTER — Ambulatory Visit
Admission: RE | Admit: 2022-11-22 | Discharge: 2022-11-22 | Disposition: A | Discharge status: Other Acute Inpatient Hospital | Payer: Medicare Other | Source: Ambulatory Visit | Attending: Urgent Care | Admitting: Urgent Care

## 2022-11-22 ENCOUNTER — Other Ambulatory Visit: Payer: Self-pay | Admitting: Cardiology

## 2022-11-22 VITALS — BP 127/64 | HR 78 | Temp 98.1°F | Resp 16

## 2022-11-22 DIAGNOSIS — R609 Edema, unspecified: Secondary | ICD-10-CM | POA: Diagnosis not present

## 2022-11-22 DIAGNOSIS — I447 Left bundle-branch block, unspecified: Secondary | ICD-10-CM | POA: Diagnosis not present

## 2022-11-22 DIAGNOSIS — R600 Localized edema: Secondary | ICD-10-CM

## 2022-11-22 DIAGNOSIS — I509 Heart failure, unspecified: Secondary | ICD-10-CM | POA: Diagnosis not present

## 2022-11-22 DIAGNOSIS — Z743 Need for continuous supervision: Secondary | ICD-10-CM | POA: Diagnosis not present

## 2022-11-22 DIAGNOSIS — R069 Unspecified abnormalities of breathing: Secondary | ICD-10-CM | POA: Diagnosis not present

## 2022-11-22 DIAGNOSIS — R0609 Other forms of dyspnea: Secondary | ICD-10-CM | POA: Diagnosis not present

## 2022-11-22 MED ORDER — METOPROLOL SUCCINATE ER 25 MG PO TB24: ORAL_TABLET | ORAL | 3 refills | Status: DC

## 2022-11-22 NOTE — ED Triage Notes (Signed)
Patient presents to UC for SOB, fatigue x 2 days. States he had diarrhea that has resolved. Daughter states she is concerned with his fatigue worse with ambulation.   Denies chest pain.

## 2022-11-22 NOTE — Telephone Encounter (Signed)
Spoke with pt and pt's daughter. Pt's daughter stated that she had spoken with her father and they had no further questions.

## 2022-11-22 NOTE — Discharge Instructions (Signed)
You have signs and symptoms concerning for acute congestive heart failure. Edema, JVD, DOE. Please head to the ER for a full workup.

## 2022-11-22 NOTE — ED Provider Notes (Signed)
[facility]    CSN: [id number] Arrival date & time: 11/22/22  1443      History   Chief Complaint Chief Complaint  Patient presents with   Fatigue    Dad had bathroom trouble for two days which has resolved but now trouble catching his breath, fatigued and lower eye swelling. - Entered by patient   Shortness of Breath   Appointment    HPI Daniel Reeves is a 88 y.o. male.   Pleasant 88 year old male presents today due to concerns of fatigue, shortness of breath, and eye swelling over the past 2 days.  He has a known history of a left bundle branch block, and CAD, denies a history of CHF.  He does not take water pills.  Daughter states over the past 2 days he has had significant bilateral lower extremity edema which he does not normally have.  She also states that in the morning, his cheeks and eyes were also swollen, but this has since improved since this morning.  Patient states that walking from the waiting room to the exam room he became very short of breath.  He denies fever or cough.  He reports some back pain that was not responsive to Tylenol or ibuprofen.  He denies any chest pain.  He states he cannot lay down because he gets too short of breath laying flat.  Patient became winded while talking.   Shortness of Breath   Past Medical History:  Diagnosis Date   Abdominal aortic aneurysm ([place])    a. [place] (1/14):  3.3 x 3.4 cm => f/u 11/2013   Amebic dysentery    AMEBIC DYSENTERY 11/19/2007   Qualifier: History of  By: Lenna Gilford MD, Deborra Medina    Anemia 03/07/2017   ANXIETY 11/19/2007   Qualifier: Diagnosis of  By: Lenna Gilford MD, Deborra Medina    Arthritis 03/07/2017   Atherosclerosis of coronary artery bypass graft with unstable angina pectoris ([place]) 04/19/2013   BACK PAIN, LUMBAR 11/16/2007   Qualifier: Diagnosis of  By: Julien Girt CMA, Leigh     Benign prostatic hypertrophy    BENIGN PROSTATIC HYPERTROPHY, HX OF 11/16/2007   Qualifier: Diagnosis of  By: Julien Girt CMA, Leigh      BRBPR (bright red blood per rectum) 11/01/2016   CAD (coronary artery disease)    a. s/p CABG in 1979 and 1993;  b. LHC (5/14):  LM, LAD, CFX and RCA occluded; L-LAD ok, dLAD occluded after insertion of LIMA, S-OM occluded, S-PDA/AM 80-90 => PCI with Promus DES; EF 25%   Cardiomyopathy, ischemic 06/05/2013   Cerumen impaction    Bilateral   Chicken pox as a child   Chronic systolic CHF (congestive heart failure) ([place])    COLONIC POLYPS 07/01/2008   Qualifier: Diagnosis of  By: Lenna Gilford MD, Scott M    Degenerative joint disease    DEGENERATIVE JOINT DISEASE 11/16/2007   Qualifier: Diagnosis of  By: Julien Girt CMA, Leigh     Diverticulosis of colon    DIVERTICULOSIS OF COLON 07/01/2008   Qualifier: Diagnosis of  By: Lenna Gilford MD, Deborra Medina    Double vision 03/07/2017   Essential hypertension 11/16/2007   Qualifier: Diagnosis of  By: Julien Girt CMA, Leigh     Fingernail abnormalities 03/07/2017   FLANK PAIN, RIGHT 02/03/2010   Qualifier: History of  By: Lenna Gilford MD, Deborra Medina    GERD (gastroesophageal reflux disease)    GOUT 11/16/2007   Qualifier: Diagnosis of  By: Julien Girt CMA, Marliss Czar  Hearing loss 11/24/2014   Heart murmur    History of shingles 10/27/2017   Hypercholesterolemia    HYPERCHOLESTEROLEMIA 11/16/2007   Qualifier: Diagnosis of  By: Julien Girt CMA, Leigh     Ischemic cardiomyopathy    a. echo (09/05/13): EF 35%, diffuse HK worsened distal septal, mid/distal inferior and apical region, grade 1 diastolic dysfunction, mild LAE.     Kidney stone 08/17/2011   Loss of hearing    Lumbar back pain    Measles as a child   Medicare annual wellness visit, subsequent 11/24/2014   Sees Dr Delman Cheadle for dermatology Sees Dr Roni Bread of Urology Sees Dr Stanford Breed of cardiology Sees Dr Virginia Rochester of Opthamology No further colonoscopies warranted        Mumps as a child   Nephrolithiasis    Pain in joint, lower leg 07/29/2014   PERIPHERAL VASCULAR DISEASE 11/16/2007   Qualifier: Diagnosis of  By: Julien Girt CMA, Leigh     Peripheral  vascular disease ([place])    Rectal bleeding 03/07/2017   Shingles 07/29/2014   Sun-damaged skin 05/31/2014    Patient Active Problem List   Diagnosis Date Noted   Tympanic membrane perforation, left 11/05/2022   B12 deficiency 11/05/2022   Skin cancer 01/10/2022   Chronic renal disease 03/14/2020   Grief reaction 12/24/2019   Hyperglycemia 01/22/2019   Edema 12/17/2018   Pain of right hip 12/17/2018   Foot pain, right 03/01/2018   Hemorrhoid 10/29/2017   History of shingles 10/27/2017   Anemia 03/07/2017   Medicare annual wellness visit, subsequent 11/24/2014   Hearing loss 11/24/2014   Pain in joint, lower leg 07/29/2014   Sun-damaged skin 05/31/2014   Cardiomyopathy, ischemic 06/05/2013   Kidney stone 08/17/2011   CAD (coronary artery disease)    FLANK PAIN, RIGHT 02/03/2010   Abdominal aortic aneurysm ([place]) 11/04/2009   COLONIC POLYPS 07/01/2008   Anxiety state 11/19/2007   HYPERCHOLESTEROLEMIA 11/16/2007   Gout 11/16/2007   Essential hypertension 11/16/2007   PERIPHERAL VASCULAR DISEASE 11/16/2007   GERD 11/16/2007   Osteoarthritis 11/16/2007   BACK PAIN, LUMBAR 11/16/2007   BPH (benign prostatic hyperplasia) 11/16/2007    Past Surgical History:  Procedure Laterality Date   CORONARY ANGIOPLASTY WITH STENT PLACEMENT  04/18/2013   RCA        CORONARY ARTERY BYPASS GRAFT  1979   x4 SVG-DIAG-LAD, SVG-OM-PDA   CORONARY ARTERY BYPASS GRAFT  1993   Redo x5 by Dr Harlow Asa; Durward Fortes, SVG-OM, SVG-AM-PL   Decompressive laminectomy  01/2006   L2 - scarum by Dr. Shellia Carwin   HEMORRHOID SURGERY     fissure with hemorrhoid corrected at age 36   [facility]   Right by Dr Lonzo Cloud HERNIA REPAIR  1996   Left by Dr. Harlow Asa   LEFT HEART CATHETERIZATION WITH CORONARY ANGIOGRAM N/A 09/23/2013   Procedure: LEFT HEART CATHETERIZATION WITH CORONARY ANGIOGRAM;  Surgeon: Blane Ohara, MD;  Location: [facility] CATH LAB;  Service: Cardiovascular;  Laterality: N/A;    lens implants     for vision correction   PERCUTANEOUS CORONARY STENT INTERVENTION (PCI-S) N/A 04/18/2013   Procedure: PERCUTANEOUS CORONARY STENT INTERVENTION (PCI-S);  Surgeon: Sherren Mocha, MD;  Location: [facility] CATH LAB;  Service: Cardiovascular;  Laterality: N/A;   TONSILLECTOMY         Home Medications    Prior to Admission medications   Medication Sig Start Date End Date Taking? Authorizing Provider  allopurinol (ZYLOPRIM) 300 MG tablet Take 1 tablet by  mouth once daily 06/08/22   [name], MD  aspirin EC 81 MG tablet Take 81 mg by mouth every morning.     [provider]  b complex vitamins capsule Take 1 capsule by mouth daily.    [provider]  busPIRone (BUSPAR) 5 MG tablet Take 1 tablet by mouth twice daily as needed 11/22/22   [name], MD  Cholecalciferol (VITAMIN D-3 PO) Take 5,000 Units by mouth daily with breakfast.    [provider]  famotidine (PEPCID) 20 MG tablet Take 1 tablet (20 mg total) by mouth 2 (two) times daily. 06/14/22   [name], MD  finasteride (PROSCAR) 5 MG tablet Takes every third day    [provider]  folic acid (FOLVITE) 342 MCG tablet Take 400 mcg by mouth 2 (two) times daily.    [provider]  isosorbide mononitrate (IMDUR) 30 MG 24 hr tablet Take 3 tablets by mouth once daily 11/22/22   [name], MD  losartan (COZAAR) 50 MG tablet Take 1 tablet by mouth once daily 02/04/22   Lelon Perla, MD  metoprolol succinate (TOPROL-XL) 25 MG 24 hr tablet Take 1/2 (one-half) tablet by mouth once daily 11/22/22   Lelon Perla, MD  Misc Natural Products (OSTEO BI-FLEX ADV JOINT SHIELD) TABS Take 1 tablet by mouth 2 (two) times daily.    [provider]  Multiple Vitamin (MULTIVITAMIN) tablet Take 1 tablet by mouth daily.    [provider]  nitroGLYCERIN (NITROSTAT) 0.4 MG SL tablet DISSOLVE ONE TABLET UNDER THE TONGUE EVERY 5 MINUTES AS NEEDED FOR CHEST PAIN.   DO NOT EXCEED A TOTAL OF 3 DOSES IN 15 MINUTES 05/22/20   Lelon Perla, MD  simvastatin (ZOCOR) 40 MG tablet Take 1 tablet (40 mg total) by mouth every evening. 10/17/22   [name], MD    Family History Family History  Problem Relation Age of Onset   Parkinsonism Brother    Diabetes Maternal Grandmother    Depression Daughter    Other Son        4 stents   Heart disease Son    Diabetes Son        type 2   Colon cancer Neg Hx    Esophageal cancer Neg Hx    Rectal cancer Neg Hx    Stomach cancer Neg Hx     Social History Social History   Tobacco Use   Smoking status: Former    Types: Cigarettes    Quit date: 11/28/1944    Years since quitting: 78.0   Smokeless tobacco: Never  Vaping Use   Vaping Use: Never used  Substance Use Topics   Alcohol use: Yes    Alcohol/week: 2.0 standard drinks of alcohol    Types: 2 Standard drinks or equivalent per week    Comment: occasional alcohol use   Drug use: No     Allergies   Baclofen, Celecoxib, Lisinopril, Methocarbamol, Other, Pregabalin, Ramipril, and Tramadol   Review of Systems Review of Systems  Respiratory:  Positive for shortness of breath.   As per HPI   Physical Exam Triage Vital Signs ED Triage Vitals [11/22/22 1551]  Enc Vitals Group     BP 127/64     Pulse Rate 78     Resp 16     Temp 98.1 F (36.7 C)     Temp Source Oral     SpO2 99 %  Weight      Height      Head Circumference      Peak Flow      Pain Score 0     Pain Loc      Pain Edu?      Excl. in [place]?    No data found.  Updated Vital Signs BP 127/64 (BP Location: Left Arm)   Pulse 78   Temp 98.1 F (36.7 C) (Oral)   Resp 16   SpO2 99%   Visual Acuity Right Eye Distance:   Left Eye Distance:   Bilateral Distance:    Right Eye Near:   Left Eye Near:    Bilateral Near:     Physical Exam Vitals and nursing note reviewed.  Constitutional:      General: He is in acute distress (mildly short of breath with talking,  difficulty completing long scentences).     Appearance: He is well-developed. He is not ill-appearing or toxic-appearing.  HENT:     Head: Normocephalic.  Neck:     Vascular: Hepatojugular reflux and JVD present.  Cardiovascular:     Rate and Rhythm: Normal rate and regular rhythm.     Heart sounds: Murmur heard.  Pulmonary:     Effort: Accessory muscle usage present.     Breath sounds: Examination of the right-lower field reveals rales. Examination of the left-lower field reveals rales. Decreased breath sounds and rales (SCANT) present.  Chest:     Chest wall: No mass, tenderness or edema.  Musculoskeletal:     Right lower leg: Edema present.     Left lower leg: Edema present.  Skin:    General: Skin is warm and dry.  Neurological:     Mental Status: He is alert.      UC Treatments / Results  Labs (all labs ordered are listed, but only abnormal results are displayed) Labs Reviewed - No data to display  EKG   Radiology No results found.  Procedures ED EKG  Date/Time: 11/22/2022 3:59 PM  Performed by: Chaney Malling, PA Authorized by: Chaney Malling, PA   Previous ECG:    Previous ECG:  Compared to current   Similarity:  No change Interpretation:    Interpretation: abnormal   Rate:    ECG rate:  72   ECG rate assessment: normal   QRS:    QRS conduction: LBBB    (including critical care time)  Medications Ordered in UC Medications - No data to display  Initial Impression / Assessment and Plan / UC Course  I have reviewed the triage vital signs and the nursing notes.  Pertinent labs & imaging results that were available during my care of the patient were reviewed by me and considered in my medical decision making (see chart for details).     DOE with B extremity edema -highly suggestive of acute congestive heart failure.  Patient with no history of the same.  EKG appears comparable to prior EKGs.  EMS was contacted to transport patient to the  emergency room, despite normal vital signs patient appears very dyspneic and difficulty completing full sentences.  Consider nonrebreather mask.  IV was placed in office. Pt transported to ER via EMS.  Final Clinical Impressions(s) / UC Diagnoses   Final diagnoses:  Dyspnea on exertion  Bilateral lower extremity edema     Discharge Instructions      You have signs and symptoms concerning for acute congestive heart failure. Edema, JVD, DOE. Please head  to the ER for a full workup.     ED Prescriptions   None    PDMP not reviewed this encounter.   Chaney Malling, [place] 11/22/22 1708

## 2022-11-22 NOTE — Telephone Encounter (Signed)
*  STAT* If patient is at the pharmacy, call can be transferred to refill team.   1. Which medications need to be refilled? (please list name of each medication and dose if known)  metoprolol succinate (TOPROL-XL) 25 MG 24 hr tablet     2. Which pharmacy/location (including street and city if local pharmacy) is medication to be sent to? [place]   3. Do they need a 30 day or 90 day supply? 90 day  Patient is out of medication

## 2022-11-22 NOTE — ED Notes (Signed)
Patient is being discharged from the Urgent Care and sent to the Emergency Department via EMS . Per [place], [place], patient is in need of higher level of care due to acute CHF. Patient is aware and verbalizes understanding of plan of care.  Vitals:   11/22/22 1551  BP: 127/64  Pulse: 78  Resp: 16  Temp: 98.1 F (36.7 C)  SpO2: 99%

## 2022-11-23 ENCOUNTER — Encounter: Payer: Self-pay | Admitting: Cardiology

## 2022-11-23 ENCOUNTER — Encounter: Payer: Self-pay | Admitting: Family Medicine

## 2022-11-24 NOTE — Telephone Encounter (Signed)
Spoke with patient's pharmacist. She stated patient did pick up Toprol '25mg'$  to cut in half. Verified with patient that he has the medication.

## 2022-11-29 ENCOUNTER — Ambulatory Visit: Payer: Medicare Other | Attending: Physician Assistant | Admitting: Nurse Practitioner

## 2022-11-29 ENCOUNTER — Other Ambulatory Visit: Payer: Self-pay

## 2022-11-29 ENCOUNTER — Encounter: Payer: Self-pay | Admitting: Nurse Practitioner

## 2022-11-29 VITALS — BP 124/60 | HR 64 | Ht 70.0 in | Wt 147.8 lb

## 2022-11-29 DIAGNOSIS — I255 Ischemic cardiomyopathy: Secondary | ICD-10-CM | POA: Insufficient documentation

## 2022-11-29 DIAGNOSIS — I2510 Atherosclerotic heart disease of native coronary artery without angina pectoris: Secondary | ICD-10-CM

## 2022-11-29 DIAGNOSIS — I714 Abdominal aortic aneurysm, without rupture, unspecified: Secondary | ICD-10-CM

## 2022-11-29 DIAGNOSIS — I10 Essential (primary) hypertension: Secondary | ICD-10-CM | POA: Insufficient documentation

## 2022-11-29 DIAGNOSIS — E785 Hyperlipidemia, unspecified: Secondary | ICD-10-CM

## 2022-11-29 DIAGNOSIS — I5022 Chronic systolic (congestive) heart failure: Secondary | ICD-10-CM | POA: Diagnosis not present

## 2022-11-29 NOTE — Progress Notes (Signed)
Office Visit    Patient Name: Daniel Reeves Date of Encounter: 11/29/2022  Primary Care Provider:  [name], MD Primary Cardiologist:  Kirk Ruths, MD  Chief Complaint    89 year old male with a history of CAD, ICM, chronic systolic heart failure, AAA, hypertension, and hyperlipidemia who presents for follow-up related to CAD and heart failure.  Past Medical History    Past Medical History:  Diagnosis Date   Abdominal aortic aneurysm ([place])    a. [place] (1/14):  3.3 x 3.4 cm => f/u 11/2013   Amebic dysentery    AMEBIC DYSENTERY 11/19/2007   Qualifier: History of  By: Lenna Gilford MD, Deborra Medina    Anemia 03/07/2017   ANXIETY 11/19/2007   Qualifier: Diagnosis of  By: Lenna Gilford MD, Deborra Medina    Arthritis 03/07/2017   Atherosclerosis of coronary artery bypass graft with unstable angina pectoris ([place]) 04/19/2013   BACK PAIN, LUMBAR 11/16/2007   Qualifier: Diagnosis of  By: Julien Girt CMA, Leigh     Benign prostatic hypertrophy    BENIGN PROSTATIC HYPERTROPHY, HX OF 11/16/2007   Qualifier: Diagnosis of  By: Julien Girt CMA, Leigh     BRBPR (bright red blood per rectum) 11/01/2016   CAD (coronary artery disease)    a. s/p CABG in 1979 and 1993;  b. LHC (5/14):  LM, LAD, CFX and RCA occluded; L-LAD ok, dLAD occluded after insertion of LIMA, S-OM occluded, S-PDA/AM 80-90 => PCI with Promus DES; EF 25%   Cardiomyopathy, ischemic 06/05/2013   Cerumen impaction    Bilateral   Chicken pox as a child   Chronic systolic CHF (congestive heart failure) ([place])    COLONIC POLYPS 07/01/2008   Qualifier: Diagnosis of  By: Lenna Gilford MD, Scott M    Degenerative joint disease    DEGENERATIVE JOINT DISEASE 11/16/2007   Qualifier: Diagnosis of  By: Julien Girt CMA, Leigh     Diverticulosis of colon    DIVERTICULOSIS OF COLON 07/01/2008   Qualifier: Diagnosis of  By: Lenna Gilford MD, Deborra Medina    Double vision 03/07/2017   Essential hypertension 11/16/2007   Qualifier: Diagnosis of  By: Julien Girt CMA, Leigh     Fingernail abnormalities  03/07/2017   FLANK PAIN, RIGHT 02/03/2010   Qualifier: History of  By: Lenna Gilford MD, Deborra Medina    GERD (gastroesophageal reflux disease)    GOUT 11/16/2007   Qualifier: Diagnosis of  By: Julien Girt CMA, Leigh     Hearing loss 11/24/2014   Heart murmur    History of shingles 10/27/2017   Hypercholesterolemia    HYPERCHOLESTEROLEMIA 11/16/2007   Qualifier: Diagnosis of  By: Julien Girt CMA, Leigh     Ischemic cardiomyopathy    a. echo (09/05/13): EF 35%, diffuse HK worsened distal septal, mid/distal inferior and apical region, grade 1 diastolic dysfunction, mild LAE.     Kidney stone 08/17/2011   Loss of hearing    Lumbar back pain    Measles as a child   Medicare annual wellness visit, subsequent 11/24/2014   Sees Dr Delman Cheadle for dermatology Sees Dr Roni Bread of Urology Sees Dr Stanford Breed of cardiology Sees Dr Virginia Rochester of Opthamology No further colonoscopies warranted        Mumps as a child   Nephrolithiasis    Pain in joint, lower leg 07/29/2014   PERIPHERAL VASCULAR DISEASE 11/16/2007   Qualifier: Diagnosis of  By: Julien Girt CMA, Leigh     Peripheral vascular disease ([place])    Rectal bleeding 03/07/2017   Shingles 07/29/2014  Sun-damaged skin 05/31/2014   Past Surgical History:  Procedure Laterality Date   CORONARY ANGIOPLASTY WITH STENT PLACEMENT  04/18/2013   RCA        CORONARY ARTERY BYPASS GRAFT  1979   x4 SVG-DIAG-LAD, SVG-OM-PDA   CORONARY ARTERY BYPASS GRAFT  1993   Redo x5 by Dr Harlow Asa; Durward Fortes, SVG-OM, SVG-AM-PL   Decompressive laminectomy  01/2006   L2 - scarum by Dr. Shellia Carwin   HEMORRHOID SURGERY     fissure with hemorrhoid corrected at age 5   [place]   Right by Dr Lonzo Cloud HERNIA REPAIR  1996   Left by Dr. Harlow Asa   LEFT HEART CATHETERIZATION WITH CORONARY ANGIOGRAM N/A 09/23/2013   Procedure: LEFT HEART CATHETERIZATION WITH CORONARY ANGIOGRAM;  Surgeon: Blane Ohara, MD;  Location: [facility] C[facility] LAB;  Service: Cardiovascular;  Laterality: N/A;   lens implants      for vision correction   PERCUTANEOUS CORONARY STENT INTERVENTION (PCI-S) N/A 04/18/2013   Procedure: PERCUTANEOUS CORONARY STENT INTERVENTION (PCI-S);  Surgeon: Sherren Mocha, MD;  Location: [facility] CATH LAB;  Service: Cardiovascular;  Laterality: N/A;   TONSILLECTOMY      Allergies  Allergies  Allergen Reactions   Baclofen Other (See Comments)    Altered mental status   Celecoxib Other (See Comments)    Altered mental status   Lisinopril     REACTION: dizziness   Methocarbamol     REACTION: pt states "dizzy"   Other    Pregabalin     REACTION: pt states "dizzy"   Ramipril     REACTION: hives and dizziness   Tramadol Other (See Comments)    Altered mental status    History of Present Illness    89 year old male with the above past medical history including CAD, ICM, chronic systolic heart failure, AAA, hypertension, and hyperlipidemia.  He has a history of CAD s/p CABG in 1979 in 1993.  Cardiac catheterization in May 2014 showed occluded left main, LAD, circumflex, and RCA with patent LIMA-LAD and distal LAD occluded after insertion, occluded SVG-OM, high-grade lesion prior to insertion into the PDA of the SVG-acute marginal and PDA, s/p DES-S-RCA.  Repeat cardiac catheterization in October 2014 in the setting of recurrent chest pain showed patent stent-SVG-PDA with mild ISR.  Medical therapy was recommended.  Nuclear study in 12/2015 showed EF 35%, inferolateral scar, no ischemia.  Carotid Dopplers in February 2018 showed less than 50% bilateral stenosis.  Most recent echocardiogram in 02/2021 showed EF 25 to 30%, trace aortic insufficiency.  Abdominal ultrasound in September 2022 showed a 4 cm abdominal aortic aneurysm.  Given advanced age and patient's preference to not pursue any major procedures/surgical repair, no further imaging was recommended.  He was last seen in the office on 08/10/2022 and was stable overall from a cardiac standpoint.  He denied symptoms concerning for angina.   He presented to the urgent care on 11/22/2022 with complaints of fatigue, shortness of breath, bilateral lower extremity edema and orthopnea.  He was transferred to the ED ([facility]).  BNP was elevated at 17,027.  Troponin was mildly elevated flat trend.  Chest x-ray showed patchy bibasilar infiltrates/atelectasis and effusions.  EKG was without significant change.  He received IV Lasix.  He did have a brief run of VT while awaiting labs.  Patient declined further hospital treatment and was discharged home on oral Lasix.  He presents today for follow-up. Since his last visit he has been stable from a  cardiac standpoint.  He has noted improvement in his breathing and lower extremity edema with initiation of Lasix.  He still has some nonpitting bilateral lower extremity edema.  He states he has only been taking his Lasix for 2 days.  He denies any chest pain, dyspnea, PND, orthopnea, weight gain.  Overall, he reports feeling well.  Home Medications    Current Outpatient Medications  Medication Sig Dispense Refill   aspirin EC 81 MG tablet Take 81 mg by mouth every morning.      b complex vitamins capsule Take 1 capsule by mouth daily.     busPIRone (BUSPAR) 5 MG tablet Take 1 tablet by mouth twice daily as needed 60 tablet 0   Cholecalciferol (VITAMIN D-3 PO) Take 5,000 Units by mouth daily with breakfast.     famotidine (PEPCID) 20 MG tablet Take 1 tablet (20 mg total) by mouth 2 (two) times daily. 180 tablet 1   finasteride (PROSCAR) 5 MG tablet Takes every third day     folic acid (FOLVITE) 948 MCG tablet Take 400 mcg by mouth 2 (two) times daily.     furosemide (LASIX) 20 MG tablet 20 mg 2 (two) times daily.     isosorbide mononitrate (IMDUR) 30 MG 24 hr tablet Take 3 tablets by mouth once daily 270 tablet 0   losartan (COZAAR) 50 MG tablet Take 1 tablet by mouth once daily 90 tablet 3   metoprolol succinate (TOPROL-XL) 25 MG 24 hr tablet Take 1/2 (one-half) tablet by mouth once daily 45 tablet 3    Misc Natural Products (OSTEO BI-FLEX ADV JOINT SHIELD) TABS Take 1 tablet by mouth 2 (two) times daily.     Multiple Vitamin (MULTIVITAMIN) tablet Take 1 tablet by mouth daily.     nitroGLYCERIN (NITROSTAT) 0.4 MG SL tablet DISSOLVE ONE TABLET UNDER THE TONGUE EVERY 5 MINUTES AS NEEDED FOR CHEST PAIN.  DO NOT EXCEED A TOTAL OF 3 DOSES IN 15 MINUTES 25 tablet 2   potassium chloride (KLOR-CON) 10 MEQ tablet Take 10 mEq by mouth 2 (two) times daily.     simvastatin (ZOCOR) 40 MG tablet Take 1 tablet (40 mg total) by mouth every evening. 90 tablet 0   allopurinol (ZYLOPRIM) 300 MG tablet Take 1 tablet by mouth once daily (Patient not taking: Reported on 11/29/2022) 90 tablet 0   No current facility-administered medications for this visit.     Review of Systems    He denies chest pain, palpitations, dyspnea, pnd, orthopnea, n, v, dizziness, syncope, edema, weight gain, or early satiety. All other systems reviewed and are otherwise negative except as noted above.   Physical Exam    VS:  BP 124/60 (BP Location: Left Arm, Patient Position: Sitting, Cuff Size: Normal)   Pulse 64   Ht '5\' 10"'$  (1.778 m)   Wt 147 lb 12.8 oz (67 kg)   SpO2 97%   BMI 21.21 kg/m   GEN: Well nourished, well developed, in no acute distress. HEENT: normal. Neck: Supple, no JVD, carotid bruits, or masses. Cardiac: RRR, no murmurs, rubs, or gallops. No clubbing, cyanosis, edema.  Radials/DP/PT 2+ and equal bilaterally.  Respiratory:  Respirations regular and unlabored, clear to auscultation bilaterally. GI: Soft, nontender, nondistended, BS + x 4. MS: no deformity or atrophy. Skin: warm and dry, no rash. Neuro:  Strength and sensation are intact. Psych: Normal affect.  Accessory Clinical Findings    ECG personally reviewed by me today -NSR, 64 bpm, LBBB- no acute changes.  Lab Results  Component Value Date   WBC 7.2 11/03/2022   HGB 11.6 (L) 11/03/2022   HCT 33.9 (L) 11/03/2022   MCV 100.8 (H) 11/03/2022    PLT 160.0 11/03/2022   Lab Results  Component Value Date   CREATININE 1.24 11/03/2022   BUN 41 (H) 11/03/2022   NA 141 11/03/2022   K 4.3 11/03/2022   CL 108 11/03/2022   CO2 27 11/03/2022   Lab Results  Component Value Date   ALT 20 11/03/2022   AST 28 11/03/2022   ALKPHOS 65 11/03/2022   BILITOT 0.9 11/03/2022   Lab Results  Component Value Date   CHOL 77 11/03/2022   HDL 40.30 11/03/2022   LDLCALC 20 11/03/2022   TRIG 83.0 11/03/2022   CHOLHDL 2 11/03/2022    Lab Results  Component Value Date   HGBA1C 5.9 11/03/2022    Assessment & Plan    1. ICM/chronic systolic heart failure: Most recent echo in 02/2021 showed EF 25 to 30%, trace aortic insufficiency.  Recent ED visit in the setting of CHF exacerbation.  He was started on Lasix with improvement in his symptoms.  We did have a long conversation today about ongoing medical management.  Patient wishes to avoid any prolonged hospitalizations or invasive procedures. We discussed possible palliative care consult for further discussion of goals of care.  Patient expressed interest in palliative care consult, however, he would like to discuss this with his daughter prior to proceeding.  Discussed self-monitoring with daily weights. Through shared decision making will defer further escalation of GDMT at this time.  Will check BMET in 2 weeks.  Continue metoprolol, losartan, furosemide.  2. CAD: S/p CABG in 1979 in 1993, s/p DES-RCA in 2014. Stable with no anginal symptoms. No indication for ischemic evaluation.   3. AAA: No further imaging indicated at this time given advanced age, patient preference to avoid any invasive procedures.    4. Hypertension: BP well controlled. Continue current antihypertensive regimen.   5. Hyperlipidemia: LDL was 20 in 10/2022.  Continue aspirin, simvastatin.  6. Disposition: Follow-up as scheduled with Dr. Stanford Breed in 01/2023.     Lenna Sciara, NP 11/29/2022, 1:03 PM

## 2022-11-29 NOTE — Patient Instructions (Addendum)
Medication Instructions:  Your physician recommends that you continue on your current medications as directed. Please refer to the Current Medication list given to you today.   *If you need a refill on your cardiac medications before your next appointment, please call your pharmacy*   Lab Work: Your physician recommends that you return for lab work in 2 weeks. BMET  If you have labs (blood work) drawn today and your tests are completely normal, you will receive your results only by: [place] (if you have MyChart) OR A paper copy in the mail If you have any lab test that is abnormal or we need to change your treatment, we will call you to review the results.   Testing/Procedures: NONE ordered at this time of appointment    Follow-Up: At [facility], you and your health needs are our priority.  As part of our continuing mission to provide you with exceptional heart care, we have created designated Provider Care Teams.  These Care Teams include your primary Cardiologist (physician) and Advanced Practice Providers (APPs -  Physician Assistants and Nurse Practitioners) who all work together to provide you with the care you need, when you need it.  We recommend signing up for the patient portal called "MyChart".  Sign up information is provided on this After Visit Summary.  MyChart is used to connect with patients for Virtual Visits (Telemedicine).  Patients are able to view lab/test results, encounter notes, upcoming appointments, etc.  Non-urgent messages can be sent to your provider as well.   To learn more about what you can do with MyChart, go to [email or web address].    Your next appointment:    Keep follow up 01/2023  The format for your next appointment:   In Person  Provider:   Kirk Ruths, MD     Other Instructions Discussed palliative care with pt for goals of care. ([facility] or [facility])  Important Information About  [place]

## 2022-12-03 ENCOUNTER — Other Ambulatory Visit: Payer: Self-pay | Admitting: Family Medicine

## 2022-12-03 DIAGNOSIS — E538 Deficiency of other specified B group vitamins: Secondary | ICD-10-CM

## 2022-12-03 DIAGNOSIS — D649 Anemia, unspecified: Secondary | ICD-10-CM

## 2022-12-03 DIAGNOSIS — I10 Essential (primary) hypertension: Secondary | ICD-10-CM

## 2022-12-05 NOTE — Telephone Encounter (Signed)
Called pt daughter and appt made

## 2022-12-06 ENCOUNTER — Other Ambulatory Visit (INDEPENDENT_AMBULATORY_CARE_PROVIDER_SITE_OTHER): Payer: Medicare Other

## 2022-12-06 ENCOUNTER — Ambulatory Visit (INDEPENDENT_AMBULATORY_CARE_PROVIDER_SITE_OTHER): Payer: Medicare Other | Admitting: *Deleted

## 2022-12-06 DIAGNOSIS — E538 Deficiency of other specified B group vitamins: Secondary | ICD-10-CM

## 2022-12-06 DIAGNOSIS — D649 Anemia, unspecified: Secondary | ICD-10-CM

## 2022-12-06 DIAGNOSIS — I10 Essential (primary) hypertension: Secondary | ICD-10-CM | POA: Diagnosis not present

## 2022-12-06 LAB — VITAMIN B12: Vitamin B-12: 583 pg/mL (ref 211–911)

## 2022-12-06 LAB — CBC WITH DIFFERENTIAL/PLATELET
Basophils Absolute: 0.1 10*3/uL (ref 0.0–0.1)
Basophils Relative: 0.7 % (ref 0.0–3.0)
Eosinophils Absolute: 0.2 10*3/uL (ref 0.0–0.7)
Eosinophils Relative: 2.3 % (ref 0.0–5.0)
HCT: 38 % — ABNORMAL LOW (ref 39.0–52.0)
Hemoglobin: 12.4 g/dL — ABNORMAL LOW (ref 13.0–17.0)
Lymphocytes Relative: 17.1 % (ref 12.0–46.0)
Lymphs Abs: 1.3 10*3/uL (ref 0.7–4.0)
MCHC: 32.7 g/dL (ref 30.0–36.0)
MCV: 99.2 fl (ref 78.0–100.0)
Monocytes Absolute: 0.7 10*3/uL (ref 0.1–1.0)
Monocytes Relative: 9.4 % (ref 3.0–12.0)
Neutro Abs: 5.5 10*3/uL (ref 1.4–7.7)
Neutrophils Relative %: 70.5 % (ref 43.0–77.0)
Platelets: 175 10*3/uL (ref 150.0–400.0)
RBC: 3.83 Mil/uL — ABNORMAL LOW (ref 4.22–5.81)
RDW: 13.9 % (ref 11.5–15.5)
WBC: 7.8 10*3/uL (ref 4.0–10.5)

## 2022-12-06 LAB — COMPREHENSIVE METABOLIC PANEL
ALT: 20 U/L (ref 0–53)
AST: 25 U/L (ref 0–37)
Albumin: 4 g/dL (ref 3.5–5.2)
Alkaline Phosphatase: 59 U/L (ref 39–117)
BUN: 37 mg/dL — ABNORMAL HIGH (ref 6–23)
CO2: 27 mEq/L (ref 19–32)
Calcium: 9.3 mg/dL (ref 8.4–10.5)
Chloride: 101 mEq/L (ref 96–112)
Creatinine, Ser: 1.46 mg/dL (ref 0.40–1.50)
GFR: 40.3 mL/min — ABNORMAL LOW (ref >60.00)
Glucose, Bld: 94 mg/dL (ref 70–99)
Potassium: 4.5 mEq/L (ref 3.5–5.1)
Sodium: 138 mEq/L (ref 135–145)
Total Bilirubin: 0.9 mg/dL (ref 0.2–1.2)
Total Protein: 6.3 g/dL (ref 6.0–8.3)

## 2022-12-06 MED ORDER — CYANOCOBALAMIN 1000 MCG/ML IJ SOLN
1000.0000 ug | Freq: Once | INTRAMUSCULAR | Status: AC
  Administered 2022-12-06: 1000 ug via INTRAMUSCULAR

## 2022-12-06 NOTE — Progress Notes (Signed)
Patient here for b12 injection per physicians order.  Injection given in left deltoid and patient tolerated well.

## 2022-12-13 DIAGNOSIS — I5022 Chronic systolic (congestive) heart failure: Secondary | ICD-10-CM | POA: Diagnosis not present

## 2022-12-13 DIAGNOSIS — I714 Abdominal aortic aneurysm, without rupture, unspecified: Secondary | ICD-10-CM | POA: Diagnosis not present

## 2022-12-13 DIAGNOSIS — E785 Hyperlipidemia, unspecified: Secondary | ICD-10-CM | POA: Diagnosis not present

## 2022-12-13 DIAGNOSIS — I255 Ischemic cardiomyopathy: Secondary | ICD-10-CM | POA: Diagnosis not present

## 2022-12-13 DIAGNOSIS — I2510 Atherosclerotic heart disease of native coronary artery without angina pectoris: Secondary | ICD-10-CM | POA: Diagnosis not present

## 2022-12-13 DIAGNOSIS — I10 Essential (primary) hypertension: Secondary | ICD-10-CM | POA: Diagnosis not present

## 2022-12-13 LAB — BASIC METABOLIC PANEL
BUN/Creatinine Ratio: 29 — ABNORMAL HIGH (ref 10–24)
BUN: 44 mg/dL — ABNORMAL HIGH (ref 10–36)
CO2: 23 mmol/L (ref 20–29)
Calcium: 9.7 mg/dL (ref 8.6–10.2)
Chloride: 103 mmol/L (ref 96–106)
Creatinine, Ser: 1.51 mg/dL — ABNORMAL HIGH (ref 0.76–1.27)
Glucose: 95 mg/dL (ref 70–99)
Potassium: 5 mmol/L (ref 3.5–5.2)
Sodium: 140 mmol/L (ref 134–144)
eGFR: 42 mL/min/{1.73_m2} — ABNORMAL LOW (ref >59)

## 2022-12-14 ENCOUNTER — Telehealth: Payer: Self-pay

## 2022-12-14 ENCOUNTER — Ambulatory Visit: Payer: Medicare Other | Admitting: Physician Assistant

## 2022-12-14 NOTE — Telephone Encounter (Signed)
Lmom, waiting on a return call to discuss lab results.

## 2022-12-20 ENCOUNTER — Other Ambulatory Visit: Payer: Self-pay

## 2022-12-20 ENCOUNTER — Telehealth: Payer: Self-pay | Admitting: Cardiology

## 2022-12-20 DIAGNOSIS — Z79899 Other long term (current) drug therapy: Secondary | ICD-10-CM

## 2022-12-20 DIAGNOSIS — N289 Disorder of kidney and ureter, unspecified: Secondary | ICD-10-CM

## 2022-12-20 NOTE — Telephone Encounter (Signed)
Spoke with pt. Pt is aware of his lab results and recommendations. Pt will stay hydrated, continue his current mediations and repeat lab work in 1 month as discussed.

## 2022-12-20 NOTE — Telephone Encounter (Signed)
Patient was returning call. Ask that call back after 4. Please advise

## 2022-12-23 ENCOUNTER — Other Ambulatory Visit: Payer: Self-pay | Admitting: Cardiology

## 2022-12-23 MED ORDER — FUROSEMIDE 20 MG PO TABS: 20.0000 mg | ORAL_TABLET | Freq: Two times a day (BID) | ORAL | 1 refills | Status: DC

## 2022-12-23 NOTE — Telephone Encounter (Signed)
Patient stated he needed refill on lasix '20mg'$  twice daily. Refill sent to his pharmacy. Explained he needs lab work in one month. He wrote down the date Feb 15 or 16.

## 2022-12-23 NOTE — Telephone Encounter (Signed)
*  STAT* If patient is at the pharmacy, call can be transferred to refill team.   1. Which medications need to be refilled? (please list name of each medication and dose if known)  furosemide (LASIX) 20 MG tablet   (takes it twice a day)   2. Which pharmacy/location (including street and city if local pharmacy) is medication to be sent to? [place]   3. Do they need a 30 day or 90 day supply? 90 day   Out of medication.

## 2022-12-23 NOTE — Telephone Encounter (Signed)
Patient returning call.

## 2022-12-23 NOTE — Addendum Note (Signed)
Addended by: Betha Loa F on: 12/23/2022 10:45 AM   Modules accepted: Orders

## 2022-12-23 NOTE — Addendum Note (Signed)
Addended by: Betha Loa F on: 12/23/2022 10:46 AM   Modules accepted: Orders

## 2022-12-23 NOTE — Telephone Encounter (Signed)
LMTCB regarding medication refill.

## 2022-12-26 ENCOUNTER — Encounter: Payer: Self-pay | Admitting: Family Medicine

## 2023-01-03 ENCOUNTER — Other Ambulatory Visit: Payer: Self-pay

## 2023-01-03 ENCOUNTER — Encounter: Payer: Self-pay | Admitting: Cardiology

## 2023-01-03 MED ORDER — POTASSIUM CHLORIDE ER 10 MEQ PO TBCR: 10.0000 meq | EXTENDED_RELEASE_TABLET | Freq: Two times a day (BID) | ORAL | 3 refills | Status: DC

## 2023-01-03 NOTE — Progress Notes (Signed)
Prescription sent to pharmacy.

## 2023-01-04 ENCOUNTER — Telehealth: Payer: Self-pay | Admitting: Family Medicine

## 2023-01-04 NOTE — Telephone Encounter (Signed)
Copied from [employer]. Topic: Medicare AWV >> Jan 04, 2023  3:37 PM Devoria Glassing wrote: Reason for CRM: Left message for patient to schedule Annual Wellness Visit(AWV).  Please schedule with Health Nurse Advisor at [facility]. Please call [phone] ask for [facility].

## 2023-01-09 ENCOUNTER — Other Ambulatory Visit (HOSPITAL_BASED_OUTPATIENT_CLINIC_OR_DEPARTMENT_OTHER): Payer: Self-pay | Admitting: Internal Medicine

## 2023-01-09 DIAGNOSIS — I714 Abdominal aortic aneurysm, without rupture, unspecified: Secondary | ICD-10-CM

## 2023-01-10 ENCOUNTER — Ambulatory Visit (INDEPENDENT_AMBULATORY_CARE_PROVIDER_SITE_OTHER): Payer: Medicare Other | Admitting: *Deleted

## 2023-01-10 DIAGNOSIS — E538 Deficiency of other specified B group vitamins: Secondary | ICD-10-CM | POA: Diagnosis not present

## 2023-01-10 MED ORDER — CYANOCOBALAMIN 1000 MCG/ML IJ SOLN
1000.0000 ug | Freq: Once | INTRAMUSCULAR | Status: AC
  Administered 2023-01-10: 1000 ug via INTRAMUSCULAR

## 2023-01-10 NOTE — Progress Notes (Signed)
Pt here for monthly B12 injection per Dr. Charlett Blake.  B12 104mg given IM, and pt tolerated injection well.  Next B12 injection scheduled for 02/20/23 at next visit with pcp.

## 2023-01-12 ENCOUNTER — Other Ambulatory Visit (HOSPITAL_BASED_OUTPATIENT_CLINIC_OR_DEPARTMENT_OTHER): Payer: Self-pay | Admitting: Internal Medicine

## 2023-01-12 ENCOUNTER — Ambulatory Visit (HOSPITAL_BASED_OUTPATIENT_CLINIC_OR_DEPARTMENT_OTHER)
Admission: RE | Admit: 2023-01-12 | Discharge: 2023-01-12 | Disposition: A | Discharge status: Home / Self Care | Payer: Medicare Other | Source: Ambulatory Visit | Attending: Internal Medicine | Admitting: Internal Medicine

## 2023-01-12 DIAGNOSIS — Z136 Encounter for screening for cardiovascular disorders: Secondary | ICD-10-CM | POA: Diagnosis not present

## 2023-01-12 DIAGNOSIS — I714 Abdominal aortic aneurysm, without rupture, unspecified: Secondary | ICD-10-CM | POA: Diagnosis not present

## 2023-01-18 ENCOUNTER — Other Ambulatory Visit: Payer: Self-pay | Admitting: Family Medicine

## 2023-01-18 DIAGNOSIS — I25700 Atherosclerosis of coronary artery bypass graft(s), unspecified, with unstable angina pectoris: Secondary | ICD-10-CM

## 2023-01-18 DIAGNOSIS — E7800 Pure hypercholesterolemia, unspecified: Secondary | ICD-10-CM

## 2023-01-18 DIAGNOSIS — I10 Essential (primary) hypertension: Secondary | ICD-10-CM

## 2023-01-20 DIAGNOSIS — N289 Disorder of kidney and ureter, unspecified: Secondary | ICD-10-CM | POA: Diagnosis not present

## 2023-01-20 DIAGNOSIS — Z79899 Other long term (current) drug therapy: Secondary | ICD-10-CM | POA: Diagnosis not present

## 2023-01-20 LAB — BASIC METABOLIC PANEL
BUN/Creatinine Ratio: 28 — ABNORMAL HIGH (ref 10–24)
BUN: 48 mg/dL — ABNORMAL HIGH (ref 10–36)
CO2: 22 mmol/L (ref 20–29)
Calcium: 9.8 mg/dL (ref 8.6–10.2)
Chloride: 103 mmol/L (ref 96–106)
Creatinine, Ser: 1.73 mg/dL — ABNORMAL HIGH (ref 0.76–1.27)
Glucose: 89 mg/dL (ref 70–99)
Potassium: 5.1 mmol/L (ref 3.5–5.2)
Sodium: 139 mmol/L (ref 134–144)
eGFR: 36 mL/min/{1.73_m2} — ABNORMAL LOW (ref >59)

## 2023-01-24 ENCOUNTER — Telehealth: Payer: Self-pay

## 2023-01-24 NOTE — Telephone Encounter (Signed)
Lmom to discuss lab results and recommendations. Waiting on a return call.

## 2023-01-24 NOTE — Telephone Encounter (Signed)
Spoke with pt. Pt was notified of lab results. Pt will continue his current medication.

## 2023-01-24 NOTE — Telephone Encounter (Signed)
Patient is returning call.  °

## 2023-01-25 DIAGNOSIS — L814 Other melanin hyperpigmentation: Secondary | ICD-10-CM | POA: Diagnosis not present

## 2023-01-25 DIAGNOSIS — C44311 Basal cell carcinoma of skin of nose: Secondary | ICD-10-CM | POA: Diagnosis not present

## 2023-01-25 DIAGNOSIS — L570 Actinic keratosis: Secondary | ICD-10-CM | POA: Diagnosis not present

## 2023-01-25 DIAGNOSIS — Z85828 Personal history of other malignant neoplasm of skin: Secondary | ICD-10-CM | POA: Diagnosis not present

## 2023-01-25 DIAGNOSIS — D1801 Hemangioma of skin and subcutaneous tissue: Secondary | ICD-10-CM | POA: Diagnosis not present

## 2023-02-06 ENCOUNTER — Telehealth: Payer: Self-pay

## 2023-02-06 ENCOUNTER — Other Ambulatory Visit: Payer: Self-pay

## 2023-02-06 NOTE — Telephone Encounter (Signed)
Initial Comment Caller states he has a medication list and this is third time he asked for one of the drugs to be removed from the list. He also states he is talking about the rx Allopurinol. He states the medication is dangerous. He currently has no symptoms. He recived a call from his doctor's office on Dec 12th 2023 and got notification to remove that medication from the list. On Jan 9th he told the office that the medication is still on the top of the list. Translation No Disp. Time Eilene Ghazi Time) Disposition Final User 02/04/2023 12:10:41 PM Attempt made - message left [place] 02/04/2023 12:25:27 PM Attempt made - message left [place] 02/04/2023 12:42:39 PM FINAL ATTEMPT MADE - message left Yes Astrid Divine RN, Varney Biles Final Disposition 02/04/2023 12:42:39 PM FINAL ATTEMPT MADE - message left Yes Astrid Divine, RN, Varney Biles

## 2023-02-06 NOTE — Telephone Encounter (Signed)
Medication removed.

## 2023-02-07 NOTE — Progress Notes (Signed)
HPI: FU CAD; s/p CABG in 1979 and 1993, ischemic CM, systolic CHF, AAA, HTN, HL. Patient underwent cardiac catheterization in May of 2014. The left main, LAD, circumflex and RCA were occluded. The LIMA to the LAD was patent and the distal LAD was occluded after the insertion. Saphenous vein graft to the obtuse marginal was occluded. Saphenous vein graft to the acute marginal and PDA had a high-grade lesion prior to insertion into the PDA of 80-90%. Ejection fraction was 25%. PCI: Promus Premier (3.5x12 mm) DES to the [facility]. Repeat catheterization in October 2014 because of recurrent chest pain. The stent placed in the saphenous vein graft to the PDA had mild in-stent restenosis. Medical therapy recommended. Nuclear study 2/17 showed EF 35, inferolateral scar, no ischemia. Carotid Dopplers February 2018 showed less than 50% bilateral stenosis. Most recent echocardiogram April 2022 showed ejection fraction 25 to 30%, trace aortic insufficiency.  Seen in the emergency room December 2023 with CHF.  Treated with IV Lasix.  Abdominal ultrasound February 2024 showed 4.7 cm abdominal aortic aneurysm.  Since he was last seen, he denies dyspnea, chest pain, palpitations or syncope.  However he has developed worsening pedal edema as his Lasix was recently decreased due to worsening renal function.  Current Outpatient Medications  Medication Sig Dispense Refill   aspirin EC 81 MG tablet Take 81 mg by mouth every morning.      b complex vitamins capsule Take 1 capsule by mouth daily.     busPIRone (BUSPAR) 5 MG tablet Take 1 tablet by mouth twice daily as needed 60 tablet 0   Cholecalciferol (VITAMIN D-3 PO) Take 5,000 Units by mouth daily with breakfast.     famotidine (PEPCID) 20 MG tablet Take 1 tablet (20 mg total) by mouth 2 (two) times daily. 180 tablet 1   finasteride (PROSCAR) 5 MG tablet Takes every third day     folic acid (FOLVITE) A999333 MCG tablet Take 400 mcg by mouth 2 (two) times daily.      furosemide (LASIX) 20 MG tablet Take 1 tablet (20 mg total) by mouth 2 (two) times daily. 60 tablet 1   isosorbide mononitrate (IMDUR) 30 MG 24 hr tablet Take 3 tablets by mouth once daily 270 tablet 0   losartan (COZAAR) 50 MG tablet Take 1 tablet by mouth once daily 90 tablet 3   metoprolol succinate (TOPROL-XL) 25 MG 24 hr tablet Take 1/2 (one-half) tablet by mouth once daily 45 tablet 3   Misc Natural Products (OSTEO BI-FLEX ADV JOINT SHIELD) TABS Take 1 tablet by mouth 2 (two) times daily.     Multiple Vitamin (MULTIVITAMIN) tablet Take 1 tablet by mouth daily.     nitroGLYCERIN (NITROSTAT) 0.4 MG SL tablet DISSOLVE ONE TABLET UNDER THE TONGUE EVERY 5 MINUTES AS NEEDED FOR CHEST PAIN.  DO NOT EXCEED A TOTAL OF 3 DOSES IN 15 MINUTES 25 tablet 2   potassium chloride (KLOR-CON) 10 MEQ tablet Take 1 tablet (10 mEq total) by mouth 2 (two) times daily. 180 tablet 3   simvastatin (ZOCOR) 40 MG tablet TAKE 1 TABLET BY MOUTH ONCE DAILY IN THE EVENING 90 tablet 1   No current facility-administered medications for this visit.     Past Medical History:  Diagnosis Date   Abdominal aortic aneurysm ([place])    a. [place] (1/14):  3.3 x 3.4 cm => f/u 11/2013   Amebic dysentery    AMEBIC DYSENTERY 11/19/2007   Qualifier: History of  By: Lenna Gilford MD,  Scott M    Anemia 03/07/2017   ANXIETY 11/19/2007   Qualifier: Diagnosis of  By: Lenna Gilford MD, Deborra Medina    Arthritis 03/07/2017   Atherosclerosis of coronary artery bypass graft with unstable angina pectoris ([place]) 04/19/2013   BACK PAIN, LUMBAR 11/16/2007   Qualifier: Diagnosis of  By: Julien Girt CMA, Leigh     Benign prostatic hypertrophy    BENIGN PROSTATIC HYPERTROPHY, HX OF 11/16/2007   Qualifier: Diagnosis of  By: Julien Girt CMA, Leigh     BRBPR (bright red blood per rectum) 11/01/2016   CAD (coronary artery disease)    a. s/p CABG in 1979 and 1993;  b. LHC (5/14):  LM, LAD, CFX and RCA occluded; L-LAD ok, dLAD occluded after insertion of LIMA, S-OM occluded, S-PDA/AM 80-90  => PCI with Promus DES; EF 25%   Cardiomyopathy, ischemic 06/05/2013   Cerumen impaction    Bilateral   Chicken pox as a child   Chronic systolic CHF (congestive heart failure) ([place])    COLONIC POLYPS 07/01/2008   Qualifier: Diagnosis of  By: Lenna Gilford MD, Scott M    Degenerative joint disease    DEGENERATIVE JOINT DISEASE 11/16/2007   Qualifier: Diagnosis of  By: Julien Girt CMA, Leigh     Diverticulosis of colon    DIVERTICULOSIS OF COLON 07/01/2008   Qualifier: Diagnosis of  By: Lenna Gilford MD, Deborra Medina    Double vision 03/07/2017   Essential hypertension 11/16/2007   Qualifier: Diagnosis of  By: Julien Girt CMA, Leigh     Fingernail abnormalities 03/07/2017   FLANK PAIN, RIGHT 02/03/2010   Qualifier: History of  By: Lenna Gilford MD, Deborra Medina    GERD (gastroesophageal reflux disease)    GOUT 11/16/2007   Qualifier: Diagnosis of  By: Julien Girt CMA, Leigh     Hearing loss 11/24/2014   Heart murmur    History of shingles 10/27/2017   Hypercholesterolemia    HYPERCHOLESTEROLEMIA 11/16/2007   Qualifier: Diagnosis of  By: Julien Girt CMA, Leigh     Ischemic cardiomyopathy    a. echo (09/05/13): EF 35%, diffuse HK worsened distal septal, mid/distal inferior and apical region, grade 1 diastolic dysfunction, mild LAE.     Kidney stone 08/17/2011   Loss of hearing    Lumbar back pain    Measles as a child   Medicare annual wellness visit, subsequent 11/24/2014   Sees Dr Delman Cheadle for dermatology Sees Dr Roni Bread of Urology Sees Dr Stanford Breed of cardiology Sees Dr Virginia Rochester of Opthamology No further colonoscopies warranted        Mumps as a child   Nephrolithiasis    Pain in joint, lower leg 07/29/2014   PERIPHERAL VASCULAR DISEASE 11/16/2007   Qualifier: Diagnosis of  By: Julien Girt CMA, Leigh     Peripheral vascular disease ([place])    Rectal bleeding 03/07/2017   Shingles 07/29/2014   Sun-damaged skin 05/31/2014    Past Surgical History:  Procedure Laterality Date   CORONARY ANGIOPLASTY WITH STENT PLACEMENT  04/18/2013   RCA        CORONARY  ARTERY BYPASS GRAFT  1979   x4 SVG-DIAG-LAD, SVG-OM-PDA   CORONARY ARTERY BYPASS GRAFT  1993   Redo x5 by Dr Harlow Asa; Durward Fortes, SVG-OM, SVG-AM-PL   Decompressive laminectomy  01/2006   L2 - scarum by Dr. Shellia Carwin   HEMORRHOID SURGERY     fissure with hemorrhoid corrected at age 82   [place]   Right by Dr Lonzo Cloud HERNIA REPAIR  1996   Left  by Dr. Harlow Asa   LEFT HEART CATHETERIZATION WITH CORONARY ANGIOGRAM N/A 09/23/2013   Procedure: LEFT HEART CATHETERIZATION WITH CORONARY ANGIOGRAM;  Surgeon: Blane Ohara, MD;  Location: [facility] CATH LAB;  Service: Cardiovascular;  Laterality: N/A;   lens implants     for vision correction   PERCUTANEOUS CORONARY STENT INTERVENTION (PCI-S) N/A 04/18/2013   Procedure: PERCUTANEOUS CORONARY STENT INTERVENTION (PCI-S);  Surgeon: Sherren Mocha, MD;  Location: [facility] CATH LAB;  Service: Cardiovascular;  Laterality: N/A;   TONSILLECTOMY      Social History   Socioeconomic History   Marital status: Widowed    Spouse name: [name] x 72 years   Number of children: 7   Years of education: Not on file   Highest education level: Not on file  Occupational History   Occupation: Retired - Former Editor, 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   Other Topics Concern   Not on file  Social History Narrative   Married   7 children   Social Determinants of Health   Financial Resource Strain: Low Risk  (12/03/2021)   Overall Financial Resource Strain (CARDIA)    Difficulty of Paying  Living Expenses: Not hard at all  Food Insecurity: No Food Insecurity (12/03/2021)   Hunger Vital Sign    Worried About Running Out of Food in the Last Year: Never true    Ran Out of Food in the Last Year: Never true  Transportation Needs: No Transportation Needs (12/03/2021)   PRAPARE - Hydrologist (Medical): No    Lack of Transportation (Non-Medical): No  Physical Activity: Inactive (12/03/2021)   Exercise Vital Sign    Days of Exercise per Week: 0 days    Minutes of Exercise per Session: 0 min  Stress: No Stress Concern Present (12/03/2021)   [place]    Feeling of Stress : Not at all  Social Connections: Moderately Isolated (12/03/2021)   Social Connection and Isolation Panel [NHANES]    Frequency of Communication with Friends and Family: More than three times a week    Frequency of Social Gatherings with Friends and Family: More than three times a week    Attends Religious Services: 1 to 4 times per year    Active Member of [employer] or Organizations: No    Attends Archivist Meetings: Never    Marital Status: Widowed  Intimate Partner Violence: Not At Risk (12/03/2021)   Humiliation, Afraid, Rape, and Kick questionnaire    Fear of Current or Ex-Partner: No    Emotionally Abused: No    Physically Abused: No    Sexually Abused: No    Family History  Problem Relation Age of Onset   Parkinsonism Brother    Diabetes Maternal Grandmother    Depression Daughter    Other Son        4 stents   Heart disease Son  Diabetes Son        type 2   Colon cancer Neg Hx    Esophageal cancer Neg Hx    Rectal cancer Neg Hx    Stomach cancer Neg Hx     ROS: no fevers or chills, productive cough, hemoptysis, dysphasia, odynophagia, melena, hematochezia, dysuria, hematuria, rash, seizure activity, orthopnea, PND, pedal edema, claudication. Remaining systems are negative.  Physical  Exam: Well-developed well-nourished in no acute distress.  Skin is warm and dry.  HEENT is normal.  Neck is supple.  Chest is clear to auscultation with normal expansion.  Cardiovascular exam is regular rate and rhythm.  Abdominal exam nontender or distended. No masses palpated. Extremities show no edema. neuro grossly intact   A/P  1 coronary artery disease-patient is not having chest pain.  Continue medical therapy with aspirin and statin.  2 ischemic cardiomyopathy-discontinue losartan and treat with Entresto 24/26 twice daily.  Continue beta-blocker.  Add Jardiance 10 mg daily.  Check potassium and renal function today and in 1 week.  Will consider spironolactone in the future if blood pressure and renal function allow.  Note I am discontinuing isosorbide as well.  3 hypertension-patient's blood pressure is controlled.  Medication adjustments for cardiomyopathy and CHF as outlined above.  4 hyperlipidemia-continue statin.  5 abdominal aortic aneurysm-noted on follow-up ultrasound.  However he would not be a good candidate for repair as he is nearing 89 years of age.  We will therefore not pursue further imaging.  He is in agreement.  6 history of palpitations-continue beta-blocker.  7 chronic renal insufficiency-BUN recently 48 and creatinine 1.73.  At that time his Lasix was decreased.  Will recheck renal function today.  I will see him back in 4 to 6 weeks to make sure that he is stable.  Kirk Ruths, MD

## 2023-02-08 ENCOUNTER — Encounter: Payer: Self-pay | Admitting: Cardiology

## 2023-02-13 ENCOUNTER — Other Ambulatory Visit: Payer: Self-pay | Admitting: Nurse Practitioner

## 2023-02-15 ENCOUNTER — Ambulatory Visit: Payer: Medicare Other | Attending: Cardiology | Admitting: Cardiology

## 2023-02-15 ENCOUNTER — Encounter: Payer: Self-pay | Admitting: Cardiology

## 2023-02-15 VITALS — BP 102/58 | HR 52 | Ht 70.0 in | Wt 149.4 lb

## 2023-02-15 DIAGNOSIS — I714 Abdominal aortic aneurysm, without rupture, unspecified: Secondary | ICD-10-CM | POA: Diagnosis not present

## 2023-02-15 DIAGNOSIS — N289 Disorder of kidney and ureter, unspecified: Secondary | ICD-10-CM | POA: Insufficient documentation

## 2023-02-15 DIAGNOSIS — I10 Essential (primary) hypertension: Secondary | ICD-10-CM | POA: Diagnosis not present

## 2023-02-15 DIAGNOSIS — I5022 Chronic systolic (congestive) heart failure: Secondary | ICD-10-CM | POA: Diagnosis not present

## 2023-02-15 DIAGNOSIS — I255 Ischemic cardiomyopathy: Secondary | ICD-10-CM | POA: Insufficient documentation

## 2023-02-15 DIAGNOSIS — E785 Hyperlipidemia, unspecified: Secondary | ICD-10-CM | POA: Diagnosis not present

## 2023-02-15 DIAGNOSIS — I2510 Atherosclerotic heart disease of native coronary artery without angina pectoris: Secondary | ICD-10-CM | POA: Insufficient documentation

## 2023-02-15 MED ORDER — EMPAGLIFLOZIN 10 MG PO TABS: 10.0000 mg | ORAL_TABLET | Freq: Every day | ORAL | 11 refills | Status: DC

## 2023-02-15 MED ORDER — ISOSORBIDE MONONITRATE ER 30 MG PO TB24: 30.0000 mg | ORAL_TABLET | Freq: Every day | ORAL | 3 refills | Status: DC

## 2023-02-15 MED ORDER — SACUBITRIL-VALSARTAN 24-26 MG PO TABS: 1.0000 | ORAL_TABLET | Freq: Two times a day (BID) | ORAL | 11 refills | Status: DC

## 2023-02-15 MED ORDER — LOSARTAN POTASSIUM 50 MG PO TABS: 50.0000 mg | ORAL_TABLET | Freq: Every day | ORAL | 3 refills | Status: DC

## 2023-02-15 NOTE — Patient Instructions (Signed)
Medication Instructions:   CHANGES BASED ON LAB WORK FROM TODAY  *If you need a refill on your cardiac medications before your next appointment, please call your pharmacy*   Follow-Up: At [facility], you and your health needs are our priority.  As part of our continuing mission to provide you with exceptional heart care, we have created designated Provider Care Teams.  These Care Teams include your primary Cardiologist (physician) and Advanced Practice Providers (APPs -  Physician Assistants and Nurse Practitioners) who all work together to provide you with the care you need, when you need it.  We recommend signing up for the patient portal called "MyChart".  Sign up information is provided on this After Visit Summary.  MyChart is used to connect with patients for Virtual Visits (Telemedicine).  Patients are able to view lab/test results, encounter notes, upcoming appointments, etc.  Non-urgent messages can be sent to your provider as well.   To learn more about what you can do with MyChart, go to [email or web address].    Your next appointment:   4 week(s)  Provider:   Kirk Ruths, MD

## 2023-02-16 ENCOUNTER — Telehealth: Payer: Self-pay | Admitting: *Deleted

## 2023-02-16 LAB — BASIC METABOLIC PANEL
BUN/Creatinine Ratio: 25 — ABNORMAL HIGH (ref 10–24)
BUN: 34 mg/dL (ref 10–36)
CO2: 19 mmol/L — ABNORMAL LOW (ref 20–29)
Calcium: 9.2 mg/dL (ref 8.6–10.2)
Chloride: 107 mmol/L — ABNORMAL HIGH (ref 96–106)
Creatinine, Ser: 1.35 mg/dL — ABNORMAL HIGH (ref 0.76–1.27)
Glucose: 115 mg/dL — ABNORMAL HIGH (ref 70–99)
Potassium: 4.8 mmol/L (ref 3.5–5.2)
Sodium: 140 mmol/L (ref 134–144)
eGFR: 48 mL/min/{1.73_m2} — ABNORMAL LOW (ref >59)

## 2023-02-16 NOTE — Telephone Encounter (Signed)
Left message for pt to call   Per dr Stanford Breed, the patient needs to stop losartan and isosorbide. He will then staert entresto 24/26 mg twice daily and jardiance 10 mg once daily. He will need lab work in one week after the change and the patient has the paperwork for that lab work and he has a savings card for [employer]. The prescriptions were sent to the pharmacy yesterday.

## 2023-02-16 NOTE — Telephone Encounter (Signed)
Spoke with daughter and she is aware of provider recommendations  Per dr Stanford Breed, the patient needs to stop losartan and isosorbide. He will then staert entresto 24/26 mg twice daily and jardiance 10 mg once daily. He will need lab work in one week after the change and the patient has the paperwork for that lab work and he has a savings card for [employer]. The prescriptions were sent to the pharmacy yesterday.    She verbalized understandin.

## 2023-02-16 NOTE — Telephone Encounter (Signed)
  Pt's daughter returning call, she call her at [phone]

## 2023-02-16 NOTE — Telephone Encounter (Signed)
-----   Message from Lelon Perla, MD sent at 02/16/2023  7:23 AM EDT ----- Proceed with plan as outlined in office note Kirk Ruths

## 2023-02-17 NOTE — Progress Notes (Signed)
Subjective:   By signing my name below, I, Daniel Reeves, attest that this documentation has been prepared under the direction and in the presence of Daniel Canary, MD.  02/20/2023.   Patient ID: Daniel Reeves, male    DOB: [birth year], 89 y.o.   MRN: [id number]  Chief Complaint  Patient presents with   Follow-up    Follow up and B-12    HPI Patient is in today for an office visit. He is accompanied by his daughter.  Leg swelling:  His daughter notes that he has struggled with some LE swelling lately. He is unable to get compression socks on and off. Of note, his isosorbide and losartan were discontinued so that he could start Entresto.  Gout:  He states that his gout has subsided with no further issues at this time.  B12 deficiency:  He receives monthly B12 injections. This was administered today.  Diet:  His daughter assists with his diet and helps make sure he has adequate protein intake. He is also drinking a Boost shake routinely.  Exercise:  He has been doing well staying active. He will mow the lawn with a riding mower.    Past Medical History:  Diagnosis Date   Abdominal aortic aneurysm (HCC)    a. 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 s/p CABG in 1979 and 1993;  b. LHC (5/14):  LM, LAD, CFX and RCA occluded; L-LAD ok, dLAD occluded after insertion of LIMA,  S-OM occluded, S-PDA/AM 80-90 => PCI with Promus DES; EF 25%   Cardiomyopathy, ischemic 06/05/2013   Cerumen impaction    Bilateral   Chicken pox as a child   Chronic systolic CHF (congestive heart failure) (HCC)    COLONIC POLYPS 07/01/2008   Qualifier: Diagnosis of  By: Kriste Basque MD, Scott M    Degenerative joint disease    DEGENERATIVE JOINT DISEASE 11/16/2007   Qualifier: Diagnosis of  By: Renaldo Fiddler CMA, Leigh     Diverticulosis of colon    DIVERTICULOSIS OF COLON 07/01/2008   Qualifier: Diagnosis of  By: Kriste Basque MD, Lonzo Cloud    Double vision 03/07/2017   Essential hypertension 11/16/2007   Qualifier: Diagnosis of  By: Renaldo Fiddler CMA, Leigh     Fingernail abnormalities 03/07/2017   FLANK PAIN, RIGHT 02/03/2010   Qualifier: History of  By: Kriste Basque MD, Lonzo Cloud    GERD (gastroesophageal reflux disease)    GOUT 11/16/2007   Qualifier: Diagnosis of  By: Renaldo Fiddler CMA, Leigh     Hearing loss 11/24/2014   Heart murmur    History of shingles 10/27/2017   Hypercholesterolemia    HYPERCHOLESTEROLEMIA 11/16/2007   Qualifier: Diagnosis of  By: Renaldo Fiddler CMA, Leigh     Ischemic cardiomyopathy    a. echo (09/05/13):  EF 35%, diffuse HK worsened distal septal, mid/distal inferior and apical region, grade 1 diastolic dysfunction, mild LAE.     Kidney stone 08/17/2011   Loss of hearing    Lumbar back pain    Measles as a child   Medicare annual wellness visit, subsequent 11/24/2014   Sees Dr Emily Filbert for dermatology Sees Dr Wilson Singer of Urology Sees Dr Jens Som of cardiology Sees Dr Alecia Lemming of Opthamology No further colonoscopies warranted        Mumps as a child   Nephrolithiasis    Pain in joint, lower leg 07/29/2014   PERIPHERAL VASCULAR DISEASE 11/16/2007   Qualifier: Diagnosis of  By: Renaldo Fiddler CMA, Leigh     Peripheral vascular disease (HCC)    Rectal bleeding 03/07/2017   Shingles 07/29/2014   Sun-damaged skin 05/31/2014    Past Surgical History:  Procedure Laterality Date   CORONARY ANGIOPLASTY WITH STENT PLACEMENT  04/18/2013    RCA        CORONARY ARTERY BYPASS GRAFT  1979   x4 SVG-DIAG-LAD, SVG-OM-PDA   CORONARY ARTERY BYPASS GRAFT  1993   Redo x5 by Dr Gerrit Friends; Pollyann Glen, SVG-OM, SVG-AM-PL   Decompressive laminectomy  01/2006   L2 - scarum by Dr. Simonne Come   HEMORRHOID SURGERY     fissure with hemorrhoid corrected at age 24   INGUINAL HERNIA REPAIR  1994   Right by Dr Philippa Chester HERNIA REPAIR  1996   Left by Dr. Gerrit Friends   LEFT HEART CATHETERIZATION WITH CORONARY ANGIOGRAM N/A 09/23/2013   Procedure: LEFT HEART CATHETERIZATION WITH CORONARY ANGIOGRAM;  Surgeon: Micheline Chapman, MD;  Location: [facility] CATH LAB;  Service: Cardiovascular;  Laterality: N/A;   lens implants     for vision correction   PERCUTANEOUS CORONARY STENT INTERVENTION (PCI-S) N/A 04/18/2013   Procedure: PERCUTANEOUS CORONARY STENT INTERVENTION (PCI-S);  Surgeon: [name], MD;  Location: [facility] CATH LAB;  Service: Cardiovascular;  Laterality: N/A;   TONSILLECTOMY      Family History  Problem Relation Age of Onset   Parkinsonism Brother    Diabetes Maternal Grandmother    Depression Daughter    Other Son        4 stents   Heart disease Son    Diabetes Son        type 2   Colon cancer Neg Hx    Esophageal cancer Neg Hx    Rectal cancer Neg Hx    Stomach cancer Neg Hx     Social History   Socioeconomic History   Marital status: Widowed    Spouse name: Daniel Reeves x 72 years   Number of children: 7   Years of education: Not on file   Highest education level: Not on file  Occupational History   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   Other Topics Concern   Not on file  Social History Narrative   Married  7 children   Social Determinants of Health   Financial Resource Strain: Low Risk  (12/03/2021)   Overall Financial Resource Strain (CARDIA)    Difficulty of Paying Living Expenses: Not hard at all  Food Insecurity: No Food Insecurity (12/03/2021)   Hunger Vital Sign    Worried About Running Out of Food in the Last Year: Never true    Ran Out of Food in the Last Year: Never true  Transportation Needs: No Transportation Needs (12/03/2021)   PRAPARE - Administrator, Civil Service (Medical): No    Lack of Transportation (Non-Medical): No  Physical Activity: Inactive (12/03/2021)   Exercise Vital Sign    Days of Exercise per Week: 0 days    Minutes of Exercise per Session: 0 min  Stress: No Stress Concern Present (12/03/2021)   [employer] of Occupational Health - Occupational Stress Questionnaire    Feeling of Stress : Not at all  Social Connections: Moderately Isolated (12/03/2021)   Social Connection and Isolation Panel [NHANES]    Frequency of Communication with Friends and Family: More than three times a week    Frequency of Social Gatherings with Friends and Family: More than three times a week    Attends Religious Services: 1 to 4 times per year    Active Member of [employer] or Organizations: No    Attends Banker Meetings: Never    Marital Status: Widowed  Intimate Partner Violence: Not At Risk (12/03/2021)   Humiliation, Afraid, Rape, and Kick questionnaire    Fear of Current or Ex-Partner: No    Emotionally Abused: No    Physically Abused: No    Sexually Abused: No    Outpatient Medications Prior to Visit  Medication Sig Dispense Refill   aspirin EC 81 MG tablet Take 81 mg by mouth every morning.      b complex vitamins capsule Take 1 capsule by mouth daily.     busPIRone (BUSPAR) 5  MG tablet Take 1 tablet by mouth twice daily as needed 60 tablet 0   Cholecalciferol (VITAMIN D-3 PO) Take 5,000 Units by mouth daily with breakfast.     empagliflozin (JARDIANCE) 10 MG TABS tablet Take 1 tablet (10 mg total) by mouth daily before breakfast. 30 tablet 11   famotidine (PEPCID) 20 MG tablet Take 1 tablet (20 mg total) by mouth 2 (two) times daily. 180 tablet 1   finasteride (PROSCAR) 5 MG tablet Takes every third day     folic acid (FOLVITE) 400 MCG tablet Take 400 mcg by mouth 2 (two) times daily.     furosemide (LASIX) 20 MG tablet Take 1 tablet by mouth twice daily 180 tablet 3   metoprolol succinate (TOPROL-XL) 25 MG 24 hr tablet Take 1/2 (one-half) tablet by mouth once daily 45 tablet 3   Misc Natural Products (OSTEO BI-FLEX ADV JOINT SHIELD) TABS Take 1 tablet by mouth 2 (two) times daily.     Multiple Vitamin (MULTIVITAMIN) tablet Take 1 tablet by mouth daily.     nitroGLYCERIN (NITROSTAT) 0.4 MG SL tablet DISSOLVE ONE TABLET UNDER THE TONGUE EVERY 5 MINUTES AS NEEDED FOR CHEST PAIN.  DO NOT EXCEED A TOTAL OF 3 DOSES IN 15 MINUTES 25 tablet 2   potassium chloride (KLOR-CON) 10 MEQ tablet Take 1 tablet (10 mEq total) by mouth 2 (two) times daily. 180 tablet 3   sacubitril-valsartan (ENTRESTO) 24-26 MG Take 1 tablet by mouth 2 (two) times daily. 60 tablet 11   simvastatin (  ZOCOR) 40 MG tablet TAKE 1 TABLET BY MOUTH ONCE DAILY IN THE EVENING 90 tablet 1   No facility-administered medications prior to visit.    Allergies  Allergen Reactions   Baclofen Other (See Comments)    Altered mental status  Other Reaction(s): Delirium   Celecoxib Other (See Comments)    Altered mental status   Lisinopril Other (See Comments) and Rash    REACTION: dizziness   Methocarbamol Rash    REACTION: pt states "dizzy"  Other Reaction(s): Dizziness   Other     Other Reaction(s): Other   Pregabalin Rash    REACTION: pt states "dizzy"  Other Reaction(s): Dizziness   Ramipril Hives and  Rash    REACTION: hives and dizziness  Other Reaction(s): Dizziness   Tramadol Other (See Comments)    Altered mental status  Other Reaction(s): Delirium   Allopurinol Other (See Comments)    Review of Systems  Cardiovascular:  Positive for leg swelling.  See HPI.     Objective:    Physical Exam Constitutional:      General: He is not in acute distress.    Appearance: Normal appearance. He is not ill-appearing.  HENT:     Head: Normocephalic and atraumatic.     Right Ear: Tympanic membrane, ear canal and external ear normal.     Left Ear: Tympanic membrane, ear canal and external ear normal.  Eyes:     Extraocular Movements: Extraocular movements intact.     Pupils: Pupils are equal, round, and reactive to light.  Cardiovascular:     Rate and Rhythm: Normal rate and regular rhythm.     Heart sounds: Normal heart sounds. No murmur heard.    No gallop.  Pulmonary:     Effort: Pulmonary effort is normal. No respiratory distress.     Breath sounds: Normal breath sounds. No wheezing or rales.  Musculoskeletal:     Right lower leg: 1+ Edema present.     Left lower leg: 1+ Edema present.  Skin:    General: Skin is warm and dry.  Neurological:     General: No focal deficit present.     Mental Status: He is alert and oriented to person, place, and time.  Psychiatric:        Mood and Affect: Mood normal.        Behavior: Behavior normal.     BP 100/62 (BP Location: Right Arm, Patient Position: Sitting, Cuff Size: Normal)   Pulse 65   Temp (!) 97.5 F (36.4 C) (Oral)   Resp 16   Ht 5\' 10"  (1.778 m)   Wt 150 lb 3.2 oz (68.1 kg)   SpO2 94%   BMI 21.55 kg/m  Wt Readings from Last 3 Encounters:  02/20/23 150 lb 3.2 oz (68.1 kg)  02/15/23 149 lb 6.4 oz (67.8 kg)  11/29/22 147 lb 12.8 oz (67 kg)    Diabetic Foot Exam - Simple   No data filed    Lab Results  Component Value Date   WBC 7.8 12/06/2022   HGB 12.4 (L) 12/06/2022   HCT 38.0 (L) 12/06/2022   PLT  175.0 12/06/2022   GLUCOSE 115 (H) 02/15/2023   CHOL 77 11/03/2022   TRIG 83.0 11/03/2022   HDL 40.30 11/03/2022   LDLCALC 20 11/03/2022   ALT 20 12/06/2022   AST 25 12/06/2022   NA 140 02/15/2023   K 4.8 02/15/2023   CL 107 (H) 02/15/2023   CREATININE 1.35 (H) 02/15/2023  BUN 34 02/15/2023   CO2 19 (L) 02/15/2023   TSH 1.17 11/03/2022   PSA 25.40 12/08/2021   INR 1.09 02/04/2018   HGBA1C 5.9 11/03/2022    Lab Results  Component Value Date   TSH 1.17 11/03/2022   Lab Results  Component Value Date   WBC 7.8 12/06/2022   HGB 12.4 (L) 12/06/2022   HCT 38.0 (L) 12/06/2022   MCV 99.2 12/06/2022   PLT 175.0 12/06/2022   Lab Results  Component Value Date   NA 140 02/15/2023   K 4.8 02/15/2023   CO2 19 (L) 02/15/2023   GLUCOSE 115 (H) 02/15/2023   BUN 34 02/15/2023   CREATININE 1.35 (H) 02/15/2023   BILITOT 0.9 12/06/2022   ALKPHOS 59 12/06/2022   AST 25 12/06/2022   ALT 20 12/06/2022   PROT 6.3 12/06/2022   ALBUMIN 4.0 12/06/2022   CALCIUM 9.2 02/15/2023   ANIONGAP 7 11/30/2019   EGFR 48 (L) 02/15/2023   GFR 40.30 (L) 12/06/2022   Lab Results  Component Value Date   CHOL 77 11/03/2022   Lab Results  Component Value Date   HDL 40.30 11/03/2022   Lab Results  Component Value Date   LDLCALC 20 11/03/2022   Lab Results  Component Value Date   TRIG 83.0 11/03/2022   Lab Results  Component Value Date   CHOLHDL 2 11/03/2022   Lab Results  Component Value Date   HGBA1C 5.9 11/03/2022       Assessment & Plan:   Problem List Items Addressed This Visit     Anxiety state    Using Buspar prn      B12 deficiency - Primary    Supplement and monitor       Relevant Orders   Vitamin B12   Chronic renal disease    Hydrate and monitor       Edema    Has flared slightly this week. Cardiology did stop his ARB and start him on Jardiance and Entresto last week so if edema continues to worsen he will discuss with them. Elevate feet above heart, stay  active and use compression hose when out for long periods of time.       Essential hypertension    Well controlled, no changes to meds. Encouraged heart healthy diet such as the DASH diet and exercise as tolerated.        Relevant Orders   CBC with Differential/Platelet   Comprehensive metabolic panel   TSH   Gout    Hydrate and monitor       Relevant Orders   Uric acid   HYPERCHOLESTEROLEMIA    Encourage heart healthy diet such as MIND or DASH diet, increase exercise, avoid trans fats, simple carbohydrates and processed foods, consider a krill or fish or flaxseed oil cap daily. Tolerating statin.        Relevant Orders   Lipid panel   Hyperglycemia    hgba1c acceptable, minimize simple carbs. Increase exercise as tolerated.       Relevant Orders   Hemoglobin A1c     Meds ordered this encounter  Medications   cyanocobalamin (VITAMIN B12) injection 1,000 mcg    I, Danise Edge, MD, personally preformed the services described in this documentation.  All medical record entries made by the scribe were at my direction and in my presence.  I have reviewed the chart and discharge instructions (if applicable) and agree that the record reflects my personal performance and is accurate and complete.  02/20/2023.  I,Mathew Stumpf,acting as a Neurosurgeon for Danise Edge, MD.,have documented all relevant documentation on the behalf of Danise Edge, MD,as directed by  Danise Edge, MD while in the presence of Danise Edge, MD.   Danise Edge, MD

## 2023-02-19 NOTE — Assessment & Plan Note (Signed)
Hydrate and monitor 

## 2023-02-19 NOTE — Assessment & Plan Note (Signed)
Supplement and monitor 

## 2023-02-19 NOTE — Assessment & Plan Note (Signed)
Well controlled, no changes to meds. Encouraged heart healthy diet such as the DASH diet and exercise as tolerated.  °

## 2023-02-19 NOTE — Assessment & Plan Note (Signed)
Encourage heart healthy diet such as MIND or DASH diet, increase exercise, avoid trans fats, simple carbohydrates and processed foods, consider a krill or fish or flaxseed oil cap daily. Tolerating statin 

## 2023-02-19 NOTE — Assessment & Plan Note (Signed)
Using Buspar prn

## 2023-02-19 NOTE — Assessment & Plan Note (Signed)
hgba1c acceptable, minimize simple carbs. Increase exercise as tolerated.  

## 2023-02-20 ENCOUNTER — Ambulatory Visit (INDEPENDENT_AMBULATORY_CARE_PROVIDER_SITE_OTHER): Payer: Medicare Other | Admitting: Family Medicine

## 2023-02-20 VITALS — BP 100/62 | HR 65 | Temp 97.5°F | Resp 16 | Ht 70.0 in | Wt 150.2 lb

## 2023-02-20 DIAGNOSIS — M109 Gout, unspecified: Secondary | ICD-10-CM | POA: Diagnosis not present

## 2023-02-20 DIAGNOSIS — F411 Generalized anxiety disorder: Secondary | ICD-10-CM | POA: Diagnosis not present

## 2023-02-20 DIAGNOSIS — E7800 Pure hypercholesterolemia, unspecified: Secondary | ICD-10-CM | POA: Diagnosis not present

## 2023-02-20 DIAGNOSIS — R739 Hyperglycemia, unspecified: Secondary | ICD-10-CM | POA: Diagnosis not present

## 2023-02-20 DIAGNOSIS — I10 Essential (primary) hypertension: Secondary | ICD-10-CM

## 2023-02-20 DIAGNOSIS — N189 Chronic kidney disease, unspecified: Secondary | ICD-10-CM

## 2023-02-20 DIAGNOSIS — E538 Deficiency of other specified B group vitamins: Secondary | ICD-10-CM

## 2023-02-20 DIAGNOSIS — R600 Localized edema: Secondary | ICD-10-CM | POA: Diagnosis not present

## 2023-02-20 LAB — COMPREHENSIVE METABOLIC PANEL
ALT: 18 U/L (ref 0–53)
AST: 25 U/L (ref 0–37)
Albumin: 4.1 g/dL (ref 3.5–5.2)
Alkaline Phosphatase: 56 U/L (ref 39–117)
BUN: 28 mg/dL — ABNORMAL HIGH (ref 6–23)
CO2: 26 mEq/L (ref 19–32)
Calcium: 9.3 mg/dL (ref 8.4–10.5)
Chloride: 103 mEq/L (ref 96–112)
Creatinine, Ser: 1.28 mg/dL (ref 0.40–1.50)
GFR: 47.12 mL/min — ABNORMAL LOW (ref >60.00)
Glucose, Bld: 96 mg/dL (ref 70–99)
Potassium: 5.5 mEq/L — ABNORMAL HIGH (ref 3.5–5.1)
Sodium: 137 mEq/L (ref 135–145)
Total Bilirubin: 1 mg/dL (ref 0.2–1.2)
Total Protein: 6.4 g/dL (ref 6.0–8.3)

## 2023-02-20 LAB — URIC ACID: Uric Acid, Serum: 5.6 mg/dL (ref 4.0–7.8)

## 2023-02-20 LAB — CBC WITH DIFFERENTIAL/PLATELET
Basophils Absolute: 0.1 10*3/uL (ref 0.0–0.1)
Basophils Relative: 0.8 % (ref 0.0–3.0)
Eosinophils Absolute: 0.2 10*3/uL (ref 0.0–0.7)
Eosinophils Relative: 2.5 % (ref 0.0–5.0)
HCT: 39.5 % (ref 39.0–52.0)
Hemoglobin: 13.4 g/dL (ref 13.0–17.0)
Lymphocytes Relative: 16.3 % (ref 12.0–46.0)
Lymphs Abs: 1.4 10*3/uL (ref 0.7–4.0)
MCHC: 34 g/dL (ref 30.0–36.0)
MCV: 98.7 fl (ref 78.0–100.0)
Monocytes Absolute: 0.7 10*3/uL (ref 0.1–1.0)
Monocytes Relative: 7.7 % (ref 3.0–12.0)
Neutro Abs: 6.5 10*3/uL (ref 1.4–7.7)
Neutrophils Relative %: 72.7 % (ref 43.0–77.0)
Platelets: 198 10*3/uL (ref 150.0–400.0)
RBC: 4 Mil/uL — ABNORMAL LOW (ref 4.22–5.81)
RDW: 16.1 % — ABNORMAL HIGH (ref 11.5–15.5)
WBC: 8.9 10*3/uL (ref 4.0–10.5)

## 2023-02-20 LAB — LIPID PANEL
Cholesterol: 103 mg/dL (ref 0–200)
HDL: 46.5 mg/dL (ref >39.00)
LDL Cholesterol: 39 mg/dL (ref 0–99)
NonHDL: 56.22
Total CHOL/HDL Ratio: 2
Triglycerides: 87 mg/dL (ref 0.0–149.0)
VLDL: 17.4 mg/dL (ref 0.0–40.0)

## 2023-02-20 LAB — HEMOGLOBIN A1C: Hgb A1c MFr Bld: 6.2 % (ref 4.6–6.5)

## 2023-02-20 MED ORDER — CYANOCOBALAMIN 1000 MCG/ML IJ SOLN
1000.0000 ug | Freq: Once | INTRAMUSCULAR | Status: AC
  Administered 2023-02-20: 1000 ug via INTRAMUSCULAR

## 2023-02-20 NOTE — Patient Instructions (Signed)
Gout  Gout is a condition that causes painful swelling of the joints. Gout is a type of inflammation of the joints (arthritis). This condition is caused by having too much uric acid in the body. Uric acid is a chemical that forms when the body breaks down substances called purines. Purines are important for building body proteins. When the body has too much uric acid, sharp crystals can form and build up inside the joints. This causes pain and swelling. Gout attacks can happen quickly and may be very painful (acute gout). Over time, the attacks can affect more joints and become more frequent (chronic gout). Gout can also cause uric acid to build up under the skin and inside the kidneys. What are the causes? This condition is caused by too much uric acid in your blood. This can happen because: Your kidneys do not remove enough uric acid from your blood. This is the most common cause. Your body makes too much uric acid. This can happen with some cancers and cancer treatments. It can also occur if your body is breaking down too many red blood cells (hemolytic anemia). You eat too many foods that are high in purines. These foods include organ meats and some seafood. Alcohol, especially beer, is also high in purines. A gout attack may be triggered by trauma or stress. What increases the risk? The following factors may make you more likely to develop this condition: Having a family history of gout. Being male and middle-aged. Being male and having gone through menopause. Taking certain medicines, including aspirin, cyclosporine, diuretics, levodopa, and niacin. Having an organ transplant. Having certain conditions, such as: Being obese. Lead poisoning. Kidney disease. A skin condition called psoriasis. Other factors include: Losing weight too quickly. Being dehydrated. Frequently drinking alcohol, especially beer. Frequently drinking beverages that are sweetened with a type of sugar called  fructose. What are the signs or symptoms? An attack of acute gout happens quickly. It usually occurs in just one joint. The most common place is the big toe. Attacks often start at night. Other joints that may be affected include joints of the feet, ankle, knee, fingers, wrist, or elbow. Symptoms of this condition may include: Severe pain. Warmth. Swelling. Stiffness. Tenderness. The affected joint may be very painful to touch. Shiny, red, or purple skin. Chills and fever. Chronic gout may cause symptoms more frequently. More joints may be involved. You may also have white or yellow lumps (tophi) on your hands or feet or in other areas near your joints. How is this diagnosed? This condition is diagnosed based on your symptoms, your medical history, and a physical exam. You may have tests, such as: Blood tests to measure uric acid levels. Removal of joint fluid with a thin needle (aspiration) to look for uric acid crystals. X-rays to look for joint damage. How is this treated? Treatment for this condition has two phases: treating an acute attack and preventing future attacks. Acute gout treatment may include medicines to reduce pain and swelling, including: NSAIDs, such as ibuprofen. Steroids. These are strong anti-inflammatory medicines that can be taken by mouth (orally) or injected into a joint. Colchicine. This medicine relieves pain and swelling when it is taken soon after an attack. It can be given by mouth or through an IV. Preventive treatment may include: Daily use of smaller doses of NSAIDs or colchicine. Use of a medicine that reduces uric acid levels in your blood, such as allopurinol. Changes to your diet. You may need to see   a dietitian about what to eat and drink to prevent gout. Follow these instructions at home: During a gout attack  If directed, put ice on the affected area. To do this: Put ice in a plastic bag. Place a towel between your skin and the bag. Leave the  ice on for 20 minutes, 2-3 times a day. Remove the ice if your skin turns bright red. This is very important. If you cannot feel pain, heat, or cold, you have a greater risk of damage to the area. Raise (elevate) the affected joint above the level of your heart as often as possible. Rest the joint as much as possible. If the affected joint is in your leg, you may be given crutches to use. Follow instructions from your health care provider about eating or drinking restrictions. Avoiding future gout attacks Follow a low-purine diet as told by your dietitian or health care provider. Avoid foods and drinks that are high in purines, including liver, kidney, anchovies, asparagus, herring, mushrooms, mussels, and beer. Maintain a healthy weight or lose weight if you are overweight. If you want to lose weight, talk with your health care provider. Do not lose weight too quickly. Start or maintain an exercise program as told by your health care provider. Eating and drinking Avoid drinking beverages that contain fructose. Drink enough fluids to keep your urine pale yellow. If you drink alcohol: Limit how much you have to: 0-1 drink a day for women who are not pregnant. 0-2 drinks a day for men. Know how much alcohol is in a drink. In the [place] one drink equals one 12 oz bottle of beer (355 mL), one 5 oz glass of wine (148 mL), or one 1 oz glass of hard liquor (44 mL). General instructions Take over-the-counter and prescription medicines only as told by your health care provider. Ask your health care provider if the medicine prescribed to you requires you to avoid driving or using machinery. Return to your normal activities as told by your health care provider. Ask your health care provider what activities are safe for you. Keep all follow-up visits. This is important. Where to find more information National Institutes of Health: www.niams.nih.gov Contact a health care provider if you have: Another  gout attack. Continuing symptoms of a gout attack after 10 days of treatment. Side effects from your medicines. Chills or a fever. Burning pain when you urinate. Pain in your lower back or abdomen. Get help right away if you: Have severe or uncontrolled pain. Cannot urinate. Summary Gout is painful swelling of the joints caused by having too much uric acid in the body. The most common site for gout to occur is in the big toe, but it can affect other joints in the body. Medicines and dietary changes can help to prevent and treat gout attacks. This information is not intended to replace advice given to you by your health care provider. Make sure you discuss any questions you have with your health care provider. Document Revised: 08/18/2021 Document Reviewed: 08/18/2021 Elsevier Patient Education  2023 Elsevier Inc.  

## 2023-02-20 NOTE — Assessment & Plan Note (Signed)
Has flared slightly this week. Cardiology did stop his ARB and start him on Jardiance and Entresto last week so if edema continues to worsen he will discuss with them. Elevate feet above heart, stay active and use compression hose when out for long periods of time.

## 2023-02-21 ENCOUNTER — Other Ambulatory Visit: Payer: Self-pay

## 2023-02-21 DIAGNOSIS — I10 Essential (primary) hypertension: Secondary | ICD-10-CM

## 2023-02-21 LAB — TSH: TSH: 1.2 u[IU]/mL (ref 0.35–5.50)

## 2023-02-21 LAB — VITAMIN B12: Vitamin B-12: >1500 pg/mL — ABNORMAL HIGH (ref 211–911)

## 2023-02-22 ENCOUNTER — Telehealth: Payer: Self-pay | Admitting: Cardiology

## 2023-02-22 MED ORDER — FUROSEMIDE 20 MG PO TABS: 20.0000 mg | ORAL_TABLET | Freq: Every day | ORAL | 3 refills | Status: DC

## 2023-02-22 NOTE — Telephone Encounter (Signed)
Pt c/o medication issue:  1. Name of Medication:  furosemide (LASIX) 20 MG tablet  2. How are you currently taking this medication (dosage and times per day)?   3. Are you having a reaction (difficulty breathing--STAT)?   4. What is your medication issue?   Patient is requesting to speak with Dr. Jacalyn Lefevre nurse. He states on 2/27 Furosemide was decreased to once daily, but his prescription does not reflect this. He would like a call back to confirm the correct dose.

## 2023-02-22 NOTE — Telephone Encounter (Signed)
Patient called concerned because his prescription does not reflect the change.  Notes from Labs on 2/27 he was told to take Lasix to 20 mg daily and he may take an additional 20 mg daily as needed for swelling, weight gain. Assured him he had the information correct and will update the prescription and send a message to the provider.  He states no swelling.  He did over the years "have swelling in the right foot but it has lately diminished". He does not take his weight but once a week.  He has increased 1-2 lbs from 130 until he has reached 140, but he could not tell how long a time frame. He did say no SOB and he will start keeping a record twice a week at least.  Nothing further needed at this time.

## 2023-02-24 ENCOUNTER — Telehealth: Payer: Self-pay | Admitting: Cardiology

## 2023-02-24 DIAGNOSIS — Z79899 Other long term (current) drug therapy: Secondary | ICD-10-CM

## 2023-02-24 NOTE — Telephone Encounter (Signed)
Patient wanted to let Dr. Stanford Breed or nurse know that his left foot is now swollen like his right foot. And also he said that he adhered to what Dr. Stanford Breed to him to do with the medication potassium chloride (KLOR-CON) 10 MEQ tablet . Please call patient back .

## 2023-02-24 NOTE — Telephone Encounter (Signed)
Patient called back and states he has swelling to both feet.  He does not remember stating it had "almost diminished" in his foot.  It has not diminished and now the left foot is swollen too.  He states it is toes to ankles. Pitting , by checking while on phone. He weighed yesterday and was 142.He states he was told to hold potassium Wednesday and Thursday and resume it today.  During call today he now states he is taking the Lasix twice a day, where yesterday  he was concerned that his list didn't reflect change of only taking once a day.  There is confusion on his part.  Please advise on his correct medication dosage and if any recommendations for the increase in swelling

## 2023-03-02 ENCOUNTER — Other Ambulatory Visit (INDEPENDENT_AMBULATORY_CARE_PROVIDER_SITE_OTHER): Payer: Medicare Other

## 2023-03-02 DIAGNOSIS — I10 Essential (primary) hypertension: Secondary | ICD-10-CM | POA: Diagnosis not present

## 2023-03-02 DIAGNOSIS — M109 Gout, unspecified: Secondary | ICD-10-CM | POA: Diagnosis not present

## 2023-03-02 LAB — COMPREHENSIVE METABOLIC PANEL
ALT: 16 U/L (ref 0–53)
AST: 25 U/L (ref 0–37)
Albumin: 4 g/dL (ref 3.5–5.2)
Alkaline Phosphatase: 58 U/L (ref 39–117)
BUN: 37 mg/dL — ABNORMAL HIGH (ref 6–23)
CO2: 24 mEq/L (ref 19–32)
Calcium: 9.3 mg/dL (ref 8.4–10.5)
Chloride: 105 mEq/L (ref 96–112)
Creatinine, Ser: 1.35 mg/dL (ref 0.40–1.50)
GFR: 44.2 mL/min — ABNORMAL LOW (ref >60.00)
Glucose, Bld: 86 mg/dL (ref 70–99)
Potassium: 4.7 mEq/L (ref 3.5–5.1)
Sodium: 137 mEq/L (ref 135–145)
Total Bilirubin: 0.8 mg/dL (ref 0.2–1.2)
Total Protein: 6.1 g/dL (ref 6.0–8.3)

## 2023-03-02 LAB — URIC ACID: Uric Acid, Serum: 5.7 mg/dL (ref 4.0–7.8)

## 2023-03-03 MED ORDER — FUROSEMIDE 20 MG PO TABS: 40.0000 mg | ORAL_TABLET | Freq: Every day | ORAL | 3 refills | Status: DC

## 2023-03-03 NOTE — Telephone Encounter (Signed)
Please see other encounter.

## 2023-03-03 NOTE — Telephone Encounter (Signed)
Daniel Bunting, MD  [name], RN4 days ago    Take lasix 40 mg daily; bmet one week [name]    Returned call to patient and made him aware of the above- medication list updated. Blood work ordered for 1 week. Patient aware and verbalized understanding.   Advised patient to call back to office with any issues, questions, or concerns. Patient verbalized understanding.

## 2023-03-06 ENCOUNTER — Other Ambulatory Visit: Payer: Self-pay | Admitting: Family Medicine

## 2023-03-07 ENCOUNTER — Other Ambulatory Visit: Payer: Self-pay | Admitting: Family Medicine

## 2023-03-08 DIAGNOSIS — R972 Elevated prostate specific antigen [PSA]: Secondary | ICD-10-CM | POA: Diagnosis not present

## 2023-03-09 DIAGNOSIS — I255 Ischemic cardiomyopathy: Secondary | ICD-10-CM | POA: Diagnosis not present

## 2023-03-10 LAB — BASIC METABOLIC PANEL
BUN/Creatinine Ratio: 23 (ref 10–24)
BUN: 30 mg/dL (ref 10–36)
CO2: 20 mmol/L (ref 20–29)
Calcium: 9.1 mg/dL (ref 8.6–10.2)
Chloride: 105 mmol/L (ref 96–106)
Creatinine, Ser: 1.3 mg/dL — ABNORMAL HIGH (ref 0.76–1.27)
Glucose: 90 mg/dL (ref 70–99)
Potassium: 4.8 mmol/L (ref 3.5–5.2)
Sodium: 138 mmol/L (ref 134–144)
eGFR: 50 mL/min/{1.73_m2} — ABNORMAL LOW (ref >59)

## 2023-03-13 ENCOUNTER — Encounter: Payer: Self-pay | Admitting: *Deleted

## 2023-03-15 ENCOUNTER — Other Ambulatory Visit: Payer: Self-pay | Admitting: Family Medicine

## 2023-03-15 DIAGNOSIS — R972 Elevated prostate specific antigen [PSA]: Secondary | ICD-10-CM | POA: Diagnosis not present

## 2023-03-15 DIAGNOSIS — N403 Nodular prostate with lower urinary tract symptoms: Secondary | ICD-10-CM | POA: Diagnosis not present

## 2023-03-15 DIAGNOSIS — R351 Nocturia: Secondary | ICD-10-CM | POA: Diagnosis not present

## 2023-03-15 DIAGNOSIS — R3912 Poor urinary stream: Secondary | ICD-10-CM | POA: Diagnosis not present

## 2023-03-23 ENCOUNTER — Other Ambulatory Visit: Payer: Self-pay | Admitting: Family Medicine

## 2023-03-23 ENCOUNTER — Encounter: Payer: Self-pay | Admitting: *Deleted

## 2023-03-23 NOTE — Progress Notes (Signed)
HPI: FU CAD; s/p CABG in 1979 and 1993, ischemic CM, systolic CHF, AAA, HTN, HL. Patient underwent cardiac catheterization in May of 2014. The left main, LAD, circumflex and RCA were occluded. The LIMA to the LAD was patent and the distal LAD was occluded after the insertion. Saphenous vein graft to the obtuse marginal was occluded. Saphenous vein graft to the acute marginal and PDA had a high-grade lesion prior to insertion into the PDA of 80-90%. Ejection fraction was 25%. PCI: Promus Premier (3.5x12 mm) DES to the [place]. Repeat catheterization in October 2014 because of recurrent chest pain. The stent placed in the saphenous vein graft to the PDA had mild in-stent restenosis. Medical therapy recommended. Nuclear study 2/17 showed EF 35, inferolateral scar, no ischemia. Carotid Dopplers February 2018 showed less than 50% bilateral stenosis. Most recent echocardiogram April 2022 showed ejection fraction 25 to 30%, trace aortic insufficiency.  Seen in the emergency room December 2023 with CHF.  Treated with IV Lasix.  Abdominal ultrasound February 2024 showed 4.7 cm abdominal aortic aneurysm.  Since he was last seen, he denies dyspnea, chest pain, palpitations or syncope.  Occasional minimal pedal edema. Current Outpatient Medications  Medication Sig Dispense Refill   aspirin EC 81 MG tablet Take 81 mg by mouth every morning.      b complex vitamins capsule Take 1 capsule by mouth daily.     busPIRone (BUSPAR) 5 MG tablet Take 1 tablet (5 mg total) by mouth 2 (two) times daily as needed. 180 tablet 0   Cholecalciferol (VITAMIN D-3 PO) Take 5,000 Units by mouth daily with breakfast.     empagliflozin (JARDIANCE) 10 MG TABS tablet Take 1 tablet (10 mg total) by mouth daily before breakfast. 30 tablet 11   famotidine (PEPCID) 20 MG tablet Take 1 tablet by mouth twice daily 180 tablet 0   finasteride (PROSCAR) 5 MG tablet Takes every third day     folic acid (FOLVITE) 400 MCG tablet Take 400 mcg by  mouth 2 (two) times daily.     furosemide (LASIX) 20 MG tablet Take 2 tablets (40 mg total) by mouth daily. 90 tablet 3   metoprolol succinate (TOPROL-XL) 25 MG 24 hr tablet Take 1/2 (one-half) tablet by mouth once daily 45 tablet 3   Misc Natural Products (OSTEO BI-FLEX ADV JOINT SHIELD) TABS Take 1 tablet by mouth 2 (two) times daily.     Multiple Vitamin (MULTIVITAMIN) tablet Take 1 tablet by mouth daily.     nitroGLYCERIN (NITROSTAT) 0.4 MG SL tablet DISSOLVE ONE TABLET UNDER THE TONGUE EVERY 5 MINUTES AS NEEDED FOR CHEST PAIN.  DO NOT EXCEED A TOTAL OF 3 DOSES IN 15 MINUTES 25 tablet 2   potassium chloride (KLOR-CON) 10 MEQ tablet Take 1 tablet (10 mEq total) by mouth 2 (two) times daily. 180 tablet 3   sacubitril-valsartan (ENTRESTO) 24-26 MG Take 1 tablet by mouth 2 (two) times daily. 60 tablet 11   simvastatin (ZOCOR) 40 MG tablet TAKE 1 TABLET BY MOUTH ONCE DAILY IN THE EVENING 90 tablet 1   No current facility-administered medications for this visit.     Past Medical History:  Diagnosis Date   Abdominal aortic aneurysm (HCC)    a. [place] (1/14):  3.3 x 3.4 cm => f/u 11/2013   Amebic dysentery    AMEBIC DYSENTERY 11/19/2007   Qualifier: History of  By: Kriste Basque MD, Lonzo Cloud    Anemia 03/07/2017   ANXIETY 11/19/2007   Qualifier: Diagnosis  of  By: Kriste Basque MD, Lonzo Cloud    Arthritis 03/07/2017   Atherosclerosis of coronary artery bypass graft with unstable angina pectoris (HCC) 04/19/2013   BACK PAIN, LUMBAR 11/16/2007   Qualifier: Diagnosis of  By: Renaldo Fiddler CMA, Leigh     Benign prostatic hypertrophy    BENIGN PROSTATIC HYPERTROPHY, HX OF 11/16/2007   Qualifier: Diagnosis of  By: Renaldo Fiddler CMA, Leigh     BRBPR (bright red blood per rectum) 11/01/2016   CAD (coronary artery disease)    a. s/p CABG in 1979 and 1993;  b. LHC (5/14):  LM, LAD, CFX and RCA occluded; L-LAD ok, dLAD occluded after insertion of LIMA, S-OM occluded, S-PDA/AM 80-90 => PCI with Promus DES; EF 25%   Cardiomyopathy, ischemic  06/05/2013   Cerumen impaction    Bilateral   Chicken pox as a child   Chronic systolic CHF (congestive heart failure) (HCC)    COLONIC POLYPS 07/01/2008   Qualifier: Diagnosis of  By: Kriste Basque MD, Scott M    Degenerative joint disease    DEGENERATIVE JOINT DISEASE 11/16/2007   Qualifier: Diagnosis of  By: Renaldo Fiddler CMA, Leigh     Diverticulosis of colon    DIVERTICULOSIS OF COLON 07/01/2008   Qualifier: Diagnosis of  By: Kriste Basque MD, Lonzo Cloud    Double vision 03/07/2017   Essential hypertension 11/16/2007   Qualifier: Diagnosis of  By: Renaldo Fiddler CMA, Leigh     Fingernail abnormalities 03/07/2017   FLANK PAIN, RIGHT 02/03/2010   Qualifier: History of  By: Kriste Basque MD, Lonzo Cloud    GERD (gastroesophageal reflux disease)    GOUT 11/16/2007   Qualifier: Diagnosis of  By: Renaldo Fiddler CMA, Leigh     Hearing loss 11/24/2014   Heart murmur    History of shingles 10/27/2017   Hypercholesterolemia    HYPERCHOLESTEROLEMIA 11/16/2007   Qualifier: Diagnosis of  By: Renaldo Fiddler CMA, Leigh     Ischemic cardiomyopathy    a. echo (09/05/13): EF 35%, diffuse HK worsened distal septal, mid/distal inferior and apical region, grade 1 diastolic dysfunction, mild LAE.     Kidney stone 08/17/2011   Loss of hearing    Lumbar back pain    Measles as a child   Medicare annual wellness visit, subsequent 11/24/2014   Sees Dr Emily Filbert for dermatology Sees Dr Wilson Singer of Urology Sees Dr Jens Som of cardiology Sees Dr Alecia Lemming of Opthamology No further colonoscopies warranted        Mumps as a child   Nephrolithiasis    Pain in joint, lower leg 07/29/2014   PERIPHERAL VASCULAR DISEASE 11/16/2007   Qualifier: Diagnosis of  By: Renaldo Fiddler CMA, Leigh     Peripheral vascular disease (HCC)    Rectal bleeding 03/07/2017   Shingles 07/29/2014   Sun-damaged skin 05/31/2014    Past Surgical History:  Procedure Laterality Date   CORONARY ANGIOPLASTY WITH STENT PLACEMENT  04/18/2013   RCA        CORONARY ARTERY BYPASS GRAFT  1979   x4 SVG-DIAG-LAD, SVG-OM-PDA    CORONARY ARTERY BYPASS GRAFT  1993   Redo x5 by Dr Gerrit Friends; Pollyann Glen, SVG-OM, SVG-AM-PL   Decompressive laminectomy  01/2006   L2 - scarum by Dr. Simonne Come   HEMORRHOID SURGERY     fissure with hemorrhoid corrected at age 4   INGUINAL HERNIA REPAIR  1994   Right by Dr Philippa Chester HERNIA REPAIR  1996   Left by Dr. Gerrit Friends   LEFT HEART CATHETERIZATION WITH CORONARY ANGIOGRAM N/A 09/23/2013  Procedure: LEFT HEART CATHETERIZATION WITH CORONARY ANGIOGRAM;  Surgeon: Micheline Chapman, MD;  Location: [facility] CATH LAB;  Service: Cardiovascular;  Laterality: N/A;   lens implants     for vision correction   PERCUTANEOUS CORONARY STENT INTERVENTION (PCI-S) N/A 04/18/2013   Procedure: PERCUTANEOUS CORONARY STENT INTERVENTION (PCI-S);  Surgeon: [name], MD;  Location: [facility] CATH LAB;  Service: Cardiovascular;  Laterality: N/A;   TONSILLECTOMY      Social History   Socioeconomic History   Marital status: Widowed    Spouse name: Magda Paganini x 72 years   Number of children: 7   Years of education: Not on file   Highest education level: Not on file  Occupational History   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   Other Topics Concern   Not on file  Social History Narrative   Married   7 children   Social Determinants of Health   Financial Resource Strain: Low Risk  (12/03/2021)   Overall Financial Resource Strain (CARDIA)    Difficulty of Paying Living Expenses: Not hard at all  Food Insecurity: No Food  Insecurity (12/03/2021)   Hunger Vital Sign    Worried About Running Out of Food in the Last Year: Never true    Ran Out of Food in the Last Year: Never true  Transportation Needs: No Transportation Needs (12/03/2021)   PRAPARE - Administrator, Civil Service (Medical): No    Lack of Transportation (Non-Medical): No  Physical Activity: Inactive (12/03/2021)   Exercise Vital Sign    Days of Exercise per Week: 0 days    Minutes of Exercise per Session: 0 min  Stress: No Stress Concern Present (12/03/2021)   [employer] of Occupational Health - Occupational Stress Questionnaire    Feeling of Stress : Not at all  Social Connections: Moderately Isolated (12/03/2021)   Social Connection and Isolation Panel [NHANES]    Frequency of Communication with Friends and Family: More than three times a week    Frequency of Social Gatherings with Friends and Family: More than three times a week    Attends Religious Services: 1 to 4 times per year    Active Member of [employer] or Organizations: No    Attends Banker Meetings: Never    Marital Status: Widowed  Intimate Partner Violence: Not At Risk (12/03/2021)   Humiliation, Afraid, Rape, and Kick questionnaire    Fear of Current or Ex-Partner: No    Emotionally Abused: No    Physically Abused: No    Sexually Abused: No    Family History  Problem Relation Age of Onset   Parkinsonism Brother    Diabetes Maternal Grandmother    Depression Daughter    Other Son        4 stents   Heart disease Son    Diabetes Son        type 2  Colon cancer Neg Hx    Esophageal cancer Neg Hx    Rectal cancer Neg Hx    Stomach cancer Neg Hx     ROS: no fevers or chills, productive cough, hemoptysis, dysphasia, odynophagia, melena, hematochezia, dysuria, hematuria, rash, seizure activity, orthopnea, PND, claudication. Remaining systems are negative.  Physical Exam: Well-developed well-nourished in no acute distress.  Skin is warm and dry.   HEENT is normal.  Neck is supple.  Chest is clear to auscultation with normal expansion.  Cardiovascular exam is regular rate and rhythm.  Abdominal exam nontender or distended. No masses palpated. Extremities show trace edema. neuro grossly intact   A/P  1 coronary artery disease-patient denies chest pain.  Continue aspirin and statin.  2 ischemic cardiomyopathy-continue Entresto, beta-blocker and Jardiance.  Continue present dose of lasix. Add spironolactone 12.5 mg daily; check bmet one week.   3 hypertension-blood pressure controlled.  Continue present medical regimen.  4 hyperlipidemia-continue statin.  5 palpitations-symptoms are controlled.  Continue beta-blocker.  6 abdominal aortic aneurysm-as outlined previously patient is nearing 89 years old and would not be a good candidate for repair regardless of findings.  We have therefore elected not to pursue follow-up imaging.  Patient agrees with this.  7 chronic renal insufficiency-recheck potassium and renal function one week.  [name], MD

## 2023-03-29 ENCOUNTER — Encounter: Payer: Self-pay | Admitting: Cardiology

## 2023-03-29 ENCOUNTER — Ambulatory Visit: Payer: Medicare Other | Attending: Cardiology | Admitting: Cardiology

## 2023-03-29 VITALS — BP 112/66 | HR 100 | Ht 70.0 in | Wt 144.0 lb

## 2023-03-29 DIAGNOSIS — E785 Hyperlipidemia, unspecified: Secondary | ICD-10-CM | POA: Diagnosis not present

## 2023-03-29 DIAGNOSIS — I714 Abdominal aortic aneurysm, without rupture, unspecified: Secondary | ICD-10-CM | POA: Insufficient documentation

## 2023-03-29 DIAGNOSIS — I5022 Chronic systolic (congestive) heart failure: Secondary | ICD-10-CM | POA: Diagnosis not present

## 2023-03-29 DIAGNOSIS — I10 Essential (primary) hypertension: Secondary | ICD-10-CM | POA: Insufficient documentation

## 2023-03-29 DIAGNOSIS — I2510 Atherosclerotic heart disease of native coronary artery without angina pectoris: Secondary | ICD-10-CM | POA: Insufficient documentation

## 2023-03-29 DIAGNOSIS — I255 Ischemic cardiomyopathy: Secondary | ICD-10-CM | POA: Insufficient documentation

## 2023-03-29 MED ORDER — SPIRONOLACTONE 25 MG PO TABS
12.5000 mg | ORAL_TABLET | Freq: Every day | ORAL | 3 refills | Status: DC
Start: 2023-03-29 — End: 2023-08-02

## 2023-03-29 NOTE — Patient Instructions (Signed)
Medication Instructions:   START SPIRONOLACTONE 12.5 MG ONCE DAILY= 1/2 OF THE 25 MG TABLET ONCE DAILY  *If you need a refill on your cardiac medications before your next appointment, please call your pharmacy*   Lab Work:  Your physician recommends that you return for lab work in: ONE WEEK-DO NOT NEED TO FAST  [facility]  Located on the 3 rd floor in [address] Hours-Monday - Friday 8 am-12 noon and 1 pm -4 pm  If you have labs (blood work) drawn today and your tests are completely normal, you will receive your results only by: MyChart Message (if you have MyChart) OR A paper copy in the mail If you have any lab test that is abnormal or we need to change your treatment, we will call you to review the results.   Follow-Up: At [facility], you and your health needs are our priority.  As part of our continuing mission to provide you with exceptional heart care, we have created designated Provider Care Teams.  These Care Teams include your primary Cardiologist (physician) and Advanced Practice Providers (APPs -  Physician Assistants and Nurse Practitioners) who all work together to provide you with the care you need, when you need it.  We recommend signing up for the patient portal called "MyChart".  Sign up information is provided on this After Visit Summary.  MyChart is used to connect with patients for Virtual Visits (Telemedicine).  Patients are able to view lab/test results, encounter notes, upcoming appointments, etc.  Non-urgent messages can be sent to your provider as well.   To learn more about what you can do with MyChart, go to [email or web address].    Your next appointment:   6 month(s)  Provider:   [name] MD

## 2023-04-06 DIAGNOSIS — I255 Ischemic cardiomyopathy: Secondary | ICD-10-CM | POA: Diagnosis not present

## 2023-04-06 DIAGNOSIS — I5022 Chronic systolic (congestive) heart failure: Secondary | ICD-10-CM | POA: Diagnosis not present

## 2023-04-07 ENCOUNTER — Telehealth: Payer: Self-pay | Admitting: *Deleted

## 2023-04-07 DIAGNOSIS — N289 Disorder of kidney and ureter, unspecified: Secondary | ICD-10-CM

## 2023-04-07 DIAGNOSIS — E875 Hyperkalemia: Secondary | ICD-10-CM

## 2023-04-07 LAB — BASIC METABOLIC PANEL
BUN/Creatinine Ratio: 26 — ABNORMAL HIGH (ref 10–24)
BUN: 42 mg/dL — ABNORMAL HIGH (ref 10–36)
CO2: 17 mmol/L — ABNORMAL LOW (ref 20–29)
Calcium: 9.8 mg/dL (ref 8.6–10.2)
Chloride: 104 mmol/L (ref 96–106)
Creatinine, Ser: 1.59 mg/dL — ABNORMAL HIGH (ref 0.76–1.27)
Glucose: 91 mg/dL (ref 70–99)
Potassium: 5.4 mmol/L — ABNORMAL HIGH (ref 3.5–5.2)
Sodium: 137 mmol/L (ref 134–144)
eGFR: 39 mL/min/{1.73_m2} — ABNORMAL LOW (ref >59)

## 2023-04-07 MED ORDER — FUROSEMIDE 20 MG PO TABS
20.0000 mg | ORAL_TABLET | Freq: Every day | ORAL | 3 refills | Status: DC
Start: 2023-04-07 — End: 2024-02-08

## 2023-04-07 NOTE — Telephone Encounter (Signed)
Spoke with pt, Aware of dr crenshaw's recommendations.  Lab orders mailed to the pt  

## 2023-04-07 NOTE — Telephone Encounter (Signed)
-----   Message from Lewayne Bunting, MD sent at 04/07/2023  8:20 AM EDT ----- Decrease lasix to 20 mg daily; DC KCL; bmet one week [name]

## 2023-04-14 DIAGNOSIS — N289 Disorder of kidney and ureter, unspecified: Secondary | ICD-10-CM | POA: Diagnosis not present

## 2023-04-14 DIAGNOSIS — E875 Hyperkalemia: Secondary | ICD-10-CM | POA: Diagnosis not present

## 2023-04-15 LAB — BASIC METABOLIC PANEL
BUN/Creatinine Ratio: 24 (ref 10–24)
BUN: 39 mg/dL — ABNORMAL HIGH (ref 10–36)
CO2: 17 mmol/L — ABNORMAL LOW (ref 20–29)
Calcium: 9.2 mg/dL (ref 8.6–10.2)
Chloride: 106 mmol/L (ref 96–106)
Creatinine, Ser: 1.61 mg/dL — ABNORMAL HIGH (ref 0.76–1.27)
Glucose: 84 mg/dL (ref 70–99)
Potassium: 4.5 mmol/L (ref 3.5–5.2)
Sodium: 138 mmol/L (ref 134–144)
eGFR: 39 mL/min/{1.73_m2} — ABNORMAL LOW (ref >59)

## 2023-04-19 ENCOUNTER — Encounter: Payer: Self-pay | Admitting: *Deleted

## 2023-06-11 NOTE — Assessment & Plan Note (Signed)
Hydrate and monitor 

## 2023-06-11 NOTE — Assessment & Plan Note (Signed)
Supplement and monitor 

## 2023-06-11 NOTE — Progress Notes (Signed)
Subjective:    Patient ID: Daniel Reeves, male    DOB: 1924/03/26, 89 y.o.   MRN: [id number]  Chief Complaint  Patient presents with   Follow-up    Follow up and B-12    HPI Discussed the use of AI scribe software for clinical note transcription with the patient, who gave verbal consent to proceed.  History of Present Illness   The patient, an elderly man with a history of B12 deficiency, presents with his daughter for a routine check-up. He reports generally feeling well and continues to be active, including riding his lawnmower and spending time outdoors. However, he has been eating smaller meals and has experienced some weight loss, which has raised concerns for his wife. The patient's wife reports that he often cuts meals in half and tries to spread them out. Despite this, the patient maintains that he is eating well and consuming a daily Boost drink for additional nutrition. The patient's weight has been relatively stable over the past year, fluctuating between 135 and 150 pounds. The patient also reports taking monthly B12 shots due to a positive intrinsic factor, indicating difficulty absorbing B12 through his gut.    Patient is a 89 yo male in today for follow up on chronic medical concerns. No recent febrile illness or hospitalizations. Denies CP/palp/SOB/HA/congestion/fevers/GI or GU c/o. Taking meds as prescribed     Past Medical History:  Diagnosis Date   Abdominal aortic aneurysm (HCC)    a. 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 s/p CABG in 1979 and 1993;  b. LHC (5/14):  LM, LAD, CFX and RCA occluded; L-LAD ok, dLAD occluded after insertion of LIMA, S-OM occluded, S-PDA/AM 80-90 => PCI with Promus DES; EF 25%   Cardiomyopathy, ischemic 06/05/2013   Cerumen impaction    Bilateral   Chicken pox as a child   Chronic systolic CHF (congestive heart failure) (HCC)    COLONIC POLYPS 07/01/2008   Qualifier: Diagnosis of  By: Kriste Basque MD, Scott M    Degenerative joint disease    DEGENERATIVE JOINT DISEASE 11/16/2007   Qualifier: Diagnosis of  By: Renaldo Fiddler CMA, Leigh     Diverticulosis of colon    DIVERTICULOSIS OF COLON 07/01/2008   Qualifier: Diagnosis of  By: Kriste Basque MD, Lonzo Cloud    Double vision 03/07/2017   Essential hypertension 11/16/2007   Qualifier: Diagnosis of  By: Renaldo Fiddler CMA, Leigh     Fingernail abnormalities 03/07/2017   FLANK PAIN, RIGHT 02/03/2010   Qualifier: History of  By: Kriste Basque MD, Lonzo Cloud    GERD (gastroesophageal reflux disease)    GOUT 11/16/2007   Qualifier: Diagnosis of  By: Renaldo Fiddler CMA, Leigh     Hearing loss 11/24/2014   Heart murmur    History  of shingles 10/27/2017   Hypercholesterolemia    HYPERCHOLESTEROLEMIA 11/16/2007   Qualifier: Diagnosis of  By: Renaldo Fiddler CMA, Leigh     Ischemic cardiomyopathy    a. echo (09/05/13): EF 35%, diffuse HK worsened distal septal, mid/distal inferior and apical region, grade 1 diastolic dysfunction, mild LAE.     Kidney stone 08/17/2011   Loss of hearing    Lumbar back pain    Measles as a child   Medicare annual wellness visit, subsequent 11/24/2014   Sees Dr Emily Filbert for dermatology Sees Dr Wilson Singer of Urology Sees Dr Jens Som of cardiology Sees Dr Alecia Lemming of Opthamology No further colonoscopies warranted        Mumps as a child   Nephrolithiasis    Pain in joint, lower leg 07/29/2014   PERIPHERAL VASCULAR  DISEASE 11/16/2007   Qualifier: Diagnosis of  By: Renaldo Fiddler CMA, Leigh     Peripheral vascular disease (HCC)    Rectal bleeding 03/07/2017   Shingles 07/29/2014   Sun-damaged skin 05/31/2014    Past Surgical History:  Procedure Laterality Date   CORONARY ANGIOPLASTY WITH STENT PLACEMENT  04/18/2013   RCA        CORONARY ARTERY BYPASS GRAFT  1979   x4 SVG-DIAG-LAD, SVG-OM-PDA   CORONARY ARTERY BYPASS GRAFT  1993   Redo x5 by Dr Gerrit Friends; Pollyann Glen, SVG-OM, SVG-AM-PL   Decompressive laminectomy  01/2006   L2 - scarum by Dr. Simonne Come   HEMORRHOID SURGERY     fissure with hemorrhoid corrected at age 32   INGUINAL HERNIA REPAIR  1994   Right by Dr Philippa Chester HERNIA REPAIR  1996   Left by Dr. Gerrit Friends   LEFT HEART CATHETERIZATION WITH CORONARY ANGIOGRAM N/A 09/23/2013   Procedure: LEFT HEART CATHETERIZATION WITH CORONARY ANGIOGRAM;  Surgeon: Micheline Chapman, MD;  Location: [facility] CATH LAB;  Service: Cardiovascular;  Laterality: N/A;   lens implants     for vision correction   PERCUTANEOUS CORONARY STENT INTERVENTION (PCI-S) N/A 04/18/2013   Procedure: PERCUTANEOUS CORONARY STENT INTERVENTION (PCI-S);  Surgeon: [name], MD;  Location: [facility] CATH LAB;  Service: Cardiovascular;  Laterality: N/A;   TONSILLECTOMY      Family History  Problem Relation Age of Onset   Parkinsonism Brother    Diabetes Maternal Grandmother    Depression Daughter    Other Son        4 stents   Heart disease Son    Diabetes Son        type 2   Colon cancer Neg Hx    Esophageal cancer Neg Hx    Rectal cancer Neg Hx    Stomach cancer Neg Hx     Social History   Socioeconomic History   Marital status: Widowed    Spouse name: Magda Paganini x 72 years   Number of children: 7   Years of education: Not on file   Highest education level: Not on file  Occupational History   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   Other Topics Concern   Not on file  Social History Narrative   Married   7 children   Social Determinants of Health   Financial Resource Strain: Low Risk  (12/03/2021)   Overall Financial Resource Strain (CARDIA)    Difficulty of Paying Living Expenses: Not hard at all  Food Insecurity: No Food Insecurity (12/03/2021)   Hunger Vital Sign    Worried About Running Out of Food in the Last Year: Never true    Ran Out of Food in the Last Year: Never true  Transportation Needs: No Transportation Needs (12/03/2021)   PRAPARE - Administrator, Civil Service (Medical): No    Lack of Transportation (Non-Medical): No  Physical Activity: Inactive (12/03/2021)   Exercise Vital Sign    Days of Exercise per Week: 0 days    Minutes of Exercise per Session: 0 min  Stress: No Stress Concern Present (12/03/2021)   [employer] of Occupational Health - Occupational Stress Questionnaire    Feeling of Stress : Not at all  Social Connections: Unknown (11/22/2022)   Received from [facility]   Social Network    Social Network: Not on file  Intimate Partner Violence: Unknown (11/22/2022)   Received from [facility], [facility]   HITS    Physically Hurt: Not on file    Insult or Talk Down To: Not on file    Threaten Physical Harm: Not on file    Scream or Curse: Not on file    Outpatient Medications Prior to Visit  Medication Sig Dispense Refill   aspirin EC 81 MG tablet Take 81 mg by mouth every morning.      b complex vitamins capsule Take 1  capsule by mouth daily.     busPIRone (BUSPAR) 5 MG tablet Take 1 tablet (5 mg total) by mouth 2 (two) times daily as needed. 180 tablet 0   Cholecalciferol (VITAMIN D-3 PO) Take 5,000 Units by mouth daily with breakfast.     empagliflozin (JARDIANCE) 10 MG TABS tablet Take 1 tablet (10 mg total) by mouth daily before breakfast. 30 tablet 11   famotidine (PEPCID) 20 MG tablet Take 1 tablet by mouth twice daily 180 tablet 0   finasteride (PROSCAR) 5 MG tablet Takes every third day     folic acid (FOLVITE) 400 MCG tablet Take 400 mcg by mouth 2 (two) times daily.     furosemide (LASIX) 20 MG tablet Take 1 tablet (20 mg total) by mouth daily. 90 tablet 3   metoprolol succinate (TOPROL-XL) 25 MG 24 hr tablet Take 1/2 (one-half) tablet by mouth once daily 45 tablet 3   Misc Natural Products (OSTEO BI-FLEX ADV JOINT SHIELD) TABS Take 1 tablet by mouth 2 (two) times daily.     Multiple Vitamin (MULTIVITAMIN) tablet Take 1 tablet by mouth daily.     nitroGLYCERIN (NITROSTAT) 0.4 MG SL tablet DISSOLVE ONE TABLET UNDER THE TONGUE EVERY 5 MINUTES AS NEEDED FOR CHEST PAIN.  DO NOT EXCEED A TOTAL OF 3 DOSES IN 15 MINUTES 25 tablet 2   sacubitril-valsartan (ENTRESTO) 24-26 MG Take 1 tablet by mouth 2 (two) times daily. 60 tablet 11   simvastatin (ZOCOR) 40 MG tablet TAKE 1 TABLET BY MOUTH ONCE DAILY IN THE EVENING 90 tablet 1   spironolactone (ALDACTONE) 25 MG tablet Take 0.5 tablets (12.5  mg total) by mouth daily. 45 tablet 3   No facility-administered medications prior to visit.    Allergies  Allergen Reactions   Baclofen Other (See Comments)    Altered mental status  Other Reaction(s): Delirium   Celecoxib Other (See Comments)    Altered mental status   Lisinopril Other (See Comments) and Rash    REACTION: dizziness   Methocarbamol Rash    REACTION: pt states "dizzy"  Other Reaction(s): Dizziness   Other     Other Reaction(s): Other   Pregabalin Rash    REACTION: pt states "dizzy"  Other  Reaction(s): Dizziness   Ramipril Hives and Rash    REACTION: hives and dizziness  Other Reaction(s): Dizziness   Tramadol Other (See Comments)    Altered mental status  Other Reaction(s): Delirium   Allopurinol Other (See Comments)    Review of Systems  Constitutional:  Negative for fever and malaise/fatigue.  HENT:  Negative for congestion.   Eyes:  Negative for blurred vision.  Respiratory:  Negative for shortness of breath.   Cardiovascular:  Negative for chest pain, palpitations and leg swelling.  Gastrointestinal:  Negative for abdominal pain, blood in stool and nausea.  Genitourinary:  Negative for dysuria and frequency.  Musculoskeletal:  Negative for falls.  Skin:  Negative for rash.  Neurological:  Negative for dizziness, loss of consciousness and headaches.  Endo/Heme/Allergies:  Negative for environmental allergies.  Psychiatric/Behavioral:  Negative for depression. The patient is not nervous/anxious.        Objective:    Physical Exam Vitals reviewed.  Constitutional:      Appearance: Normal appearance. He is not ill-appearing.  HENT:     Head: Normocephalic and atraumatic.     Nose: Nose normal.  Eyes:     Conjunctiva/sclera: Conjunctivae normal.  Cardiovascular:     Rate and Rhythm: Normal rate.     Pulses: Normal pulses.     Heart sounds: Normal heart sounds. No murmur heard. Pulmonary:     Effort: Pulmonary effort is normal.     Breath sounds: Normal breath sounds. No wheezing.  Abdominal:     Palpations: Abdomen is soft. There is no mass.     Tenderness: There is no abdominal tenderness.  Musculoskeletal:     Cervical back: Normal range of motion.     Right lower leg: No edema.     Left lower leg: No edema.  Skin:    General: Skin is warm and dry.  Neurological:     General: No focal deficit present.     Mental Status: He is alert and oriented to person, place, and time.  Psychiatric:        Mood and Affect: Mood normal.    BP 110/68 (BP  Location: Left Arm, Patient Position: Sitting, Cuff Size: Normal)   Pulse (!) 54   Temp 98 F (36.7 C) (Oral)   Resp 16   Ht 5\' 10"  (1.778 m)   Wt 144 lb (65.3 kg)   SpO2 98%   BMI 20.66 kg/m  Wt Readings from Last 3 Encounters:  06/12/23 144 lb (65.3 kg)  03/29/23 144 lb (65.3 kg)  02/20/23 150 lb 3.2 oz (68.1 kg)    Diabetic Foot Exam - Simple   No data filed    Lab Results  Component Value Date   WBC 9.6 06/12/2023   HGB 13.2 06/12/2023   HCT 39.9 06/12/2023   PLT 207.0 06/12/2023   GLUCOSE 96 06/12/2023   CHOL 91 06/12/2023  TRIG 96.0 06/12/2023   HDL 40.80 06/12/2023   LDLCALC 31 06/12/2023   ALT 20 06/12/2023   AST 25 06/12/2023   NA 136 06/12/2023   K 4.8 06/12/2023   CL 105 06/12/2023   CREATININE 1.46 06/12/2023   BUN 48 (H) 06/12/2023   CO2 21 06/12/2023   TSH 0.94 06/12/2023   PSA 25.40 12/08/2021   INR 1.09 02/04/2018   HGBA1C 5.9 06/12/2023    Lab Results  Component Value Date   TSH 0.94 06/12/2023   Lab Results  Component Value Date   WBC 9.6 06/12/2023   HGB 13.2 06/12/2023   HCT 39.9 06/12/2023   MCV 100.9 (H) 06/12/2023   PLT 207.0 06/12/2023   Lab Results  Component Value Date   NA 136 06/12/2023   K 4.8 06/12/2023   CO2 21 06/12/2023   GLUCOSE 96 06/12/2023   BUN 48 (H) 06/12/2023   CREATININE 1.46 06/12/2023   BILITOT 0.9 06/12/2023   ALKPHOS 50 06/12/2023   AST 25 06/12/2023   ALT 20 06/12/2023   PROT 6.4 06/12/2023   ALBUMIN 4.2 06/12/2023   CALCIUM 9.3 06/12/2023   ANIONGAP 7 11/30/2019   EGFR 39 (L) 04/14/2023   GFR 40.15 (L) 06/12/2023   Lab Results  Component Value Date   CHOL 91 06/12/2023   Lab Results  Component Value Date   HDL 40.80 06/12/2023   Lab Results  Component Value Date   LDLCALC 31 06/12/2023   Lab Results  Component Value Date   TRIG 96.0 06/12/2023   Lab Results  Component Value Date   CHOLHDL 2 06/12/2023   Lab Results  Component Value Date   HGBA1C 5.9 06/12/2023        Assessment & Plan:  HYPERCHOLESTEROLEMIA Assessment & Plan: Encourage heart healthy diet such as MIND or DASH diet, increase exercise, avoid trans fats, simple carbohydrates and processed foods, consider a krill or fish or flaxseed oil cap daily. Tolerating statin.    Orders: -     Lipid panel  Gout, unspecified cause, unspecified chronicity, unspecified site Assessment & Plan: Hydrate and monitor   Orders: -     Uric acid  Essential hypertension Assessment & Plan: Well controlled, no changes to meds. Encouraged heart healthy diet such as the DASH diet and exercise as tolerated.    Orders: -     CBC with Differential/Platelet -     Comprehensive metabolic panel -     TSH  Hyperglycemia Assessment & Plan: hgba1c acceptable, minimize simple carbs. Increase exercise as tolerated.   Orders: -     Hemoglobin A1c  B12 deficiency Assessment & Plan: Supplement and monitor   Orders: -     Vitamin B12 -     Cyanocobalamin  Chronic kidney disease, unspecified CKD stage Assessment & Plan: Hydrate and monitor    Coronary artery disease due to lipid rich plaque Assessment & Plan: Continues to follow with cardiology     Assessment and Plan    Nutrition and Weight Management: Stable weight over the past year. Decreased appetite and smaller, more frequent meals. Discussed the importance of protein intake and maintaining weight. -Encouraged high protein diet with whole fat Greek yogurt, nuts, and seeds. -Continue current eating habits and monitor weight.  Vitamin B12 Deficiency: Positive intrinsic factor, indicating difficulty absorbing B12 through the gut. Currently on monthly B12 shots. -Check B12 levels today before administering the monthly shot. -Continue monthly B12 shots based on lab results.  General Health Maintenance:  Discussed the importance of hydration and sun protection during outdoor activities. -Encouraged to drink extra water or Gatorade before going  outside in the heat. -Advised to avoid outdoor activities during the peak heat of the day.  Follow-up Plans: -Order lab work today to check B12 levels. -Plan for follow-up visit in 4-6 months. -If any unexpected results from the blood work, will contact patient to discuss further.         Danise Edge, MD

## 2023-06-11 NOTE — Assessment & Plan Note (Signed)
Well controlled, no changes to meds. Encouraged heart healthy diet such as the DASH diet and exercise as tolerated.  °

## 2023-06-11 NOTE — Assessment & Plan Note (Signed)
Continues to follow with cardiology.   

## 2023-06-11 NOTE — Assessment & Plan Note (Signed)
Encourage heart healthy diet such as MIND or DASH diet, increase exercise, avoid trans fats, simple carbohydrates and processed foods, consider a krill or fish or flaxseed oil cap daily. Tolerating statin 

## 2023-06-11 NOTE — Assessment & Plan Note (Signed)
hgba1c acceptable, minimize simple carbs. Increase exercise as tolerated.  

## 2023-06-12 ENCOUNTER — Ambulatory Visit (INDEPENDENT_AMBULATORY_CARE_PROVIDER_SITE_OTHER): Payer: Medicare Other | Admitting: Family Medicine

## 2023-06-12 VITALS — BP 110/68 | HR 54 | Temp 98.0°F | Resp 16 | Ht 70.0 in | Wt 144.0 lb

## 2023-06-12 DIAGNOSIS — R739 Hyperglycemia, unspecified: Secondary | ICD-10-CM

## 2023-06-12 DIAGNOSIS — I2510 Atherosclerotic heart disease of native coronary artery without angina pectoris: Secondary | ICD-10-CM | POA: Diagnosis not present

## 2023-06-12 DIAGNOSIS — M109 Gout, unspecified: Secondary | ICD-10-CM | POA: Diagnosis not present

## 2023-06-12 DIAGNOSIS — N189 Chronic kidney disease, unspecified: Secondary | ICD-10-CM

## 2023-06-12 DIAGNOSIS — I10 Essential (primary) hypertension: Secondary | ICD-10-CM

## 2023-06-12 DIAGNOSIS — I2583 Coronary atherosclerosis due to lipid rich plaque: Secondary | ICD-10-CM | POA: Diagnosis not present

## 2023-06-12 DIAGNOSIS — E538 Deficiency of other specified B group vitamins: Secondary | ICD-10-CM

## 2023-06-12 DIAGNOSIS — E7800 Pure hypercholesterolemia, unspecified: Secondary | ICD-10-CM | POA: Diagnosis not present

## 2023-06-12 LAB — CBC WITH DIFFERENTIAL/PLATELET
Basophils Absolute: 0.1 10*3/uL (ref 0.0–0.1)
Basophils Relative: 0.7 % (ref 0.0–3.0)
Eosinophils Absolute: 0.2 10*3/uL (ref 0.0–0.7)
Eosinophils Relative: 1.6 % (ref 0.0–5.0)
HCT: 39.9 % (ref 39.0–52.0)
Hemoglobin: 13.2 g/dL (ref 13.0–17.0)
Lymphocytes Relative: 13.8 % (ref 12.0–46.0)
Lymphs Abs: 1.3 10*3/uL (ref 0.7–4.0)
MCHC: 33 g/dL (ref 30.0–36.0)
MCV: 100.9 fl — ABNORMAL HIGH (ref 78.0–100.0)
Monocytes Absolute: 0.8 10*3/uL (ref 0.1–1.0)
Monocytes Relative: 8.3 % (ref 3.0–12.0)
Neutro Abs: 7.3 10*3/uL (ref 1.4–7.7)
Neutrophils Relative %: 75.6 % (ref 43.0–77.0)
Platelets: 207 10*3/uL (ref 150.0–400.0)
RBC: 3.95 Mil/uL — ABNORMAL LOW (ref 4.22–5.81)
RDW: 13.2 % (ref 11.5–15.5)
WBC: 9.6 10*3/uL (ref 4.0–10.5)

## 2023-06-12 LAB — VITAMIN B12: Vitamin B-12: 408 pg/mL (ref 211–911)

## 2023-06-12 LAB — HEMOGLOBIN A1C: Hgb A1c MFr Bld: 5.9 % (ref 4.6–6.5)

## 2023-06-12 LAB — TSH: TSH: 0.94 u[IU]/mL (ref 0.35–5.50)

## 2023-06-12 MED ORDER — CYANOCOBALAMIN 1000 MCG/ML IJ SOLN
1000.0000 ug | Freq: Once | INTRAMUSCULAR | Status: AC
Start: 2023-06-12 — End: 2023-06-12
  Administered 2023-06-12: 1000 ug via INTRAMUSCULAR

## 2023-06-12 MED ORDER — CYANOCOBALAMIN 1000 MCG/ML IJ SOLN
1000.0000 ug | Freq: Once | INTRAMUSCULAR | Status: DC
Start: 2023-06-12 — End: 2023-06-12

## 2023-06-12 NOTE — Patient Instructions (Signed)
Pernicious Anemia  Pernicious anemia is a condition where your body cannot absorb enough vitamin B12 from your digestive tract. Without vitamin B12, your body is not able to make the red blood cells that you need to carry oxygen in your body. Normally, you can get enough vitamin B12 from eating foods such as meat, poultry, eggs, and dairy products. If you have pernicious anemia, you do not absorb enough vitamin B12 from your diet, and anemia develops over time. When you have anemia, your organs may not work properly, and you may feel very tired. Untreated pernicious anemia can lead to severe symptoms of anemia, including chest pain, heart failure, and permanent nervous system damage. What are the causes? Pernicious anemia is believed to be an autoimmune disease. When you have an autoimmune disease, your body's defense system (immune system) mistakenly attacks normal cells in your body. Normally, your stomach makes a protein called intrinsic factor (IF). This protein helps your body absorb vitamin B12 in your intestines. When your stomach does not make enough IF, your intestines cannot absorb enough vitamin B12. Your stomach may not make enough IF because your immune system attacks the IF or the cells in the stomach that make it. What increases the risk? You are more likely to develop this condition if you: Are older than 87 years of age. Have a family history of pernicious anemia. Are of [place] or [place] descent. Have another type of autoimmune disease. What are the signs or symptoms? Many times there are no symptoms of this condition. Pernicious anemia symptoms may take many years to develop. Symptoms may include: Tiredness. Light-headedness. A sore tongue or a burning sensation on the tongue. A smooth red tongue. Stomach problems such as heartburn, diarrhea, or constipation. Shortness of breath, especially when walking or exercising. Signs of long-term damage to the nervous  system include: Being unsteady while walking. Tingling or numbness of hands and feet. Problems concentrating, confusion, or irritability. Mental health concerns, such as depression, hallucinations, or false beliefs (delusions). Loss of smell. How is this diagnosed? In many cases, anemia may be found after you have a routine blood test. This condition may also be diagnosed based on: Your medical history and symptoms you are having. A physical exam. Blood tests. A procedure to look at your stomach called an endoscopy. How is this treated? This condition may be treated with vitamin B12 replacement. This may include: Injections of vitamin B12. This is the most common treatment. Using a spray that you breathe in through your nose (nasal spray). A vitamin B12 nasal spray may be used to treat people who are not able to swallow supplements. Taking a pill by mouth that contains a large dose of vitamin B12. Long-term monitoring and regular visits to a health care provider. If the condition is found and treatment is started in the early stages, most people do not develop complications. Treatment reverses the condition and prevents future anemia, but it must be continued for life. Having pernicious anemia also puts you at higher risk for stomach cancer. Follow these instructions at home: Take over-the-counter and prescription medicines only as told by your health care provider. Return to your normal activities as told by your health care provider. Ask your health care provider what activities are safe for you. Eat a healthy diet that includes plenty of vegetables, fruits, whole grains, low-fat dairy products, and lean protein. Keep all follow-up visits. You will need to have your blood level of vitamin B12 checked regularly. Contact a  health care provider if: You develop new symptoms. Your symptoms return or get worse after treatment. You have stomach pain. You have symptoms of an infection, such as  a fever. You have trouble swallowing. Get help right away if: You have chest pain. You have a rapid or abnormal heartbeat. You have dizziness or you faint. You have trouble breathing. You have pain, swelling, or redness in an arm or leg. You vomit blood. These symptoms may be an emergency. Get help right away. Call 911. Do not wait to see if the symptoms will go away. Do not drive yourself to the hospital. Summary Pernicious anemia happens when your body cannot make enough red blood cells because it cannot absorb enough vitamin B12. This condition is usually caused by an autoimmune disease. Symptoms may take years to develop. Treatment of this condition includes vitamin B12 replacement for life. Contact your health care provider if you develop new symptoms or if your symptoms return or get worse. Keep all follow-up visits for treatment and monitoring of your condition. This information is not intended to replace advice given to you by your health care provider. Make sure you discuss any questions you have with your health care provider. Document Revised: 03/07/2022 Document Reviewed: 03/07/2022 Elsevier Patient Education  2024 [employer].

## 2023-06-13 LAB — COMPREHENSIVE METABOLIC PANEL
ALT: 20 U/L (ref 0–53)
AST: 25 U/L (ref 0–37)
Albumin: 4.2 g/dL (ref 3.5–5.2)
Alkaline Phosphatase: 50 U/L (ref 39–117)
BUN: 48 mg/dL — ABNORMAL HIGH (ref 6–23)
CO2: 21 mEq/L (ref 19–32)
Calcium: 9.3 mg/dL (ref 8.4–10.5)
Chloride: 105 mEq/L (ref 96–112)
Creatinine, Ser: 1.46 mg/dL (ref 0.40–1.50)
GFR: 40.15 mL/min — ABNORMAL LOW (ref >60.00)
Glucose, Bld: 96 mg/dL (ref 70–99)
Potassium: 4.8 mEq/L (ref 3.5–5.1)
Sodium: 136 mEq/L (ref 135–145)
Total Bilirubin: 0.9 mg/dL (ref 0.2–1.2)
Total Protein: 6.4 g/dL (ref 6.0–8.3)

## 2023-06-13 LAB — LIPID PANEL
Cholesterol: 91 mg/dL (ref 0–200)
HDL: 40.8 mg/dL (ref >39.00)
LDL Cholesterol: 31 mg/dL (ref 0–99)
NonHDL: 50.16
Total CHOL/HDL Ratio: 2
Triglycerides: 96 mg/dL (ref 0.0–149.0)
VLDL: 19.2 mg/dL (ref 0.0–40.0)

## 2023-06-13 LAB — URIC ACID: Uric Acid, Serum: 6.6 mg/dL (ref 4.0–7.8)

## 2023-06-22 ENCOUNTER — Other Ambulatory Visit: Payer: Self-pay | Admitting: Family Medicine

## 2023-06-25 ENCOUNTER — Other Ambulatory Visit: Payer: Self-pay | Admitting: Family Medicine

## 2023-07-04 ENCOUNTER — Telehealth: Payer: Self-pay | Admitting: Cardiology

## 2023-07-04 NOTE — Telephone Encounter (Signed)
Spoke with pt regarding potassium chloride, per chart pt was told to stop this medication on 5/10 after labs showed that he had elevated potassium levels. Pt recalls stopping this medication, however he had a reminder sent to him stating that it is time to refill this medication and that is were the confusion came in. Explained that pt can ignore reminder and not fill medication. Pt verbalizes understanding.

## 2023-07-04 NOTE — Telephone Encounter (Signed)
Pt c/o medication issue:  1. Name of Medication:   potassium chloride (KLOR-CON) 10 MEQ tablet   2. How are you currently taking this medication (dosage and times per day)?   3. Are you having a reaction (difficulty breathing--STAT)?   4. What is your medication issue?   Patient wants to confirm this medication was discontinued.

## 2023-07-13 ENCOUNTER — Other Ambulatory Visit: Payer: Self-pay | Admitting: Family Medicine

## 2023-07-13 DIAGNOSIS — E7800 Pure hypercholesterolemia, unspecified: Secondary | ICD-10-CM

## 2023-07-13 DIAGNOSIS — I10 Essential (primary) hypertension: Secondary | ICD-10-CM

## 2023-07-13 DIAGNOSIS — I25700 Atherosclerosis of coronary artery bypass graft(s), unspecified, with unstable angina pectoris: Secondary | ICD-10-CM

## 2023-07-20 ENCOUNTER — Telehealth: Payer: Self-pay | Admitting: Cardiology

## 2023-07-20 DIAGNOSIS — I255 Ischemic cardiomyopathy: Secondary | ICD-10-CM

## 2023-07-20 NOTE — Telephone Encounter (Signed)
Left voicemail to return call. 

## 2023-07-20 NOTE — Telephone Encounter (Signed)
Pt c/o medication issue:  1. Name of Medication: empagliflozin (JARDIANCE) 10 MG TABS tablet  sacubitril-valsartan (ENTRESTO) 24-26 MG 2. How are you currently taking this medication (dosage and times per day)? As written  3. Are you having a reaction (difficulty breathing--STAT)? No  4. What is your medication issue? Patient would like to know if their is a cheaper option for him. Patient would like [place] to call his daughter Harriett Sine in regards to this. Please advise.

## 2023-07-21 NOTE — Telephone Encounter (Signed)
Call to daughter. No voicemail pick up , unable to LM

## 2023-07-24 ENCOUNTER — Encounter: Payer: Self-pay | Admitting: Pharmacist

## 2023-07-24 MED ORDER — SACUBITRIL-VALSARTAN 24-26 MG PO TABS
1.0000 | ORAL_TABLET | Freq: Two times a day (BID) | ORAL | 3 refills | Status: DC
Start: 2023-07-24 — End: 2024-08-13

## 2023-07-24 MED ORDER — EMPAGLIFLOZIN 10 MG PO TABS
10.0000 mg | ORAL_TABLET | Freq: Every day | ORAL | 3 refills | Status: DC
Start: 2023-07-24 — End: 2024-08-13

## 2023-07-24 NOTE — Telephone Encounter (Signed)
Pt qualifies for cardiomyopathy fund through [employer] which would make his [place]ryll [place] and [place] free.  I have enrolled him in foundation with the below grant info:  CARD # [id number]   BIN [id number]   PCN [id number]   GROUP [id number]  Pharmacy will need to process info like a secondary insurance after his Part D plan. I have sent this info to him via mychart message and resent in both rx with grant info as well. Hopefully his daughter will call back so can discuss over the phone as well.

## 2023-07-24 NOTE — Telephone Encounter (Signed)
Call to daughter on home number. No answer nor VM pick up.  Call to mobile number.  LM to call office to discuss

## 2023-07-24 NOTE — Telephone Encounter (Signed)
Unable to contact daughter.  Please advise on other options

## 2023-07-24 NOTE — Progress Notes (Signed)
HPI: FU CAD; s/p CABG in 1979 and 1993, ischemic CM, systolic CHF, AAA, HTN, HL. Patient underwent cardiac catheterization in May of 2014. The left main, LAD, circumflex and RCA were occluded. The LIMA to the LAD was patent and the distal LAD was occluded after the insertion. Saphenous vein graft to the obtuse marginal was occluded. Saphenous vein graft to the acute marginal and PDA had a high-grade lesion prior to insertion into the PDA of 80-90%. Ejection fraction was 25%. PCI: Promus Premier (3.5x12 mm) DES to the [facility]. Repeat catheterization in October 2014 because of recurrent chest pain. The stent placed in the saphenous vein graft to the PDA had mild in-stent restenosis. Medical therapy recommended. Nuclear study 2/17 showed EF 35, inferolateral scar, no ischemia. Carotid Dopplers February 2018 showed less than 50% bilateral stenosis. Most recent echocardiogram April 2022 showed ejection fraction 25 to 30%, trace aortic insufficiency.  Abdominal ultrasound February 2024 showed 4.7 cm abdominal aortic aneurysm.  Since he was last seen, he denies dyspnea, exertional chest pain or syncope.  He does complain of breast pain.  Current Outpatient Medications  Medication Sig Dispense Refill   aspirin EC 81 MG tablet Take 81 mg by mouth every morning.      b complex vitamins capsule Take 1 capsule by mouth daily.     busPIRone (BUSPAR) 5 MG tablet Take 1 tablet (5 mg total) by mouth 2 (two) times daily as needed. 180 tablet 0   Cholecalciferol (VITAMIN D-3 PO) Take 5,000 Units by mouth daily with breakfast.     empagliflozin (JARDIANCE) 10 MG TABS tablet Take 1 tablet (10 mg total) by mouth daily before breakfast. 90 tablet 3   famotidine (PEPCID) 20 MG tablet Take 1 tablet by mouth twice daily 180 tablet 0   finasteride (PROSCAR) 5 MG tablet Takes every third day     folic acid (FOLVITE) 400 MCG tablet Take 400 mcg by mouth 2 (two) times daily.     furosemide (LASIX) 20 MG tablet Take 1 tablet  (20 mg total) by mouth daily. 90 tablet 3   metoprolol succinate (TOPROL-XL) 25 MG 24 hr tablet Take 1/2 (one-half) tablet by mouth once daily 45 tablet 3   Misc Natural Products (OSTEO BI-FLEX ADV JOINT SHIELD) TABS Take 1 tablet by mouth 2 (two) times daily.     Multiple Vitamin (MULTIVITAMIN) tablet Take 1 tablet by mouth daily.     nitroGLYCERIN (NITROSTAT) 0.4 MG SL tablet DISSOLVE ONE TABLET UNDER THE TONGUE EVERY 5 MINUTES AS NEEDED FOR CHEST PAIN.  DO NOT EXCEED A TOTAL OF 3 DOSES IN 15 MINUTES 25 tablet 2   sacubitril-valsartan (ENTRESTO) 24-26 MG Take 1 tablet by mouth 2 (two) times daily. 180 tablet 3   simvastatin (ZOCOR) 40 MG tablet Take 1 tablet (40 mg total) by mouth every evening. 90 tablet 1   spironolactone (ALDACTONE) 25 MG tablet Take 0.5 tablets (12.5 mg total) by mouth daily. 45 tablet 3   No current facility-administered medications for this visit.     Past Medical History:  Diagnosis Date   Abdominal aortic aneurysm (HCC)    a. [place] (1/14):  3.3 x 3.4 cm => f/u 11/2013   Amebic dysentery    AMEBIC DYSENTERY 11/19/2007   Qualifier: History of  By: Kriste Basque MD, Lonzo Cloud    Anemia 03/07/2017   ANXIETY 11/19/2007   Qualifier: Diagnosis of  By: Kriste Basque MD, Lonzo Cloud    Arthritis 03/07/2017   Atherosclerosis of  coronary artery bypass graft with unstable angina pectoris (HCC) 04/19/2013   BACK PAIN, LUMBAR 11/16/2007   Qualifier: Diagnosis of  By: Renaldo Fiddler CMA, Leigh     Benign prostatic hypertrophy    BENIGN PROSTATIC HYPERTROPHY, HX OF 11/16/2007   Qualifier: Diagnosis of  By: Renaldo Fiddler CMA, Leigh     BRBPR (bright red blood per rectum) 11/01/2016   CAD (coronary artery disease)    a. s/p CABG in 1979 and 1993;  b. LHC (5/14):  LM, LAD, CFX and RCA occluded; L-LAD ok, dLAD occluded after insertion of LIMA, S-OM occluded, S-PDA/AM 80-90 => PCI with Promus DES; EF 25%   Cardiomyopathy, ischemic 06/05/2013   Cerumen impaction    Bilateral   Chicken pox as a child   Chronic systolic  CHF (congestive heart failure) (HCC)    COLONIC POLYPS 07/01/2008   Qualifier: Diagnosis of  By: Kriste Basque MD, Scott M    Degenerative joint disease    DEGENERATIVE JOINT DISEASE 11/16/2007   Qualifier: Diagnosis of  By: Renaldo Fiddler CMA, Leigh     Diverticulosis of colon    DIVERTICULOSIS OF COLON 07/01/2008   Qualifier: Diagnosis of  By: Kriste Basque MD, Lonzo Cloud    Double vision 03/07/2017   Essential hypertension 11/16/2007   Qualifier: Diagnosis of  By: Renaldo Fiddler CMA, Leigh     Fingernail abnormalities 03/07/2017   FLANK PAIN, RIGHT 02/03/2010   Qualifier: History of  By: Kriste Basque MD, Lonzo Cloud    GERD (gastroesophageal reflux disease)    GOUT 11/16/2007   Qualifier: Diagnosis of  By: Renaldo Fiddler CMA, Leigh     Hearing loss 11/24/2014   Heart murmur    History of shingles 10/27/2017   Hypercholesterolemia    HYPERCHOLESTEROLEMIA 11/16/2007   Qualifier: Diagnosis of  By: Renaldo Fiddler CMA, Leigh     Ischemic cardiomyopathy    a. echo (09/05/13): EF 35%, diffuse HK worsened distal septal, mid/distal inferior and apical region, grade 1 diastolic dysfunction, mild LAE.     Kidney stone 08/17/2011   Loss of hearing    Lumbar back pain    Measles as a child   Medicare annual wellness visit, subsequent 11/24/2014   Sees Dr Emily Filbert for dermatology Sees Dr Wilson Singer of Urology Sees Dr Jens Som of cardiology Sees Dr Alecia Lemming of Opthamology No further colonoscopies warranted        Mumps as a child   Nephrolithiasis    Pain in joint, lower leg 07/29/2014   PERIPHERAL VASCULAR DISEASE 11/16/2007   Qualifier: Diagnosis of  By: Renaldo Fiddler CMA, Leigh     Peripheral vascular disease (HCC)    Rectal bleeding 03/07/2017   Shingles 07/29/2014   Sun-damaged skin 05/31/2014    Past Surgical History:  Procedure Laterality Date   CORONARY ANGIOPLASTY WITH STENT PLACEMENT  04/18/2013   RCA        CORONARY ARTERY BYPASS GRAFT  1979   x4 SVG-DIAG-LAD, SVG-OM-PDA   CORONARY ARTERY BYPASS GRAFT  1993   Redo x5 by Dr Gerrit Friends; Pollyann Glen, SVG-OM, SVG-AM-PL    Decompressive laminectomy  01/2006   L2 - scarum by Dr. Simonne Come   HEMORRHOID SURGERY     fissure with hemorrhoid corrected at age 51   INGUINAL HERNIA REPAIR  1994   Right by Dr Philippa Chester HERNIA REPAIR  1996   Left by Dr. Gerrit Friends   LEFT HEART CATHETERIZATION WITH CORONARY ANGIOGRAM N/A 09/23/2013   Procedure: LEFT HEART CATHETERIZATION WITH CORONARY ANGIOGRAM;  Surgeon: Micheline Chapman, MD;  Location: [facility]  CATH LAB;  Service: Cardiovascular;  Laterality: N/A;   lens implants     for vision correction   PERCUTANEOUS CORONARY STENT INTERVENTION (PCI-S) N/A 04/18/2013   Procedure: PERCUTANEOUS CORONARY STENT INTERVENTION (PCI-S);  Surgeon: [name], MD;  Location: [facility] CATH LAB;  Service: Cardiovascular;  Laterality: N/A;   TONSILLECTOMY      Social History   Socioeconomic History   Marital status: Widowed    Spouse name: Magda Paganini x 72 years   Number of children: 7   Years of education: Not on file   Highest education level: Not on file  Occupational History   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   Other Topics Concern   Not on file  Social History Narrative   Married   7 children   Social Determinants of Health   Financial Resource Strain: Low Risk  (12/03/2021)   Overall Financial Resource Strain (CARDIA)    Difficulty of Paying Living Expenses: Not hard at all  Food Insecurity: No Food Insecurity (12/03/2021)   Hunger Vital Sign    Worried About  Running Out of Food in the Last Year: Never true    Ran Out of Food in the Last Year: Never true  Transportation Needs: No Transportation Needs (12/03/2021)   PRAPARE - Administrator, Civil Service (Medical): No    Lack of Transportation (Non-Medical): No  Physical Activity: Inactive (12/03/2021)   Exercise Vital Sign    Days of Exercise per Week: 0 days    Minutes of Exercise per Session: 0 min  Stress: No Stress Concern Present (12/03/2021)   [employer] of Occupational Health - Occupational Stress Questionnaire    Feeling of Stress : Not at all  Social Connections: Unknown (11/22/2022)   Received from [facility], [facility]   Social Network    Social Network: Not on file  Intimate Partner Violence: Unknown (11/22/2022)   Received from [facility], [facility]   HITS    Physically Hurt: Not on file    Insult or Talk Down To: Not on file    Threaten Physical Harm: Not on file    Scream or Curse: Not on file    Family History  Problem Relation Age of Onset   Parkinsonism Brother    Diabetes Maternal Grandmother    Depression Daughter    Other Son        4 stents   Heart disease Son    Diabetes Son        type 2   Colon cancer Neg Hx    Esophageal cancer Neg Hx    Rectal cancer Neg Hx    Stomach cancer Neg Hx     ROS: no fevers or chills, productive cough, hemoptysis, dysphasia, odynophagia, melena, hematochezia, dysuria, hematuria, rash, seizure activity, orthopnea, PND, pedal edema, claudication. Remaining systems are negative.  Physical Exam:  Well-developed well-nourished in no acute distress.  Skin is warm and dry.  HEENT is normal.  Neck is supple.  Chest is clear to auscultation with normal expansion.  Cardiovascular exam is regular rate and rhythm.  Abdominal exam nontender or distended. No masses palpated. Extremities show no edema. neuro grossly intact  ECG- personally reviewed  A/P  1 coronary artery disease-patient continues  to do well with no chest pain.  Continue medical therapy with aspirin and statin.  2 hypertension-blood pressure controlled.  Continue present medications.  3 hyperlipidemia-continue statin.  4 ischemic cardiomyopathy-continue Entresto, beta-blocker, Jardiance and Lasix.  He is complaining of breast pain.  Discontinue spironolactone to see if this improves.  Given age we are being conservative.  5 abdominal aortic aneurysm-patient is 89 years old and would not be a candidate for repair regardless of findings.  We have elected not to pursue follow-up imaging and patient agrees with this strategy.  6 palpitations-continue beta-blocker.  7 chronic renal insufficiency-most recent creatinine 1.46 July 15.  [name], MD

## 2023-07-25 NOTE — Telephone Encounter (Signed)
Patient's daughter is returning call.

## 2023-07-25 NOTE — Telephone Encounter (Signed)
Patient daughter given all information and states understanding. She will call if any issues

## 2023-08-01 ENCOUNTER — Telehealth: Payer: Self-pay | Admitting: Family Medicine

## 2023-08-01 DIAGNOSIS — L814 Other melanin hyperpigmentation: Secondary | ICD-10-CM | POA: Diagnosis not present

## 2023-08-01 DIAGNOSIS — L570 Actinic keratosis: Secondary | ICD-10-CM | POA: Diagnosis not present

## 2023-08-01 DIAGNOSIS — C44311 Basal cell carcinoma of skin of nose: Secondary | ICD-10-CM | POA: Diagnosis not present

## 2023-08-01 DIAGNOSIS — Z85828 Personal history of other malignant neoplasm of skin: Secondary | ICD-10-CM | POA: Diagnosis not present

## 2023-08-01 NOTE — Telephone Encounter (Signed)
Called pt was advised he need to spoke with daughter about schedule nurse visit appt For B-12. Pt stated he understand.

## 2023-08-01 NOTE — Telephone Encounter (Signed)
Pt would like to know if he is due for another b12 shot. Please call & advise pt.

## 2023-08-02 ENCOUNTER — Encounter: Payer: Self-pay | Admitting: Cardiology

## 2023-08-02 ENCOUNTER — Ambulatory Visit: Payer: Medicare Other | Attending: Cardiology | Admitting: Cardiology

## 2023-08-02 VITALS — BP 100/58 | HR 53 | Ht 70.0 in | Wt 143.0 lb

## 2023-08-02 DIAGNOSIS — I5022 Chronic systolic (congestive) heart failure: Secondary | ICD-10-CM | POA: Insufficient documentation

## 2023-08-02 DIAGNOSIS — I10 Essential (primary) hypertension: Secondary | ICD-10-CM | POA: Diagnosis not present

## 2023-08-02 DIAGNOSIS — E785 Hyperlipidemia, unspecified: Secondary | ICD-10-CM | POA: Diagnosis not present

## 2023-08-02 DIAGNOSIS — I255 Ischemic cardiomyopathy: Secondary | ICD-10-CM | POA: Diagnosis not present

## 2023-08-02 DIAGNOSIS — I2510 Atherosclerotic heart disease of native coronary artery without angina pectoris: Secondary | ICD-10-CM | POA: Insufficient documentation

## 2023-08-02 DIAGNOSIS — I714 Abdominal aortic aneurysm, without rupture, unspecified: Secondary | ICD-10-CM | POA: Insufficient documentation

## 2023-08-02 NOTE — Patient Instructions (Signed)
Medication Instructions:   DISCONTINUE ALDACTONE.   *If you need a refill on your cardiac medications before your next appointment, please call your pharmacy*   Lab Work:  None ordered.  If you have labs (blood work) drawn today and your tests are completely normal, you will receive your results only by: MyChart Message (if you have MyChart) OR A paper copy in the mail If you have any lab test that is abnormal or we need to change your treatment, we will call you to review the results.   Testing/Procedures:  None ordered.   Follow-Up: At [facility], you and your health needs are our priority.  As part of our continuing mission to provide you with exceptional heart care, we have created designated Provider Care Teams.  These Care Teams include your primary Cardiologist (physician) and Advanced Practice Providers (APPs -  Physician Assistants and Nurse Practitioners) who all work together to provide you with the care you need, when you need it.  We recommend signing up for the patient portal called "MyChart".  Sign up information is provided on this After Visit Summary.  MyChart is used to connect with patients for Virtual Visits (Telemedicine).  Patients are able to view lab/test results, encounter notes, upcoming appointments, etc.  Non-urgent messages can be sent to your provider as well.   To learn more about what you can do with MyChart, go to [email or web address].    Your next appointment:   6 month(s)  Provider:   [name], MD

## 2023-08-09 ENCOUNTER — Ambulatory Visit (INDEPENDENT_AMBULATORY_CARE_PROVIDER_SITE_OTHER): Payer: Medicare Other

## 2023-08-09 DIAGNOSIS — E538 Deficiency of other specified B group vitamins: Secondary | ICD-10-CM | POA: Diagnosis not present

## 2023-08-09 MED ORDER — CYANOCOBALAMIN 1000 MCG/ML IJ SOLN
1000.0000 ug | Freq: Once | INTRAMUSCULAR | Status: AC
Start: 2023-08-09 — End: 2023-08-09
  Administered 2023-08-09: 1000 ug via INTRAMUSCULAR

## 2023-08-09 NOTE — Progress Notes (Signed)
Pt here for monthly B12 injection per Abner Greenspan  B12 given IM, and pt tolerated injection well.  Next B12 injection scheduled for 1 month.

## 2023-08-15 ENCOUNTER — Telehealth: Payer: Self-pay | Admitting: Cardiology

## 2023-08-15 NOTE — Telephone Encounter (Signed)
Pt c/o medication issue:  1. Name of Medication: furosemide (LASIX) 20 MG tablet   2. How are you currently taking this medication (dosage and times per day)?   Take 1 tablet (20 mg total) by mouth daily.    3. Are you having a reaction (difficulty breathing--STAT)? No  4. What is your medication issue? Medicare RX calling to state that the pharmacy is dispensing 180 pills per order and the prescription is wrote incorrectly stating the pt is supposed to take twice daily instead of once daily. Please advise

## 2023-08-16 NOTE — Telephone Encounter (Signed)
Spoke with pt pharmacist and medic[facility], aware the instructions for the furosemide were changed to once daily due to labs.

## 2023-08-31 DIAGNOSIS — R972 Elevated prostate specific antigen [PSA]: Secondary | ICD-10-CM | POA: Diagnosis not present

## 2023-09-06 DIAGNOSIS — R351 Nocturia: Secondary | ICD-10-CM | POA: Diagnosis not present

## 2023-09-06 DIAGNOSIS — N401 Enlarged prostate with lower urinary tract symptoms: Secondary | ICD-10-CM | POA: Diagnosis not present

## 2023-09-06 DIAGNOSIS — R972 Elevated prostate specific antigen [PSA]: Secondary | ICD-10-CM | POA: Diagnosis not present

## 2023-09-08 ENCOUNTER — Ambulatory Visit: Payer: Medicare Other

## 2023-09-08 DIAGNOSIS — E538 Deficiency of other specified B group vitamins: Secondary | ICD-10-CM | POA: Diagnosis not present

## 2023-09-08 MED ORDER — CYANOCOBALAMIN 1000 MCG/ML IJ SOLN
1000.0000 ug | Freq: Once | INTRAMUSCULAR | Status: AC
Start: 2023-09-08 — End: 2023-09-08
  Administered 2023-09-08: 1000 ug via INTRAMUSCULAR

## 2023-09-08 NOTE — Progress Notes (Signed)
Pt here for monthly B12 injection per Dr. Charlett Blake  B12 1080mg given L deltoid IM, and pt tolerated injection well.  Next B12 injection scheduled for 1 month.

## 2023-09-18 ENCOUNTER — Other Ambulatory Visit: Payer: Self-pay | Admitting: Family Medicine

## 2023-10-06 ENCOUNTER — Ambulatory Visit: Payer: Medicare Other

## 2023-10-06 DIAGNOSIS — E538 Deficiency of other specified B group vitamins: Secondary | ICD-10-CM

## 2023-10-06 MED ORDER — CYANOCOBALAMIN 1000 MCG/ML IJ SOLN
1000.0000 ug | Freq: Once | INTRAMUSCULAR | Status: AC
Start: 2023-10-06 — End: 2023-10-06
  Administered 2023-10-06: 1000 ug via INTRAMUSCULAR

## 2023-10-06 NOTE — Progress Notes (Signed)
Daniel Reeves is a 89 y.o. male presents to the office today for B12 injections, per physician's orders. Original order: per Dr. Abner Greenspan Cyanocobalamin (med), 1000 mg/ml (dose),  IM (route) was administered left deltoid (location) today. Patient tolerated injection.  Patient next injection due: in one month, appt made Yes for 11/03/2023.   Wilford Corner

## 2023-11-03 ENCOUNTER — Ambulatory Visit (INDEPENDENT_AMBULATORY_CARE_PROVIDER_SITE_OTHER): Payer: Medicare Other | Admitting: *Deleted

## 2023-11-03 DIAGNOSIS — E538 Deficiency of other specified B group vitamins: Secondary | ICD-10-CM | POA: Diagnosis not present

## 2023-11-03 MED ORDER — CYANOCOBALAMIN 1000 MCG/ML IJ SOLN
1000.0000 ug | Freq: Once | INTRAMUSCULAR | Status: AC
Start: 2023-11-03 — End: 2023-11-03
  Administered 2023-11-03: 1000 ug via INTRAMUSCULAR

## 2023-11-03 NOTE — Progress Notes (Signed)
Daniel Reeves is a 89 y.o. male presents to the office today for Monthly B12: injection, per physician's orders. Cyanocobalamin 1000 mg/ml IM was administered left deltoid today. Patient tolerated injection Patient due for follow up labs/provider appt: Yes. Date due: Feb, 2025, appt made: Appt is already pending. Patient next injection due: 1 month for Monthly B12, appt made Yes  DOD: Tenna Child

## 2023-11-06 ENCOUNTER — Telehealth: Payer: Self-pay | Admitting: Family Medicine

## 2023-11-06 NOTE — Telephone Encounter (Signed)
Patient said that he has 2 notations for the same thing. Check up with Dr. Abner Greenspan for pernicious anemia. Pt said one date is 11/10/23 and one date is 01/11/24 and he is confused when his check up is supposed to take place. Please call him on his home number to clarify which date.

## 2023-11-08 NOTE — Telephone Encounter (Signed)
Called patient back and all the appointments have been discussed and we are on one accord

## 2023-11-11 ENCOUNTER — Other Ambulatory Visit: Payer: Self-pay | Admitting: Cardiology

## 2023-12-06 ENCOUNTER — Ambulatory Visit: Payer: Medicare Other | Admitting: Emergency Medicine

## 2023-12-06 DIAGNOSIS — E538 Deficiency of other specified B group vitamins: Secondary | ICD-10-CM

## 2023-12-06 MED ORDER — CYANOCOBALAMIN 1000 MCG/ML IJ SOLN
1000.0000 ug | Freq: Once | INTRAMUSCULAR | Status: AC
  Administered 2023-12-06: 1000 ug via INTRAMUSCULAR

## 2023-12-06 NOTE — Progress Notes (Signed)
 Patient here for monthly b12 injection per physicians order.  Injection given in left deltoid and patient tolerated well.

## 2023-12-14 ENCOUNTER — Other Ambulatory Visit: Payer: Self-pay | Admitting: Family Medicine

## 2023-12-19 ENCOUNTER — Other Ambulatory Visit: Payer: Self-pay | Admitting: Family Medicine

## 2023-12-23 ENCOUNTER — Other Ambulatory Visit: Payer: Self-pay | Admitting: Family Medicine

## 2023-12-31 NOTE — Assessment & Plan Note (Signed)
 Hydrate and monitor

## 2023-12-31 NOTE — Assessment & Plan Note (Signed)
 Well controlled, no changes to meds. Encouraged heart healthy diet such as the DASH diet and exercise as tolerated.

## 2023-12-31 NOTE — Assessment & Plan Note (Signed)
 Supplement and monitor

## 2023-12-31 NOTE — Assessment & Plan Note (Signed)
Using Buspar prn

## 2024-01-01 ENCOUNTER — Encounter: Payer: Self-pay | Admitting: Family

## 2024-01-01 ENCOUNTER — Ambulatory Visit (INDEPENDENT_AMBULATORY_CARE_PROVIDER_SITE_OTHER): Payer: Medicare Other | Admitting: Family Medicine

## 2024-01-01 VITALS — BP 132/68 | HR 79 | Temp 98.3°F | Resp 18 | Ht 70.0 in | Wt 140.0 lb

## 2024-01-01 DIAGNOSIS — N189 Chronic kidney disease, unspecified: Secondary | ICD-10-CM | POA: Diagnosis not present

## 2024-01-01 DIAGNOSIS — E538 Deficiency of other specified B group vitamins: Secondary | ICD-10-CM

## 2024-01-01 DIAGNOSIS — R002 Palpitations: Secondary | ICD-10-CM | POA: Diagnosis not present

## 2024-01-01 DIAGNOSIS — R739 Hyperglycemia, unspecified: Secondary | ICD-10-CM

## 2024-01-01 DIAGNOSIS — F411 Generalized anxiety disorder: Secondary | ICD-10-CM

## 2024-01-01 DIAGNOSIS — Z23 Encounter for immunization: Secondary | ICD-10-CM | POA: Diagnosis not present

## 2024-01-01 DIAGNOSIS — I10 Essential (primary) hypertension: Secondary | ICD-10-CM | POA: Diagnosis not present

## 2024-01-01 LAB — COMPREHENSIVE METABOLIC PANEL
ALT: 24 U/L (ref 0–53)
AST: 29 U/L (ref 0–37)
Albumin: 4.2 g/dL (ref 3.5–5.2)
Alkaline Phosphatase: 60 U/L (ref 39–117)
BUN: 32 mg/dL — ABNORMAL HIGH (ref 6–23)
CO2: 25 meq/L (ref 19–32)
Calcium: 9.6 mg/dL (ref 8.4–10.5)
Chloride: 104 meq/L (ref 96–112)
Creatinine, Ser: 1.39 mg/dL (ref 0.40–1.50)
GFR: 42.42 mL/min — ABNORMAL LOW (ref >60.00)
Glucose, Bld: 92 mg/dL (ref 70–99)
Potassium: 5.3 meq/L — ABNORMAL HIGH (ref 3.5–5.1)
Sodium: 137 meq/L (ref 135–145)
Total Bilirubin: 1.2 mg/dL (ref 0.2–1.2)
Total Protein: 6.4 g/dL (ref 6.0–8.3)

## 2024-01-01 LAB — CBC WITH DIFFERENTIAL/PLATELET
Basophils Absolute: 0 10*3/uL (ref 0.0–0.1)
Basophils Relative: 0.5 % (ref 0.0–3.0)
Eosinophils Absolute: 0.2 10*3/uL (ref 0.0–0.7)
Eosinophils Relative: 2.6 % (ref 0.0–5.0)
HCT: 40.6 % (ref 39.0–52.0)
Hemoglobin: 13.6 g/dL (ref 13.0–17.0)
Lymphocytes Relative: 18.4 % (ref 12.0–46.0)
Lymphs Abs: 1.4 10*3/uL (ref 0.7–4.0)
MCHC: 33.4 g/dL (ref 30.0–36.0)
MCV: 98 fL (ref 78.0–100.0)
Monocytes Absolute: 0.6 10*3/uL (ref 0.1–1.0)
Monocytes Relative: 7.6 % (ref 3.0–12.0)
Neutro Abs: 5.3 10*3/uL (ref 1.4–7.7)
Neutrophils Relative %: 70.9 % (ref 43.0–77.0)
Platelets: 201 10*3/uL (ref 150.0–400.0)
RBC: 4.15 Mil/uL — ABNORMAL LOW (ref 4.22–5.81)
RDW: 13.8 % (ref 11.5–15.5)
WBC: 7.5 10*3/uL (ref 4.0–10.5)

## 2024-01-01 LAB — LIPID PANEL
Cholesterol: 100 mg/dL (ref 0–200)
HDL: 45.6 mg/dL (ref >39.00)
LDL Cholesterol: 36 mg/dL (ref 0–99)
NonHDL: 54.13
Total CHOL/HDL Ratio: 2
Triglycerides: 93 mg/dL (ref 0.0–149.0)
VLDL: 18.6 mg/dL (ref 0.0–40.0)

## 2024-01-01 LAB — HEMOGLOBIN A1C: Hgb A1c MFr Bld: 5.8 % (ref 4.6–6.5)

## 2024-01-01 LAB — VITAMIN B12: Vitamin B-12: 722 pg/mL (ref 211–911)

## 2024-01-01 LAB — TSH: TSH: 1.69 u[IU]/mL (ref 0.35–5.50)

## 2024-01-01 NOTE — Patient Instructions (Addendum)
Prevnar 20 once Tetanus if injured or in 2030 Covid 19 and flu shot annually   Protein every 3-4 hours  Water 10 ounces every 1-2 hours    Preventive Care 65 Years and Older, Male Preventive care refers to lifestyle choices and visits with your health care provider that can promote health and wellness. Preventive care visits are also called wellness exams. What can I expect for my preventive care visit? Counseling During your preventive care visit, your health care provider may ask about your: Medical history, including: Past medical problems. Family medical history. History of falls. Current health, including: Emotional well-being. Home life and relationship well-being. Sexual activity. Memory and ability to understand (cognition). Lifestyle, including: Alcohol, nicotine or tobacco, and drug use. Access to firearms. Diet, exercise, and sleep habits. Work and work Astronomer. Sunscreen use. Safety issues such as seatbelt and bike helmet use. Physical exam Your health care provider will check your: Height and weight. These may be used to calculate your BMI (body mass index). BMI is a measurement that tells if you are at a healthy weight. Waist circumference. This measures the distance around your waistline. This measurement also tells if you are at a healthy weight and may help predict your risk of certain diseases, such as type 2 diabetes and high blood pressure. Heart rate and blood pressure. Body temperature. Skin for abnormal spots. What immunizations do I need?  Vaccines are usually given at various ages, according to a schedule. Your health care provider will recommend vaccines for you based on your age, medical history, and lifestyle or other factors, such as travel or where you work. What tests do I need? Screening Your health care provider may recommend screening tests for certain conditions. This may include: Lipid and cholesterol levels. Diabetes screening. This  is done by checking your blood sugar (glucose) after you have not eaten for a while (fasting). Hepatitis C test. Hepatitis B test. HIV (human immunodeficiency virus) test. STI (sexually transmitted infection) testing, if you are at risk. Lung cancer screening. Colorectal cancer screening. Prostate cancer screening. Abdominal aortic aneurysm (AAA) screening. You may need this if you are a current or former smoker. Talk with your health care provider about your test results, treatment options, and if necessary, the need for more tests. Follow these instructions at home: Eating and drinking  Eat a diet that includes fresh fruits and vegetables, whole grains, lean protein, and low-fat dairy products. Limit your intake of foods with high amounts of sugar, saturated fats, and salt. Take vitamin and mineral supplements as recommended by your health care provider. Do not drink alcohol if your health care provider tells you not to drink. If you drink alcohol: Limit how much you have to 0-2 drinks a day. Know how much alcohol is in your drink. In the [place] one drink equals one 12 oz bottle of beer (355 mL), one 5 oz glass of wine (148 mL), or one 1 oz glass of hard liquor (44 mL). Lifestyle Brush your teeth every morning and night with fluoride toothpaste. Floss one time each day. Exercise for at least 30 minutes 5 or more days each week. Do not use any products that contain nicotine or tobacco. These products include cigarettes, chewing tobacco, and vaping devices, such as e-cigarettes. If you need help quitting, ask your health care provider. Do not use drugs. If you are sexually active, practice safe sex. Use a condom or other form of protection to prevent STIs. Take aspirin only as told by  your health care provider. Make sure that you understand how much to take and what form to take. Work with your health care provider to find out whether it is safe and beneficial for you to take aspirin  daily. Ask your health care provider if you need to take a cholesterol-lowering medicine (statin). Find healthy ways to manage stress, such as: Meditation, yoga, or listening to music. Journaling. Talking to a trusted person. Spending time with friends and family. Safety Always wear your seat belt while driving or riding in a vehicle. Do not drive: If you have been drinking alcohol. Do not ride with someone who has been drinking. When you are tired or distracted. While texting. If you have been using any mind-altering substances or drugs. Wear a helmet and other protective equipment during sports activities. If you have firearms in your house, make sure you follow all gun safety procedures. Minimize exposure to UV radiation to reduce your risk of skin cancer. What's next? Visit your health care provider once a year for an annual wellness visit. Ask your health care provider how often you should have your eyes and teeth checked. Stay up to date on all vaccines. This information is not intended to replace advice given to you by your health care provider. Make sure you discuss any questions you have with your health care provider. Document Revised: 05/12/2021 Document Reviewed: 05/12/2021 Elsevier Patient Education  2024 [employer].

## 2024-01-01 NOTE — Progress Notes (Signed)
Subjective:    Patient ID: Daniel Reeves, male    DOB: Jun 01, 1924, 89 y.o.   MRN: [id number]  Chief Complaint  Patient presents with  . Annual Exam    A and B    HPI Discussed the use of AI scribe software for clinical note transcription with the patient, who gave verbal consent to proceed.  History of Present Illness   The patient reports waking up three times a night to urinate, which is disrupting sleep some. The patient denies any incontinence, dysuria, or hematuria.  In addition to the urinary symptoms, the patient has been experiencing irregular heartbeats or palpitations for the past two weeks. The patient describes the sensation as a "gurgling" or "vibration" to the left of the center of his chest. The episodes occur without any apparent trigger, last for minutes, and resolve on their own. The patient denies any associated symptoms such as chest pain, shortness of breath, fatigue, or sweating. after further discussion he endorses a sense of irregular heart beats.  The patient denies any new medications or changes in his medication regimen. The patient is active and spends time doing yard work.        Past Medical History:  Diagnosis Date  . Abdominal aortic aneurysm (HCC)    a. [place] (1/14):  3.3 x 3.4 cm => f/u 11/2013  . Amebic dysentery   . AMEBIC DYSENTERY 11/19/2007   Qualifier: History of  By: Kriste Basque MD, Lonzo Cloud   . Anemia 03/07/2017  . ANXIETY 11/19/2007   Qualifier: Diagnosis of  By: Kriste Basque MD, Lonzo Cloud   . Arthritis 03/07/2017  . 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 BACK PAIN, LUMBAR 11/16/2007   Qualifier: Diagnosis of  By: Renaldo Fiddler CMA, Leigh    . Benign prostatic hypertrophy   . BENIGN PROSTATIC HYPERTROPHY, HX OF 11/16/2007   Qualifier: Diagnosis of  By: Renaldo Fiddler CMA, Leigh    . BRBPR (bright red blood per rectum) 11/01/2016  . CAD (coronary artery disease)    a. s/p CABG in 1979 and 1993;  b. LHC  (5/14):  LM, LAD, CFX and RCA occluded; L-LAD ok, dLAD occluded after insertion of LIMA, S-OM occluded, S-PDA/AM 80-90 => PCI with Promus DES; EF 25%  . Cardiomyopathy, ischemic 06/05/2013  . Cerumen impaction    Bilateral  . Chicken pox as a child  . Chronic systolic CHF (congestive heart failure) (HCC)   . COLONIC POLYPS 07/01/2008   Qualifier: Diagnosis of  By: Kriste Basque MD, Lonzo Cloud   . Degenerative joint disease   . DEGENERATIVE JOINT DISEASE 11/16/2007   Qualifier: Diagnosis of  By: Renaldo Fiddler CMA, Marliss Czar    . Diverticulosis of colon   . DIVERTICULOSIS OF COLON 07/01/2008   Qualifier: Diagnosis of  By: Kriste Basque MD, Lonzo Cloud   . Double vision 03/07/2017  . Essential hypertension 11/16/2007   Qualifier: Diagnosis of  By: Renaldo Fiddler CMA, Marliss Czar    . Fingernail abnormalities 03/07/2017  . FLANK PAIN, RIGHT 02/03/2010   Qualifier: History of  By: Kriste Basque MD, Lonzo Cloud   . GERD (gastroesophageal reflux disease)   . GOUT 11/16/2007   Qualifier: Diagnosis of  By: Renaldo Fiddler CMA, Marliss Czar    . Hearing loss 11/24/2014  . Heart murmur   . History of shingles 10/27/2017  . Hypercholesterolemia   . HYPERCHOLESTEROLEMIA 11/16/2007   Qualifier: Diagnosis of  By: Renaldo Fiddler CMA, Marliss Czar    . Ischemic cardiomyopathy    a. echo (  09/05/13): EF 35%, diffuse HK worsened distal septal, mid/distal inferior and apical region, grade 1 diastolic dysfunction, mild LAE.    [name] Kidney stone 08/17/2011  . Loss of hearing   . Lumbar back pain   . Measles as a child  . Medicare annual wellness visit, subsequent 11/24/2014   Sees Dr Emily Filbert for dermatology Sees Dr Wilson Singer of Urology Sees Dr Jens Som of cardiology Sees Dr Alecia Lemming of Opthamology No further colonoscopies warranted       . Mumps as a child  . Nephrolithiasis   . Pain in joint, lower leg 07/29/2014  . PERIPHERAL VASCULAR DISEASE 11/16/2007   Qualifier: Diagnosis of  By: Renaldo Fiddler CMA, Marliss Czar    . Peripheral vascular disease (HCC)   . Rectal bleeding 03/07/2017  . Shingles 07/29/2014  . Sun-damaged  skin 05/31/2014    Past Surgical History:  Procedure Laterality Date  . CORONARY ANGIOPLASTY WITH STENT PLACEMENT  04/18/2013   RCA       . CORONARY ARTERY BYPASS GRAFT  1979   x4 SVG-DIAG-LAD, SVG-OM-PDA  . CORONARY ARTERY BYPASS GRAFT  1993   Redo x5 by Dr Gerrit Friends; [facility], SVG-OM, SVG-AM-PL  . Decompressive laminectomy  01/2006   L2 - scarum by Dr. Simonne Come  . HEMORRHOID SURGERY     fissure with hemorrhoid corrected at age 11  . INGUINAL HERNIA REPAIR  1994   Right by Dr Gerrit Friends  . INGUINAL HERNIA REPAIR  1996   Left by Dr. Gerrit Friends  . LEFT HEART CATHETERIZATION WITH CORONARY ANGIOGRAM N/A 09/23/2013   Procedure: LEFT HEART CATHETERIZATION WITH CORONARY ANGIOGRAM;  Surgeon: Micheline Chapman, MD;  Location[facility] C[facility] LAB;  Service: Cardiovascular;  Laterality: N/A;  . lens implants     for vision correction  . PERCUTANEOUS CORONARY STENT INTERVENTION (PCI-S) N/A 04/18/2013   Procedure: PERCUTANEOUS CORONARY STENT INTERVENTION (PCI-S);  Surgeon: [name], MD;  Location: [facility] CATH LAB;  Service: Cardiovascular;  Laterality: N/A;  . TONSILLECTOMY      Family History  Problem Relation Age of Onset  . Parkinsonism Brother   . Diabetes Maternal Grandmother   . Depression Daughter   . Other Son        4 stents  . Heart disease Son   . Diabetes Son        type 2  . Colon cancer Neg Hx   . Esophageal cancer Neg Hx   . Rectal cancer Neg Hx   . Stomach cancer Neg Hx     Social History   Socioeconomic History  . Marital status: Widowed    Spouse name: Magda Paganini x 72 years  . Number of children: 7  . Years of education: Not on file  . Highest education level: Master's degree (e.g., MA, MS, MEng, MEd, MSW, MBA)  Occupational History  . 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 Smoking status: Former    Current packs/day: 0.00    Types: Cigarettes    Quit date: 11/28/1944    Years since quitting: 79.1  . Smokeless tobacco: Never  Vaping Use   . Vaping status: Never Used  Substance and Sexual Activity  . Alcohol use: Yes    Alcohol/week: 2.0 standard drinks of alcohol    Types: 2 Standard drinks or equivalent per week    Comment: occasional alcohol use  . Drug use: No  . Sexual activity: Not Currently    Comment: lives with wife, no dietary restrictions.   Other Topics  Concern  . Not on file  Social History Narrative   Married   7 children   Social Drivers of Health   Financial Resource Strain: Low Risk  (01/01/2024)   Overall Financial Resource Strain (CARDIA)   . Difficulty of Paying Living Expenses: Not very hard  Food Insecurity: No Food Insecurity (01/01/2024)   Hunger Vital Sign   . Worried About Programme researcher, broadcasting/film/video in the Last Year: Never true   . Ran Out of Food in the Last Year: Never true  Transportation Needs: No Transportation Needs (01/01/2024)   PRAPARE - Transportation   . Lack of Transportation (Medical): No   . Lack of Transportation (Non-Medical): No  Physical Activity: Unknown (01/01/2024)   Exercise Vital Sign   . Days of Exercise per Week: Patient declined   . Minutes of Exercise per Session: Not on file  Stress: Stress Concern Present (01/01/2024)   [employer] of Occupational Health - Occupational Stress Questionnaire   . Feeling of Stress : To some extent  Social Connections: Socially Isolated (01/01/2024)   Social Connection and Isolation Panel [NHANES]   . Frequency of Communication with Friends and Family: More than three times a week   . Frequency of Social Gatherings with Friends and Family: More than three times a week   . Attends Religious Services: Never   . Active Member of Clubs or Organizations: No   . Attends Banker Meetings: Not on file   . Marital Status: Widowed  Intimate Partner Violence: Unknown (11/22/2022)   Received from [facility]   HITS   . Physically Hurt: Not on file   . Insult or Talk Down To: Not on file   . Threaten Physical Harm:  Not on file   . Scream or Curse: Not on file    Outpatient Medications Prior to Visit  Medication Sig Dispense Refill  . aspirin EC 81 MG tablet Take 81 mg by mouth every morning.     [name] b complex vitamins capsule Take 1 capsule by mouth daily.    . busPIRone (BUSPAR) 5 MG tablet Take 1 tablet (5 mg total) by mouth 2 (two) times daily as needed. 180 tablet 0  . Cholecalciferol (VITAMIN D-3 PO) Take 5,000 Units by mouth daily with breakfast.    . empagliflozin (JARDIANCE) 10 MG TABS tablet Take 1 tablet (10 mg total) by mouth daily before breakfast. 90 tablet 3  . famotidine (PEPCID) 20 MG tablet Take 1 tablet by mouth twice daily 180 tablet 0  . finasteride (PROSCAR) 5 MG tablet Takes every third day    . folic acid (FOLVITE) 400 MCG tablet Take 400 mcg by mouth 2 (two) times daily.    . furosemide (LASIX) 20 MG tablet Take 1 tablet (20 mg total) by mouth daily. 90 tablet 3  . metoprolol succinate (TOPROL-XL) 25 MG 24 hr tablet Take 1/2 (one-half) tablet by mouth once daily 45 tablet 2  . Misc Natural Products (OSTEO BI-FLEX ADV JOINT SHIELD) TABS Take 1 tablet by mouth 2 (two) times daily.    . Multiple Vitamin (MULTIVITAMIN) tablet Take 1 tablet by mouth daily.    . nitroGLYCERIN (NITROSTAT) 0.4 MG SL tablet DISSOLVE ONE TABLET UNDER THE TONGUE EVERY 5 MINUTES AS NEEDED FOR CHEST PAIN.  DO NOT EXCEED A TOTAL OF 3 DOSES IN 15 MINUTES 25 tablet 2  . sacubitril-valsartan (ENTRESTO) 24-26 MG Take 1 tablet by mouth 2 (two) times daily. 180 tablet 3  .  simvastatin (ZOCOR) 40 MG tablet Take 1 tablet (40 mg total) by mouth every evening. 90 tablet 1   No facility-administered medications prior to visit.    Allergies  Allergen Reactions  . Baclofen Other (See Comments)    Altered mental status  Other Reaction(s): Delirium  . Celecoxib Other (See Comments)    Altered mental status  . Lisinopril Other (See Comments) and Rash    REACTION: dizziness  . Methocarbamol Rash    REACTION: pt  states "dizzy"  Other Reaction(s): Dizziness  . Other     Other Reaction(s): Other  . Pregabalin Rash    REACTION: pt states "dizzy"  Other Reaction(s): Dizziness  . Ramipril Hives and Rash    REACTION: hives and dizziness  Other Reaction(s): Dizziness  . Tramadol Other (See Comments)    Altered mental status  Other Reaction(s): Delirium  . Allopurinol Other (See Comments)    Review of Systems  Constitutional:  Negative for chills, fever and malaise/fatigue.  HENT:  Negative for congestion and hearing loss.   Eyes:  Negative for discharge.  Respiratory:  Negative for cough, sputum production and shortness of breath.   Cardiovascular:  Positive for palpitations. Negative for chest pain and leg swelling.  Gastrointestinal:  Negative for abdominal pain, blood in stool, constipation, diarrhea, heartburn, nausea and vomiting.  Genitourinary:  Positive for frequency and urgency. Negative for dysuria and hematuria.  Musculoskeletal:  Negative for back pain, falls and myalgias.  Skin:  Negative for rash.  Neurological:  Negative for dizziness, sensory change, loss of consciousness, weakness and headaches.  Endo/Heme/Allergies:  Negative for environmental allergies. Does not bruise/bleed easily.  Psychiatric/Behavioral:  Negative for depression and suicidal ideas. The patient is nervous/anxious. The patient does not have insomnia.        Objective:    Physical Exam Vitals reviewed.  Constitutional:      Appearance: Normal appearance. He is not ill-appearing.  HENT:     Head: Normocephalic and atraumatic.     Nose: Nose normal.  Eyes:     Conjunctiva/sclera: Conjunctivae normal.  Cardiovascular:     Rate and Rhythm: Normal rate.     Pulses: Normal pulses.     Heart sounds: Normal heart sounds. No murmur heard. Pulmonary:     Effort: Pulmonary effort is normal.     Breath sounds: Normal breath sounds. No wheezing.  Abdominal:     Palpations: Abdomen is soft. There is no  mass.     Tenderness: There is no abdominal tenderness.  Musculoskeletal:     Cervical back: Normal range of motion.     Right lower leg: No edema.     Left lower leg: No edema.  Skin:    General: Skin is warm and dry.  Neurological:     General: No focal deficit present.     Mental Status: He is alert and oriented to person, place, and time.  Psychiatric:        Mood and Affect: Mood normal.    BP 132/68 (BP Location: Left Arm, Patient Position: Sitting, Cuff Size: Normal)   Pulse 79   Temp 98.3 F (36.8 C) (Oral)   Resp 18   Ht 5\' 10"  (1.778 m)   Wt 140 lb (63.5 kg)   SpO2 97%   BMI 20.09 kg/m  Wt Readings from Last 3 Encounters:  01/01/24 140 lb (63.5 kg)  08/02/23 143 lb (64.9 kg)  06/12/23 144 lb (65.3 kg)    Diabetic Foot Exam - Simple  No data filed    Lab Results  Component Value Date   WBC 7.5 01/01/2024   HGB 13.6 01/01/2024   HCT 40.6 01/01/2024   PLT 201.0 01/01/2024   GLUCOSE 92 01/01/2024   CHOL 100 01/01/2024   TRIG 93.0 01/01/2024   HDL 45.60 01/01/2024   LDLCALC 36 01/01/2024   ALT 24 01/01/2024   AST 29 01/01/2024   NA 137 01/01/2024   K 5.3 No hemolysis seen (H) 01/01/2024   CL 104 01/01/2024   CREATININE 1.39 01/01/2024   BUN 32 (H) 01/01/2024   CO2 25 01/01/2024   TSH 1.69 01/01/2024   PSA 25.40 12/08/2021   INR 1.09 02/04/2018   HGBA1C 5.8 01/01/2024    Lab Results  Component Value Date   TSH 1.69 01/01/2024   Lab Results  Component Value Date   WBC 7.5 01/01/2024   HGB 13.6 01/01/2024   HCT 40.6 01/01/2024   MCV 98.0 01/01/2024   PLT 201.0 01/01/2024   Lab Results  Component Value Date   NA 137 01/01/2024   K 5.3 No hemolysis seen (H) 01/01/2024   CO2 25 01/01/2024   GLUCOSE 92 01/01/2024   BUN 32 (H) 01/01/2024   CREATININE 1.39 01/01/2024   BILITOT 1.2 01/01/2024   ALKPHOS 60 01/01/2024   AST 29 01/01/2024   ALT 24 01/01/2024   PROT 6.4 01/01/2024   ALBUMIN 4.2 01/01/2024   CALCIUM 9.6 01/01/2024    ANIONGAP 7 11/30/2019   EGFR 39 (L) 04/14/2023   GFR 42.42 (L) 01/01/2024   Lab Results  Component Value Date   CHOL 100 01/01/2024   Lab Results  Component Value Date   HDL 45.60 01/01/2024   Lab Results  Component Value Date   LDLCALC 36 01/01/2024   Lab Results  Component Value Date   TRIG 93.0 01/01/2024   Lab Results  Component Value Date   CHOLHDL 2 01/01/2024   Lab Results  Component Value Date   HGBA1C 5.8 01/01/2024       Assessment & Plan:  B12 deficiency Assessment & Plan: Supplement and monitor   Orders: -     Vitamin B12  Essential hypertension Assessment & Plan: Well controlled, no changes to meds. Encouraged heart healthy diet such as the DASH diet and exercise as tolerated.    Orders: -     Lipid panel -     CBC with Differential/Platelet -     TSH  Chronic kidney disease, unspecified CKD stage Assessment & Plan: Hydrate and monitor   Orders: -     Lipid panel -     Comprehensive metabolic panel  Anxiety state Assessment & Plan: Using Buspar prn   Need for influenza vaccination -     Flu Vaccine Trivalent High Dose (Fluad)  Hyperglycemia -     Lipid panel -     Hemoglobin A1c    Assessment and Plan    Cardiac Arrhythmia Patient reports intermittent chest palpitations, described as a "gurgling" sensation, lasting for minutes with no associated symptoms. No current evidence of arrhythmia on physical examination. -Order a wearable cardiac monitor to capture episodes of palpitations for further evaluation.  Immunizations Patient has received the flu shot and RSV shot. Discussed the benefits of additional vaccinations including Prevnar 20 and a COVID booster. -Consider Prevnar 20 vaccination for additional pneumococcal strain coverage. -Consider COVID booster vaccination at the beginning of each cold and flu season for the next few years.  Tetanus Patient is due for  a tetanus booster in 2030, but advised to get one earlier if  a "dirty" injury occurs. -Administer tetanus booster if a significant injury occurs or in 2030.  General Health Maintenance Patient is due for routine blood work, including sugar and thyroid levels. Patient is also due for a B12 injection next week. -Order routine blood work today. -Continue B12 injections as scheduled.  Follow-up Plan to follow-up in 4-6 months or sooner if symptoms change or escalate.         Danise Edge, MD

## 2024-01-02 ENCOUNTER — Other Ambulatory Visit: Payer: Self-pay | Admitting: Emergency Medicine

## 2024-01-02 ENCOUNTER — Ambulatory Visit: Payer: Medicare Other | Attending: Family Medicine

## 2024-01-02 DIAGNOSIS — R002 Palpitations: Secondary | ICD-10-CM

## 2024-01-02 DIAGNOSIS — N189 Chronic kidney disease, unspecified: Secondary | ICD-10-CM

## 2024-01-02 NOTE — Progress Notes (Unsigned)
 EP to read.

## 2024-01-06 DIAGNOSIS — R002 Palpitations: Secondary | ICD-10-CM

## 2024-01-09 ENCOUNTER — Other Ambulatory Visit: Payer: Medicare Other

## 2024-01-09 ENCOUNTER — Ambulatory Visit: Payer: Medicare Other

## 2024-01-12 ENCOUNTER — Other Ambulatory Visit: Payer: Medicare Other

## 2024-01-12 ENCOUNTER — Other Ambulatory Visit (INDEPENDENT_AMBULATORY_CARE_PROVIDER_SITE_OTHER): Payer: Medicare Other

## 2024-01-12 ENCOUNTER — Ambulatory Visit (INDEPENDENT_AMBULATORY_CARE_PROVIDER_SITE_OTHER): Payer: Medicare Other | Admitting: *Deleted

## 2024-01-12 ENCOUNTER — Encounter: Payer: Self-pay | Admitting: Family Medicine

## 2024-01-12 DIAGNOSIS — N189 Chronic kidney disease, unspecified: Secondary | ICD-10-CM | POA: Diagnosis not present

## 2024-01-12 DIAGNOSIS — E538 Deficiency of other specified B group vitamins: Secondary | ICD-10-CM | POA: Diagnosis not present

## 2024-01-12 LAB — COMPREHENSIVE METABOLIC PANEL
ALT: 16 U/L (ref 0–53)
AST: 23 U/L (ref 0–37)
Albumin: 3.9 g/dL (ref 3.5–5.2)
Alkaline Phosphatase: 56 U/L (ref 39–117)
BUN: 37 mg/dL — ABNORMAL HIGH (ref 6–23)
CO2: 25 meq/L (ref 19–32)
Calcium: 8.8 mg/dL (ref 8.4–10.5)
Chloride: 103 meq/L (ref 96–112)
Creatinine, Ser: 1.48 mg/dL (ref 0.40–1.50)
GFR: 39.34 mL/min — ABNORMAL LOW (ref >60.00)
Glucose, Bld: 110 mg/dL — ABNORMAL HIGH (ref 70–99)
Potassium: 4.3 meq/L (ref 3.5–5.1)
Sodium: 137 meq/L (ref 135–145)
Total Bilirubin: 0.9 mg/dL (ref 0.2–1.2)
Total Protein: 6.1 g/dL (ref 6.0–8.3)

## 2024-01-12 MED ORDER — CYANOCOBALAMIN 1000 MCG/ML IJ SOLN
1000.0000 ug | Freq: Once | INTRAMUSCULAR | Status: AC
Start: 2024-01-12 — End: 2024-01-12
  Administered 2024-01-12: 1000 ug via INTRAMUSCULAR

## 2024-01-12 NOTE — Progress Notes (Signed)
Pt in for monthly B12 injection per PCP order.  Injection given LD without complications.  Next appt 02/09/24.

## 2024-01-13 ENCOUNTER — Other Ambulatory Visit: Payer: Self-pay | Admitting: Family Medicine

## 2024-01-13 DIAGNOSIS — I10 Essential (primary) hypertension: Secondary | ICD-10-CM

## 2024-01-13 DIAGNOSIS — I25700 Atherosclerosis of coronary artery bypass graft(s), unspecified, with unstable angina pectoris: Secondary | ICD-10-CM

## 2024-01-13 DIAGNOSIS — E7800 Pure hypercholesterolemia, unspecified: Secondary | ICD-10-CM

## 2024-01-19 NOTE — Progress Notes (Signed)
 HPI: FU CAD; s/p CABG in 1979 and 1993, ischemic CM, systolic CHF, AAA, HTN, HL. Patient underwent cardiac catheterization in May of 2014. The left main, LAD, circumflex and RCA were occluded. The LIMA to the LAD was patent and the distal LAD was occluded after the insertion. Saphenous vein graft to the obtuse marginal was occluded. Saphenous vein graft to the acute marginal and PDA had a high-grade lesion prior to insertion into the PDA of 80-90%. Ejection fraction was 25%. PCI: Promus Premier (3.5x12 mm) DES to the [facility]. Repeat catheterization in October 2014 because of recurrent chest pain. The stent placed in the saphenous vein graft to the PDA had mild in-stent restenosis. Medical therapy recommended. Nuclear study 2/17 showed EF 35, inferolateral scar, no ischemia. Carotid Dopplers February 2018 showed less than 50% bilateral stenosis. Most recent echocardiogram April 2022 showed ejection fraction 25 to 30%, trace aortic insufficiency.  Abdominal ultrasound February 2024 showed 4.7 cm abdominal aortic aneurysm.  Monitor 3/25 showed sinus rhythm with nonsustained ventricular tachycardia at 3.2 seconds, PVCs and occasional short supraventricular runs. Since he was last seen, he denies dyspnea, chest pain, palpitations or syncope.  Occasional minimal pedal edema.  Current Outpatient Medications  Medication Sig Dispense Refill   aspirin EC 81 MG tablet Take 81 mg by mouth every morning.      b complex vitamins capsule Take 1 capsule by mouth daily.     busPIRone (BUSPAR) 5 MG tablet Take 1 tablet (5 mg total) by mouth 2 (two) times daily as needed. 180 tablet 0   Cholecalciferol (VITAMIN D-3 PO) Take 5,000 Units by mouth daily with breakfast.     empagliflozin (JARDIANCE) 10 MG TABS tablet Take 1 tablet (10 mg total) by mouth daily before breakfast. 90 tablet 3   famotidine (PEPCID) 20 MG tablet Take 1 tablet by mouth twice daily 180 tablet 0   finasteride (PROSCAR) 5 MG tablet Takes every third  day     folic acid (FOLVITE) 400 MCG tablet Take 400 mcg by mouth 2 (two) times daily.     furosemide (LASIX) 20 MG tablet Take 1 tablet (20 mg total) by mouth daily. 90 tablet 3   metoprolol succinate (TOPROL-XL) 25 MG 24 hr tablet Take 1/2 (one-half) tablet by mouth once daily 45 tablet 2   Misc Natural Products (OSTEO BI-FLEX ADV JOINT SHIELD) TABS Take 1 tablet by mouth 2 (two) times daily.     Multiple Vitamin (MULTIVITAMIN) tablet Take 1 tablet by mouth daily.     nitroGLYCERIN (NITROSTAT) 0.4 MG SL tablet DISSOLVE ONE TABLET UNDER THE TONGUE EVERY 5 MINUTES AS NEEDED FOR CHEST PAIN.  DO NOT EXCEED A TOTAL OF 3 DOSES IN 15 MINUTES 25 tablet 2   sacubitril-valsartan (ENTRESTO) 24-26 MG Take 1 tablet by mouth 2 (two) times daily. 180 tablet 3   simvastatin (ZOCOR) 40 MG tablet TAKE 1 TABLET BY MOUTH ONCE DAILY IN THE EVENING 90 tablet 0   No current facility-administered medications for this visit.     Past Medical History:  Diagnosis Date   Abdominal aortic aneurysm (HCC)    a. [place] (1/14):  3.3 x 3.4 cm => f/u 11/2013   Amebic dysentery    AMEBIC DYSENTERY 11/19/2007   Qualifier: History of  By: Kriste Basque MD, Lonzo Cloud    Anemia 03/07/2017   ANXIETY 11/19/2007   Qualifier: Diagnosis of  By: Kriste Basque MD, Lonzo Cloud    Arthritis 03/07/2017   Atherosclerosis of coronary artery bypass  graft with unstable angina pectoris (HCC) 04/19/2013   BACK PAIN, LUMBAR 11/16/2007   Qualifier: Diagnosis of  By: Renaldo Fiddler CMA, Leigh     Benign prostatic hypertrophy    BENIGN PROSTATIC HYPERTROPHY, HX OF 11/16/2007   Qualifier: Diagnosis of  By: Renaldo Fiddler CMA, Leigh     BRBPR (bright red blood per rectum) 11/01/2016   CAD (coronary artery disease)    a. s/p CABG in 1979 and 1993;  b. LHC (5/14):  LM, LAD, CFX and RCA occluded; L-LAD ok, dLAD occluded after insertion of LIMA, S-OM occluded, S-PDA/AM 80-90 => PCI with Promus DES; EF 25%   Cardiomyopathy, ischemic 06/05/2013   Cerumen impaction    Bilateral   Chicken pox  as a child   Chronic systolic CHF (congestive heart failure) (HCC)    COLONIC POLYPS 07/01/2008   Qualifier: Diagnosis of  By: Kriste Basque MD, Scott M    Degenerative joint disease    DEGENERATIVE JOINT DISEASE 11/16/2007   Qualifier: Diagnosis of  By: Renaldo Fiddler CMA, Leigh     Diverticulosis of colon    DIVERTICULOSIS OF COLON 07/01/2008   Qualifier: Diagnosis of  By: Kriste Basque MD, Lonzo Cloud    Double vision 03/07/2017   Essential hypertension 11/16/2007   Qualifier: Diagnosis of  By: Renaldo Fiddler CMA, Leigh     Fingernail abnormalities 03/07/2017   FLANK PAIN, RIGHT 02/03/2010   Qualifier: History of  By: Kriste Basque MD, Lonzo Cloud    GERD (gastroesophageal reflux disease)    GOUT 11/16/2007   Qualifier: Diagnosis of  By: Renaldo Fiddler CMA, Leigh     Hearing loss 11/24/2014   Heart murmur    History of shingles 10/27/2017   Hypercholesterolemia    HYPERCHOLESTEROLEMIA 11/16/2007   Qualifier: Diagnosis of  By: Renaldo Fiddler CMA, Leigh     Ischemic cardiomyopathy    a. echo (09/05/13): EF 35%, diffuse HK worsened distal septal, mid/distal inferior and apical region, grade 1 diastolic dysfunction, mild LAE.     Kidney stone 08/17/2011   Loss of hearing    Lumbar back pain    Measles as a child   Medicare annual wellness visit, subsequent 11/24/2014   Sees Dr Emily Filbert for dermatology Sees Dr Wilson Singer of Urology Sees Dr Jens Som of cardiology Sees Dr Alecia Lemming of Opthamology No further colonoscopies warranted        Mumps as a child   Nephrolithiasis    Pain in joint, lower leg 07/29/2014   PERIPHERAL VASCULAR DISEASE 11/16/2007   Qualifier: Diagnosis of  By: Renaldo Fiddler CMA, Leigh     Peripheral vascular disease (HCC)    Rectal bleeding 03/07/2017   Shingles 07/29/2014   Sun-damaged skin 05/31/2014    Past Surgical History:  Procedure Laterality Date   CORONARY ANGIOPLASTY WITH STENT PLACEMENT  04/18/2013   RCA        CORONARY ARTERY BYPASS GRAFT  1979   x4 SVG-DIAG-LAD, SVG-OM-PDA   CORONARY ARTERY BYPASS GRAFT  1993   Redo x5 by Dr Gerrit Friends;  Pollyann [place], SVG-OM, SVG-AM-PL   Decompressive laminectomy  01/2006   L2 - scarum by Dr. Simonne Come   HEMORRHOID SURGERY     fissure with hemorrhoid corrected at age 33   INGUINAL HERNIA REPAIR  1994   Right by Dr Philippa Chester HERNIA REPAIR  1996   Left by Dr. Gerrit Friends   LEFT HEART CATHETERIZATION WITH CORONARY ANGIOGRAM N/A 09/23/2013   Procedure: LEFT HEART CATHETERIZATION WITH CORONARY ANGIOGRAM;  Surgeon: Micheline Chapman, MD;  Location: [facility] CATH LAB;  Service: Cardiovascular;  Laterality: N/A;   lens implants     for vision correction   PERCUTANEOUS CORONARY STENT INTERVENTION (PCI-S) N/A 04/18/2013   Procedure: PERCUTANEOUS CORONARY STENT INTERVENTION (PCI-S);  Surgeon: [name], MD;  Location: [facility] CATH LAB;  Service: Cardiovascular;  Laterality: N/A;   TONSILLECTOMY      Social History   Socioeconomic History   Marital status: Widowed    Spouse name: Magda Paganini x 72 years   Number of children: 7   Years of education: Not on file   Highest education level: Master's degree (e.g., MA, MS, MEng, MEd, MSW, MBA)  Occupational History   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   Other Topics Concern   Not on file  Social History Narrative   Married   7 children   Social Drivers of Health   Financial Resource Strain: Low Risk  (01/01/2024)   Overall Financial Resource Strain (CARDIA)    Difficulty of Paying Living Expenses: Not very hard  Food Insecurity: No Food  Insecurity (01/01/2024)   Hunger Vital Sign    Worried About Running Out of Food in the Last Year: Never true    Ran Out of Food in the Last Year: Never true  Transportation Needs: No Transportation Needs (01/01/2024)   PRAPARE - Administrator, Civil Service (Medical): No    Lack of Transportation (Non-Medical): No  Physical Activity: Unknown (01/01/2024)   Exercise Vital Sign    Days of Exercise per Week: Patient declined    Minutes of Exercise per Session: Not on file  Stress: Stress Concern Present (01/01/2024)   [employer] of Occupational Health - Occupational Stress Questionnaire    Feeling of Stress : To some extent  Social Connections: Socially Isolated (01/01/2024)   Social Connection and Isolation Panel [NHANES]    Frequency of Communication with Friends and Family: More than three times a week    Frequency of Social Gatherings with Friends and Family: More than three times a week    Attends Religious Services: Never    Database administrator or Organizations: No    Attends Banker Meetings: Not on file    Marital Status: Widowed  Intimate Partner Violence: Unknown (11/22/2022)   Received from [facility]   HITS    Physically Hurt: Not on file    Insult or Talk Down To: Not on file    Threaten Physical Harm: Not on file    Scream or Curse: Not on file    Family History  Problem Relation Age of Onset   Parkinsonism Brother    Diabetes Maternal Grandmother    Depression Daughter    Other Son        4 stents   Heart disease Son    Diabetes Son        type 2  Colon cancer Neg Hx    Esophageal cancer Neg Hx    Rectal cancer Neg Hx    Stomach cancer Neg Hx     ROS: no fevers or chills, productive cough, hemoptysis, dysphasia, odynophagia, melena, hematochezia, dysuria, hematuria, rash, seizure activity, orthopnea, PND, pedal edema, claudication. Remaining systems are negative.  Physical Exam: Well-developed well-nourished  in no acute distress.  Skin is warm and dry.  HEENT is normal.  Neck is supple.  Chest is clear to auscultation with normal expansion.  Cardiovascular exam is regular rate and rhythm.  Abdominal exam nontender or distended. No masses palpated. Extremities show trace edema. neuro grossly intact  EKG Interpretation Date/Time:  Wednesday January 31 2024 11:18:05 EST Ventricular Rate:  47 PR Interval:  212 QRS Duration:  140 QT Interval:  498 QTC Calculation: 440 R Axis:   106  Text Interpretation: Sinus bradycardia with 1st degree A-V block Rightward axis Left bundle branch block Confirmed by [name] ([id number]) on 01/31/2024 11:20:10 AM    A/P  1 coronary artery disease-patient continues to do well from a symptomatic standpoint.  Continue aspirin and statin.  2 ischemic cardiomyopathy-continue Entresto, Jardiance, beta-blocker and Lasix.  Spironolactone was discontinued previously due to breast pain.  Patient is being treated conservatively given age.  3 hypertension-patient's blood pressure is controlled today.  Continue present medical regimen.  4 hyperlipidemia-continue statin.  5 abdominal aortic aneurysm-patient is nearing 89 years of age.  I do not think he would be a candidate for repair regardless of findings on follow-up imaging.  We have therefore elected to be conservative and not pursue additional studies. Pt in agreement.   6 palpitations-controlled.  Continue beta-blocker.  7 history of chronic renal insufficiency-laboratories every 14 2025 personally reviewed and showed BUN 37, creatinine 1.48.    [name], MD

## 2024-01-24 ENCOUNTER — Telehealth: Payer: Self-pay | Admitting: Family Medicine

## 2024-01-24 NOTE — Telephone Encounter (Signed)
 Copied from CRM [id number]. Topic: Appointments - Scheduling Inquiry for Clinic >> Jan 24, 2024 11:07 AM Mackie Pai E wrote: Reason for CRM: Patient's daughter called in to reschedule her father's B12 injection that is on March 14th. Could not reschedule a lab visit due to an error, patient's daughter was wanting Monday, March 17th, but confirmed with CAL they do not do these visits on Mondays. Tried contacting daughter back 2x and did not answer. Callback number for daughter is [phone] to reschedule.

## 2024-01-26 DIAGNOSIS — R002 Palpitations: Secondary | ICD-10-CM | POA: Diagnosis not present

## 2024-01-29 DIAGNOSIS — L570 Actinic keratosis: Secondary | ICD-10-CM | POA: Diagnosis not present

## 2024-01-29 DIAGNOSIS — C44212 Basal cell carcinoma of skin of right ear and external auricular canal: Secondary | ICD-10-CM | POA: Diagnosis not present

## 2024-01-29 DIAGNOSIS — Z85828 Personal history of other malignant neoplasm of skin: Secondary | ICD-10-CM | POA: Diagnosis not present

## 2024-01-29 DIAGNOSIS — D692 Other nonthrombocytopenic purpura: Secondary | ICD-10-CM | POA: Diagnosis not present

## 2024-01-29 DIAGNOSIS — L814 Other melanin hyperpigmentation: Secondary | ICD-10-CM | POA: Diagnosis not present

## 2024-01-29 DIAGNOSIS — L821 Other seborrheic keratosis: Secondary | ICD-10-CM | POA: Diagnosis not present

## 2024-01-31 ENCOUNTER — Ambulatory Visit: Payer: Medicare Other | Attending: Cardiology | Admitting: Cardiology

## 2024-01-31 ENCOUNTER — Encounter: Payer: Self-pay | Admitting: Cardiology

## 2024-01-31 VITALS — BP 134/60 | HR 47 | Ht 70.0 in | Wt 140.0 lb

## 2024-01-31 DIAGNOSIS — R002 Palpitations: Secondary | ICD-10-CM | POA: Diagnosis not present

## 2024-01-31 DIAGNOSIS — E785 Hyperlipidemia, unspecified: Secondary | ICD-10-CM | POA: Diagnosis not present

## 2024-01-31 DIAGNOSIS — I2510 Atherosclerotic heart disease of native coronary artery without angina pectoris: Secondary | ICD-10-CM | POA: Insufficient documentation

## 2024-01-31 DIAGNOSIS — I714 Abdominal aortic aneurysm, without rupture, unspecified: Secondary | ICD-10-CM | POA: Insufficient documentation

## 2024-01-31 DIAGNOSIS — I255 Ischemic cardiomyopathy: Secondary | ICD-10-CM | POA: Insufficient documentation

## 2024-01-31 DIAGNOSIS — I5022 Chronic systolic (congestive) heart failure: Secondary | ICD-10-CM | POA: Diagnosis not present

## 2024-01-31 NOTE — Patient Instructions (Signed)
   Follow-Up: At [facility], you and your health needs are our priority.  As part of our continuing mission to provide you with exceptional heart care, we have created designated Provider Care Teams.  These Care Teams include your primary Cardiologist (physician) and Advanced Practice Providers (APPs -  Physician Assistants and Nurse Practitioners) who all work together to provide you with the care you need, when you need it.    Your next appointment:   6 month(s)  Provider:   [name], MD

## 2024-02-07 ENCOUNTER — Telehealth: Payer: Self-pay | Admitting: Cardiology

## 2024-02-07 DIAGNOSIS — N289 Disorder of kidney and ureter, unspecified: Secondary | ICD-10-CM

## 2024-02-07 DIAGNOSIS — E875 Hyperkalemia: Secondary | ICD-10-CM

## 2024-02-07 NOTE — Telephone Encounter (Signed)
 Pt c/o medication issue:  1. Name of Medication:   furosemide (LASIX) 20 MG tablet   2. How are you currently taking this medication (dosage and times per day)?   3. Are you having a reaction (difficulty breathing--STAT)?   4. What is your medication issue?   Patient wants to confirm instructions for taking this medication.

## 2024-02-07 NOTE — Telephone Encounter (Signed)
 Patient identification verified by 2 forms. Marilynn Rail, RN    Called and spoke to patient  Patient states:   -Has question about Lasix   -Has Rx for lasix that states "take one tablet daily, take one tablet as needed for swelling/weight gain"   -currently he only takes one tablet of lasix daily   -has swelling in feet this morning   -he does not weigh himself daily  Informed patient message sent to Dr. Jens Som to clarify instructions  Patient verbalized understanding, no questions at this time

## 2024-02-08 MED ORDER — FUROSEMIDE 20 MG PO TABS: ORAL_TABLET | ORAL | 3 refills | Status: DC

## 2024-02-08 NOTE — Telephone Encounter (Signed)
 Daniel Bunting, MD  Daniel Reeves, RN24 minutes ago (8:52 AM)    Continue lasix 20 mg daily with an additional 20 mg daily as needed for worsening lower ext edema or weight gain of 3 lbs in one day Daniel Reeves  Patient identification verified by 2 forms. Daniel Rail, RN    Called and spoke to patient  Relayed provider message  Informed patient updated Rx sent to pharmacy  Patient verbalized understanding, no questions at this time

## 2024-02-09 ENCOUNTER — Ambulatory Visit: Payer: Medicare Other

## 2024-02-19 ENCOUNTER — Ambulatory Visit (INDEPENDENT_AMBULATORY_CARE_PROVIDER_SITE_OTHER): Admitting: Neurology

## 2024-02-19 DIAGNOSIS — E538 Deficiency of other specified B group vitamins: Secondary | ICD-10-CM

## 2024-02-19 MED ORDER — CYANOCOBALAMIN 1000 MCG/ML IJ SOLN
1000.0000 ug | Freq: Once | INTRAMUSCULAR | Status: AC
  Administered 2024-02-19: 1000 ug via INTRAMUSCULAR

## 2024-02-19 NOTE — Progress Notes (Signed)
 Patient is here for a vitamin B12 injection per physician orders:  Daniel Canary, MD 06/14/2023  9:21 AM EDT     Labs stable no new concerns, no changes keep up with monthly Vitamin B 12 shots, please make sure he has appointments arranged  Last B12 injection: 01/12/2024.  Denies gastrointestinal problems or dizziness.   B12 injection to left deltoid with no apparent complications.  Scheduled next injection in one month: 03/22/2024 at 11am.

## 2024-03-19 NOTE — Progress Notes (Deleted)
 Daniel Reeves is a 89 y.o. male presents to the office today for Monthly B12 injection, per physician's orders. Original order: 06/14/23: "Labs stable no new concerns, no changes keep up with monthly Vitamin B 12 shots, please make sure he has appointments arranged." Cyanocobalamin  1000 mcg/ml IM was administered *** Deltoid today. Patient tolerated injection. Patient due for follow up labs/provider appt: PENDING 05/12/24.  Patient next injection due: 1 month, appt made {yes/no:[id number]}   DOD: Wanita Gutta, Tierra L

## 2024-03-20 ENCOUNTER — Ambulatory Visit (INDEPENDENT_AMBULATORY_CARE_PROVIDER_SITE_OTHER)

## 2024-03-20 ENCOUNTER — Other Ambulatory Visit: Payer: Self-pay | Admitting: Family Medicine

## 2024-03-20 DIAGNOSIS — E538 Deficiency of other specified B group vitamins: Secondary | ICD-10-CM | POA: Diagnosis not present

## 2024-03-20 MED ORDER — CYANOCOBALAMIN 1000 MCG/ML IJ SOLN
1000.0000 ug | Freq: Once | INTRAMUSCULAR | Status: AC
  Administered 2024-03-20: 1000 ug via INTRAMUSCULAR

## 2024-03-20 NOTE — Progress Notes (Signed)
Pt here for monthly B12 injection per  Abner Greenspan  B12 given IM, and pt tolerated injection well.  Next B12 injection scheduled for

## 2024-03-21 DIAGNOSIS — R972 Elevated prostate specific antigen [PSA]: Secondary | ICD-10-CM | POA: Diagnosis not present

## 2024-03-22 ENCOUNTER — Ambulatory Visit

## 2024-03-29 DIAGNOSIS — R972 Elevated prostate specific antigen [PSA]: Secondary | ICD-10-CM | POA: Diagnosis not present

## 2024-03-29 DIAGNOSIS — R3912 Poor urinary stream: Secondary | ICD-10-CM | POA: Diagnosis not present

## 2024-03-29 DIAGNOSIS — N403 Nodular prostate with lower urinary tract symptoms: Secondary | ICD-10-CM | POA: Diagnosis not present

## 2024-04-01 ENCOUNTER — Other Ambulatory Visit (HOSPITAL_COMMUNITY): Payer: Self-pay | Admitting: Urology

## 2024-04-01 DIAGNOSIS — R972 Elevated prostate specific antigen [PSA]: Secondary | ICD-10-CM

## 2024-04-12 ENCOUNTER — Encounter (HOSPITAL_COMMUNITY)
Admission: RE | Admit: 2024-04-12 | Discharge: 2024-04-12 | Disposition: A | Discharge status: Home / Self Care | Source: Ambulatory Visit | Attending: Urology | Admitting: Urology

## 2024-04-12 DIAGNOSIS — R972 Elevated prostate specific antigen [PSA]: Secondary | ICD-10-CM | POA: Insufficient documentation

## 2024-04-12 DIAGNOSIS — C61 Malignant neoplasm of prostate: Secondary | ICD-10-CM | POA: Insufficient documentation

## 2024-04-12 DIAGNOSIS — I7143 Infrarenal abdominal aortic aneurysm, without rupture: Secondary | ICD-10-CM | POA: Diagnosis not present

## 2024-04-12 MED ORDER — FLOTUFOLASTAT F 18 GALLIUM 296-5846 MBQ/ML IV SOLN
7.5000 | Freq: Once | INTRAVENOUS | Status: AC
  Administered 2024-04-12: 7.5 via INTRAVENOUS

## 2024-04-15 DIAGNOSIS — Z8781 Personal history of (healed) traumatic fracture: Secondary | ICD-10-CM | POA: Diagnosis not present

## 2024-04-15 DIAGNOSIS — M545 Low back pain, unspecified: Secondary | ICD-10-CM | POA: Diagnosis not present

## 2024-04-15 DIAGNOSIS — M5416 Radiculopathy, lumbar region: Secondary | ICD-10-CM | POA: Diagnosis not present

## 2024-04-15 DIAGNOSIS — W19XXXA Unspecified fall, initial encounter: Secondary | ICD-10-CM | POA: Diagnosis not present

## 2024-04-15 DIAGNOSIS — S32020A Wedge compression fracture of second lumbar vertebra, initial encounter for closed fracture: Secondary | ICD-10-CM | POA: Diagnosis not present

## 2024-04-15 DIAGNOSIS — M4150 Other secondary scoliosis, site unspecified: Secondary | ICD-10-CM | POA: Diagnosis not present

## 2024-04-16 ENCOUNTER — Other Ambulatory Visit: Payer: Self-pay | Admitting: Family Medicine

## 2024-04-16 DIAGNOSIS — I10 Essential (primary) hypertension: Secondary | ICD-10-CM

## 2024-04-16 DIAGNOSIS — I25700 Atherosclerosis of coronary artery bypass graft(s), unspecified, with unstable angina pectoris: Secondary | ICD-10-CM

## 2024-04-16 DIAGNOSIS — E7800 Pure hypercholesterolemia, unspecified: Secondary | ICD-10-CM

## 2024-04-17 ENCOUNTER — Ambulatory Visit

## 2024-04-22 ENCOUNTER — Encounter: Payer: Self-pay | Admitting: Family Medicine

## 2024-04-24 ENCOUNTER — Other Ambulatory Visit: Payer: Self-pay | Admitting: Family

## 2024-04-24 MED ORDER — MELOXICAM 7.5 MG PO TABS: 7.5000 mg | ORAL_TABLET | Freq: Every day | ORAL | 1 refills | Status: DC

## 2024-04-27 DIAGNOSIS — M48061 Spinal stenosis, lumbar region without neurogenic claudication: Secondary | ICD-10-CM | POA: Diagnosis not present

## 2024-04-27 DIAGNOSIS — S32020A Wedge compression fracture of second lumbar vertebra, initial encounter for closed fracture: Secondary | ICD-10-CM | POA: Diagnosis not present

## 2024-04-27 DIAGNOSIS — M4807 Spinal stenosis, lumbosacral region: Secondary | ICD-10-CM | POA: Diagnosis not present

## 2024-04-30 ENCOUNTER — Telehealth: Payer: Self-pay | Admitting: Family Medicine

## 2024-04-30 NOTE — Telephone Encounter (Signed)
 Patient's daughter was advised that patient can wait for is appointment on 06/03/24 due to his fracture back.

## 2024-04-30 NOTE — Telephone Encounter (Signed)
 Copied from CRM [id number]. Topic: General - Other >> Apr 30, 2024  2:35 PM Martinique E wrote: Reason for CRM: Patient's daughter, Haskell Linker, called in questioning if they can have patients B12 injection done at the time of his July 7th appointment instead of making a separate appointment. Callback number for daughter is [phone].

## 2024-05-02 ENCOUNTER — Telehealth: Payer: Self-pay | Admitting: Cardiology

## 2024-05-02 ENCOUNTER — Telehealth: Payer: Self-pay

## 2024-05-02 ENCOUNTER — Other Ambulatory Visit (HOSPITAL_COMMUNITY): Payer: Self-pay

## 2024-05-02 NOTE — Telephone Encounter (Signed)
 Pts daughter called  and will let us  know if she has any further trouble.

## 2024-05-02 NOTE — Telephone Encounter (Signed)
 Attempted to enroll in [facility] grant but patient already has an active grant so cost should be $0 with grant. Faxed billing details and instructions to pharmacy.

## 2024-05-02 NOTE — Telephone Encounter (Signed)
 Pt c/o medication issue:  1. Name of Medication:   sacubitril -valsartan  (ENTRESTO ) 24-26 MG  empagliflozin  (JARDIANCE ) 10 MG TABS tablet   2. How are you currently taking this medication (dosage and times per day)?   As prescribed  3. Are you having a reaction (difficulty breathing--STAT)?   4. What is your medication issue?   Daughter Haskell Linker) stated patient is unable to pay for these medications and wants to get assistance getting this medication. Daughter stated patient has 4 days left of medication.

## 2024-05-02 NOTE — Telephone Encounter (Signed)
 Spoke w/ Haskell Linker- Pt's daughter, informed that Dr. Lourdes Roy office will need to complete patient assistance forms since he prescribes those meds, they may even have samples of meds they can give them until paperwork can be completed. Haskell Linker verbalized understanding and states she will contact them.

## 2024-05-02 NOTE — Telephone Encounter (Signed)
 Copied from CRM [id number]. Topic: Clinical - Prescription Issue >> May 02, 2024  2:37 PM [name] wrote: Reason for CRM: Patient daughter Daniel Reeves called for assistance with his medications: sacubitril -valsartan  (ENTRESTO ) 24-26 MG [[id number]] and empagliflozin  (JARDIANCE ) 10 MG TABS tablet [[id number]] - stated that he is unable to get the medication because it's about 460. She is requesting a call back for assistance on getting patient medication because he has about 4 days left of medication. Also said that someone by the name of [name] who was able to assist him getting his medication last time.  Daughter [name]: [phone]

## 2024-05-06 ENCOUNTER — Telehealth: Payer: Self-pay

## 2024-05-06 DIAGNOSIS — S22080A Wedge compression fracture of T11-T12 vertebra, initial encounter for closed fracture: Secondary | ICD-10-CM | POA: Diagnosis not present

## 2024-05-06 DIAGNOSIS — Z8781 Personal history of (healed) traumatic fracture: Secondary | ICD-10-CM | POA: Diagnosis not present

## 2024-05-06 DIAGNOSIS — M8000XA Age-related osteoporosis with current pathological fracture, unspecified site, initial encounter for fracture: Secondary | ICD-10-CM | POA: Diagnosis not present

## 2024-05-06 NOTE — Telephone Encounter (Signed)
   Pre-operative Risk Assessment    Patient Name: Daniel Reeves  DOB: [birth year] MRN: [id number]   Date of last office visit: 01/31/24 with Dr. Audery Blazing Date of next office visit: None   Request for Surgical Clearance    Procedure:  Thoracic Twelve Kyphoplasty  Date of Surgery:  Clearance TBD                                Surgeon:  Dr. Faylene Hoots Surgeon's Group or Practice Name:  [facility]  Phone number:  [phone] Fax number:  [phone]   Type of Clearance Requested:   - Medical  - Pharmacy:  Hold Aspirin  for 7 days   Type of Anesthesia:  Not Indicated   Additional requests/questions:    Kenny Peals   05/06/2024, 5:05 PM

## 2024-05-08 NOTE — Telephone Encounter (Signed)
   Primary Cardiologist: Alexandria Angel, MD  Chart reviewed as part of pre-operative protocol coverage. Given past medical history and time since last visit, based on ACC/AHA guidelines, Tirso Laws Noteboom would be at acceptable risk for the planned procedure without further cardiovascular testing.   Per primary cardiologist, Dr. Audery Blazing, patient may hold aspirin  for 7 days prior to procedure and should resume the day after or as soon as hemodynamically stable.  I will route this recommendation to the requesting party via Epic fax function and remove from pre-op pool.  Please call with questions.  Gerldine Koch, NP-C 05/08/2024, 8:06 AM [address], [address] [place], [place] [address] Office [phone] Fax [phone]

## 2024-05-13 ENCOUNTER — Ambulatory Visit: Payer: Medicare Other | Admitting: Family Medicine

## 2024-05-22 ENCOUNTER — Other Ambulatory Visit: Payer: Self-pay | Admitting: Cardiology

## 2024-05-27 ENCOUNTER — Ambulatory Visit: Payer: Self-pay

## 2024-05-27 ENCOUNTER — Other Ambulatory Visit: Payer: Self-pay | Admitting: Family Medicine

## 2024-05-27 NOTE — Telephone Encounter (Signed)
 FYI Only or Action Required?: FYI only for provider.  Patient was last seen in primary care on 01/01/2024 by Domenica Harlene LABOR, MD. Called Nurse Triage reporting Shoulder Pain. Symptoms began several days ago. Interventions attempted: Prescription medications: Currently on pain medications from back fracture. Symptoms are: unchanged.  Triage Disposition: See PCP When Office is Open (Within 3 Days)  Patient/caregiver understands and will follow disposition?: Yes  **Appt. Scheduled for 05/28/24; he will also keep his 7/7 appt. With PCP**                           Copied from CRM [id number]. Topic: Clinical - Red Word Triage >> May 27, 2024 10:40 AM Charlet HERO wrote: Red Word that prompted transfer to Nurse Triage: Patient is calling about right shoulder hurting wants an xray not sure how he hurt it .  Daughter Inocente is on the line and he is in the background Reason for Disposition  [1] MODERATE pain (e.g., interferes with normal activities) AND [2] present > 3 days  Answer Assessment - Initial Assessment Questions 1. ONSET: When did the pain start?     Last Thursday   2. LOCATION: Where is the pain located?    Right   3. PAIN: How bad is the pain? (Scale 1-10; or mild, moderate, severe)   - MILD (1-3): doesn't interfere with normal activities   - MODERATE (4-7): interferes with normal activities (e.g., work or school) or awakens from sleep   - SEVERE (8-10): excruciating pain, unable to do any normal activities, unable to move arm at all due to pain     5/10   4. WORK OR EXERCISE: Has there been any recent work or exercise that involved this part of the body?     No   5. CAUSE: What do you think is causing the shoulder pain?    Unknown   6. OTHER SYMPTOMS: Do you have any other symptoms? (e.g., neck pain, swelling, rash, fever, numbness, weakness)   No     Recovering from  fractured back from [facility], currently on pain medication. When the  medication wears off, he is in pain.  Protocols used: Shoulder Pain-A-AH

## 2024-05-28 ENCOUNTER — Encounter: Payer: Self-pay | Admitting: Family Medicine

## 2024-05-28 ENCOUNTER — Ambulatory Visit (INDEPENDENT_AMBULATORY_CARE_PROVIDER_SITE_OTHER): Admitting: Family Medicine

## 2024-05-28 ENCOUNTER — Ambulatory Visit: Payer: Self-pay | Admitting: Family Medicine

## 2024-05-28 ENCOUNTER — Ambulatory Visit (HOSPITAL_BASED_OUTPATIENT_CLINIC_OR_DEPARTMENT_OTHER)
Admission: RE | Admit: 2024-05-28 | Discharge: 2024-05-28 | Disposition: A | Discharge status: Home / Self Care | Source: Ambulatory Visit | Attending: Family Medicine | Admitting: Family Medicine

## 2024-05-28 VITALS — BP 112/60 | HR 55 | Temp 97.8°F | Resp 16 | Ht 70.0 in

## 2024-05-28 DIAGNOSIS — M19011 Primary osteoarthritis, right shoulder: Secondary | ICD-10-CM | POA: Diagnosis not present

## 2024-05-28 DIAGNOSIS — M25511 Pain in right shoulder: Secondary | ICD-10-CM | POA: Insufficient documentation

## 2024-05-28 MED ORDER — PREDNISONE 10 MG PO TABS: ORAL_TABLET | ORAL | 0 refills | Status: DC

## 2024-05-28 NOTE — Patient Instructions (Signed)
Shoulder Pain Many things can cause shoulder pain, including: An injury to the shoulder. Overuse of the shoulder. Arthritis. The source of the pain can be: Inflammation. An injury to the shoulder joint. An injury to a tendon, ligament, or bone. Follow these instructions at home: Pay attention to changes in your symptoms. Let your health care provider know about them. Follow these instructions to relieve your pain. If you have a removable sling: Wear the sling as told by your provider. Remove it only as told by your provider. Check the skin around the sling every day. Tell your provider about any concerns. Loosen the sling if your fingers tingle, become numb, or become cold. Keep the sling clean. If the sling is not waterproof: Do not let it get wet. Remove it to shower or bathe. Move your arm as little as possible, but keep your hand moving to prevent swelling. Managing pain, stiffness, and swelling  If told, put ice on the painful area. If you have a removable sling or immobilizer, remove it as told by your provider. Put ice in a plastic bag. Place a towel between your skin and the bag. Leave the ice on for 20 minutes, 2-3 times a day. If your skin turns bright red, remove the ice right away to prevent skin damage. The risk of damage is higher if you cannot feel pain, heat, or cold. Move your fingers often to reduce stiffness and swelling. Squeeze a soft ball or a foam pad as much as possible. This helps to keep the shoulder from swelling. It also helps to strengthen the arm. General instructions Take over-the-counter and prescription medicines only as told by your provider. Exercise may help with pain management. Perform exercises if told by your provider. You may be referred to a physical therapist to help in your recovery process. Keep all follow-up visits in order to avoid any type of permanent shoulder disability or chronic pain problems. Contact a health care provider  if: Your pain is not relieved with medicines. New pain develops in your arm, hand, or fingers. You loosen your sling and your arm, hand, or fingers remain tingly, numb, swollen, or painful. Get help right away if: Your arm, hand, or fingers turn white or blue. This information is not intended to replace advice given to you by your health care provider. Make sure you discuss any questions you have with your health care provider. Document Revised: 06/17/2022 Document Reviewed: 06/17/2022 Elsevier Patient Education  2024 [employer]  

## 2024-05-28 NOTE — Progress Notes (Signed)
 Established Patient Office Visit  Subjective   Patient ID: Daniel Reeves, male    DOB: [birth year]  Age: 89 y.o. MRN: [id number]  Chief Complaint  Patient presents with   Shoulder Pain    Right shoulder, sxs started Thursday night, no falls.     HPI Discussed the use of AI scribe software for clinical note transcription with the patient, who gave verbal consent to proceed.  History of Present Illness Daniel Reeves is a 89 year old male who presents with right shoulder pain. He is accompanied by his caregiver.  The right shoulder pain began on Thursday night, and by Friday morning, he was unable to lift his shoulder fully until after taking his morning medications. The pain is localized to the deltoid region, with no pain in the back or front of the shoulder. He describes a lack of pain rather than numbness in the area below the shoulder. After taking Tylenol , he is able to lift his shoulder without pain.  He took two Tylenol  this morning, which alleviated the shoulder pain. He is also on meloxicam  for a previous back fracture, which occurred right before Memorial Day when he slipped on mud while weeding his yard. The meloxicam  has been effective for his back pain, but the shoulder pain persists once the Tylenol  wears off.  He has a history of a back fracture and has been under the care of a spine and scoliosis specialist, where an MRI was performed. His back pain is well-managed with medication, but the shoulder pain is a new issue.  He is not diabetic but is on Jardiance  for his heart. No muscle spasms are reported in the arm, and he has not seen an orthopedic specialist for this issue before.   Patient Active Problem List   Diagnosis Date Noted   Tympanic membrane perforation, left 11/05/2022   B12 deficiency 11/05/2022   Skin cancer 01/10/2022   Chronic renal disease 03/14/2020   Grief reaction 12/24/2019   Hyperglycemia 01/22/2019   Edema 12/17/2018    Pain of right hip 12/17/2018   Foot pain, right 03/01/2018   Hemorrhoid 10/29/2017   History of shingles 10/27/2017   Anemia 03/07/2017   Medicare annual wellness visit, subsequent 11/24/2014   Hearing loss 11/24/2014   Pain in joint, lower leg 07/29/2014   Sun-damaged skin 05/31/2014   Cardiomyopathy, ischemic 06/05/2013   Kidney stone 08/17/2011   CAD (coronary artery disease)    FLANK PAIN, RIGHT 02/03/2010   Abdominal aortic aneurysm (HCC) 11/04/2009   COLONIC POLYPS 07/01/2008   Anxiety state 11/19/2007   HYPERCHOLESTEROLEMIA 11/16/2007   Gout 11/16/2007   Essential hypertension 11/16/2007   PERIPHERAL VASCULAR DISEASE 11/16/2007   GERD 11/16/2007   Osteoarthritis 11/16/2007   BACK PAIN, LUMBAR 11/16/2007   BPH (benign prostatic hyperplasia) 11/16/2007   Past Medical History:  Diagnosis Date   Abdominal aortic aneurysm (HCC)    a. 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 s/p CABG in 1979 and 1993;  b. LHC (5/14):  LM, LAD, CFX and RCA occluded; L-LAD ok, dLAD occluded after insertion of LIMA, S-OM occluded, S-PDA/AM 80-90 => PCI with Promus DES; EF 25%   Cardiomyopathy, ischemic 06/05/2013   Cerumen impaction    Bilateral   Chicken pox as a child   Chronic systolic CHF (congestive heart failure) (HCC)    COLONIC POLYPS 07/01/2008   Qualifier: Diagnosis of  By: Christi MD,  Scott M    Degenerative joint disease    DEGENERATIVE JOINT DISEASE 11/16/2007   Qualifier: Diagnosis of  By: Latisha CMA, Leigh     Diverticulosis of colon    DIVERTICULOSIS OF COLON 07/01/2008   Qualifier: Diagnosis of  By: Christi MD, Glendia HERO    Double vision 03/07/2017   Essential hypertension 11/16/2007   Qualifier: Diagnosis of  By: Latisha CMA, Leigh     Fingernail abnormalities 03/07/2017   FLANK PAIN, RIGHT 02/03/2010   Qualifier: History of  By: Christi MD, Glendia HERO    GERD (gastroesophageal reflux disease)    GOUT 11/16/2007   Qualifier: Diagnosis of  By: Latisha CMA, Leigh     Hearing loss 11/24/2014   Heart murmur    History of shingles 10/27/2017   Hypercholesterolemia    HYPERCHOLESTEROLEMIA 11/16/2007   Qualifier: Diagnosis of  By: Latisha CMA, Leigh     Ischemic cardiomyopathy    a. echo (09/05/13): EF 35%, diffuse HK worsened distal septal, mid/distal inferior and apical region, grade 1 diastolic dysfunction, mild LAE.     Kidney stone 08/17/2011   Loss of hearing    Lumbar back pain    Measles as a child   Medicare annual wellness visit, subsequent 11/24/2014   Sees Dr Robinson for dermatology Sees Dr Brunetta of Urology Sees Dr Pietro of cardiology Sees Dr Lawrance of Opthamology No further colonoscopies warranted        Mumps as a child   Nephrolithiasis    Pain in joint, lower leg 07/29/2014   PERIPHERAL VASCULAR DISEASE 11/16/2007   Qualifier: Diagnosis of  By: Latisha CMA, Leigh     Peripheral vascular disease (HCC)    Rectal bleeding 03/07/2017   Shingles 07/29/2014   Sun-damaged skin 05/31/2014   Past Surgical History:  Procedure Laterality Date   CORONARY ANGIOPLASTY WITH STENT PLACEMENT  04/18/2013   RCA        CORONARY ARTERY BYPASS GRAFT  1979   x4 SVG-DIAG-LAD, SVG-OM-PDA   CORONARY ARTERY BYPASS GRAFT  1993   Redo x5 by Dr Eletha; ALAN, SVG-OM, SVG-AM-PL   Decompressive laminectomy  01/2006   L2 - scarum by Dr. Amy   HEMORRHOID SURGERY     fissure with  hemorrhoid corrected at age 55   INGUINAL HERNIA REPAIR  1994   Right by Dr Eletha FONTANA HERNIA REPAIR  1996   Left by Dr. Eletha   LEFT HEART CATHETERIZATION WITH CORONARY ANGIOGRAM N/A 09/23/2013   Procedure: LEFT HEART CATHETERIZATION WITH CORONARY ANGIOGRAM;  Surgeon: Ozell JONETTA Fell, MD;  Location: [facility] CATH LAB;  Service: Cardiovascular;  Laterality: N/A;   lens implants     for vision correction   PERCUTANEOUS CORONARY STENT INTERVENTION (PCI-S) N/A 04/18/2013   Procedure: PERCUTANEOUS CORONARY STENT INTERVENTION (PCI-S);  Surgeon: Ozell Fell,  MD;  Location: [facility] CATH LAB;  Service: Cardiovascular;  Laterality: N/A;   TONSILLECTOMY     Social History   Tobacco Use   Smoking status: Former    Current packs/day: 0.00    Types: Cigarettes    Quit date: 11/28/1944    Years since quitting: 79.5   Smokeless tobacco: Never  Vaping Use   Vaping status: Never Used  Substance Use Topics   Alcohol use: Yes    Alcohol/week: 2.0 standard drinks of alcohol    Types: 2 Standard drinks or equivalent per week    Comment: occasional alcohol use   Drug use: No   Social History   Socioeconomic History   Marital status: Widowed    Spouse name: Gustav x 72 years   Number of children: 7   Years of education: Not on file   Highest education level: Master's degree (e.g., MA, MS, MEng, MEd, MSW, MBA)  Occupational History   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   Other  Topics Concern   Not on file  Social History Narrative   Married   7 children   Social Drivers of Health   Financial Resource Strain: Low Risk  (04/15/2024)   Received from [facility]   Overall Financial Resource Strain (CARDIA)    Difficulty of Paying Living Expenses: Not hard at all  Food Insecurity: No Food Insecurity (04/15/2024)   Received from [facility]   Hunger Vital Sign    Within the past 12 months, you worried that your food would run out before you got the money to buy more.: Never true    Within the past 12 months, the food you bought just didn't last and you didn't have money to get more.: Never true  Transportation Needs: No Transportation Needs (04/15/2024)   Received from [facility] - Transportation    Lack of Transportation (Medical): No    Lack of Transportation (Non-Medical): No  Physical Activity: Unknown (01/01/2024)   Exercise Vital Sign    Days of Exercise per Week: Patient declined    Minutes of Exercise per Session: Not on file  Stress: Stress Concern Present (01/01/2024)   [employer] [facility] [employer] - Occupational Stress Questionnaire    Feeling of Stress : To some extent  Social Connections: Socially Isolated (01/01/2024)   Social Connection and Isolation Panel    Frequency of Communication with Friends and Family: More than three times a week    Frequency of Social Gatherings with Friends and Family: More than three times a week    Attends Religious Services: Never    Database administrator or Organizations: No    Attends Banker Meetings: Not on file    Marital Status: Widowed  Intimate Partner Violence: Unknown (11/22/2022)   Received from [facility]   HITS    Physically Hurt:  Not on file    Insult or Talk Down To: Not on file    Threaten Physical Harm: Not on file    Scream or Curse: Not on file   Family Status  Relation Name Status   Brother  Deceased at age 24   MGM  Deceased at age 59    Daughter Mercer Alive       1957   Son Cordella Searles       [id number]   Son Ozell Alive       1960   Mother  Deceased       Drowned   Father  Deceased at age 54       Old age   Sister  Deceased at age 70       Fall from a building   Daughter Devere Alive       1954/ healthy   Son Armed forces training and education officer Alive       1950/ healthy   MGF  Deceased       ran over by a truck   PGM  Deceased       doesn't know   PGF  Deceased       never known   Daughter [place]  Alive       1959/ healthy   Daughter Corporate treasurer       1963/healthy   Neg Hx  (Not Specified)  No partnership data on file   Family History  Problem Relation Age of Onset   Parkinsonism Brother    Diabetes Maternal Grandmother    Depression Daughter    Other Son        4 stents   Heart disease Son    Diabetes Son        type 2   Colon cancer Neg Hx    Esophageal cancer Neg Hx    Rectal cancer Neg Hx    Stomach cancer Neg Hx    Allergies  Allergen Reactions   Baclofen  Other (See Comments)    Altered mental status  Other Reaction(s): Delirium   Celecoxib  Other (See Comments)    Altered mental status   Lisinopril Other (See Comments) and Rash    REACTION: dizziness   Methocarbamol Rash    REACTION: pt states dizzy  Other Reaction(s): Dizziness   Other     Other Reaction(s): Other   Pregabalin Rash    REACTION: pt states dizzy  Other Reaction(s): Dizziness   Ramipril Hives and Rash    REACTION: hives and dizziness  Other Reaction(s): Dizziness   Tramadol  Other (See Comments)    Altered mental status  Other Reaction(s): Delirium   Allopurinol  Other (See Comments)      Review of Systems  Constitutional:  Negative for fever and malaise/fatigue.  HENT:  Negative for congestion.   Eyes:  Negative for blurred vision.  Respiratory:  Negative for cough and shortness of breath.   Cardiovascular:  Negative for chest pain, palpitations and leg swelling.  Gastrointestinal:  Negative for vomiting.  Musculoskeletal:   Positive for joint pain. Negative for back pain and falls.  Skin:  Negative for rash.  Neurological:  Negative for loss of consciousness and headaches.      Objective:     BP 112/60 (BP Location: Left Arm, Patient Position: Sitting, Cuff Size: Normal)   Pulse (!) 55   Temp 97.8 F (36.6 C) (Oral)   Resp 16   Ht 5' 10 (1.778 m)   SpO2 98%   BMI 20.09 kg/m  BP Readings from Last 3 Encounters:  05/28/24 112/60  01/31/24 134/60  01/01/24 132/68   Wt Readings from Last 3 Encounters:  01/31/24 140 lb (63.5 kg)  01/01/24 140 lb (63.5 kg)  08/02/23 143 lb (64.9 kg)   SpO2 Readings from Last 3 Encounters:  05/28/24 98%  01/31/24 94%  01/01/24 97%      Physical Exam Vitals and nursing note reviewed.  Constitutional:      General: He is not in acute distress.    Appearance: Normal appearance. He is well-developed.  HENT:     Head: Normocephalic and atraumatic.   Eyes:     General: No scleral icterus.       Right eye: No discharge.        Left eye: No discharge.    Cardiovascular:     Rate and Rhythm: Normal rate and regular rhythm.     Heart sounds: No murmur heard. Pulmonary:     Effort: Pulmonary effort is normal. No respiratory distress.     Breath sounds: Normal breath sounds.   Musculoskeletal:        General: Tenderness present. No signs of injury. Normal range of motion.     Right shoulder: Tenderness present. No bony tenderness or crepitus. Normal range of motion.     Left shoulder: Normal.     Cervical back: Normal range of motion and neck supple.     Right lower leg: No edema.     Left lower leg: No edema.   Skin:    General: Skin is warm and dry.   Neurological:     Mental Status: He is alert and oriented to person, place, and time.   Psychiatric:        Mood and Affect: Mood normal.        Behavior: Behavior normal.        Thought Content: Thought content normal.        Judgment: Judgment normal.      No results found for any visits on  05/28/24.  Last CBC Lab Results  Component Value Date   WBC 7.5 01/01/2024   HGB 13.6 01/01/2024   HCT 40.6 01/01/2024   MCV 98.0 01/01/2024   MCH 33.0 02/10/2021   RDW 13.8 01/01/2024   PLT 201.0 01/01/2024   Last metabolic panel Lab Results  Component Value Date   GLUCOSE 110 (H) 01/12/2024   NA 137 01/12/2024   K 4.3 01/12/2024   CL 103 01/12/2024   CO2 25 01/12/2024   BUN 37 (H) 01/12/2024   CREATININE 1.48 01/12/2024   GFR 39.34 (L) 01/12/2024   CALCIUM  8.8 01/12/2024   PROT 6.1 01/12/2024   ALBUMIN 3.9 01/12/2024   LABGLOB 2.2 01/10/2019   AGRATIO 1.9 01/10/2019   BILITOT 0.9 01/12/2024   ALKPHOS 56 01/12/2024   AST 23 01/12/2024   ALT 16 01/12/2024   ANIONGAP 7 11/30/2019   Last lipids Lab Results  Component Value Date   CHOL 100 01/01/2024   HDL 45.60 01/01/2024   LDLCALC 36 01/01/2024   TRIG 93.0 01/01/2024   CHOLHDL 2 01/01/2024   Last hemoglobin A1c Lab Results  Component Value Date   HGBA1C 5.8 01/01/2024   Last thyroid  functions Lab Results  Component Value Date   TSH 1.69 01/01/2024   Last vitamin D  Lab Results  Component Value Date   VD25OH 45 03/19/2013   Last vitamin B12 and Folate Lab Results  Component Value Date   VITAMINB12 722 01/01/2024  FOLATE >24.8 10/18/2013      The ASCVD Risk score (Arnett DK, et al., 2019) failed to calculate for the following reasons:   The 2019 ASCVD risk score is only valid for ages 27 to 59    Assessment & Plan:   Problem List Items Addressed This Visit   None Visit Diagnoses       Acute pain of right shoulder    -  Primary   Relevant Medications   predniSONE (DELTASONE) 10 MG tablet   Other Relevant Orders   DG Shoulder Right     Assessment and Plan Assessment & Plan Right Shoulder Pain   He has acute right shoulder pain since Thursday night, localized to the deltoid region and relieved by acetaminophen . He can lift the shoulder, indicating no complete tear. Differential  diagnosis includes muscular strain, hyperextension injury, partial tear, or potential arthritis. Order a right shoulder x-ray. Prescribe a prednisone taper for inflammation and continue acetaminophen  for pain management. Advise taking meloxicam  with food to prevent gastrointestinal upset. Consider referral to orthopedics based on x-ray results.  Vertebral Fracture   He sustained a vertebral fracture before Memorial Day while weeding and is under the care of a spine and scoliosis specialist, awaiting a cement procedure. Back pain is well-managed with meloxicam . The scheduling of the cement procedure is delayed due to the specialist's office transition. Continue management with the spine and scoliosis specialist and monitor the scheduling of the cement procedure.  Follow-up   Discussed follow-up plans for x-ray results and further management based on findings. He is instructed to use MyChart for updates and will be contacted if immediate action is required based on x-ray results. Instruct him to get the x-ray done on the first floor. Review x-ray results and communicate findings via MyChart. Contact if immediate action is required based on x-ray results.    No follow-ups on file.    Kimyetta Flott R Lowne Chase, DO

## 2024-06-02 NOTE — Assessment & Plan Note (Signed)
 hgba1c acceptable, minimize simple carbs. Increase exercise as tolerated.

## 2024-06-02 NOTE — Assessment & Plan Note (Signed)
 Hydrate and monitor

## 2024-06-02 NOTE — Assessment & Plan Note (Signed)
 Supplement and monitor

## 2024-06-02 NOTE — Assessment & Plan Note (Signed)
 Well controlled, no changes to meds. Encouraged heart healthy diet such as the DASH diet and exercise as tolerated.

## 2024-06-03 ENCOUNTER — Ambulatory Visit: Payer: Self-pay | Admitting: Family Medicine

## 2024-06-03 ENCOUNTER — Encounter: Payer: Self-pay | Admitting: Family Medicine

## 2024-06-03 ENCOUNTER — Ambulatory Visit (INDEPENDENT_AMBULATORY_CARE_PROVIDER_SITE_OTHER): Admitting: Family Medicine

## 2024-06-03 VITALS — BP 100/60 | HR 50 | Resp 16 | Ht 70.0 in | Wt 138.2 lb

## 2024-06-03 DIAGNOSIS — E7800 Pure hypercholesterolemia, unspecified: Secondary | ICD-10-CM

## 2024-06-03 DIAGNOSIS — I10 Essential (primary) hypertension: Secondary | ICD-10-CM

## 2024-06-03 DIAGNOSIS — R739 Hyperglycemia, unspecified: Secondary | ICD-10-CM | POA: Diagnosis not present

## 2024-06-03 DIAGNOSIS — M25511 Pain in right shoulder: Secondary | ICD-10-CM | POA: Diagnosis not present

## 2024-06-03 DIAGNOSIS — S32019A Unspecified fracture of first lumbar vertebra, initial encounter for closed fracture: Secondary | ICD-10-CM | POA: Insufficient documentation

## 2024-06-03 DIAGNOSIS — S32019D Unspecified fracture of first lumbar vertebra, subsequent encounter for fracture with routine healing: Secondary | ICD-10-CM

## 2024-06-03 DIAGNOSIS — N189 Chronic kidney disease, unspecified: Secondary | ICD-10-CM | POA: Diagnosis not present

## 2024-06-03 DIAGNOSIS — D649 Anemia, unspecified: Secondary | ICD-10-CM

## 2024-06-03 DIAGNOSIS — M109 Gout, unspecified: Secondary | ICD-10-CM | POA: Diagnosis not present

## 2024-06-03 DIAGNOSIS — E538 Deficiency of other specified B group vitamins: Secondary | ICD-10-CM | POA: Diagnosis not present

## 2024-06-03 LAB — COMPREHENSIVE METABOLIC PANEL WITH GFR
ALT: 27 U/L (ref 0–53)
AST: 25 U/L (ref 0–37)
Albumin: 3.7 g/dL (ref 3.5–5.2)
Alkaline Phosphatase: 51 U/L (ref 39–117)
BUN: 55 mg/dL — ABNORMAL HIGH (ref 6–23)
CO2: 23 meq/L (ref 19–32)
Calcium: 9.1 mg/dL (ref 8.4–10.5)
Chloride: 106 meq/L (ref 96–112)
Creatinine, Ser: 1.41 mg/dL (ref 0.40–1.50)
GFR: 41.58 mL/min — ABNORMAL LOW (ref >60.00)
Glucose, Bld: 137 mg/dL — ABNORMAL HIGH (ref 70–99)
Potassium: 4.4 meq/L (ref 3.5–5.1)
Sodium: 136 meq/L (ref 135–145)
Total Bilirubin: 0.8 mg/dL (ref 0.2–1.2)
Total Protein: 5.6 g/dL — ABNORMAL LOW (ref 6.0–8.3)

## 2024-06-03 LAB — CBC WITH DIFFERENTIAL/PLATELET
Basophils Absolute: 0 K/uL (ref 0.0–0.1)
Basophils Relative: 0.1 % (ref 0.0–3.0)
Eosinophils Absolute: 0 K/uL (ref 0.0–0.7)
Eosinophils Relative: 0.1 % (ref 0.0–5.0)
HCT: 36.9 % — ABNORMAL LOW (ref 39.0–52.0)
Hemoglobin: 12.5 g/dL — ABNORMAL LOW (ref 13.0–17.0)
Lymphocytes Relative: 4.9 % — ABNORMAL LOW (ref 12.0–46.0)
Lymphs Abs: 0.5 K/uL — ABNORMAL LOW (ref 0.7–4.0)
MCHC: 33.8 g/dL (ref 30.0–36.0)
MCV: 97.9 fl (ref 78.0–100.0)
Monocytes Absolute: 0.5 K/uL (ref 0.1–1.0)
Monocytes Relative: 5 % (ref 3.0–12.0)
Neutro Abs: 8.8 K/uL — ABNORMAL HIGH (ref 1.4–7.7)
Neutrophils Relative %: 89.9 % — ABNORMAL HIGH (ref 43.0–77.0)
Platelets: 192 K/uL (ref 150.0–400.0)
RBC: 3.77 Mil/uL — ABNORMAL LOW (ref 4.22–5.81)
RDW: 16.8 % — ABNORMAL HIGH (ref 11.5–15.5)
WBC: 9.8 K/uL (ref 4.0–10.5)

## 2024-06-03 LAB — VITAMIN B12: Vitamin B-12: >2000 pg/mL — ABNORMAL HIGH (ref 200–1100)

## 2024-06-03 LAB — LIPID PANEL
Cholesterol: 113 mg/dL (ref 0–200)
HDL: 38 mg/dL — ABNORMAL LOW (ref >39.00)
LDL Cholesterol: 39 mg/dL (ref 0–99)
NonHDL: 75.17
Total CHOL/HDL Ratio: 3
Triglycerides: 179 mg/dL — ABNORMAL HIGH (ref 0.0–149.0)
VLDL: 35.8 mg/dL (ref 0.0–40.0)

## 2024-06-03 LAB — HEMOGLOBIN A1C: Hgb A1c MFr Bld: 6.1 % (ref 4.6–6.5)

## 2024-06-03 MED ORDER — CYANOCOBALAMIN 1000 MCG/ML IJ SOLN
1000.0000 ug | Freq: Once | INTRAMUSCULAR | Status: AC
  Administered 2024-06-03: 1000 ug via INTRAMUSCULAR

## 2024-06-03 NOTE — Progress Notes (Signed)
 Subjective:    Patient ID: Marcin Holte, male    DOB: 07-03-1924, 89 y.o.   MRN: [id number]  Chief Complaint  Patient presents with   Medical Management of Chronic Issues    Patient presents today for a 5 month follow-up.   Quality Metric Gaps    AWV    HPI Discussed the use of AI scribe software for clinical note transcription with the patient, who gave verbal consent to proceed.  History of Present Illness August Longest Wyn is a 89 year old male who presents for follow-up after a fall resulting in an L1 vertebral fracture. He is accompanied by his daughter, who is also his primary caregiver. He is a very active 89 year old and was in his yard doing yard work when he slipped on mud and broke his back.  He experienced a fall on the Thursday before Memorial Day while weeding in the backyard, resulting in an L1 vertebral fracture. Initial imaging included an X-ray and an MRI. He was seen by a spine and scoliosis specialist, who recommended a kyphoplasty procedure, but due to delays, the procedure was not performed. He was initially in significant pain, which was managed with meloxicam , described as a 'Secretary/administrator'. He was also prescribed salmon nasal spray, which he took for a month, and it seemed to help. He was taken off his 81 mg aspirin  temporarily but plans to resume it.  About a week ago, he developed right shoulder pain without a known injury. An X-ray showed no fracture, and he was started on prednisone , which has helped reduce tenderness but only slightly. He continues to take Tylenol  for pain management, primarily for his shoulder which helps for about 4 hours then pain returns.  He has a history of intrinsic factor deficiency requiring B12 injections. He missed a B12 shot recently, which led to increased lethargy. His appetite has improved, and he is eating three meals a day, including fruit, which previously caused diarrhea. His bowel movements are firm but  sometimes 'peanut-sized'. He has regained some weight after a period of loss following his injury.    Past Medical History:  Diagnosis Date   Abdominal aortic aneurysm (HCC)    a. 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 s/p CABG in 1979 and 1993;  b. LHC (5/14):  LM, LAD, CFX and RCA occluded; L-LAD ok, dLAD occluded after insertion of LIMA, S-OM occluded, S-PDA/AM 80-90 => PCI with Promus DES; EF 25%   Cardiomyopathy, ischemic 06/05/2013   Cerumen impaction    Bilateral   Chicken pox as a child   Chronic systolic CHF (congestive heart failure) (HCC)    COLONIC POLYPS 07/01/2008   Qualifier: Diagnosis of  By: Christi MD, Glendia HERO  Degenerative joint disease    DEGENERATIVE JOINT DISEASE 11/16/2007   Qualifier: Diagnosis of  By: Latisha CMA, Leigh     Diverticulosis of colon    DIVERTICULOSIS OF COLON 07/01/2008   Qualifier: Diagnosis of  By: Christi MD, Glendia HERO    Double vision 03/07/2017   Essential hypertension 11/16/2007   Qualifier: Diagnosis of  By: Latisha CMA, Leigh     Fingernail abnormalities 03/07/2017   FLANK PAIN, RIGHT 02/03/2010   Qualifier: History of  By: Christi MD, Glendia HERO    GERD (gastroesophageal reflux disease)    GOUT 11/16/2007   Qualifier: Diagnosis of  By: Latisha CMA, Leigh     Hearing loss 11/24/2014   Heart murmur    History of shingles 10/27/2017    Hypercholesterolemia    HYPERCHOLESTEROLEMIA 11/16/2007   Qualifier: Diagnosis of  By: Latisha CMA, Leigh     Ischemic cardiomyopathy    a. echo (09/05/13): EF 35%, diffuse HK worsened distal septal, mid/distal inferior and apical region, grade 1 diastolic dysfunction, mild LAE.     Kidney stone 08/17/2011   Loss of hearing    Lumbar back pain    Measles as a child   Medicare annual wellness visit, subsequent 11/24/2014   Sees Dr Robinson for dermatology Sees Dr Brunetta of Urology Sees Dr Pietro of cardiology Sees Dr Lawrance of Opthamology No further colonoscopies warranted        Mumps as a child   Nephrolithiasis    Pain in joint, lower leg 07/29/2014   PERIPHERAL VASCULAR DISEASE 11/16/2007   Qualifier: Diagnosis of  By: Latisha CMA, Leigh     Peripheral vascular disease (HCC)    Rectal bleeding 03/07/2017   Shingles 07/29/2014   Sun-damaged skin 05/31/2014    Past Surgical History:  Procedure Laterality Date   CORONARY ANGIOPLASTY WITH STENT PLACEMENT  04/18/2013   RCA        CORONARY ARTERY BYPASS GRAFT  1979   x4 SVG-DIAG-LAD, SVG-OM-PDA   CORONARY ARTERY BYPASS GRAFT  1993   Redo x5 by Dr Eletha; ALAN, SVG-OM, SVG-AM-PL   Decompressive laminectomy  01/2006   L2 - scarum by Dr. Amy   HEMORRHOID SURGERY     fissure with hemorrhoid corrected at age 21   INGUINAL HERNIA REPAIR  1994   Right by Dr Eletha FONTANA HERNIA REPAIR  1996   Left by Dr. Eletha   LEFT HEART CATHETERIZATION WITH CORONARY ANGIOGRAM N/A 09/23/2013   Procedure: LEFT HEART CATHETERIZATION WITH CORONARY ANGIOGRAM;  Surgeon: Ozell JONETTA Fell, MD;  Location: [facility] CATH LAB;  Service: Cardiovascular;  Laterality: N/A;   lens implants     for vision correction   PERCUTANEOUS CORONARY STENT INTERVENTION (PCI-S) N/A 04/18/2013   Procedure: PERCUTANEOUS CORONARY STENT INTERVENTION (PCI-S);  Surgeon: Ozell Fell, MD;  Location: [facility] CATH LAB;  Service: Cardiovascular;  Laterality: N/A;   TONSILLECTOMY       Family History  Problem Relation Age of Onset   Parkinsonism Brother    Diabetes Maternal Grandmother    Depression Daughter    Other Son        4 stents   Heart disease Son    Diabetes Son        type 2   Colon cancer Neg Hx    Esophageal cancer Neg Hx    Rectal cancer Neg Hx    Stomach cancer Neg Hx     Social History   Socioeconomic History   Marital status: Widowed  Spouse name: Gustav x 72 years   Number of children: 7   Years of education: Not on file   Highest education level: Master's degree (e.g., MA, MS, MEng, MEd, MSW, MBA)  Occupational History   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   Other Topics Concern   Not on file  Social History Narrative   Married   7 children   Social Drivers of Health   Financial Resource Strain: Low Risk  (04/15/2024)   Received from [facility]   Overall Financial Resource Strain (CARDIA)    Difficulty of Paying Living Expenses: Not hard at all  Food Insecurity: No Food Insecurity (04/15/2024)   Received from [facility]   Hunger Vital Sign    Within the past 12 months, you worried that your food would run out before you got the money to buy more.: Never true    Within the past 12 months, the food you bought just didn't last and you didn't have money to get more.: Never true  Transportation Needs: No Transportation Needs (04/15/2024)   Received from [facility] - Transportation    Lack of  Transportation (Medical): No    Lack of Transportation (Non-Medical): No  Physical Activity: Unknown (01/01/2024)   Exercise Vital Sign    Days of Exercise per Week: Patient declined    Minutes of Exercise per Session: Not on file  Stress: Stress Concern Present (01/01/2024)   [employer] [facility] Occupational [facility] - Occupational Stress Questionnaire    Feeling of Stress : To some extent  Social Connections: Socially Isolated (01/01/2024)   Social Connection and Isolation Panel    Frequency of Communication with Friends and Family: More than three times a week    Frequency of Social Gatherings with Friends and Family: More than three times a week    Attends Religious Services: Never    Database administrator or Organizations: No    Attends Banker Meetings: Not on file    Marital Status: Widowed  Intimate Partner Violence: Unknown (11/22/2022)   Received from [facility]   HITS    Physically Hurt: Not on file    Insult or Talk Down To: Not on file    Threaten Physical Harm: Not on file    Scream or Curse: Not on file    Outpatient Medications Prior to Visit  Medication Sig Dispense Refill   b complex vitamins capsule Take 1 capsule by mouth daily.     busPIRone  (BUSPAR ) 5 MG tablet Take 1 tablet (5 mg total) by mouth 2 (two) times daily as needed. 180 tablet 0   Cholecalciferol (VITAMIN D -3 PO) Take 5,000 Units by mouth daily with breakfast.     empagliflozin  (JARDIANCE ) 10 MG TABS tablet Take 1 tablet (10 mg total) by mouth daily before breakfast. 90 tablet 3   famotidine  (PEPCID )  20 MG tablet Take 1 tablet (20 mg total) by mouth 2 (two) times daily. 180 tablet 0   finasteride  (PROSCAR ) 5 MG tablet Takes every third day     folic acid  (FOLVITE ) 400 MCG tablet Take 400 mcg by mouth 2 (two) times daily.     furosemide  (LASIX ) 20 MG tablet Take 1 tablet (20 mg total) by mouth daily. Take an additional 20 mg daily as needed for swelling or weight gain of 3lbs in one day 90  tablet 3   meloxicam  (MOBIC ) 7.5 MG tablet Take 1 tablet (7.5 mg total) by mouth daily. 30 tablet 1   metoprolol  succinate (TOPROL -XL) 25 MG 24 hr tablet Take 1/2 (one-half) tablet by mouth once daily 45 tablet 2   Misc Natural Products (OSTEO BI-FLEX ADV JOINT SHIELD) TABS Take 1 tablet by mouth 2 (two) times daily.     Multiple Vitamin (MULTIVITAMIN) tablet Take 1 tablet by mouth daily.     nitroGLYCERIN  (NITROSTAT ) 0.4 MG SL tablet DISSOLVE ONE TABLET UNDER THE TONGUE EVERY 5 MINUTES AS NEEDED FOR CHEST PAIN.  DO NOT EXCEED A TOTAL OF 3 DOSES IN 15 MINUTES 25 tablet 2   predniSONE  (DELTASONE ) 10 MG tablet TAKE 3 TABLETS PO QD FOR 3 DAYS THEN TAKE 2 TABLETS PO QD FOR 3 DAYS THEN TAKE 1 TABLET PO QD FOR 3 DAYS THEN TAKE 1/2 TAB PO QD FOR 3 DAYS 20 tablet 0   sacubitril -valsartan  (ENTRESTO ) 24-26 MG Take 1 tablet by mouth 2 (two) times daily. 180 tablet 3   simvastatin  (ZOCOR ) 40 MG tablet TAKE 1 TABLET BY MOUTH ONCE DAILY IN THE EVENING 90 tablet 1   aspirin  EC 81 MG tablet Take 81 mg by mouth every morning.  (Patient not taking: Reported on 06/03/2024)     No facility-administered medications prior to visit.    Allergies  Allergen Reactions   Baclofen  Other (See Comments)    Altered mental status  Other Reaction(s): Delirium   Celecoxib  Other (See Comments)    Altered mental status   Lisinopril Other (See Comments) and Rash    REACTION: dizziness   Methocarbamol Rash    REACTION: pt states dizzy  Other Reaction(s): Dizziness   Other     Other Reaction(s): Other   Pregabalin Rash    REACTION: pt states dizzy  Other Reaction(s): Dizziness   Ramipril Hives and Rash    REACTION: hives and dizziness  Other Reaction(s): Dizziness   Tramadol  Other (See Comments)    Altered mental status  Other Reaction(s): Delirium   Allopurinol  Other (See Comments)    Review of Systems  Constitutional:  Negative for fever and malaise/fatigue.  HENT:  Negative for congestion.   Eyes:   Negative for blurred vision.  Respiratory:  Negative for shortness of breath.   Cardiovascular:  Negative for chest pain, palpitations and leg swelling.  Gastrointestinal:  Negative for abdominal pain, blood in stool and nausea.  Genitourinary:  Negative for dysuria and frequency.  Musculoskeletal:  Positive for joint pain. Negative for falls.  Skin:  Negative for rash.  Neurological:  Negative for dizziness, loss of consciousness and headaches.  Endo/Heme/Allergies:  Negative for environmental allergies.  Psychiatric/Behavioral:  Negative for depression. The patient is not nervous/anxious.        Objective:    Physical Exam Vitals reviewed.  Constitutional:      Appearance: Normal appearance. He is not ill-appearing.     Comments: In wheelchair  HENT:     Head: Normocephalic  and atraumatic.     Nose: Nose normal.  Eyes:     Conjunctiva/sclera: Conjunctivae normal.  Cardiovascular:     Rate and Rhythm: Normal rate.     Pulses: Normal pulses.     Heart sounds: Normal heart sounds. No murmur heard. Pulmonary:     Effort: Pulmonary effort is normal.     Breath sounds: Normal breath sounds. No wheezing.  Abdominal:     Palpations: Abdomen is soft. There is no mass.     Tenderness: There is no abdominal tenderness.  Musculoskeletal:     Cervical back: Normal range of motion.     Right lower leg: No edema.     Left lower leg: No edema.  Skin:    General: Skin is warm and dry.  Neurological:     General: No focal deficit present.     Mental Status: He is alert and oriented to person, place, and time.  Psychiatric:        Mood and Affect: Mood normal.     BP 100/60   Pulse (!) 50   Resp 16   Ht 5' 10 (1.778 m)   Wt 138 lb 3.2 oz (62.7 kg)   SpO2 98%   BMI 19.83 kg/m  Wt Readings from Last 3 Encounters:  06/03/24 138 lb 3.2 oz (62.7 kg)  01/31/24 140 lb (63.5 kg)  01/01/24 140 lb (63.5 kg)    Diabetic Foot Exam - Simple   No data filed    Lab Results   Component Value Date   WBC 7.5 01/01/2024   HGB 13.6 01/01/2024   HCT 40.6 01/01/2024   PLT 201.0 01/01/2024   GLUCOSE 110 (H) 01/12/2024   CHOL 100 01/01/2024   TRIG 93.0 01/01/2024   HDL 45.60 01/01/2024   LDLCALC 36 01/01/2024   ALT 16 01/12/2024   AST 23 01/12/2024   NA 137 01/12/2024   K 4.3 01/12/2024   CL 103 01/12/2024   CREATININE 1.48 01/12/2024   BUN 37 (H) 01/12/2024   CO2 25 01/12/2024   TSH 1.69 01/01/2024   PSA 25.40 12/08/2021   INR 1.09 02/04/2018   HGBA1C 5.8 01/01/2024    Lab Results  Component Value Date   TSH 1.69 01/01/2024   Lab Results  Component Value Date   WBC 7.5 01/01/2024   HGB 13.6 01/01/2024   HCT 40.6 01/01/2024   MCV 98.0 01/01/2024   PLT 201.0 01/01/2024   Lab Results  Component Value Date   NA 137 01/12/2024   K 4.3 01/12/2024   CO2 25 01/12/2024   GLUCOSE 110 (H) 01/12/2024   BUN 37 (H) 01/12/2024   CREATININE 1.48 01/12/2024   BILITOT 0.9 01/12/2024   ALKPHOS 56 01/12/2024   AST 23 01/12/2024   ALT 16 01/12/2024   PROT 6.1 01/12/2024   ALBUMIN 3.9 01/12/2024   CALCIUM  8.8 01/12/2024   ANIONGAP 7 11/30/2019   EGFR 39 (L) 04/14/2023   GFR 39.34 (L) 01/12/2024   Lab Results  Component Value Date   CHOL 100 01/01/2024   Lab Results  Component Value Date   HDL 45.60 01/01/2024   Lab Results  Component Value Date   LDLCALC 36 01/01/2024   Lab Results  Component Value Date   TRIG 93.0 01/01/2024   Lab Results  Component Value Date   CHOLHDL 2 01/01/2024   Lab Results  Component Value Date   HGBA1C 5.8 01/01/2024       Assessment & Plan:  B12  deficiency Assessment & Plan: Supplement and monitor   Orders: -     Cyanocobalamin  -     Vitamin B12  Chronic kidney disease, unspecified CKD stage Assessment & Plan: Hydrate and monitor    Essential hypertension Assessment & Plan: Well controlled, no changes to meds. Encouraged heart healthy diet such as the DASH diet and exercise as tolerated.     Orders: -     Comprehensive metabolic panel with GFR -     CBC with Differential/Platelet  Gout, unspecified cause, unspecified chronicity, unspecified site Assessment & Plan: Hydrate and monitor    Hyperglycemia Assessment & Plan: hgba1c acceptable, minimize simple carbs. Increase exercise as tolerated.   Orders: -     Lipid panel -     Hemoglobin A1c  Closed fracture of first lumbar vertebra with routine healing, unspecified fracture morphology, subsequent encounter  HYPERCHOLESTEROLEMIA -     Lipid panel    Assessment and Plan Assessment & Plan L1 Vertebral Fracture L1 vertebral fracture healing well. Kyphoplasty not needed. Aspirin  to be resumed due to low bleeding risk. - Release records from [facility] sc[facility] center for imaging from May 2025. - Resume 81 mg aspirin  daily. - Order routine blood work for anemia and other parameters.  Right Shoulder Pain Persistent right shoulder pain without trauma. Prednisone  taper effective but pain returns without Tylenol . Sports medicine referral for further evaluation and possible steroid injection. - Refer to sports medicine for further evaluation and possible steroid injection.  Vitamin B12 Deficiency Vitamin B12 deficiency due to positive intrinsic factor. Missed injection may cause lethargy. Sublingual B12 suggested if injections missed. - Administer B12 injection today. - Consider sublingual B12 if injections are missed.  Nutritional Status and Weight Management Weight regained to 138.2 lbs post-fall. Improved protein and fruit intake. Encouraged to maintain protein intake for muscle strength. - Encourage regular protein intake multiple times a day. - Monitor weight and nutritional intake.  Bowel Irregularity Irregular bowel movements with firm stools. Tolerates fruit without diarrhea. - Monitor bowel habits and dietary intake.     Harlene Horton, MD

## 2024-06-03 NOTE — Patient Instructions (Signed)

## 2024-06-03 NOTE — Assessment & Plan Note (Signed)
 Healing well. They considered kyphoplasty but he healed without it.

## 2024-06-04 ENCOUNTER — Telehealth: Payer: Self-pay

## 2024-06-04 NOTE — Telephone Encounter (Signed)
 Patient Advocate Encounter   DUE FOR RENEWAL    The patient was approved for a [employer] grant that will help cover the cost of ENTRESTO /JARDIANCE  Total amount awarded, $4,500.  Effective: 06/23/24 - 06/22/25   APW:[id number] ERW:[id number] Hmnle:[id number] [id number]  Ileana Lehmann, CPhT  Pharmacy Patient Advocate Specialist  Direct Number: [phone] Fax: [phone]

## 2024-06-06 NOTE — Progress Notes (Signed)
 On 07/04/24 nurse visit appointment.

## 2024-06-11 NOTE — Progress Notes (Unsigned)
    Ben Jackson D.CLEMENTEEN [facility] [address] [place] [address] Phone: [phone]   Assessment and Plan:     There are no diagnoses linked to this encounter.  ***   Pertinent previous records reviewed include ***    Follow Up: ***     Subjective:   I, Daniel Reeves, am serving as a Neurosurgeon for Doctor Morene Mace  Chief Complaint: right shoulder pain   HPI:   06/12/2024 Patient is a 89 year old male with right shoulder pain. Patient states   Relevant Historical Information: ***  Additional pertinent review of systems negative.   Current Outpatient Medications:    aspirin  EC 81 MG tablet, Take 81 mg by mouth every morning.  (Patient not taking: Reported on 06/03/2024), Disp: , Rfl:    b complex vitamins capsule, Take 1 capsule by mouth daily., Disp: , Rfl:    busPIRone  (BUSPAR ) 5 MG tablet, Take 1 tablet (5 mg total) by mouth 2 (two) times daily as needed., Disp: 180 tablet, Rfl: 0   Cholecalciferol (VITAMIN D -3 PO), Take 5,000 Units by mouth daily with breakfast., Disp: , Rfl:    empagliflozin  (JARDIANCE ) 10 MG TABS tablet, Take 1 tablet (10 mg total) by mouth daily before breakfast., Disp: 90 tablet, Rfl: 3   famotidine  (PEPCID ) 20 MG tablet, Take 1 tablet (20 mg total) by mouth 2 (two) times daily., Disp: 180 tablet, Rfl: 0   finasteride  (PROSCAR ) 5 MG tablet, Takes every third day, Disp: , Rfl:    folic acid  (FOLVITE ) 400 MCG tablet, Take 400 mcg by mouth 2 (two) times daily., Disp: , Rfl:    furosemide  (LASIX ) 20 MG tablet, Take 1 tablet (20 mg total) by mouth daily. Take an additional 20 mg daily as needed for swelling or weight gain of 3lbs in one day, Disp: 90 tablet, Rfl: 3   meloxicam  (MOBIC ) 7.5 MG tablet, Take 1 tablet (7.5 mg total) by mouth daily., Disp: 30 tablet, Rfl: 1   metoprolol  succinate (TOPROL -XL) 25 MG 24 hr tablet, Take 1/2 (one-half) tablet by mouth once daily, Disp: 45 tablet, Rfl: 2   Misc Natural  Products (OSTEO BI-FLEX ADV JOINT SHIELD) TABS, Take 1 tablet by mouth 2 (two) times daily., Disp: , Rfl:    Multiple Vitamin (MULTIVITAMIN) tablet, Take 1 tablet by mouth daily., Disp: , Rfl:    nitroGLYCERIN  (NITROSTAT ) 0.4 MG SL tablet, DISSOLVE ONE TABLET UNDER THE TONGUE EVERY 5 MINUTES AS NEEDED FOR CHEST PAIN.  DO NOT EXCEED A TOTAL OF 3 DOSES IN 15 MINUTES, Disp: 25 tablet, Rfl: 2   predniSONE  (DELTASONE ) 10 MG tablet, TAKE 3 TABLETS PO QD FOR 3 DAYS THEN TAKE 2 TABLETS PO QD FOR 3 DAYS THEN TAKE 1 TABLET PO QD FOR 3 DAYS THEN TAKE 1/2 TAB PO QD FOR 3 DAYS, Disp: 20 tablet, Rfl: 0   sacubitril -valsartan  (ENTRESTO ) 24-26 MG, Take 1 tablet by mouth 2 (two) times daily., Disp: 180 tablet, Rfl: 3   simvastatin  (ZOCOR ) 40 MG tablet, TAKE 1 TABLET BY MOUTH ONCE DAILY IN THE EVENING, Disp: 90 tablet, Rfl: 1   Objective:     There were no vitals filed for this visit.    There is no height or weight on file to calculate BMI.    Physical Exam:    ***   Electronically signed by:  Odis Mace D.[facility]MENT[facility] 7:46 AM 06/11/24

## 2024-06-12 ENCOUNTER — Ambulatory Visit: Admitting: Sports Medicine

## 2024-06-12 VITALS — BP 124/60 | HR 62 | Ht 70.0 in | Wt 131.8 lb

## 2024-06-12 DIAGNOSIS — G8929 Other chronic pain: Secondary | ICD-10-CM

## 2024-06-12 DIAGNOSIS — M25511 Pain in right shoulder: Secondary | ICD-10-CM | POA: Diagnosis not present

## 2024-06-12 NOTE — Patient Instructions (Signed)
 Rotator cuff Hep. Follow up in 4 weeks.

## 2024-06-17 ENCOUNTER — Other Ambulatory Visit: Payer: Self-pay | Admitting: Family

## 2024-06-22 ENCOUNTER — Other Ambulatory Visit: Payer: Self-pay | Admitting: Family Medicine

## 2024-06-30 DIAGNOSIS — Z043 Encounter for examination and observation following other accident: Secondary | ICD-10-CM | POA: Diagnosis not present

## 2024-06-30 DIAGNOSIS — S299XXA Unspecified injury of thorax, initial encounter: Secondary | ICD-10-CM | POA: Diagnosis not present

## 2024-06-30 DIAGNOSIS — S3992XA Unspecified injury of lower back, initial encounter: Secondary | ICD-10-CM | POA: Diagnosis not present

## 2024-06-30 DIAGNOSIS — S52614A Nondisplaced fracture of right ulna styloid process, initial encounter for closed fracture: Secondary | ICD-10-CM | POA: Diagnosis not present

## 2024-06-30 DIAGNOSIS — N281 Cyst of kidney, acquired: Secondary | ICD-10-CM | POA: Diagnosis not present

## 2024-06-30 DIAGNOSIS — S199XXA Unspecified injury of neck, initial encounter: Secondary | ICD-10-CM | POA: Diagnosis not present

## 2024-06-30 DIAGNOSIS — W19XXXA Unspecified fall, initial encounter: Secondary | ICD-10-CM | POA: Diagnosis not present

## 2024-06-30 DIAGNOSIS — K5730 Diverticulosis of large intestine without perforation or abscess without bleeding: Secondary | ICD-10-CM | POA: Diagnosis not present

## 2024-06-30 DIAGNOSIS — M79641 Pain in right hand: Secondary | ICD-10-CM | POA: Diagnosis not present

## 2024-06-30 DIAGNOSIS — N400 Enlarged prostate without lower urinary tract symptoms: Secondary | ICD-10-CM | POA: Diagnosis not present

## 2024-06-30 DIAGNOSIS — M25511 Pain in right shoulder: Secondary | ICD-10-CM | POA: Diagnosis not present

## 2024-06-30 DIAGNOSIS — M19011 Primary osteoarthritis, right shoulder: Secondary | ICD-10-CM | POA: Diagnosis not present

## 2024-06-30 DIAGNOSIS — M1811 Unilateral primary osteoarthritis of first carpometacarpal joint, right hand: Secondary | ICD-10-CM | POA: Diagnosis not present

## 2024-06-30 DIAGNOSIS — S0081XA Abrasion of other part of head, initial encounter: Secondary | ICD-10-CM | POA: Diagnosis not present

## 2024-06-30 DIAGNOSIS — S0990XA Unspecified injury of head, initial encounter: Secondary | ICD-10-CM | POA: Diagnosis not present

## 2024-06-30 DIAGNOSIS — I700 Atherosclerosis of aorta: Secondary | ICD-10-CM | POA: Diagnosis not present

## 2024-06-30 DIAGNOSIS — M25519 Pain in unspecified shoulder: Secondary | ICD-10-CM | POA: Diagnosis not present

## 2024-07-01 DIAGNOSIS — I447 Left bundle-branch block, unspecified: Secondary | ICD-10-CM | POA: Diagnosis not present

## 2024-07-01 DIAGNOSIS — I491 Atrial premature depolarization: Secondary | ICD-10-CM | POA: Diagnosis not present

## 2024-07-04 ENCOUNTER — Ambulatory Visit (INDEPENDENT_AMBULATORY_CARE_PROVIDER_SITE_OTHER)

## 2024-07-04 ENCOUNTER — Other Ambulatory Visit (INDEPENDENT_AMBULATORY_CARE_PROVIDER_SITE_OTHER)

## 2024-07-04 DIAGNOSIS — E538 Deficiency of other specified B group vitamins: Secondary | ICD-10-CM

## 2024-07-04 DIAGNOSIS — D649 Anemia, unspecified: Secondary | ICD-10-CM

## 2024-07-04 LAB — CBC WITH DIFFERENTIAL/PLATELET
Basophils Absolute: 0 K/uL (ref 0.0–0.1)
Basophils Relative: 0.6 % (ref 0.0–3.0)
Eosinophils Absolute: 0.1 K/uL (ref 0.0–0.7)
Eosinophils Relative: 1.7 % (ref 0.0–5.0)
HCT: 38.9 % — ABNORMAL LOW (ref 39.0–52.0)
Hemoglobin: 12.8 g/dL — ABNORMAL LOW (ref 13.0–17.0)
Lymphocytes Relative: 11.2 % — ABNORMAL LOW (ref 12.0–46.0)
Lymphs Abs: 0.9 K/uL (ref 0.7–4.0)
MCHC: 33 g/dL (ref 30.0–36.0)
MCV: 99.9 fl (ref 78.0–100.0)
Monocytes Absolute: 0.8 K/uL (ref 0.1–1.0)
Monocytes Relative: 9.8 % (ref 3.0–12.0)
Neutro Abs: 6.3 K/uL (ref 1.4–7.7)
Neutrophils Relative %: 76.7 % (ref 43.0–77.0)
Platelets: 305 K/uL (ref 150.0–400.0)
RBC: 3.89 Mil/uL — ABNORMAL LOW (ref 4.22–5.81)
RDW: 18.1 % — ABNORMAL HIGH (ref 11.5–15.5)
WBC: 8.2 K/uL (ref 4.0–10.5)

## 2024-07-04 LAB — VITAMIN B12: Vitamin B-12: 714 pg/mL (ref 200–1100)

## 2024-07-04 MED ORDER — CYANOCOBALAMIN 1000 MCG/ML IJ SOLN
1000.0000 ug | Freq: Once | INTRAMUSCULAR | Status: AC
  Administered 2024-07-04: 1000 ug via INTRAMUSCULAR

## 2024-07-04 NOTE — Progress Notes (Signed)
 Pt here for monthly B12 injection per Domenica   B12 1000mcg given IM, and pt tolerated injection well.   Next B12 injection scheduled for next month

## 2024-07-05 LAB — COMPREHENSIVE METABOLIC PANEL WITH GFR
ALT: 11 U/L (ref 0–53)
AST: 15 U/L (ref 0–37)
Albumin: 3.7 g/dL (ref 3.5–5.2)
Alkaline Phosphatase: 61 U/L (ref 39–117)
BUN: 61 mg/dL — ABNORMAL HIGH (ref 6–23)
CO2: 19 meq/L (ref 19–32)
Calcium: 9.4 mg/dL (ref 8.4–10.5)
Chloride: 106 meq/L (ref 96–112)
Creatinine, Ser: 1.58 mg/dL — ABNORMAL HIGH (ref 0.40–1.50)
GFR: 36.25 mL/min — ABNORMAL LOW (ref >60.00)
Glucose, Bld: 96 mg/dL (ref 70–99)
Potassium: 4.7 meq/L (ref 3.5–5.1)
Sodium: 138 meq/L (ref 135–145)
Total Bilirubin: 0.5 mg/dL (ref 0.2–1.2)
Total Protein: 5.7 g/dL — ABNORMAL LOW (ref 6.0–8.3)

## 2024-07-08 ENCOUNTER — Ambulatory Visit: Payer: Self-pay | Admitting: Family Medicine

## 2024-07-09 NOTE — Progress Notes (Signed)
 Ben Jackson D.[facility] [address] [place] [address] Phone: [phone]   Assessment and Plan:     There are no diagnoses linked to this encounter.  ***   Pertinent previous records reviewed include ***    Follow Up: ***     Subjective:   I, Davie Sagona, am serving as a Neurosurgeon for Doctor Morene Mace  Chief Complaint: right shoulder pain    HPI:    06/12/2024 Patient is a 89 year old male with right shoulder pain. Patient states that he did have an injury to the shoulder but does not remember how he injured it. X-ray was done and there was no fractures. Pain is located on the outside upper arm. Has pain when lifting his dinner try from its stand.    Duration?about two weeks ago Did you have an Injury to cause this pain?yes Taking Medication for pain? Yes (meloxicam , tylenol  500 mg) Numbness or Tingling?yes Does the pain Radiate? no Altered gait or use?yes ROM/ impairment of movement?yes   07/10/2024 Patient states     Relevant Historical Information: Fractured Back ([facility] day weekend)  Additional pertinent review of systems negative.   Current Outpatient Medications:    aspirin  EC 81 MG tablet, Take 81 mg by mouth every morning. , Disp: , Rfl:    b complex vitamins capsule, Take 1 capsule by mouth daily., Disp: , Rfl:    busPIRone  (BUSPAR ) 5 MG tablet, Take 1 tablet by mouth twice daily as needed, Disp: 180 tablet, Rfl: 1   Cholecalciferol (VITAMIN D -3 PO), Take 5,000 Units by mouth daily with breakfast., Disp: , Rfl:    empagliflozin  (JARDIANCE ) 10 MG TABS tablet, Take 1 tablet (10 mg total) by mouth daily before breakfast., Disp: 90 tablet, Rfl: 3   famotidine  (PEPCID ) 20 MG tablet, Take 1 tablet (20 mg total) by mouth 2 (two) times daily., Disp: 180 tablet, Rfl: 0   finasteride  (PROSCAR ) 5 MG tablet, Takes every third day, Disp: , Rfl:    folic acid  (FOLVITE ) 400 MCG tablet, Take 400 mcg by mouth 2 (two)  times daily., Disp: , Rfl:    furosemide  (LASIX ) 20 MG tablet, Take 1 tablet (20 mg total) by mouth daily. Take an additional 20 mg daily as needed for swelling or weight gain of 3lbs in one day, Disp: 90 tablet, Rfl: 3   meloxicam  (MOBIC ) 7.5 MG tablet, Take 1 tablet (7.5 mg total) by mouth daily., Disp: 30 tablet, Rfl: 1   metoprolol  succinate (TOPROL -XL) 25 MG 24 hr tablet, Take 1/2 (one-half) tablet by mouth once daily, Disp: 45 tablet, Rfl: 2   Misc Natural Products (OSTEO BI-FLEX ADV JOINT SHIELD) TABS, Take 1 tablet by mouth 2 (two) times daily., Disp: , Rfl:    Multiple Vitamin (MULTIVITAMIN) tablet, Take 1 tablet by mouth daily., Disp: , Rfl:    nitroGLYCERIN  (NITROSTAT ) 0.4 MG SL tablet, DISSOLVE ONE TABLET UNDER THE TONGUE EVERY 5 MINUTES AS NEEDED FOR CHEST PAIN.  DO NOT EXCEED A TOTAL OF 3 DOSES IN 15 MINUTES, Disp: 25 tablet, Rfl: 2   predniSONE  (DELTASONE ) 10 MG tablet, TAKE 3 TABLETS PO QD FOR 3 DAYS THEN TAKE 2 TABLETS PO QD FOR 3 DAYS THEN TAKE 1 TABLET PO QD FOR 3 DAYS THEN TAKE 1/2 TAB PO QD FOR 3 DAYS, Disp: 20 tablet, Rfl: 0   sacubitril -valsartan  (ENTRESTO ) 24-26 MG, Take 1 tablet by mouth 2 (two) times daily., Disp: 180 tablet, Rfl: 3  simvastatin  (ZOCOR ) 40 MG tablet, TAKE 1 TABLET BY MOUTH ONCE DAILY IN THE EVENING, Disp: 90 tablet, Rfl: 1   Objective:     There were no vitals filed for this visit.    There is no height or weight on file to calculate BMI.    Physical Exam:    ***   Electronically signed by:  Odis Mace D.[facility] 7:43 AM 07/09/24

## 2024-07-10 ENCOUNTER — Ambulatory Visit: Admitting: Sports Medicine

## 2024-07-10 VITALS — BP 98/68 | Ht 70.0 in | Wt 131.0 lb

## 2024-07-10 DIAGNOSIS — M25511 Pain in right shoulder: Secondary | ICD-10-CM | POA: Diagnosis not present

## 2024-07-10 DIAGNOSIS — S52601A Unspecified fracture of lower end of right ulna, initial encounter for closed fracture: Secondary | ICD-10-CM | POA: Diagnosis not present

## 2024-07-10 DIAGNOSIS — G8929 Other chronic pain: Secondary | ICD-10-CM | POA: Diagnosis not present

## 2024-07-10 MED ORDER — MELOXICAM 7.5 MG PO TABS: 7.5000 mg | ORAL_TABLET | Freq: Every day | ORAL | 1 refills | Status: DC

## 2024-07-10 NOTE — Patient Instructions (Signed)
 Tylenol  [phone] mg 2-3 times a day for pain relief    - Use meloxicam  15 mg daily as needed for pain.  Recommend limiting chronic NSAIDs to 1-2 doses per week to prevent long-term side effects.   6 week follow up

## 2024-07-18 ENCOUNTER — Telehealth: Payer: Self-pay

## 2024-07-18 NOTE — Telephone Encounter (Signed)
 Copied from CRM [id number]. Topic: Clinical - Medication Question >> Jul 18, 2024 12:30 PM Mercedes MATSU wrote: Reason for CRM: Patients daughter Inocente called with the patient on the line and stated that the patient is still in a lot of pain. The patient stated that the meloxicam  an ibuprofen is not working, and wanted to know if he could be prescribed something else. Patients daughter states that the best call back would be [phone]. Patient is currently on Meloxicam  7.5mg .

## 2024-07-19 NOTE — Telephone Encounter (Signed)
 Pt daughter called and lvm to return call

## 2024-07-22 ENCOUNTER — Encounter: Payer: Self-pay | Admitting: Family Medicine

## 2024-07-22 ENCOUNTER — Telehealth: Payer: Self-pay

## 2024-07-22 NOTE — Telephone Encounter (Unsigned)
 Returned [place] w/ Hospice call and no answer left vm to return call.   Copied from CRM #[id number]. Topic: Clinical - Home Health Verbal Orders >> Jul 22, 2024  9:52 AM Drema MATSU wrote: Caller/Agency: [facility] Number: [phone] Service Requested: Hospice Frequency: N/A Any new concerns about the patient? No

## 2024-07-23 ENCOUNTER — Other Ambulatory Visit: Payer: Self-pay | Admitting: Family Medicine

## 2024-07-23 DIAGNOSIS — S32019D Unspecified fracture of first lumbar vertebra, subsequent encounter for fracture with routine healing: Secondary | ICD-10-CM

## 2024-07-23 DIAGNOSIS — I714 Abdominal aortic aneurysm, without rupture, unspecified: Secondary | ICD-10-CM

## 2024-07-23 DIAGNOSIS — I2510 Atherosclerotic heart disease of native coronary artery without angina pectoris: Secondary | ICD-10-CM

## 2024-07-23 DIAGNOSIS — I255 Ischemic cardiomyopathy: Secondary | ICD-10-CM

## 2024-07-23 DIAGNOSIS — I739 Peripheral vascular disease, unspecified: Secondary | ICD-10-CM

## 2024-07-23 DIAGNOSIS — M545 Low back pain, unspecified: Secondary | ICD-10-CM

## 2024-07-23 MED ORDER — GABAPENTIN 100 MG PO CAPS: 100.0000 mg | ORAL_CAPSULE | Freq: Three times a day (TID) | ORAL | 1 refills | Status: DC

## 2024-07-25 DIAGNOSIS — Z8781 Personal history of (healed) traumatic fracture: Secondary | ICD-10-CM | POA: Diagnosis not present

## 2024-07-25 DIAGNOSIS — Z87891 Personal history of nicotine dependence: Secondary | ICD-10-CM | POA: Diagnosis not present

## 2024-07-25 DIAGNOSIS — I129 Hypertensive chronic kidney disease with stage 1 through stage 4 chronic kidney disease, or unspecified chronic kidney disease: Secondary | ICD-10-CM | POA: Diagnosis not present

## 2024-07-25 DIAGNOSIS — R296 Repeated falls: Secondary | ICD-10-CM | POA: Diagnosis not present

## 2024-07-25 DIAGNOSIS — M1990 Unspecified osteoarthritis, unspecified site: Secondary | ICD-10-CM | POA: Diagnosis not present

## 2024-07-25 DIAGNOSIS — N400 Enlarged prostate without lower urinary tract symptoms: Secondary | ICD-10-CM | POA: Diagnosis not present

## 2024-07-25 DIAGNOSIS — N184 Chronic kidney disease, stage 4 (severe): Secondary | ICD-10-CM | POA: Diagnosis not present

## 2024-07-25 DIAGNOSIS — I255 Ischemic cardiomyopathy: Secondary | ICD-10-CM | POA: Diagnosis not present

## 2024-07-25 DIAGNOSIS — I714 Abdominal aortic aneurysm, without rupture, unspecified: Secondary | ICD-10-CM | POA: Diagnosis not present

## 2024-07-25 DIAGNOSIS — E440 Moderate protein-calorie malnutrition: Secondary | ICD-10-CM | POA: Diagnosis not present

## 2024-07-25 DIAGNOSIS — M109 Gout, unspecified: Secondary | ICD-10-CM | POA: Diagnosis not present

## 2024-07-25 NOTE — Telephone Encounter (Unsigned)
 Copied from CRM #[id number]. Topic: Clinical - Home Health Verbal Orders >> Jul 22, 2024  9:52 AM Drema MATSU wrote: Caller/Agency: [facility] Number: [phone] Service Requested: Hospice Frequency: N/A Any new concerns about the patient? No >> Jul 25, 2024  1:41 PM Mia F wrote: Sherrell calling from [facility] is calling to return Dr Domenica call. Dr Domenica was calling to find out why pt was enrolling into hospital. Pt is reporting decline and pt was enrolled today 8/28 with a primary dx of malnutrition. If needed call [phone] (ask for Sherrell)

## 2024-07-26 DIAGNOSIS — I129 Hypertensive chronic kidney disease with stage 1 through stage 4 chronic kidney disease, or unspecified chronic kidney disease: Secondary | ICD-10-CM | POA: Diagnosis not present

## 2024-07-26 DIAGNOSIS — I255 Ischemic cardiomyopathy: Secondary | ICD-10-CM | POA: Diagnosis not present

## 2024-07-26 DIAGNOSIS — R296 Repeated falls: Secondary | ICD-10-CM | POA: Diagnosis not present

## 2024-07-26 DIAGNOSIS — E440 Moderate protein-calorie malnutrition: Secondary | ICD-10-CM | POA: Diagnosis not present

## 2024-07-26 DIAGNOSIS — N184 Chronic kidney disease, stage 4 (severe): Secondary | ICD-10-CM | POA: Diagnosis not present

## 2024-07-26 DIAGNOSIS — Z8781 Personal history of (healed) traumatic fracture: Secondary | ICD-10-CM | POA: Diagnosis not present

## 2024-07-26 NOTE — Telephone Encounter (Signed)
 Patient's daughter Idell) wanted to inform Dr. Domenica hospice has stepped in now and a nurse will come out today,07/26/24 to evaluate the patient.

## 2024-07-29 DIAGNOSIS — E440 Moderate protein-calorie malnutrition: Secondary | ICD-10-CM | POA: Diagnosis not present

## 2024-07-30 DIAGNOSIS — W19XXXA Unspecified fall, initial encounter: Secondary | ICD-10-CM | POA: Diagnosis not present

## 2024-07-30 DIAGNOSIS — I959 Hypotension, unspecified: Secondary | ICD-10-CM | POA: Diagnosis not present

## 2024-07-30 DIAGNOSIS — E440 Moderate protein-calorie malnutrition: Secondary | ICD-10-CM | POA: Diagnosis not present

## 2024-07-30 DIAGNOSIS — M549 Dorsalgia, unspecified: Secondary | ICD-10-CM | POA: Diagnosis not present

## 2024-07-30 DIAGNOSIS — S22081D Stable burst fracture of T11-T12 vertebra, subsequent encounter for fracture with routine healing: Secondary | ICD-10-CM | POA: Diagnosis not present

## 2024-07-30 DIAGNOSIS — Z743 Need for continuous supervision: Secondary | ICD-10-CM | POA: Diagnosis not present

## 2024-07-30 DIAGNOSIS — J9811 Atelectasis: Secondary | ICD-10-CM | POA: Diagnosis not present

## 2024-07-30 DIAGNOSIS — K5900 Constipation, unspecified: Secondary | ICD-10-CM | POA: Diagnosis not present

## 2024-07-30 DIAGNOSIS — M25519 Pain in unspecified shoulder: Secondary | ICD-10-CM | POA: Diagnosis not present

## 2024-07-30 DIAGNOSIS — R531 Weakness: Secondary | ICD-10-CM | POA: Diagnosis not present

## 2024-07-30 DIAGNOSIS — K5903 Drug induced constipation: Secondary | ICD-10-CM | POA: Diagnosis not present

## 2024-07-30 DIAGNOSIS — S32011A Stable burst fracture of first lumbar vertebra, initial encounter for closed fracture: Secondary | ICD-10-CM | POA: Diagnosis not present

## 2024-07-30 DIAGNOSIS — S22080A Wedge compression fracture of T11-T12 vertebra, initial encounter for closed fracture: Secondary | ICD-10-CM | POA: Diagnosis not present

## 2024-07-31 ENCOUNTER — Telehealth: Payer: Self-pay

## 2024-07-31 NOTE — Telephone Encounter (Signed)
 Copied from CRM #[id number]. Topic: General - Other >> Jul 31, 2024 11:23 AM [place] J wrote: Reason for CRM: Patient's daughter Idell) would like to let Dr Domenica know that the patient is receiving hospice and all future appointments have been cancelled.

## 2024-08-01 DIAGNOSIS — E440 Moderate protein-calorie malnutrition: Secondary | ICD-10-CM | POA: Diagnosis not present

## 2024-08-01 NOTE — Telephone Encounter (Signed)
 Daughter Inocente said yes you may call her at anytime at [phone].

## 2024-08-02 DIAGNOSIS — Z7401 Bed confinement status: Secondary | ICD-10-CM | POA: Diagnosis not present

## 2024-08-02 DIAGNOSIS — R404 Transient alteration of awareness: Secondary | ICD-10-CM | POA: Diagnosis not present

## 2024-08-02 DIAGNOSIS — E440 Moderate protein-calorie malnutrition: Secondary | ICD-10-CM | POA: Diagnosis not present

## 2024-08-02 DIAGNOSIS — R531 Weakness: Secondary | ICD-10-CM | POA: Diagnosis not present

## 2024-08-03 DIAGNOSIS — E440 Moderate protein-calorie malnutrition: Secondary | ICD-10-CM | POA: Diagnosis not present

## 2024-08-04 DIAGNOSIS — E440 Moderate protein-calorie malnutrition: Secondary | ICD-10-CM | POA: Diagnosis not present

## 2024-08-05 DIAGNOSIS — E440 Moderate protein-calorie malnutrition: Secondary | ICD-10-CM | POA: Diagnosis not present

## 2024-08-06 DIAGNOSIS — E440 Moderate protein-calorie malnutrition: Secondary | ICD-10-CM | POA: Diagnosis not present

## 2024-08-07 ENCOUNTER — Ambulatory Visit

## 2024-08-07 DIAGNOSIS — E440 Moderate protein-calorie malnutrition: Secondary | ICD-10-CM | POA: Diagnosis not present

## 2024-08-09 ENCOUNTER — Other Ambulatory Visit

## 2024-08-21 ENCOUNTER — Ambulatory Visit: Admitting: Sports Medicine

## 2024-08-28 DEATH — deceased

## 2024-10-14 ENCOUNTER — Ambulatory Visit: Admitting: Family Medicine
